# Patient Record
Sex: Male | Born: 1970 | Race: White | Hispanic: No | Marital: Married | Smoking: Former smoker
Health system: Southern US, Community
[De-identification: ages and names within clinical notes are randomized; demographics above are authoritative.]

## PROBLEM LIST (undated history)

## (undated) DIAGNOSIS — Z973 Presence of spectacles and contact lenses: Secondary | ICD-10-CM

## (undated) DIAGNOSIS — Z9289 Personal history of other medical treatment: Secondary | ICD-10-CM

## (undated) DIAGNOSIS — G4730 Sleep apnea, unspecified: Secondary | ICD-10-CM

## (undated) DIAGNOSIS — E119 Type 2 diabetes mellitus without complications: Secondary | ICD-10-CM

## (undated) DIAGNOSIS — C801 Malignant (primary) neoplasm, unspecified: Secondary | ICD-10-CM

## (undated) DIAGNOSIS — E785 Hyperlipidemia, unspecified: Secondary | ICD-10-CM

## (undated) DIAGNOSIS — M1990 Unspecified osteoarthritis, unspecified site: Secondary | ICD-10-CM

## (undated) DIAGNOSIS — C189 Malignant neoplasm of colon, unspecified: Secondary | ICD-10-CM

## (undated) DIAGNOSIS — D649 Anemia, unspecified: Secondary | ICD-10-CM

## (undated) HISTORY — PX: COLON SURGERY: SHX602

## (undated) HISTORY — PX: WISDOM TOOTH EXTRACTION: SHX21

## (undated) HISTORY — PX: TONSILLECTOMY: SUR1361

## (undated) HISTORY — DX: Hyperlipidemia, unspecified: E78.5

## (undated) HISTORY — PX: TESTICLE SURGERY: SHX794

## (undated) HISTORY — DX: Type 2 diabetes mellitus without complications: E11.9

---

## 2013-11-02 ENCOUNTER — Encounter: Payer: 59 | Attending: Family Medicine

## 2013-11-02 VITALS — Ht 74.0 in | Wt 259.0 lb

## 2013-11-02 DIAGNOSIS — E119 Type 2 diabetes mellitus without complications: Secondary | ICD-10-CM

## 2013-11-02 DIAGNOSIS — Z713 Dietary counseling and surveillance: Secondary | ICD-10-CM | POA: Insufficient documentation

## 2013-11-03 NOTE — Progress Notes (Signed)
Patient was seen on 11/02/13 for the first of a series of three diabetes self-management courses at the [facility].  Current HbA1c: 6.8%  The following learning objectives were met by the patient during this class:  Describe diabetes  State some common risk factors for diabetes  Defines the role of glucose and insulin  Identifies type of diabetes and pathophysiology  Describe the relationship between diabetes and cardiovascular risk  State the members of the Healthcare Team  States the rationale for glucose monitoring  State when to test glucose  State their individual Target Range  State the importance of logging glucose readings  Describe how to interpret glucose readings  Identifies A1C target  Explain the correlation between A1c and eAG values  State symptoms and treatment of high blood glucose  State symptoms and treatment of low blood glucose  Explain proper technique for glucose testing  Identifies proper sharps disposal  Handouts given during class include:  Living Well with Diabetes book  Carb Counting and Meal Planning book  Meal Plan Card  Carbohydrate guide  Meal planning worksheet  Low Sodium Flavoring Tips  The diabetes portion plate  O2P to eAG Conversion Chart  Diabetes Medications  Diabetes Recommended Care Schedule  Support Group  Diabetes Success Plan  Core Class Satisfaction Survey  Follow-Up Plan:  Attend core 2

## 2013-11-09 DIAGNOSIS — E119 Type 2 diabetes mellitus without complications: Secondary | ICD-10-CM

## 2013-11-09 NOTE — Progress Notes (Signed)
Patient was seen on 11/09/13 for the second of a series of three diabetes self-management courses at the [facility]. The following learning objectives were met by the patient during this class:   Describe the role of different macronutrients on glucose  Explain how carbohydrates affect blood glucose  State what foods contain the most carbohydrates  Demonstrate carbohydrate counting  Demonstrate how to read Nutrition Facts food label  Describe effects of various fats on heart health  Describe the importance of good nutrition for health and healthy eating strategies  Describe techniques for managing your shopping, cooking and meal planning  List strategies to follow meal plan when dining out  Describe the effects of alcohol on glucose and how to use it safely  Goals:  Follow Diabetes Meal Plan as instructed  Eat 3 meals and 2 snacks, every 3-5 hrs  Aim for carbohydrate intake to 60 grams carbohydrate/meal Aim for carbohydrate intake to 30 grams carbohydrate/snack Add lean protein foods to meals/snacks  Monitor glucose levels as instructed by your doctor   Follow-Up Plan:  Attend Core 3  Work towards following your personal food plan.  

## 2013-11-16 ENCOUNTER — Encounter: Payer: 59 | Attending: Family Medicine

## 2013-11-16 DIAGNOSIS — E119 Type 2 diabetes mellitus without complications: Secondary | ICD-10-CM | POA: Insufficient documentation

## 2013-11-16 DIAGNOSIS — Z713 Dietary counseling and surveillance: Secondary | ICD-10-CM | POA: Insufficient documentation

## 2013-11-16 NOTE — Progress Notes (Signed)
Patient was seen on 11/16/13 for the third of a series of three diabetes self-management courses at the [facility]. The following learning objectives were met by the patient during this class:    State the amount of activity recommended for healthy living   Describe activities suitable for individual needs   Identify ways to regularly incorporate activity into daily life   Identify barriers to activity and ways to over come these barriers  Identify diabetes medications being personally used and their primary action for lowering glucose and possible side effects   Describe role of stress on blood glucose and develop strategies to address psychosocial issues   Identify diabetes complications and ways to prevent them  Explain how to manage diabetes during illness   Evaluate success in meeting personal goal   Establish 2-3 goals that they will plan to diligently work on until they return for the  64-monthfollow-up visit  Goals:  Follow Diabetes Meal Plan as instructed  Aim for 15-30 mins of physical activity daily as tolerated  Bring food record and glucose log to your follow up visit  Your patient has established the following 4 month goals in their individualized success plan: I will increase my activity level by 30 minutes at least 5 days a week I will test my glucose at least 3 times a day, 7 days a week  Your patient has identified these potential barriers to change:  Money for test strips  Your patient has identified their diabetes self-care support plan as  [facility]  Wife  Plan:  Attend Core 4 in 4 months

## 2014-02-19 ENCOUNTER — Ambulatory Visit: Payer: 59

## 2014-12-13 ENCOUNTER — Other Ambulatory Visit: Payer: Self-pay | Admitting: Orthopaedic Surgery

## 2014-12-17 ENCOUNTER — Encounter (HOSPITAL_BASED_OUTPATIENT_CLINIC_OR_DEPARTMENT_OTHER): Payer: Self-pay | Admitting: *Deleted

## 2014-12-17 NOTE — Progress Notes (Signed)
No labs needed-will bring cpap-will use post op

## 2014-12-19 NOTE — H&P (Signed)
Scott Gallagher is an 44 y.o. male.   Chief Complaint:Right shoulder pain HPI: Scott Gallagher is a patient well known to our practice complaining of multiple months of right shoulder pain with activity.  He does get worse over the last couple of weeks.  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  He is now having trouble sleeping and pain with activities of daily living.  We have discussed proceeding with a shoulder arthroscopy to take care of this problem for him.  MRI scan was positive for a calcific deposit in the subscapularis  Past Medical History  Diagnosis Date  . Hyperlipidemia   . Sleep apnea     cpap  . Diabetes mellitus without complication     diet controlled  . Wears glasses     Past Surgical History  Procedure Laterality Date  . Tonsillectomy    . Testicle surgery      as teen  . Wisdom tooth extraction      Family History  Problem Relation Age of Onset  . Cancer Other   . Diabetes Other   . Hyperlipidemia Other   . Hypertension Other    Social History:  reports that he quit smoking about 2 years ago. He does not have any smokeless tobacco history on file. He reports that he drinks alcohol. He reports that he does not use illicit drugs.  Allergies:  Allergies  Allergen Reactions  . Penicillins     No prescriptions prior to admission    No results found for this or any previous visit (from the past 48 hour(s)). No results found.  Review of Systems  Musculoskeletal:       Right shoulder pain with motion  All other systems reviewed and are negative.   Height 6\' 2"  (1.88 m), weight 113.399 kg (250 lb). Physical Exam  Constitutional: He is oriented to person, place, and time. He appears well-nourished.  HENT:  Head: Atraumatic.  Eyes: EOM are normal.  Neck: Neck supple.  Cardiovascular: Normal rate.   Respiratory: Effort normal.  GI: Soft.  Musculoskeletal:  Right shoulder motion  remains full but he has severe pain with secondary impingement.  Good rotator cuff strength pain with resisted external rotation.  Sensory motor function normal DTRs 2+  Neurological: He is oriented to person, place, and time.  Skin: Skin is warm.  Psychiatric: He has a normal mood and affect.     Assessment/Plan Assessment right shoulder impingement and subscapularis calcific tendinitis injected in the past Plan: Because of his severe pain with use of right shoulder we have discussed proceeding with a arthroscopy/acromioplasty of his shoulder to reduce his pain.  Discussed the risk of anesthesia, infection with shoulder arthroscopy.  Also discussed is the need for possible postoperative physical therapy.  Nur Rabold R 12/19/2014, 12:22 PM

## 2014-12-20 ENCOUNTER — Ambulatory Visit (HOSPITAL_BASED_OUTPATIENT_CLINIC_OR_DEPARTMENT_OTHER): Payer: 59 | Admitting: Certified Registered"

## 2014-12-20 ENCOUNTER — Encounter (HOSPITAL_BASED_OUTPATIENT_CLINIC_OR_DEPARTMENT_OTHER)
Admission: RE | Disposition: A | Discharge status: Home / Self Care | Payer: Self-pay | Source: Ambulatory Visit | Attending: Orthopaedic Surgery

## 2014-12-20 ENCOUNTER — Encounter (HOSPITAL_BASED_OUTPATIENT_CLINIC_OR_DEPARTMENT_OTHER): Payer: Self-pay | Admitting: Anesthesiology

## 2014-12-20 ENCOUNTER — Ambulatory Visit (HOSPITAL_BASED_OUTPATIENT_CLINIC_OR_DEPARTMENT_OTHER)
Admission: RE | Admit: 2014-12-20 | Discharge: 2014-12-20 | Disposition: A | Discharge status: Home / Self Care | Payer: 59 | Source: Ambulatory Visit | Attending: Orthopaedic Surgery | Admitting: Orthopaedic Surgery

## 2014-12-20 DIAGNOSIS — M7531 Calcific tendinitis of right shoulder: Secondary | ICD-10-CM | POA: Diagnosis present

## 2014-12-20 DIAGNOSIS — E119 Type 2 diabetes mellitus without complications: Secondary | ICD-10-CM | POA: Diagnosis not present

## 2014-12-20 DIAGNOSIS — Z880 Allergy status to penicillin: Secondary | ICD-10-CM | POA: Diagnosis not present

## 2014-12-20 DIAGNOSIS — Z6832 Body mass index (BMI) 32.0-32.9, adult: Secondary | ICD-10-CM | POA: Insufficient documentation

## 2014-12-20 DIAGNOSIS — Z87891 Personal history of nicotine dependence: Secondary | ICD-10-CM | POA: Diagnosis not present

## 2014-12-20 DIAGNOSIS — E785 Hyperlipidemia, unspecified: Secondary | ICD-10-CM | POA: Diagnosis not present

## 2014-12-20 DIAGNOSIS — G4730 Sleep apnea, unspecified: Secondary | ICD-10-CM | POA: Diagnosis not present

## 2014-12-20 DIAGNOSIS — M7541 Impingement syndrome of right shoulder: Secondary | ICD-10-CM | POA: Diagnosis not present

## 2014-12-20 HISTORY — DX: Sleep apnea, unspecified: G47.30

## 2014-12-20 HISTORY — PX: SHOULDER ARTHROSCOPY: SHX128

## 2014-12-20 HISTORY — PX: SHOULDER ACROMIOPLASTY: SHX6093

## 2014-12-20 HISTORY — DX: Presence of spectacles and contact lenses: Z97.3

## 2014-12-20 LAB — POCT HEMOGLOBIN-HEMACUE: Hemoglobin: 14.3 g/dL (ref 13.0–17.0)

## 2014-12-20 SURGERY — ARTHROSCOPY, SHOULDER
Anesthesia: Regional | Site: Shoulder | Laterality: Right

## 2014-12-20 MED ORDER — FENTANYL CITRATE (PF) 100 MCG/2ML IJ SOLN: 25.0000 ug | INTRAMUSCULAR | Status: DC | PRN

## 2014-12-20 MED ORDER — ONDANSETRON HCL 4 MG/2ML IJ SOLN
INTRAMUSCULAR | Status: DC | PRN
  Administered 2014-12-20: 4 mg via INTRAVENOUS

## 2014-12-20 MED ORDER — MIDAZOLAM HCL 2 MG/2ML IJ SOLN
INTRAMUSCULAR | Status: AC
  Filled 2014-12-20: qty 2

## 2014-12-20 MED ORDER — LACTATED RINGERS IV SOLN
INTRAVENOUS | Status: DC
  Administered 2014-12-20: 13:00:00 via INTRAVENOUS

## 2014-12-20 MED ORDER — ACETAMINOPHEN 10 MG/ML IV SOLN
1000.0000 mg | Freq: Once | INTRAVENOUS | Status: AC
  Administered 2014-12-20: 1000 mg via INTRAVENOUS

## 2014-12-20 MED ORDER — ROPIVACAINE HCL 5 MG/ML IJ SOLN
INTRAMUSCULAR | Status: DC | PRN
  Administered 2014-12-20: 20 mL via PERINEURAL

## 2014-12-20 MED ORDER — ACETAMINOPHEN 160 MG/5ML PO SOLN: 325.0000 mg | ORAL | Status: DC | PRN

## 2014-12-20 MED ORDER — FENTANYL CITRATE (PF) 100 MCG/2ML IJ SOLN
50.0000 ug | INTRAMUSCULAR | Status: DC | PRN
  Administered 2014-12-20: 100 ug via INTRAVENOUS

## 2014-12-20 MED ORDER — MIDAZOLAM HCL 2 MG/2ML IJ SOLN
1.0000 mg | INTRAMUSCULAR | Status: DC | PRN
  Administered 2014-12-20: 2 mg via INTRAVENOUS

## 2014-12-20 MED ORDER — HYDROCODONE-ACETAMINOPHEN 5-325 MG PO TABS: 1.0000 | ORAL_TABLET | Freq: Four times a day (QID) | ORAL | Status: DC | PRN

## 2014-12-20 MED ORDER — SUCCINYLCHOLINE CHLORIDE 20 MG/ML IJ SOLN
INTRAMUSCULAR | Status: DC | PRN
  Administered 2014-12-20: 50 mg via INTRAVENOUS

## 2014-12-20 MED ORDER — LACTATED RINGERS IV SOLN
INTRAVENOUS | Status: DC
  Administered 2014-12-20: 12:00:00 via INTRAVENOUS

## 2014-12-20 MED ORDER — FENTANYL CITRATE (PF) 100 MCG/2ML IJ SOLN
INTRAMUSCULAR | Status: AC
  Filled 2014-12-20: qty 2

## 2014-12-20 MED ORDER — ACETAMINOPHEN 10 MG/ML IV SOLN
INTRAVENOUS | Status: AC
  Filled 2014-12-20: qty 100

## 2014-12-20 MED ORDER — METHYLPREDNISOLONE ACETATE 80 MG/ML IJ SUSP
INTRAMUSCULAR | Status: AC
  Filled 2014-12-20: qty 1

## 2014-12-20 MED ORDER — VANCOMYCIN HCL IN DEXTROSE 1-5 GM/200ML-% IV SOLN
INTRAVENOUS | Status: AC
  Filled 2014-12-20: qty 200

## 2014-12-20 MED ORDER — ACETAMINOPHEN 325 MG PO TABS: 325.0000 mg | ORAL_TABLET | ORAL | Status: DC | PRN

## 2014-12-20 MED ORDER — VANCOMYCIN HCL 10 G IV SOLR
1500.0000 mg | INTRAVENOUS | Status: AC
  Administered 2014-12-20: 1500 mg via INTRAVENOUS

## 2014-12-20 MED ORDER — DEXAMETHASONE SODIUM PHOSPHATE 4 MG/ML IJ SOLN
INTRAMUSCULAR | Status: DC | PRN
  Administered 2014-12-20: 10 mg via INTRAVENOUS
  Administered 2014-12-20: 4 mg via PERINEURAL

## 2014-12-20 MED ORDER — VANCOMYCIN HCL IN DEXTROSE 500-5 MG/100ML-% IV SOLN
INTRAVENOUS | Status: AC
  Filled 2014-12-20: qty 100

## 2014-12-20 MED ORDER — CHLORHEXIDINE GLUCONATE 4 % EX LIQD: 60.0000 mL | Freq: Once | CUTANEOUS | Status: DC

## 2014-12-20 MED ORDER — PROPOFOL 10 MG/ML IV BOLUS
INTRAVENOUS | Status: DC | PRN
  Administered 2014-12-20: 200 mg via INTRAVENOUS

## 2014-12-20 MED ORDER — BUPIVACAINE-EPINEPHRINE (PF) 0.5% -1:200000 IJ SOLN
INTRAMUSCULAR | Status: AC
  Filled 2014-12-20: qty 30

## 2014-12-20 MED ORDER — LIDOCAINE HCL (CARDIAC) 20 MG/ML IV SOLN
INTRAVENOUS | Status: DC | PRN
  Administered 2014-12-20: 60 mg via INTRAVENOUS

## 2014-12-20 MED ORDER — SODIUM CHLORIDE 0.9 % IR SOLN
Status: DC | PRN
  Administered 2014-12-20: 6000 mL

## 2014-12-20 SURGICAL SUPPLY — 63 items
BENZOIN TINCTURE PRP APPL 2/3 (GAUZE/BANDAGES/DRESSINGS) IMPLANT
BLADE CUDA 5.5 (BLADE) IMPLANT
BLADE GREAT WHITE 4.2 (BLADE) ×2 IMPLANT
BLADE SURG 15 STRL LF DISP TIS (BLADE) IMPLANT
BLADE SURG 15 STRL SS (BLADE)
BUR VERTEX HOODED 4.5 (BURR) ×2 IMPLANT
CANNULA SHOULDER 7CM (CANNULA) ×2 IMPLANT
CANNULA TWIST IN 8.25X7CM (CANNULA) IMPLANT
DECANTER SPIKE VIAL GLASS SM (MISCELLANEOUS) IMPLANT
DRAPE STERI 35X30 U-POUCH (DRAPES) ×2 IMPLANT
DRAPE U-SHAPE 47X51 STRL (DRAPES) ×2 IMPLANT
DRAPE U-SHAPE 76X120 STRL (DRAPES) ×4 IMPLANT
DRSG EMULSION OIL 3X3 NADH (GAUZE/BANDAGES/DRESSINGS) ×2 IMPLANT
DRSG PAD ABDOMINAL 8X10 ST (GAUZE/BANDAGES/DRESSINGS) ×2 IMPLANT
DURAPREP 26ML APPLICATOR (WOUND CARE) ×2 IMPLANT
ELECT MENISCUS 165MM 90D (ELECTRODE) IMPLANT
ELECT REM PT RETURN 9FT ADLT (ELECTROSURGICAL) ×2
ELECTRODE REM PT RTRN 9FT ADLT (ELECTROSURGICAL) ×1 IMPLANT
GAUZE SPONGE 4X4 12PLY STRL (GAUZE/BANDAGES/DRESSINGS) ×2 IMPLANT
GLOVE BIO SURGEON STRL SZ8 (GLOVE) ×4 IMPLANT
GLOVE BIOGEL M STRL SZ7.5 (GLOVE) ×2 IMPLANT
GLOVE BIOGEL PI IND STRL 8 (GLOVE) ×4 IMPLANT
GLOVE BIOGEL PI INDICATOR 8 (GLOVE) ×4
GOWN STRL REUS W/ TWL LRG LVL3 (GOWN DISPOSABLE) IMPLANT
GOWN STRL REUS W/ TWL XL LVL3 (GOWN DISPOSABLE) ×3 IMPLANT
GOWN STRL REUS W/TWL LRG LVL3 (GOWN DISPOSABLE)
GOWN STRL REUS W/TWL XL LVL3 (GOWN DISPOSABLE) ×3
LIQUID BAND (GAUZE/BANDAGES/DRESSINGS) IMPLANT
MANIFOLD NEPTUNE II (INSTRUMENTS) ×2 IMPLANT
NDL SUT 6 .5 CRC .975X.05 MAYO (NEEDLE) IMPLANT
NEEDLE MAYO TAPER (NEEDLE)
NEEDLE SCORPION MULTI FIRE (NEEDLE) IMPLANT
NS IRRIG 1000ML POUR BTL (IV SOLUTION) IMPLANT
PACK ARTHROSCOPY DSU (CUSTOM PROCEDURE TRAY) ×2 IMPLANT
PACK BASIN DAY SURGERY FS (CUSTOM PROCEDURE TRAY) ×2 IMPLANT
PASSER SUT SWANSON 36MM LOOP (INSTRUMENTS) IMPLANT
PENCIL BUTTON HOLSTER BLD 10FT (ELECTRODE) IMPLANT
SET ARTHROSCOPY TUBING (MISCELLANEOUS) ×1
SET ARTHROSCOPY TUBING LN (MISCELLANEOUS) ×1 IMPLANT
SHEET MEDIUM DRAPE 40X70 STRL (DRAPES) ×2 IMPLANT
SLEEVE SCD COMPRESS KNEE MED (MISCELLANEOUS) ×2 IMPLANT
SLING ARM LRG ADULT FOAM STRAP (SOFTGOODS) ×2 IMPLANT
SLING ARM MED ADULT FOAM STRAP (SOFTGOODS) IMPLANT
SLING ARM SM FOAM STRAP (SOFTGOODS) IMPLANT
SLING ARM XL FOAM STRAP (SOFTGOODS) IMPLANT
SPONGE LAP 4X18 X RAY DECT (DISPOSABLE) IMPLANT
STRIP CLOSURE SKIN 1/2X4 (GAUZE/BANDAGES/DRESSINGS) IMPLANT
SUCTION FRAZIER TIP 10 FR DISP (SUCTIONS) IMPLANT
SUT ETHIBOND 2 OS 4 DA (SUTURE) IMPLANT
SUT ETHILON 3 0 PS 1 (SUTURE) ×2 IMPLANT
SUT FIBERWIRE #2 38 T-5 BLUE (SUTURE)
SUT PDS AB 2-0 CT2 27 (SUTURE) IMPLANT
SUT VIC AB 0 SH 27 (SUTURE) IMPLANT
SUT VIC AB 2-0 SH 27 (SUTURE)
SUT VIC AB 2-0 SH 27XBRD (SUTURE) IMPLANT
SUT VICRYL 4-0 PS2 18IN ABS (SUTURE) IMPLANT
SUTURE FIBERWR #2 38 T-5 BLUE (SUTURE) IMPLANT
SYR BULB 3OZ (MISCELLANEOUS) IMPLANT
TOWEL OR 17X24 6PK STRL BLUE (TOWEL DISPOSABLE) ×2 IMPLANT
TOWEL OR NON WOVEN STRL DISP B (DISPOSABLE) ×2 IMPLANT
WAND STAR VAC 90 (SURGICAL WAND) ×2 IMPLANT
WATER STERILE IRR 1000ML POUR (IV SOLUTION) ×2 IMPLANT
YANKAUER SUCT BULB TIP NO VENT (SUCTIONS) IMPLANT

## 2014-12-20 NOTE — Anesthesia Postprocedure Evaluation (Signed)
  Anesthesia Post-op Note  Patient: Scott Gallagher  Procedure(s) Performed: Procedure(s): RIGHT ARTHROSCOPY SHOULDER WITH DEBRIDEMENT (Right) SHOULDER ACROMIOPLASTY (Right)  Patient Location: PACU  Anesthesia Type:General and Regional  Level of Consciousness: awake  Airway and Oxygen Therapy: Patient Spontanous Breathing  Post-op Pain: none  Post-op Assessment: Post-op Vital signs reviewed, Patient's Cardiovascular Status Stable, Respiratory Function Stable, Patent Airway, No signs of Nausea or vomiting and Pain level controlled  Post-op Vital Signs: Reviewed and stable  Last Vitals:  Filed Vitals:   12/20/14 1522  BP: 122/77  Pulse: 81  Temp:   Resp: 17    Complications: No apparent anesthesia complications

## 2014-12-20 NOTE — Transfer of Care (Signed)
Immediate Anesthesia Transfer of Care Note  Patient: Scott Gallagher  Procedure(s) Performed: Procedure(s): RIGHT ARTHROSCOPY SHOULDER WITH DEBRIDEMENT (Right) SHOULDER ACROMIOPLASTY (Right)  Patient Location: PACU  Anesthesia Type:GA combined with regional for post-op pain  Level of Consciousness: sedated and patient cooperative  Airway & Oxygen Therapy: Patient Spontanous Breathing and Patient connected to face mask oxygen  Post-op Assessment: Report given to RN and Post -op Vital signs reviewed and stable  Post vital signs: Reviewed and stable  Last Vitals:  Filed Vitals:   12/20/14 1323  BP:   Pulse: 85  Temp:   Resp: 19    Complications: No apparent anesthesia complications

## 2014-12-20 NOTE — Discharge Instructions (Signed)
°  Post Anesthesia Home Care Instructions  Activity: Get plenty of rest for the remainder of the day. A responsible adult should stay with you for 24 hours following the procedure.  For the next 24 hours, DO NOT: -Drive a car -Paediatric nurse -Drink alcoholic beverages -Take any medication unless instructed by your physician -Make any legal decisions or sign important papers.  Meals: Start with liquid foods such as gelatin or soup. Progress to regular foods as tolerated. Avoid greasy, spicy, heavy foods. If nausea and/or vomiting occur, drink only clear liquids until the nausea and/or vomiting subsides. Call your physician if vomiting continues.  Special Instructions/Symptoms: Your throat may feel dry or sore from the anesthesia or the breathing tube placed in your throat during surgery. If this causes discomfort, gargle with warm salt water. The discomfort should disappear within 24 hours.  If you had a scopolamine patch placed behind your ear for the management of post- operative nausea and/or vomiting:  1. The medication in the patch is effective for 72 hours, after which it should be removed.  Wrap patch in a tissue and discard in the trash. Wash hands thoroughly with soap and water. 2. You may remove the patch earlier than 72 hours if you experience unpleasant side effects which may include dry mouth, dizziness or visual disturbances. 3. Avoid touching the patch. Wash your hands with soap and water after contact with the patch.   Discharge Instructions After Orthopedic Procedures:  *You may feel tired and weak following your procedure. It is recommended that you limit physical activity for the next 24 hours and rest at home for the remainder of today and tomorrow. *No strenuous activity should be started without your doctor's permission.  Elevate the extremity that you had surgery on to a level above your heart. This should continue for 48 hours or as instructed by your  doctor.  If you had hand, arm or shoulder surgery you should move your fingers frequently unless otherwise instructed by your doctor.  If you had foot, knee or leg surgery you should wiggle your toes frequently unless otherwise instructed by your doctor.  Follow your doctor's exact instructions for activity at home. Use your home equipment as instructed. (Crutches, hard shoes, slings etc.)  Limit your activity as instructed by your doctor.  Report to your doctor should any of the following occur: 1. Extreme swelling of your fingers or toes. 2. Inability to wiggle your fingers or toes. 3. Coldness, pale or bluish color in your fingers or toes. 4. Loss of sensation, numbness or tingling of your fingers or toes. 5. Unusual smell or odor from under your dressing or cast. 6. Excessive bleeding or drainage from the surgical site. 7. Pain not relieved by medication your doctor has prescribed for you. 8. Cast or dressing too tight (do not get your dressing or cast wet or put anything under          your dressing or cast.)  *Do not change your dressing unless instructed by your doctor or discharge nurse. Then follow exact instructions.  *Follow labeled instructions for any medications that your doctor may have prescribed for you. *Should any questions or complications develop following your procedure, PLEASE CONTACT YOUR DOCTOR.

## 2014-12-20 NOTE — Progress Notes (Signed)
AssistedDr. Moser with right, ultrasound guided, interscalene  block. Side rails up, monitors on throughout procedure. See vital signs in flow sheet. Tolerated Procedure well.  

## 2014-12-20 NOTE — Interval H&P Note (Signed)
OK for surgery PD 

## 2014-12-20 NOTE — Op Note (Signed)
#  [id number] 

## 2014-12-20 NOTE — Anesthesia Preprocedure Evaluation (Signed)
Anesthesia Evaluation  Patient identified by MRN, date of birth, ID band Patient awake    Reviewed: Allergy & Precautions, NPO status , Patient's Chart, lab work & pertinent test results  History of Anesthesia Complications Negative for: history of anesthetic complications  Airway Mallampati: III  TM Distance: >3 FB Neck ROM: Full    Dental  (+) Teeth Intact   Pulmonary sleep apnea and Continuous Positive Airway Pressure Ventilation , former smoker,  breath sounds clear to auscultation        Cardiovascular negative cardio ROS  Rhythm:Regular     Neuro/Psych negative neurological ROS  negative psych ROS   GI/Hepatic negative GI ROS, Neg liver ROS,   Endo/Other  diabetes, Type 2Morbid obesity  Renal/GU negative Renal ROS     Musculoskeletal   Abdominal   Peds  Hematology negative hematology ROS (+)   Anesthesia Other Findings   Reproductive/Obstetrics                             Anesthesia Physical Anesthesia Plan  ASA: II  Anesthesia Plan: General and Regional   Post-op Pain Management:    Induction: Intravenous  Airway Management Planned: Oral ETT  Additional Equipment: None  Intra-op Plan:   Post-operative Plan: Extubation in OR  Informed Consent: I have reviewed the patients History and Physical, chart, labs and discussed the procedure including the risks, benefits and alternatives for the proposed anesthesia with the patient or authorized representative who has indicated his/her understanding and acceptance.   Dental advisory given  Plan Discussed with: CRNA and Surgeon  Anesthesia Plan Comments:         Anesthesia Quick Evaluation

## 2014-12-20 NOTE — Anesthesia Procedure Notes (Addendum)
Anesthesia Regional Block:  Interscalene brachial plexus block  Pre-Anesthetic Checklist: ,, timeout performed, Correct Patient, Correct Site, Correct Laterality, Correct Procedure, Correct Position, site marked, Risks and benefits discussed,  Surgical consent,  Pre-op evaluation,  At surgeon's request and post-op pain management  Laterality: Upper and Right  Prep: chloraprep       Needles:  Injection technique: Single-shot  Needle Type: Echogenic Stimulator Needle          Additional Needles:  Procedures: ultrasound guided (picture in chart) and nerve stimulator Interscalene brachial plexus block  Nerve Stimulator or Paresthesia:  Response: deltoid, 0.5 mA,   Additional Responses:   Narrative:  Injection made incrementally with aspirations every 5 mL.  Performed by: Personally  Anesthesiologist: Pavlos Yon, CHRIS  Additional Notes: H+P and labs reviewed, risks and benefits discussed with patient, procedure tolerated well without complications   Procedure Name: Intubation Performed by: Tawni Millers Pre-anesthesia Checklist: Patient identified, Emergency Drugs available, Suction available and Patient being monitored Patient Re-evaluated:Patient Re-evaluated prior to inductionOxygen Delivery Method: Circle System Utilized Preoxygenation: Pre-oxygenation with 100% oxygen Intubation Type: IV induction Ventilation: Mask ventilation without difficulty Laryngoscope Size: Mac and 3 Tube type: Oral Tube size: 8.0 mm Number of attempts: 1 Airway Equipment and Method: Stylet and Oral airway Placement Confirmation: ETT inserted through vocal cords under direct vision,  positive ETCO2 and breath sounds checked- equal and bilateral Tube secured with: Tape Dental Injury: Teeth and Oropharynx as per pre-operative assessment

## 2014-12-21 ENCOUNTER — Encounter (HOSPITAL_BASED_OUTPATIENT_CLINIC_OR_DEPARTMENT_OTHER): Payer: Self-pay | Admitting: Orthopaedic Surgery

## 2014-12-21 NOTE — Op Note (Signed)
[name], Scott Gallagher               ACCOUNT NO.:  [id number]  MEDICAL RECORD NO.:  [id number]  LOCATION:                                FACILITY:  [facility]  PHYSICIAN:  Monico Blitz. Elin Seats, M.D.DATE OF BIRTH:  [birth year]  DATE OF PROCEDURE:  12/20/2014 DATE OF DISCHARGE:  12/20/2014                              OPERATIVE REPORT   PREOPERATIVE DIAGNOSES: 1. Right shoulder calcific tendonitis. 2. Right shoulder impingement.  POSTOPERATIVE DIAGNOSES: 1. Right shoulder calcific tendonitis. 2. Right shoulder impingement.  PROCEDURE: 1. Right shoulder arthroscopic debridement. 2. Right shoulder arthroscopic acromioplasty.  ANESTHESIA:  General and block.  ATTENDING SURGEON:  Monico Blitz. Rhona Raider, M.D.  ASSISTANT:  Loni Dolly, PA-C.  INDICATION FOR PROCEDURE:  The patient is a 44 year old man we have followed for a long time  with some tear of right shoulder pain.  He initially responded to an injection, but with a recent recurrence things have not gotten better.  We know that he has a calcific deposit in his subscapularis.  He has pain which limits his ability to rest and use his arm and at this point, he is offered an arthroscopy.  Informed operative consent was obtained after discussion of possible complications including reaction to anesthesia and infection.  SUMMARY OF FINDINGS AND PROCEDURE:  Under general anesthesia and a block, a right shoulder arthroscopy was performed.  Glenohumeral joint showed no degenerative changes in the biceps tendon and rotator cuff looked benign.  In the subacromial space, he had a moderately prominent subacromial morphology addressed with an acromioplasty.  We then probed the subscapularis and found a calcific deposit which we did our best to drain through a small.  He was closed primarily and discharged home.  DESCRIPTION OF PROCEDURE:  The patient was taken to the operating suite where general anesthetic was applied without difficulty.  He  was positioned in a beach-chair position and prepped and draped in normal sterile fashion.  After the administration of preop IV Kefzol and an appropriate time-out, an arthroscopy of the right shoulder was performed through a total of 3 portals.  Findings were as noted above and procedure consisted initially of the acromioplasty.  This was done with a bur in the lateral position followed by transfer of the bur to the posterior position.  We then probed to the subscapularis and found a calcium deposit.  I made a split with a spinal needle along the same direction as the tendon fibers.  We then expressed some thick chalky substance consistent with a calcific deposit.  We milked him as much of this out as we could.  The shoulder was thoroughly irrigated followed by reapproximation of portals loosely with nylon.  Adaptic was applied followed by dry gauze and tape.  Estimated blood loss and intraoperative fluids can be obtained from anesthesia records.  DISPOSITION:  The patient was extubated in the operating room and taken to recovery room in stable condition.  Plans were for him to go home same-day and follow up in the office closely.  I will contact her by phone tonight.     Monico Blitz Rhona Raider, M.D.     PGD/MEDQ  D:  12/20/2014  T:  12/21/2014  Job:  [id number]

## 2016-05-21 ENCOUNTER — Observation Stay (HOSPITAL_COMMUNITY)
Admission: EM | Admit: 2016-05-21 | Discharge: 2016-05-24 | Disposition: A | Discharge status: Home / Self Care | Payer: 59 | Attending: Family Medicine | Admitting: Family Medicine

## 2016-05-21 ENCOUNTER — Encounter (HOSPITAL_COMMUNITY): Payer: Self-pay | Admitting: Emergency Medicine

## 2016-05-21 DIAGNOSIS — Z87891 Personal history of nicotine dependence: Secondary | ICD-10-CM | POA: Diagnosis not present

## 2016-05-21 DIAGNOSIS — E785 Hyperlipidemia, unspecified: Secondary | ICD-10-CM | POA: Diagnosis present

## 2016-05-21 DIAGNOSIS — Z79899 Other long term (current) drug therapy: Secondary | ICD-10-CM | POA: Diagnosis not present

## 2016-05-21 DIAGNOSIS — D500 Iron deficiency anemia secondary to blood loss (chronic): Secondary | ICD-10-CM | POA: Diagnosis present

## 2016-05-21 DIAGNOSIS — E119 Type 2 diabetes mellitus without complications: Secondary | ICD-10-CM

## 2016-05-21 DIAGNOSIS — D649 Anemia, unspecified: Principal | ICD-10-CM | POA: Insufficient documentation

## 2016-05-21 DIAGNOSIS — D61810 Antineoplastic chemotherapy induced pancytopenia: Secondary | ICD-10-CM | POA: Diagnosis present

## 2016-05-21 DIAGNOSIS — G4733 Obstructive sleep apnea (adult) (pediatric): Secondary | ICD-10-CM | POA: Diagnosis not present

## 2016-05-21 DIAGNOSIS — D509 Iron deficiency anemia, unspecified: Secondary | ICD-10-CM

## 2016-05-21 DIAGNOSIS — R42 Dizziness and giddiness: Secondary | ICD-10-CM | POA: Diagnosis present

## 2016-05-21 LAB — CBC
HCT: 22.2 % — ABNORMAL LOW (ref 39.0–52.0)
HEMOGLOBIN: 5.6 g/dL — AB (ref 13.0–17.0)
MCH: 16.3 pg — ABNORMAL LOW (ref 26.0–34.0)
MCHC: 25.2 g/dL — ABNORMAL LOW (ref 30.0–36.0)
MCV: 64.7 fL — AB (ref 78.0–100.0)
PLATELETS: 478 10*3/uL — AB (ref 150–400)
RBC: 3.43 MIL/uL — ABNORMAL LOW (ref 4.22–5.81)
RDW: 18.9 % — ABNORMAL HIGH (ref 11.5–15.5)
WBC: 7.4 10*3/uL (ref 4.0–10.5)

## 2016-05-21 LAB — COMPREHENSIVE METABOLIC PANEL
ALT: 17 U/L (ref 17–63)
AST: 20 U/L (ref 15–41)
Albumin: 4.7 g/dL (ref 3.5–5.0)
Alkaline Phosphatase: 60 U/L (ref 38–126)
Anion gap: 6 (ref 5–15)
BUN: 18 mg/dL (ref 6–20)
CO2: 25 mmol/L (ref 22–32)
CREATININE: 1 mg/dL (ref 0.61–1.24)
Calcium: 9.3 mg/dL (ref 8.9–10.3)
Chloride: 110 mmol/L (ref 101–111)
GFR calc non Af Amer: >60 mL/min (ref >60)
Glucose, Bld: 88 mg/dL (ref 65–99)
POTASSIUM: 3.9 mmol/L (ref 3.5–5.1)
SODIUM: 141 mmol/L (ref 135–145)
Total Bilirubin: 0.7 mg/dL (ref 0.3–1.2)
Total Protein: 7.5 g/dL (ref 6.5–8.1)

## 2016-05-21 LAB — POC OCCULT BLOOD, ED: Fecal Occult Bld: NEGATIVE

## 2016-05-21 LAB — ABO/RH: ABO/RH(D): B POS

## 2016-05-21 LAB — PREPARE RBC (CROSSMATCH)

## 2016-05-21 MED ORDER — ONDANSETRON HCL 4 MG/2ML IJ SOLN
4.0000 mg | Freq: Four times a day (QID) | INTRAMUSCULAR | Status: DC | PRN
Start: 2016-05-21 — End: 2016-05-24

## 2016-05-21 MED ORDER — ACETAMINOPHEN 325 MG PO TABS: 650.0000 mg | ORAL_TABLET | Freq: Four times a day (QID) | ORAL | Status: DC | PRN

## 2016-05-21 MED ORDER — SODIUM CHLORIDE 0.9 % IV SOLN: 10.0000 mL/h | Freq: Once | INTRAVENOUS | Status: DC

## 2016-05-21 MED ORDER — ONDANSETRON HCL 4 MG PO TABS: 4.0000 mg | ORAL_TABLET | Freq: Four times a day (QID) | ORAL | Status: DC | PRN

## 2016-05-21 MED ORDER — ADULT MULTIVITAMIN W/MINERALS CH
1.0000 | ORAL_TABLET | Freq: Every day | ORAL | Status: DC
  Administered 2016-05-22 – 2016-05-24 (×3): 1 via ORAL
  Filled 2016-05-21 (×3): qty 1

## 2016-05-21 MED ORDER — ACETAMINOPHEN 650 MG RE SUPP: 650.0000 mg | Freq: Four times a day (QID) | RECTAL | Status: DC | PRN

## 2016-05-21 MED ORDER — ATORVASTATIN CALCIUM 40 MG PO TABS
40.0000 mg | ORAL_TABLET | Freq: Every day | ORAL | Status: DC
  Administered 2016-05-22 – 2016-05-23 (×2): 40 mg via ORAL
  Filled 2016-05-21 (×2): qty 1

## 2016-05-21 NOTE — ED Triage Notes (Addendum)
Pt sent over by PCP for low Hgb. Pt was at office for routine physical. Pt denies SOB, lightheadedness or weakness. Does not appear to be abnormal. No dark tarry stools.

## 2016-05-21 NOTE — H&P (Signed)
History and Physical    Benoit Pociask [id number] DOB: Dec 30, 1970 DOA: 05/21/2016  PCP: Gara Kroner, MD Consultants:  None Patient coming from: home - lives with wife and son  Chief Complaint: sent by PCP  HPI: Scott Gallagher is a 45 y.o. male with medical history significant of DM, OSA, HLD presenting after he was sent by his PCP for anemia.  He reports that he went to see Dr. Moreen Fowler about a month ago because he was getting light-headed and could feel his heart in his head and in his neck at just the slightest thing - bending over, going up stairs.  Symptoms came on suddenly.  Dr. Moreen Fowler thought it was probably an ear infection, gave Flonase, no further evaluation.  Symptoms have continued but haven't really worsened.  They aren't bad - bad enough to go see someone but never felt like he would fall over.  Routine physical which was scheduled 6 months ago was today.  Doctor called today at 3 and told him to come to the ER.  No blood in stool.  No episodes of bleeding.  Maybe harder than usual to walk for 30 minutes at a good pace - which he attributed to not walking as often as he should.  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  Started on Celebrex 2 weeks ago - but denies abdominal pain or change in symptoms since starting it.   ED Course:  Per Dr. Laverta Baltimore: Patient resents to the emergency department for evaluation of anemia of unknown origin. He has had mild symptoms over the last month that is not acutely worsen. Repeat hemoglobin here in the emergency department is 5.6. Brown stool on exam. Sending Hemoccult now. Consented patient for blood transfusion.   Discussed case with hospitalist. PRBC ordered along with anemia labs. See critical care billing note above with patient requiring blood transfusion in the ED with symptomatic anemia. Reviewed available labs.   Review of Systems: As per HPI; otherwise 10 point review of systems reviewed and negative.   Ambulatory Status:  Ambulates  without difficulty  Past Medical History:  Diagnosis Date  . Diabetes mellitus without complication (HCC)    diet controlled  . Hyperlipidemia   . Sleep apnea    cpap  . Wears glasses     Past Surgical History:  Procedure Laterality Date  . SHOULDER ACROMIOPLASTY Right 12/20/2014   Procedure: SHOULDER ACROMIOPLASTY;  Surgeon: Melrose Nakayama, MD;  Location: [place];  Service: Orthopedics;  Laterality: Right;  . SHOULDER ARTHROSCOPY Right 12/20/2014   Procedure: RIGHT ARTHROSCOPY SHOULDER WITH DEBRIDEMENT;  Surgeon: Melrose Nakayama, MD;  Location: [place];  Service: Orthopedics;  Laterality: Right;  . TESTICLE SURGERY     as teen  . TONSILLECTOMY    . WISDOM TOOTH EXTRACTION      Social History   Social History  . Marital status: Married    Spouse name: N/A  . Number of children: N/A  . Years of education: N/A   Occupational History  . cemetery maintenance    Social History Main Topics  . Smoking status: Former Smoker    Quit date: 12/16/2012  . Smokeless tobacco: Never Used  . Alcohol use 1.8 oz/week    3 Shots of liquor per week     Comment: most days  . Drug use: No  . Sexual activity: Not on file   Other Topics Concern  . Not on file   Social History Narrative  . No  narrative on file    Allergies  Allergen Reactions  . Penicillins     Family History  Problem Relation Age of Onset  . Cancer Other   . Diabetes Other   . Hyperlipidemia Other   . Hypertension Other     Prior to Admission medications   Medication Sig Start Date End Date Taking? Authorizing Provider  aspirin EC 81 MG tablet Take 81 mg by mouth daily.   Yes Historical Provider, MD  atorvastatin (LIPITOR) 40 MG tablet Take 40 mg by mouth daily.   Yes Historical Provider, MD  celecoxib (CELEBREX) 200 MG capsule Take 200 mg by mouth daily.  04/27/16  Yes Historical Provider, MD  Multiple Vitamins-Minerals (MULTIVITAMIN ADULTS) TABS Take 1 tablet by mouth daily.    Yes Historical Provider, MD  HYDROcodone-acetaminophen (NORCO) 5-325 MG per tablet Take 1-2 tablets by mouth every 6 (six) hours as needed for moderate pain. Patient not taking: Reported on 05/21/2016 12/20/14   Loni Dolly, PA-C    Physical Exam: Vitals:   05/21/16 2035 05/21/16 2059 05/21/16 2145 05/22/16 0032  BP: 125/78 132/73 (!) 153/74 126/72  Pulse: 84 76 81 81  Resp: 17 17 16 16   Temp: 98.6 F (37 C) 98.5 F (36.9 C) 98.5 F (36.9 C) 98.4 F (36.9 C)  TempSrc: Oral Oral Oral Oral  SpO2: 100% 100% 100% 100%  Weight:   111.4 kg (245 lb 9.5 oz)   Height:   6\' 2"  (1.88 m)      General: Appears calm and comfortable and is NAD Eyes: PERRL, EOMI, normal lids, iris; +conjunctival pallor ENT:  grossly normal hearing, lips & tongue, mmm Neck:  no LAD, masses or thyromegaly Cardiovascular:  RRR, no m/r/g. No LE edema.  Respiratory:  CTA bilaterally, no w/r/r. Normal respiratory effort. Abdomen:  soft, ntnd, NABS Skin:  no rash or induration seen on limited exam Musculoskeletal:  grossly normal tone BUE/BLE, good ROM, no bony abnormality Psychiatric:  grossly normal mood and affect, speech fluent and appropriate, AOx3 Neurologic:  CN 2-12 grossly intact, moves all extremities in coordinated fashion, sensation intact  Labs on Admission: I have personally reviewed following labs and imaging studies  CBC:  Recent Labs Lab 05/21/16 1637  WBC 7.4  HGB 5.6*  HCT 22.2*  MCV 64.7*  PLT 123456*   Basic Metabolic Panel:  Recent Labs Lab 05/21/16 1637  NA 141  K 3.9  CL 110  CO2 25  GLUCOSE 88  BUN 18  CREATININE 1.00  CALCIUM 9.3   GFR: Estimated Creatinine Clearance: 122.6 mL/min (by C-G formula based on SCr of 1 mg/dL). Liver Function Tests:  Recent Labs Lab 05/21/16 1637  AST 20  ALT 17  ALKPHOS 60  BILITOT 0.7  PROT 7.5  ALBUMIN 4.7   No results for input(s): LIPASE, AMYLASE in the last 168 hours. No results for input(s): AMMONIA in the last 168  hours. Coagulation Profile: No results for input(s): INR, PROTIME in the last 168 hours. Cardiac Enzymes: No results for input(s): CKTOTAL, CKMB, CKMBINDEX, TROPONINI in the last 168 hours. BNP (last 3 results) No results for input(s): PROBNP in the last 8760 hours. HbA1C: No results for input(s): HGBA1C in the last 72 hours. CBG: No results for input(s): GLUCAP in the last 168 hours. Lipid Profile: No results for input(s): CHOL, HDL, LDLCALC, TRIG, CHOLHDL, LDLDIRECT in the last 72 hours. Thyroid Function Tests: No results for input(s): TSH, T4TOTAL, FREET4, T3FREE, THYROIDAB in the last 72 hours. Anemia Panel:  No results for input(s): VITAMINB12, FOLATE, FERRITIN, TIBC, IRON, RETICCTPCT in the last 72 hours. Urine analysis: No results found for: COLORURINE, APPEARANCEUR, LABSPEC, PHURINE, GLUCOSEU, HGBUR, BILIRUBINUR, KETONESUR, PROTEINUR, UROBILINOGEN, NITRITE, LEUKOCYTESUR  Creatinine Clearance: Estimated Creatinine Clearance: 122.6 mL/min (by C-G formula based on SCr of 1 mg/dL).  Sepsis Labs: @LABRCNTIP (procalcitonin:4,lacticidven:4) )No results found for this or any previous visit (from the past 240 hour(s)).   Radiological Exams on Admission: No results found.  EKG: Not done  Assessment/Plan Principal Problem:   Anemia Active Problems:   Diabetes mellitus type 2, diet-controlled (HCC)   OSA (obstructive sleep apnea)   Hyperlipidemia   Anemia -MCV is <80 and so this is microcytic anemia -MCV is < 70 indicating probable iron deficiency -With an MCV <70, elevated RDW, and reason for iron deficiency - iron deficiency can be presumed - but in this case, there really is not a good reason for iron deficiency -With ongoing question, will check ferritin; if <15, diagnostic of iron deficiency; if >100, very unlikely iron deficiency -If ferritin is non-diagnostic, will check transferrin saturation - the lower it is, the more likely this is iron deficiency anemia -Guaiac  negative -Transfusion initiated in ER for symptomatic anemia, 2 units PRBC -Ferritin pending, as is hemoglobin electrophoresis -Would also recommend hematology evaluation in the AM for further discussion about possible causes of his iron deficiency anemia -Hold Celebrex - although unlikely related since symptoms started before initiation of the medication and patient has no GI symptoms  DM -Diet controlled -No hyperglycemia on presentation -Will monitor without intervention for now  OSA -Continue CPAP (autoPAP)  HLD -Continue Lipitor    DVT prophylaxis: Early ambulation Code Status:  Full - confirmed with patient/family Family Communication: Wife present throughout evaluation  Disposition Plan:  Home once clinically improved Consults called: Suggest hematology in AM  Admission status: It is my clinical opinion that referral for OBSERVATION is reasonable and necessary in this patient based on the above information provided. The aforementioned taken together are felt to place the patient at high risk for further clinical deterioration. However it is anticipated that the patient may be medically stable for discharge from the hospital within 24 to 48 hours.     Karmen Bongo MD [facility]  If 7PM-7AM, please contact night-coverage [email or web address] Password [email or web address]  05/22/2016, 1:53 AM

## 2016-05-21 NOTE — ED Notes (Signed)
Called to give report x2 to 3W, awaiting callback.

## 2016-05-21 NOTE — ED Triage Notes (Signed)
PCP office called and said his Hgb was 5.4

## 2016-05-21 NOTE — ED Provider Notes (Signed)
Emergency Department Provider Note   I have reviewed the triage vital signs and the nursing notes.   HISTORY  Chief Complaint Abnormal Lab   HPI Scott Gallagher is a 45 y.o. male with PMH of DM and HLD presents to the emergency department for evaluation of abnormal lab. The patient went to his primary care physician today for routine physical exam and was found to have a very low hemoglobin. Patient states he has had some lightheadedness when bending over and shortness of breath when climbing steps which seems new over the last month. No suddenly worsening symptoms. No black or tarry stools. No gross blood in his stool or urine. A recent traumas or surgeries.   Past Medical History:  Diagnosis Date  . Diabetes mellitus without complication (HCC)    diet controlled  . Hyperlipidemia   . Sleep apnea    cpap  . Wears glasses     Patient Active Problem List   Diagnosis Date Noted  . Anemia 05/21/2016    Past Surgical History:  Procedure Laterality Date  . SHOULDER ACROMIOPLASTY Right 12/20/2014   Procedure: SHOULDER ACROMIOPLASTY;  Surgeon: Melrose Nakayama, MD;  Location: [place];  Service: Orthopedics;  Laterality: Right;  . SHOULDER ARTHROSCOPY Right 12/20/2014   Procedure: RIGHT ARTHROSCOPY SHOULDER WITH DEBRIDEMENT;  Surgeon: Melrose Nakayama, MD;  Location: [facility];  Service: Orthopedics;  Laterality: Right;  . TESTICLE SURGERY     as teen  . TONSILLECTOMY    . WISDOM TOOTH EXTRACTION        Allergies Penicillins  Family History  Problem Relation Age of Onset  . Cancer Other   . Diabetes Other   . Hyperlipidemia Other   . Hypertension Other     Social History Social History  Substance Use Topics  . Smoking status: Former Smoker    Quit date: 12/16/2012  . Smokeless tobacco: Never Used  . Alcohol use 1.8 oz/week    3 Shots of liquor per week     Comment: most days    Review of Systems  Constitutional: No fever/chills.  Positive lightheadedness on bending over.  Eyes: No visual changes. ENT: No sore throat. Cardiovascular: Denies chest pain. Respiratory: Denies shortness of breath. Gastrointestinal: No abdominal pain.  No nausea, no vomiting.  No diarrhea.  No constipation. Genitourinary: Negative for dysuria. Musculoskeletal: Negative for back pain. Skin: Negative for rash. Neurological: Negative for headaches, focal weakness or numbness.  10-point ROS otherwise negative.  ____________________________________________   PHYSICAL EXAM:  VITAL SIGNS: ED Triage Vitals [05/21/16 1610]  Enc Vitals Group     BP 136/66     Pulse Rate 103     Resp 18     Temp 98.6 F (37 C)     Temp Source Oral     SpO2 97 %   Constitutional: Alert and oriented. Well appearing and in no acute distress. Eyes: Conjunctivae are normal. PERRL. EOMI. Head: Atraumatic. Nose: No congestion/rhinnorhea. Mouth/Throat: Mucous membranes are moist.  Oropharynx non-erythematous. Neck: No stridor.   Cardiovascular: Normal rate, regular rhythm. Good peripheral circulation. Grossly normal heart sounds.   Respiratory: Normal respiratory effort.  No retractions. Lungs CTAB. Gastrointestinal: Soft and nontender. No distention. Brown stool. No gross blood or melena.  Musculoskeletal: No lower extremity tenderness nor edema. No gross deformities of extremities. Neurologic:  Normal speech and language. No gross focal neurologic deficits are appreciated.  Skin:  Skin is warm, dry and intact. No rash noted. Psychiatric: Mood and  affect are normal. Speech and behavior are normal.  ____________________________________________   LABS (all labs ordered are listed, but only abnormal results are displayed)  Labs Reviewed  CBC - Abnormal; Notable for the following:       Result Value   RBC 3.43 (*)    Hemoglobin 5.6 (*)    HCT 22.2 (*)    MCV 64.7 (*)    MCH 16.3 (*)    MCHC 25.2 (*)    RDW 18.9 (*)    Platelets 478 (*)    All  other components within normal limits  COMPREHENSIVE METABOLIC PANEL  FERRITIN  HEMOGLOBINOPATHY EVALUATION  BASIC METABOLIC PANEL  CBC  POC OCCULT BLOOD, ED  TYPE AND SCREEN  ABO/RH  PREPARE RBC (CROSSMATCH)   ____________________________________________   PROCEDURES  Procedure(s) performed:   Procedures  CRITICAL CARE Performed by: Margette Fast Total critical care time: 35 minutes Critical care time was exclusive of separately billable procedures and treating other patients. Critical care was necessary to treat or prevent imminent or life-threatening deterioration. Critical care was time spent personally by me on the following activities: development of treatment plan with patient and/or surrogate as well as nursing, discussions with consultants, evaluation of patient's response to treatment, examination of patient, obtaining history from patient or surrogate, ordering and performing treatments and interventions, ordering and review of laboratory studies, ordering and review of radiographic studies, pulse oximetry and re-evaluation of patient's condition.  Nanda Quinton, MD Emergency Medicine  ____________________________________________   INITIAL IMPRESSION / ASSESSMENT AND PLAN / ED COURSE  Pertinent labs & imaging results that were available during my care of the patient were reviewed by me and considered in my medical decision making (see chart for details).  Patient resents to the emergency department for evaluation of anemia of unknown origin. He has had mild symptoms over the last month that is not acutely worsen. Repeat hemoglobin here in the emergency department is 5.6. Brown stool on exam. Sending Hemoccult now. Consented patient for blood transfusion.   Discussed case with hospitalist. PRBC ordered along with anemia labs. See critical care billing note above with patient requiring blood transfusion in the ED with symptomatic anemia. Reviewed available labs.    ____________________________________________  FINAL CLINICAL IMPRESSION(S) / ED DIAGNOSES  Final diagnoses:  Anemia, unspecified type     MEDICATIONS GIVEN DURING THIS VISIT:  Medications  0.9 %  sodium chloride infusion (not administered)  multivitamin with minerals tablet 1 tablet (not administered)  atorvastatin (LIPITOR) tablet 40 mg (not administered)  acetaminophen (TYLENOL) tablet 650 mg (not administered)    Or  acetaminophen (TYLENOL) suppository 650 mg (not administered)  ondansetron (ZOFRAN) tablet 4 mg (not administered)    Or  ondansetron (ZOFRAN) injection 4 mg (not administered)     Note:  This document was prepared using Dragon voice recognition software and may include unintentional dictation errors.  Nanda Quinton, MD Emergency Medicine  Margette Fast, MD 05/21/16 [phone]

## 2016-05-21 NOTE — ED Notes (Signed)
Nurse will draw labs. 

## 2016-05-22 DIAGNOSIS — D509 Iron deficiency anemia, unspecified: Secondary | ICD-10-CM | POA: Diagnosis not present

## 2016-05-22 DIAGNOSIS — G4730 Sleep apnea, unspecified: Secondary | ICD-10-CM

## 2016-05-22 DIAGNOSIS — G4733 Obstructive sleep apnea (adult) (pediatric): Secondary | ICD-10-CM | POA: Diagnosis not present

## 2016-05-22 DIAGNOSIS — E119 Type 2 diabetes mellitus without complications: Secondary | ICD-10-CM | POA: Diagnosis not present

## 2016-05-22 DIAGNOSIS — E785 Hyperlipidemia, unspecified: Secondary | ICD-10-CM | POA: Diagnosis present

## 2016-05-22 DIAGNOSIS — I10 Essential (primary) hypertension: Secondary | ICD-10-CM

## 2016-05-22 DIAGNOSIS — D649 Anemia, unspecified: Secondary | ICD-10-CM

## 2016-05-22 DIAGNOSIS — D500 Iron deficiency anemia secondary to blood loss (chronic): Secondary | ICD-10-CM | POA: Diagnosis present

## 2016-05-22 LAB — CBC
HCT: 25.4 % — ABNORMAL LOW (ref 39.0–52.0)
HCT: 27.2 % — ABNORMAL LOW (ref 39.0–52.0)
Hemoglobin: 6.9 g/dL — CL (ref 13.0–17.0)
Hemoglobin: 7.5 g/dL — ABNORMAL LOW (ref 13.0–17.0)
MCH: 18.4 pg — ABNORMAL LOW (ref 26.0–34.0)
MCH: 18.6 pg — AB (ref 26.0–34.0)
MCHC: 27.2 g/dL — ABNORMAL LOW (ref 30.0–36.0)
MCHC: 27.6 g/dL — AB (ref 30.0–36.0)
MCV: 67.6 fL — AB (ref 78.0–100.0)
MCV: 67.5 fL — AB (ref 78.0–100.0)
PLATELETS: 408 10*3/uL — AB (ref 150–400)
PLATELETS: 427 10*3/uL — AB (ref 150–400)
RBC: 3.76 MIL/uL — ABNORMAL LOW (ref 4.22–5.81)
RBC: 4.03 MIL/uL — ABNORMAL LOW (ref 4.22–5.81)
RDW: 21.4 % — AB (ref 11.5–15.5)
RDW: 21.3 % — AB (ref 11.5–15.5)
WBC: 6.4 10*3/uL (ref 4.0–10.5)
WBC: 6.2 10*3/uL (ref 4.0–10.5)

## 2016-05-22 LAB — BASIC METABOLIC PANEL
Anion gap: 7 (ref 5–15)
BUN: 16 mg/dL (ref 6–20)
CALCIUM: 8.7 mg/dL — AB (ref 8.9–10.3)
CHLORIDE: 110 mmol/L (ref 101–111)
CO2: 24 mmol/L (ref 22–32)
CREATININE: 0.89 mg/dL (ref 0.61–1.24)
GFR calc non Af Amer: >60 mL/min (ref >60)
Glucose, Bld: 92 mg/dL (ref 65–99)
Potassium: 4 mmol/L (ref 3.5–5.1)
SODIUM: 141 mmol/L (ref 135–145)

## 2016-05-22 LAB — RETICULOCYTES
RBC.: 3.75 MIL/uL — AB (ref 4.22–5.81)
RETIC CT PCT: 1 % (ref 0.4–3.1)
Retic Count, Absolute: 37.5 10*3/uL (ref 19.0–186.0)

## 2016-05-22 LAB — IRON AND TIBC
IRON: 14 ug/dL — AB (ref 45–182)
Saturation Ratios: 2 % — ABNORMAL LOW (ref 17.9–39.5)
TIBC: 563 ug/dL — ABNORMAL HIGH (ref 250–450)
UIBC: 549 ug/dL

## 2016-05-22 LAB — FERRITIN
Ferritin: 2 ng/mL — ABNORMAL LOW (ref 24–336)
Ferritin: 3 ng/mL — ABNORMAL LOW (ref 24–336)

## 2016-05-22 LAB — SAVE SMEAR

## 2016-05-22 MED ORDER — SODIUM CHLORIDE 0.9 % IV SOLN
40.0000 mg | Freq: Once | INTRAVENOUS | Status: AC
  Administered 2016-05-23: 40 mg via INTRAVENOUS
  Filled 2016-05-22: qty 4

## 2016-05-22 MED ORDER — SODIUM CHLORIDE 0.9 % IV SOLN
1000.0000 mg | Freq: Once | INTRAVENOUS | Status: AC
  Administered 2016-05-23: 1000 mg via INTRAVENOUS
  Filled 2016-05-22 (×2): qty 20

## 2016-05-22 MED ORDER — SODIUM CHLORIDE 0.9 % IV SOLN
25.0000 mg | Freq: Once | INTRAVENOUS | Status: AC
  Administered 2016-05-23: 25 mg via INTRAVENOUS
  Filled 2016-05-22: qty 0.5

## 2016-05-22 MED ORDER — METHYLPREDNISOLONE SODIUM SUCC 125 MG IJ SOLR
125.0000 mg | Freq: Once | INTRAMUSCULAR | Status: AC
  Administered 2016-05-23: 125 mg via INTRAVENOUS
  Filled 2016-05-22: qty 2

## 2016-05-22 MED ORDER — SODIUM CHLORIDE 0.9 % IV SOLN: 1000.0000 mg | Freq: Once | INTRAVENOUS | Status: DC

## 2016-05-22 NOTE — Progress Notes (Signed)
Spoke with patient about using nocturnal CPAP. He declines for now. However, he states he would like for his wife to deliver his home machine in the morning for use during his hospital stay. RT will continue to follow.

## 2016-05-22 NOTE — Progress Notes (Signed)
RT placed patient on CPAP . Patient home setting is 4-15 cmH2O. Sterile water added to water chamber for humidification. Patient is tolerating well. RT will continue to monitor.

## 2016-05-22 NOTE — Consult Note (Signed)
Referral MD  Reason for Referral: Profound iron deficiency anemia   Chief Complaint  Patient presents with  . Abnormal Lab  : I got very weak.  HPI: Mr. Trabue is a very nice 45 year old white male. He has diet-controlled diabetes. He has sleep apnea. He used to smoke.  He has gotten weaker over the past month or so. He has not noted any weight loss or weight gain. He's had no obvious change in bowel or bladder habits. He's had no rashes. He's had no leg swelling.  He finally got to the point where he was almost passing out. He went to see his family doctor. Was found to be markedly anemic. He was told to go to the emergency room. He came to the emergency room. He got to the emergency room on October 5. His hemoglobin was 5.5 with a hematocrit of 22.2. His white cell count was 7.4. Platelet count was 478,000. His MCV was 65.  He has gotten 2 units of blood.  He had profound iron deficiency. His initial ferritin was 2. His corrected reticulocyte count was less than 0.5.  He has had 1 stool card that was checked for blood. It was negative.  There is history of cancer on his mother's side of the family.  He is not a vegetarian. He really does not drink. He has no occupational exposures. He used to be in [employer]. He grew up in [place]. His wife and son are also in the [employer]. I thanked him for his service to our country.  He has been taking Celebrex. He has been on a B aspirin.  We are asked to see him to try to help out with management of the anemia.    Past Medical History:  Diagnosis Date  . Diabetes mellitus without complication (HCC)    diet controlled  . Hyperlipidemia   . Sleep apnea    cpap  . Wears glasses   :  Past Surgical History:  Procedure Laterality Date  . SHOULDER ACROMIOPLASTY Right 12/20/2014   Procedure: SHOULDER ACROMIOPLASTY;  Surgeon: Melrose Nakayama, MD;  Location: [place];  Service: Orthopedics;  Laterality: Right;  . SHOULDER  ARTHROSCOPY Right 12/20/2014   Procedure: RIGHT ARTHROSCOPY SHOULDER WITH DEBRIDEMENT;  Surgeon: Melrose Nakayama, MD;  Location: [facility];  Service: Orthopedics;  Laterality: Right;  . TESTICLE SURGERY     as teen  . TONSILLECTOMY    . WISDOM TOOTH EXTRACTION    :   Current Facility-Administered Medications:  .  0.9 %  sodium chloride infusion, 10 mL/hr, Intravenous, Once, Margette Fast, MD .  acetaminophen (TYLENOL) tablet 650 mg, 650 mg, Oral, Q6H PRN **OR** acetaminophen (TYLENOL) suppository 650 mg, 650 mg, Rectal, Q6H PRN, Karmen Bongo, MD .  atorvastatin (LIPITOR) tablet 40 mg, 40 mg, Oral, Daily, Karmen Bongo, MD .  Derrill Memo ON 05/23/2016] famotidine (PEPCID) 40 mg in sodium chloride 0.9 % 50 mL IVPB, 40 mg, Intravenous, Once, Volanda Napoleon, MD .  Derrill Memo ON 05/23/2016] iron dextran complex (INFED) 1,000 mg in sodium chloride 0.9 % 500 mL IVPB, 1,000 mg, Intravenous, Once, Volanda Napoleon, MD .  Derrill Memo ON 05/23/2016] methylPREDNISolone sodium succinate (SOLU-MEDROL) 125 mg/2 mL injection 125 mg, 125 mg, Intravenous, Once, Volanda Napoleon, MD .  multivitamin with minerals tablet 1 tablet, 1 tablet, Oral, Daily, Karmen Bongo, MD, 1 tablet at 05/22/16 1010 .  ondansetron (ZOFRAN) tablet 4 mg, 4 mg, Oral, Q6H PRN **OR** ondansetron (  ZOFRAN) injection 4 mg, 4 mg, Intravenous, Q6H PRN, Karmen Bongo, MD:  . sodium chloride  10 mL/hr Intravenous Once  . atorvastatin  40 mg Oral Daily  . [START ON 05/23/2016] famotidine (PEPCID) IV  40 mg Intravenous Once  . [START ON 05/23/2016] iron dextran (INFED/DEXFERRUM) infusion  1,000 mg Intravenous Once  . [START ON 05/23/2016] methylPREDNISolone (SOLU-MEDROL) injection  125 mg Intravenous Once  . multivitamin with minerals  1 tablet Oral Daily  :  Allergies  Allergen Reactions  . Penicillins   :  Family History  Problem Relation Age of Onset  . Cancer Other   . Diabetes Other   . Hyperlipidemia Other   . Hypertension Other    :  Social History   Social History  . Marital status: Married    Spouse name: N/A  . Number of children: N/A  . Years of education: N/A   Occupational History  . cemetery maintenance    Social History Main Topics  . Smoking status: Former Smoker    Quit date: 12/16/2012  . Smokeless tobacco: Never Used  . Alcohol use 1.8 oz/week    3 Shots of liquor per week     Comment: most days  . Drug use: No  . Sexual activity: Not on file   Other Topics Concern  . Not on file   Social History Narrative  . No narrative on file  :  Pertinent items are noted in HPI.  Exam: Patient Vitals for the past 24 hrs:  BP Temp Temp src Pulse Resp SpO2 Height Weight  05/22/16 1448 117/73 98.2 F (36.8 C) Oral 74 16 97 % - -  05/22/16 0610 120/79 98.3 F (36.8 C) Oral 73 14 94 % - -  05/22/16 0244 126/72 98.7 F (37.1 C) Oral 75 14 95 % - -  05/22/16 0047 126/74 98.4 F (36.9 C) Oral 80 16 100 % - -  05/22/16 0032 126/72 98.4 F (36.9 C) Oral 81 16 100 % - -  05/21/16 2145 (!) 153/74 98.5 F (36.9 C) Oral 81 16 100 % 6\' 2"  (1.88 m) 245 lb 9.5 oz (111.4 kg)  05/21/16 2059 132/73 98.5 F (36.9 C) Oral 76 17 100 % - -  05/21/16 2035 125/78 98.6 F (37 C) Oral 84 17 100 % - -  05/21/16 2034 125/78 98.6 F (37 C) Oral 84 17 100 % - -  05/21/16 1854 142/68 98.2 F (36.8 C) Oral 102 18 98 % - -    Well-developed and well-nourished white male in no obvious distress. Head and neck exam shows no ocular or oral lesions. There are no palpable cervical or supraclavicular lymph nodes. His conjunctiva are pale. Thyroid is nonpalpable. Lungs are clear bilaterally. Cardiac exam regular rate and rhythm with no murmurs, rubs or bruits. Abdomen is soft. He has good bowel sounds. There is no fluid wave. There is no guarding or rebound tenderness. His liver edge might be palpable at the right costal margin. Spleen tip is not palpable. Extremities shows some trace edema in his legs. Skin exam shows no rashes,  ecchymoses or petechia. Neurological exam shows no focal neurological deficits.    Recent Labs  05/21/16 1637 05/22/16 0427  WBC 7.4 6.4  HGB 5.6* 6.9*  HCT 22.2* 25.4*  PLT 478* 408*    Recent Labs  05/21/16 1637 05/22/16 0427  NA 141 141  K 3.9 4.0  CL 110 110  CO2 25 24  GLUCOSE 88 92  BUN 18 16  CREATININE 1.00 0.89  CALCIUM 9.3 8.7*    Blood smear review:  Microcytic and hypochromic red blood cells. He has no nucleated red blood cells. There are no schistocytes or spherocytes. There may be couple target cells. He has no rouleau formation. White cells appear normal in morphology maturation. Has an immature myeloid or lymphoid forms. There are no hypersegmented polys. Platelets are slightly increased in number. Platelets are well granulated.  Pathology: None     Assessment and Plan:  Mr. Trierweiler is a 45 year old white male. He has profound iron deficiency anemia.  Despite the fact that the one stool was heme negative, I really have to believe that he has bleeding somewhere. Given that he is not a male, I have to worry that there is bleeding from the GI tract. Of note, I forgot to mention earlier that he has been taking Celebrex. He is on baby aspirin.  I believe that he clearly needs an upper and lower endoscopy.  I also will get a CT scan. I want to make sure that there is nothing that we are overlooking that could be a malignancy.  He definitely needs IV iron. I will give him iron and dextran as I can give more iron with the iron dextran. I talked to him about this. He understands this. He agrees to take this. I will make sure he is premedicated with Solu-Medrol and Pepcid.  Again, I had to make sure that we rule out an underlying malignancy. He can easily have a right-sided colon cancer that could cause this amount of anemia with few symptoms.  He does not need a bone marrow test from my point of view. I do not see anything on the blood smear that looks suspicious  for a bone marrow disorder.  He is very nice. I answered all the questions from he and his family.  We will follow along.  Lattie Haw, MD  Jeneen Rinks 1:5-7

## 2016-05-22 NOTE — Progress Notes (Signed)
PROGRESS NOTE  Scott Gallagher  [id number] DOB: 1970-12-01 DOA: 05/21/2016 PCP: Gara Kroner, MD  Outpatient Specialists: None  Brief Narrative: Scott Gallagher is a 45 y.o. male with medical history significant of DM, OSA, and HLD presenting after he was sent by his PCP for anemia. He reports about 1 month of light-headedness, palpitations, and decreased exercise capacity. He presented to his PCP for 6 month follow up on the day of admission, found to be severely anemic and was called and told to go to the ED. He denies any bleeding or history of anemia. Hemoglobin on arrival was 5.6 with microcytic indices, FOBT negative. Limited anemia labs were drawn and 2u PRBCs were administered overnight. Hematology has been consulted.   Assessment & Plan: Principal Problem:   Anemia Active Problems:   Diabetes mellitus type 2, diet-controlled (HCC)   OSA (obstructive sleep apnea)   Hyperlipidemia  Microcytic, hypochromic anemia: Unknown duration, symptomatic for ~ 1 month. No history of colonoscopy. +FH of anemia in mother "like sickle cell anemia" unable to further elaborate.  - Work up pending, including: ferritin, Fe, TIBC, reticulocytes, Hb electrophoresis. - Appreciate heme recommendations - Post 2u PRBCs, hgb 6.9. Recheck in the PM and transfuse if < 7.   T2DM: Diet-controlled. No HbA1c in system. Euglycemic.  - Will monitor with blood draws without intervention for now  OSA: Chronic, stable - Continue CPAP qHS  HLD: Chronic, stable. - Continue Lipitor  DVT prophylaxis: Early ambulation, no anticoagulation with anemia. Code Status: Full Family Communication: Spoke with wife at bedside. Disposition Plan: Continue observation services for work up and monitoring of anemia of unknown etiology on medical floor  Consultants:   Hematology, Dr. Marin Olp  Procedures:   2u PRBCs transfused 10/5  Antimicrobials:  None   Subjective: Pt has no complaints. Endorses same history as  provided at admission. He's a gravedigger/landscaper, former smoker, daily drinker ~ 2/day, and has no h/o bleeding or anemia. Reports no significant change since   Objective: Vitals:   05/22/16 0032 05/22/16 0047 05/22/16 0244 05/22/16 0610  BP: 126/72 126/74 126/72 120/79  Pulse: 81 80 75 73  Resp: 16 16 14 14   Temp: 98.4 F (36.9 C) 98.4 F (36.9 C) 98.7 F (37.1 C) 98.3 F (36.8 C)  TempSrc: Oral Oral Oral Oral  SpO2: 100% 100% 95% 94%  Weight:      Height:        Intake/Output Summary (Last 24 hours) at 05/22/16 0955 Last data filed at 05/22/16 [id number]  Gross per 24 hour  Intake             1337 ml  Output              800 ml  Net              537 ml   Filed Weights   05/21/16 2145  Weight: 111.4 kg (245 lb 9.5 oz)    Examination: General exam: 45 y.o. male in no distress HEENT: +conjunctival pallor, anicteric sclerae Respiratory system: Non-labored breathing. Clear to auscultation bilaterally.  Cardiovascular system: Regular rate and rhythm. No murmur, rub, or gallop. No JVD, and no pedal edema. Gastrointestinal system: Abdomen soft, non-tender, non-distended, with normoactive bowel sounds. No hepatomegaly or splenomegaly. Central nervous system: Alert and oriented. No focal neurological deficits. Extremities: Warm, no deformities Skin: Pallorous. No rashes, lesions or ulcers Psychiatry: Judgement and insight appear normal. Mood & affect appropriate.   Data Reviewed: I have personally reviewed following labs and imaging  studies  CBC:  Recent Labs Lab 05/21/16 1637 05/22/16 0427  WBC 7.4 6.4  HGB 5.6* 6.9*  HCT 22.2* 25.4*  MCV 64.7* 67.6*  PLT 478* [id number]*   Basic Metabolic Panel:  Recent Labs Lab 05/21/16 1637 05/22/16 0427  NA 141 141  K 3.9 4.0  CL 110 110  CO2 25 24  GLUCOSE 88 92  BUN 18 16  CREATININE 1.00 0.89  CALCIUM 9.3 8.7*   GFR: Estimated Creatinine Clearance: 137.7 mL/min (by C-G formula based on SCr of 0.89 mg/dL). Liver Function  Tests:  Recent Labs Lab 05/21/16 1637  AST 20  ALT 17  ALKPHOS 60  BILITOT 0.7  PROT 7.5  ALBUMIN 4.7   No results for input(s): LIPASE, AMYLASE in the last 168 hours. No results for input(s): AMMONIA in the last 168 hours. Coagulation Profile: No results for input(s): INR, PROTIME in the last 168 hours. Cardiac Enzymes: No results for input(s): CKTOTAL, CKMB, CKMBINDEX, TROPONINI in the last 168 hours. BNP (last 3 results) No results for input(s): PROBNP in the last 8760 hours. HbA1C: No results for input(s): HGBA1C in the last 72 hours. CBG: No results for input(s): GLUCAP in the last 168 hours. Lipid Profile: No results for input(s): CHOL, HDL, LDLCALC, TRIG, CHOLHDL, LDLDIRECT in the last 72 hours. Thyroid Function Tests: No results for input(s): TSH, T4TOTAL, FREET4, T3FREE, THYROIDAB in the last 72 hours. Anemia Panel:  Recent Labs  05/22/16 0427  RETICCTPCT 1.0   Urine analysis: No results found for: COLORURINE, APPEARANCEUR, LABSPEC, PHURINE, GLUCOSEU, HGBUR, BILIRUBINUR, KETONESUR, PROTEINUR, UROBILINOGEN, NITRITE, LEUKOCYTESUR Sepsis Labs: @LABRCNTIP (procalcitonin:4,lacticidven:4)  )No results found for this or any previous visit (from the past 240 hour(s)).   Radiology Studies: No results found.  Scheduled Meds: . sodium chloride  10 mL/hr Intravenous Once  . atorvastatin  40 mg Oral Daily  . multivitamin with minerals  1 tablet Oral Daily   Continuous Infusions:    LOS: 0 days   Time spent: 25 minutes.  Vance Gather, MD [facility] Pager [phone]  If 7PM-7AM, please contact night-coverage [email or web address] Password [email or web address] 05/22/2016, 9:55 AM

## 2016-05-23 ENCOUNTER — Encounter (HOSPITAL_COMMUNITY): Payer: Self-pay | Admitting: Radiology

## 2016-05-23 ENCOUNTER — Observation Stay (HOSPITAL_COMMUNITY): Payer: 59

## 2016-05-23 DIAGNOSIS — D500 Iron deficiency anemia secondary to blood loss (chronic): Secondary | ICD-10-CM | POA: Diagnosis not present

## 2016-05-23 DIAGNOSIS — G4733 Obstructive sleep apnea (adult) (pediatric): Secondary | ICD-10-CM | POA: Diagnosis not present

## 2016-05-23 DIAGNOSIS — E785 Hyperlipidemia, unspecified: Secondary | ICD-10-CM | POA: Diagnosis not present

## 2016-05-23 DIAGNOSIS — E119 Type 2 diabetes mellitus without complications: Secondary | ICD-10-CM | POA: Diagnosis not present

## 2016-05-23 LAB — BASIC METABOLIC PANEL
ANION GAP: 5 (ref 5–15)
BUN: 16 mg/dL (ref 6–20)
CALCIUM: 8.8 mg/dL — AB (ref 8.9–10.3)
CHLORIDE: 110 mmol/L (ref 101–111)
CO2: 25 mmol/L (ref 22–32)
CREATININE: 0.93 mg/dL (ref 0.61–1.24)
GFR calc Af Amer: >60 mL/min (ref >60)
GFR calc non Af Amer: >60 mL/min (ref >60)
GLUCOSE: 103 mg/dL — AB (ref 65–99)
Potassium: 4.1 mmol/L (ref 3.5–5.1)
Sodium: 140 mmol/L (ref 135–145)

## 2016-05-23 LAB — CBC
HEMATOCRIT: 27.4 % — AB (ref 39.0–52.0)
Hemoglobin: 7.5 g/dL — ABNORMAL LOW (ref 13.0–17.0)
MCH: 18.6 pg — AB (ref 26.0–34.0)
MCHC: 27.4 g/dL — ABNORMAL LOW (ref 30.0–36.0)
MCV: 68 fL — AB (ref 78.0–100.0)
Platelets: 443 10*3/uL — ABNORMAL HIGH (ref 150–400)
RBC: 4.03 MIL/uL — AB (ref 4.22–5.81)
RDW: 21.6 % — ABNORMAL HIGH (ref 11.5–15.5)
WBC: 7.1 10*3/uL (ref 4.0–10.5)

## 2016-05-23 LAB — ERYTHROPOIETIN: Erythropoietin: 422.2 m[IU]/mL — ABNORMAL HIGH (ref 2.6–18.5)

## 2016-05-23 MED ORDER — IOPAMIDOL (ISOVUE-300) INJECTION 61%
100.0000 mL | Freq: Once | INTRAVENOUS | Status: AC | PRN
  Administered 2016-05-23: 100 mL via INTRAVENOUS

## 2016-05-23 MED ORDER — IOPAMIDOL (ISOVUE-300) INJECTION 61%
30.0000 mL | Freq: Once | INTRAVENOUS | Status: AC | PRN
  Administered 2016-05-23: 30 mL via ORAL

## 2016-05-23 NOTE — Progress Notes (Signed)
Pt had no adverse reactions to first iron infusion.

## 2016-05-23 NOTE — Progress Notes (Signed)
PROGRESS NOTE  Scott Gallagher  [id number] DOB: [birth year] DOA: 05/21/2016 PCP: Gara Kroner, MD  Outpatient Specialists: None  Brief Narrative: Scott Gallagher is a 45 y.o. male with medical history significant of DM, OSA, and HLD presenting after he was sent by his PCP for anemia. He reports about 1 month of light-headedness, palpitations, and decreased exercise capacity. He presented to his PCP for 6 month follow up on the day of admission, found to be severely anemic and was called and told to go to the ED. He denies any bleeding or history of anemia. Hemoglobin on arrival was 5.6 with microcytic indices, FOBT negative. Limited anemia labs were drawn and 2u PRBCs and iron dextran was administered. Hematology has been consulted, recommends CT looking for occult malignancy.   Assessment & Plan: Principal Problem:   Anemia Active Problems:   Diabetes mellitus type 2, diet-controlled (HCC)   OSA (obstructive sleep apnea)   Hyperlipidemia   Anemia due to blood loss, chronic  Profound iron-deficiency anemia: Unknown duration, symptomatic for ~ 1 month. No history of colonoscopy. +FH of anemia in mother "like sickle cell anemia" unable to further elaborate.  - Hgb stable at 7.5. Iron dextran given this AM - Hematology following - CT chest/abd/pelvis pending. Will contact GI once this results. Pt will need endoscopy.   T2DM: Diet-controlled. No HbA1c in system. Euglycemic.  - Will monitor with blood draws without intervention for now  OSA: Chronic, stable - Continue CPAP qHS  HLD: Chronic, stable. - Continue Lipitor  DVT prophylaxis: Early ambulation, no anticoagulation with anemia. Code Status: Full Family Communication: Spoke with wife at bedside. Disposition Plan: Pending CT results and stability of anemia, may D/C in 24 hours.   Consultants:   Hematology, Dr. Marin Olp  Procedures:   2u PRBCs transfused 10/5  Antimicrobials:  None   Subjective: Pt has no  complaints. Still no bleeding, dyspnea, palpitations, light-headedness. Again denies recent weight loss, fevers, chills, night sweats.   Objective: Vitals:   05/22/16 1448 05/22/16 2100 05/23/16 0610 05/23/16 1340  BP: 117/73 124/71 118/71 130/76  Pulse: 74 79 88 80  Resp: 16 16 16 15   Temp: 98.2 F (36.8 C) 98.1 F (36.7 C) 98 F (36.7 C) 97.8 F (36.6 C)  TempSrc: Oral Oral Oral Oral  SpO2: 97% 100% 96% 94%  Weight:      Height:        Intake/Output Summary (Last 24 hours) at 05/23/16 1400 Last data filed at 05/23/16 1341  Gross per 24 hour  Intake             1440 ml  Output             3075 ml  Net            -1635 ml   Filed Weights   05/21/16 2145  Weight: 111.4 kg (245 lb 9.5 oz)    Examination: General exam: 45 y.o. male in no distress HEENT: +conjunctival pallor, anicteric sclerae Respiratory system: Non-labored breathing. Clear to auscultation bilaterally.  Cardiovascular system: Regular rate and rhythm. No murmur, rub, or gallop. No JVD, and no pedal edema. Gastrointestinal system: Abdomen soft, non-tender, non-distended, with normoactive bowel sounds. No hepatomegaly or splenomegaly. Central nervous system: Alert and oriented. No focal neurological deficits. Extremities: Warm, no deformities Skin: Overall more hue to his skin today. No rashes, lesions or ulcers Psychiatry: Judgement and insight appear normal. Mood & affect appropriate.   Data Reviewed: I have personally reviewed following labs and imaging  studies  CBC:  Recent Labs Lab 05/21/16 1637 05/22/16 0427 05/22/16 1839 05/23/16 0420  WBC 7.4 6.4 6.2 7.1  HGB 5.6* 6.9* 7.5* 7.5*  HCT 22.2* 25.4* 27.2* 27.4*  MCV 64.7* 67.6* 67.5* 68.0*  PLT 478* 408* 427* [id number]*   Basic Metabolic Panel:  Recent Labs Lab 05/21/16 1637 05/22/16 0427 05/23/16 0420  NA 141 141 140  K 3.9 4.0 4.1  CL 110 110 110  CO2 25 24 25   GLUCOSE 88 92 103*  BUN 18 16 16   CREATININE 1.00 0.89 0.93  CALCIUM 9.3  8.7* 8.8*   GFR: Estimated Creatinine Clearance: 131.8 mL/min (by C-G formula based on SCr of 0.93 mg/dL). Liver Function Tests:  Recent Labs Lab 05/21/16 1637  AST 20  ALT 17  ALKPHOS 60  BILITOT 0.7  PROT 7.5  ALBUMIN 4.7   No results for input(s): LIPASE, AMYLASE in the last 168 hours. No results for input(s): AMMONIA in the last 168 hours. Coagulation Profile: No results for input(s): INR, PROTIME in the last 168 hours. Cardiac Enzymes: No results for input(s): CKTOTAL, CKMB, CKMBINDEX, TROPONINI in the last 168 hours. BNP (last 3 results) No results for input(s): PROBNP in the last 8760 hours. HbA1C: No results for input(s): HGBA1C in the last 72 hours. CBG: No results for input(s): GLUCAP in the last 168 hours. Lipid Profile: No results for input(s): CHOL, HDL, LDLCALC, TRIG, CHOLHDL, LDLDIRECT in the last 72 hours. Thyroid Function Tests: No results for input(s): TSH, T4TOTAL, FREET4, T3FREE, THYROIDAB in the last 72 hours. Anemia Panel:  Recent Labs  05/22/16 0427 05/22/16 1005  FERRITIN 2* 3*  TIBC  --  563*  IRON  --  14*  RETICCTPCT 1.0  --    Urine analysis: No results found for: COLORURINE, APPEARANCEUR, LABSPEC, PHURINE, GLUCOSEU, HGBUR, BILIRUBINUR, KETONESUR, PROTEINUR, UROBILINOGEN, NITRITE, LEUKOCYTESUR Sepsis Labs: @LABRCNTIP (procalcitonin:4,lacticidven:4)  )No results found for this or any previous visit (from the past 240 hour(s)).   Radiology Studies: No results found.  Scheduled Meds: . sodium chloride  10 mL/hr Intravenous Once  . atorvastatin  40 mg Oral Daily  . multivitamin with minerals  1 tablet Oral Daily   Continuous Infusions:    LOS: 0 days   Time spent: 25 minutes.  Vance Gather, MD [facility] Pager [phone]  If 7PM-7AM, please contact night-coverage [email or web address] Password [email or web address] 05/23/2016, 2:00 PM

## 2016-05-23 NOTE — Progress Notes (Signed)
Pt stated that he will self administer CPAP when ready for bed.  Pt to notify RT if any assistance is required throughout the night. RT to monitor and assess as needed.  

## 2016-05-24 DIAGNOSIS — D500 Iron deficiency anemia secondary to blood loss (chronic): Secondary | ICD-10-CM | POA: Diagnosis not present

## 2016-05-24 LAB — HEMOGLOBIN AND HEMATOCRIT, BLOOD
HCT: 26.6 % — ABNORMAL LOW (ref 39.0–52.0)
Hemoglobin: 7.3 g/dL — ABNORMAL LOW (ref 13.0–17.0)

## 2016-05-24 NOTE — Discharge Summary (Signed)
Physician Discharge Summary  Azzie Claybaugh [id number] DOB: April 09, 1971 DOA: 05/21/2016  PCP: Gara Kroner, MD  Admit date: 05/21/2016 Discharge date: 05/24/2016  Admitted From: Home Disposition: Home   Recommendations for Outpatient Follow-up:  1. Follow up with Heme/Onc Dr. Marin Olp in 1-2 weeks 2. Monitor CBC.  3. Told to hold aspirin until follow up recommendations. 4. Will require GI referral for diagnostic endoscopy for presumed GI bleeding source.  Home Health: None Equipment/Devices: None Discharge Condition: Stable CODE STATUS: Full Diet recommendation: Carbohydrate-limited  Brief/Interim Summary: Alcides Malloryis a 45 y.o.malewith medical history significant of DM, OSA, and HLD presenting after he was sent by his PCP for anemia. He reports about 1 month of light-headedness, palpitations, and decreased exercise capacity. He presented to his PCP for 6 month follow up on the day of admission, found to be severely anemic and was called and told to go to the ED. He denies any bleeding or history of anemia. Hemoglobin on arrival was 5.6 with microcytic indices, FOBT negative. Anemia labs showed profound iron deficiency. 2u PRBCs and iron dextran was administered. He continued to exhibit no signs of bleeding, hemoglobin has remained stable at 7.5 > 7.5 > 7.3 following transfusion. GI was not consulted due to absence of bleeding. Hematology, Dr. Marin Olp was consulted and recommended CT chest/abdomen/pelvis which showed no evidence of malignancy. He was notified of negative results and plan to discharge. The patient will call his office for follow up in the next 1-2 weeks.   Discharge Diagnoses:  Principal Problem:   Anemia due to blood loss, chronic Active Problems:   Anemia   Diabetes mellitus type 2, diet-controlled (HCC)   OSA (obstructive sleep apnea)   Hyperlipidemia  Discharge Instructions Discharge Instructions    Call MD for:  difficulty breathing, headache or visual  disturbances    Complete by:  As directed    Call MD for:  extreme fatigue    Complete by:  As directed    Call MD for:  persistant dizziness or light-headedness    Complete by:  As directed    Discharge instructions    Complete by:  As directed    You were found to be severely anemic, the cause of which remains unknown. This was treated with a transfusion of blood, after which your hemoglobin has remained stable. You are being discharged with the following directions:  - Call Dr. Antonieta Pert office in the morning to schedule a follow up appointment - STOP taking aspirin and celebrex and all over the counter pain medications except tylenol - Report any signs of bleeding to your doctor or return for care urgently       Medication List    STOP taking these medications   aspirin EC 81 MG tablet   celecoxib 200 MG capsule Commonly known as:  CELEBREX     TAKE these medications   atorvastatin 40 MG tablet Commonly known as:  LIPITOR Take 40 mg by mouth daily.   MULTIVITAMIN ADULTS Tabs Take 1 tablet by mouth daily.      Follow-up Information    Volanda Napoleon, MD. Schedule an appointment as soon as possible for a visit in 1 week(s).   Specialty:  Oncology Contact information: [place] [address] [place], [place] .   Specialty:  Family Medicine Contact information: [address] [place] [address] [phone]          Allergies  Allergen Reactions  . Penicillins     Consultations:  Heme/onc Dr Marin Olp  Procedures/Studies: Ct Chest W Contrast  Result Date: 05/23/2016 CLINICAL DATA:  Anemia from chronic blood loss. Patient has become weaker over the last month. EXAM: CT CHEST, ABDOMEN, AND PELVIS WITH CONTRAST TECHNIQUE: Multidetector CT imaging of the chest, abdomen and pelvis was performed following the standard protocol during bolus administration of intravenous contrast. CONTRAST:   11mL ISOVUE-300 IOPAMIDOL (ISOVUE-300) INJECTION 61% COMPARISON:  None. FINDINGS: CT CHEST FINDINGS Cardiovascular: Heart is normal in size and configuration. No coronary artery calcifications. Great vessels normal in caliber. No aortic atherosclerosis. Mediastinum/Nodes: No enlarged mediastinal, hilar, or axillary lymph nodes. Thyroid gland, trachea, and esophagus demonstrate no significant findings. Lungs/Pleura: Minor subsegmental atelectasis in the right lower lobe. Lungs otherwise clear. No pleural effusion or pneumothorax. Musculoskeletal: No chest wall abnormality. No acute or significant osseous findings. CT ABDOMEN PELVIS FINDINGS Hepatobiliary: 12 mm low-density lesion in the anterior segment of the right lobe. Liver otherwise unremarkable. Normal gallbladder. No bile duct dilation. Pancreas: Unremarkable. No pancreatic ductal dilatation or surrounding inflammatory changes. Spleen: Normal in size without focal abnormality. Adrenals/Urinary Tract: No adrenal masses. Single low-density masses in each kidney measuring 13 mm, in the mid to lower pole on each side, consistent with cysts. No other renal masses, no stones and no hydronephrosis. Normal ureters. Normal bladder. Stomach/Bowel: Stomach is within normal limits. Appendix appears normal. No evidence of bowel wall thickening, distention, or inflammatory changes. Vascular/Lymphatic: Minor inferior aorta atherosclerotic calcifications. No aneurysm. No adenopathy. Reproductive: Prostate is unremarkable. Other: No abdominal wall hernia or abnormality. No abdominopelvic ascites. Musculoskeletal: Unremarkable. IMPRESSION: 1. No acute findings within the chest, abdomen or pelvis. 2. No findings to account for chronic blood loss. 3. 12 mm low-density liver lesion. This is not as low in attenuation as would be expected for a cyst. The lesion is nonspecific. This could be further assessed with liver MRI with and without contrast. 4. Single renal cyst bilaterally.  5. Minor aortic atherosclerosis. Electronically Signed   By: Lajean Manes M.D.   On: 05/23/2016 14:05   Ct Abdomen Pelvis W Contrast  Result Date: 05/23/2016 CLINICAL DATA:  Anemia from chronic blood loss. Patient has become weaker over the last month. EXAM: CT CHEST, ABDOMEN, AND PELVIS WITH CONTRAST TECHNIQUE: Multidetector CT imaging of the chest, abdomen and pelvis was performed following the standard protocol during bolus administration of intravenous contrast. CONTRAST:  169mL ISOVUE-300 IOPAMIDOL (ISOVUE-300) INJECTION 61% COMPARISON:  None. FINDINGS: CT CHEST FINDINGS Cardiovascular: Heart is normal in size and configuration. No coronary artery calcifications. Great vessels normal in caliber. No aortic atherosclerosis. Mediastinum/Nodes: No enlarged mediastinal, hilar, or axillary lymph nodes. Thyroid gland, trachea, and esophagus demonstrate no significant findings. Lungs/Pleura: Minor subsegmental atelectasis in the right lower lobe. Lungs otherwise clear. No pleural effusion or pneumothorax. Musculoskeletal: No chest wall abnormality. No acute or significant osseous findings. CT ABDOMEN PELVIS FINDINGS Hepatobiliary: 12 mm low-density lesion in the anterior segment of the right lobe. Liver otherwise unremarkable. Normal gallbladder. No bile duct dilation. Pancreas: Unremarkable. No pancreatic ductal dilatation or surrounding inflammatory changes. Spleen: Normal in size without focal abnormality. Adrenals/Urinary Tract: No adrenal masses. Single low-density masses in each kidney measuring 13 mm, in the mid to lower pole on each side, consistent with cysts. No other renal masses, no stones and no hydronephrosis. Normal ureters. Normal bladder. Stomach/Bowel: Stomach is within normal limits. Appendix appears normal. No evidence of bowel wall thickening, distention, or inflammatory changes. Vascular/Lymphatic: Minor  inferior aorta atherosclerotic calcifications. No aneurysm. No adenopathy. Reproductive:  Prostate is unremarkable. Other: No abdominal wall hernia or abnormality. No abdominopelvic ascites. Musculoskeletal: Unremarkable. IMPRESSION: 1. No acute findings within the chest, abdomen or pelvis. 2. No findings to account for chronic blood loss. 3. 12 mm low-density liver lesion. This is not as low in attenuation as would be expected for a cyst. The lesion is nonspecific. This could be further assessed with liver MRI with and without contrast. 4. Single renal cyst bilaterally. 5. Minor aortic atherosclerosis. Electronically Signed   By: Lajean Manes M.D.   On: 05/23/2016 14:05     Subjective: Pt feels well. No bleeding.   Discharge Exam: Vitals:   05/23/16 2312 05/24/16 0445  BP: 126/73 134/76  Pulse: 73 74  Resp: 20 20  Temp: 98.3 F (36.8 C) 98.1 F (36.7 C)   Vitals:   05/23/16 1340 05/23/16 2107 05/23/16 2312 05/24/16 0445  BP: 130/76  126/73 134/76  Pulse: 80  73 74  Resp: 15 18 20 20   Temp: 97.8 F (36.6 C)  98.3 F (36.8 C) 98.1 F (36.7 C)  TempSrc: Oral  Oral Oral  SpO2: 94%  95% 99%  Weight:      Height:       General: Pt is alert, awake, not in acute distress Cardiovascular: RRR, S1/S2 +, no rubs, no gallops Respiratory: CTA bilaterally, no wheezing, no rhonchi Abdominal: Soft, NT, ND, bowel sounds + Extremities: no edema, no cyanosis  The results of significant diagnostics from this hospitalization (including imaging, microbiology, ancillary and laboratory) are listed below for reference.    Microbiology: None  Labs: Basic Metabolic Panel:  Recent Labs Lab 05/21/16 1637 05/22/16 0427 05/23/16 0420  NA 141 141 140  K 3.9 4.0 4.1  CL 110 110 110  CO2 25 24 25   GLUCOSE 88 92 103*  BUN 18 16 16   CREATININE 1.00 0.89 0.93  CALCIUM 9.3 8.7* 8.8*   Liver Function Tests:  Recent Labs Lab 05/21/16 1637  AST 20  ALT 17  ALKPHOS 60  BILITOT 0.7  PROT 7.5  ALBUMIN 4.7   CBC:  Recent Labs Lab 05/21/16 1637 05/22/16 0427 05/22/16 1839  05/23/16 0420 05/24/16 0423  WBC 7.4 6.4 6.2 7.1  --   HGB 5.6* 6.9* 7.5* 7.5* 7.3*  HCT 22.2* 25.4* 27.2* 27.4* 26.6*  MCV 64.7* 67.6* 67.5* 68.0*  --   PLT 478* 408* 427* 443*  --    Anemia work up  Recent Labs  05/22/16 0427 05/22/16 1005  FERRITIN 2* 3*  TIBC  --  563*  IRON  --  14*  RETICCTPCT 1.0  --    Time coordinating discharge: Over 30 minutes  Vance Gather, MD  [facility] 05/24/2016, 1:12 PM Pager [phone]  If 7PM-7AM, please contact night-coverage [email or web address] Password [email or web address]

## 2016-05-24 NOTE — Progress Notes (Signed)
Patient discharged to home, all discharge medications and instructions reviewed and questions answered.   

## 2016-05-25 LAB — TYPE AND SCREEN
ABO/RH(D): B POS
Antibody Screen: NEGATIVE
UNIT DIVISION: 0
Unit division: 0

## 2016-05-25 LAB — HEMOGLOBINOPATHY EVALUATION
Hgb A2 Quant: 1.5 % (ref 0.7–3.1)
Hgb A: 98.5 % — ABNORMAL HIGH (ref 94.0–98.0)
Hgb C: 0 %
Hgb F Quant: 0 % (ref 0.0–2.0)
Hgb S Quant: 0 %

## 2016-05-28 ENCOUNTER — Other Ambulatory Visit: Payer: Self-pay | Admitting: *Deleted

## 2016-05-28 DIAGNOSIS — D500 Iron deficiency anemia secondary to blood loss (chronic): Secondary | ICD-10-CM

## 2016-05-29 ENCOUNTER — Other Ambulatory Visit (HOSPITAL_BASED_OUTPATIENT_CLINIC_OR_DEPARTMENT_OTHER): Payer: 59

## 2016-05-29 ENCOUNTER — Ambulatory Visit (HOSPITAL_BASED_OUTPATIENT_CLINIC_OR_DEPARTMENT_OTHER): Payer: 59 | Admitting: Hematology & Oncology

## 2016-05-29 ENCOUNTER — Encounter: Payer: Self-pay | Admitting: Hematology & Oncology

## 2016-05-29 VITALS — BP 113/69 | HR 65 | Temp 97.9°F | Resp 18 | Ht 74.0 in | Wt 246.0 lb

## 2016-05-29 DIAGNOSIS — D500 Iron deficiency anemia secondary to blood loss (chronic): Secondary | ICD-10-CM

## 2016-05-29 DIAGNOSIS — K922 Gastrointestinal hemorrhage, unspecified: Secondary | ICD-10-CM | POA: Diagnosis not present

## 2016-05-29 DIAGNOSIS — D649 Anemia, unspecified: Secondary | ICD-10-CM | POA: Diagnosis not present

## 2016-05-29 LAB — CBC WITH DIFFERENTIAL (CANCER CENTER ONLY)
BASO#: 0.1 10*3/uL (ref 0.0–0.2)
BASO%: 1.4 % (ref 0.0–2.0)
EOS%: 3.6 % (ref 0.0–7.0)
Eosinophils Absolute: 0.2 10*3/uL (ref 0.0–0.5)
HEMATOCRIT: 32 % — AB (ref 38.7–49.9)
HEMOGLOBIN: 9.1 g/dL — AB (ref 13.0–17.1)
LYMPH#: 1.7 10*3/uL (ref 0.9–3.3)
LYMPH%: 26.6 % (ref 14.0–48.0)
MCH: 21.4 pg — ABNORMAL LOW (ref 28.0–33.4)
MCHC: 28.4 g/dL — AB (ref 32.0–35.9)
MCV: 75 fL — ABNORMAL LOW (ref 82–98)
MONO#: 0.7 10*3/uL (ref 0.1–0.9)
MONO%: 11.1 % (ref 0.0–13.0)
NEUT%: 57.3 % (ref 40.0–80.0)
NEUTROS ABS: 3.7 10*3/uL (ref 1.5–6.5)
Platelets: 325 10*3/uL (ref 145–400)
RBC: 4.26 10*6/uL (ref 4.20–5.70)
RDW: 31.4 % — ABNORMAL HIGH (ref 11.1–15.7)
WBC: 6.4 10*3/uL (ref 4.0–10.0)

## 2016-05-29 LAB — COMPREHENSIVE METABOLIC PANEL
ALBUMIN: 4.1 g/dL (ref 3.5–5.0)
ALK PHOS: 72 U/L (ref 40–150)
ALT: 23 U/L (ref 0–55)
AST: 22 U/L (ref 5–34)
Anion Gap: 7 mEq/L (ref 3–11)
BILIRUBIN TOTAL: 0.47 mg/dL (ref 0.20–1.20)
BUN: 15.4 mg/dL (ref 7.0–26.0)
CALCIUM: 9.2 mg/dL (ref 8.4–10.4)
CO2: 25 mEq/L (ref 22–29)
CREATININE: 0.9 mg/dL (ref 0.7–1.3)
Chloride: 108 mEq/L (ref 98–109)
EGFR: >90 mL/min/{1.73_m2} (ref >90)
GLUCOSE: 93 mg/dL (ref 70–140)
POTASSIUM: 4.6 meq/L (ref 3.5–5.1)
Sodium: 140 mEq/L (ref 136–145)
TOTAL PROTEIN: 7 g/dL (ref 6.4–8.3)

## 2016-05-29 LAB — IRON AND TIBC
%SAT: 31 % (ref 20–55)
Iron: 131 ug/dL (ref 42–163)
TIBC: 423 ug/dL — ABNORMAL HIGH (ref 202–409)
UIBC: 292 ug/dL (ref 117–376)

## 2016-05-29 LAB — TECHNOLOGIST REVIEW CHCC SATELLITE

## 2016-05-29 LAB — FERRITIN: Ferritin: 214 ng/ml (ref 22–316)

## 2016-05-29 LAB — CHCC SATELLITE - SMEAR

## 2016-05-29 NOTE — Progress Notes (Signed)
Hematology and Oncology Follow Up Visit  Scott Gallagher [id number] 1971-07-02 45 y.o. 05/29/2016   Principle Diagnosis:   Iron deficiency anemia secondary to GI blood loss  Current Therapy:    IV iron-dose given on 05/23/2016     Interim History:  Scott Gallagher is back for his first office visit. I saw him in consultation at [facility] a week ago. He presented with profound anemia. His hemoglobin was 5.5. His MCV was 65.  He has not noted any melena or bright red blood per rectum. He had been on low-dose aspirin and Celebrex.  His iron studies showed a ferritin of 2. His iron saturation was 2%. His total iron was 14.  We did do a CT of his chest/abdomen/pelvis. No obvious abnormalities were found. There is no colonic wall thickening. No adenopathy. There are no liver lesions. He did have a 12 mm low-density liver lesion felt to be a cyst.  He received 2 units of blood. He got a dose of iron. We gave him iron and dextran. He got a dose of 1000 mg.  He currently is on oral iron.  He's not noted any melena. He does have some occasional abdominal pain. He's had no nausea or vomiting.  There's been no cough. He's had no rashes. He's had no picca.  Overall, his performance status is ECOG 0.  Medications:  Current Outpatient Prescriptions:  .  atorvastatin (LIPITOR) 40 MG tablet, Take 40 mg by mouth daily., Disp: , Rfl:  .  ferrous sulfate 325 (65 FE) MG tablet, Take 325 mg by mouth daily with breakfast., Disp: , Rfl:  .  Multiple Vitamins-Minerals (MULTIVITAMIN ADULTS) TABS, Take 1 tablet by mouth daily., Disp: , Rfl:  .  ONE TOUCH ULTRA TEST test strip, , Disp: , Rfl:   Allergies:  Allergies  Allergen Reactions  . Penicillins     Past Medical History, Surgical history, Social history, and Family History were reviewed and updated.  Review of Systems:  As above  Physical Exam:  height is 6\' 2"  (1.88 m) and weight is 246 lb (111.6 kg). His temperature is 97.9 F (36.6  C). His blood pressure is 113/69 and his pulse is 65. His respiration is 18.   Wt Readings from Last 3 Encounters:  05/29/16 246 lb (111.6 kg)  05/21/16 245 lb 9.5 oz (111.4 kg)  12/20/14 252 lb (114.3 kg)      Well-developed and well-nourished white male in no obvious distress. Head and neck exam shows no ocular or oral lesions. There are no palpable cervical or supraclavicular lymph nodes. Lungs are clear. Cardiac exam regular rate and rhythm with no murmurs, rubs or bruits. Abdomen is soft. Has good bowel sounds. There is no fluid wave. There is no palpable liver or spleen tip. Rectal exam shows brown stool that is heme positive. His prostate might be slightly enlarged but smooth. No hemorrhoids are noted. Extremities shows no clubbing, cyanosis or edema. Skin exam shows no rashes, ecchymoses or petechia.  Lab Results  Component Value Date   WBC 6.4 05/29/2016   HGB 9.1 (L) 05/29/2016   HCT 32.0 (L) 05/29/2016   MCV 75 (L) 05/29/2016   PLT 325 05/29/2016     Chemistry      Component Value Date/Time   NA 140 05/23/2016 0420   K 4.1 05/23/2016 0420   CL 110 05/23/2016 0420   CO2 25 05/23/2016 0420   BUN 16 05/23/2016 0420   CREATININE 0.93 05/23/2016 0420  Component Value Date/Time   CALCIUM 8.8 (L) 05/23/2016 0420   ALKPHOS 60 05/21/2016 1637   AST 20 05/21/2016 1637   ALT 17 05/21/2016 1637   BILITOT 0.7 05/21/2016 1637         Impression and Plan: Scott Gallagher is a 45 year old white male. He had profound iron deficiency anemia. I know that when he was in the hospital, his stool was heme negative. However, he is clearly heme positive. I realize that he is taking oral iron but this should not affect are guaiac test.  I truly believe that he needs an upper and lower endoscopy. He might have had gastritis. However, I want to make sure that there is no underlying malignancy that could be causing this.   I talked to him at length about this. I told him why I thought it  would be incredibly helpful to have the upper and lower endoscopy. I just want to make sure we are not overlooking any obvious malignancy. Even though the CAT scans were negative, these are not all that sensitive for intraluminal lesions.  We will plan to get him back in 5 weeks. I want him back before the holidays so we can make sure that his blood is moving in the right direction and that he will feel well for all the holidays.  He and his wife both served our country in the [employer]. I thanked him for this.  Volanda Napoleon, MD 10/13/201710:20 AM

## 2016-05-30 LAB — RETICULOCYTES: RETICULOCYTE COUNT: 2.6 % (ref 0.6–2.6)

## 2016-06-01 ENCOUNTER — Other Ambulatory Visit: Payer: Self-pay | Admitting: Gastroenterology

## 2016-06-01 DIAGNOSIS — R1013 Epigastric pain: Secondary | ICD-10-CM

## 2016-06-03 ENCOUNTER — Ambulatory Visit
Admission: RE | Admit: 2016-06-03 | Discharge: 2016-06-03 | Disposition: A | Discharge status: Home / Self Care | Payer: 59 | Source: Ambulatory Visit | Attending: Gastroenterology | Admitting: Gastroenterology

## 2016-06-03 DIAGNOSIS — R1013 Epigastric pain: Secondary | ICD-10-CM

## 2016-06-12 ENCOUNTER — Ambulatory Visit: Payer: Self-pay | Admitting: Surgery

## 2016-06-12 NOTE — H&P (Signed)
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 Lutricia Gallagher and then Dr. Oletta Gallagher for anemia.  He was found to have a near obstructing mass of his ascending colon that on biopsy shows at least high-grade adenomatous dysplasia although it looks like it's very suspicious both grossly and microscopically for adenocarcinoma.  He was seen in the office today, October 27 and laparoscopically or robotically assisted right hemicolectomy was discussed with him.  Because he is having pain on the right side of the need to go ahead and move this up as soon as possible.  Told him to stay on a full liquid type diet.  We'll schedule this at [place] long.  Past Medical History:  Diagnosis Date  . Diabetes mellitus without complication (HCC)    diet controlled  . Hyperlipidemia   . Sleep apnea    cpap  . Wears glasses     Past Surgical History:  Procedure Laterality Date  . SHOULDER ACROMIOPLASTY Right 12/20/2014   Procedure: SHOULDER ACROMIOPLASTY;  Surgeon: Scott Nakayama, MD;  Location: [place];  Service: Orthopedics;  Laterality: Right;  . SHOULDER ARTHROSCOPY Right 12/20/2014   Procedure: RIGHT ARTHROSCOPY SHOULDER WITH DEBRIDEMENT;  Surgeon: Scott Nakayama, MD;  Location: [place];  Service: Orthopedics;  Laterality: Right;  . TESTICLE SURGERY     as teen  . TONSILLECTOMY    . WISDOM TOOTH EXTRACTION      Current Outpatient Prescriptions  Medication Sig Dispense Refill  . atorvastatin (LIPITOR) 40 MG tablet Take 40 mg by mouth daily.    . ferrous sulfate 325 (65 FE) MG tablet Take 325 mg by mouth daily with breakfast.    . Multiple Vitamins-Minerals (MULTIVITAMIN ADULTS) TABS Take 1 tablet by mouth daily.    . ONE TOUCH ULTRA TEST test strip      No current facility-administered medications for this visit.    Penicillins Family History  Problem  Relation Age of Onset  . Cancer Other   . Diabetes Other   . Hyperlipidemia Other   . Hypertension Other    Social History:   reports that he quit smoking about 3 years ago. He has never used smokeless tobacco. He reports that he drinks about 1.8 oz of alcohol per week . He reports that he does not use drugs.   REVIEW OF SYSTEMS : Negative except for C problem list  Physical Exam:   There were no vitals taken for this visit. There is no height or weight on file to calculate BMI.  Gen:  WDWN white male NAD  Neurological: Alert and oriented to person, place, and time. Motor and sensory function is grossly intact  Head: Normocephalic and atraumatic.  Eyes: Conjunctivae are normal. Pupils are equal, round, and reactive to light. No scleral icterus.  Neck: Normal range of motion. Neck supple. No tracheal deviation or thyromegaly present.  Cardiovascular:  SR without murmurs or gallops.  No carotid bruits Breast:  Not examined Respiratory: Effort normal.  No respiratory distress. No chest wall tenderness. Breath sounds normal.  No wheezes, rales or rhonchi.  Abdomen:  Mildly obese.  No overt PALPABLE masses noted. GU:  unremarkable Musculoskeletal: Normal range of motion. Extremities are nontender. No cyanosis, edema or clubbing noted Lymphadenopathy: No cervical, preauricular, postauricular or axillary adenopathy is present Skin: Skin is warm and dry. No rash noted. No diaphoresis.  No erythema. No pallor. Pscyh: Normal mood and affect. Behavior is normal. Judgment and thought content normal.   LABORATORY RESULTS: No results found for this or any previous visit (from the past 48 hour(s)).   RADIOLOGY RESULTS: No results found.  Problem List: Patient Active Problem List   Diagnosis Date Noted  . Chronic GI bleeding 05/29/2016  . Diabetes mellitus type 2, diet-controlled ([place]) 05/22/2016  . OSA (obstructive sleep apnea) 05/22/2016  . Hyperlipidemia 05/22/2016  . Anemia due to  blood loss, chronic   . 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  Plan laparoscopically or robotically assisted partial colectomy.  I've explained the procedure to him and his wife in some detail.  I would like to get this Gallagher as soon as possible.  He is having some pain on the right side.    Scott B. Hassell Done, MD, [facility] [phone] beeper [phone]  06/12/2016 2:29 PM

## 2016-06-15 NOTE — Patient Instructions (Addendum)
Scott Gallagher  06/15/2016   Your procedure is scheduled on: Wednesday 06/17/2016  Report to [facility] Main  Entrance take [place]  elevators to 3rd floor to  [place] at  [place] AM.  Call this number if you have problems the morning of surgery [phone]   Remember: ONLY 1 PERSON MAY GO WITH YOU TO SHORT STAY TO GET  READY MORNING OF [place].               FOLLOW BOWEL PREP INSTRUCTIONS FROM DR. MARTIN AND  CLEAR LIQUID DIET ALL DAY TODAY Tuesday 06/16/2016 TIL MIDNIGHT!              SEE CLEAR LIQUID LIST BELOW!    Do not eat food or drink liquids :After Midnight.      Take these medicines the morning of surgery with A SIP OF WATER: NONE                PLEASE BRING YOUR CPAP MASK AND TUBING WITH YOU TO THE HOSPITAL MORNING OF SURGERY!                                 You may not have any metal on your body including hair pins and              piercings  Do not wear jewelry, make-up, lotions, powders or perfumes, deodorant             Do not wear nail polish.  Do not shave  48 hours prior to surgery.              Men may shave face and neck.   Do not bring valuables to the hospital. [place].  Contacts, dentures or bridgework may not be worn into surgery.  Leave suitcase in the car. After surgery it may be brought to your room.                  Please read over the following fact sheets you were given: _____________________________________________________________________             [facility] - Preparing for Surgery Before surgery, you can play an important role.  Because skin is not sterile, your skin needs to be as free of germs as possible.  You can reduce the number of germs on your skin by washing with CHG (chlorahexidine gluconate) soap before surgery.  CHG is an antiseptic cleaner which kills germs and bonds with the skin to continue killing germs even after washing. Please DO  NOT use if you have an allergy to CHG or antibacterial soaps.  If your skin becomes reddened/irritated stop using the CHG and inform your nurse when you arrive at Short Stay. Do not shave (including legs and underarms) for at least 48 hours prior to the first CHG shower.  You may shave your face/neck. Please follow these instructions carefully:  1.  Shower with CHG Soap the night before surgery and the  morning of Surgery.  2.  If you choose to wash your hair, wash your hair first as usual with your  normal  shampoo.  3.  After you shampoo, rinse your hair and body thoroughly to remove the  shampoo.                           4.  Use CHG as you would any other liquid soap.  You can apply chg directly  to the skin and wash                       Gently with a scrungie or clean washcloth.  5.  Apply the CHG Soap to your body ONLY FROM THE NECK DOWN.   Do not use on face/ open                           Wound or open sores. Avoid contact with eyes, ears mouth and genitals (private parts).                       Wash face,  Genitals (private parts) with your normal soap.             6.  Wash thoroughly, paying special attention to the area where your surgery  will be performed.  7.  Thoroughly rinse your body with warm water from the neck down.  8.  DO NOT shower/wash with your normal soap after using and rinsing off  the CHG Soap.                9.  Pat yourself dry with a clean towel.            10.  Wear clean pajamas.            11.  Place clean sheets on your bed the night of your first shower and do not  sleep with pets. Day of Surgery : Do not apply any lotions/deodorants the morning of surgery.  Please wear clean clothes to the hospital/surgery center.  FAILURE TO FOLLOW THESE INSTRUCTIONS MAY RESULT IN THE CANCELLATION OF YOUR SURGERY PATIENT SIGNATURE_________________________________  NURSE  SIGNATURE__________________________________  ________________________________________________________________________    CLEAR LIQUID DIET   --------may have all day til midnight!   Foods Allowed                                                                     Foods Excluded  Coffee and tea, regular and decaf  ( NO creamer )                         liquids that you cannot  Plain Jell-O in any flavor                                             see through such as: Fruit ices (not with fruit pulp)                                     milk, soups, orange juice  Iced Popsicles  All solid food Carbonated beverages, regular and diet                                    Cranberry, grape and apple juices Sports drinks like Gatorade Lightly seasoned clear broth or consume(fat free) Sugar, honey syrup  Sample Menu Breakfast                                Lunch                                     Supper Cranberry juice                    Beef broth                            Chicken broth Jell-O                                     Grape juice                           Apple juice Coffee or tea                        Jell-O                                      Popsicle                                                Coffee or tea                        Coffee or tea  _____________________________________________________________________

## 2016-06-16 ENCOUNTER — Encounter: Payer: Self-pay | Admitting: Hematology & Oncology

## 2016-06-16 ENCOUNTER — Encounter (HOSPITAL_COMMUNITY): Payer: Self-pay

## 2016-06-16 ENCOUNTER — Encounter (HOSPITAL_COMMUNITY)
Admission: RE | Admit: 2016-06-16 | Discharge: 2016-06-16 | Disposition: A | Discharge status: Home / Self Care | Payer: 59 | Source: Ambulatory Visit | Attending: Surgery | Admitting: Surgery

## 2016-06-16 DIAGNOSIS — Z01818 Encounter for other preprocedural examination: Secondary | ICD-10-CM | POA: Insufficient documentation

## 2016-06-16 DIAGNOSIS — C787 Secondary malignant neoplasm of liver and intrahepatic bile duct: Secondary | ICD-10-CM | POA: Insufficient documentation

## 2016-06-16 DIAGNOSIS — C182 Malignant neoplasm of ascending colon: Secondary | ICD-10-CM

## 2016-06-16 LAB — CBC
HEMATOCRIT: 36.4 % — AB (ref 39.0–52.0)
HEMOGLOBIN: 10.6 g/dL — AB (ref 13.0–17.0)
MCH: 22.8 pg — AB (ref 26.0–34.0)
MCHC: 29.1 g/dL — AB (ref 30.0–36.0)
MCV: 78.4 fL (ref 78.0–100.0)
Platelets: 449 10*3/uL — ABNORMAL HIGH (ref 150–400)
RBC: 4.64 MIL/uL (ref 4.22–5.81)
WBC: 6.6 10*3/uL (ref 4.0–10.5)

## 2016-06-16 LAB — GLUCOSE, CAPILLARY: Glucose-Capillary: 99 mg/dL (ref 65–99)

## 2016-06-16 NOTE — Progress Notes (Signed)
06/08/2016- noted in EPIC- CT chest w/contrast. 05/29/2016- noted in EPIC-CBC w/diff., CMP,Iron and TIBC, Ferritin.

## 2016-06-16 NOTE — Anesthesia Preprocedure Evaluation (Addendum)
Anesthesia Evaluation  Patient identified by MRN, date of birth, ID band Patient awake    Reviewed: Allergy & Precautions, NPO status , Patient's Chart, lab work & pertinent test results  History of Anesthesia Complications Negative for: history of anesthetic complications  Airway Mallampati: III  TM Distance: >3 FB Neck ROM: Full    Dental no notable dental hx. (+) Teeth Intact, Dental Advisory Given   Pulmonary sleep apnea and Continuous Positive Airway Pressure Ventilation , former smoker,    Pulmonary exam normal        Cardiovascular negative cardio ROS Normal cardiovascular exam     Neuro/Psych negative neurological ROS  negative psych ROS   GI/Hepatic negative GI ROS, Neg liver ROS,   Endo/Other  diabetes, Type 2Morbid obesity  Renal/GU negative Renal ROS     Musculoskeletal   Abdominal   Peds  Hematology negative hematology ROS (+)   Anesthesia Other Findings   Reproductive/Obstetrics                            Anesthesia Physical  Anesthesia Plan  ASA: II  Anesthesia Plan: General   Post-op Pain Management:    Induction: Intravenous  Airway Management Planned: Oral ETT  Additional Equipment: None  Intra-op Plan:   Post-operative Plan: Extubation in OR  Informed Consent: I have reviewed the patients History and Physical, chart, labs and discussed the procedure including the risks, benefits and alternatives for the proposed anesthesia with the patient or authorized representative who has indicated his/her understanding and acceptance.   Dental advisory given  Plan Discussed with: CRNA, Anesthesiologist and Surgeon  Anesthesia Plan Comments: (ERAS protocol)      Anesthesia Quick Evaluation

## 2016-06-17 ENCOUNTER — Inpatient Hospital Stay (HOSPITAL_COMMUNITY): Payer: 59 | Admitting: Anesthesiology

## 2016-06-17 ENCOUNTER — Inpatient Hospital Stay (HOSPITAL_COMMUNITY)
Admission: RE | Admit: 2016-06-17 | Discharge: 2016-06-22 | DRG: 330 | Disposition: A | Discharge status: Home / Self Care | Payer: 59 | Source: Ambulatory Visit | Attending: Surgery | Admitting: Surgery

## 2016-06-17 ENCOUNTER — Encounter (HOSPITAL_COMMUNITY): Payer: Self-pay | Admitting: Certified Registered"

## 2016-06-17 ENCOUNTER — Encounter (HOSPITAL_COMMUNITY)
Admission: RE | Disposition: A | Discharge status: Home / Self Care | Payer: Self-pay | Source: Ambulatory Visit | Attending: Surgery

## 2016-06-17 DIAGNOSIS — G4730 Sleep apnea, unspecified: Secondary | ICD-10-CM | POA: Diagnosis present

## 2016-06-17 DIAGNOSIS — E669 Obesity, unspecified: Secondary | ICD-10-CM | POA: Diagnosis present

## 2016-06-17 DIAGNOSIS — C787 Secondary malignant neoplasm of liver and intrahepatic bile duct: Secondary | ICD-10-CM | POA: Diagnosis present

## 2016-06-17 DIAGNOSIS — C182 Malignant neoplasm of ascending colon: Secondary | ICD-10-CM | POA: Diagnosis present

## 2016-06-17 DIAGNOSIS — Z8249 Family history of ischemic heart disease and other diseases of the circulatory system: Secondary | ICD-10-CM | POA: Diagnosis not present

## 2016-06-17 DIAGNOSIS — Z6831 Body mass index (BMI) 31.0-31.9, adult: Secondary | ICD-10-CM

## 2016-06-17 DIAGNOSIS — E785 Hyperlipidemia, unspecified: Secondary | ICD-10-CM | POA: Diagnosis present

## 2016-06-17 DIAGNOSIS — Z833 Family history of diabetes mellitus: Secondary | ICD-10-CM | POA: Diagnosis not present

## 2016-06-17 DIAGNOSIS — C189 Malignant neoplasm of colon, unspecified: Secondary | ICD-10-CM | POA: Diagnosis present

## 2016-06-17 DIAGNOSIS — E119 Type 2 diabetes mellitus without complications: Secondary | ICD-10-CM | POA: Diagnosis present

## 2016-06-17 DIAGNOSIS — Z87891 Personal history of nicotine dependence: Secondary | ICD-10-CM

## 2016-06-17 HISTORY — PX: LAPAROSCOPIC RIGHT HEMI COLECTOMY: SHX5926

## 2016-06-17 LAB — CBC
HEMATOCRIT: 36.4 % — AB (ref 39.0–52.0)
HEMOGLOBIN: 10.9 g/dL — AB (ref 13.0–17.0)
MCH: 23.3 pg — ABNORMAL LOW (ref 26.0–34.0)
MCHC: 29.9 g/dL — AB (ref 30.0–36.0)
MCV: 77.9 fL — ABNORMAL LOW (ref 78.0–100.0)
Platelets: 446 10*3/uL — ABNORMAL HIGH (ref 150–400)
RBC: 4.67 MIL/uL (ref 4.22–5.81)
WBC: 16.7 10*3/uL — AB (ref 4.0–10.5)

## 2016-06-17 LAB — TYPE AND SCREEN
ABO/RH(D): B POS
Antibody Screen: NEGATIVE

## 2016-06-17 LAB — CREATININE, SERUM: Creatinine, Ser: 1.29 mg/dL — ABNORMAL HIGH (ref 0.61–1.24)

## 2016-06-17 LAB — HEMOGLOBIN A1C
Hgb A1c MFr Bld: 4.5 % — ABNORMAL LOW (ref 4.8–5.6)
Mean Plasma Glucose: 82 mg/dL

## 2016-06-17 LAB — GLUCOSE, CAPILLARY
GLUCOSE-CAPILLARY: 98 mg/dL (ref 65–99)
Glucose-Capillary: 119 mg/dL — ABNORMAL HIGH (ref 65–99)

## 2016-06-17 SURGERY — LAPAROSCOPIC RIGHT HEMI COLECTOMY
Anesthesia: General | Site: Abdomen | Laterality: Right

## 2016-06-17 MED ORDER — ONDANSETRON HCL 4 MG/2ML IJ SOLN
INTRAMUSCULAR | Status: DC | PRN
  Administered 2016-06-17: 4 mg via INTRAVENOUS

## 2016-06-17 MED ORDER — ACETAMINOPHEN 500 MG PO TABS
1000.0000 mg | ORAL_TABLET | ORAL | Status: AC
  Administered 2016-06-17: 1000 mg via ORAL
  Filled 2016-06-17: qty 2

## 2016-06-17 MED ORDER — ALVIMOPAN 12 MG PO CAPS
12.0000 mg | ORAL_CAPSULE | Freq: Once | ORAL | Status: AC
  Administered 2016-06-17: 12 mg via ORAL
  Filled 2016-06-17: qty 1

## 2016-06-17 MED ORDER — ONDANSETRON HCL 4 MG/2ML IJ SOLN: 4.0000 mg | Freq: Four times a day (QID) | INTRAMUSCULAR | Status: DC | PRN

## 2016-06-17 MED ORDER — DEXTROSE 5 % IV SOLN
2.0000 g | INTRAVENOUS | Status: AC
  Administered 2016-06-17: 2 g via INTRAVENOUS
  Filled 2016-06-17: qty 2

## 2016-06-17 MED ORDER — LACTATED RINGERS IV SOLN
INTRAVENOUS | Status: DC | PRN
  Administered 2016-06-17: 08:00:00 via INTRAVENOUS

## 2016-06-17 MED ORDER — LIDOCAINE 2% (20 MG/ML) 5 ML SYRINGE
INTRAMUSCULAR | Status: AC
  Filled 2016-06-17: qty 5

## 2016-06-17 MED ORDER — DEXAMETHASONE SODIUM PHOSPHATE 10 MG/ML IJ SOLN
INTRAMUSCULAR | Status: DC | PRN
  Administered 2016-06-17: 8 mg via INTRAVENOUS

## 2016-06-17 MED ORDER — CEFOTETAN DISODIUM-DEXTROSE 2-2.08 GM-% IV SOLR
INTRAVENOUS | Status: AC
  Filled 2016-06-17: qty 50

## 2016-06-17 MED ORDER — LIDOCAINE 2% (20 MG/ML) 5 ML SYRINGE
INTRAMUSCULAR | Status: AC
Start: 2016-06-17 — End: 2016-06-17
  Filled 2016-06-17: qty 5

## 2016-06-17 MED ORDER — FENTANYL CITRATE (PF) 100 MCG/2ML IJ SOLN
INTRAMUSCULAR | Status: DC | PRN
  Administered 2016-06-17: 100 ug via INTRAVENOUS
  Administered 2016-06-17 (×3): 50 ug via INTRAVENOUS

## 2016-06-17 MED ORDER — SUCCINYLCHOLINE CHLORIDE 20 MG/ML IJ SOLN
INTRAMUSCULAR | Status: AC
Start: 2016-06-17 — End: 2016-06-17
  Filled 2016-06-17: qty 1

## 2016-06-17 MED ORDER — ALBUMIN HUMAN 5 % IV SOLN
INTRAVENOUS | Status: DC | PRN
  Administered 2016-06-17: 10:00:00 via INTRAVENOUS

## 2016-06-17 MED ORDER — LACTATED RINGERS IR SOLN
Status: DC | PRN
  Administered 2016-06-17: 1000 mL

## 2016-06-17 MED ORDER — GABAPENTIN 300 MG PO CAPS
300.0000 mg | ORAL_CAPSULE | ORAL | Status: AC
  Administered 2016-06-17: 300 mg via ORAL
  Filled 2016-06-17: qty 1

## 2016-06-17 MED ORDER — SCOPOLAMINE 1 MG/3DAYS TD PT72
MEDICATED_PATCH | TRANSDERMAL | Status: DC | PRN
  Administered 2016-06-17: 1 via TRANSDERMAL

## 2016-06-17 MED ORDER — DIPHENHYDRAMINE HCL 50 MG/ML IJ SOLN
INTRAMUSCULAR | Status: AC
  Filled 2016-06-17: qty 1

## 2016-06-17 MED ORDER — KCL IN DEXTROSE-NACL 20-5-0.45 MEQ/L-%-% IV SOLN
INTRAVENOUS | Status: DC
  Administered 2016-06-17 – 2016-06-22 (×9): via INTRAVENOUS
  Filled 2016-06-17 (×11): qty 1000

## 2016-06-17 MED ORDER — HYDROMORPHONE HCL 2 MG/ML IJ SOLN
INTRAMUSCULAR | Status: AC
  Filled 2016-06-17: qty 1

## 2016-06-17 MED ORDER — FENTANYL CITRATE (PF) 250 MCG/5ML IJ SOLN
INTRAMUSCULAR | Status: AC
  Filled 2016-06-17: qty 5

## 2016-06-17 MED ORDER — SCOPOLAMINE 1 MG/3DAYS TD PT72
MEDICATED_PATCH | TRANSDERMAL | Status: AC
  Filled 2016-06-17: qty 1

## 2016-06-17 MED ORDER — ORAL CARE MOUTH RINSE
15.0000 mL | Freq: Two times a day (BID) | OROMUCOSAL | Status: DC
  Administered 2016-06-17 – 2016-06-20 (×5): 15 mL via OROMUCOSAL

## 2016-06-17 MED ORDER — DIPHENHYDRAMINE HCL 50 MG/ML IJ SOLN
INTRAMUSCULAR | Status: DC | PRN
  Administered 2016-06-17: 25 mg via INTRAVENOUS

## 2016-06-17 MED ORDER — LACTATED RINGERS IV SOLN: INTRAVENOUS | Status: DC

## 2016-06-17 MED ORDER — HYDROMORPHONE HCL 1 MG/ML IJ SOLN: 0.2500 mg | INTRAMUSCULAR | Status: DC | PRN

## 2016-06-17 MED ORDER — SUGAMMADEX SODIUM 200 MG/2ML IV SOLN
INTRAVENOUS | Status: AC
  Filled 2016-06-17: qty 2

## 2016-06-17 MED ORDER — ROCURONIUM BROMIDE 50 MG/5ML IV SOSY
PREFILLED_SYRINGE | INTRAVENOUS | Status: AC
  Filled 2016-06-17: qty 5

## 2016-06-17 MED ORDER — SODIUM CHLORIDE 0.9 % IJ SOLN
INTRAMUSCULAR | Status: AC
  Filled 2016-06-17: qty 50

## 2016-06-17 MED ORDER — ONDANSETRON HCL 4 MG/2ML IJ SOLN
INTRAMUSCULAR | Status: AC
  Filled 2016-06-17: qty 2

## 2016-06-17 MED ORDER — SODIUM CHLORIDE 0.9 % IJ SOLN
INTRAMUSCULAR | Status: DC | PRN
  Administered 2016-06-17: 20 mL

## 2016-06-17 MED ORDER — BUPIVACAINE LIPOSOME 1.3 % IJ SUSP
20.0000 mL | Freq: Once | INTRAMUSCULAR | Status: AC
  Administered 2016-06-17: 20 mL
  Filled 2016-06-17: qty 20

## 2016-06-17 MED ORDER — HEPARIN SODIUM (PORCINE) 5000 UNIT/ML IJ SOLN
5000.0000 [IU] | Freq: Three times a day (TID) | INTRAMUSCULAR | Status: DC
  Administered 2016-06-17 – 2016-06-22 (×14): 5000 [IU] via SUBCUTANEOUS
  Filled 2016-06-17 (×12): qty 1

## 2016-06-17 MED ORDER — PROPOFOL 10 MG/ML IV BOLUS
INTRAVENOUS | Status: DC | PRN
  Administered 2016-06-17: 200 mg via INTRAVENOUS

## 2016-06-17 MED ORDER — KETAMINE HCL 10 MG/ML IJ SOLN
INTRAMUSCULAR | Status: AC
  Filled 2016-06-17: qty 1

## 2016-06-17 MED ORDER — KETAMINE HCL 10 MG/ML IJ SOLN
INTRAMUSCULAR | Status: DC | PRN
  Administered 2016-06-17 (×4): 10 mg via INTRAVENOUS

## 2016-06-17 MED ORDER — CELECOXIB 200 MG PO CAPS
400.0000 mg | ORAL_CAPSULE | ORAL | Status: AC
  Administered 2016-06-17: 400 mg via ORAL
  Filled 2016-06-17: qty 2

## 2016-06-17 MED ORDER — OXYCODONE-ACETAMINOPHEN 5-325 MG PO TABS: 1.0000 | ORAL_TABLET | ORAL | Status: DC | PRN

## 2016-06-17 MED ORDER — LIDOCAINE 2% (20 MG/ML) 5 ML SYRINGE
INTRAMUSCULAR | Status: DC | PRN
  Administered 2016-06-17: 1.5 mg/kg/h via INTRAVENOUS

## 2016-06-17 MED ORDER — PROMETHAZINE HCL 25 MG/ML IJ SOLN: 6.2500 mg | INTRAMUSCULAR | Status: DC | PRN

## 2016-06-17 MED ORDER — MORPHINE SULFATE (PF) 2 MG/ML IV SOLN
1.0000 mg | INTRAVENOUS | Status: DC | PRN
  Administered 2016-06-17 – 2016-06-20 (×10): 1 mg via INTRAVENOUS
  Filled 2016-06-17 (×10): qty 1

## 2016-06-17 MED ORDER — MIDAZOLAM HCL 5 MG/5ML IJ SOLN
INTRAMUSCULAR | Status: DC | PRN
  Administered 2016-06-17: 2 mg via INTRAVENOUS

## 2016-06-17 MED ORDER — SUCCINYLCHOLINE CHLORIDE 200 MG/10ML IV SOSY
PREFILLED_SYRINGE | INTRAVENOUS | Status: DC | PRN
  Administered 2016-06-17: 120 mg via INTRAVENOUS

## 2016-06-17 MED ORDER — PROPOFOL 10 MG/ML IV BOLUS
INTRAVENOUS | Status: AC
  Filled 2016-06-17: qty 20

## 2016-06-17 MED ORDER — SACCHAROMYCES BOULARDII 250 MG PO CAPS
250.0000 mg | ORAL_CAPSULE | Freq: Two times a day (BID) | ORAL | Status: DC
  Administered 2016-06-17 – 2016-06-21 (×10): 250 mg via ORAL
  Filled 2016-06-17 (×10): qty 1

## 2016-06-17 MED ORDER — ONDANSETRON HCL 4 MG PO TABS: 4.0000 mg | ORAL_TABLET | Freq: Four times a day (QID) | ORAL | Status: DC | PRN

## 2016-06-17 MED ORDER — ALBUMIN HUMAN 5 % IV SOLN
INTRAVENOUS | Status: AC
  Filled 2016-06-17: qty 250

## 2016-06-17 MED ORDER — SUGAMMADEX SODIUM 200 MG/2ML IV SOLN
INTRAVENOUS | Status: DC | PRN
  Administered 2016-06-17: 200 mg via INTRAVENOUS

## 2016-06-17 MED ORDER — ALUM & MAG HYDROXIDE-SIMETH 200-200-20 MG/5ML PO SUSP: 30.0000 mL | Freq: Four times a day (QID) | ORAL | Status: DC | PRN

## 2016-06-17 MED ORDER — DEXAMETHASONE SODIUM PHOSPHATE 10 MG/ML IJ SOLN
INTRAMUSCULAR | Status: AC
  Filled 2016-06-17: qty 1

## 2016-06-17 MED ORDER — LIDOCAINE 2% (20 MG/ML) 5 ML SYRINGE: INTRAMUSCULAR | Status: DC | PRN

## 2016-06-17 MED ORDER — MIDAZOLAM HCL 2 MG/2ML IJ SOLN
INTRAMUSCULAR | Status: AC
  Filled 2016-06-17: qty 2

## 2016-06-17 MED ORDER — KCL IN DEXTROSE-NACL 20-5-0.45 MEQ/L-%-% IV SOLN
INTRAVENOUS | Status: AC
  Filled 2016-06-17: qty 1000

## 2016-06-17 MED ORDER — ROCURONIUM BROMIDE 10 MG/ML (PF) SYRINGE
PREFILLED_SYRINGE | INTRAVENOUS | Status: DC | PRN
  Administered 2016-06-17: 10 mg via INTRAVENOUS
  Administered 2016-06-17: 20 mg via INTRAVENOUS
  Administered 2016-06-17: 10 mg via INTRAVENOUS
  Administered 2016-06-17: 5 mg via INTRAVENOUS
  Administered 2016-06-17: 10 mg via INTRAVENOUS
  Administered 2016-06-17: 45 mg via INTRAVENOUS

## 2016-06-17 MED ORDER — LIDOCAINE 2% (20 MG/ML) 5 ML SYRINGE
INTRAMUSCULAR | Status: DC | PRN
  Administered 2016-06-17: 20 mg via INTRAVENOUS

## 2016-06-17 MED ORDER — DEXTROSE 5 % IV SOLN
2.0000 g | Freq: Two times a day (BID) | INTRAVENOUS | Status: AC
  Administered 2016-06-17: 2 g via INTRAVENOUS
  Filled 2016-06-17: qty 2

## 2016-06-17 MED ORDER — 0.9 % SODIUM CHLORIDE (POUR BTL) OPTIME
TOPICAL | Status: DC | PRN
  Administered 2016-06-17: 2000 mL

## 2016-06-17 SURGICAL SUPPLY — 60 items
APPLIER CLIP 5 13 M/L LIGAMAX5 (MISCELLANEOUS)
APPLIER CLIP ROT 10 11.4 M/L (STAPLE)
BLADE EXTENDED COATED 6.5IN (ELECTRODE) IMPLANT
CABLE HIGH FREQUENCY MONO STRZ (ELECTRODE) ×2 IMPLANT
CELLS DAT CNTRL 66122 CELL SVR (MISCELLANEOUS) ×1 IMPLANT
CHLORAPREP W/TINT 26ML (MISCELLANEOUS) IMPLANT
CLIP APPLIE 5 13 M/L LIGAMAX5 (MISCELLANEOUS) IMPLANT
CLIP APPLIE ROT 10 11.4 M/L (STAPLE) IMPLANT
COUNTER NEEDLE 20 DBL MAG RED (NEEDLE) ×2 IMPLANT
COVER MAYO STAND STRL (DRAPES) ×6 IMPLANT
COVER SURGICAL LIGHT HANDLE (MISCELLANEOUS) ×4 IMPLANT
DECANTER SPIKE VIAL GLASS SM (MISCELLANEOUS) IMPLANT
DRAPE LAPAROSCOPIC ABDOMINAL (DRAPES) ×2 IMPLANT
DRSG OPSITE POSTOP 4X10 (GAUZE/BANDAGES/DRESSINGS) IMPLANT
DRSG OPSITE POSTOP 4X6 (GAUZE/BANDAGES/DRESSINGS) ×2 IMPLANT
DRSG OPSITE POSTOP 4X8 (GAUZE/BANDAGES/DRESSINGS) IMPLANT
ELECT REM PT RETURN 15FT ADLT (MISCELLANEOUS) ×2 IMPLANT
GAUZE SPONGE 2X2 8PLY STRL LF (GAUZE/BANDAGES/DRESSINGS) ×1 IMPLANT
GAUZE SPONGE 4X4 12PLY STRL (GAUZE/BANDAGES/DRESSINGS) IMPLANT
GLOVE BIOGEL M 8.0 STRL (GLOVE) ×4 IMPLANT
GOWN STRL REUS W/TWL XL LVL3 (GOWN DISPOSABLE) ×12 IMPLANT
HANDLE STAPLE EGIA 4 XL (STAPLE) ×2 IMPLANT
IRRIG SUCT STRYKERFLOW 2 WTIP (MISCELLANEOUS) ×2
IRRIGATION SUCT STRKRFLW 2 WTP (MISCELLANEOUS) ×1 IMPLANT
LEGGING LITHOTOMY PAIR STRL (DRAPES) ×2 IMPLANT
LIGASURE IMPACT 36 18CM CVD LR (INSTRUMENTS) ×2 IMPLANT
PACK COLON (CUSTOM PROCEDURE TRAY) ×2 IMPLANT
PAD POSITIONING PINK XL (MISCELLANEOUS) ×2 IMPLANT
PORT LAP GEL ALEXIS MED 5-9CM (MISCELLANEOUS) IMPLANT
POSITIONER SURGICAL ARM (MISCELLANEOUS) ×4 IMPLANT
RELOAD EGIA 60 MED/THCK PURPLE (STAPLE) ×6 IMPLANT
RELOAD EGIA 60 TAN VASC (STAPLE) ×2 IMPLANT
RTRCTR WOUND ALEXIS 18CM MED (MISCELLANEOUS) ×2
SCISSORS LAP 5X45 EPIX DISP (ENDOMECHANICALS) ×2 IMPLANT
SEALER TISSUE X1 CVD JAW (INSTRUMENTS) IMPLANT
SHEARS CURVED HARMONIC AC 45CM (MISCELLANEOUS) ×2 IMPLANT
SLEEVE XCEL OPT CAN 5 100 (ENDOMECHANICALS) ×6 IMPLANT
SPONGE GAUZE 2X2 STER 10/PKG (GAUZE/BANDAGES/DRESSINGS) ×1
STAPLER VISISTAT 35W (STAPLE) ×2 IMPLANT
SUT CHROMIC 3 0 SH 27 (SUTURE) IMPLANT
SUT PDS AB 1 CTX 36 (SUTURE) IMPLANT
SUT PDS AB 1 TP1 96 (SUTURE) IMPLANT
SUT PDS AB 4-0 SH 27 (SUTURE) ×4 IMPLANT
SUT PROLENE 2 0 KS (SUTURE) IMPLANT
SUT SILK 2 0 (SUTURE) ×1
SUT SILK 2 0 SH CR/8 (SUTURE) ×2 IMPLANT
SUT SILK 2-0 18XBRD TIE 12 (SUTURE) ×1 IMPLANT
SUT SILK 3 0 (SUTURE) ×1
SUT SILK 3 0 SH CR/8 (SUTURE) ×2 IMPLANT
SUT SILK 3-0 18XBRD TIE 12 (SUTURE) ×1 IMPLANT
SUT VIC AB 4-0 SH 18 (SUTURE) ×2 IMPLANT
SYS LAPSCP GELPORT 120MM (MISCELLANEOUS)
SYSTEM LAPSCP GELPORT 120MM (MISCELLANEOUS) IMPLANT
TAPE CLOTH SURG 4X10 WHT LF (GAUZE/BANDAGES/DRESSINGS) ×2 IMPLANT
TOWEL OR NON WOVEN STRL DISP B (DISPOSABLE) ×2 IMPLANT
TRAY FOLEY W/METER SILVER 16FR (SET/KITS/TRAYS/PACK) ×2 IMPLANT
TROCAR BLADELESS OPT 5 100 (ENDOMECHANICALS) ×2 IMPLANT
TROCAR XCEL NON-BLD 11X100MML (ENDOMECHANICALS) IMPLANT
TUBING CONNECTING 10 (TUBING) IMPLANT
TUBING INSUF HEATED (TUBING) ×2 IMPLANT

## 2016-06-17 NOTE — Brief Op Note (Signed)
06/17/2016  11:43 AM  PATIENT:  Scott Gallagher  45 y.o. male  PRE-OPERATIVE DIAGNOSIS:  near obstructing mass of right colon  POST-OPERATIVE DIAGNOSIS:  near obstructing mass of right colon  PROCEDURE:  Procedure(s): LAPAROSCOPIC ASSISTED  RIGHT HEMI COLECTOMY (Right)  SURGEON:  Surgeon(s) and Role:    * [name], MD - Primary  PHYSICIAN ASSISTANT:   ASSISTANTS: none   ANESTHESIA:   general  EBL:  Total I/O In: 250 [IV Piggyback:250] Out: 200 [Urine:150; Blood:50]  BLOOD ADMINISTERED:none  DRAINS: none   LOCAL MEDICATIONS USED:  BUPIVICAINE   SPECIMEN:  Source of Specimen:  right colon and biopsy from right hepatic lobe  DISPOSITION OF SPECIMEN:  PATHOLOGY  COUNTS:  YES  TOURNIQUET:  * No tourniquets in log *  DICTATION: .Other Dictation: Dictation Number [id number]  PLAN OF CARE: Admit to inpatient   PATIENT DISPOSITION:  PACU - hemodynamically stable.   Delay start of Pharmacological VTE agent (>24hrs) due to surgical blood loss or risk of bleeding: no

## 2016-06-17 NOTE — Transfer of Care (Signed)
Immediate Anesthesia Transfer of Care Note  Patient: Scott Gallagher  Procedure(s) Performed: Procedure(s): LAPAROSCOPIC ASSISTED  RIGHT HEMI COLECTOMY (Right)  Patient Location: PACU  Anesthesia Type:General  Level of Consciousness:  sedated, patient cooperative and responds to stimulation  Airway & Oxygen Therapy:Patient Spontanous Breathing and Patient connected to face mask oxgen  Post-op Assessment:  Report given to PACU RN and Post -op Vital signs reviewed and stable  Post vital signs:  Reviewed and stable  Last Vitals:  Vitals:   06/17/16 0649  BP: 131/79  Pulse: 79  Resp: 18  Temp: 123XX123 C    Complications: No apparent anesthesia complications

## 2016-06-17 NOTE — H&P (View-Only) (Signed)
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 Lutricia Feil and then Dr. Oletta Lamas for anemia.  He was found to have a near obstructing mass of his ascending colon that on biopsy shows at least high-grade adenomatous dysplasia although it looks like it's very suspicious both grossly and microscopically for adenocarcinoma.  He was seen in the office today, October 27 and laparoscopically or robotically assisted right hemicolectomy was discussed with him.  Because he is having pain on the right side of the need to go ahead and move this up as soon as possible.  Told him to stay on a full liquid type diet.  We'll schedule this at [place] long.  Past Medical History:  Diagnosis Date  . Diabetes mellitus without complication (HCC)    diet controlled  . Hyperlipidemia   . Sleep apnea    cpap  . Wears glasses     Past Surgical History:  Procedure Laterality Date  . SHOULDER ACROMIOPLASTY Right 12/20/2014   Procedure: SHOULDER ACROMIOPLASTY;  Surgeon: Melrose Nakayama, MD;  Location: [place];  Service: Orthopedics;  Laterality: Right;  . SHOULDER ARTHROSCOPY Right 12/20/2014   Procedure: RIGHT ARTHROSCOPY SHOULDER WITH DEBRIDEMENT;  Surgeon: Melrose Nakayama, MD;  Location: [place];  Service: Orthopedics;  Laterality: Right;  . TESTICLE SURGERY     as teen  . TONSILLECTOMY    . WISDOM TOOTH EXTRACTION      Current Outpatient Prescriptions  Medication Sig Dispense Refill  . atorvastatin (LIPITOR) 40 MG tablet Take 40 mg by mouth daily.    . ferrous sulfate 325 (65 FE) MG tablet Take 325 mg by mouth daily with breakfast.    . Multiple Vitamins-Minerals (MULTIVITAMIN ADULTS) TABS Take 1 tablet by mouth daily.    . ONE TOUCH ULTRA TEST test strip      No current facility-administered medications for this visit.    Penicillins Family History  Problem  Relation Age of Onset  . Cancer Other   . Diabetes Other   . Hyperlipidemia Other   . Hypertension Other    Social History:   reports that he quit smoking about 3 years ago. He has never used smokeless tobacco. He reports that he drinks about 1.8 oz of alcohol per week . He reports that he does not use drugs.   REVIEW OF SYSTEMS : Negative except for C problem list  Physical Exam:   There were no vitals taken for this visit. There is no height or weight on file to calculate BMI.  Gen:  WDWN white male NAD  Neurological: Alert and oriented to person, place, and time. Motor and sensory function is grossly intact  Head: Normocephalic and atraumatic.  Eyes: Conjunctivae are normal. Pupils are equal, round, and reactive to light. No scleral icterus.  Neck: Normal range of motion. Neck supple. No tracheal deviation or thyromegaly present.  Cardiovascular:  SR without murmurs or gallops.  No carotid bruits Breast:  Not examined Respiratory: Effort normal.  No respiratory distress. No chest wall tenderness. Breath sounds normal.  No wheezes, rales or rhonchi.  Abdomen:  Mildly obese.  No overt PALPABLE masses noted. GU:  unremarkable Musculoskeletal: Normal range of motion. Extremities are nontender. No cyanosis, edema or clubbing noted Lymphadenopathy: No cervical, preauricular, postauricular or axillary adenopathy is present Skin: Skin is warm and dry. No rash noted. No diaphoresis.  No erythema. No pallor. Pscyh: Normal mood and affect. Behavior is normal. Judgment and thought content normal.   LABORATORY RESULTS: No results found for this or any previous visit (from the past 48 hour(s)).   RADIOLOGY RESULTS: No results found.  Problem List: Patient Active Problem List   Diagnosis Date Noted  . Chronic GI bleeding 05/29/2016  . Diabetes mellitus type 2, diet-controlled ([place]) 05/22/2016  . OSA (obstructive sleep apnea) 05/22/2016  . Hyperlipidemia 05/22/2016  . Anemia due to  blood loss, chronic   . 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  Plan laparoscopically or robotically assisted partial colectomy.  I've explained the procedure to him and his wife in some detail.  I would like to get this done as soon as possible.  He is having some pain on the right side.    Matt B. Hassell Done, MD, [facility] [phone] beeper [phone]  06/12/2016 2:29 PM

## 2016-06-17 NOTE — Anesthesia Procedure Notes (Signed)
Procedure Name: Intubation Date/Time: 06/17/2016 8:48 AM Performed by: Lajuana Carry E Pre-anesthesia Checklist: Patient identified, Emergency Drugs available, Suction available and Patient being monitored Patient Re-evaluated:Patient Re-evaluated prior to inductionOxygen Delivery Method: Circle system utilized Preoxygenation: Pre-oxygenation with 100% oxygen Intubation Type: IV induction Ventilation: Mask ventilation without difficulty Laryngoscope Size: Miller and 3 Grade View: Grade I Tube type: Oral Tube size: 7.5 mm Number of attempts: 1 Airway Equipment and Method: Stylet and Oral airway Placement Confirmation: ETT inserted through vocal cords under direct vision,  positive ETCO2 and breath sounds checked- equal and bilateral Secured at: 21 cm Tube secured with: Tape Dental Injury: Teeth and Oropharynx as per pre-operative assessment

## 2016-06-17 NOTE — Anesthesia Postprocedure Evaluation (Signed)
Anesthesia Post Note  Patient: Scott Gallagher  Procedure(s) Performed: Procedure(s) (LRB): LAPAROSCOPIC ASSISTED  RIGHT HEMI COLECTOMY (Right)  Patient location during evaluation: PACU Anesthesia Type: General Level of consciousness: sedated Pain management: pain level controlled Vital Signs Assessment: post-procedure vital signs reviewed and stable Respiratory status: spontaneous breathing and respiratory function stable Cardiovascular status: stable Anesthetic complications: no    Last Vitals:  Vitals:   06/17/16 1245 06/17/16 1304  BP: (!) 149/86 (!) 153/80  Pulse: 73 79  Resp: 12 12  Temp: 36.4 C 36.7 C    Last Pain:  Vitals:   06/17/16 1245  TempSrc:   PainSc: [place]

## 2016-06-17 NOTE — Interval H&P Note (Signed)
History and Physical Interval Note:  06/17/2016 8:17 AM  Scott Gallagher  has presented today for surgery, with the diagnosis of near obstructing mass of right colon  The various methods of treatment have been discussed with the patient and family. After consideration of risks, benefits and other options for treatment, the patient has consented to  Procedure(s): LAPAROSCOPIC ASSISTED  RIGHT HEMI COLECTOMY (Right) as a surgical intervention .  The patient's history has been reviewed, patient examined, no change in status, stable for surgery.  I have reviewed the patient's chart and labs.  Questions were answered to the patient's satisfaction.     [name] B

## 2016-06-18 LAB — CBC
HCT: 33.2 % — ABNORMAL LOW (ref 39.0–52.0)
HEMOGLOBIN: 10 g/dL — AB (ref 13.0–17.0)
MCH: 23.3 pg — AB (ref 26.0–34.0)
MCHC: 30.1 g/dL (ref 30.0–36.0)
MCV: 77.4 fL — ABNORMAL LOW (ref 78.0–100.0)
Platelets: 444 10*3/uL — ABNORMAL HIGH (ref 150–400)
RBC: 4.29 MIL/uL (ref 4.22–5.81)
WBC: 13.3 10*3/uL — ABNORMAL HIGH (ref 4.0–10.5)

## 2016-06-18 LAB — BASIC METABOLIC PANEL
ANION GAP: 8 (ref 5–15)
BUN: 13 mg/dL (ref 6–20)
CALCIUM: 9 mg/dL (ref 8.9–10.3)
CO2: 25 mmol/L (ref 22–32)
Chloride: 103 mmol/L (ref 101–111)
Creatinine, Ser: 0.97 mg/dL (ref 0.61–1.24)
GFR calc Af Amer: >60 mL/min (ref >60)
GFR calc non Af Amer: >60 mL/min (ref >60)
GLUCOSE: 120 mg/dL — AB (ref 65–99)
Potassium: 4.2 mmol/L (ref 3.5–5.1)
Sodium: 136 mmol/L (ref 135–145)

## 2016-06-18 MED ORDER — ALVIMOPAN 12 MG PO CAPS
12.0000 mg | ORAL_CAPSULE | Freq: Two times a day (BID) | ORAL | Status: DC
  Administered 2016-06-18 – 2016-06-19 (×3): 12 mg via ORAL
  Filled 2016-06-18 (×5): qty 1

## 2016-06-18 NOTE — Progress Notes (Signed)
Patient ID: Scott Gallagher, male   DOB: Aug 09, 1971, 45 y.o.   MRN: [id number] [facility] Surgery Progress Note:   1 Day Post-Op  Subjective: Mental status is alert.  I discussed the findings including the + liver biopsy Objective: Vital signs in last 24 hours: Temp:  [97.4 F (36.3 C)-98.7 F (37.1 C)] 97.7 F (36.5 C) (11/02 0519) Pulse Rate:  [73-97] 79 (11/02 0519) Resp:  [10-18] 16 (11/02 0519) BP: (108-158)/(56-90) 134/56 (11/02 0519) SpO2:  [95 %-100 %] 95 % (11/02 0519)  Intake/Output from previous day: 11/01 0701 - 11/02 0700 In: 2531.7 [I.V.:2231.7; IV Piggyback:300] Out: 1560 [Urine:1510; Blood:50] Intake/Output this shift: Total I/O In: -  Out: 800 [Urine:800]  Physical Exam: Work of breathing is normal.  Maybe a little flatus.    Lab Results:  Results for orders placed or performed during the hospital encounter of 06/17/16 (from the past 48 hour(s))  Glucose, capillary     Status: None   Collection Time: 06/17/16  6:18 AM  Result Value Ref Range   Glucose-Capillary 98 65 - 99 mg/dL   Comment 1 Notify RN   Glucose, capillary     Status: Abnormal   Collection Time: 06/17/16 12:07 PM  Result Value Ref Range   Glucose-Capillary 119 (H) 65 - 99 mg/dL  CBC     Status: Abnormal   Collection Time: 06/17/16  3:39 PM  Result Value Ref Range   WBC 16.7 (H) 4.0 - 10.5 K/uL   RBC 4.67 4.22 - 5.81 MIL/uL   Hemoglobin 10.9 (L) 13.0 - 17.0 g/dL   HCT 36.4 (L) 39.0 - 52.0 %   MCV 77.9 (L) 78.0 - 100.0 fL   MCH 23.3 (L) 26.0 - 34.0 pg   MCHC 29.9 (L) 30.0 - 36.0 g/dL   RDW NOT CALCULATED 11.5 - 15.5 %   Platelets 446 (H) 150 - 400 K/uL  Creatinine, serum     Status: Abnormal   Collection Time: 06/17/16  3:39 PM  Result Value Ref Range   Creatinine, Ser 1.29 (H) 0.61 - 1.24 mg/dL   GFR calc non Af Amer >60 >60 mL/min   GFR calc Af Amer >60 >60 mL/min    Comment: (NOTE) The eGFR has been calculated using the CKD EPI equation. This calculation has not been validated  in all clinical situations. eGFR's persistently <60 mL/min signify possible Chronic Kidney Disease.   Basic metabolic panel     Status: Abnormal   Collection Time: 06/18/16  4:20 AM  Result Value Ref Range   Sodium 136 135 - 145 mmol/L   Potassium 4.2 3.5 - 5.1 mmol/L   Chloride 103 101 - 111 mmol/L   CO2 25 22 - 32 mmol/L   Glucose, Bld 120 (H) 65 - 99 mg/dL   BUN 13 6 - 20 mg/dL   Creatinine, Ser 0.97 0.61 - 1.24 mg/dL   Calcium 9.0 8.9 - 10.3 mg/dL   GFR calc non Af Amer >60 >60 mL/min   GFR calc Af Amer >60 >60 mL/min    Comment: (NOTE) The eGFR has been calculated using the CKD EPI equation. This calculation has not been validated in all clinical situations. eGFR's persistently <60 mL/min signify possible Chronic Kidney Disease.    Anion gap 8 5 - 15  CBC     Status: Abnormal   Collection Time: 06/18/16  4:20 AM  Result Value Ref Range   WBC 13.3 (H) 4.0 - 10.5 K/uL   RBC 4.29 4.22 - 5.81  MIL/uL   Hemoglobin 10.0 (L) 13.0 - 17.0 g/dL   HCT 33.2 (L) 39.0 - 52.0 %   MCV 77.4 (L) 78.0 - 100.0 fL   MCH 23.3 (L) 26.0 - 34.0 pg   MCHC 30.1 30.0 - 36.0 g/dL   RDW NOT CALCULATED 11.5 - 15.5 %   Platelets 444 (H) 150 - 400 K/uL    Radiology/Results: No results found.  Anti-infectives: Anti-infectives    Start     Dose/Rate Route Frequency Ordered Stop   06/17/16 2100  cefoTEtan (CEFOTAN) 2 g in dextrose 5 % 50 mL IVPB     2 g 100 mL/hr over 30 Minutes Intravenous Every 12 hours 06/17/16 1338 06/17/16 2156   06/17/16 0620  cefoTEtan (CEFOTAN) 2 g in dextrose 5 % 50 mL IVPB     2 g 100 mL/hr over 30 Minutes Intravenous On call to O.R. 06/17/16 0620 06/17/16 0915      Assessment/Plan: Problem List: Patient Active Problem List   Diagnosis Date Noted  . 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 Chronic GI bleeding 05/29/2016  . Diabetes mellitus type 2, diet-controlled ([place]) 05/22/2016  . OSA (obstructive sleep apnea) 05/22/2016  . Hyperlipidemia  05/22/2016  . Anemia due to blood loss, chronic   . Anemia 05/21/2016    Will hold off on clears until more convincing flatus present.   1 Day Post-Op    LOS: 1 day   Matt B. Hassell Done, MD, [facility] [phone] beeper [phone]  06/18/2016 8:14 AM

## 2016-06-18 NOTE — Op Note (Signed)
NAMEKANO, LANCOUR               ACCOUNT NO.:  [id number]  MEDICAL RECORD NO.:  [id number]  LOCATION:  [facility]                         FACILITY:  [facility]  PHYSICIAN:  Isabel Caprice. Hassell Done, MD  DATE OF BIRTH:  05-19-1971  DATE OF PROCEDURE: DATE OF DISCHARGE:  06/22/2016                              OPERATIVE REPORT   PREOPERATIVE DIAGNOSIS:  Ascending colon cancer.  POSTOPERATIVE DIAGNOSIS:  Stage IV right colon cancer, status post laparoscopically assisted right hemicolectomy with right lobe of liver biopsy proving metastatic adenocarcinoma.  SURGEON:  Isabel Caprice. Hassell Done, MD.  ASSISTANT:  None.  ANESTHESIA:  General endotracheal.  DESCRIPTION OF PROCEDURE:  The patient is a 45 year old man, who taken to room 4 at [facility] on Wednesday, June 17, 2016 and given general anesthesia.  After he received 2 g of cefotetan and a bowel prep and after being placed asleep and after a time out, an access to the abdomen was achieved to the left upper quadrant with a 5 mm Optiview.  I placed a total of 4, 5-mm trocars, 1 in the midline, which were subsequently became my extraction incision and 2 others in the right upper quadrant.  Through those, I was able to mobilize the right colon using the Harmonic Scalpel and controlling bleeding.  However in the course of doing that, surveyed his liver and there was a nodule located, there was fairly prominent on the right lobe and it was sort of umbilicated.  I took a pinch biopsy of that and sent it for frozen section and this revealed adenocarcinoma.  In meantime, there was an area tattooed in the ascending colon as well as a mass effect.  I mobilized this and made a midline incision above the umbilicus.  Through this incision with a wound protector in place, I extracted the right colon.  I divided the terminal ileum and with an Endocutter GIA brown load.  Likewise, I had divided the transverse colon with a purple load. I divided the mesentery  using the LigaSure.  There were some palpable nodes deep in the root of the mesentery, some of which were obtained and others maybe remain behind, but this was more of a palliative resection at this point since he was near obstructing.  Attempts were made to get all the palpable nodes up into the specimen as I transected the mesentery with the LigaSure.  The specimen was removed.  The small bowel was then positioned up beside the transverse colon.  I put a tacking suture and it came down away from those and making ostomies on both colon and small bowel and then inserted the Covidien Endo-GIA 6 cm firing creating a common channel.  The common defect after inspecting the wound was closed from either end 1st with anchoring sutures of 3-0 silk and then with a running 4-0 PDS closure with a 2nd closure layer of 3-0 silk.  There was a broad mesenteric defect that was not addressed with attempts to close.  I irrigated and then we changed all the materials in the OR and we changed our gown and gloves.  I went back and closed the wound with a running double-stranded #1 PDS.  I  infiltrated Exparel into the incisions and I then reinflated the abdomen and surveyed.  I evacuated the irrigant.  There were everything appeared to be in order.  The fascial closure looked good from the inside.  We deflated the abdomen and I closed the wound with staples.  The patient seemed to tolerate the procedure well, was taken to recovery room in satisfactory condition.     Isabel Caprice Hassell Done, MD     MBM/MEDQ  D:  06/17/2016  T:  06/18/2016  Job:  [id number]  cc:   Jeneen Rinks L. Rolla Flatten., M.D. Fax: [phone]

## 2016-06-18 NOTE — Op Note (Deleted)
  The note originally documented on this encounter has been moved the the encounter in which it belongs.  

## 2016-06-19 DIAGNOSIS — C787 Secondary malignant neoplasm of liver and intrahepatic bile duct: Secondary | ICD-10-CM

## 2016-06-19 DIAGNOSIS — C189 Malignant neoplasm of colon, unspecified: Secondary | ICD-10-CM

## 2016-06-19 LAB — CBC
HEMATOCRIT: 32.9 % — AB (ref 39.0–52.0)
HEMOGLOBIN: 9.8 g/dL — AB (ref 13.0–17.0)
MCH: 23.7 pg — ABNORMAL LOW (ref 26.0–34.0)
MCHC: 29.8 g/dL — AB (ref 30.0–36.0)
MCV: 79.5 fL (ref 78.0–100.0)
Platelets: 402 10*3/uL — ABNORMAL HIGH (ref 150–400)
RBC: 4.14 MIL/uL — ABNORMAL LOW (ref 4.22–5.81)
WBC: 9.7 10*3/uL (ref 4.0–10.5)

## 2016-06-19 NOTE — Progress Notes (Addendum)
Onocology consult was received. Awaiting final pathology results. I plan to see patient today. Thanks  Sullivan Lone MD MS

## 2016-06-19 NOTE — Consult Note (Addendum)
[name]    HEMATOLOGY/ONCOLOGY CONSULTATION NOTE  Date of Service: 06/19/2016  Patient Care Team: [name], MD as PCP - General (Family Medicine)  CHIEF COMPLAINTS/PURPOSE OF CONSULTATION:  Newly diagnosed Colon Cancer  HISTORY OF PRESENTING ILLNESS:   Scott Gallagher is a wonderful 45 y.o. male who has been referred to [place] by Dr .[name], MD for evaluation and management of newly diagnosed colon cancer likely with metastases to the liver.  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 He noted some fatigue but no other acute symptoms or overt GI bleeding.  Patient required transfusion of 2 units of PRBCs and IV iron. He subsequently had a colonoscopy which showed a near obstructing mass of his ascending colon close to the cecum which appeared concerning for colon cancer.Biopsy showed atleast high grade adenomatous dysplasia. CT chest /abd/pelvis was done on 06/08/2016 and showed 3.7 cm in length area of narrowing of the ascending colon, which lies 2 cm above the ileocecal junction, initially felt to likely reflect an area of spasm, but consistent with a constricting lesion. There are 2 adjacent sub cm shotty mesenteric lymph nodes, the largest measuring 9 mm in short axis.12 mm low-density lesion in the anterior segment of the right lobe. Liver otherwise unremarkable.  Patient underwent laparoscopic assisted rt hemicolectomy on 06/17/2016 , liver was surveyed and an umbilicated nodule was noted on the rt lobe of liver and a biopsy of this was done that revealed adenocarcinoma on frozen section.  Patient final pathology results from his surgery is currently pending. He is recovering well from his surgery and has been flatus today and was started on clear  liquids. He notes that he ambulating a lot. Was encourage to continue using his incentive spirometry.  He reports no significant weight loss. No evidence of active overt GI bleeding. Wife Anderson Malta present during the interview.  We discussed the prelim pathology, likely diagnosis and staging based on frozen section findings of liver metastases.   MEDICAL HISTORY:  Past Medical History:  Diagnosis Date  . Diabetes mellitus without complication (HCC)    diet controlled  . Hyperlipidemia   . Sleep apnea    cpap  . Wears glasses     SURGICAL HISTORY: Past Surgical History:  Procedure Laterality Date  . LAPAROSCOPIC RIGHT HEMI COLECTOMY Right 06/17/2016   Procedure: LAPAROSCOPIC ASSISTED  RIGHT HEMI COLECTOMY;  Surgeon: [name], MD;  Location: [facility];  Service: General;  Laterality: Right;  . SHOULDER ACROMIOPLASTY Right 12/20/2014   Procedure: SHOULDER ACROMIOPLASTY;  Surgeon: Melrose Nakayama, MD;  Location: [facility];  Service: Orthopedics;  Laterality: Right;  . SHOULDER ARTHROSCOPY Right 12/20/2014   Procedure: RIGHT ARTHROSCOPY SHOULDER WITH DEBRIDEMENT;  Surgeon: Melrose Nakayama, MD;  Location: [facility];  Service: Orthopedics;  Laterality: Right;  . TESTICLE SURGERY     as teen  . TONSILLECTOMY    . WISDOM TOOTH EXTRACTION      SOCIAL HISTORY: Social History   Social History  . Marital status: Married    Spouse name: N/A  . Number of children: N/A  . Years of education: N/A   Occupational History  . cemetery maintenance    Social History Main Topics  . Smoking status: Former  Smoker    Quit date: 12/16/2012  . Smokeless tobacco: Never Used  . Alcohol use 1.8 oz/week    3 Shots of liquor per week     Comment: most days  . Drug use: No  . Sexual activity: Not on file   Other Topics Concern  . Not on file   Social History Narrative  . No narrative on file    FAMILY HISTORY: Family History  Problem Relation Age of Onset  .  Cancer Other   . Diabetes Other   . Hyperlipidemia Other   . Hypertension Other     ALLERGIES:  is allergic to penicillins.  MEDICATIONS:  Current Facility-Administered Medications  Medication Dose Route Frequency Provider Last Rate Last Dose  . alum & mag hydroxide-simeth (MAALOX/MYLANTA) 200-200-20 MG/5ML suspension 30 mL  30 mL Oral Q6H PRN [name], MD      . alvimopan (ENTEREG) capsule 12 mg  12 mg Oral BID [name], MD   12 mg at 06/18/16 2212  . dextrose 5 % and 0.45 % NaCl with KCl 20 mEq/L infusion   Intravenous Continuous [name], MD 100 mL/hr at 06/19/16 0600    . heparin injection 5,000 Units  5,000 Units Subcutaneous Q8H [name], MD   5,000 Units at 06/19/16 0537  . MEDLINE mouth rinse  15 mL Mouth Rinse BID [name], MD   15 mL at 06/18/16 2213  . morphine 2 MG/ML injection 1 mg  1 mg Intravenous Q1H PRN [name], MD   1 mg at 06/19/16 0357  . ondansetron (ZOFRAN) tablet 4 mg  4 mg Oral Q6H PRN [name], MD       Or  . ondansetron [facility]) injection 4 mg  4 mg Intravenous Q6H PRN [name], MD      . oxyCODONE-acetaminophen (PERCOCET/ROXICET) 5-325 MG per tablet 1-2 tablet  1-2 tablet Oral Q4H PRN [name], MD      . saccharomyces boulardii [facility]) capsule 250 mg  250 mg Oral BID [name], MD   250 mg at 06/18/16 2212    REVIEW OF SYSTEMS:    10 Point review of Systems was done is negative except as noted above.  PHYSICAL EXAMINATION: ECOG PERFORMANCE STATUS: 1 - Symptomatic but completely ambulatory  . Vitals:   06/18/16 2200 06/19/16 0540  BP:  129/66  Pulse:  74  Resp: 18 18  Temp:  98.1 F (36.7 C)   Filed Weights   06/17/16 0640  Weight: 244 lb (110.7 kg)   .Body mass index is 31.33 kg/m.  GENERAL:alert, in no acute distress and comfortable SKIN: skin color, texture, turgor are normal, no rashes or significant lesions EYES: normal, conjunctiva are pink and non-injected, sclera  clear OROPHARYNX:no exudate, no erythema and lips, buccal mucosa, and tongue normal  NECK: supple, no JVD, thyroid normal size, non-tender, without nodularity LYMPH:  no palpable lymphadenopathy in the cervical, axillary or inguinal LUNGS: clear to auscultation with normal respiratory effort HEART: regular rate & rhythm,  no murmurs and no lower extremity edema ABDOMEN: abdomen soft, mild tenderness over surgical incisions, bowel sounds normoactive. Musculoskeletal: no cyanosis of digits and no clubbing  PSYCH: alert & oriented x 3 with fluent speech NEURO: no focal motor/sensory deficits  LABORATORY DATA:  I have reviewed the data as listed  . CBC Latest Ref Rng & Units 06/19/2016 06/18/2016 06/17/2016  WBC 4.0 - 10.5 K/uL 9.7 13.3(H) 16.7(H)  Hemoglobin 13.0 - 17.0 g/dL 9.8(L) 10.0(L) 10.9(L)  Hematocrit 39.0 - 52.0 % 32.9(L) 33.2(L) 36.4(L)  Platelets 150 - 400 K/uL 402(H) 444(H) 446(H)   . CBC    Component Value Date/Time   WBC 9.7 06/19/2016 0427   RBC 4.14 (L) 06/19/2016 0427   HGB 9.8 (L) 06/19/2016 0427   HGB 9.1 (L) 05/29/2016 0853   HCT 32.9 (L) 06/19/2016 0427   HCT 32.0 (L) 05/29/2016 0853   PLT 402 (H) 06/19/2016 0427   PLT 325 05/29/2016 0853   MCV 79.5 06/19/2016 0427   MCV 75 (L) 05/29/2016 0853   MCH 23.7 (L) 06/19/2016 0427   MCHC 29.8 (L) 06/19/2016 0427   RDW NOT CALCULATED 06/19/2016 0427   RDW 31.4 (H) 05/29/2016 0853   LYMPHSABS 1.7 05/29/2016 0853   EOSABS 0.2 05/29/2016 0853   BASOSABS 0.1 05/29/2016 0853    . CMP Latest Ref Rng & Units 06/18/2016 06/17/2016 05/29/2016  Glucose 65 - 99 mg/dL 120(H) - 93  BUN 6 - 20 mg/dL 13 - 15.4  Creatinine 0.61 - 1.24 mg/dL 0.97 1.29(H) 0.9  Sodium 135 - 145 mmol/L 136 - 140  Potassium 3.5 - 5.1 mmol/L 4.2 - 4.6  Chloride 101 - 111 mmol/L 103 - -  CO2 22 - 32 mmol/L 25 - 25  Calcium 8.9 - 10.3 mg/dL 9.0 - 9.2  Total Protein 6.4 - 8.3 g/dL - - 7.0  Total Bilirubin 0.20 - 1.20 mg/dL - - 0.47  Alkaline Phos  40 - 150 U/L - - 72  AST 5 - 34 U/L - - 22  ALT 0 - 55 U/L - - 23     RADIOGRAPHIC STUDIES: I have personally reviewed the radiological images as listed and agreed with the findings in the report. Ct Chest W Contrast  Addendum Date: 06/08/2016   ADDENDUM REPORT: 06/08/2016 09:52 ADDENDUM: The patient underwent a colonoscopy to evaluate his severe anemia. A constricting lesion was noted in the right colon. Given this information, there is a 3.7 cm in length area of narrowing of the ascending colon, which lies 2 cm above the ileocecal junction, initially felt to likely reflect an area of spasm, but consistent with a constricting lesion. There are 2 adjacent sub cm shotty mesenteric lymph nodes, the largest measuring 9 mm in short axis. Electronically Signed   By: Lajean Manes M.D.   On: 06/08/2016 09:52   Result Date: 06/08/2016 CLINICAL DATA:  Anemia from chronic blood loss. Patient has become weaker over the last month. EXAM: CT CHEST, ABDOMEN, AND PELVIS WITH CONTRAST TECHNIQUE: Multidetector CT imaging of the chest, abdomen and pelvis was performed following the standard protocol during bolus administration of intravenous contrast. CONTRAST:  150m ISOVUE-300 IOPAMIDOL (ISOVUE-300) INJECTION 61% COMPARISON:  None. FINDINGS: CT CHEST FINDINGS Cardiovascular: Heart is normal in size and configuration. No coronary artery calcifications. Great vessels normal in caliber. No aortic atherosclerosis. Mediastinum/Nodes: No enlarged mediastinal, hilar, or axillary lymph nodes. Thyroid gland, trachea, and esophagus demonstrate no significant findings. Lungs/Pleura: Minor subsegmental atelectasis in the right lower lobe. Lungs otherwise clear. No pleural effusion or pneumothorax. Musculoskeletal: No chest wall abnormality. No acute or significant osseous findings. CT ABDOMEN PELVIS FINDINGS Hepatobiliary: 12 mm low-density lesion in the anterior segment of the right lobe. Liver otherwise unremarkable. Normal  gallbladder. No bile duct dilation. Pancreas: Unremarkable. No pancreatic ductal dilatation or surrounding inflammatory changes. Spleen: Normal in size without focal abnormality. Adrenals/Urinary Tract: No adrenal masses. Single low-density masses in each kidney measuring 13 mm, in the mid to lower pole on  each side, consistent with cysts. No other renal masses, no stones and no hydronephrosis. Normal ureters. Normal bladder. Stomach/Bowel: Stomach is within normal limits. Appendix appears normal. No evidence of bowel wall thickening, distention, or inflammatory changes. Vascular/Lymphatic: Minor inferior aorta atherosclerotic calcifications. No aneurysm. No adenopathy. Reproductive: Prostate is unremarkable. Other: No abdominal wall hernia or abnormality. No abdominopelvic ascites. Musculoskeletal: Unremarkable. IMPRESSION: 1. No acute findings within the chest, abdomen or pelvis. 2. No findings to account for chronic blood loss. 3. 12 mm low-density liver lesion. This is not as low in attenuation as would be expected for a cyst. The lesion is nonspecific. This could be further assessed with liver MRI with and without contrast. 4. Single renal cyst bilaterally. 5. Minor aortic atherosclerosis. Electronically Signed: By: Lajean Manes M.D. On: 05/23/2016 14:05   US Abdomen Complete  Result Date: 06/03/2016 CLINICAL DATA:  Epigastric pain EXAM: ABDOMEN ULTRASOUND COMPLETE COMPARISON:  CT abdomen pelvis 05/23/2016 FINDINGS: Gallbladder: No gallstones or wall thickening visualized. No sonographic Murphy sign noted by sonographer. Common bile duct: Diameter: 2.8 mm Liver: No focal lesion identified. Within normal limits in parenchymal echogenicity. IVC: No abnormality visualized. Pancreas: Visualized portion unremarkable. Spleen: Size and appearance within normal limits. Right Kidney: Length: 11.7 cm. 12 mm midpole cyst. Echogenicity within normal limits. No mass or hydronephrosis visualized. Left Kidney: Length:  12.9 cm. 17 mm midpole cyst. Echogenicity within normal limits. No mass or hydronephrosis visualized. Abdominal aorta: No aneurysm visualized. Other findings: None. IMPRESSION: Negative abdominal ultrasound. Electronically Signed   By: Franchot Gallo M.D.   On: 06/03/2016 11:34   Ct Abdomen Pelvis W Contrast  Addendum Date: 06/08/2016   ADDENDUM REPORT: 06/08/2016 09:52 ADDENDUM: The patient underwent a colonoscopy to evaluate his severe anemia. A constricting lesion was noted in the right colon. Given this information, there is a 3.7 cm in length area of narrowing of the ascending colon, which lies 2 cm above the ileocecal junction, initially felt to likely reflect an area of spasm, but consistent with a constricting lesion. There are 2 adjacent sub cm shotty mesenteric lymph nodes, the largest measuring 9 mm in short axis. Electronically Signed   By: Lajean Manes M.D.   On: 06/08/2016 09:52   Result Date: 06/08/2016 CLINICAL DATA:  Anemia from chronic blood loss. Patient has become weaker over the last month. EXAM: CT CHEST, ABDOMEN, AND PELVIS WITH CONTRAST TECHNIQUE: Multidetector CT imaging of the chest, abdomen and pelvis was performed following the standard protocol during bolus administration of intravenous contrast. CONTRAST:  184m ISOVUE-300 IOPAMIDOL (ISOVUE-300) INJECTION 61% COMPARISON:  None. FINDINGS: CT CHEST FINDINGS Cardiovascular: Heart is normal in size and configuration. No coronary artery calcifications. Great vessels normal in caliber. No aortic atherosclerosis. Mediastinum/Nodes: No enlarged mediastinal, hilar, or axillary lymph nodes. Thyroid gland, trachea, and esophagus demonstrate no significant findings. Lungs/Pleura: Minor subsegmental atelectasis in the right lower lobe. Lungs otherwise clear. No pleural effusion or pneumothorax. Musculoskeletal: No chest wall abnormality. No acute or significant osseous findings. CT ABDOMEN PELVIS FINDINGS Hepatobiliary: 12 mm low-density  lesion in the anterior segment of the right lobe. Liver otherwise unremarkable. Normal gallbladder. No bile duct dilation. Pancreas: Unremarkable. No pancreatic ductal dilatation or surrounding inflammatory changes. Spleen: Normal in size without focal abnormality. Adrenals/Urinary Tract: No adrenal masses. Single low-density masses in each kidney measuring 13 mm, in the mid to lower pole on each side, consistent with cysts. No other renal masses, no stones and no hydronephrosis. Normal ureters. Normal bladder. Stomach/Bowel: Stomach is within  normal limits. Appendix appears normal. No evidence of bowel wall thickening, distention, or inflammatory changes. Vascular/Lymphatic: Minor inferior aorta atherosclerotic calcifications. No aneurysm. No adenopathy. Reproductive: Prostate is unremarkable. Other: No abdominal wall hernia or abnormality. No abdominopelvic ascites. Musculoskeletal: Unremarkable. IMPRESSION: 1. No acute findings within the chest, abdomen or pelvis. 2. No findings to account for chronic blood loss. 3. 12 mm low-density liver lesion. This is not as low in attenuation as would be expected for a cyst. The lesion is nonspecific. This could be further assessed with liver MRI with and without contrast. 4. Single renal cyst bilaterally. 5. Minor aortic atherosclerosis. Electronically Signed: By: Lajean Manes M.D. 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 Single rt liver lobe 64m lesion biopsied with frozen section consistent with adenocarcinoma. 2) Iron deficiency anemia with previous PRBC transfusion and IV iron Plan -will get CEA tomorrow AM to have baseline post-operative CEA level. -awaiting final pathology results from his lap assisted rt hemicolectomy done on 06/17/2016. -he is passing flatus and has been started on clear liquids. Diet advancement as per  surgery. -will check ferritin and Iron profile tomorrow AM to determine need for additional IV iron given post-op anemia and microcytic indices. -I discussed the likely diagnosis, staging, prognosis, treatment strategy. -we will need to get PET/CT in 3-4 weeks after surgery to complete staging. -if only oligometastatic liver met (as appears to be the case) would treatment with FOLFOX for 3 months and then consider possible liver metastatectomy if residual lesion and no other metastatic progression followed by completion of additional 3 months of FOLFOX. -will need port (will request Dr MHassell Doneto help with this if possible, if not could have IR place this) -patient was recommended to continue followup with Dr EJonette Evawho he had seen for anemia previously but [facility] and notes that this would be much easer for him.  I gave him the option to f/u with me in clinic in about 3 weeks for additional treatment planning as he heals from his surgery. I shall help setup his medical oncology followup -if patient is not discharged over the weekend I shall followup with him on Monday when we get final pathology results from surgery.  #2 DM2 - diet controlled.  #3 Sleep apnea - currently uses CPAP during night time.  I appreciate the opportunity to take care of this wonderful individual.  All of the patients questions were answered with apparent satisfaction. The patient knows to call the clinic with any problems, questions or concerns.  I spent 70 minutes counseling the patient face to face. The total time spent in the appointment was 80 minutes and more than 50% was on counseling and direct patient cares.    GSullivan LoneMD MBaileys HarborAAHIVMS SPresence Chicago Hospit[facility] D[facility] Presence Saint Elizabeth HospitalCGlendale Adventist Medical Center - Wilson TerraceHematology/Oncology Physician CVirginia Mason [facility] (Office):       [phone](Work cell):  [phone](Fax):           [phone] 06/19/2016 9:06 AM

## 2016-06-20 LAB — IRON AND TIBC
IRON: 30 ug/dL — AB (ref 45–182)
SATURATION RATIOS: 8 % — AB (ref 17.9–39.5)
TIBC: 385 ug/dL (ref 250–450)
UIBC: 355 ug/dL

## 2016-06-20 LAB — GLUCOSE, CAPILLARY
Glucose-Capillary: 113 mg/dL — ABNORMAL HIGH (ref 65–99)
Glucose-Capillary: 89 mg/dL (ref 65–99)
Glucose-Capillary: 92 mg/dL (ref 65–99)
Glucose-Capillary: 98 mg/dL (ref 65–99)

## 2016-06-20 LAB — FERRITIN: Ferritin: 30 ng/mL (ref 24–336)

## 2016-06-20 MED ORDER — INSULIN ASPART 100 UNIT/ML ~~LOC~~ SOLN: 0.0000 [IU] | SUBCUTANEOUS | Status: DC

## 2016-06-20 NOTE — Progress Notes (Signed)
Patient ID: Scott Gallagher, male   DOB: 1970/09/15, 45 y.o.   MRN: [id number] [facility] Surgery Progress Note:   3 Days Post-Op  Subjective: Mental status is clear.  Having BMs Objective: Vital signs in last 24 hours: Temp:  [97.9 F (36.6 C)-98.3 F (36.8 C)] 98.2 F (36.8 C) (11/04 0516) Pulse Rate:  [72-81] 72 (11/04 0516) Resp:  [18] 18 (11/04 0516) BP: (119-126)/(58-74) 126/74 (11/04 0516) SpO2:  [94 %-98 %] 98 % (11/04 0516)  Intake/Output from previous day: 11/03 0701 - 11/04 0700 In: 3230 [P.O.:840; I.V.:2390] Out: 4900 [Urine:4900] Intake/Output this shift: Total I/O In: 410 [I.V.:410] Out: 1000 [Urine:1000]  Physical Exam: Work of breathing is normal.  Incisions oK  Lab Results:  Results for orders placed or performed during the hospital encounter of 06/17/16 (from the past 48 hour(s))  CBC     Status: Abnormal   Collection Time: 06/19/16  4:27 AM  Result Value Ref Range   WBC 9.7 4.0 - 10.5 K/uL   RBC 4.14 (L) 4.22 - 5.81 MIL/uL   Hemoglobin 9.8 (L) 13.0 - 17.0 g/dL   HCT 32.9 (L) 39.0 - 52.0 %   MCV 79.5 78.0 - 100.0 fL   MCH 23.7 (L) 26.0 - 34.0 pg   MCHC 29.8 (L) 30.0 - 36.0 g/dL   RDW NOT CALCULATED 11.5 - 15.5 %   Platelets 402 (H) 150 - 400 K/uL    Radiology/Results: No results found.  Anti-infectives: Anti-infectives    Start     Dose/Rate Route Frequency Ordered Stop   06/17/16 2100  cefoTEtan (CEFOTAN) 2 g in dextrose 5 % 50 mL IVPB     2 g 100 mL/hr over 30 Minutes Intravenous Every 12 hours 06/17/16 1338 06/17/16 2156   06/17/16 0620  cefoTEtan (CEFOTAN) 2 g in dextrose 5 % 50 mL IVPB     2 g 100 mL/hr over 30 Minutes Intravenous On call to O.R. 06/17/16 0620 06/17/16 0915      Assessment/Plan: Problem List: Patient Active Problem List   Diagnosis Date Noted  . Right Colon cancer metastasized to liver ([place]) 06/17/2016  . Chronic GI bleeding 05/29/2016  . Diabetes mellitus type 2, diet-controlled ([place]) 05/22/2016  . OSA  (obstructive sleep apnea) 05/22/2016  . Hyperlipidemia 05/22/2016  . Anemia due to blood loss, chronic   . Anemia 05/21/2016    Doing well postop.  Advance diet to full 3 Days Post-Op    LOS: 3 days   Matt B. Hassell Done, MD, [facility] [phone] beeper [phone]  06/20/2016 11:10 AM

## 2016-06-20 NOTE — Progress Notes (Signed)
Pharmacy Brief Note - Alvimopan (Entereg)  The standing order set for alvimopan (Entereg) now includes an automatic order to discontinue the drug after the patient has had a bowel movement. The change was approved by the [place] and the Medical Executive Committee.   This patient has had bowel movements documented by nursing. Therefore, alvimopan has been discontinued. If there are questions, please contact the pharmacy at [phone].   Thank you- Dolly Rias RPh 06/20/2016, 1:13 PM Pager [phone]

## 2016-06-21 LAB — GLUCOSE, CAPILLARY
GLUCOSE-CAPILLARY: 103 mg/dL — AB (ref 65–99)
GLUCOSE-CAPILLARY: 114 mg/dL — AB (ref 65–99)
GLUCOSE-CAPILLARY: 126 mg/dL — AB (ref 65–99)
Glucose-Capillary: 103 mg/dL — ABNORMAL HIGH (ref 65–99)
Glucose-Capillary: 73 mg/dL (ref 65–99)

## 2016-06-21 LAB — CEA: CEA: 6.1 ng/mL — AB (ref 0.0–4.7)

## 2016-06-21 MED ORDER — MORPHINE SULFATE (PF) 4 MG/ML IV SOLN: 1.0000 mg | INTRAVENOUS | Status: DC | PRN

## 2016-06-21 NOTE — Progress Notes (Signed)
Patient ID: Scott Gallagher, male   DOB: 11/28/1970, 45 y.o.   MRN: [id number] [facility] Surgery Progress Note:   4 Days Post-Op  Subjective: Mental status is clear.  Doing well Objective: Vital signs in last 24 hours: Temp:  [97.9 F (36.6 C)-98.3 F (36.8 C)] 97.9 F (36.6 C) (11/05 0505) Pulse Rate:  [71-88] 71 (11/05 0505) Resp:  [16-18] 17 (11/05 0505) BP: (128-136)/(61-80) 130/61 (11/05 0505) SpO2:  [99 %-100 %] 99 % (11/05 0505)  Intake/Output from previous day: 11/04 0701 - 11/05 0700 In: 2250 [P.O.:1440; I.V.:810] Out: 3550 [Urine:3550] Intake/Output this shift: Total I/O In: -  Out: 600 [Urine:600]  Physical Exam: Work of breathing is normal.  Incisions OK  Lab Results:  Results for orders placed or performed during the hospital encounter of 06/17/16 (from the past 48 hour(s))  Ferritin     Status: None   Collection Time: 06/20/16  5:17 AM  Result Value Ref Range   Ferritin 30 24 - 336 ng/mL    Comment: Performed at [facility]  Iron and TIBC     Status: Abnormal   Collection Time: 06/20/16  5:17 AM  Result Value Ref Range   Iron 30 (L) 45 - 182 ug/dL   TIBC 385 250 - 450 ug/dL   Saturation Ratios 8 (L) 17.9 - 39.5 %   UIBC 355 ug/dL    Comment: Performed at [facility]  Glucose, capillary     Status: None   Collection Time: 06/20/16 12:03 PM  Result Value Ref Range   Glucose-Capillary 89 65 - 99 mg/dL  Glucose, capillary     Status: Abnormal   Collection Time: 06/20/16  4:58 PM  Result Value Ref Range   Glucose-Capillary 113 (H) 65 - 99 mg/dL  Glucose, capillary     Status: None   Collection Time: 06/20/16  8:19 PM  Result Value Ref Range   Glucose-Capillary 92 65 - 99 mg/dL  Glucose, capillary     Status: None   Collection Time: 06/20/16 11:42 PM  Result Value Ref Range   Glucose-Capillary 98 65 - 99 mg/dL  Glucose, capillary     Status: Abnormal   Collection Time: 06/21/16  5:11 AM  Result Value Ref Range   Glucose-Capillary  103 (H) 65 - 99 mg/dL  Glucose, capillary     Status: Abnormal   Collection Time: 06/21/16  7:37 AM  Result Value Ref Range   Glucose-Capillary 103 (H) 65 - 99 mg/dL    Radiology/Results: No results found.  Anti-infectives: Anti-infectives    Start     Dose/Rate Route Frequency Ordered Stop   06/17/16 2100  cefoTEtan (CEFOTAN) 2 g in dextrose 5 % 50 mL IVPB     2 g 100 mL/hr over 30 Minutes Intravenous Every 12 hours 06/17/16 1338 06/17/16 2156   06/17/16 0620  cefoTEtan (CEFOTAN) 2 g in dextrose 5 % 50 mL IVPB     2 g 100 mL/hr over 30 Minutes Intravenous On call to O.R. 06/17/16 0620 06/17/16 0915      Assessment/Plan: Problem List: Patient Active Problem List   Diagnosis Date Noted  . Right Colon cancer metastasized to liver ([place]) 06/17/2016  . Chronic GI bleeding 05/29/2016  . Diabetes mellitus type 2, diet-controlled ([place]) 05/22/2016  . OSA (obstructive sleep apnea) 05/22/2016  . Hyperlipidemia 05/22/2016  . Anemia due to blood loss, chronic   . Anemia 05/21/2016    Will advance to regular diet.   4 Days Post-Op  LOS: 4 days   Matt B. Hassell Done, MD, [facility] [phone] beeper [phone]  06/21/2016 9:30 AM

## 2016-06-22 ENCOUNTER — Other Ambulatory Visit: Payer: Self-pay | Admitting: *Deleted

## 2016-06-22 DIAGNOSIS — C189 Malignant neoplasm of colon, unspecified: Secondary | ICD-10-CM

## 2016-06-22 DIAGNOSIS — C787 Secondary malignant neoplasm of liver and intrahepatic bile duct: Principal | ICD-10-CM

## 2016-06-22 LAB — GLUCOSE, CAPILLARY
GLUCOSE-CAPILLARY: 126 mg/dL — AB (ref 65–99)
Glucose-Capillary: 102 mg/dL — ABNORMAL HIGH (ref 65–99)

## 2016-06-22 MED ORDER — OXYCODONE-ACETAMINOPHEN 5-325 MG PO TABS: 1.0000 | ORAL_TABLET | ORAL | 0 refills | Status: DC | PRN

## 2016-06-22 NOTE — Progress Notes (Signed)
Staples removed and steri strips applied as ordered.  Discharge instructions discussed with patient and wife, verbalized understanding and agreement, prescriptions given to patient

## 2016-06-22 NOTE — Discharge Summary (Signed)
Physician Discharge Summary  Patient ID: Scott Gallagher MRN: [id number] DOB/AGE: 04-17-1971 45 y.o.  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  Began bowel function.  Diet advanced.  Incisions OK Ready for discharge  Consults: med oncology  Significant Diagnostic Studies: path pending    Discharge Exam: Blood pressure 123/83, pulse 100, temperature 97.7 F (36.5 C), temperature source Oral, resp. rate 15, height 6\' 2"  (1.88 m), weight 110.7 kg (244 lb), SpO2 98 %. Incisions OK.  Will remove staples before discharge  Disposition: 01-Home or Self Care  Discharge Instructions    Call MD for:  redness, tenderness, or signs of infection (pain, swelling, redness, odor or green/yellow discharge around incision site)    Complete by:  As directed    Diet - low sodium heart healthy    Complete by:  As directed    Discharge instructions    Complete by:  As directed    May shower tomorrow   Increase activity slowly    Complete by:  As directed        Medication List    TAKE these medications   atorvastatin 40 MG tablet Commonly known as:  LIPITOR Take 40 mg by mouth daily.   oxyCODONE-acetaminophen 5-325 MG tablet Commonly known as:  PERCOCET/ROXICET Take 1-2 tablets by mouth every 4 (four) hours as needed for moderate pain.      Follow-up Information    Pedro Earls, MD Follow up.   Specialty:  General Surgery Contact information: [address] [place] [address] [phone]           Signed: Pedro Earls 06/22/2016, 7:57 AM

## 2016-06-25 ENCOUNTER — Telehealth: Payer: Self-pay | Admitting: Hematology

## 2016-06-25 ENCOUNTER — Telehealth: Payer: Self-pay | Admitting: *Deleted

## 2016-06-25 NOTE — Telephone Encounter (Signed)
Referring office called saying the pt needs to be seen sooner than 12/5. Appt rescheduled w/referring office for 11/13 at 9am w/[place].

## 2016-06-25 NOTE — Telephone Encounter (Signed)
Called pt to follow up on his decision to resume care with Dr. Marin Olp or Dr. Irene Limbo.  Pt stated he would prefer to see Dr. Irene Limbo due to location.  Scheduling message sent to schedule pt at 1415 on 11/13 per Dr. Irene Limbo.  Pt aware of date/time.

## 2016-06-29 ENCOUNTER — Encounter: Payer: Self-pay | Admitting: Hematology

## 2016-06-29 ENCOUNTER — Ambulatory Visit (HOSPITAL_BASED_OUTPATIENT_CLINIC_OR_DEPARTMENT_OTHER): Payer: 59 | Admitting: Hematology

## 2016-06-29 ENCOUNTER — Telehealth: Payer: Self-pay | Admitting: Hematology

## 2016-06-29 VITALS — BP 109/66 | HR 77 | Temp 98.1°F | Resp 18 | Ht 74.0 in | Wt 244.4 lb

## 2016-06-29 DIAGNOSIS — C182 Malignant neoplasm of ascending colon: Secondary | ICD-10-CM

## 2016-06-29 DIAGNOSIS — D500 Iron deficiency anemia secondary to blood loss (chronic): Secondary | ICD-10-CM

## 2016-06-29 DIAGNOSIS — C787 Secondary malignant neoplasm of liver and intrahepatic bile duct: Secondary | ICD-10-CM

## 2016-06-29 DIAGNOSIS — C772 Secondary and unspecified malignant neoplasm of intra-abdominal lymph nodes: Secondary | ICD-10-CM

## 2016-06-29 DIAGNOSIS — D649 Anemia, unspecified: Secondary | ICD-10-CM

## 2016-06-29 DIAGNOSIS — E119 Type 2 diabetes mellitus without complications: Secondary | ICD-10-CM

## 2016-06-29 DIAGNOSIS — C189 Malignant neoplasm of colon, unspecified: Secondary | ICD-10-CM

## 2016-06-29 DIAGNOSIS — C779 Secondary and unspecified malignant neoplasm of lymph node, unspecified: Secondary | ICD-10-CM | POA: Diagnosis not present

## 2016-06-29 NOTE — Telephone Encounter (Signed)
Message sent to chemo scheduler to be added to schedule starting 07/15/16, per 06/29/16 los. Chemo education scheduled as well as labs. Appointments scheduled per 06/29/16 los. Copy of AVS report and appointment schedule was given to patient, per 06/29/16 los.

## 2016-06-29 NOTE — Progress Notes (Signed)
[name]    HEMATOLOGY/ONCOLOGY CLINIC NOTE  Date of Service: 06/29/2016  Patient Care Team: [name], MD as PCP - General (Family Medicine)  CHIEF COMPLAINTS/PURPOSE OF CONSULTATION:  Post hospitalization followup for colon cancer  HISTORY OF PRESENTING ILLNESS:   Scott Gallagher is a wonderful 45 y.o. male who has been referred to [place] by Dr .Scott Hausen, MD for evaluation and management of newly diagnosed colon cancer likely with metastases to the liver.  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 He noted some fatigue but no other acute symptoms or overt GI bleeding.  Patient required transfusion of 2 units of PRBCs and IV iron. He subsequently had a colonoscopy which showed a near obstructing mass of his ascending colon close to the cecum which appeared concerning for colon cancer.Biopsy showed atleast high grade adenomatous dysplasia. CT chest /abd/pelvis was done on 06/08/2016 and showed 3.7 cm in length area of narrowing of the ascending colon, which lies 2 cm above the ileocecal junction, initially felt to likely reflect an area of spasm, but consistent with a constricting lesion. There are 2 adjacent sub cm shotty mesenteric lymph nodes, the largest measuring 9 mm in short axis.12 mm low-density lesion in the anterior segment of the right lobe. Liver otherwise unremarkable.  Patient underwent laparoscopic assisted rt hemicolectomy on 06/17/2016 , liver was surveyed and an umbilicated nodule was noted on the rt lobe of liver and a biopsy of this was done that revealed adenocarcinoma on frozen section.  Was seen in the hospital and was referred to continue follow-up with Dr. Marin Olp but chooses to continue follow-up here since it is close to where he  lives and that it would be more convenient to him.   We discussed his pathology results and details. We discussed the need for a PET/CT scan towards the end of the month to have an accurate baseline staging and more specifically to evaluate residual disease in his liver and other sites since this will determine whether he has isolated oligo metastatic disease in the liver or if it is more widespread.  We discussed the rationale, attention adverse effects, advantages, alternatives to FOLFOX chemotherapy. We discussed the role of port placement. We discussed the specifics of chemotherapy and all his questions were answered in great details.   He is currently healing very well after surgery with incisions having close up nicely. Mild postoperative discomfort but good bowel movements and no other acute issues. No evidence of rectal bleeding.   MEDICAL HISTORY:  Past Medical History:  Diagnosis Date  . Diabetes mellitus without complication (HCC)    diet controlled  . Hyperlipidemia   . Sleep apnea    cpap  . Wears glasses     SURGICAL HISTORY: Past Surgical History:  Procedure Laterality Date  . LAPAROSCOPIC RIGHT HEMI COLECTOMY Right 06/17/2016   Procedure: LAPAROSCOPIC ASSISTED  RIGHT HEMI COLECTOMY;  Surgeon: Scott Hausen, MD;  Location: [facility];  Service: General;  Laterality: Right;  . SHOULDER ACROMIOPLASTY Right 12/20/2014   Procedure: SHOULDER ACROMIOPLASTY;  Surgeon: Melrose Nakayama, MD;  Location: [place];  Service: Orthopedics;  Laterality: Right;  . SHOULDER ARTHROSCOPY Right 12/20/2014   Procedure: RIGHT ARTHROSCOPY SHOULDER WITH DEBRIDEMENT;  Surgeon: Melrose Nakayama, MD;  Location: [place];  Service: Orthopedics;  Laterality: Right;  . TESTICLE SURGERY     as teen  . TONSILLECTOMY    . WISDOM TOOTH EXTRACTION      SOCIAL HISTORY: Social History   Social History  . Marital status: Married    Spouse name: N/A  . Number of children: N/A    . Years of education: N/A   Occupational History  . cemetery maintenance    Social History Main Topics  . Smoking status: Former Smoker    Quit date: 12/16/2012  . Smokeless tobacco: Never Used  . Alcohol use 1.8 oz/week    3 Shots of liquor per week     Comment: most days  . Drug use: No  . Sexual activity: Not on file   Other Topics Concern  . Not on file   Social History Narrative  . No narrative on file    FAMILY HISTORY: Family History  Problem Relation Age of Onset  . Cancer Other   . Diabetes Other   . Hyperlipidemia Other   . Hypertension Other     ALLERGIES:  is allergic to penicillins.  MEDICATIONS:  Current Outpatient Prescriptions  Medication Sig Dispense Refill  . atorvastatin (LIPITOR) 40 MG tablet Take 40 mg by mouth daily.    [name] oxyCODONE-acetaminophen (PERCOCET/ROXICET) 5-325 MG tablet Take 1-2 tablets by mouth every 4 (four) hours as needed for moderate pain. 30 tablet 0   No current facility-administered medications for this visit.     REVIEW OF SYSTEMS:    10 Point review of Systems was done is negative except as noted above.  PHYSICAL EXAMINATION: ECOG PERFORMANCE STATUS: 1 - Symptomatic but completely ambulatory  . Vitals:   06/29/16 1427  BP: 109/66  Pulse: 77  Resp: 18  Temp: 98.1 F (36.7 C)   Filed Weights   06/29/16 1427  Weight: 244 lb 6.4 oz (110.9 kg)   .Body mass index is 31.38 kg/m. GENERAL:alert, in no acute distress and comfortable SKIN: skin color, texture, turgor are normal, no rashes or significant lesions EYES: normal, conjunctiva are pink and non-injected, sclera clear OROPHARYNX:no exudate, no erythema and lips, buccal mucosa, and tongue normal  NECK: supple, no JVD, thyroid normal size, non-tender, without nodularity LYMPH:  no palpable lymphadenopathy in the cervical, axillary or inguinal LUNGS: clear to auscultation with normal respiratory effort HEART: regular rate & rhythm,  no murmurs and no lower  extremity edema ABDOMEN: abdomen soft, bowel sounds normoactive, healing laparoscopic surgical incisions with no discharge look clean. Musculoskeletal: no cyanosis of digits and no clubbing  PSYCH: alert & oriented x 3 with fluent speech NEURO: no focal motor/sensory deficits   LABORATORY DATA:  I have reviewed the data as listed  . CBC Latest Ref Rng & Units 06/19/2016 06/18/2016 06/17/2016  WBC 4.0 - 10.5 K/uL 9.7 13.3(H) 16.7(H)  Hemoglobin 13.0 - 17.0 g/dL 9.8(L) 10.0(L) 10.9(L)  Hematocrit 39.0 - 52.0 % 32.9(L) 33.2(L) 36.4(L)  Platelets 150 - 400 K/uL 402(H) 444(H) 446(H)    . CMP Latest Ref Rng & Units 06/18/2016 06/17/2016 05/29/2016  Glucose 65 - 99 mg/dL 120(H) - 93  BUN 6 - 20 mg/dL 13 - 15.4  Creatinine 0.61 - 1.24 mg/dL 0.97 1.29(H) 0.9  Sodium 135 - 145 mmol/L 136 - 140  Potassium 3.5 - 5.1 mmol/L 4.2 - 4.6  Chloride 101 - 111 mmol/L 103 - -  CO2 22 - 32 mmol/L 25 - 25  Calcium 8.9 - 10.3  mg/dL 9.0 - 9.2  Total Protein 6.4 - 8.3 g/dL - - 7.0  Total Bilirubin 0.20 - 1.20 mg/dL - - 0.47  Alkaline Phos 40 - 150 U/L - - 72  AST 5 - 34 U/L - - 22  ALT 0 - 55 U/L - - 23   . Lab Results  Component Value Date   IRON 30 (L) 06/20/2016   TIBC 385 06/20/2016   IRONPCTSAT 8 (L) 06/20/2016   (Iron and TIBC)  Lab Results  Component Value Date   FERRITIN 30 06/20/2016     Microscopic Comment 2. COLON AND RECTUM (INCLUDING TRANS-ANAL RESECTION): Specimen: Terminal ileum, right colon and appendix Procedure: Segmental resection Tumor site: Proximal ascending colon Specimen integrity: Intact Macroscopic intactness of mesorectum: Not applicable: x Complete: NA Near complete: NA Incomplete: NA Cannot be determined (specify): NA Macroscopic tumor perforation: The mass invades through muscularis propria into pericolonic soft tissue Invasive tumor: Maximum size: 5.5 cm Histologic type(s): Adenocarcinoma Histologic grade and differentiation: G3 G1: well  differentiated/low grade 1 of 4 Supplemental copy SUPPLEMENTAL for Lawler, Brogen ([id number]) Microscopic Comment(continued) G2: moderately differentiated/low grade G3: poorly differentiated/high grade G4: undifferentiated/high grade Type of polyp in which invasive carcinoma arose: Tubular adenoma Microscopic extension of invasive tumor: The mass invade through the muscularis propria into pericolonic soft tissue Lymph-Vascular invasion: Identified Peri-neural invasion: Identified Tumor deposit(s) (discontinuous extramural extension): Present Resection margins: Proximal margin: Negative Distal margin: Negative Circumferential (radial) (posterior ascending, posterior descending; lateral and posterior mid-rectum; and entire lower 1/3 rectum):Negative Mesenteric margin (sigmoid and transverse): NA Distance closest margin (if all above margins negative): 3.7 cm from the non peritonealized pericolic soft tissue margin Trans-anal resection margins only: Deep margin: NA Mucosal Margin: NA Distance closest mucosal margin (if negative): NA Treatment effect (neo-adjuvant therapy): NA Additional polyp(s): Negative Non-neoplastic findings: Unremarkable Lymph nodes: number examined 18; number positive: 5 Pathologic Staging: pT3, N2a, M1a Ancillary studies: MSI ordered   RADIOGRAPHIC STUDIES: I have personally reviewed the radiological images as listed and agreed with the findings in the report. US Abdomen Complete  Result Date: 06/03/2016 CLINICAL DATA:  Epigastric pain EXAM: ABDOMEN ULTRASOUND COMPLETE COMPARISON:  CT abdomen pelvis 05/23/2016 FINDINGS: Gallbladder: No gallstones or wall thickening visualized. No sonographic Murphy sign noted by sonographer. Common bile duct: Diameter: 2.8 mm Liver: No focal lesion identified. Within normal limits in parenchymal echogenicity. IVC: No abnormality visualized. Pancreas: Visualized portion unremarkable. Spleen: Size and appearance within normal  limits. Right Kidney: Length: 11.7 cm. 12 mm midpole cyst. Echogenicity within normal limits. No mass or hydronephrosis visualized. Left Kidney: Length: 12.9 cm. 17 mm midpole cyst. Echogenicity within normal limits. No mass or hydronephrosis visualized. Abdominal aorta: No aneurysm visualized. Other findings: None. IMPRESSION: Negative abdominal ultrasound. Electronically Signed   By: Franchot Gallo M.D.   On: 06/03/2016 11:34   CT CHEST, ABDOMEN, AND PELVIS WITH CONTRAST 05/23/2016  TECHNIQUE: Multidetector CT imaging of the chest, abdomen and pelvis was performed following the standard protocol during bolus administration of intravenous contrast.  CONTRAST:  140m ISOVUE-300 IOPAMIDOL (ISOVUE-300) INJECTION 61%  COMPARISON:  None.  FINDINGS: CT CHEST FINDINGS  Cardiovascular: Heart is normal in size and configuration. No coronary artery calcifications. Great vessels normal in caliber. No aortic atherosclerosis.  Mediastinum/Nodes: No enlarged mediastinal, hilar, or axillary lymph nodes. Thyroid gland, trachea, and esophagus demonstrate no significant findings.  Lungs/Pleura: Minor subsegmental atelectasis in the right lower lobe. Lungs otherwise clear. No pleural effusion or pneumothorax.  Musculoskeletal: No chest wall abnormality. No acute  or significant osseous findings.  CT ABDOMEN PELVIS FINDINGS  Hepatobiliary: 12 mm low-density lesion in the anterior segment of the right lobe. Liver otherwise unremarkable. Normal gallbladder. No bile duct dilation.  Pancreas: Unremarkable. No pancreatic ductal dilatation or surrounding inflammatory changes.  Spleen: Normal in size without focal abnormality.  Adrenals/Urinary Tract: No adrenal masses. Single low-density masses in each kidney measuring 13 mm, in the mid to lower pole on each side, consistent with cysts. No other renal masses, no stones and no hydronephrosis. Normal ureters. Normal  bladder.  Stomach/Bowel: Stomach is within normal limits. Appendix appears normal. No evidence of bowel wall thickening, distention, or inflammatory changes.  Vascular/Lymphatic: Minor inferior aorta atherosclerotic calcifications. No aneurysm. No adenopathy.  Reproductive: Prostate is unremarkable.  Other: No abdominal wall hernia or abnormality. No abdominopelvic ascites.  Musculoskeletal: Unremarkable.  IMPRESSION: 1. No acute findings within the chest, abdomen or pelvis. 2. No findings to account for chronic blood loss. 3. 12 mm low-density liver lesion. This is not as low in attenuation as would be expected for a cyst. The lesion is nonspecific. This could be further assessed with liver MRI with and without contrast. 4. Single renal cyst bilaterally. 5. Minor aortic atherosclerosis.  Electronically Signed: By: Lajean Manes M.D. 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 PLAN -Pathology findings were discussed in details with the patient and his wife. -They were both understandably upset and emotional support was provided. -We went over the NCCN treatment guideline for Oligometastatic colon cancer and goal to try to get to NED status with metastatectomy if possible and if limited liver and or lung metastases only. -Patient is healing well from his surgery (06/17/2016) and has having good bowel movements. -We shall recheck Dr. Hassell Done to help with Port-A-Cath placement since the patient will need FOLFOX chemotherapy - we shall get PET/CT scan about 4 weeks out from surgery for accurate pre-treatment staging to determine extent of the disease since that will have a bearing on treatment strategy and  determine if this is only oligo-metastatic disease where we might intend to pursue resection of limited liver/lung mets. -We shall plan to treat him with 2-3 months of FOLFOX chemotherapy . If PET scan shows oligo metastatic disease and the patient has good response to treatment with no additional disease after 2-3 months of FOLFOX chemotherapy would need to consider resection of any limited metastases to try to achieve NED status which might provide improved PFS and possibly OS - we would anticipate that after surgery he would need to complete additional FOLFOX chemotherapy (completed a total of 6 months of treatment atleast) -We'll need to get Foundation 1 testing on his tissue as well and MSI testing to understand other treatment options as necessary. -We'll get baseline CEA level  #2. 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 Lab Results  Component Value Date   IRON 30 (L) 06/20/2016   TIBC 385 06/20/2016   IRONPCTSAT 8 (L) 06/20/2016   (Iron and TIBC)  Lab Results  Component Value Date   FERRITIN 30 06/20/2016   Plan -Will monitor hemoglobin and consider additional IV iron as needed.   #3  Patient Active Problem List  Diagnosis Date Noted  . Metastatic colon cancer to liver ([place]) 07/01/2016  . Right Colon cancer metastasized to liver ([place]) 06/17/2016  . Chronic GI bleeding 05/29/2016  . Diabetes mellitus type 2, diet-controlled ([place]) 05/22/2016  . OSA (obstructive sleep apnea) 05/22/2016  . Hyperlipidemia 05/22/2016  . Anemia due to blood loss, chronic   . Anemia 05/21/2016   -Continue follow-up with primary care physician .Gara Kroner, MD for optimization of diabetes management . Might need basal insulin if his blood sugars are elevated with very chemotherapy steroids . -Continue use of CPAP for sleep apnea . -Consider discontinuation of statins while on chemotherapy .  Labs and  PET/CT on 07/13/2016 Chemotherapy counseling for FOLFOX chemotherapy Would plan to start FOLFOX on 07/15/2016 with clinic visit and labs   All of the patients questions were answered with apparent satisfaction. The patient knows to call the clinic with any problems, questions or concerns.  I spent 60 minutes counseling the patient face to face. The total time spent in the appointment was 60 minutes and more than 50% was on counseling and direct patient cares.    Sullivan Lone MD [place] AAHIVMS [facility] Hematology/Oncology Physician [facility]  (Office):       [phone] (Work cell):  [phone] (Fax):           [phone]  06/29/2016 2:35 PM

## 2016-06-30 ENCOUNTER — Telehealth: Payer: Self-pay | Admitting: *Deleted

## 2016-06-30 ENCOUNTER — Other Ambulatory Visit (HOSPITAL_COMMUNITY)
Admission: RE | Admit: 2016-06-30 | Discharge: 2016-06-30 | Disposition: A | Discharge status: Home / Self Care | Payer: 59 | Source: Ambulatory Visit | Attending: Hematology | Admitting: Hematology

## 2016-06-30 DIAGNOSIS — C189 Malignant neoplasm of colon, unspecified: Secondary | ICD-10-CM | POA: Diagnosis not present

## 2016-06-30 NOTE — Telephone Encounter (Signed)
Per LOS I have scheduled appt and notified the scheduler 

## 2016-07-01 DIAGNOSIS — C787 Secondary malignant neoplasm of liver and intrahepatic bile duct: Principal | ICD-10-CM | POA: Insufficient documentation

## 2016-07-01 DIAGNOSIS — C189 Malignant neoplasm of colon, unspecified: Secondary | ICD-10-CM | POA: Insufficient documentation

## 2016-07-01 MED ORDER — ONDANSETRON HCL 8 MG PO TABS: 8.0000 mg | ORAL_TABLET | Freq: Two times a day (BID) | ORAL | 1 refills | Status: DC | PRN

## 2016-07-01 MED ORDER — LIDOCAINE-PRILOCAINE 2.5-2.5 % EX CREA: TOPICAL_CREAM | CUTANEOUS | 3 refills | Status: DC

## 2016-07-01 MED ORDER — PROCHLORPERAZINE MALEATE 10 MG PO TABS: 10.0000 mg | ORAL_TABLET | Freq: Four times a day (QID) | ORAL | 1 refills | Status: DC | PRN

## 2016-07-01 MED ORDER — DEXAMETHASONE 4 MG PO TABS: 8.0000 mg | ORAL_TABLET | Freq: Every day | ORAL | 1 refills | Status: DC

## 2016-07-01 NOTE — Progress Notes (Signed)
Patient on plan of care prior to pathways. 

## 2016-07-01 NOTE — Progress Notes (Signed)
START ON PATHWAY REGIMEN - Colorectal  [id number]: Neoadjuvant [id number] (Number of Cycles to be Determined by Surgeon)   A cycle is every 14 days:     Oxaliplatin (Eloxatin(R)) 85 mg/m2 in 250 mL D5W IV over 2 hours day 1, q14 days Dose Mod: None     Leucovorin 400 mg/m2 in 250 mL D5W IV over 2 hours day 1, q14 days, followed immediately by Dose Mod: None     5-Fluorouracil 400 mg/m2 IV bolus over 2-4 minutes day 1, q14 days Dose Mod: None     5-Fluorouracil 2,400 mg/m2 in _____mL NS CIV as a 46 hour infusion starting on day 1, q14 days Dose Mod: None Additional Orders: **Note: order sheet contains two q14 day cycles**  **Always confirm dose/schedule in your pharmacy ordering system**    Patient Characteristics: Metastatic Colorectal, First Line, Resectable, KRAS Mutation Positive/Unknown, BRAF Wild-Type/Unknown AJCC T Stage: X AJCC N Stage: X AJCC Stage Grouping: IVA AJCC M Stage: X Current evidence of distant metastases? Yes BRAF Mutation Status: Awaiting Test Results KRAS/NRAS Mutation Status: Awaiting Test Results Line of therapy: First Line Would you be surprised if this patient died  in the next year? I would be surprised if this patient died in the next year  Intent of Therapy: Curative Intent, Discussed with Patient

## 2016-07-02 ENCOUNTER — Encounter: Payer: Self-pay | Admitting: *Deleted

## 2016-07-02 ENCOUNTER — Other Ambulatory Visit: Payer: 59

## 2016-07-03 ENCOUNTER — Ambulatory Visit: Payer: 59 | Admitting: Hematology & Oncology

## 2016-07-03 ENCOUNTER — Other Ambulatory Visit: Payer: 59

## 2016-07-07 ENCOUNTER — Encounter (HOSPITAL_COMMUNITY): Payer: Self-pay | Admitting: *Deleted

## 2016-07-07 ENCOUNTER — Ambulatory Visit: Payer: Self-pay | Admitting: Surgery

## 2016-07-07 NOTE — Progress Notes (Signed)
hgba1c 10/17 epic Chest ct 10/17 epic

## 2016-07-12 ENCOUNTER — Other Ambulatory Visit: Payer: Self-pay | Admitting: Hematology

## 2016-07-13 ENCOUNTER — Ambulatory Visit (HOSPITAL_COMMUNITY): Payer: 59

## 2016-07-13 ENCOUNTER — Encounter (HOSPITAL_COMMUNITY): Payer: Self-pay | Admitting: *Deleted

## 2016-07-13 ENCOUNTER — Ambulatory Visit (HOSPITAL_COMMUNITY)
Admission: RE | Admit: 2016-07-13 | Discharge: 2016-07-13 | Disposition: A | Discharge status: Home / Self Care | Payer: 59 | Source: Ambulatory Visit | Attending: Surgery | Admitting: Surgery

## 2016-07-13 ENCOUNTER — Ambulatory Visit (HOSPITAL_COMMUNITY): Payer: 59 | Admitting: Anesthesiology

## 2016-07-13 ENCOUNTER — Encounter (HOSPITAL_COMMUNITY)
Admission: RE | Disposition: A | Discharge status: Home / Self Care | Payer: Self-pay | Source: Ambulatory Visit | Attending: Surgery

## 2016-07-13 DIAGNOSIS — C787 Secondary malignant neoplasm of liver and intrahepatic bile duct: Secondary | ICD-10-CM | POA: Insufficient documentation

## 2016-07-13 DIAGNOSIS — Z79899 Other long term (current) drug therapy: Secondary | ICD-10-CM | POA: Insufficient documentation

## 2016-07-13 DIAGNOSIS — Z6831 Body mass index (BMI) 31.0-31.9, adult: Secondary | ICD-10-CM | POA: Insufficient documentation

## 2016-07-13 DIAGNOSIS — E785 Hyperlipidemia, unspecified: Secondary | ICD-10-CM | POA: Insufficient documentation

## 2016-07-13 DIAGNOSIS — Z95828 Presence of other vascular implants and grafts: Secondary | ICD-10-CM

## 2016-07-13 DIAGNOSIS — C182 Malignant neoplasm of ascending colon: Secondary | ICD-10-CM | POA: Diagnosis not present

## 2016-07-13 DIAGNOSIS — G4733 Obstructive sleep apnea (adult) (pediatric): Secondary | ICD-10-CM | POA: Insufficient documentation

## 2016-07-13 DIAGNOSIS — E119 Type 2 diabetes mellitus without complications: Secondary | ICD-10-CM | POA: Insufficient documentation

## 2016-07-13 HISTORY — DX: Unspecified osteoarthritis, unspecified site: M19.90

## 2016-07-13 HISTORY — DX: Malignant (primary) neoplasm, unspecified: C80.1

## 2016-07-13 HISTORY — DX: Personal history of other medical treatment: Z92.89

## 2016-07-13 HISTORY — DX: Anemia, unspecified: D64.9

## 2016-07-13 HISTORY — PX: PORTACATH PLACEMENT: SHX2246

## 2016-07-13 LAB — BASIC METABOLIC PANEL
Anion gap: 9 (ref 5–15)
BUN: 19 mg/dL (ref 6–20)
CALCIUM: 9.1 mg/dL (ref 8.9–10.3)
CHLORIDE: 108 mmol/L (ref 101–111)
CO2: 23 mmol/L (ref 22–32)
CREATININE: 0.88 mg/dL (ref 0.61–1.24)
GFR calc Af Amer: >60 mL/min (ref >60)
GFR calc non Af Amer: >60 mL/min (ref >60)
Glucose, Bld: 102 mg/dL — ABNORMAL HIGH (ref 65–99)
POTASSIUM: 4.2 mmol/L (ref 3.5–5.1)
Sodium: 140 mmol/L (ref 135–145)

## 2016-07-13 LAB — CBC
HCT: 37.5 % — ABNORMAL LOW (ref 39.0–52.0)
HEMOGLOBIN: 11.7 g/dL — AB (ref 13.0–17.0)
MCH: 24.5 pg — AB (ref 26.0–34.0)
MCHC: 31.2 g/dL (ref 30.0–36.0)
MCV: 78.5 fL (ref 78.0–100.0)
PLATELETS: 315 10*3/uL (ref 150–400)
RBC: 4.78 MIL/uL (ref 4.22–5.81)
RDW: 24.8 % — ABNORMAL HIGH (ref 11.5–15.5)
WBC: 6.2 10*3/uL (ref 4.0–10.5)

## 2016-07-13 LAB — GLUCOSE, CAPILLARY: Glucose-Capillary: 97 mg/dL (ref 65–99)

## 2016-07-13 SURGERY — INSERTION, TUNNELED CENTRAL VENOUS DEVICE, WITH PORT
Anesthesia: General | Site: Chest | Laterality: Left

## 2016-07-13 MED ORDER — PROPOFOL 10 MG/ML IV BOLUS
INTRAVENOUS | Status: DC | PRN
  Administered 2016-07-13: 200 mg via INTRAVENOUS

## 2016-07-13 MED ORDER — BUPIVACAINE HCL (PF) 0.5 % IJ SOLN
INTRAMUSCULAR | Status: AC
  Filled 2016-07-13: qty 30

## 2016-07-13 MED ORDER — LIDOCAINE HCL (CARDIAC) 20 MG/ML IV SOLN: INTRAVENOUS | Status: DC | PRN

## 2016-07-13 MED ORDER — PROMETHAZINE HCL 25 MG/ML IJ SOLN: 6.2500 mg | INTRAMUSCULAR | Status: DC | PRN

## 2016-07-13 MED ORDER — SODIUM CHLORIDE 0.9% FLUSH: 3.0000 mL | Freq: Two times a day (BID) | INTRAVENOUS | Status: DC

## 2016-07-13 MED ORDER — ACETAMINOPHEN 325 MG PO TABS: 650.0000 mg | ORAL_TABLET | ORAL | Status: DC | PRN

## 2016-07-13 MED ORDER — FENTANYL CITRATE (PF) 250 MCG/5ML IJ SOLN
INTRAMUSCULAR | Status: AC
  Filled 2016-07-13: qty 5

## 2016-07-13 MED ORDER — HYDROMORPHONE HCL 1 MG/ML IJ SOLN: 0.2500 mg | INTRAMUSCULAR | Status: DC | PRN

## 2016-07-13 MED ORDER — CHLORHEXIDINE GLUCONATE CLOTH 2 % EX PADS: 6.0000 | MEDICATED_PAD | Freq: Once | CUTANEOUS | Status: DC

## 2016-07-13 MED ORDER — ONDANSETRON HCL 4 MG/2ML IJ SOLN
INTRAMUSCULAR | Status: DC | PRN
  Administered 2016-07-13: 4 mg via INTRAVENOUS

## 2016-07-13 MED ORDER — 0.9 % SODIUM CHLORIDE (POUR BTL) OPTIME
TOPICAL | Status: DC | PRN
  Administered 2016-07-13: 1000 mL

## 2016-07-13 MED ORDER — MIDAZOLAM HCL 5 MG/5ML IJ SOLN
INTRAMUSCULAR | Status: DC | PRN
  Administered 2016-07-13: 2 mg via INTRAVENOUS

## 2016-07-13 MED ORDER — LIDOCAINE 2% (20 MG/ML) 5 ML SYRINGE
INTRAMUSCULAR | Status: AC
  Filled 2016-07-13: qty 5

## 2016-07-13 MED ORDER — LACTATED RINGERS IV SOLN
INTRAVENOUS | Status: DC
  Administered 2016-07-13: 13:00:00 via INTRAVENOUS

## 2016-07-13 MED ORDER — FENTANYL CITRATE (PF) 250 MCG/5ML IJ SOLN
INTRAMUSCULAR | Status: DC | PRN
  Administered 2016-07-13: 50 ug via INTRAVENOUS
  Administered 2016-07-13 (×2): 100 ug via INTRAVENOUS

## 2016-07-13 MED ORDER — PROPOFOL 10 MG/ML IV BOLUS
INTRAVENOUS | Status: AC
  Filled 2016-07-13: qty 20

## 2016-07-13 MED ORDER — ACETAMINOPHEN 500 MG PO TABS
1000.0000 mg | ORAL_TABLET | ORAL | Status: AC
  Administered 2016-07-13: 1000 mg via ORAL
  Filled 2016-07-13: qty 2

## 2016-07-13 MED ORDER — SODIUM CHLORIDE 0.9 % IV SOLN: 250.0000 mL | INTRAVENOUS | Status: DC | PRN

## 2016-07-13 MED ORDER — CEFAZOLIN SODIUM-DEXTROSE 2-4 GM/100ML-% IV SOLN
INTRAVENOUS | Status: AC
  Filled 2016-07-13: qty 100

## 2016-07-13 MED ORDER — OXYCODONE HCL 5 MG PO TABS: 5.0000 mg | ORAL_TABLET | ORAL | Status: DC | PRN

## 2016-07-13 MED ORDER — ACETAMINOPHEN 650 MG RE SUPP
650.0000 mg | RECTAL | Status: DC | PRN
Start: 2016-07-13 — End: 2016-07-13

## 2016-07-13 MED ORDER — SODIUM CHLORIDE 0.9 % IV SOLN
Freq: Once | INTRAVENOUS | Status: AC
  Administered 2016-07-13: 14:00:00
  Filled 2016-07-13: qty 1.2

## 2016-07-13 MED ORDER — HEPARIN SODIUM (PORCINE) 5000 UNIT/ML IJ SOLN
5000.0000 [IU] | Freq: Once | INTRAMUSCULAR | Status: AC
  Administered 2016-07-13: 5000 [IU] via SUBCUTANEOUS
  Filled 2016-07-13: qty 1

## 2016-07-13 MED ORDER — CEFAZOLIN SODIUM-DEXTROSE 2-4 GM/100ML-% IV SOLN
2.0000 g | Freq: Once | INTRAVENOUS | Status: AC
  Administered 2016-07-13: 2 g via INTRAVENOUS

## 2016-07-13 MED ORDER — MIDAZOLAM HCL 2 MG/2ML IJ SOLN
INTRAMUSCULAR | Status: AC
  Filled 2016-07-13: qty 2

## 2016-07-13 MED ORDER — ONDANSETRON HCL 4 MG/2ML IJ SOLN
INTRAMUSCULAR | Status: AC
  Filled 2016-07-13: qty 2

## 2016-07-13 MED ORDER — LIDOCAINE HCL (CARDIAC) 20 MG/ML IV SOLN
INTRAVENOUS | Status: DC | PRN
  Administered 2016-07-13: 50 mg via INTRAVENOUS

## 2016-07-13 MED ORDER — SODIUM CHLORIDE 0.9% FLUSH: 3.0000 mL | INTRAVENOUS | Status: DC | PRN

## 2016-07-13 MED ORDER — HEPARIN SOD (PORK) LOCK FLUSH 100 UNIT/ML IV SOLN
INTRAVENOUS | Status: AC
  Filled 2016-07-13: qty 5

## 2016-07-13 MED ORDER — METRONIDAZOLE 500 MG PO TABS
1000.0000 mg | ORAL_TABLET | ORAL | Status: DC
  Filled 2016-07-13: qty 2

## 2016-07-13 MED ORDER — NEOMYCIN SULFATE 500 MG PO TABS
1000.0000 mg | ORAL_TABLET | ORAL | Status: DC
  Filled 2016-07-13: qty 2

## 2016-07-13 SURGICAL SUPPLY — 38 items
BAG DECANTER FOR FLEXI CONT (MISCELLANEOUS) ×2 IMPLANT
BENZOIN TINCTURE PRP APPL 2/3 (GAUZE/BANDAGES/DRESSINGS) IMPLANT
BLADE HEX COATED 2.75 (ELECTRODE) ×2 IMPLANT
BLADE SURG 15 STRL LF DISP TIS (BLADE) ×1 IMPLANT
BLADE SURG 15 STRL SS (BLADE) ×1
COVER SURGICAL LIGHT HANDLE (MISCELLANEOUS) ×2 IMPLANT
DECANTER SPIKE VIAL GLASS SM (MISCELLANEOUS) ×2 IMPLANT
DERMABOND ADVANCED (GAUZE/BANDAGES/DRESSINGS) ×1
DERMABOND ADVANCED .7 DNX12 (GAUZE/BANDAGES/DRESSINGS) ×1 IMPLANT
DRAPE C-ARM 42X120 X-RAY (DRAPES) ×2 IMPLANT
DRAPE LAPAROTOMY T 98X78 PEDS (DRAPES) ×2 IMPLANT
ELECT PENCIL ROCKER SW 15FT (MISCELLANEOUS) ×2 IMPLANT
ELECT REM PT RETURN 9FT ADLT (ELECTROSURGICAL) ×2
ELECTRODE REM PT RTRN 9FT ADLT (ELECTROSURGICAL) ×1 IMPLANT
GAUZE SPONGE 2X2 8PLY STRL LF (GAUZE/BANDAGES/DRESSINGS) IMPLANT
GAUZE SPONGE 4X4 12PLY STRL (GAUZE/BANDAGES/DRESSINGS) ×2 IMPLANT
GAUZE SPONGE 4X4 16PLY XRAY LF (GAUZE/BANDAGES/DRESSINGS) ×2 IMPLANT
GLOVE BIOGEL M 8.0 STRL (GLOVE) ×2 IMPLANT
GLOVE BIOGEL PI IND STRL 7.0 (GLOVE) ×1 IMPLANT
GLOVE BIOGEL PI INDICATOR 7.0 (GLOVE) ×1
GOWN SPEC L4 XLG W/TWL (GOWN DISPOSABLE) ×2 IMPLANT
GOWN STRL REUS W/TWL XL LVL3 (GOWN DISPOSABLE) ×6 IMPLANT
KIT BASIN OR (CUSTOM PROCEDURE TRAY) ×2 IMPLANT
KIT PORT POWER 8FR ISP CVUE (Catheter) ×2 IMPLANT
NEEDLE HYPO 22GX1.5 SAFETY (NEEDLE) ×2 IMPLANT
NS IRRIG 1000ML POUR BTL (IV SOLUTION) ×2 IMPLANT
PACK BASIC VI WITH GOWN DISP (CUSTOM PROCEDURE TRAY) ×2 IMPLANT
SPONGE GAUZE 2X2 STER 10/PKG (GAUZE/BANDAGES/DRESSINGS)
STRIP CLOSURE SKIN 1/2X4 (GAUZE/BANDAGES/DRESSINGS) IMPLANT
SUT PROLENE 2 0 CT2 30 (SUTURE) IMPLANT
SUT PROLENE 2 0 SH DA (SUTURE) IMPLANT
SUT VIC AB 4-0 SH 18 (SUTURE) ×4 IMPLANT
SYR 10ML LL (SYRINGE) ×2 IMPLANT
SYR BULB IRRIGATION 50ML (SYRINGE) IMPLANT
SYR CONTROL 10ML LL (SYRINGE) ×2 IMPLANT
TAPE CLOTH SURG 4X10 WHT LF (GAUZE/BANDAGES/DRESSINGS) ×2 IMPLANT
TOWEL OR 17X26 10 PK STRL BLUE (TOWEL DISPOSABLE) ×2 IMPLANT
TOWEL OR NON WOVEN STRL DISP B (DISPOSABLE) ×2 IMPLANT

## 2016-07-13 NOTE — Discharge Instructions (Signed)
Implanted Port Insertion, Care After Refer to this sheet in the next few weeks. These instructions provide you with information on caring for yourself after your procedure. Your health care provider may also give you more specific instructions. Your treatment has been planned according to current medical practices, but problems sometimes occur. Call your health care provider if you have any problems or questions after your procedure. WHAT TO EXPECT AFTER THE PROCEDURE After your procedure, it is typical to have the following:   Discomfort at the port insertion site. Ice packs to the area will help.  Bruising on the skin over the port. This will subside in 3-4 days. HOME CARE INSTRUCTIONS  After your port is placed, you will get a manufacturer's information card. The card has information about your port. Keep this card with you at all times.   Know what kind of port you have. There are many types of ports available.   Wear a medical alert bracelet in case of an emergency. This can help alert health care workers that you have a port.   The port can stay in for as long as your health care provider believes it is necessary.   A home health care nurse may give medicines and take care of the port.   You or a family member can get special training and directions for giving medicine and taking care of the port at home.  SEEK MEDICAL CARE IF:   Your port does not flush or you are unable to get a blood return.   You have a fever or chills. SEEK IMMEDIATE MEDICAL CARE IF:  You have new fluid or pus coming from your incision.   You notice a bad smell coming from your incision site.   You have swelling, pain, or more redness at the incision or port site.   You have chest pain or shortness of breath. This information is not intended to replace advice given to you by your health care provider. Make sure you discuss any questions you have with your health care provider. Document  Released: 05/24/2013 Document Revised: 08/08/2013 Document Reviewed: 05/24/2013 Elsevier Interactive Patient Education  2017 [place] An implanted port is a type of central line that is placed under the skin. Central lines are used to provide IV access when treatment or nutrition needs to be given through a person's veins. Implanted ports are used for long-term IV access. An implanted port may be placed because:   You need IV medicine that would be irritating to the small veins in your hands or arms.   You need long-term IV medicines, such as antibiotics.   You need IV nutrition for a long period.   You need frequent blood draws for lab tests.   You need dialysis.  Implanted ports are usually placed in the chest area, but they can also be placed in the upper arm, the abdomen, or the leg. An implanted port has two main parts:   Reservoir. The reservoir is round and will appear as a small, raised area under your skin. The reservoir is the part where a needle is inserted to give medicines or draw blood.   Catheter. The catheter is a thin, flexible tube that extends from the reservoir. The catheter is placed into a large vein. Medicine that is inserted into the reservoir goes into the catheter and then into the vein.  HOW WILL I CARE FOR MY INCISION SITE? Do not get the  incision site wet. Bathe or shower as directed by your health care provider.  HOW IS MY PORT ACCESSED? Special steps must be taken to access the port:   Before the port is accessed, a numbing cream can be placed on the skin. This helps numb the skin over the port site.   Your health care provider uses a sterile technique to access the port.  Your health care provider must put on a mask and sterile gloves.  The skin over your port is cleaned carefully with an antiseptic and allowed to dry.  The port is gently pinched between sterile gloves, and a needle is inserted into the  port.  Only "non-coring" port needles should be used to access the port. Once the port is accessed, a blood return should be checked. This helps ensure that the port is in the vein and is not clogged.   If your port needs to remain accessed for a constant infusion, a clear (transparent) bandage will be placed over the needle site. The bandage and needle will need to be changed every week, or as directed by your health care provider.   Keep the bandage covering the needle clean and dry. Do not get it wet. Follow your health care provider's instructions on how to take a shower or bath while the port is accessed.   If your port does not need to stay accessed, no bandage is needed over the port.  WHAT IS FLUSHING? Flushing helps keep the port from getting clogged. Follow your health care provider's instructions on how and when to flush the port. Ports are usually flushed with saline solution or a medicine called heparin. The need for flushing will depend on how the port is used.   If the port is used for intermittent medicines or blood draws, the port will need to be flushed:   After medicines have been given.   After blood has been drawn.   As part of routine maintenance.   If a constant infusion is running, the port may not need to be flushed.  HOW LONG WILL MY PORT STAY IMPLANTED? The port can stay in for as long as your health care provider thinks it is needed. When it is time for the port to come out, surgery will be done to remove it. The procedure is similar to the one performed when the port was put in.  WHEN SHOULD I SEEK IMMEDIATE MEDICAL CARE? When you have an implanted port, you should seek immediate medical care if:   You notice a bad smell coming from the incision site.   You have swelling, redness, or drainage at the incision site.   You have more swelling or pain at the port site or the surrounding area.   You have a fever that is not controlled with  medicine. This information is not intended to replace advice given to you by your health care provider. Make sure you discuss any questions you have with your health care provider. Document Released: 08/03/2005 Document Revised: 05/24/2013 Document Reviewed: 04/10/2013 Elsevier Interactive Patient Education  2017 [place] Anesthesia, Adult, Care After These instructions provide you with information about caring for yourself after your procedure. Your health care provider may also give you more specific instructions. Your treatment has been planned according to current medical practices, but problems sometimes occur. Call your health care provider if you have any problems or questions after your procedure. What can I expect after the procedure? After the procedure,  it is common to have:  Vomiting.  A sore throat.  Mental slowness. It is common to feel:  Nauseous.  Cold or shivery.  Sleepy.  Tired.  Sore or achy, even in parts of your body where you did not have surgery. Follow these instructions at home: For at least 24 hours after the procedure:  Do not:  Participate in activities where you could fall or become injured.  Drive.  Use heavy machinery.  Drink alcohol.  Take sleeping pills or medicines that cause drowsiness.  Make important decisions or sign legal documents.  Take care of children on your own.  Rest. Eating and drinking  If you vomit, drink water, juice, or soup when you can drink without vomiting.  Drink enough fluid to keep your urine clear or pale yellow.  Make sure you have little or no nausea before eating solid foods.  Follow the diet recommended by your health care provider. General instructions  Have a responsible adult stay with you until you are awake and alert.  Return to your normal activities as told by your health care provider. Ask your health care provider what activities are safe for you.  Take  over-the-counter and prescription medicines only as told by your health care provider.  If you smoke, do not smoke without supervision.  Keep all follow-up visits as told by your health care provider. This is important. Contact a health care provider if:  You continue to have nausea or vomiting at home, and medicines are not helpful.  You cannot drink fluids or start eating again.  You cannot urinate after 8-12 hours.  You develop a skin rash.  You have fever.  You have increasing redness at the site of your procedure. Get help right away if:  You have difficulty breathing.  You have chest pain.  You have unexpected bleeding.  You feel that you are having a life-threatening or urgent problem. This information is not intended to replace advice given to you by your health care provider. Make sure you discuss any questions you have with your health care provider. Document Released: 11/09/2000 Document Revised: 01/06/2016 Document Reviewed: 07/18/2015 Elsevier Interactive Patient Education  2017 [employer].

## 2016-07-13 NOTE — Anesthesia Preprocedure Evaluation (Signed)
Anesthesia Evaluation  Patient identified by MRN, date of birth, ID band Patient awake    Reviewed: Allergy & Precautions, NPO status , Patient's Chart, lab work & pertinent test results  History of Anesthesia Complications Negative for: history of anesthetic complications  Airway Mallampati: III  TM Distance: >3 FB Neck ROM: Full    Dental no notable dental hx. (+) Teeth Intact, Dental Advisory Given   Pulmonary sleep apnea and Continuous Positive Airway Pressure Ventilation , former smoker,    Pulmonary exam normal        Cardiovascular negative cardio ROS Normal cardiovascular exam     Neuro/Psych negative neurological ROS  negative psych ROS   GI/Hepatic negative GI ROS, Neg liver ROS,   Endo/Other  diabetes, Well Controlled, Type 2Morbid obesity  Renal/GU negative Renal ROS     Musculoskeletal   Abdominal   Peds  Hematology negative hematology ROS (+)   Anesthesia Other Findings   Reproductive/Obstetrics                             Anesthesia Physical  Anesthesia Plan  ASA: II  Anesthesia Plan: General   Post-op Pain Management:    Induction: Intravenous  Airway Management Planned: LMA  Additional Equipment: None  Intra-op Plan:   Post-operative Plan: Extubation in OR  Informed Consent: I have reviewed the patients History and Physical, chart, labs and discussed the procedure including the risks, benefits and alternatives for the proposed anesthesia with the patient or authorized representative who has indicated his/her understanding and acceptance.   Dental advisory given  Plan Discussed with: CRNA, Anesthesiologist and Surgeon  Anesthesia Plan Comments:         Anesthesia Quick Evaluation

## 2016-07-13 NOTE — Anesthesia Procedure Notes (Signed)
Procedure Name: LMA Insertion Date/Time: 07/13/2016 1:14 PM Performed by: Chandra Batch A Pre-anesthesia Checklist: Patient identified, Timeout performed, Emergency Drugs available, Suction available and Patient being monitored Patient Re-evaluated:Patient Re-evaluated prior to inductionOxygen Delivery Method: Circle system utilized Preoxygenation: Pre-oxygenation with 100% oxygen Intubation Type: IV induction Ventilation: Mask ventilation without difficulty LMA: LMA with gastric port inserted LMA Size: 5.0 Number of attempts: 1 Placement Confirmation: positive ETCO2 and CO2 detector Dental Injury: Teeth and Oropharynx as per pre-operative assessment

## 2016-07-13 NOTE — Interval H&P Note (Signed)
History and Physical Interval Note:  07/13/2016 12:17 PM  Scott Gallagher  has presented today for surgery, with the diagnosis of COLON CANCER  The various methods of treatment have been discussed with the patient and family. After consideration of risks, benefits and other options for treatment, the patient has consented to  Procedure(s): INSERTION PORT-A-CATH (N/A) as a surgical intervention .  The patient's history has been reviewed, patient examined, no change in status, stable for surgery.  I have reviewed the patient's chart and labs.  Questions were answered to the patient's satisfaction.     [name]

## 2016-07-13 NOTE — Transfer of Care (Signed)
Immediate Anesthesia Transfer of Care Note  Patient: Scott Gallagher  Procedure(s) Performed: Procedure(s): INSERTION PORT-A-CATH left subclavian (Left)  Patient Location: PACU  Anesthesia Type:General  Level of Consciousness: awake, alert  and oriented  Airway & Oxygen Therapy: Patient Spontanous Breathing and Patient connected to face mask oxygen  Post-op Assessment: Report given to RN and Post -op Vital signs reviewed and stable  Post vital signs: Reviewed and stable  Last Vitals:  Vitals:   07/13/16 1030 07/13/16 1415  BP: 123/70 (!) 133/91  Pulse: 78 92  Resp: 18 13  Temp: 36.8 C 36.9 C    Last Pain:  Vitals:   07/13/16 1030  TempSrc: Oral         Complications: No apparent anesthesia complications

## 2016-07-13 NOTE — H&P (View-Only) (Signed)
Patient ID: Scott Gallagher, male   DOB: 1971/03/16, 45 y.o.   MRN: [id number] [facility] Surgery Progress Note:   4 Days Post-Op  Subjective: Mental status is clear.  Doing well Objective: Vital signs in last 24 hours: Temp:  [97.9 F (36.6 C)-98.3 F (36.8 C)] 97.9 F (36.6 C) (11/05 0505) Pulse Rate:  [71-88] 71 (11/05 0505) Resp:  [16-18] 17 (11/05 0505) BP: (128-136)/(61-80) 130/61 (11/05 0505) SpO2:  [99 %-100 %] 99 % (11/05 0505)  Intake/Output from previous day: 11/04 0701 - 11/05 0700 In: 2250 [P.O.:1440; I.V.:810] Out: 3550 [Urine:3550] Intake/Output this shift: Total I/O In: -  Out: 600 [Urine:600]  Physical Exam: Work of breathing is normal.  Incisions OK  Lab Results:  Results for orders placed or performed during the hospital encounter of 06/17/16 (from the past 48 hour(s))  Ferritin     Status: None   Collection Time: 06/20/16  5:17 AM  Result Value Ref Range   Ferritin 30 24 - 336 ng/mL    Comment: Performed at [facility]  Iron and TIBC     Status: Abnormal   Collection Time: 06/20/16  5:17 AM  Result Value Ref Range   Iron 30 (L) 45 - 182 ug/dL   TIBC 385 250 - 450 ug/dL   Saturation Ratios 8 (L) 17.9 - 39.5 %   UIBC 355 ug/dL    Comment: Performed at [facility]  Glucose, capillary     Status: None   Collection Time: 06/20/16 12:03 PM  Result Value Ref Range   Glucose-Capillary 89 65 - 99 mg/dL  Glucose, capillary     Status: Abnormal   Collection Time: 06/20/16  4:58 PM  Result Value Ref Range   Glucose-Capillary 113 (H) 65 - 99 mg/dL  Glucose, capillary     Status: None   Collection Time: 06/20/16  8:19 PM  Result Value Ref Range   Glucose-Capillary 92 65 - 99 mg/dL  Glucose, capillary     Status: None   Collection Time: 06/20/16 11:42 PM  Result Value Ref Range   Glucose-Capillary 98 65 - 99 mg/dL  Glucose, capillary     Status: Abnormal   Collection Time: 06/21/16  5:11 AM  Result Value Ref Range   Glucose-Capillary  103 (H) 65 - 99 mg/dL  Glucose, capillary     Status: Abnormal   Collection Time: 06/21/16  7:37 AM  Result Value Ref Range   Glucose-Capillary 103 (H) 65 - 99 mg/dL    Radiology/Results: No results found.  Anti-infectives: Anti-infectives    Start     Dose/Rate Route Frequency Ordered Stop   06/17/16 2100  cefoTEtan (CEFOTAN) 2 g in dextrose 5 % 50 mL IVPB     2 g 100 mL/hr over 30 Minutes Intravenous Every 12 hours 06/17/16 1338 06/17/16 2156   06/17/16 0620  cefoTEtan (CEFOTAN) 2 g in dextrose 5 % 50 mL IVPB     2 g 100 mL/hr over 30 Minutes Intravenous On call to O.R. 06/17/16 0620 06/17/16 0915      Assessment/Plan: Problem List: Patient Active Problem List   Diagnosis Date Noted  . 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 Chronic GI bleeding 05/29/2016  . Diabetes mellitus type 2, diet-controlled ([place]) 05/22/2016  . OSA (obstructive sleep apnea) 05/22/2016  . Hyperlipidemia 05/22/2016  . Anemia due to blood loss, chronic   . Anemia 05/21/2016    Will advance to regular diet.   4 Days Post-Op  LOS: 4 days   Matt B. Hassell Done, MD, [facility] [phone] beeper [phone]  06/21/2016 9:30 AM

## 2016-07-13 NOTE — Op Note (Signed)
Surgeon: Kaylyn Lim, MD, FACS  Asst:  none  Anes:  General by LMA  Procedure: Insertion of left subclavian portacath  Diagnosis: Stage IV cancer of the colon  Complications: None noted  EBL:   10 cc  Drains: none  Description of Procedure:  The patient was taken to OR 1 at [facility].  After anesthesia was administered and the patient was prepped a timeout was performed.  The patient was placed in [place].  The left subclavian vein was cannulated on the first pass and the wire inserted without resistance.  Position of the wire in the SVC was confirmed with the C arm.  A pocket for the portacath was created and the tubing was tunneled to the exit point of the wire.  Under fluoro the obturator and peel away were inserted on the wire and were withdrawn.  The tubing was inserted under fluoro to about 20 cm and had good nonpulsatile flow.  It was cut and secured to the port and the port was sewn in to the pocket with 2 sutures of 2-0 prolene.  The wounds were closed with 4-0 vicryl and Dermabond and the angled Charisse March was inserted and good return was present and the port was flushed with concentrated aqueous heparin.    The patient tolerated the procedure well and was taken to the PACU in stable condition.  CXR was ordered.     Matt B. Hassell Done, [place], [facility], [place]

## 2016-07-13 NOTE — Anesthesia Postprocedure Evaluation (Signed)
Anesthesia Post Note  Patient: Scott Gallagher  Procedure(s) Performed: Procedure(s) (LRB): INSERTION PORT-A-CATH left subclavian (Left)  Patient location during evaluation: PACU Anesthesia Type: General Level of consciousness: sedated Pain management: pain level controlled Vital Signs Assessment: post-procedure vital signs reviewed and stable Respiratory status: spontaneous breathing and respiratory function stable Cardiovascular status: stable Anesthetic complications: no    Last Vitals:  Vitals:   07/13/16 1445 07/13/16 1505  BP: 121/88 126/77  Pulse: 77 78  Resp: 15 18  Temp:  36.8 C    Last Pain:  Vitals:   07/13/16 1445  TempSrc:   PainSc: 4                  Christol Thetford DANIEL

## 2016-07-14 ENCOUNTER — Telehealth: Payer: Self-pay | Admitting: Hematology

## 2016-07-14 ENCOUNTER — Encounter: Payer: Self-pay | Admitting: Pharmacist

## 2016-07-14 ENCOUNTER — Encounter (HOSPITAL_COMMUNITY): Payer: Self-pay | Admitting: Surgery

## 2016-07-14 NOTE — Telephone Encounter (Signed)
Added lab/fu for 11/29 at 1:15 pm and 1:30 pm - tx remains 2:15 pm. Spoke with patient he is aware.   Desk nurse/GK aware of 12 pm pet scan on 11/29. Patient will come directly to [facility] after pet scan.  Per patient/wife on 11/27 patient has already had ched.

## 2016-07-15 ENCOUNTER — Ambulatory Visit (HOSPITAL_COMMUNITY)
Admission: RE | Admit: 2016-07-15 | Discharge: 2016-07-15 | Disposition: A | Discharge status: Home / Self Care | Payer: 59 | Source: Ambulatory Visit | Attending: Hematology | Admitting: Hematology

## 2016-07-15 ENCOUNTER — Ambulatory Visit (HOSPITAL_BASED_OUTPATIENT_CLINIC_OR_DEPARTMENT_OTHER): Payer: 59 | Admitting: Hematology

## 2016-07-15 ENCOUNTER — Encounter: Payer: Self-pay | Admitting: Hematology

## 2016-07-15 ENCOUNTER — Encounter: Payer: Self-pay | Admitting: *Deleted

## 2016-07-15 ENCOUNTER — Other Ambulatory Visit (HOSPITAL_BASED_OUTPATIENT_CLINIC_OR_DEPARTMENT_OTHER): Payer: 59

## 2016-07-15 ENCOUNTER — Ambulatory Visit (HOSPITAL_BASED_OUTPATIENT_CLINIC_OR_DEPARTMENT_OTHER): Payer: 59

## 2016-07-15 VITALS — BP 130/71 | HR 89 | Temp 98.4°F | Resp 18 | Wt 246.4 lb

## 2016-07-15 DIAGNOSIS — C772 Secondary and unspecified malignant neoplasm of intra-abdominal lymph nodes: Secondary | ICD-10-CM

## 2016-07-15 DIAGNOSIS — C189 Malignant neoplasm of colon, unspecified: Secondary | ICD-10-CM

## 2016-07-15 DIAGNOSIS — C787 Secondary malignant neoplasm of liver and intrahepatic bile duct: Secondary | ICD-10-CM

## 2016-07-15 DIAGNOSIS — C182 Malignant neoplasm of ascending colon: Secondary | ICD-10-CM

## 2016-07-15 DIAGNOSIS — D500 Iron deficiency anemia secondary to blood loss (chronic): Secondary | ICD-10-CM | POA: Diagnosis not present

## 2016-07-15 DIAGNOSIS — Z5111 Encounter for antineoplastic chemotherapy: Secondary | ICD-10-CM | POA: Diagnosis not present

## 2016-07-15 DIAGNOSIS — K922 Gastrointestinal hemorrhage, unspecified: Secondary | ICD-10-CM | POA: Diagnosis not present

## 2016-07-15 DIAGNOSIS — D649 Anemia, unspecified: Secondary | ICD-10-CM

## 2016-07-15 DIAGNOSIS — R935 Abnormal findings on diagnostic imaging of other abdominal regions, including retroperitoneum: Secondary | ICD-10-CM | POA: Diagnosis not present

## 2016-07-15 LAB — CBC & DIFF AND RETIC
BASO%: 1 % (ref 0.0–2.0)
BASOS ABS: 0.1 10*3/uL (ref 0.0–0.1)
EOS ABS: 0.4 10*3/uL (ref 0.0–0.5)
EOS%: 5.8 % (ref 0.0–7.0)
HEMATOCRIT: 38.8 % (ref 38.4–49.9)
HEMOGLOBIN: 12.1 g/dL — AB (ref 13.0–17.1)
IMMATURE RETIC FRACT: 10.5 % (ref 3.00–10.60)
LYMPH#: 1.7 10*3/uL (ref 0.9–3.3)
LYMPH%: 23.7 % (ref 14.0–49.0)
MCH: 24.3 pg — ABNORMAL LOW (ref 27.2–33.4)
MCHC: 31.2 g/dL — AB (ref 32.0–36.0)
MCV: 78.1 fL — ABNORMAL LOW (ref 79.3–98.0)
MONO#: 0.8 10*3/uL (ref 0.1–0.9)
MONO%: 11.4 % (ref 0.0–14.0)
NEUT#: 4.1 10*3/uL (ref 1.5–6.5)
NEUT%: 58.1 % (ref 39.0–75.0)
Platelets: 281 10*3/uL (ref 140–400)
RBC: 4.97 10*6/uL (ref 4.20–5.82)
RDW: 28.2 % — AB (ref 11.0–14.6)
RETIC %: 1.11 % (ref 0.80–1.80)
RETIC CT ABS: 55.17 10*3/uL (ref 34.80–93.90)
WBC: 7.1 10*3/uL (ref 4.0–10.3)

## 2016-07-15 LAB — GLUCOSE, CAPILLARY: Glucose-Capillary: 86 mg/dL (ref 65–99)

## 2016-07-15 LAB — COMPREHENSIVE METABOLIC PANEL
ALT: 42 U/L (ref 0–55)
AST: 24 U/L (ref 5–34)
Albumin: 3.9 g/dL (ref 3.5–5.0)
Alkaline Phosphatase: 98 U/L (ref 40–150)
Anion Gap: 8 mEq/L (ref 3–11)
BUN: 13.5 mg/dL (ref 7.0–26.0)
CALCIUM: 9.4 mg/dL (ref 8.4–10.4)
CHLORIDE: 107 meq/L (ref 98–109)
CO2: 27 meq/L (ref 22–29)
CREATININE: 0.9 mg/dL (ref 0.7–1.3)
EGFR: >90 mL/min/{1.73_m2} (ref >90)
Glucose: 86 mg/dl (ref 70–140)
Potassium: 4.4 mEq/L (ref 3.5–5.1)
Sodium: 142 mEq/L (ref 136–145)
TOTAL PROTEIN: 7.3 g/dL (ref 6.4–8.3)
Total Bilirubin: 0.4 mg/dL (ref 0.20–1.20)

## 2016-07-15 LAB — IRON AND TIBC
%SAT: 7 % — AB (ref 20–55)
Iron: 30 ug/dL — ABNORMAL LOW (ref 42–163)
TIBC: 415 ug/dL — ABNORMAL HIGH (ref 202–409)
UIBC: 385 ug/dL — ABNORMAL HIGH (ref 117–376)

## 2016-07-15 LAB — CEA (IN HOUSE-CHCC): CEA (CHCC-IN HOUSE): 6.67 ng/mL — AB (ref 0.00–5.00)

## 2016-07-15 LAB — FERRITIN: FERRITIN: 13 ng/mL — AB (ref 22–316)

## 2016-07-15 MED ORDER — DEXAMETHASONE SODIUM PHOSPHATE 10 MG/ML IJ SOLN
INTRAMUSCULAR | Status: AC
  Filled 2016-07-15: qty 1

## 2016-07-15 MED ORDER — PALONOSETRON HCL INJECTION 0.25 MG/5ML
0.2500 mg | Freq: Once | INTRAVENOUS | Status: AC
  Administered 2016-07-15: 0.25 mg via INTRAVENOUS

## 2016-07-15 MED ORDER — PALONOSETRON HCL INJECTION 0.25 MG/5ML
INTRAVENOUS | Status: AC
Start: 2016-07-15 — End: 2016-07-15
  Filled 2016-07-15: qty 5

## 2016-07-15 MED ORDER — FLUDEOXYGLUCOSE F - 18 (FDG) INJECTION
12.1400 | Freq: Once | INTRAVENOUS | Status: AC | PRN
  Administered 2016-07-15: 12.14 via INTRAVENOUS

## 2016-07-15 MED ORDER — DEXAMETHASONE SODIUM PHOSPHATE 10 MG/ML IJ SOLN
10.0000 mg | Freq: Once | INTRAMUSCULAR | Status: AC
  Administered 2016-07-15: 10 mg via INTRAVENOUS

## 2016-07-15 MED ORDER — OXALIPLATIN CHEMO INJECTION 100 MG/20ML
84.0000 mg/m2 | Freq: Once | INTRAVENOUS | Status: AC
  Administered 2016-07-15: 200 mg via INTRAVENOUS
  Filled 2016-07-15: qty 40

## 2016-07-15 MED ORDER — LEUCOVORIN CALCIUM INJECTION 350 MG
400.0000 mg/m2 | Freq: Once | INTRAVENOUS | Status: AC
  Administered 2016-07-15: 964 mg via INTRAVENOUS
  Filled 2016-07-15: qty 48.2

## 2016-07-15 MED ORDER — SODIUM CHLORIDE 0.9 % IV SOLN
2400.0000 mg/m2 | INTRAVENOUS | Status: DC
  Administered 2016-07-15: 5800 mg via INTRAVENOUS
  Filled 2016-07-15: qty 116

## 2016-07-15 MED ORDER — DEXTROSE 5 % IV SOLN
Freq: Once | INTRAVENOUS | Status: AC
  Administered 2016-07-15: 15:00:00 via INTRAVENOUS

## 2016-07-15 MED ORDER — FLUOROURACIL CHEMO INJECTION 2.5 GM/50ML
400.0000 mg/m2 | Freq: Once | INTRAVENOUS | Status: AC
  Administered 2016-07-15: 950 mg via INTRAVENOUS
  Filled 2016-07-15: qty 19

## 2016-07-15 NOTE — Patient Instructions (Signed)
[facility] Discharge Instructions for Patients Receiving Chemotherapy  Today you received the following chemotherapy agents: Oxaliplatin, Leucovorin, Fluorouracil   To help prevent nausea and vomiting after your treatment, we encourage you to take your nausea medication as prescribed.    If you develop nausea and vomiting that is not controlled by your nausea medication, call the clinic.   BELOW ARE SYMPTOMS THAT SHOULD BE REPORTED IMMEDIATELY:  *FEVER GREATER THAN 100.5 F  *CHILLS WITH OR WITHOUT FEVER  NAUSEA AND VOMITING THAT IS NOT CONTROLLED WITH YOUR NAUSEA MEDICATION  *UNUSUAL SHORTNESS OF BREATH  *UNUSUAL BRUISING OR BLEEDING  TENDERNESS IN MOUTH AND THROAT WITH OR WITHOUT PRESENCE OF ULCERS  *URINARY PROBLEMS  *BOWEL PROBLEMS  UNUSUAL RASH Items with * indicate a potential emergency and should be followed up as soon as possible.  Feel free to call the clinic you have any questions or concerns. The clinic phone number is [phone].  Please show the CHEMO ALERT CARD at check-in to the Emergency Department and triage nurse.  Oxaliplatin Injection What is this medicine? OXALIPLATIN (ox AL i PLA tin) is a chemotherapy drug. It targets fast dividing cells, like cancer cells, and causes these cells to die. This medicine is used to treat cancers of the colon and rectum, and many other cancers. This medicine may be used for other purposes; ask your health care provider or pharmacist if you have questions. COMMON BRAND NAME(S): Eloxatin What should I tell my health care provider before I take this medicine? They need to know if you have any of these conditions: -kidney disease -an unusual or allergic reaction to oxaliplatin, other chemotherapy, other medicines, foods, dyes, or preservatives -pregnant or trying to get pregnant -breast-feeding How should I use this medicine? This drug is given as an infusion into a vein. It is administered in a hospital  or clinic by a specially trained health care professional. Talk to your pediatrician regarding the use of this medicine in children. Special care may be needed. Overdosage: If you think you have taken too much of this medicine contact a poison control center or emergency room at once. NOTE: This medicine is only for you. Do not share this medicine with others. What if I miss a dose? It is important not to miss a dose. Call your doctor or health care professional if you are unable to keep an appointment. What may interact with this medicine? -medicines to increase blood counts like filgrastim, pegfilgrastim, sargramostim -probenecid -some antibiotics like amikacin, gentamicin, neomycin, polymyxin B, streptomycin, tobramycin -zalcitabine Talk to your doctor or health care professional before taking any of these medicines: -acetaminophen -aspirin -ibuprofen -ketoprofen -naproxen This list may not describe all possible interactions. Give your health care provider a list of all the medicines, herbs, non-prescription drugs, or dietary supplements you use. Also tell them if you smoke, drink alcohol, or use illegal drugs. Some items may interact with your medicine. What should I watch for while using this medicine? Your condition will be monitored carefully while you are receiving this medicine. You will need important blood work done while you are taking this medicine. This medicine can make you more sensitive to cold. Do not drink cold drinks or use ice. Cover exposed skin before coming in contact with cold temperatures or cold objects. When out in cold weather wear warm clothing and cover your mouth and nose to warm the air that goes into your lungs. Tell your doctor if you get sensitive to the cold.   This drug may make you feel generally unwell. This is not uncommon, as chemotherapy can affect healthy cells as well as cancer cells. Report any side effects. Continue your course of treatment even  though you feel ill unless your doctor tells you to stop. In some cases, you may be given additional medicines to help with side effects. Follow all directions for their use. Call your doctor or health care professional for advice if you get a fever, chills or sore throat, or other symptoms of a cold or flu. Do not treat yourself. This drug decreases your body's ability to fight infections. Try to avoid being around people who are sick. This medicine may increase your risk to bruise or bleed. Call your doctor or health care professional if you notice any unusual bleeding. Be careful brushing and flossing your teeth or using a toothpick because you may get an infection or bleed more easily. If you have any dental work done, tell your dentist you are receiving this medicine. Avoid taking products that contain aspirin, acetaminophen, ibuprofen, naproxen, or ketoprofen unless instructed by your doctor. These medicines may hide a fever. Do not become pregnant while taking this medicine. Women should inform their doctor if they wish to become pregnant or think they might be pregnant. There is a potential for serious side effects to an unborn child. Talk to your health care professional or pharmacist for more information. Do not breast-feed an infant while taking this medicine. Call your doctor or health care professional if you get diarrhea. Do not treat yourself. What side effects may I notice from receiving this medicine? Side effects that you should report to your doctor or health care professional as soon as possible: -allergic reactions like skin rash, itching or hives, swelling of the face, lips, or tongue -low blood counts - This drug may decrease the number of white blood cells, red blood cells and platelets. You may be at increased risk for infections and bleeding. -signs of infection - fever or chills, cough, sore throat, pain or difficulty passing urine -signs of decreased platelets or bleeding -  bruising, pinpoint red spots on the skin, black, tarry stools, nosebleeds -signs of decreased red blood cells - unusually weak or tired, fainting spells, lightheadedness -breathing problems -chest pain, pressure -cough -diarrhea -jaw tightness -mouth sores -nausea and vomiting -pain, swelling, redness or irritation at the injection site -pain, tingling, numbness in the hands or feet -problems with balance, talking, walking -redness, blistering, peeling or loosening of the skin, including inside the mouth -trouble passing urine or change in the amount of urine Side effects that usually do not require medical attention (report to your doctor or health care professional if they continue or are bothersome): -changes in vision -constipation -hair loss -loss of appetite -metallic taste in the mouth or changes in taste -stomach pain This list may not describe all possible side effects. Call your doctor for medical advice about side effects. You may report side effects to FDA at 1-800-FDA-1088. Where should I keep my medicine? This drug is given in a hospital or clinic and will not be stored at home. NOTE: This sheet is a summary. It may not cover all possible information. If you have questions about this medicine, talk to your doctor, pharmacist, or health care provider.  2017 Elsevier/Gold Standard (2008-02-28 17:22:47) Leucovorin injection What is this medicine? LEUCOVORIN (loo koe VOR in) is used to prevent or treat the harmful effects of some medicines. This medicine is used to treat anemia   caused by a low amount of folic acid in the body. It is also used with 5-fluorouracil (5-FU) to treat colon cancer. This medicine may be used for other purposes; ask your health care provider or pharmacist if you have questions. What should I tell my health care provider before I take this medicine? They need to know if you have any of these conditions: -anemia from low levels of vitamin B-12 in the  blood -an unusual or allergic reaction to leucovorin, folic acid, other medicines, foods, dyes, or preservatives -pregnant or trying to get pregnant -breast-feeding How should I use this medicine? This medicine is for injection into a muscle or into a vein. It is given by a health care professional in a hospital or clinic setting. Talk to your pediatrician regarding the use of this medicine in children. Special care may be needed. Overdosage: If you think you have taken too much of this medicine contact a poison control center or emergency room at once. NOTE: This medicine is only for you. Do not share this medicine with others. What if I miss a dose? This does not apply. What may interact with this medicine? -capecitabine -fluorouracil -phenobarbital -phenytoin -primidone -trimethoprim-sulfamethoxazole This list may not describe all possible interactions. Give your health care provider a list of all the medicines, herbs, non-prescription drugs, or dietary supplements you use. Also tell them if you smoke, drink alcohol, or use illegal drugs. Some items may interact with your medicine. What should I watch for while using this medicine? Your condition will be monitored carefully while you are receiving this medicine. This medicine may increase the side effects of 5-fluorouracil, 5-FU. Tell your doctor or health care professional if you have diarrhea or mouth sores that do not get better or that get worse. What side effects may I notice from receiving this medicine? Side effects that you should report to your doctor or health care professional as soon as possible: -allergic reactions like skin rash, itching or hives, swelling of the face, lips, or tongue -breathing problems -fever, infection -mouth sores -unusual bleeding or bruising -unusually weak or tired Side effects that usually do not require medical attention (report to your doctor or health care professional if they continue or are  bothersome): -constipation or diarrhea -loss of appetite -nausea, vomiting This list may not describe all possible side effects. Call your doctor for medical advice about side effects. You may report side effects to FDA at 1-800-FDA-1088. Where should I keep my medicine? This drug is given in a hospital or clinic and will not be stored at home. NOTE: This sheet is a summary. It may not cover all possible information. If you have questions about this medicine, talk to your doctor, pharmacist, or health care provider.  2017 Elsevier/Gold Standard (2008-02-07 16:50:29) Fluorouracil, 5-FU injection What is this medicine? FLUOROURACIL, 5-FU (flure oh YOOR a sil) is a chemotherapy drug. It slows the growth of cancer cells. This medicine is used to treat many types of cancer like breast cancer, colon or rectal cancer, pancreatic cancer, and stomach cancer. This medicine may be used for other purposes; ask your health care provider or pharmacist if you have questions. COMMON BRAND NAME(S): Adrucil What should I tell my health care provider before I take this medicine? They need to know if you have any of these conditions: -blood disorders -dihydropyrimidine dehydrogenase (DPD) deficiency -infection (especially a virus infection such as chickenpox, cold sores, or herpes) -kidney disease -liver disease -malnourished, poor nutrition -recent or ongoing radiation   ongoing radiation therapy -an unusual or allergic reaction to fluorouracil, other chemotherapy, other medicines, foods, dyes, or preservatives -pregnant or trying to get pregnant -breast-feeding How should I use this medicine? This drug is given as an infusion or injection into a vein. It is administered in a hospital or clinic by a specially trained health care professional. Talk to your pediatrician regarding the use of this medicine in children. Special care may be needed. Overdosage: If you think you have taken too much of this medicine contact a  poison control center or emergency room at once. NOTE: This medicine is only for you. Do not share this medicine with others. What if I miss a dose? It is important not to miss your dose. Call your doctor or health care professional if you are unable to keep an appointment. What may interact with this medicine? -allopurinol -cimetidine -dapsone -digoxin -hydroxyurea -leucovorin -levamisole -medicines for seizures like ethotoin, fosphenytoin, phenytoin -medicines to increase blood counts like filgrastim, pegfilgrastim, sargramostim -medicines that treat or prevent blood clots like warfarin, enoxaparin, and dalteparin -methotrexate -metronidazole -pyrimethamine -some other chemotherapy drugs like busulfan, cisplatin, estramustine, vinblastine -trimethoprim -trimetrexate -vaccines Talk to your doctor or health care professional before taking any of these medicines: -acetaminophen -aspirin -ibuprofen -ketoprofen -naproxen This list may not describe all possible interactions. Give your health care provider a list of all the medicines, herbs, non-prescription drugs, or dietary supplements you use. Also tell them if you smoke, drink alcohol, or use illegal drugs. Some items may interact with your medicine. What should I watch for while using this medicine? Visit your doctor for checks on your progress. This drug may make you feel generally unwell. This is not uncommon, as chemotherapy can affect healthy cells as well as cancer cells. Report any side effects. Continue your course of treatment even though you feel ill unless your doctor tells you to stop. In some cases, you may be given additional medicines to help with side effects. Follow all directions for their use. Call your doctor or health care professional for advice if you get a fever, chills or sore throat, or other symptoms of a cold or flu. Do not treat yourself. This drug decreases your body's ability to fight infections. Try to  avoid being around people who are sick. This medicine may increase your risk to bruise or bleed. Call your doctor or health care professional if you notice any unusual bleeding. Be careful brushing and flossing your teeth or using a toothpick because you may get an infection or bleed more easily. If you have any dental work done, tell your dentist you are receiving this medicine. Avoid taking products that contain aspirin, acetaminophen, ibuprofen, naproxen, or ketoprofen unless instructed by your doctor. These medicines may hide a fever. Do not become pregnant while taking this medicine. Women should inform their doctor if they wish to become pregnant or think they might be pregnant. There is a potential for serious side effects to an unborn child. Talk to your health care professional or pharmacist for more information. Do not breast-feed an infant while taking this medicine. Men should inform their doctor if they wish to father a child. This medicine may lower sperm counts. Do not treat diarrhea with over the counter products. Contact your doctor if you have diarrhea that lasts more than 2 days or if it is severe and watery. This medicine can make you more sensitive to the sun. Keep out of the sun. If you cannot avoid being in the sun,   clothing and use sunscreen. Do not use sun lamps or tanning beds/booths. What side effects may I notice from receiving this medicine? Side effects that you should report to your doctor or health care professional as soon as possible: -allergic reactions like skin rash, itching or hives, swelling of the face, lips, or tongue -low blood counts - this medicine may decrease the number of white blood cells, red blood cells and platelets. You may be at increased risk for infections and bleeding. -signs of infection - fever or chills, cough, sore throat, pain or difficulty passing urine -signs of decreased platelets or bleeding - bruising, pinpoint red spots on the  skin, black, tarry stools, blood in the urine -signs of decreased red blood cells - unusually weak or tired, fainting spells, lightheadedness -breathing problems -changes in vision -chest pain -mouth sores -nausea and vomiting -pain, swelling, redness at site where injected -pain, tingling, numbness in the hands or feet -redness, swelling, or sores on hands or feet -stomach pain -unusual bleeding Side effects that usually do not require medical attention (report to your doctor or health care professional if they continue or are bothersome): -changes in finger or toe nails -diarrhea -dry or itchy skin -hair loss -headache -loss of appetite -sensitivity of eyes to the light -stomach upset -unusually teary eyes This list may not describe all possible side effects. Call your doctor for medical advice about side effects. You may report side effects to FDA at 1-800-FDA-1088. Where should I keep my medicine? This drug is given in a hospital or clinic and will not be stored at home. NOTE: This sheet is a summary. It may not cover all possible information. If you have questions about this medicine, talk to your doctor, pharmacist, or health care provider.  2017 Elsevier/Gold Standard (2007-12-07 13:53:16)  

## 2016-07-16 ENCOUNTER — Telehealth: Payer: Self-pay | Admitting: *Deleted

## 2016-07-16 NOTE — Telephone Encounter (Signed)
Oncology Nurse Navigator Documentation  Oncology Nurse Navigator Flowsheets 07/16/2016  Navigator Location [facility]  Navigator Encounter Type Telephone  Telephone Outgoing Call;Appt Confirmation/Clarification  Barriers/Navigation Needs Coordination of Care--MRI 1-2 weeks  Interventions Coordination of Care--scheduled  Coordination of Care Radiology--MRI scheduled for 12/11 at 0845/0900 with NPO 4 hours prior. Instructed him to enter at [facility] entrance. Note to managed care to precert   Acuity Level 2  Time Spent with Patient 15

## 2016-07-16 NOTE — Progress Notes (Signed)
Oncology Nurse Navigator Documentation  Oncology Nurse Navigator Flowsheets 07/15/2016  Navigator Location [facility]  Navigator Encounter Type Initial MedOnc;Treatment  Telephone -  Treatment Initiated Date 07/16/2016  Patient Visit Type MedOnc  Treatment Phase First Chemo Tx--FILFOX  Barriers/Navigation Needs Education;Coordination of Care--will need MRI in next 1-2 weeks  Education  Symptom Management--nausea, cold sensitivity reviewed  Interventions Coordination of Care--navigator will schedule MRI tomorrow;Education  Coordination of Care -  Education Method Teach-back  Support Groups/Services GI Support Group  Acuity Level 2  Time Spent with Patient 15  Met with patient and his wife during initial chemotherapy treatment to provide support and assess for needs to promote continuity of care.   , RN, BSN GI Oncology Navigator [facility] 

## 2016-07-17 ENCOUNTER — Ambulatory Visit (HOSPITAL_BASED_OUTPATIENT_CLINIC_OR_DEPARTMENT_OTHER): Payer: 59

## 2016-07-17 ENCOUNTER — Telehealth: Payer: Self-pay | Admitting: *Deleted

## 2016-07-17 ENCOUNTER — Other Ambulatory Visit: Payer: Self-pay | Admitting: Hematology

## 2016-07-17 VITALS — BP 126/71 | HR 89 | Temp 97.7°F | Resp 18

## 2016-07-17 DIAGNOSIS — C787 Secondary malignant neoplasm of liver and intrahepatic bile duct: Secondary | ICD-10-CM

## 2016-07-17 DIAGNOSIS — C189 Malignant neoplasm of colon, unspecified: Secondary | ICD-10-CM

## 2016-07-17 MED ORDER — HEPARIN SOD (PORK) LOCK FLUSH 100 UNIT/ML IV SOLN
500.0000 [IU] | Freq: Once | INTRAVENOUS | Status: AC | PRN
  Administered 2016-07-17: 500 [IU]
  Filled 2016-07-17: qty 5

## 2016-07-17 MED ORDER — SODIUM CHLORIDE 0.9% FLUSH
10.0000 mL | INTRAVENOUS | Status: DC | PRN
  Administered 2016-07-17: 10 mL
  Filled 2016-07-17: qty 10

## 2016-07-17 NOTE — Telephone Encounter (Signed)
-----   Message from Brunetta Genera, MD sent at 07/17/2016 11:11 AM EST ----- Regarding: RE: Prior Authorization Note is in. Darden Dates --Could you let the patient know that we would like to hold his Lipitor given his liver metastases. -Also recommend he follow up with his primary care physician Dr. Christoper Fabian for close diabetes management since the steroids with chemotherapy will likely lead to him needing some basal insulin or sliding scale. -Also we would need to refer him to genetic counseling in the next month or so. -Please follow-up with pathology regarding his [facility] one results.  Thanks, [place]  ----- Message ----- From: Tania Ade, RN Sent: 07/16/2016   9:00 AM To: Gaspar Bidding, Brunetta Genera, MD Subject: Prior Authorization                            Patient needs MRI in next 1-2 weeks.  Darlena, Let me know when PA is done. Dr. Irene Limbo, Hillard Danker will need your office note to do this.  Thanks, Manuela Schwartz

## 2016-07-17 NOTE — Telephone Encounter (Signed)
Called patient, no answer. RN left voicemail for call back.

## 2016-07-17 NOTE — Telephone Encounter (Signed)
Informed patient of information below. Patient verbalized understanding.

## 2016-07-17 NOTE — Progress Notes (Signed)
[name]    HEMATOLOGY/ONCOLOGY CLINIC NOTE  Date of Service: .07/15/2016  Patient Care Team: [name], MD as PCP - General (Family Medicine) Scott Gallagher M.D. (general surgery)  CHIEF COMPLAINTS/PURPOSE OF CONSULTATION:  Post hospitalization followup for colon cancer  HISTORY OF PRESENTING ILLNESS:   Scott Gallagher is a wonderful 45 y.o. male who has been referred to [place] by Dr .Scott Hausen, MD for evaluation and management of newly diagnosed colon cancer likely with metastases to the liver.  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 He noted some fatigue but no other acute symptoms or overt GI bleeding.  Patient required transfusion of 2 units of PRBCs and IV iron. He subsequently had a colonoscopy which showed a near obstructing mass of his ascending colon close to the cecum which appeared concerning for colon cancer.Biopsy showed atleast high grade adenomatous dysplasia. CT chest /abd/pelvis was done on 06/08/2016 and showed 3.7 cm in length area of narrowing of the ascending colon, which lies 2 cm above the ileocecal junction, initially felt to likely reflect an area of spasm, but consistent with a constricting lesion. There are 2 adjacent sub cm shotty mesenteric lymph nodes, the largest measuring 9 mm in short axis.12 mm low-density lesion in the anterior segment of the right lobe. Liver otherwise unremarkable.  Patient underwent laparoscopic assisted rt hemicolectomy on 06/17/2016 , liver was surveyed and an umbilicated nodule was noted on the rt lobe of liver and a biopsy of this was done that revealed adenocarcinoma on frozen section.  Was seen in the hospital and was referred to continue follow-up with Dr. Marin Olp but chooses to continue  follow-up here since it is close to where he lives and that it would be more convenient to him.   We discussed his pathology results and details. We discussed the need for a PET/CT scan towards the end of the month to have an accurate baseline staging and more specifically to evaluate residual disease in his liver and other sites since this will determine whether he has isolated oligo metastatic disease in the liver or if it is more widespread.  We discussed the rationale, attention adverse effects, advantages, alternatives to FOLFOX chemotherapy. We discussed the role of port placement. We discussed the specifics of chemotherapy and all his questions were answered in great details.   He is currently healing very well after surgery with incisions having close up nicely. Mild postoperative discomfort but good bowel movements and no other acute issues. No evidence of rectal bleeding.  INTERVAL HISTORY  Patient is here for follow-up prior to starting his first cycle of FOLFOX. He had a Port-A-Cath placement by Dr. Hassell Done on Monday. He has healed very well after surgery and notes normal bowel movements. His hemoglobin has improved to near normal and he is eating well so after discussion with him we discontinued his IV Feraheme scheduled for today. He has completed his chemotherapy counseling. All his and his wife's questions of been answered in details. He feels ready to proceed with FOLFOX chemotherapy.   MEDICAL HISTORY:  Past Medical History:  Diagnosis Date  . Anemia   . Arthritis   . Cancer ([place])   . Diabetes mellitus without complication (HCC)    diet controlled  .  History of blood transfusion   . Hyperlipidemia   . Sleep apnea    cpap  . Wears glasses     SURGICAL HISTORY: Past Surgical History:  Procedure Laterality Date  . COLON SURGERY    . LAPAROSCOPIC RIGHT HEMI COLECTOMY Right 06/17/2016   Procedure: LAPAROSCOPIC ASSISTED  RIGHT HEMI COLECTOMY;  Surgeon: Scott Hausen,  MD;  Location: [facility];  Service: General;  Laterality: Right;  . PORTACATH PLACEMENT Left 07/13/2016   Procedure: INSERTION PORT-A-CATH left subclavian;  Surgeon: Scott Hausen, MD;  Location: [facility];  Service: General;  Laterality: Left;  . SHOULDER ACROMIOPLASTY Right 12/20/2014   Procedure: SHOULDER ACROMIOPLASTY;  Surgeon: Melrose Nakayama, MD;  Location: [facility];  Service: Orthopedics;  Laterality: Right;  . SHOULDER ARTHROSCOPY Right 12/20/2014   Procedure: RIGHT ARTHROSCOPY SHOULDER WITH DEBRIDEMENT;  Surgeon: Melrose Nakayama, MD;  Location: [facility];  Service: Orthopedics;  Laterality: Right;  . TESTICLE SURGERY     as teen  . TONSILLECTOMY    . WISDOM TOOTH EXTRACTION      SOCIAL HISTORY: Social History   Social History  . Marital status: Married    Spouse name: N/A  . Number of children: N/A  . Years of education: N/A   Occupational History  . cemetery maintenance    Social History Main Topics  . Smoking status: Former Smoker    Quit date: 12/16/2012  . Smokeless tobacco: Never Used  . Alcohol use 1.8 oz/week    3 Shots of liquor per week     Comment:  some days  . Drug use: No  . Sexual activity: Not on file   Other Topics Concern  . Not on file   Social History Narrative  . No narrative on file    FAMILY HISTORY: Family History  Problem Relation Age of Onset  . Cancer Other   . Diabetes Other   . Hyperlipidemia Other   . Hypertension Other     ALLERGIES:  is allergic to penicillins.  MEDICATIONS:  Current Outpatient Prescriptions  Medication Sig Dispense Refill  . atorvastatin (LIPITOR) 40 MG tablet Take 40 mg by mouth daily.    [name] dexamethasone (DECADRON) 4 MG tablet Take 2 tablets (8 mg total) by mouth daily. Start the day after chemotherapy for 2 days. Take with food. 30 tablet 1  . lidocaine-prilocaine (EMLA) cream Apply to affected area once 30 g 3  . ondansetron (ZOFRAN) 8 MG tablet Take 1 tablet (8 mg total) by  mouth 2 (two) times daily as needed for refractory nausea / vomiting. Start on day 3 after chemotherapy. 30 tablet 1  . oxyCODONE-acetaminophen (PERCOCET/ROXICET) 5-325 MG tablet Take 1-2 tablets by mouth every 4 (four) hours as needed for moderate pain. 30 tablet 0  . Probiotic Product (PROBIOTIC PO) Take 1 tablet by mouth daily.    . prochlorperazine (COMPAZINE) 10 MG tablet Take 1 tablet (10 mg total) by mouth every 6 (six) hours as needed (Nausea or vomiting). 30 tablet 1   No current facility-administered medications for this visit.     REVIEW OF SYSTEMS:    10 Point review of Systems was done is negative except as noted above.  PHYSICAL EXAMINATION: ECOG PERFORMANCE STATUS: 1 - Symptomatic but completely ambulatory  . Vitals:   07/15/16 1421  BP: 130/71  Pulse: 89  Resp: 18  Temp: 98.4 F (36.9 C)   Filed Weights   07/15/16 1421  Weight: 246 lb 7 oz (111.8 kg)   .  Body mass index is 31.64 kg/m. GENERAL:alert, in no acute distress and comfortable SKIN: skin color, texture, turgor are normal, no rashes or significant lesions EYES: normal, conjunctiva are pink and non-injected, sclera clear OROPHARYNX:no exudate, no erythema and lips, buccal mucosa, and tongue normal  NECK: supple, no JVD, thyroid normal size, non-tender, without nodularity LYMPH:  no palpable lymphadenopathy in the cervical, axillary or inguinal LUNGS: clear to auscultation with normal respiratory effort HEART: regular rate & rhythm,  no murmurs and no lower extremity edema ABDOMEN: abdomen soft, bowel sounds normoactive, healing laparoscopic surgical incisions with no discharge look clean. Musculoskeletal: no cyanosis of digits and no clubbing  PSYCH: alert & oriented x 3 with fluent speech NEURO: no focal motor/sensory deficits   LABORATORY DATA:  I have reviewed the data as listed  . CBC Latest Ref Rng & Units 07/15/2016 07/13/2016 06/19/2016  WBC 4.0 - 10.3 10e3/uL 7.1 6.2 9.7  Hemoglobin 13.0  - 17.1 g/dL 12.1(L) 11.7(L) 9.8(L)  Hematocrit 38.4 - 49.9 % 38.8 37.5(L) 32.9(L)  Platelets 140 - 400 10e3/uL 281 315 402(H)    . CMP Latest Ref Rng & Units 07/15/2016 07/13/2016 06/18/2016  Glucose 70 - 140 mg/dl 86 102(H) 120(H)  BUN 7.0 - 26.0 mg/dL 13.5 19 13   Creatinine 0.7 - 1.3 mg/dL 0.9 0.88 0.97  Sodium 136 - 145 mEq/L 142 140 136  Potassium 3.5 - 5.1 mEq/L 4.4 4.2 4.2  Chloride 101 - 111 mmol/L - 108 103  CO2 22 - 29 mEq/L 27 23 25   Calcium 8.4 - 10.4 mg/dL 9.4 9.1 9.0  Total Protein 6.4 - 8.3 g/dL 7.3 - -  Total Bilirubin 0.20 - 1.20 mg/dL 0.40 - -  Alkaline Phos 40 - 150 U/L 98 - -  AST 5 - 34 U/L 24 - -  ALT 0 - 55 U/L 42 - -   . Lab Results  Component Value Date   IRON 30 (L) 07/15/2016   TIBC 415 (H) 07/15/2016   IRONPCTSAT 7 (L) 07/15/2016   (Iron and TIBC)  Lab Results  Component Value Date   FERRITIN 13 (L) 07/15/2016     Microscopic Comment 2. COLON AND RECTUM (INCLUDING TRANS-ANAL RESECTION): Specimen: Terminal ileum, right colon and appendix Procedure: Segmental resection Tumor site: Proximal ascending colon Specimen integrity: Intact Macroscopic intactness of mesorectum: Not applicable: x Complete: NA Near complete: NA Incomplete: NA Cannot be determined (specify): NA Macroscopic tumor perforation: The mass invades through muscularis propria into pericolonic soft tissue Invasive tumor: Maximum size: 5.5 cm Histologic type(s): Adenocarcinoma Histologic grade and differentiation: G3 G1: well differentiated/low grade 1 of 4 Supplemental copy SUPPLEMENTAL for Stucki, Suyash ([id number]) Microscopic Comment(continued) G2: moderately differentiated/low grade G3: poorly differentiated/high grade G4: undifferentiated/high grade Type of polyp in which invasive carcinoma arose: Tubular adenoma Microscopic extension of invasive tumor: The mass invade through the muscularis propria into pericolonic soft tissue Lymph-Vascular invasion:  Identified Peri-neural invasion: Identified Tumor deposit(s) (discontinuous extramural extension): Present Resection margins: Proximal margin: Negative Distal margin: Negative Circumferential (radial) (posterior ascending, posterior descending; lateral and posterior mid-rectum; and entire lower 1/3 rectum):Negative Mesenteric margin (sigmoid and transverse): NA Distance closest margin (if all above margins negative): 3.7 cm from the non peritonealized pericolic soft tissue margin Trans-anal resection margins only: Deep margin: NA Mucosal Margin: NA Distance closest mucosal margin (if negative): NA Treatment effect (neo-adjuvant therapy): NA Additional polyp(s): Negative Non-neoplastic findings: Unremarkable Lymph nodes: number examined 18; number positive: 5 Pathologic Staging: pT3, N2a, M1a Ancillary studies: MSI ordered  RADIOGRAPHIC STUDIES: I have personally reviewed the radiological images as listed and agreed with the findings in the report. Nm Pet Image Initial (pi) Skull Base To Thigh  Result Date: 07/15/2016 CLINICAL DATA:  Subsequent treatment strategy for colorectal carcinoma with liver metastasis. EXAM: NUCLEAR MEDICINE PET SKULL BASE TO THIGH TECHNIQUE: 12.1 mCi F-18 FDG was injected intravenously. Full-ring PET imaging was performed from the skull base to thigh after the radiotracer. CT data was obtained and used for attenuation correction and anatomic localization. FASTING BLOOD GLUCOSE:  Value: 86 mg/dl COMPARISON:  CT abdomen 05/24/1999 17 FINDINGS: NECK No hypermetabolic lymph nodes in the neck. CHEST No hypermetabolic mediastinal or hilar nodes. No suspicious pulmonary nodules on the CT scan. ABDOMEN/PELVIS Interval RIGHT hemicolectomy. No hypermetabolic activity at the anastomosis. There several hypermetabolic foci within liver consistent metastasis. Largest lesion in the RIGHT hepatic lobe corresponds low-density lesions seen on comparison CT and has intense  metabolic activity SUV max equal 30. There approximately 4 small lesion identified including small lesion in the caudate lobe. Hypermetabolic nodule along the LEFT pericolic gutter measuring 9 mm (image 134, series 4). This activity is intense for size with SUV max 14. This nodule is increased slightly in the interval from 7 mm on comparison CT. Second very subtle hypermetabolic peritoneal nodule adjacent to small bowel of the RIGHT lower quadrant and just ventral to the posterior peritoneal reflection. Nodule is very small measuring 5 mm on image 152, series 4; however has intense metabolic activity for size with SUV max equal 15.3. SKELETON No focal hypermetabolic activity to suggest skeletal metastasis. IMPRESSION: 1. Multi foci hypermetabolic HEPATIC METASTASIS. 2. Two hypermetabolic peritoneal nodules along the dorsal peritoneal surface are concerning for peritoneal metastasis. One along the LEFT pericolic gutter and one anterior to the RIGHT psoas muscle. 3. Interval RIGHT hemicolectomy. Electronically Signed   By: Suzy Bouchard M.D.   On: 07/15/2016 17:08   Dg Chest Port 1 View  Result Date: 07/13/2016 CLINICAL DATA:  Port-A-Cath insertion EXAM: PORTABLE CHEST 1 VIEW COMPARISON:  CT from 05/23/2016 FINDINGS: There is a new left-sided port catheter with tip in the distal SVC. No pneumothorax is identified. Chronic eventration of the right hemidiaphragm with adjacent right basilar atelectasis. No pulmonary edema or significant effusion. No suspicious osseous abnormality. IMPRESSION: Left-sided port catheter with tip in the distal SVC. No pneumothorax. Right basilar atelectasis. Electronically Signed   By: Ashley Royalty M.D.   On: 07/13/2016 14:36   Dg C-arm 1-60 Min-no Report  Result Date: 07/16/2016 Fluoroscopy was utilized by the requesting physician.  No radiographic interpretation.   CT CHEST, ABDOMEN, AND PELVIS WITH CONTRAST 05/23/2016  TECHNIQUE: Multidetector CT imaging of the chest,  abdomen and pelvis was performed following the standard protocol during bolus administration of intravenous contrast.  CONTRAST:  154m ISOVUE-300 IOPAMIDOL (ISOVUE-300) INJECTION 61%  COMPARISON:  None.  FINDINGS: CT CHEST FINDINGS  Cardiovascular: Heart is normal in size and configuration. No coronary artery calcifications. Great vessels normal in caliber. No aortic atherosclerosis.  Mediastinum/Nodes: No enlarged mediastinal, hilar, or axillary lymph nodes. Thyroid gland, trachea, and esophagus demonstrate no significant findings.  Lungs/Pleura: Minor subsegmental atelectasis in the right lower lobe. Lungs otherwise clear. No pleural effusion or pneumothorax.  Musculoskeletal: No chest wall abnormality. No acute or significant osseous findings.  CT ABDOMEN PELVIS FINDINGS  Hepatobiliary: 12 mm low-density lesion in the anterior segment of the right lobe. Liver otherwise unremarkable. Normal gallbladder. No bile duct dilation.  Pancreas: Unremarkable. No pancreatic ductal dilatation or  surrounding inflammatory changes.  Spleen: Normal in size without focal abnormality.  Adrenals/Urinary Tract: No adrenal masses. Single low-density masses in each kidney measuring 13 mm, in the mid to lower pole on each side, consistent with cysts. No other renal masses, no stones and no hydronephrosis. Normal ureters. Normal bladder.  Stomach/Bowel: Stomach is within normal limits. Appendix appears normal. No evidence of bowel wall thickening, distention, or inflammatory changes.  Vascular/Lymphatic: Minor inferior aorta atherosclerotic calcifications. No aneurysm. No adenopathy.  Reproductive: Prostate is unremarkable.  Other: No abdominal wall hernia or abnormality. No abdominopelvic ascites.  Musculoskeletal: Unremarkable.  IMPRESSION: 1. No acute findings within the chest, abdomen or pelvis. 2. No findings to account for chronic blood loss. 3. 12 mm  low-density liver lesion. This is not as low in attenuation as would be expected for a cyst. The lesion is nonspecific. This could be further assessed with liver MRI with and without contrast. 4. Single renal cyst bilaterally. 5. Minor aortic atherosclerosis.  Electronically Signed: By: Lajean Manes M.D. 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  PET/CT scan done on 06/25/2016 Show multiple foci of hypermetabolic hepatic metastases (atleast 4-5) and 2 hypermetabolic better toenail nodules along the dorsal peritoneal surface concerning for peritoneal metastases. He will need an MRI of the liver and abdomen to evaluate his liver metastases in more details to help the surgeons make a decision of whether he might be a surgical candidate for hepatic metastatectomy.  Baseline CEA . Lab Results  Component Value Date   CEA 6.1 (H) 06/20/2016    PLAN -Labs are stable and improved and the patient is appropriate to proceed with FOLFOX chemotherapy today. Port-A-Cath in place by Dr. Hassell Done - MRI of the liver and abdomen with and without contrast to have more definitively understanding of his liver involvement and abdominal peritoneal involvement to determine if he might be a candidate for surgical metastectomy after initial chemotherapy . -We shall recheck him to Dr. Hassell Done to get his input regarding this after his MRI. -If he is a surgical candidate for hepatic and peritoneal metastatectomy would consider more aggressive treatment with possibly FOLFOXIRI vs FOLFOX + (Avastin or EGFR directed therapy - based on KRAS testing results). -pending Foundation 1 testing on his tissue as well and MSI testing to understand other treatment options as  necessary.   #2. 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 Lab Results  Component Value Date   IRON 30 (L) 07/15/2016   TIBC 415 (H) 07/15/2016   IRONPCTSAT 7 (L) 07/15/2016   (Iron and TIBC)  Lab Results  Component Value Date   FERRITIN 13 (L) 07/15/2016   Plan -Patient's hemoglobin has nearly normalized . -I discussed with him and he would like to hold off on IV Feraheme at this time . -He will take oral ferrous sulfate 1 tablet by mouth daily with a multivitamin . - we'll monitor this to determine need for IV iron .   #3  Patient Active Problem List   Diagnosis Date Noted  . Metastatic colon cancer to liver ([place]) 07/01/2016  . Right Colon cancer metastasized to liver ([place]) 06/17/2016  . Chronic GI bleeding 05/29/2016  . Diabetes mellitus  type 2, diet-controlled ([place]) 05/22/2016  . OSA (obstructive sleep apnea) 05/22/2016  . Hyperlipidemia 05/22/2016  . Anemia due to blood loss, chronic   . Anemia 05/21/2016   -Continue follow-up with primary care physician .Gara Kroner, MD for optimization of diabetes management . Might need basal insulin if his blood sugars are elevated with very chemotherapy steroids . -Continue use of CPAP for sleep apnea . -Consider discontinuation of statins while on chemotherapy .  Return to clinic with Dr. Irene Limbo in 1 week with repeat labs for toxicity check MRI abdomen with and without contrast in 1-2 weeks.  All of the patients questions were answered with apparent satisfaction. The patient knows to call the clinic with any problems, questions or concerns.  I spent 25 minutes counseling the patient face to face. The total time spent in the appointment was 30 minutes and more than 50% was on counseling and direct patient cares.    Sullivan Lone MD [place] AAHIVMS [facility] [address] Hematology/Oncology Physician [facility]  (Office):        [phone] (Work cell):  [phone] (Fax):           [phone]

## 2016-07-20 ENCOUNTER — Encounter (HOSPITAL_COMMUNITY): Payer: Self-pay

## 2016-07-21 ENCOUNTER — Encounter (HOSPITAL_COMMUNITY): Payer: Self-pay

## 2016-07-22 ENCOUNTER — Ambulatory Visit (HOSPITAL_BASED_OUTPATIENT_CLINIC_OR_DEPARTMENT_OTHER): Payer: 59 | Admitting: Hematology

## 2016-07-22 ENCOUNTER — Other Ambulatory Visit (HOSPITAL_BASED_OUTPATIENT_CLINIC_OR_DEPARTMENT_OTHER): Payer: 59

## 2016-07-22 ENCOUNTER — Telehealth: Payer: Self-pay | Admitting: Genetic Counselor

## 2016-07-22 ENCOUNTER — Encounter: Payer: Self-pay | Admitting: Hematology

## 2016-07-22 VITALS — BP 114/75 | HR 83 | Temp 98.3°F | Resp 18 | Ht 74.0 in | Wt 246.1 lb

## 2016-07-22 DIAGNOSIS — C182 Malignant neoplasm of ascending colon: Secondary | ICD-10-CM

## 2016-07-22 DIAGNOSIS — C189 Malignant neoplasm of colon, unspecified: Secondary | ICD-10-CM

## 2016-07-22 DIAGNOSIS — C787 Secondary malignant neoplasm of liver and intrahepatic bile duct: Principal | ICD-10-CM

## 2016-07-22 DIAGNOSIS — K922 Gastrointestinal hemorrhage, unspecified: Secondary | ICD-10-CM

## 2016-07-22 DIAGNOSIS — E119 Type 2 diabetes mellitus without complications: Secondary | ICD-10-CM

## 2016-07-22 DIAGNOSIS — D500 Iron deficiency anemia secondary to blood loss (chronic): Secondary | ICD-10-CM | POA: Diagnosis not present

## 2016-07-22 LAB — COMPREHENSIVE METABOLIC PANEL
ALBUMIN: 3.6 g/dL (ref 3.5–5.0)
ALK PHOS: 84 U/L (ref 40–150)
ALT: 30 U/L (ref 0–55)
AST: 15 U/L (ref 5–34)
Anion Gap: 10 mEq/L (ref 3–11)
BILIRUBIN TOTAL: 0.26 mg/dL (ref 0.20–1.20)
BUN: 17.3 mg/dL (ref 7.0–26.0)
CO2: 26 mEq/L (ref 22–29)
Calcium: 9 mg/dL (ref 8.4–10.4)
Chloride: 104 mEq/L (ref 98–109)
Creatinine: 0.9 mg/dL (ref 0.7–1.3)
EGFR: >90 mL/min/{1.73_m2} (ref >90)
GLUCOSE: 110 mg/dL (ref 70–140)
Potassium: 4.3 mEq/L (ref 3.5–5.1)
SODIUM: 140 meq/L (ref 136–145)
TOTAL PROTEIN: 6.8 g/dL (ref 6.4–8.3)

## 2016-07-22 LAB — CBC & DIFF AND RETIC
BASO%: 0.3 % (ref 0.0–2.0)
Basophils Absolute: 0 10*3/uL (ref 0.0–0.1)
EOS ABS: 0.7 10*3/uL — AB (ref 0.0–0.5)
EOS%: 11.8 % — ABNORMAL HIGH (ref 0.0–7.0)
HCT: 36.6 % — ABNORMAL LOW (ref 38.4–49.9)
HEMOGLOBIN: 11.5 g/dL — AB (ref 13.0–17.1)
IMMATURE RETIC FRACT: 8.6 % (ref 3.00–10.60)
LYMPH%: 30.4 % (ref 14.0–49.0)
MCH: 24.8 pg — ABNORMAL LOW (ref 27.2–33.4)
MCHC: 31.4 g/dL — ABNORMAL LOW (ref 32.0–36.0)
MCV: 78.9 fL — ABNORMAL LOW (ref 79.3–98.0)
MONO#: 0.4 10*3/uL (ref 0.1–0.9)
MONO%: 7.6 % (ref 0.0–14.0)
NEUT%: 49.9 % (ref 39.0–75.0)
NEUTROS ABS: 2.9 10*3/uL (ref 1.5–6.5)
Platelets: 250 10*3/uL (ref 140–400)
RBC: 4.64 10*6/uL (ref 4.20–5.82)
RDW: 23.3 % — AB (ref 11.0–14.6)
RETIC %: 0.41 % — AB (ref 0.80–1.80)
Retic Ct Abs: 19.02 10*3/uL — ABNORMAL LOW (ref 34.80–93.90)
WBC: 5.8 10*3/uL (ref 4.0–10.3)
lymph#: 1.8 10*3/uL (ref 0.9–3.3)

## 2016-07-22 NOTE — Telephone Encounter (Signed)
Pt was in office and was able to schedule and confirm appt. For genetics

## 2016-07-23 ENCOUNTER — Ambulatory Visit (HOSPITAL_BASED_OUTPATIENT_CLINIC_OR_DEPARTMENT_OTHER): Payer: 59 | Admitting: Genetic Counselor

## 2016-07-23 ENCOUNTER — Other Ambulatory Visit: Payer: 59

## 2016-07-23 DIAGNOSIS — Z800 Family history of malignant neoplasm of digestive organs: Secondary | ICD-10-CM | POA: Diagnosis not present

## 2016-07-23 DIAGNOSIS — C189 Malignant neoplasm of colon, unspecified: Secondary | ICD-10-CM

## 2016-07-23 DIAGNOSIS — Z803 Family history of malignant neoplasm of breast: Secondary | ICD-10-CM

## 2016-07-23 DIAGNOSIS — Z801 Family history of malignant neoplasm of trachea, bronchus and lung: Secondary | ICD-10-CM

## 2016-07-23 DIAGNOSIS — Z8042 Family history of malignant neoplasm of prostate: Secondary | ICD-10-CM

## 2016-07-23 DIAGNOSIS — C787 Secondary malignant neoplasm of liver and intrahepatic bile duct: Secondary | ICD-10-CM

## 2016-07-23 DIAGNOSIS — Z1379 Encounter for other screening for genetic and chromosomal anomalies: Secondary | ICD-10-CM | POA: Diagnosis not present

## 2016-07-23 DIAGNOSIS — Z8049 Family history of malignant neoplasm of other genital organs: Secondary | ICD-10-CM

## 2016-07-24 ENCOUNTER — Encounter: Payer: Self-pay | Admitting: Genetic Counselor

## 2016-07-24 DIAGNOSIS — Z8042 Family history of malignant neoplasm of prostate: Secondary | ICD-10-CM | POA: Insufficient documentation

## 2016-07-24 DIAGNOSIS — Z800 Family history of malignant neoplasm of digestive organs: Secondary | ICD-10-CM | POA: Insufficient documentation

## 2016-07-24 NOTE — Progress Notes (Signed)
REFERRING PROVIDER: Brunetta Genera, MD  PRIMARY PROVIDER:  Gara Kroner, MD  PRIMARY REASON FOR VISIT:  1. Metastatic colon cancer to liver ([place])   2. Family history of colon cancer   3. Family history of prostate cancer   4. Family history of breast cancer in male   43. Family history of lung cancer   6. Family history of cervical cancer      HISTORY OF PRESENT ILLNESS:   Scott Gallagher, a 45 y.o. male, was seen for a [place] cancer genetics consultation at the request of Dr. Irene Limbo due to a personal history of olon cancer at 52 and family history of colon, prostate, breast, and other cancers.  Scott Gallagher presents to clinic today to discuss the possibility of a hereditary predisposition to cancer, genetic testing, and to further clarify his future cancer risks, as well as potential cancer risks for family members.   In November 2017, at the age of 25, Scott Gallagher was diagnosed with adenocarcinoma of the proximal ascending colon.  Tumor testing demonstrated low microsatellite instability and normal MMR immunohistochemistry.  However, [facility] tumor testing demonstrated microsatellite stability. This was treated with surgical resection, and he is currently undergoing chemotherapy.  Scott Gallagher reports no additional personal history of cancer.   CANCER HISTORY:    Right Colon cancer metastasized to liver [facility])   06/17/2016 Initial Diagnosis    Right Colon cancer metastasized to liver [facility])     07/17/2016 Miscellaneous    [facility] Results received         RISK FACTORS:  Colonoscopy: this was his first colonoscopy; he is unsure if additional polyps were found. Up to date with prostate exams:  No. 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 Anemia   . Arthritis   . Cancer ([place])   . Diabetes mellitus without complication (HCC)    diet controlled  . History of blood transfusion   . Hyperlipidemia   . Sleep apnea    cpap  . Wears glasses     Past Surgical History:  Procedure Laterality Date  . COLON SURGERY    . LAPAROSCOPIC RIGHT HEMI COLECTOMY Right 06/17/2016   Procedure: LAPAROSCOPIC ASSISTED  RIGHT HEMI COLECTOMY;  Surgeon: [name], MD;  Location: [facility];  Service: General;  Laterality: Right;  . PORTACATH PLACEMENT Left 07/13/2016   Procedure: INSERTION PORT-A-CATH left subclavian;  Surgeon: [name], MD;  Location: [facility];  Service: General;  Laterality: Left;  . SHOULDER ACROMIOPLASTY Right 12/20/2014   Procedure: SHOULDER ACROMIOPLASTY;  Surgeon: Melrose Nakayama, MD;  Location: [place];  Service: Orthopedics;  Laterality: Right;  . SHOULDER ARTHROSCOPY Right 12/20/2014   Procedure: RIGHT ARTHROSCOPY SHOULDER WITH DEBRIDEMENT;  Surgeon: Melrose Nakayama, MD;  Location: [place];  Service: Orthopedics;  Laterality: Right;  . TESTICLE SURGERY     as teen  . TONSILLECTOMY    . WISDOM TOOTH EXTRACTION      Social History   Social History  . Marital status: Married    Spouse name: N/A  . Number of children: N/A  . Years of education: N/A   Occupational History  . cemetery maintenance    Social History Main Topics  . Smoking status: Former Smoker  Packs/day: 1.50    Years: 29.00    Types: Cigarettes    Quit date: 12/16/2012  . Smokeless tobacco: Never Used     Comment: 1-2 ppd from age [age]y to 2014  . Alcohol use 1.8 oz/week    3 Shots of liquor per week     Comment:  some days  . Drug use: No  . Sexual activity: Not Asked   Other Topics Concern  . None   Social History Narrative  . None     FAMILY HISTORY:  We obtained a detailed, 4-generation family history.  Significant diagnoses are listed below: Family History  Problem  Relation Age of Onset  . Diabetes Other   . Hyperlipidemia Other   . Hypertension Other   . Breast cancer Maternal Aunt 70  . Prostate cancer Maternal Uncle 75  . Lung cancer Paternal Uncle 35  . Stroke Maternal Grandfather 72  . Cancer Paternal Grandmother 21    dx cancer of pancreas and colon, unknown if separate primaries  . Heart Problems Paternal Grandfather     d. 24  . Prostate cancer Maternal Uncle 51  . Breast cancer Maternal Aunt 75  . Breast cancer Maternal Aunt 68  . Cervical cancer Maternal Aunt 68  . Pancreatic cancer Maternal Aunt 54    d. 58y; heavy smoker  . Colon cancer Paternal Uncle 64    s/p partial colectomy; dx. second colon cancer at age 72-64    [name] has on son and one daughter, ages 36 and 92.  He has one full sister who is currently 13 and who has never had cancer.  She has one son and three daughters of her own.  [name] parents are both currently 61 and have not had cancer.  [name] mother has five full brothers and eight full sisters.  Two brothers have passed away--at age 35 and 63.  Both of these brothers were diagnosed with prostate cancers in their mid-50s to [place].  Two sisters have passed away.  One sister was diagnosed with breast cancer at 19 and passed away at 36.  Another sister was a heavy smoker and was diagnosed with pancreatic cancer at 31 and passed away at 42.  Six sisters and three brothers are still living, all in their late 45s-early 80s.  Two other sisters have also had breast cancer, diagnosed at age 18 and at age 65--this latter sister was also diagnosed with cervical cancer at 45.  Three of [name]'s maternal aunts are paternal half-sisters to his father, but he is not sure which ones are half and which are full sisters.  He is unaware of any cancer history for his maternal first cousins, but he has limited information for many of these relatives.  His maternal grandmother is currently 21 and has never had cancer.   His maternal grandfather died from a stroke at age 87.  He has no information for any maternal great aunts/uncles and great grandparents.  [name] father has nine full brothers and three full sisters.  One brother died of lung cancer at 74.  One brother passed away at an older age due to complications from an accident he had when he was younger.  The other brothers and sisters are still living.  One brother was diagnosed with colon cancer at 64 and with a second colon cancer between the age of 31-64.  Mr. Busic is not aware of a cancer history for his other paternal aunts and  uncles.  He has limited information for some of these relatives and their children, however.  His paternal grandmother was diagnosed with a colon cancer and/or pancreatic cancer at age 46 and she passed away at 47.  His grandfather passed away from heart-related causes at 68.  Mr. Fiumara has no information for his paternal great aunts/uncles and great grandparents.  Mr. Mcinerny is unaware of any family history of genetic testing for hereditary cancer risks.  Patient's maternal ancestors are of Native [place] descent, and paternal ancestors are of English descent. There is no reported Ashkenazi Jewish ancestry. There is no known consanguinity.  GENETIC COUNSELING ASSESSMENT: Johndavid Geralds is a 45 y.o. male with a personal and family history of cancer which is somewhat suggestive of a hereditary cancer syndrome and predisposition to cancer. We, therefore, discussed and recommended the following at today's visit.   DISCUSSION: We reviewed the characteristics, features and inheritance patterns of hereditary cancer syndromes, particularly those caused by mutations within the Lynch syndrome, APC, and BRCA1/2 (due to his additional maternal family history of prostate and breast cancers). We also discussed genetic testing, including the appropriate family members to test, the process of testing, insurance coverage and  turn-around-time for results. We discussed the implications of a negative, positive and/or variant of uncertain significant result. We recommended Mr. Zerbe pursue genetic testing for the 34-gene CancerNext Panel through [employer].  The 34-gene CancerNext Panel offered by [employer] [facility] [place]) includes sequencing and/or deletion/duplication analysis of the following genes: APC, ATM, BARD1, BRCA1, BRCA2, BRIP1, BMPR1A, CDH1, CDK4, CDKN2A, CHEK2, DICER1, EPCAM, GREM1, HOXB13, MLH1, MRE11A, MSH2, MSH6, MUTYH, NBN, NF1, PALB2, PMS2, POLD1, POLE, PTEN, RAD50, RAD51C, RAD51D, SMAD4, SMARCA4, STK11, and TP53.  We also discussed adding TumorNext-Lynch testing through [facility] for no additional cost (beyond whatever he would be responsible for for germline testing) to Mr. Wolak.  While Mr. Rayburn has already had some tumor testing following his colon cancer surgery, this tumor testing is more comprehensive and, because it is paired with germline/hereditary cancer testing, can sometimes provide [place] with additional insight pertaining to chances for Lynch syndrome status, if germline testing finds no MMR gene mutations.  This additional testing may or may not be an option for [place], as testing depends on amount of tumor still available and Dr. Grier Mitts timeline for getting these results back for him (adding TumorNext-Lynch testing means that results will come back in 6 weeks as opposed to 3-4 weeks.)  Mr. Bunte agrees with this plan.  Based on Mr. Kinner personal and family history of cancer, he meets medical criteria for genetic testing. Despite that he meets criteria, he may still have an out of pocket cost. We discussed that if his out of pocket cost for testing is over $100, the laboratory will call and confirm whether he wants to proceed with testing.  If the out of pocket cost of testing is less than $100 he will be billed by the genetic testing laboratory.   PLAN:  After considering the risks, benefits, and limitations, Mr. Sheerin  provided informed consent to pursue genetic testing and the blood sample was sent to [facility] for analysis of the 34-gene CancerNext Panel.  Additionally, the colon tumor sample will be sent from our surgical lab to [facility] for TumorNext-Lynch testing, if enough sample is present for testing and Dr. Irene Limbo agrees with this plan.  Results should be available within approximately 6 weeks' time, if tumor testing is included (  or 3-4 weeks if we are only ordering blood testing), at which point they will be disclosed by telephone to Mr. Sharman, as will any additional recommendations warranted by these results. Mr. Azer will receive a summary of his genetic counseling visit and a copy of his results once available. This information will also be available in Epic. We encouraged Mr. Cowden to remain in contact with cancer genetics annually so that we can continuously update the family history and inform him of any changes in cancer genetics and testing that may be of benefit for his family. Mr. Penson questions were answered to his satisfaction today. Our contact information was provided should additional questions or concerns arise.  Thank you for the referral and allowing [place] to share in the care of your patient.   Jeanine Luz, MS, [facility] Certified Genetic Counselor [email or web address] Phone: [phone]  The patient was seen for a total of 60 minutes in face-to-face genetic counseling.  This patient was discussed with Drs. Magrinat, Lindi Adie and/or Burr Medico who agrees with the above.    _______________________________________________________________________ For Office Staff:  Number of people involved in session: 1 Was an Intern/ student involved with case: no

## 2016-07-27 ENCOUNTER — Telehealth: Payer: Self-pay | Admitting: General Practice

## 2016-07-27 ENCOUNTER — Ambulatory Visit (HOSPITAL_COMMUNITY)
Admission: RE | Admit: 2016-07-27 | Discharge: 2016-07-27 | Disposition: A | Discharge status: Home / Self Care | Payer: 59 | Source: Ambulatory Visit | Attending: Hematology | Admitting: Hematology

## 2016-07-27 DIAGNOSIS — C189 Malignant neoplasm of colon, unspecified: Secondary | ICD-10-CM | POA: Diagnosis not present

## 2016-07-27 DIAGNOSIS — C787 Secondary malignant neoplasm of liver and intrahepatic bile duct: Secondary | ICD-10-CM | POA: Insufficient documentation

## 2016-07-27 MED ORDER — GADOBENATE DIMEGLUMINE 529 MG/ML IV SOLN
20.0000 mL | Freq: Once | INTRAVENOUS | Status: AC | PRN
  Administered 2016-07-27: 20 mL via INTRAVENOUS

## 2016-07-27 NOTE — Progress Notes (Signed)
[name]    HEMATOLOGY/ONCOLOGY CLINIC NOTE  Date of Service: .07/22/2016  Patient Care Team: [name], MD as PCP - General (Family Medicine) [name] M.D. (general surgery)  CHIEF COMPLAINTS/PURPOSE OF CONSULTATION:  Post hospitalization followup for colon cancer  HISTORY OF PRESENTING ILLNESS:   Scott Gallagher is a wonderful 45 y.o. male who has been referred to [place] by Dr .[name], MD for evaluation and management of newly diagnosed colon cancer likely with metastases to the liver.  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 He noted some fatigue but no other acute symptoms or overt GI bleeding.  Patient required transfusion of 2 units of PRBCs and IV iron. He subsequently had a colonoscopy which showed a near obstructing mass of his ascending colon close to the cecum which appeared concerning for colon cancer.Biopsy showed atleast high grade adenomatous dysplasia. CT chest /abd/pelvis was done on 06/08/2016 and showed 3.7 cm in length area of narrowing of the ascending colon, which lies 2 cm above the ileocecal junction, initially felt to likely reflect an area of spasm, but consistent with a constricting lesion. There are 2 adjacent sub cm shotty mesenteric lymph nodes, the largest measuring 9 mm in short axis.12 mm low-density lesion in the anterior segment of the right lobe. Liver otherwise unremarkable.  Patient underwent laparoscopic assisted rt hemicolectomy on 06/17/2016 , liver was surveyed and an umbilicated nodule was noted on the rt lobe of liver and a biopsy of this was done that revealed adenocarcinoma on frozen section.  Was seen in the hospital and was referred to continue follow-up with Dr. Marin Olp but chooses to continue  follow-up here since it is close to where he lives and that it would be more convenient to him.   We discussed his pathology results and details. We discussed the need for a PET/CT scan towards the end of the month to have an accurate baseline staging and more specifically to evaluate residual disease in his liver and other sites since this will determine whether he has isolated oligo metastatic disease in the liver or if it is more widespread.  We discussed the rationale, attention adverse effects, advantages, alternatives to FOLFOX chemotherapy. We discussed the role of port placement. We discussed the specifics of chemotherapy and all his questions were answered in great details.   He is currently healing very well after surgery with incisions having close up nicely. Mild postoperative discomfort but good bowel movements and no other acute issues. No evidence of rectal bleeding.  INTERVAL HISTORY  Patient is here for follow-up after his first cycle of FOLFOX for toxicity check. Patient notes no acute issues with the 1st cycle of FOLFOX chemotherapy. He is awaiting his MRI of the abdomen. We discussed referral to genetic counseling and he would like to proceed with it. Labs are stable. No other acute new concerns. He and his wife are still coming to grip with his diagnosis and prognosis. Foundation One results discussed in details.   MEDICAL HISTORY:  Past Medical History:  Diagnosis Date  . Anemia   . Arthritis   . Cancer ([place])   . Diabetes mellitus without complication (HCC)    diet controlled  . History of blood transfusion   . Hyperlipidemia   .  Sleep apnea    cpap  . Wears glasses     SURGICAL HISTORY: Past Surgical History:  Procedure Laterality Date  . COLON SURGERY    . LAPAROSCOPIC RIGHT HEMI COLECTOMY Right 06/17/2016   Procedure: LAPAROSCOPIC ASSISTED  RIGHT HEMI COLECTOMY;  Surgeon: [name], MD;  Location: [facility];  Service: General;  Laterality: Right;  .  PORTACATH PLACEMENT Left 07/13/2016   Procedure: INSERTION PORT-A-CATH left subclavian;  Surgeon: [name], MD;  Location: [facility];  Service: General;  Laterality: Left;  . SHOULDER ACROMIOPLASTY Right 12/20/2014   Procedure: SHOULDER ACROMIOPLASTY;  Surgeon: Melrose Nakayama, MD;  Location: [facility];  Service: Orthopedics;  Laterality: Right;  . SHOULDER ARTHROSCOPY Right 12/20/2014   Procedure: RIGHT ARTHROSCOPY SHOULDER WITH DEBRIDEMENT;  Surgeon: Melrose Nakayama, MD;  Location: [place];  Service: Orthopedics;  Laterality: Right;  . TESTICLE SURGERY     as teen  . TONSILLECTOMY    . WISDOM TOOTH EXTRACTION      SOCIAL HISTORY: Social History   Social History  . Marital status: Married    Spouse name: N/A  . Number of children: N/A  . Years of education: N/A   Occupational History  . cemetery maintenance    Social History Main Topics  . Smoking status: Former Smoker    Packs/day: 1.50    Years: 29.00    Types: Cigarettes    Quit date: 12/16/2012  . Smokeless tobacco: Never Used     Comment: 1-2 ppd from age [age]y to 2014  . Alcohol use 1.8 oz/week    3 Shots of liquor per week     Comment:  some days  . Drug use: No  . Sexual activity: Not on file   Other Topics Concern  . Not on file   Social History Narrative  . No narrative on file    FAMILY HISTORY: Family History  Problem Relation Age of Onset  . Diabetes Other   . Hyperlipidemia Other   . Hypertension Other   . Breast cancer Maternal Aunt 70  . Prostate cancer Maternal Uncle 75  . Lung cancer Paternal Uncle 13  . Stroke Maternal Grandfather 72  . Cancer Paternal Grandmother 70    dx cancer of pancreas and colon, unknown if separate primaries  . Heart Problems Paternal Grandfather     d. 36  . Prostate cancer Maternal Uncle 31  . Breast cancer Maternal Aunt 75  . Breast cancer Maternal Aunt 68  . Cervical cancer Maternal Aunt 68  . Pancreatic cancer Maternal Aunt 54     d. 58y; heavy smoker  . Colon cancer Paternal Uncle 16    s/p partial colectomy; dx. second colon cancer at age 20-64    ALLERGIES:  is allergic to penicillins.  MEDICATIONS:  Current Outpatient Prescriptions  Medication Sig Dispense Refill  . Multiple Vitamin (MULTIVITAMIN) tablet Take 1 tablet by mouth daily.    [name] dexamethasone (DECADRON) 4 MG tablet Take 2 tablets (8 mg total) by mouth daily. Start the day after chemotherapy for 2 days. Take with food. 30 tablet 1  . lidocaine-prilocaine (EMLA) cream Apply to affected area once 30 g 3  . ondansetron (ZOFRAN) 8 MG tablet Take 1 tablet (8 mg total) by mouth 2 (two) times daily as needed for refractory nausea / vomiting. Start on day 3 after chemotherapy. 30 tablet 1  . oxyCODONE-acetaminophen (PERCOCET/ROXICET) 5-325 MG tablet Take 1-2 tablets by mouth every 4 (four) hours as needed  for moderate pain. 30 tablet 0  . Probiotic Product (PROBIOTIC PO) Take 1 tablet by mouth daily.    . prochlorperazine (COMPAZINE) 10 MG tablet Take 1 tablet (10 mg total) by mouth every 6 (six) hours as needed (Nausea or vomiting). 30 tablet 1   No current facility-administered medications for this visit.     REVIEW OF SYSTEMS:    10 Point review of Systems was done is negative except as noted above.  PHYSICAL EXAMINATION: ECOG PERFORMANCE STATUS: 1 - Symptomatic but completely ambulatory  . Vitals:   07/22/16 1213  BP: 114/75  Pulse: 83  Resp: 18  Temp: 98.3 F (36.8 C)   Filed Weights   07/22/16 1213  Weight: 246 lb 1.6 oz (111.6 kg)   .Body mass index is 31.6 kg/m. GENERAL:alert, in no acute distress and comfortable SKIN: skin color, texture, turgor are normal, no rashes or significant lesions EYES: normal, conjunctiva are pink and non-injected, sclera clear OROPHARYNX:no exudate, no erythema and lips, buccal mucosa, and tongue normal  NECK: supple, no JVD, thyroid normal size, non-tender, without nodularity LYMPH:  no palpable  lymphadenopathy in the cervical, axillary or inguinal LUNGS: clear to auscultation with normal respiratory effort HEART: regular rate & rhythm,  no murmurs and no lower extremity edema ABDOMEN: abdomen soft, bowel sounds normoactive, healing laparoscopic surgical incisions with no discharge look clean. Musculoskeletal: no cyanosis of digits and no clubbing  PSYCH: alert & oriented x 3 with fluent speech NEURO: no focal motor/sensory deficits   LABORATORY DATA:  I have reviewed the data as listed  . CBC Latest Ref Rng & Units 07/22/2016 07/15/2016 07/13/2016  WBC 4.0 - 10.3 10e3/uL 5.8 7.1 6.2  Hemoglobin 13.0 - 17.1 g/dL 11.5(L) 12.1(L) 11.7(L)  Hematocrit 38.4 - 49.9 % 36.6(L) 38.8 37.5(L)  Platelets 140 - 400 10e3/uL 250 281 315    . CMP Latest Ref Rng & Units 07/22/2016 07/15/2016 07/13/2016  Glucose 70 - 140 mg/dl 110 86 102(H)  BUN 7.0 - 26.0 mg/dL 17.3 13.5 19  Creatinine 0.7 - 1.3 mg/dL 0.9 0.9 0.88  Sodium 136 - 145 mEq/L 140 142 140  Potassium 3.5 - 5.1 mEq/L 4.3 4.4 4.2  Chloride 101 - 111 mmol/L - - 108  CO2 22 - 29 mEq/L '26 27 23  '$ Calcium 8.4 - 10.4 mg/dL 9.0 9.4 9.1  Total Protein 6.4 - 8.3 g/dL 6.8 7.3 -  Total Bilirubin 0.20 - 1.20 mg/dL 0.26 0.40 -  Alkaline Phos 40 - 150 U/L 84 98 -  AST 5 - 34 U/L 15 24 -  ALT 0 - 55 U/L 30 42 -      Microscopic Comment 2. COLON AND RECTUM (INCLUDING TRANS-ANAL RESECTION): Specimen: Terminal ileum, right colon and appendix Procedure: Segmental resection Tumor site: Proximal ascending colon Specimen integrity: Intact Macroscopic intactness of mesorectum: Not applicable: x Complete: NA Near complete: NA Incomplete: NA Cannot be determined (specify): NA Macroscopic tumor perforation: The mass invades through muscularis propria into pericolonic soft tissue Invasive tumor: Maximum size: 5.5 cm Histologic type(s): Adenocarcinoma Histologic grade and differentiation: G3 G1: well differentiated/low grade 1 of  4 Supplemental copy SUPPLEMENTAL for Maione, Osha ([id number]) Microscopic Comment(continued) G2: moderately differentiated/low grade G3: poorly differentiated/high grade G4: undifferentiated/high grade Type of polyp in which invasive carcinoma arose: Tubular adenoma Microscopic extension of invasive tumor: The mass invade through the muscularis propria into pericolonic soft tissue Lymph-Vascular invasion: Identified Peri-neural invasion: Identified Tumor deposit(s) (discontinuous extramural extension): Present Resection margins: Proximal margin: Negative Distal  margin: Negative Circumferential (radial) (posterior ascending, posterior descending; lateral and posterior mid-rectum; and entire lower 1/3 rectum):Negative Mesenteric margin (sigmoid and transverse): NA Distance closest margin (if all above margins negative): 3.7 cm from the non peritonealized pericolic soft tissue margin Trans-anal resection margins only: Deep margin: NA Mucosal Margin: NA Distance closest mucosal margin (if negative): NA Treatment effect (neo-adjuvant therapy): NA Additional polyp(s): Negative Non-neoplastic findings: Unremarkable Lymph nodes: number examined 18; number positive: 5 Pathologic Staging: pT3, N2a, M1a Ancillary studies: MSI ordered   RADIOGRAPHIC STUDIES: I have personally reviewed the radiological images as listed and agreed with the findings in the report. Mr Abdomen W Wo Contrast  Result Date: 07/27/2016 CLINICAL DATA:  Colon carcinoma metastatic to liver. EXAM: MRI ABDOMEN WITHOUT AND WITH CONTRAST TECHNIQUE: Multiplanar multisequence MR imaging of the abdomen was performed both before and after the administration of intravenous contrast. CONTRAST:  109m MULTIHANCE GADOBENATE DIMEGLUMINE 529 MG/ML IV SOLN COMPARISON:  PET-CT on 07/15/2016 FINDINGS: Lower chest: No acute findings. Hepatobiliary: 2.1 cm peripherally enhancing mass is seen at the junction of the right and left hepatic  lobes, segments 8 and 4A on image 27/3 and 51/902. A 1.5 lesion is also seen in segment 8 on image 31/3 31/3 and 57/903. Both of these lesion showed hypermetabolic activity on recent PET are consistent with liver metastases. There also multiple other tiny sub-cm lesions throughout the right and left hepatic lobes which are best seen on T2 and DWI sequences, but are not well visualized on dynamic imaging. Multiple of these sub-cm lesions also showed hypermetabolic activity on recent PET-CT, and are consistent with liver metastases. Pancreas:  No mass or inflammatory changes. Spleen:  Within normal limits in size and appearance. Adrenals/Urinary Tract: No masses identified. Small bilateral renal cysts incidentally noted. No evidence of hydronephrosis. Stomach/Bowel: Visualized portions within the abdomen are unremarkable. Vascular/Lymphatic: No pathologically enlarged lymph nodes identified. No abdominal aortic aneurysm demonstrated. Other: 1.2 cm enhancing peritoneal nodule seen in the left paracolic gutter on image 810/258 This was also hypermetabolic on recent PET, and is suspicious for peritoneal metastasis. Musculoskeletal:  No suspicious bone lesions identified. IMPRESSION: Multiple small liver metastases throughout the right and left hepatic lobes, largest measuring 2.1 cm. 1.2 cm enhancing peritoneal nodule in left paracolic gutter, suspicious for peritoneal metastasis. Other small peritoneal nodules better visualized on recent PET-CT which showed hypermetabolic activity, also suspicious for peritoneal metastases. See prior PET-CT report. Electronically Signed   By: JEarle GellM.D.   On: 07/27/2016 10:25   Nm Pet Image Initial (pi) Skull Base To Thigh  Result Date: 07/15/2016 CLINICAL DATA:  Subsequent treatment strategy for colorectal carcinoma with liver metastasis. EXAM: NUCLEAR MEDICINE PET SKULL BASE TO THIGH TECHNIQUE: 12.1 mCi F-18 FDG was injected intravenously. Full-ring PET imaging was  performed from the skull base to thigh after the radiotracer. CT data was obtained and used for attenuation correction and anatomic localization. FASTING BLOOD GLUCOSE:  Value: 86 mg/dl COMPARISON:  CT abdomen 05/24/1999 17 FINDINGS: NECK No hypermetabolic lymph nodes in the neck. CHEST No hypermetabolic mediastinal or hilar nodes. No suspicious pulmonary nodules on the CT scan. ABDOMEN/PELVIS Interval RIGHT hemicolectomy. No hypermetabolic activity at the anastomosis. There several hypermetabolic foci within liver consistent metastasis. Largest lesion in the RIGHT hepatic lobe corresponds low-density lesions seen on comparison CT and has intense metabolic activity SUV max equal 30. There approximately 4 small lesion identified including small lesion in the caudate lobe. Hypermetabolic nodule along the LEFT pericolic gutter measuring 9 mm (  image 134, series 4). This activity is intense for size with SUV max 14. This nodule is increased slightly in the interval from 7 mm on comparison CT. Second very subtle hypermetabolic peritoneal nodule adjacent to small bowel of the RIGHT lower quadrant and just ventral to the posterior peritoneal reflection. Nodule is very small measuring 5 mm on image 152, series 4; however has intense metabolic activity for size with SUV max equal 15.3. SKELETON No focal hypermetabolic activity to suggest skeletal metastasis. IMPRESSION: 1. Multi foci hypermetabolic HEPATIC METASTASIS. 2. Two hypermetabolic peritoneal nodules along the dorsal peritoneal surface are concerning for peritoneal metastasis. One along the LEFT pericolic gutter and one anterior to the RIGHT psoas muscle. 3. Interval RIGHT hemicolectomy. Electronically Signed   By: Suzy Bouchard M.D.   On: 07/15/2016 17:08   Dg Chest Port 1 View  Result Date: 07/13/2016 CLINICAL DATA:  Port-A-Cath insertion EXAM: PORTABLE CHEST 1 VIEW COMPARISON:  CT from 05/23/2016 FINDINGS: There is a new left-sided port catheter with tip in  the distal SVC. No pneumothorax is identified. Chronic eventration of the right hemidiaphragm with adjacent right basilar atelectasis. No pulmonary edema or significant effusion. No suspicious osseous abnormality. IMPRESSION: Left-sided port catheter with tip in the distal SVC. No pneumothorax. Right basilar atelectasis. Electronically Signed   By: Ashley Royalty M.D.   On: 07/13/2016 14:36   Dg C-arm 1-60 Min-no Report  Result Date: 07/16/2016 Fluoroscopy was utilized by the requesting physician.  No radiographic interpretation.   CT CHEST, ABDOMEN, AND PELVIS WITH CONTRAST 05/23/2016  TECHNIQUE: Multidetector CT imaging of the chest, abdomen and pelvis was performed following the standard protocol during bolus administration of intravenous contrast.  CONTRAST:  169m ISOVUE-300 IOPAMIDOL (ISOVUE-300) INJECTION 61%  COMPARISON:  None.  FINDINGS: CT CHEST FINDINGS  Cardiovascular: Heart is normal in size and configuration. No coronary artery calcifications. Great vessels normal in caliber. No aortic atherosclerosis.  Mediastinum/Nodes: No enlarged mediastinal, hilar, or axillary lymph nodes. Thyroid gland, trachea, and esophagus demonstrate no significant findings.  Lungs/Pleura: Minor subsegmental atelectasis in the right lower lobe. Lungs otherwise clear. No pleural effusion or pneumothorax.  Musculoskeletal: No chest wall abnormality. No acute or significant osseous findings.  CT ABDOMEN PELVIS FINDINGS  Hepatobiliary: 12 mm low-density lesion in the anterior segment of the right lobe. Liver otherwise unremarkable. Normal gallbladder. No bile duct dilation.  Pancreas: Unremarkable. No pancreatic ductal dilatation or surrounding inflammatory changes.  Spleen: Normal in size without focal abnormality.  Adrenals/Urinary Tract: No adrenal masses. Single low-density masses in each kidney measuring 13 mm, in the mid to lower pole on each side, consistent with  cysts. No other renal masses, no stones and no hydronephrosis. Normal ureters. Normal bladder.  Stomach/Bowel: Stomach is within normal limits. Appendix appears normal. No evidence of bowel wall thickening, distention, or inflammatory changes.  Vascular/Lymphatic: Minor inferior aorta atherosclerotic calcifications. No aneurysm. No adenopathy.  Reproductive: Prostate is unremarkable.  Other: No abdominal wall hernia or abnormality. No abdominopelvic ascites.  Musculoskeletal: Unremarkable.  IMPRESSION: 1. No acute findings within the chest, abdomen or pelvis. 2. No findings to account for chronic blood loss. 3. 12 mm low-density liver lesion. This is not as low in attenuation as would be expected for a cyst. The lesion is nonspecific. This could be further assessed with liver MRI with and without contrast. 4. Single renal cyst bilaterally. 5. Minor aortic atherosclerosis.  Electronically Signed: By: DLajean ManesM.D. 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 KRAS mutated BRAF, NRAS mutation neg MSI Stable.  PET/CT scan done on 06/25/2016 Show multiple foci of hypermetabolic hepatic metastases (atleast 4-5) and 2 hypermetabolic peritoneal nodules along the dorsal peritoneal surface concerning for peritoneal metastases.  MRI Liver 07/27/2016 - Multiple small liver metastases throughout the right and left hepatic lobes, largest measuring 2.1 cm. 1.2 cm enhancing peritoneal nodule in left paracolic gutter, suspicious for peritoneal metastasis. Other small peritoneal nodules better visualized on recent PET-CT which showed hypermetabolic activity, also suspicious for peritoneal metastases.   Baseline CEA . Lab Results  Component Value  Date   CEA 6.1 (H) 06/20/2016    PLAN -Patient tolerated the first cycle of FOLFOX without any prohibitive toxicities. -Foundation one results reviewed in details with patient and Wife -subsequent to the clinic visit the patient had an MRI of the liver that shows multiple small hepatic metastases throughout the right and left hepatic lobes as well as peritoneal nodules previously noted on PET/CT scan. -To me his hepatic metastatic disease appears to be rather extensive and might not be surgically resectable . We will have Dr. Hassell Done weigh in on this to determine if liver and peritoneal disease is potentially resectable . -If his liver metastases and peripheral metastases are not resectable would add Avastin to his FOLFOX to try to achieve good long-term palliative disease controlled response . -Given his K-ras mutated status he would not be a candidate for cetuximab or panitumumab treatment. -Given Garment/textile technologist referral  #2. 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 Lab Results  Component Value Date   IRON 30 (L) 07/15/2016   TIBC 415 (H) 07/15/2016   IRONPCTSAT 7 (L) 07/15/2016   (Iron and TIBC)  Lab Results  Component Value Date   FERRITIN 13 (L) 07/15/2016   Plan -Patient's hemoglobin has nearly normalized . --continue oral ferrous sulfate 1 tablet by mouth daily with a multivitamin . - we'll monitor this to determine need for IV iron .   #3  Patient Active Problem List   Diagnosis Date Noted  . Family history of colon cancer 07/24/2016  . Family history of prostate cancer 07/24/2016  . Metastatic colon cancer to liver ([place]) 07/01/2016  . Right Colon cancer metastasized to liver ([place]) 06/17/2016  . Chronic GI bleeding 05/29/2016  . Diabetes mellitus type 2, diet-controlled ([place]) 05/22/2016  . OSA (obstructive sleep apnea) 05/22/2016  . Hyperlipidemia 05/22/2016  . Anemia due to  blood loss, chronic   . Anemia 05/21/2016   -Continue follow-up with primary care physician .Gara Kroner, MD for optimization of diabetes management . Might need basal insulin if his blood sugars are elevated with very chemotherapy steroids . -Continue use of CPAP for sleep apnea . -off statins  -Continue treatment as per plan with FOLFOX q2weeks with labs Return to clinic with Dr. Irene Limbo in 3 week with repeat labs before FOLFOX C3D1. Will wait to hear from Dr Hassell Done and then decide on adding Avastin.   All of the patients questions were answered with apparent satisfaction. The patient knows to call the clinic with any problems, questions or concerns.  I spent 20 minutes counseling the patient face to face. The total time spent in the appointment was 25 minutes and  more than 50% was on counseling and direct patient cares.    Sullivan Lone MD [place] AAHIVMS [facility] Hematology/Oncology Physician [facility]  (Office):       [phone] (Work cell):  [phone] (Fax):           [phone]

## 2016-07-27 NOTE — Telephone Encounter (Signed)
Spoke w/pt confirmed December 2017 appts.

## 2016-07-29 ENCOUNTER — Other Ambulatory Visit (HOSPITAL_BASED_OUTPATIENT_CLINIC_OR_DEPARTMENT_OTHER): Payer: 59

## 2016-07-29 ENCOUNTER — Ambulatory Visit (HOSPITAL_BASED_OUTPATIENT_CLINIC_OR_DEPARTMENT_OTHER): Payer: 59

## 2016-07-29 VITALS — BP 118/72 | HR 87 | Temp 99.0°F | Resp 18

## 2016-07-29 DIAGNOSIS — C787 Secondary malignant neoplasm of liver and intrahepatic bile duct: Secondary | ICD-10-CM | POA: Diagnosis not present

## 2016-07-29 DIAGNOSIS — C182 Malignant neoplasm of ascending colon: Secondary | ICD-10-CM

## 2016-07-29 DIAGNOSIS — C189 Malignant neoplasm of colon, unspecified: Secondary | ICD-10-CM

## 2016-07-29 DIAGNOSIS — Z5111 Encounter for antineoplastic chemotherapy: Secondary | ICD-10-CM | POA: Diagnosis not present

## 2016-07-29 LAB — CBC WITH DIFFERENTIAL/PLATELET
BASO%: 0.5 % (ref 0.0–2.0)
BASOS ABS: 0 10*3/uL (ref 0.0–0.1)
EOS%: 4.6 % (ref 0.0–7.0)
Eosinophils Absolute: 0.3 10*3/uL (ref 0.0–0.5)
HCT: 39.4 % (ref 38.4–49.9)
HEMOGLOBIN: 12.4 g/dL — AB (ref 13.0–17.1)
LYMPH%: 24.8 % (ref 14.0–49.0)
MCH: 25.3 pg — AB (ref 27.2–33.4)
MCHC: 31.5 g/dL — AB (ref 32.0–36.0)
MCV: 80.2 fL (ref 79.3–98.0)
MONO#: 0.6 10*3/uL (ref 0.1–0.9)
MONO%: 10.3 % (ref 0.0–14.0)
NEUT#: 3.6 10*3/uL (ref 1.5–6.5)
NEUT%: 59.8 % (ref 39.0–75.0)
Platelets: 224 10*3/uL (ref 140–400)
RBC: 4.91 10*6/uL (ref 4.20–5.82)
RDW: 22.4 % — AB (ref 11.0–14.6)
WBC: 6.1 10*3/uL (ref 4.0–10.3)
lymph#: 1.5 10*3/uL (ref 0.9–3.3)

## 2016-07-29 LAB — COMPREHENSIVE METABOLIC PANEL
ALT: 36 U/L (ref 0–55)
AST: 21 U/L (ref 5–34)
Albumin: 3.7 g/dL (ref 3.5–5.0)
Alkaline Phosphatase: 101 U/L (ref 40–150)
Anion Gap: 10 mEq/L (ref 3–11)
BUN: 16.6 mg/dL (ref 7.0–26.0)
CHLORIDE: 108 meq/L (ref 98–109)
CO2: 24 meq/L (ref 22–29)
Calcium: 9.2 mg/dL (ref 8.4–10.4)
Creatinine: 1 mg/dL (ref 0.7–1.3)
EGFR: >90 mL/min/{1.73_m2} (ref >90)
GLUCOSE: 109 mg/dL (ref 70–140)
POTASSIUM: 4.4 meq/L (ref 3.5–5.1)
SODIUM: 142 meq/L (ref 136–145)
Total Bilirubin: 0.35 mg/dL (ref 0.20–1.20)
Total Protein: 7.2 g/dL (ref 6.4–8.3)

## 2016-07-29 MED ORDER — OXALIPLATIN CHEMO INJECTION 100 MG/20ML
84.0000 mg/m2 | Freq: Once | INTRAVENOUS | Status: AC
  Administered 2016-07-29: 200 mg via INTRAVENOUS
  Filled 2016-07-29: qty 40

## 2016-07-29 MED ORDER — SODIUM CHLORIDE 0.9% FLUSH
10.0000 mL | INTRAVENOUS | Status: DC | PRN
  Filled 2016-07-29: qty 10

## 2016-07-29 MED ORDER — DEXAMETHASONE SODIUM PHOSPHATE 10 MG/ML IJ SOLN
10.0000 mg | Freq: Once | INTRAMUSCULAR | Status: AC
  Administered 2016-07-29: 10 mg via INTRAVENOUS

## 2016-07-29 MED ORDER — FLUOROURACIL CHEMO INJECTION 2.5 GM/50ML
400.0000 mg/m2 | Freq: Once | INTRAVENOUS | Status: AC
  Administered 2016-07-29: 950 mg via INTRAVENOUS
  Filled 2016-07-29: qty 19

## 2016-07-29 MED ORDER — LEUCOVORIN CALCIUM INJECTION 350 MG
400.0000 mg/m2 | Freq: Once | INTRAVENOUS | Status: AC
  Administered 2016-07-29: 964 mg via INTRAVENOUS
  Filled 2016-07-29: qty 48.2

## 2016-07-29 MED ORDER — DEXTROSE 5 % IV SOLN
Freq: Once | INTRAVENOUS | Status: AC
  Administered 2016-07-29: 11:00:00 via INTRAVENOUS

## 2016-07-29 MED ORDER — DEXAMETHASONE SODIUM PHOSPHATE 10 MG/ML IJ SOLN
INTRAMUSCULAR | Status: AC
  Filled 2016-07-29: qty 1

## 2016-07-29 MED ORDER — PALONOSETRON HCL INJECTION 0.25 MG/5ML
INTRAVENOUS | Status: AC
  Filled 2016-07-29: qty 5

## 2016-07-29 MED ORDER — SODIUM CHLORIDE 0.9 % IV SOLN
2400.0000 mg/m2 | INTRAVENOUS | Status: DC
  Administered 2016-07-29: 5800 mg via INTRAVENOUS
  Filled 2016-07-29: qty 116

## 2016-07-29 MED ORDER — PALONOSETRON HCL INJECTION 0.25 MG/5ML
0.2500 mg | Freq: Once | INTRAVENOUS | Status: AC
  Administered 2016-07-29: 0.25 mg via INTRAVENOUS

## 2016-07-29 NOTE — Patient Instructions (Signed)
[facility] Discharge Instructions for Patients Receiving Chemotherapy  Today you received the following chemotherapy agents: Oxaliplatin, Leucovorin, Fluorouracil   To help prevent nausea and vomiting after your treatment, we encourage you to take your nausea medication as prescribed.    If you develop nausea and vomiting that is not controlled by your nausea medication, call the clinic.   BELOW ARE SYMPTOMS THAT SHOULD BE REPORTED IMMEDIATELY:  *FEVER GREATER THAN 100.5 F  *CHILLS WITH OR WITHOUT FEVER  NAUSEA AND VOMITING THAT IS NOT CONTROLLED WITH YOUR NAUSEA MEDICATION  *UNUSUAL SHORTNESS OF BREATH  *UNUSUAL BRUISING OR BLEEDING  TENDERNESS IN MOUTH AND THROAT WITH OR WITHOUT PRESENCE OF ULCERS  *URINARY PROBLEMS  *BOWEL PROBLEMS  UNUSUAL RASH Items with * indicate a potential emergency and should be followed up as soon as possible.  Feel free to call the clinic you have any questions or concerns. The clinic phone number is [phone].  Please show the CHEMO ALERT CARD at check-in to the Emergency Department and triage nurse.  Oxaliplatin Injection What is this medicine? OXALIPLATIN (ox AL i PLA tin) is a chemotherapy drug. It targets fast dividing cells, like cancer cells, and causes these cells to die. This medicine is used to treat cancers of the colon and rectum, and many other cancers. This medicine may be used for other purposes; ask your health care provider or pharmacist if you have questions. COMMON BRAND NAME(S): Eloxatin What should I tell my health care provider before I take this medicine? They need to know if you have any of these conditions: -kidney disease -an unusual or allergic reaction to oxaliplatin, other chemotherapy, other medicines, foods, dyes, or preservatives -pregnant or trying to get pregnant -breast-feeding How should I use this medicine? This drug is given as an infusion into a vein. It is administered in a hospital  or clinic by a specially trained health care professional. Talk to your pediatrician regarding the use of this medicine in children. Special care may be needed. Overdosage: If you think you have taken too much of this medicine contact a poison control center or emergency room at once. NOTE: This medicine is only for you. Do not share this medicine with others. What if I miss a dose? It is important not to miss a dose. Call your doctor or health care professional if you are unable to keep an appointment. What may interact with this medicine? -medicines to increase blood counts like filgrastim, pegfilgrastim, sargramostim -probenecid -some antibiotics like amikacin, gentamicin, neomycin, polymyxin B, streptomycin, tobramycin -zalcitabine Talk to your doctor or health care professional before taking any of these medicines: -acetaminophen -aspirin -ibuprofen -ketoprofen -naproxen This list may not describe all possible interactions. Give your health care provider a list of all the medicines, herbs, non-prescription drugs, or dietary supplements you use. Also tell them if you smoke, drink alcohol, or use illegal drugs. Some items may interact with your medicine. What should I watch for while using this medicine? Your condition will be monitored carefully while you are receiving this medicine. You will need important blood work done while you are taking this medicine. This medicine can make you more sensitive to cold. Do not drink cold drinks or use ice. Cover exposed skin before coming in contact with cold temperatures or cold objects. When out in cold weather wear warm clothing and cover your mouth and nose to warm the air that goes into your lungs. Tell your doctor if you get sensitive to the cold.   This drug may make you feel generally unwell. This is not uncommon, as chemotherapy can affect healthy cells as well as cancer cells. Report any side effects. Continue your course of treatment even  though you feel ill unless your doctor tells you to stop. In some cases, you may be given additional medicines to help with side effects. Follow all directions for their use. Call your doctor or health care professional for advice if you get a fever, chills or sore throat, or other symptoms of a cold or flu. Do not treat yourself. This drug decreases your body's ability to fight infections. Try to avoid being around people who are sick. This medicine may increase your risk to bruise or bleed. Call your doctor or health care professional if you notice any unusual bleeding. Be careful brushing and flossing your teeth or using a toothpick because you may get an infection or bleed more easily. If you have any dental work done, tell your dentist you are receiving this medicine. Avoid taking products that contain aspirin, acetaminophen, ibuprofen, naproxen, or ketoprofen unless instructed by your doctor. These medicines may hide a fever. Do not become pregnant while taking this medicine. Women should inform their doctor if they wish to become pregnant or think they might be pregnant. There is a potential for serious side effects to an unborn child. Talk to your health care professional or pharmacist for more information. Do not breast-feed an infant while taking this medicine. Call your doctor or health care professional if you get diarrhea. Do not treat yourself. What side effects may I notice from receiving this medicine? Side effects that you should report to your doctor or health care professional as soon as possible: -allergic reactions like skin rash, itching or hives, swelling of the face, lips, or tongue -low blood counts - This drug may decrease the number of white blood cells, red blood cells and platelets. You may be at increased risk for infections and bleeding. -signs of infection - fever or chills, cough, sore throat, pain or difficulty passing urine -signs of decreased platelets or bleeding -  bruising, pinpoint red spots on the skin, black, tarry stools, nosebleeds -signs of decreased red blood cells - unusually weak or tired, fainting spells, lightheadedness -breathing problems -chest pain, pressure -cough -diarrhea -jaw tightness -mouth sores -nausea and vomiting -pain, swelling, redness or irritation at the injection site -pain, tingling, numbness in the hands or feet -problems with balance, talking, walking -redness, blistering, peeling or loosening of the skin, including inside the mouth -trouble passing urine or change in the amount of urine Side effects that usually do not require medical attention (report to your doctor or health care professional if they continue or are bothersome): -changes in vision -constipation -hair loss -loss of appetite -metallic taste in the mouth or changes in taste -stomach pain This list may not describe all possible side effects. Call your doctor for medical advice about side effects. You may report side effects to FDA at 1-800-FDA-1088. Where should I keep my medicine? This drug is given in a hospital or clinic and will not be stored at home. NOTE: This sheet is a summary. It may not cover all possible information. If you have questions about this medicine, talk to your doctor, pharmacist, or health care provider.  2017 [employer] Standard (2008-02-28 17:22:47) Leucovorin injection What is this medicine? LEUCOVORIN (loo koe VOR in) is used to prevent or treat the harmful effects of some medicines. This medicine is used to treat anemia   caused by a low amount of folic acid in the body. It is also used with 5-fluorouracil (5-FU) to treat colon cancer. This medicine may be used for other purposes; ask your health care provider or pharmacist if you have questions. What should I tell my health care provider before I take this medicine? They need to know if you have any of these conditions: -anemia from low levels of vitamin B-12 in the  blood -an unusual or allergic reaction to leucovorin, folic acid, other medicines, foods, dyes, or preservatives -pregnant or trying to get pregnant -breast-feeding How should I use this medicine? This medicine is for injection into a muscle or into a vein. It is given by a health care professional in a hospital or clinic setting. Talk to your pediatrician regarding the use of this medicine in children. Special care may be needed. Overdosage: If you think you have taken too much of this medicine contact a poison control center or emergency room at once. NOTE: This medicine is only for you. Do not share this medicine with others. What if I miss a dose? This does not apply. What may interact with this medicine? -capecitabine -fluorouracil -phenobarbital -phenytoin -primidone -trimethoprim-sulfamethoxazole This list may not describe all possible interactions. Give your health care provider a list of all the medicines, herbs, non-prescription drugs, or dietary supplements you use. Also tell them if you smoke, drink alcohol, or use illegal drugs. Some items may interact with your medicine. What should I watch for while using this medicine? Your condition will be monitored carefully while you are receiving this medicine. This medicine may increase the side effects of 5-fluorouracil, 5-FU. Tell your doctor or health care professional if you have diarrhea or mouth sores that do not get better or that get worse. What side effects may I notice from receiving this medicine? Side effects that you should report to your doctor or health care professional as soon as possible: -allergic reactions like skin rash, itching or hives, swelling of the face, lips, or tongue -breathing problems -fever, infection -mouth sores -unusual bleeding or bruising -unusually weak or tired Side effects that usually do not require medical attention (report to your doctor or health care professional if they continue or are  bothersome): -constipation or diarrhea -loss of appetite -nausea, vomiting This list may not describe all possible side effects. Call your doctor for medical advice about side effects. You may report side effects to FDA at 1-800-FDA-1088. Where should I keep my medicine? This drug is given in a hospital or clinic and will not be stored at home. NOTE: This sheet is a summary. It may not cover all possible information. If you have questions about this medicine, talk to your doctor, pharmacist, or health care provider.  2017 [employer] Standard (2008-02-07 16:50:29) Fluorouracil, 5-FU injection What is this medicine? FLUOROURACIL, 5-FU (flure oh YOOR a sil) is a chemotherapy drug. It slows the growth of cancer cells. This medicine is used to treat many types of cancer like breast cancer, colon or rectal cancer, pancreatic cancer, and stomach cancer. This medicine may be used for other purposes; ask your health care provider or pharmacist if you have questions. COMMON BRAND NAME(S): Adrucil What should I tell my health care provider before I take this medicine? They need to know if you have any of these conditions: -blood disorders -dihydropyrimidine dehydrogenase (DPD) deficiency -infection (especially a virus infection such as chickenpox, cold sores, or herpes) -kidney disease -liver disease -malnourished, poor nutrition -recent or ongoing radiation   ongoing radiation therapy -an unusual or allergic reaction to fluorouracil, other chemotherapy, other medicines, foods, dyes, or preservatives -pregnant or trying to get pregnant -breast-feeding How should I use this medicine? This drug is given as an infusion or injection into a vein. It is administered in a hospital or clinic by a specially trained health care professional. Talk to your pediatrician regarding the use of this medicine in children. Special care may be needed. Overdosage: If you think you have taken too much of this medicine contact a  poison control center or emergency room at once. NOTE: This medicine is only for you. Do not share this medicine with others. What if I miss a dose? It is important not to miss your dose. Call your doctor or health care professional if you are unable to keep an appointment. What may interact with this medicine? -allopurinol -cimetidine -dapsone -digoxin -hydroxyurea -leucovorin -levamisole -medicines for seizures like ethotoin, fosphenytoin, phenytoin -medicines to increase blood counts like filgrastim, pegfilgrastim, sargramostim -medicines that treat or prevent blood clots like warfarin, enoxaparin, and dalteparin -methotrexate -metronidazole -pyrimethamine -some other chemotherapy drugs like busulfan, cisplatin, estramustine, vinblastine -trimethoprim -trimetrexate -vaccines Talk to your doctor or health care professional before taking any of these medicines: -acetaminophen -aspirin -ibuprofen -ketoprofen -naproxen This list may not describe all possible interactions. Give your health care provider a list of all the medicines, herbs, non-prescription drugs, or dietary supplements you use. Also tell them if you smoke, drink alcohol, or use illegal drugs. Some items may interact with your medicine. What should I watch for while using this medicine? Visit your doctor for checks on your progress. This drug may make you feel generally unwell. This is not uncommon, as chemotherapy can affect healthy cells as well as cancer cells. Report any side effects. Continue your course of treatment even though you feel ill unless your doctor tells you to stop. In some cases, you may be given additional medicines to help with side effects. Follow all directions for their use. Call your doctor or health care professional for advice if you get a fever, chills or sore throat, or other symptoms of a cold or flu. Do not treat yourself. This drug decreases your body's ability to fight infections. Try to  avoid being around people who are sick. This medicine may increase your risk to bruise or bleed. Call your doctor or health care professional if you notice any unusual bleeding. Be careful brushing and flossing your teeth or using a toothpick because you may get an infection or bleed more easily. If you have any dental work done, tell your dentist you are receiving this medicine. Avoid taking products that contain aspirin, acetaminophen, ibuprofen, naproxen, or ketoprofen unless instructed by your doctor. These medicines may hide a fever. Do not become pregnant while taking this medicine. Women should inform their doctor if they wish to become pregnant or think they might be pregnant. There is a potential for serious side effects to an unborn child. Talk to your health care professional or pharmacist for more information. Do not breast-feed an infant while taking this medicine. Men should inform their doctor if they wish to father a child. This medicine may lower sperm counts. Do not treat diarrhea with over the counter products. Contact your doctor if you have diarrhea that lasts more than 2 days or if it is severe and watery. This medicine can make you more sensitive to the sun. Keep out of the sun. If you cannot avoid being in the sun,   clothing and use sunscreen. Do not use sun lamps or tanning beds/booths. What side effects may I notice from receiving this medicine? Side effects that you should report to your doctor or health care professional as soon as possible: -allergic reactions like skin rash, itching or hives, swelling of the face, lips, or tongue -low blood counts - this medicine may decrease the number of white blood cells, red blood cells and platelets. You may be at increased risk for infections and bleeding. -signs of infection - fever or chills, cough, sore throat, pain or difficulty passing urine -signs of decreased platelets or bleeding - bruising, pinpoint red spots on the  skin, black, tarry stools, blood in the urine -signs of decreased red blood cells - unusually weak or tired, fainting spells, lightheadedness -breathing problems -changes in vision -chest pain -mouth sores -nausea and vomiting -pain, swelling, redness at site where injected -pain, tingling, numbness in the hands or feet -redness, swelling, or sores on hands or feet -stomach pain -unusual bleeding Side effects that usually do not require medical attention (report to your doctor or health care professional if they continue or are bothersome): -changes in finger or toe nails -diarrhea -dry or itchy skin -hair loss -headache -loss of appetite -sensitivity of eyes to the light -stomach upset -unusually teary eyes This list may not describe all possible side effects. Call your doctor for medical advice about side effects. You may report side effects to FDA at 1-800-FDA-1088. Where should I keep my medicine? This drug is given in a hospital or clinic and will not be stored at home. NOTE: This sheet is a summary. It may not cover all possible information. If you have questions about this medicine, talk to your doctor, pharmacist, or health care provider.  2017 [employer] Standard (2007-12-07 13:53:16)  

## 2016-07-31 ENCOUNTER — Ambulatory Visit (HOSPITAL_BASED_OUTPATIENT_CLINIC_OR_DEPARTMENT_OTHER): Payer: 59

## 2016-07-31 VITALS — BP 125/71 | HR 76 | Temp 97.9°F | Resp 18

## 2016-07-31 DIAGNOSIS — C182 Malignant neoplasm of ascending colon: Secondary | ICD-10-CM | POA: Diagnosis not present

## 2016-07-31 DIAGNOSIS — C189 Malignant neoplasm of colon, unspecified: Secondary | ICD-10-CM

## 2016-07-31 DIAGNOSIS — Z452 Encounter for adjustment and management of vascular access device: Secondary | ICD-10-CM | POA: Diagnosis not present

## 2016-07-31 DIAGNOSIS — C787 Secondary malignant neoplasm of liver and intrahepatic bile duct: Principal | ICD-10-CM

## 2016-07-31 MED ORDER — HEPARIN SOD (PORK) LOCK FLUSH 100 UNIT/ML IV SOLN
500.0000 [IU] | Freq: Once | INTRAVENOUS | Status: AC | PRN
  Administered 2016-07-31: 500 [IU]
  Filled 2016-07-31: qty 5

## 2016-07-31 MED ORDER — SODIUM CHLORIDE 0.9% FLUSH
10.0000 mL | INTRAVENOUS | Status: DC | PRN
  Administered 2016-07-31: 10 mL
  Filled 2016-07-31: qty 10

## 2016-07-31 NOTE — Patient Instructions (Signed)
Ventricular Assist Device, Home Care It is important to follow home care instructions to care for your ventricular assist device (VAD). Going home (destination therapy) with a VAD is becoming more common for people who:  Are not candidates for heart transplantation and need a VAD.  Have heart failure and need time to let the heart rest.  Are awaiting a heart transplant. PARTS OF A VAD A VAD consists of the following parts:  Inflow tube. This tube carries oxygenated blood from the left ventricle to the VAD pump.  Outflow tube. This tube carries blood from the VAD pump to the aorta. The aorta is a large artery that carries oxygenated blood from your heart to the rest of your body.  Pump. The VAD pump is implanted inside the body.  Drive line (percutaneous lead). This tube connects the pump to the system controller.  System controller. The controller consists of batteries and electronics which control the VAD. The VAD is powered by either batteries or an electrical wall outlet.  INSPECTION OF THE VAD  Inspect the VAD tubing exit site once a day or as told by your caregiver. If there is redness, swelling, drainage, warmth, or a foul odor at the VAD tubing exit site, call your caregiver or VAD coordinator.  Record the following on your VAD flow sheet daily.  Your temperature.  Your blood pressure.  Your weight.  The VAD speed (rpm).  The VAD pulsatility index (PI).  The VAD power.  The VAD flow (L/min). VAD BANDAGE CHANGES Change your VAD bandage (dressing) as directed by your caregiver. You will be taught how to change the dressing at the hospital before you go home. A VAD dressing change requires the removal of the current dressing, the replacement of a dressing, and the securement of the VAD tubing.  Remove the current dressing. Use an unsterile (clean) technique to remove the current dressing. Follow the steps below to remove the current VAD dressing: 1. Wear a  mask. 2. Wash your hands. 3. Put on unsterile gloves. 4. Remove the current dressing. 5. Remove your gloves. Wash your hands again. 6. Do not touch the tubing at the VAD tubing exit site after the dressing has been taken off.  Replace the VAD dressing. Use a germ-free (sterile) technique. Follow the steps below to replace the VAD dressing. 1. Continue wearing the mask. 2. Set out the supplies using sterile technique. 3. Put on sterile gloves. 4. Clean the VAD exit site using the product your caregiver has instructed you to use. 5. Gently dry the exit site with dry sterile gauze. 6. Apply a slit gauze dressing (drain sponge) to the VAD tubing exit site. Cover with a 4-inch square (10 cm square) gauze pad. 7. Secure the dressing with tape or specified product as directed by your caregiver.  Secure the VAD tubing as directed by your caregiver. The VAD tubing should be secured to prevent:  Damage to the VAD tubing.  Irritation at the VAD tubing exit site.  Infection. LIVING WITH A VAD  Become familiar with your VAD. Be sure you:  Know all parts and functions of the VAD.  Understand the VAD alarm functions.  Are comfortable doing sterile dressing changes.  Know what to do in case of an emergency.  When leaving home, make sure you take:  A spare system controller.  Extra VAD batteries.  Do not smoke. Avoid places where you will be exposed to secondhand smoke. Smoking and secondhand smoke cause your arteries to  tighten. This decreases blood flow and will make your pump work harder.  Eat healthy foods. Eat small, frequent meals.  Ask your caregiver about the use of vitamins and mineral supplements.  Talk to your caregiver about drinking alcohol.  Avoid being active in very hot or cold weather.  Balance activity with periods of rest.  Resume sexual activity as advised by your caregiver.  Avoid touching televisions or computer screens. This is a safety precaution to avoid  static electrical shocks. Use an antistatic spray as needed.  Do not vacuum.  Schedule and attend all follow-up appointments as directed by your caregiver. It is important to keep all your appointments.  Schedule and attend all follow-up laboratory appointments as directed by your caregiver. While taking anticoagulants, you will need to have frequent blood samples to measure the clotting time of your blood.  Participate in cardiac rehabilitation as directed by your caregiver.  Obtain ongoing support.  If you require ambulance or emergency services, a trained caregiver should stay with you at all times.  Chest compressions should not be performed when you have a VAD. RESTRICTIONS WITH A VAD It is important to follow your caregiver's instructions on what you can and cannot do regarding activities. Guidelines for restricted activities include:  Do not expose the VAD to water. This can cause VAD pump failure.  Do not take baths or go swimming.  You may take showers as directed by your caregiver. You must use a shower bag to cover the VAD equipment. Your caregiver will show you how to cover the VAD parts. Do not get the connection between the percutaneous lead and the system controller wet.  Do not play contact or physically challenging sports.  Do not jump up and down excessively.  Do not lift more than 10 lb (4.5 kg) for the first 6 weeks or as directed by your caregiver.  Do not sleep on your stomach.  Do not drive or operate heavy machinery. TRAVELING WITH A VAD  Notify your VAD coordinator before you travel and let him or her know:  Your travel dates.  Your length of travel.  Identify the closest VAD centers to your travel destinations.  Notify those centers of the dates you will be in the area.  Carry names and telephone numbers of emergency contacts.  Get permission from your caregiver before you travel by air or train. You will need to notify airline or train  security that you have a VAD. To pass through security:  You will need a letter from your caregiver explaining your medical condition and VAD equipment.  Do not go through the X-ray equipment. Security may need to inspect the external VAD parts. CALL YOUR HEART FAILURE SPECIALIST OR VAD COORDINATOR IF:  The VAD flow rate, PI, and power are not at rates as instructed by your caregiver.  You have swelling in your ankles or feet. This may be a sign of water retention.  You gain 3 pounds (1.36 kg) or more in 1 day. Or, follow your caregiver's advice regarding weight gain.  You have any type of chest pain.  You have redness, pain, warmth, foul odor, or drainage at the VAD tube exit site.  You notice changes in how the VAD feels or works.  You have any VAD concerns.  You have persistent bleeding.  You have an increase in bruising.  You have abdominal pain.  You have nausea or vomiting.  You have a fever. CALL YOUR LOCAL EMERGENCY SERVICE AND VAD  COORDINATOR Call your local emergency service (911 in [place] and VAD coordinator immediately if the pump is not functioning properly. Pump malfunctioning includes:  Loss of power to the pump.  Any unexplained or unusual alarms.  Broken wires.  Damage to the pump motor or system controller.  Red heart alarm. This is an alarm that signals VAD pump failure. SEEK IMMEDIATE MEDICAL CARE IF:  You have shortness of breath or difficulty breathing.  You have chest pain, or pain that spreads to your arm, neck, jaw, back, or abdomen.  You have blood in your sputum, vomit, or stool. This information is not intended to replace advice given to you by your health care provider. Make sure you discuss any questions you have with your health care provider. Document Released: 10/28/2009 Document Revised: 08/24/2014 Document Reviewed: 06/17/2015 Elsevier Interactive Patient Education  2017 [employer].

## 2016-08-05 ENCOUNTER — Encounter: Payer: Self-pay | Admitting: Hematology

## 2016-08-05 ENCOUNTER — Other Ambulatory Visit (HOSPITAL_BASED_OUTPATIENT_CLINIC_OR_DEPARTMENT_OTHER): Payer: 59

## 2016-08-05 ENCOUNTER — Ambulatory Visit (HOSPITAL_BASED_OUTPATIENT_CLINIC_OR_DEPARTMENT_OTHER): Payer: 59 | Admitting: Hematology

## 2016-08-05 ENCOUNTER — Other Ambulatory Visit: Payer: Self-pay | Admitting: *Deleted

## 2016-08-05 VITALS — BP 114/67 | HR 82 | Temp 98.8°F | Resp 18 | Wt 246.1 lb

## 2016-08-05 DIAGNOSIS — C787 Secondary malignant neoplasm of liver and intrahepatic bile duct: Secondary | ICD-10-CM

## 2016-08-05 DIAGNOSIS — C189 Malignant neoplasm of colon, unspecified: Secondary | ICD-10-CM

## 2016-08-05 DIAGNOSIS — C182 Malignant neoplasm of ascending colon: Secondary | ICD-10-CM

## 2016-08-05 DIAGNOSIS — F5101 Primary insomnia: Secondary | ICD-10-CM | POA: Diagnosis not present

## 2016-08-05 DIAGNOSIS — D500 Iron deficiency anemia secondary to blood loss (chronic): Secondary | ICD-10-CM | POA: Diagnosis not present

## 2016-08-05 DIAGNOSIS — K922 Gastrointestinal hemorrhage, unspecified: Secondary | ICD-10-CM

## 2016-08-05 LAB — CBC & DIFF AND RETIC
BASO%: 0.6 % (ref 0.0–2.0)
Basophils Absolute: 0 10*3/uL (ref 0.0–0.1)
EOS ABS: 0.8 10*3/uL — AB (ref 0.0–0.5)
EOS%: 14.9 % — AB (ref 0.0–7.0)
HCT: 37.7 % — ABNORMAL LOW (ref 38.4–49.9)
HGB: 12.4 g/dL — ABNORMAL LOW (ref 13.0–17.1)
Immature Retic Fract: 2.5 % — ABNORMAL LOW (ref 3.00–10.60)
LYMPH#: 2 10*3/uL (ref 0.9–3.3)
LYMPH%: 39.2 % (ref 14.0–49.0)
MCH: 25.7 pg — AB (ref 27.2–33.4)
MCHC: 32.9 g/dL (ref 32.0–36.0)
MCV: 78.2 fL — ABNORMAL LOW (ref 79.3–98.0)
MONO#: 0.5 10*3/uL (ref 0.1–0.9)
MONO%: 9.4 % (ref 0.0–14.0)
NEUT%: 35.9 % — ABNORMAL LOW (ref 39.0–75.0)
NEUTROS ABS: 1.8 10*3/uL (ref 1.5–6.5)
Platelets: 272 10*3/uL (ref 140–400)
RBC: 4.82 10*6/uL (ref 4.20–5.82)
RDW: 21 % — AB (ref 11.0–14.6)
RETIC %: 0.39 % — AB (ref 0.80–1.80)
Retic Ct Abs: 18.8 10*3/uL — ABNORMAL LOW (ref 34.80–93.90)
WBC: 5.1 10*3/uL (ref 4.0–10.3)

## 2016-08-05 LAB — COMPREHENSIVE METABOLIC PANEL
ALT: 21 U/L (ref 0–55)
AST: 15 U/L (ref 5–34)
Albumin: 3.9 g/dL (ref 3.5–5.0)
Alkaline Phosphatase: 86 U/L (ref 40–150)
Anion Gap: 10 mEq/L (ref 3–11)
BUN: 15.5 mg/dL (ref 7.0–26.0)
CHLORIDE: 106 meq/L (ref 98–109)
CO2: 22 meq/L (ref 22–29)
Calcium: 8.9 mg/dL (ref 8.4–10.4)
Creatinine: 0.9 mg/dL (ref 0.7–1.3)
EGFR: >90 mL/min/{1.73_m2} (ref >90)
GLUCOSE: 108 mg/dL (ref 70–140)
POTASSIUM: 3.8 meq/L (ref 3.5–5.1)
SODIUM: 138 meq/L (ref 136–145)
Total Bilirubin: 0.33 mg/dL (ref 0.20–1.20)
Total Protein: 6.9 g/dL (ref 6.4–8.3)

## 2016-08-05 MED ORDER — LORAZEPAM 0.5 MG PO TABS: 0.5000 mg | ORAL_TABLET | Freq: Three times a day (TID) | ORAL | 0 refills | Status: DC

## 2016-08-12 ENCOUNTER — Telehealth: Payer: Self-pay | Admitting: Hematology

## 2016-08-12 ENCOUNTER — Ambulatory Visit (HOSPITAL_BASED_OUTPATIENT_CLINIC_OR_DEPARTMENT_OTHER): Payer: 59

## 2016-08-12 ENCOUNTER — Telehealth: Payer: Self-pay | Admitting: *Deleted

## 2016-08-12 ENCOUNTER — Other Ambulatory Visit (HOSPITAL_BASED_OUTPATIENT_CLINIC_OR_DEPARTMENT_OTHER): Payer: 59

## 2016-08-12 VITALS — BP 122/82 | HR 79 | Temp 97.6°F | Resp 18

## 2016-08-12 DIAGNOSIS — C182 Malignant neoplasm of ascending colon: Secondary | ICD-10-CM | POA: Diagnosis not present

## 2016-08-12 DIAGNOSIS — Z5111 Encounter for antineoplastic chemotherapy: Secondary | ICD-10-CM | POA: Diagnosis not present

## 2016-08-12 DIAGNOSIS — D500 Iron deficiency anemia secondary to blood loss (chronic): Secondary | ICD-10-CM | POA: Diagnosis not present

## 2016-08-12 DIAGNOSIS — C189 Malignant neoplasm of colon, unspecified: Secondary | ICD-10-CM

## 2016-08-12 DIAGNOSIS — C787 Secondary malignant neoplasm of liver and intrahepatic bile duct: Secondary | ICD-10-CM | POA: Diagnosis not present

## 2016-08-12 LAB — IRON AND TIBC
%SAT: 59 % — ABNORMAL HIGH (ref 20–55)
Iron: 269 ug/dL — ABNORMAL HIGH (ref 42–163)
TIBC: 454 ug/dL — ABNORMAL HIGH (ref 202–409)
UIBC: 185 ug/dL (ref 117–376)

## 2016-08-12 LAB — CBC & DIFF AND RETIC
BASO%: 0.5 % (ref 0.0–2.0)
BASOS ABS: 0 10*3/uL (ref 0.0–0.1)
EOS ABS: 0.3 10*3/uL (ref 0.0–0.5)
EOS%: 4.2 % (ref 0.0–7.0)
HEMATOCRIT: 39.4 % (ref 38.4–49.9)
HEMOGLOBIN: 12.7 g/dL — AB (ref 13.0–17.1)
Immature Retic Fract: 4.6 % (ref 3.00–10.60)
LYMPH#: 1.7 10*3/uL (ref 0.9–3.3)
LYMPH%: 28.7 % (ref 14.0–49.0)
MCH: 26 pg — ABNORMAL LOW (ref 27.2–33.4)
MCHC: 32.2 g/dL (ref 32.0–36.0)
MCV: 80.6 fL (ref 79.3–98.0)
MONO#: 1 10*3/uL — ABNORMAL HIGH (ref 0.1–0.9)
MONO%: 16.7 % — ABNORMAL HIGH (ref 0.0–14.0)
NEUT%: 49.9 % (ref 39.0–75.0)
NEUTROS ABS: 3 10*3/uL (ref 1.5–6.5)
Platelets: 161 10*3/uL (ref 140–400)
RBC: 4.89 10*6/uL (ref 4.20–5.82)
RDW: 20.9 % — AB (ref 11.0–14.6)
RETIC %: 1.66 % (ref 0.80–1.80)
RETIC CT ABS: 81.17 10*3/uL (ref 34.80–93.90)
WBC: 5.9 10*3/uL (ref 4.0–10.3)

## 2016-08-12 LAB — COMPREHENSIVE METABOLIC PANEL
ALT: 56 U/L — AB (ref 0–55)
AST: 38 U/L — AB (ref 5–34)
Albumin: 3.7 g/dL (ref 3.5–5.0)
Alkaline Phosphatase: 91 U/L (ref 40–150)
Anion Gap: 9 mEq/L (ref 3–11)
BUN: 14.3 mg/dL (ref 7.0–26.0)
CALCIUM: 9.3 mg/dL (ref 8.4–10.4)
CHLORIDE: 105 meq/L (ref 98–109)
CO2: 25 mEq/L (ref 22–29)
CREATININE: 1 mg/dL (ref 0.7–1.3)
EGFR: >90 mL/min/{1.73_m2} (ref >90)
GLUCOSE: 107 mg/dL (ref 70–140)
Potassium: 4.4 mEq/L (ref 3.5–5.1)
SODIUM: 139 meq/L (ref 136–145)
Total Bilirubin: 0.32 mg/dL (ref 0.20–1.20)
Total Protein: 7 g/dL (ref 6.4–8.3)

## 2016-08-12 LAB — FERRITIN: Ferritin: 44 ng/mL (ref 22–316)

## 2016-08-12 LAB — CHCC SMEAR

## 2016-08-12 MED ORDER — PALONOSETRON HCL INJECTION 0.25 MG/5ML
0.2500 mg | Freq: Once | INTRAVENOUS | Status: AC
  Administered 2016-08-12: 0.25 mg via INTRAVENOUS

## 2016-08-12 MED ORDER — FLUOROURACIL CHEMO INJECTION 2.5 GM/50ML
400.0000 mg/m2 | Freq: Once | INTRAVENOUS | Status: AC
  Administered 2016-08-12: 950 mg via INTRAVENOUS
  Filled 2016-08-12: qty 19

## 2016-08-12 MED ORDER — DEXTROSE 5 % IV SOLN
400.0000 mg/m2 | Freq: Once | INTRAVENOUS | Status: AC
  Administered 2016-08-12: 964 mg via INTRAVENOUS
  Filled 2016-08-12: qty 48.2

## 2016-08-12 MED ORDER — DEXTROSE 5 % IV SOLN
84.0000 mg/m2 | Freq: Once | INTRAVENOUS | Status: AC
  Administered 2016-08-12: 200 mg via INTRAVENOUS
  Filled 2016-08-12: qty 40

## 2016-08-12 MED ORDER — FLUOROURACIL CHEMO INJECTION 5 GM/100ML
2400.0000 mg/m2 | INTRAVENOUS | Status: DC
  Administered 2016-08-12: 5800 mg via INTRAVENOUS
  Filled 2016-08-12: qty 116

## 2016-08-12 MED ORDER — DEXAMETHASONE SODIUM PHOSPHATE 10 MG/ML IJ SOLN
10.0000 mg | Freq: Once | INTRAMUSCULAR | Status: AC
  Administered 2016-08-12: 10 mg via INTRAVENOUS

## 2016-08-12 MED ORDER — PALONOSETRON HCL INJECTION 0.25 MG/5ML
INTRAVENOUS | Status: AC
  Filled 2016-08-12: qty 5

## 2016-08-12 MED ORDER — DEXAMETHASONE SODIUM PHOSPHATE 10 MG/ML IJ SOLN
INTRAMUSCULAR | Status: AC
  Filled 2016-08-12: qty 1

## 2016-08-12 MED ORDER — DEXTROSE 5 % IV SOLN
Freq: Once | INTRAVENOUS | Status: AC
  Administered 2016-08-12: 11:00:00 via INTRAVENOUS

## 2016-08-12 NOTE — Telephone Encounter (Signed)
Appointments scheduled per 12/20 LOS. Patient given AVS report and calendars with future scheduled appointments. On 12/20 patient did not stop by scheduling to schedule appointments, came by today.

## 2016-08-12 NOTE — Patient Instructions (Signed)
[facility] Discharge Instructions for Patients Receiving Chemotherapy  Today you received the following chemotherapy agents:  Oxaliplatin, Leucovorin, Fluorouracil  To help prevent nausea and vomiting after your treatment, we encourage you to take your nausea medication as prescribed.   If you develop nausea and vomiting that is not controlled by your nausea medication, call the clinic.   BELOW ARE SYMPTOMS THAT SHOULD BE REPORTED IMMEDIATELY:  *FEVER GREATER THAN 100.5 F  *CHILLS WITH OR WITHOUT FEVER  NAUSEA AND VOMITING THAT IS NOT CONTROLLED WITH YOUR NAUSEA MEDICATION  *UNUSUAL SHORTNESS OF BREATH  *UNUSUAL BRUISING OR BLEEDING  TENDERNESS IN MOUTH AND THROAT WITH OR WITHOUT PRESENCE OF ULCERS  *URINARY PROBLEMS  *BOWEL PROBLEMS  UNUSUAL RASH Items with * indicate a potential emergency and should be followed up as soon as possible.  Feel free to call the clinic you have any questions or concerns. The clinic phone number is [phone].  Please show the CHEMO ALERT CARD at check-in to the Emergency Department and triage nurse.   

## 2016-08-12 NOTE — Telephone Encounter (Signed)
per LOS I have scheduled appt and gave calendar

## 2016-08-13 NOTE — Progress Notes (Signed)
[name]    HEMATOLOGY/ONCOLOGY CLINIC NOTE  Date of Service: .08/05/2016  Patient Care Team: [name], MD as PCP - General (Family Medicine) [name] M.D. (general surgery)  CHIEF COMPLAINTS/PURPOSE OF CONSULTATION:  Post hospitalization followup for colon cancer  HISTORY OF PRESENTING ILLNESS:   Scott Gallagher is a wonderful 45 y.o. male who has been referred to [place] by Dr .[name], MD for evaluation and management of newly diagnosed colon cancer likely with metastases to the liver.  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 He noted some fatigue but no other acute symptoms or overt GI bleeding.  Patient required transfusion of 2 units of PRBCs and IV iron. He subsequently had a colonoscopy which showed a near obstructing mass of his ascending colon close to the cecum which appeared concerning for colon cancer.Biopsy showed atleast high grade adenomatous dysplasia. CT chest /abd/pelvis was done on 06/08/2016 and showed 3.7 cm in length area of narrowing of the ascending colon, which lies 2 cm above the ileocecal junction, initially felt to likely reflect an area of spasm, but consistent with a constricting lesion. There are 2 adjacent sub cm shotty mesenteric lymph nodes, the largest measuring 9 mm in short axis.12 mm low-density lesion in the anterior segment of the right lobe. Liver otherwise unremarkable.  Patient underwent laparoscopic assisted rt hemicolectomy on 06/17/2016 , liver was surveyed and an umbilicated nodule was noted on the rt lobe of liver and a biopsy of this was done that revealed adenocarcinoma on frozen section.  Was seen in the hospital and was referred to continue follow-up with Dr. Marin Olp but chooses to continue  follow-up here since it is close to where he lives and that it would be more convenient to him.   We discussed his pathology results and details. We discussed the need for a PET/CT scan towards the end of the month to have an accurate baseline staging and more specifically to evaluate residual disease in his liver and other sites since this will determine whether he has isolated oligo metastatic disease in the liver or if it is more widespread.  We discussed the rationale, attention adverse effects, advantages, alternatives to FOLFOX chemotherapy. We discussed the role of port placement. We discussed the specifics of chemotherapy and all his questions were answered in great details.   He is currently healing very well after surgery with incisions having close up nicely. Mild postoperative discomfort but good bowel movements and no other acute issues. No evidence of rectal bleeding.  INTERVAL HISTORY  Patient is here for follow-up prior to his 3rd cycle of FOLFOX . Patient notes minimal grade 1 fatigue and grade 1 diarrhea. No other acute new symptoms. We discussed the MRI abd results showing multiple liver mets that I discussed in addition to the peritoneal deposits might be unresectable but that we will await input from his surgeon as well. He has f/u with Dr Hassell Done on 08/21/2015.  No evidence of GI bleeding. MIldly loose stool 2-3 times daily - soft not watery. No mucous or blood in the stools.  MEDICAL HISTORY:  Past Medical History:  Diagnosis Date  . Anemia   . Arthritis   . Cancer ([facility])   . Diabetes mellitus without complication ([place])  diet controlled  . History of blood transfusion   . Hyperlipidemia   . Sleep apnea    cpap  . Wears glasses     SURGICAL HISTORY: Past Surgical History:  Procedure Laterality Date  . COLON SURGERY    . LAPAROSCOPIC RIGHT HEMI COLECTOMY Right 06/17/2016   Procedure: LAPAROSCOPIC ASSISTED  RIGHT HEMI COLECTOMY;  Surgeon: [name], MD;   Location: [facility];  Service: General;  Laterality: Right;  . PORTACATH PLACEMENT Left 07/13/2016   Procedure: INSERTION PORT-A-CATH left subclavian;  Surgeon: [name], MD;  Location: [facility];  Service: General;  Laterality: Left;  . SHOULDER ACROMIOPLASTY Right 12/20/2014   Procedure: SHOULDER ACROMIOPLASTY;  Surgeon: Melrose Nakayama, MD;  Location: [place];  Service: Orthopedics;  Laterality: Right;  . SHOULDER ARTHROSCOPY Right 12/20/2014   Procedure: RIGHT ARTHROSCOPY SHOULDER WITH DEBRIDEMENT;  Surgeon: Melrose Nakayama, MD;  Location: [place];  Service: Orthopedics;  Laterality: Right;  . TESTICLE SURGERY     as teen  . TONSILLECTOMY    . WISDOM TOOTH EXTRACTION      SOCIAL HISTORY: Social History   Social History  . Marital status: Married    Spouse name: N/A  . Number of children: N/A  . Years of education: N/A   Occupational History  . cemetery maintenance    Social History Main Topics  . Smoking status: Former Smoker    Packs/day: 1.50    Years: 29.00    Types: Cigarettes    Quit date: 12/16/2012  . Smokeless tobacco: Never Used     Comment: 1-2 ppd from age [age]y to 2014  . Alcohol use 1.8 oz/week    3 Shots of liquor per week     Comment:  some days  . Drug use: No  . Sexual activity: Not on file   Other Topics Concern  . Not on file   Social History Narrative  . No narrative on file    FAMILY HISTORY: Family History  Problem Relation Age of Onset  . Diabetes Other   . Hyperlipidemia Other   . Hypertension Other   . Breast cancer Maternal Aunt 70  . Prostate cancer Maternal Uncle 75  . Lung cancer Paternal Uncle 11  . Stroke Maternal Grandfather 72  . Cancer Paternal Grandmother 97    dx cancer of pancreas and colon, unknown if separate primaries  . Heart Problems Paternal Grandfather     d. 38  . Prostate cancer Maternal Uncle 36  . Breast cancer Maternal Aunt 75  . Breast cancer Maternal Aunt 68  . Cervical  cancer Maternal Aunt 68  . Pancreatic cancer Maternal Aunt 54    d. 58y; heavy smoker  . Colon cancer Paternal Uncle 80    s/p partial colectomy; dx. second colon cancer at age 63-64    ALLERGIES:  is allergic to penicillins.  MEDICATIONS:  Current Outpatient Prescriptions  Medication Sig Dispense Refill  . dexamethasone (DECADRON) 4 MG tablet Take 2 tablets (8 mg total) by mouth daily. Start the day after chemotherapy for 2 days. Take with food. 30 tablet 1  . lidocaine-prilocaine (EMLA) cream Apply to affected area once 30 g 3  . LORazepam (ATIVAN) 0.5 MG tablet Take 1 tablet (0.5 mg total) by mouth every 8 (eight) hours. For anxiety or sleep 30 tablet 0  . Multiple Vitamin (MULTIVITAMIN) tablet Take 1 tablet by mouth daily.    . ondansetron (ZOFRAN) 8 MG tablet Take 1 tablet (8 mg  total) by mouth 2 (two) times daily as needed for refractory nausea / vomiting. Start on day 3 after chemotherapy. 30 tablet 1  . oxyCODONE-acetaminophen (PERCOCET/ROXICET) 5-325 MG tablet Take 1-2 tablets by mouth every 4 (four) hours as needed for moderate pain. 30 tablet 0  . Probiotic Product (PROBIOTIC PO) Take 1 tablet by mouth daily.    . prochlorperazine (COMPAZINE) 10 MG tablet Take 1 tablet (10 mg total) by mouth every 6 (six) hours as needed (Nausea or vomiting). 30 tablet 1   No current facility-administered medications for this visit.     REVIEW OF SYSTEMS:    10 Point review of Systems was done is negative except as noted above.  PHYSICAL EXAMINATION: ECOG PERFORMANCE STATUS: 1 - Symptomatic but completely ambulatory  . Vitals:   08/05/16 1235  BP: 114/67  Pulse: 82  Resp: 18  Temp: 98.8 F (37.1 C)   Filed Weights   08/05/16 1235  Weight: 246 lb 1.6 oz (111.6 kg)   .Body mass index is 31.6 kg/m. GENERAL:alert, in no acute distress and comfortable SKIN: skin color, texture, turgor are normal, no rashes or significant lesions EYES: normal, conjunctiva are pink and  non-injected, sclera clear OROPHARYNX:no exudate, no erythema and lips, buccal mucosa, and tongue normal  NECK: supple, no JVD, thyroid normal size, non-tender, without nodularity LYMPH:  no palpable lymphadenopathy in the cervical, axillary or inguinal LUNGS: clear to auscultation with normal respiratory effort HEART: regular rate & rhythm,  no murmurs and no lower extremity edema ABDOMEN: abdomen soft, bowel sounds normoactive, healing laparoscopic surgical incisions with no discharge look clean. Musculoskeletal: no cyanosis of digits and no clubbing  PSYCH: alert & oriented x 3 with fluent speech NEURO: no focal motor/sensory deficits   LABORATORY DATA:  I have reviewed the data as listed  . CBC Latest Ref Rng & Units 08/05/2016 07/29/2016  WBC 4.0 - 10.3 10e3/uL 5.1 6.1  Hemoglobin 13.0 - 17.1 g/dL 12.4(L) 12.4(L)  Hematocrit 38.4 - 49.9 % 37.7(L) 39.4  Platelets 140 - 400 10e3/uL 272 224    . CMP Latest Ref Rng & Units 08/05/2016 07/29/2016  Glucose 70 - 140 mg/dl 108 109  BUN 7.0 - 26.0 mg/dL 15.5 16.6  Creatinine 0.7 - 1.3 mg/dL 0.9 1.0  Sodium 136 - 145 mEq/L 138 142  Potassium 3.5 - 5.1 mEq/L 3.8 4.4  Chloride 101 - 111 mmol/L - -  CO2 22 - 29 mEq/L 22 24  Calcium 8.4 - 10.4 mg/dL 8.9 9.2  Total Protein 6.4 - 8.3 g/dL 6.9 7.2  Total Bilirubin 0.20 - 1.20 mg/dL 0.33 0.35  Alkaline Phos 40 - 150 U/L 86 101  AST 5 - 34 U/L 15 21  ALT 0 - 55 U/L 21 36      Microscopic Comment 2. COLON AND RECTUM (INCLUDING TRANS-ANAL RESECTION): Specimen: Terminal ileum, right colon and appendix Procedure: Segmental resection Tumor site: Proximal ascending colon Specimen integrity: Intact Macroscopic intactness of mesorectum: Not applicable: x Complete: NA Near complete: NA Incomplete: NA Cannot be determined (specify): NA Macroscopic tumor perforation: The mass invades through muscularis propria into pericolonic soft tissue Invasive tumor: Maximum size: 5.5  cm Histologic type(s): Adenocarcinoma Histologic grade and differentiation: G3 G1: well differentiated/low grade 1 of 4 Supplemental copy SUPPLEMENTAL for Mccree, Sedrick ([id number]) Microscopic Comment(continued) G2: moderately differentiated/low grade G3: poorly differentiated/high grade G4: undifferentiated/high grade Type of polyp in which invasive carcinoma arose: Tubular adenoma Microscopic extension of invasive tumor: The mass invade through the muscularis propria  into pericolonic soft tissue Lymph-Vascular invasion: Identified Peri-neural invasion: Identified Tumor deposit(s) (discontinuous extramural extension): Present Resection margins: Proximal margin: Negative Distal margin: Negative Circumferential (radial) (posterior ascending, posterior descending; lateral and posterior mid-rectum; and entire lower 1/3 rectum):Negative Mesenteric margin (sigmoid and transverse): NA Distance closest margin (if all above margins negative): 3.7 cm from the non peritonealized pericolic soft tissue margin Trans-anal resection margins only: Deep margin: NA Mucosal Margin: NA Distance closest mucosal margin (if negative): NA Treatment effect (neo-adjuvant therapy): NA Additional polyp(s): Negative Non-neoplastic findings: Unremarkable Lymph nodes: number examined 18; number positive: 5 Pathologic Staging: pT3, N2a, M1a Ancillary studies: MSI ordered   RADIOGRAPHIC STUDIES: I have personally reviewed the radiological images as listed and agreed with the findings in the report. Mr Abdomen W Wo Contrast  Result Date: 07/27/2016 CLINICAL DATA:  Colon carcinoma metastatic to liver. EXAM: MRI ABDOMEN WITHOUT AND WITH CONTRAST TECHNIQUE: Multiplanar multisequence MR imaging of the abdomen was performed both before and after the administration of intravenous contrast. CONTRAST:  71m MULTIHANCE GADOBENATE DIMEGLUMINE 529 MG/ML IV SOLN COMPARISON:  PET-CT on 07/15/2016 FINDINGS: Lower chest:  No acute findings. Hepatobiliary: 2.1 cm peripherally enhancing mass is seen at the junction of the right and left hepatic lobes, segments 8 and 4A on image 27/3 and 51/902. A 1.5 lesion is also seen in segment 8 on image 31/3 31/3 and 57/903. Both of these lesion showed hypermetabolic activity on recent PET are consistent with liver metastases. There also multiple other tiny sub-cm lesions throughout the right and left hepatic lobes which are best seen on T2 and DWI sequences, but are not well visualized on dynamic imaging. Multiple of these sub-cm lesions also showed hypermetabolic activity on recent PET-CT, and are consistent with liver metastases. Pancreas:  No mass or inflammatory changes. Spleen:  Within normal limits in size and appearance. Adrenals/Urinary Tract: No masses identified. Small bilateral renal cysts incidentally noted. No evidence of hydronephrosis. Stomach/Bowel: Visualized portions within the abdomen are unremarkable. Vascular/Lymphatic: No pathologically enlarged lymph nodes identified. No abdominal aortic aneurysm demonstrated. Other: 1.2 cm enhancing peritoneal nodule seen in the left paracolic gutter on image 823/536 This was also hypermetabolic on recent PET, and is suspicious for peritoneal metastasis. Musculoskeletal:  No suspicious bone lesions identified. IMPRESSION: Multiple small liver metastases throughout the right and left hepatic lobes, largest measuring 2.1 cm. 1.2 cm enhancing peritoneal nodule in left paracolic gutter, suspicious for peritoneal metastasis. Other small peritoneal nodules better visualized on recent PET-CT which showed hypermetabolic activity, also suspicious for peritoneal metastases. See prior PET-CT report. Electronically Signed   By: JEarle GellM.D.   On: 07/27/2016 10:25   Nm Pet Image Initial (pi) Skull Base To Thigh  Result Date: 07/15/2016 CLINICAL DATA:  Subsequent treatment strategy for colorectal carcinoma with liver metastasis. EXAM: NUCLEAR  MEDICINE PET SKULL BASE TO THIGH TECHNIQUE: 12.1 mCi F-18 FDG was injected intravenously. Full-ring PET imaging was performed from the skull base to thigh after the radiotracer. CT data was obtained and used for attenuation correction and anatomic localization. FASTING BLOOD GLUCOSE:  Value: 86 mg/dl COMPARISON:  CT abdomen 05/24/1999 17 FINDINGS: NECK No hypermetabolic lymph nodes in the neck. CHEST No hypermetabolic mediastinal or hilar nodes. No suspicious pulmonary nodules on the CT scan. ABDOMEN/PELVIS Interval RIGHT hemicolectomy. No hypermetabolic activity at the anastomosis. There several hypermetabolic foci within liver consistent metastasis. Largest lesion in the RIGHT hepatic lobe corresponds low-density lesions seen on comparison CT and has intense metabolic activity SUV max equal 30. There  approximately 4 small lesion identified including small lesion in the caudate lobe. Hypermetabolic nodule along the LEFT pericolic gutter measuring 9 mm (image 134, series 4). This activity is intense for size with SUV max 14. This nodule is increased slightly in the interval from 7 mm on comparison CT. Second very subtle hypermetabolic peritoneal nodule adjacent to small bowel of the RIGHT lower quadrant and just ventral to the posterior peritoneal reflection. Nodule is very small measuring 5 mm on image 152, series 4; however has intense metabolic activity for size with SUV max equal 15.3. SKELETON No focal hypermetabolic activity to suggest skeletal metastasis. IMPRESSION: 1. Multi foci hypermetabolic HEPATIC METASTASIS. 2. Two hypermetabolic peritoneal nodules along the dorsal peritoneal surface are concerning for peritoneal metastasis. One along the LEFT pericolic gutter and one anterior to the RIGHT psoas muscle. 3. Interval RIGHT hemicolectomy. Electronically Signed   By: Suzy Bouchard M.D.   On: 07/15/2016 17:08   CT CHEST, ABDOMEN, AND PELVIS WITH CONTRAST 05/23/2016  TECHNIQUE: Multidetector CT  imaging of the chest, abdomen and pelvis was performed following the standard protocol during bolus administration of intravenous contrast.  CONTRAST:  159m ISOVUE-300 IOPAMIDOL (ISOVUE-300) INJECTION 61%  COMPARISON:  None.  FINDINGS: CT CHEST FINDINGS  Cardiovascular: Heart is normal in size and configuration. No coronary artery calcifications. Great vessels normal in caliber. No aortic atherosclerosis.  Mediastinum/Nodes: No enlarged mediastinal, hilar, or axillary lymph nodes. Thyroid gland, trachea, and esophagus demonstrate no significant findings.  Lungs/Pleura: Minor subsegmental atelectasis in the right lower lobe. Lungs otherwise clear. No pleural effusion or pneumothorax.  Musculoskeletal: No chest wall abnormality. No acute or significant osseous findings.  CT ABDOMEN PELVIS FINDINGS  Hepatobiliary: 12 mm low-density lesion in the anterior segment of the right lobe. Liver otherwise unremarkable. Normal gallbladder. No bile duct dilation.  Pancreas: Unremarkable. No pancreatic ductal dilatation or surrounding inflammatory changes.  Spleen: Normal in size without focal abnormality.  Adrenals/Urinary Tract: No adrenal masses. Single low-density masses in each kidney measuring 13 mm, in the mid to lower pole on each side, consistent with cysts. No other renal masses, no stones and no hydronephrosis. Normal ureters. Normal bladder.  Stomach/Bowel: Stomach is within normal limits. Appendix appears normal. No evidence of bowel wall thickening, distention, or inflammatory changes.  Vascular/Lymphatic: Minor inferior aorta atherosclerotic calcifications. No aneurysm. No adenopathy.  Reproductive: Prostate is unremarkable.  Other: No abdominal wall hernia or abnormality. No abdominopelvic ascites.  Musculoskeletal: Unremarkable.  IMPRESSION: 1. No acute findings within the chest, abdomen or pelvis. 2. No findings to account for chronic  blood loss. 3. 12 mm low-density liver lesion. This is not as low in attenuation as would be expected for a cyst. The lesion is nonspecific. This could be further assessed with liver MRI with and without contrast. 4. Single renal cyst bilaterally. 5. Minor aortic atherosclerosis.  Electronically Signed: By: DLajean ManesM.D. 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 KRAS mutated BRAF, NRAS mutation neg MSI Stable.  PET/CT scan done on 06/25/2016 Show multiple foci of hypermetabolic hepatic metastases (atleast 4-5) and 2 hypermetabolic peritoneal nodules along the dorsal peritoneal surface concerning for peritoneal metastases.  MRI Liver 07/27/2016 - Multiple small liver metastases throughout the right and left hepatic lobes, largest measuring 2.1 cm. 1.2 cm enhancing peritoneal nodule in  left paracolic gutter, suspicious for peritoneal metastasis. Other small peritoneal nodules better visualized on recent PET-CT which showed hypermetabolic activity, also suspicious for peritoneal metastases.   Baseline CEA . Lab Results  Component Value Date   CEA 6.1 (H) 06/20/2016    PLAN -Patient tolerated the first 2 cycles of FOLFOX without any prohibitive toxicities. -MRI abd results reviewed. -based on my understand liver lesions might be too numerous/scattered to resect though we would await input from a surgical standpoint from Dr Hassell Done -patient has followup with Dr Hassell Done on 08/20/2016 -If his liver metastases and peripheral metastases are not resectable would add Avastin to his FOLFOX to try to achieve good long-term palliative disease controlled response . -Given his K-ras mutated status he would not be a candidate for cetuximab or panitumumab  treatment. -had genetic counsellor evaluation - testing results pending.  #2. 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 Lab Results  Component Value Date   IRON 269 (H) 08/12/2016   TIBC 454 (H) 08/12/2016   IRONPCTSAT 59 (H) 08/12/2016   (Iron and TIBC)  Lab Results  Component Value Date   FERRITIN 44 08/12/2016   Plan -Patient's hemoglobin has nearly normalized . --continue oral ferrous sulfate 1 tablet by mouth daily with a multivitamin . - we'll monitor this to determine need for IV iron .   #3  Patient Active Problem List   Diagnosis Date Noted  . Family history of colon cancer 07/24/2016  . Family history of prostate cancer 07/24/2016  . Metastatic colon cancer to liver ([place]) 07/01/2016  . Right Colon cancer metastasized to liver ([place]) 06/17/2016  . Chronic GI bleeding 05/29/2016  . Diabetes mellitus type 2, diet-controlled ([place]) 05/22/2016  . OSA (obstructive sleep apnea) 05/22/2016  . Hyperlipidemia 05/22/2016  . Anemia due to blood loss, chronic   . Anemia 05/21/2016   -Continue follow-up with primary care physician .Gara Kroner, MD for optimization of diabetes management . Might need basal insulin if his blood sugars are elevated with very chemotherapy steroids . -Continue use of CPAP for sleep apnea . -off statins  -Continue treatment as per plan with FOLFOX q2weeks with labs RTC with Dr Irene Limbo with C4D1 with labs  All of the patients questions were answered with apparent satisfaction. The patient knows to call the clinic with any problems, questions or concerns.  I spent 20 minutes counseling the patient face to face. The total time spent in the appointment was 25 minutes and more than 50% was on counseling and direct patient cares.    Sullivan Lone MD [place] AAHIVMS [facility] Hematology/Oncology Physician [facility]  (Office):       [phone] (Work cell):   [phone] (Fax):           [phone]

## 2016-08-14 ENCOUNTER — Ambulatory Visit (HOSPITAL_BASED_OUTPATIENT_CLINIC_OR_DEPARTMENT_OTHER): Payer: 59

## 2016-08-14 VITALS — BP 136/77 | HR 78 | Temp 97.8°F | Resp 17

## 2016-08-14 DIAGNOSIS — C787 Secondary malignant neoplasm of liver and intrahepatic bile duct: Secondary | ICD-10-CM | POA: Diagnosis not present

## 2016-08-14 DIAGNOSIS — C189 Malignant neoplasm of colon, unspecified: Secondary | ICD-10-CM | POA: Diagnosis not present

## 2016-08-14 MED ORDER — HEPARIN SOD (PORK) LOCK FLUSH 100 UNIT/ML IV SOLN
500.0000 [IU] | Freq: Once | INTRAVENOUS | Status: AC | PRN
  Administered 2016-08-14: 500 [IU]
  Filled 2016-08-14: qty 5

## 2016-08-14 MED ORDER — SODIUM CHLORIDE 0.9% FLUSH
10.0000 mL | INTRAVENOUS | Status: DC | PRN
  Administered 2016-08-14: 10 mL
  Filled 2016-08-14: qty 10

## 2016-08-14 NOTE — Patient Instructions (Signed)

## 2016-08-26 ENCOUNTER — Ambulatory Visit (HOSPITAL_COMMUNITY)
Admission: RE | Admit: 2016-08-26 | Discharge: 2016-08-26 | Disposition: A | Discharge status: Home / Self Care | Payer: 59 | Source: Ambulatory Visit | Attending: Hematology | Admitting: Hematology

## 2016-08-26 ENCOUNTER — Ambulatory Visit (HOSPITAL_BASED_OUTPATIENT_CLINIC_OR_DEPARTMENT_OTHER): Payer: 59

## 2016-08-26 ENCOUNTER — Other Ambulatory Visit: Payer: Self-pay | Admitting: *Deleted

## 2016-08-26 ENCOUNTER — Telehealth: Payer: Self-pay | Admitting: Hematology

## 2016-08-26 ENCOUNTER — Ambulatory Visit (HOSPITAL_BASED_OUTPATIENT_CLINIC_OR_DEPARTMENT_OTHER): Payer: 59 | Admitting: Hematology

## 2016-08-26 ENCOUNTER — Other Ambulatory Visit (HOSPITAL_BASED_OUTPATIENT_CLINIC_OR_DEPARTMENT_OTHER): Payer: 59

## 2016-08-26 ENCOUNTER — Encounter: Payer: Self-pay | Admitting: *Deleted

## 2016-08-26 ENCOUNTER — Encounter: Payer: Self-pay | Admitting: Hematology

## 2016-08-26 VITALS — BP 122/79 | HR 77 | Temp 98.2°F | Resp 20 | Ht 74.0 in | Wt 254.1 lb

## 2016-08-26 DIAGNOSIS — K0889 Other specified disorders of teeth and supporting structures: Secondary | ICD-10-CM | POA: Diagnosis not present

## 2016-08-26 DIAGNOSIS — C787 Secondary malignant neoplasm of liver and intrahepatic bile duct: Secondary | ICD-10-CM

## 2016-08-26 DIAGNOSIS — C189 Malignant neoplasm of colon, unspecified: Secondary | ICD-10-CM

## 2016-08-26 DIAGNOSIS — C182 Malignant neoplasm of ascending colon: Secondary | ICD-10-CM | POA: Diagnosis not present

## 2016-08-26 DIAGNOSIS — K922 Gastrointestinal hemorrhage, unspecified: Secondary | ICD-10-CM

## 2016-08-26 DIAGNOSIS — Z5111 Encounter for antineoplastic chemotherapy: Secondary | ICD-10-CM

## 2016-08-26 DIAGNOSIS — D500 Iron deficiency anemia secondary to blood loss (chronic): Secondary | ICD-10-CM | POA: Diagnosis not present

## 2016-08-26 LAB — CBC & DIFF AND RETIC
BASO%: 0.7 % (ref 0.0–2.0)
BASOS ABS: 0 10*3/uL (ref 0.0–0.1)
EOS ABS: 0.2 10*3/uL (ref 0.0–0.5)
EOS%: 3.9 % (ref 0.0–7.0)
HCT: 41.3 % (ref 38.4–49.9)
HEMOGLOBIN: 13.5 g/dL (ref 13.0–17.1)
IMMATURE RETIC FRACT: 6.4 % (ref 3.00–10.60)
LYMPH#: 1.3 10*3/uL (ref 0.9–3.3)
LYMPH%: 29 % (ref 14.0–49.0)
MCH: 26.8 pg — ABNORMAL LOW (ref 27.2–33.4)
MCHC: 32.7 g/dL (ref 32.0–36.0)
MCV: 82.1 fL (ref 79.3–98.0)
MONO#: 0.9 10*3/uL (ref 0.1–0.9)
MONO%: 20.3 % — ABNORMAL HIGH (ref 0.0–14.0)
NEUT%: 46.1 % (ref 39.0–75.0)
NEUTROS ABS: 2 10*3/uL (ref 1.5–6.5)
NRBC: 0 % (ref 0–0)
Platelets: 124 10*3/uL — ABNORMAL LOW (ref 140–400)
RBC: 5.03 10*6/uL (ref 4.20–5.82)
RDW: 20.5 % — AB (ref 11.0–14.6)
RETIC %: 1.6 % (ref 0.80–1.80)
RETIC CT ABS: 80.48 10*3/uL (ref 34.80–93.90)
WBC: 4.4 10*3/uL (ref 4.0–10.3)

## 2016-08-26 LAB — COMPREHENSIVE METABOLIC PANEL
ALT: 59 U/L — AB (ref 0–55)
AST: 42 U/L — AB (ref 5–34)
Albumin: 3.8 g/dL (ref 3.5–5.0)
Alkaline Phosphatase: 93 U/L (ref 40–150)
Anion Gap: 10 mEq/L (ref 3–11)
BILIRUBIN TOTAL: 0.4 mg/dL (ref 0.20–1.20)
BUN: 10.1 mg/dL (ref 7.0–26.0)
CO2: 25 meq/L (ref 22–29)
Calcium: 9.7 mg/dL (ref 8.4–10.4)
Chloride: 108 mEq/L (ref 98–109)
Creatinine: 1.1 mg/dL (ref 0.7–1.3)
EGFR: 84 mL/min/{1.73_m2} — AB (ref >90)
GLUCOSE: 99 mg/dL (ref 70–140)
Potassium: 4.7 mEq/L (ref 3.5–5.1)
SODIUM: 142 meq/L (ref 136–145)
TOTAL PROTEIN: 7.2 g/dL (ref 6.4–8.3)

## 2016-08-26 LAB — IRON AND TIBC
%SAT: 75 % — ABNORMAL HIGH (ref 20–55)
Iron: 358 ug/dL — ABNORMAL HIGH (ref 42–163)
TIBC: 480 ug/dL — AB (ref 202–409)
UIBC: 122 ug/dL (ref 117–376)

## 2016-08-26 LAB — CEA (IN HOUSE-CHCC): CEA (CHCC-IN HOUSE): 5.69 ng/mL — AB (ref 0.00–5.00)

## 2016-08-26 LAB — FERRITIN: Ferritin: 80 ng/ml (ref 22–316)

## 2016-08-26 MED ORDER — DEXTROSE 5 % IV SOLN
Freq: Once | INTRAVENOUS | Status: AC
  Administered 2016-08-26: 12:00:00 via INTRAVENOUS

## 2016-08-26 MED ORDER — DEXAMETHASONE SODIUM PHOSPHATE 10 MG/ML IJ SOLN
INTRAMUSCULAR | Status: AC
  Filled 2016-08-26: qty 1

## 2016-08-26 MED ORDER — CLINDAMYCIN HCL 300 MG PO CAPS: 300.0000 mg | ORAL_CAPSULE | Freq: Four times a day (QID) | ORAL | 0 refills | Status: AC

## 2016-08-26 MED ORDER — SODIUM CHLORIDE 0.9 % IV SOLN
2400.0000 mg/m2 | INTRAVENOUS | Status: DC
  Administered 2016-08-26: 5800 mg via INTRAVENOUS
  Filled 2016-08-26: qty 116

## 2016-08-26 MED ORDER — OXALIPLATIN CHEMO INJECTION 100 MG/20ML
84.0000 mg/m2 | Freq: Once | INTRAVENOUS | Status: AC
  Administered 2016-08-26: 200 mg via INTRAVENOUS
  Filled 2016-08-26: qty 40

## 2016-08-26 MED ORDER — FLUOROURACIL CHEMO INJECTION 2.5 GM/50ML
400.0000 mg/m2 | Freq: Once | INTRAVENOUS | Status: AC
  Administered 2016-08-26: 950 mg via INTRAVENOUS
  Filled 2016-08-26: qty 19

## 2016-08-26 MED ORDER — PALONOSETRON HCL INJECTION 0.25 MG/5ML
INTRAVENOUS | Status: AC
  Filled 2016-08-26: qty 5

## 2016-08-26 MED ORDER — LEUCOVORIN CALCIUM INJECTION 350 MG
400.0000 mg/m2 | Freq: Once | INTRAVENOUS | Status: AC
  Administered 2016-08-26: 964 mg via INTRAVENOUS
  Filled 2016-08-26: qty 48.2

## 2016-08-26 MED ORDER — DEXAMETHASONE SODIUM PHOSPHATE 10 MG/ML IJ SOLN
10.0000 mg | Freq: Once | INTRAMUSCULAR | Status: AC
  Administered 2016-08-26: 10 mg via INTRAVENOUS

## 2016-08-26 MED ORDER — CHLORHEXIDINE GLUCONATE 0.12 % MT SOLN: 10.0000 mL | Freq: Three times a day (TID) | OROMUCOSAL | 0 refills | Status: AC

## 2016-08-26 MED ORDER — PALONOSETRON HCL INJECTION 0.25 MG/5ML
0.2500 mg | Freq: Once | INTRAVENOUS | Status: AC
  Administered 2016-08-26: 0.25 mg via INTRAVENOUS

## 2016-08-26 NOTE — Progress Notes (Signed)
Oncology Nurse Navigator Documentation  Oncology Nurse Navigator Flowsheets 08/26/2016  Navigator Location [facility]  Navigator Encounter Type Treatment  Telephone -  Treatment Initiated Date -  Patient Visit Type MedOnc  Treatment Phase Active Tx--FOLFOX #4  Barriers/Navigation Needs Coordination of Care  Education -  Interventions Coordination of Care  Coordination of Care Appts--needed lab added to 09/24/15 treatment appointment.  Education Method -  Support Groups/Services Other--called Paediatric nurse at [facility] and requested he be put on mailing list for monthly calendar of events/groups per his request.  Acuity Level 1  Time Spent with Patient 15  Explained that MD usually sees him every other treatment, but needs to see him at next treatment to determine if he needs to remove the Avastin from his chemo regimen, based on if Dr. Barry Dienes will be doing surgery or not.

## 2016-08-26 NOTE — Progress Notes (Signed)
[name]    HEMATOLOGY/ONCOLOGY CLINIC NOTE  Date of Service: .08/26/2016  Patient Care Team: [name], MD as PCP - General (Family Medicine) [name] M.D. (general surgery)  CHIEF COMPLAINTS/PURPOSE OF CONSULTATION:  Post hospitalization followup for colon cancer  HISTORY OF PRESENTING ILLNESS:   Scott Gallagher is a wonderful 46 y.o. male who has been referred to [place] by Dr .[name], MD for evaluation and management of newly diagnosed colon cancer likely with metastases to the liver.  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 He noted some fatigue but no other acute symptoms or overt GI bleeding.  Patient required transfusion of 2 units of PRBCs and IV iron. He subsequently had a colonoscopy which showed a near obstructing mass of his ascending colon close to the cecum which appeared concerning for colon cancer.Biopsy showed atleast high grade adenomatous dysplasia. CT chest /abd/pelvis was done on 06/08/2016 and showed 3.7 cm in length area of narrowing of the ascending colon, which lies 2 cm above the ileocecal junction, initially felt to likely reflect an area of spasm, but consistent with a constricting lesion. There are 2 adjacent sub cm shotty mesenteric lymph nodes, the largest measuring 9 mm in short axis.12 mm low-density lesion in the anterior segment of the right lobe. Liver otherwise unremarkable.  Patient underwent laparoscopic assisted rt hemicolectomy on 06/17/2016 , liver was surveyed and an umbilicated nodule was noted on the rt lobe of liver and a biopsy of this was done that revealed adenocarcinoma on frozen section.  Was seen in the hospital and was referred to continue follow-up with Dr. Marin Olp but chooses to continue  follow-up here since it is close to where he lives and that it would be more convenient to him.   We discussed his pathology results and details. We discussed the need for a PET/CT scan towards the end of the month to have an accurate baseline staging and more specifically to evaluate residual disease in his liver and other sites since this will determine whether he has isolated oligo metastatic disease in the liver or if it is more widespread.  We discussed the rationale, attention adverse effects, advantages, alternatives to FOLFOX chemotherapy. We discussed the role of port placement. We discussed the specifics of chemotherapy and all his questions were answered in great details.   He is currently healing very well after surgery with incisions having close up nicely. Mild postoperative discomfort but good bowel movements and no other acute issues. No evidence of rectal bleeding.  INTERVAL HISTORY  Patient is here for follow-up prior to his 4th cycle of FOLFOX . He was seen by Dr. Hassell Done for follow-up on 08/20/2016 and has been referred to Dr. Barry Dienes for evaluation of hepatic metastatectomy feasibility. Has an appointment with Dr. Barry Dienes on 09/01/2016. Notes some pain and discomfort over his right upper jaw in one of his molars which has a crown. Notes some issues with food getting stuck under the crown and dental discomfort with some mild serosanguineous discharge no pus. No overt tenderness to palpation though there were some sensitive areas at the base of the crown.  He was given Peridex mouthwash 3-4 times a day. He was given a prescription for clindamycin (PCN allergic) to consider starting  it if his dental pain got worse or he develop new swelling. Due for dental x-ray today. He shall be following up with his dentist. No other acute toxicities from his last cycle of chemotherapy. Diarrhea has been manageable. Tolerating oral iron. No tingling or numbness in his hands or feet. No evidence of GI  bleeding.   MEDICAL HISTORY:  Past Medical History:  Diagnosis Date  . Anemia   . Arthritis   . Cancer ([place])   . Diabetes mellitus without complication (HCC)    diet controlled  . History of blood transfusion   . Hyperlipidemia   . Sleep apnea    cpap  . Wears glasses     SURGICAL HISTORY: Past Surgical History:  Procedure Laterality Date  . COLON SURGERY    . LAPAROSCOPIC RIGHT HEMI COLECTOMY Right 06/17/2016   Procedure: LAPAROSCOPIC ASSISTED  RIGHT HEMI COLECTOMY;  Surgeon: [name], MD;  Location: [facility];  Service: General;  Laterality: Right;  . PORTACATH PLACEMENT Left 07/13/2016   Procedure: INSERTION PORT-A-CATH left subclavian;  Surgeon: [name], MD;  Location: [facility];  Service: General;  Laterality: Left;  . SHOULDER ACROMIOPLASTY Right 12/20/2014   Procedure: SHOULDER ACROMIOPLASTY;  Surgeon: Melrose Nakayama, MD;  Location: [place];  Service: Orthopedics;  Laterality: Right;  . SHOULDER ARTHROSCOPY Right 12/20/2014   Procedure: RIGHT ARTHROSCOPY SHOULDER WITH DEBRIDEMENT;  Surgeon: Melrose Nakayama, MD;  Location: [place];  Service: Orthopedics;  Laterality: Right;  . TESTICLE SURGERY     as teen  . TONSILLECTOMY    . WISDOM TOOTH EXTRACTION      SOCIAL HISTORY: Social History   Social History  . Marital status: Married    Spouse name: N/A  . Number of children: N/A  . Years of education: N/A   Occupational History  . cemetery maintenance    Social History Main Topics  . Smoking status: Former Smoker    Packs/day: 1.50    Years: 29.00    Types: Cigarettes    Quit date: 12/16/2012  . Smokeless tobacco: Never Used     Comment: 1-2 ppd from age [age]y to 2014  . Alcohol use 1.8 oz/week    3 Shots of liquor per week     Comment:  some days  . Drug use: No  . Sexual activity: Not on file   Other Topics Concern  . Not on file   Social History Narrative  . No narrative on file    FAMILY HISTORY: Family  History  Problem Relation Age of Onset  . Diabetes Other   . Hyperlipidemia Other   . Hypertension Other   . Breast cancer Maternal Aunt 70  . Prostate cancer Maternal Uncle 75  . Lung cancer Paternal Uncle 58  . Stroke Maternal Grandfather 72  . Cancer Paternal Grandmother 35    dx cancer of pancreas and colon, unknown if separate primaries  . Heart Problems Paternal Grandfather     d. 62  . Prostate cancer Maternal Uncle 49  . Breast cancer Maternal Aunt 75  . Breast cancer Maternal Aunt 68  . Cervical cancer Maternal Aunt 68  . Pancreatic cancer Maternal Aunt 54    d. 58y; heavy smoker  . Colon cancer Paternal Uncle 72    s/p partial colectomy; dx. second colon cancer at age 75-64    ALLERGIES:  is allergic to penicillins.  MEDICATIONS:  Current Outpatient Prescriptions  Medication Sig Dispense Refill  . chlorhexidine (PERIDEX) 0.12 %  solution Use as directed 10 mLs in the mouth or throat 3 (three) times daily. 420 mL 0  . clindamycin (CLEOCIN) 300 MG capsule Take 1 capsule (300 mg total) by mouth 4 (four) times daily. Start taking if increasing dental pain and swelling occurs 28 capsule 0  . dexamethasone (DECADRON) 4 MG tablet Take 2 tablets (8 mg total) by mouth daily. Start the day after chemotherapy for 2 days. Take with food. 30 tablet 1  . lidocaine-prilocaine (EMLA) cream Apply to affected area once 30 g 3  . LORazepam (ATIVAN) 0.5 MG tablet Take 1 tablet (0.5 mg total) by mouth every 8 (eight) hours. For anxiety or sleep 30 tablet 0  . Multiple Vitamin (MULTIVITAMIN) tablet Take 1 tablet by mouth daily.    . ondansetron (ZOFRAN) 8 MG tablet Take 1 tablet (8 mg total) by mouth 2 (two) times daily as needed for refractory nausea / vomiting. Start on day 3 after chemotherapy. 30 tablet 1  . oxyCODONE-acetaminophen (PERCOCET/ROXICET) 5-325 MG tablet Take 1-2 tablets by mouth every 4 (four) hours as needed for moderate pain. 30 tablet 0  . Probiotic Product (PROBIOTIC PO)  Take 1 tablet by mouth daily.    . prochlorperazine (COMPAZINE) 10 MG tablet Take 1 tablet (10 mg total) by mouth every 6 (six) hours as needed (Nausea or vomiting). 30 tablet 1   No current facility-administered medications for this visit.     REVIEW OF SYSTEMS:    10 Point review of Systems was done is negative except as noted above.  PHYSICAL EXAMINATION: ECOG PERFORMANCE STATUS: 1 - Symptomatic but completely ambulatory  . Vitals:   08/26/16 1022  BP: 122/79  Pulse: 77  Resp: 20  Temp: 98.2 F (36.8 C)   Filed Weights   08/26/16 1022  Weight: 254 lb 1.6 oz (115.3 kg)   .Body mass index is 32.62 kg/m. GENERAL:alert, in no acute distress and comfortable SKIN: skin color, texture, turgor are normal, no rashes or significant lesions EYES: normal, conjunctiva are pink and non-injected, sclera clear OROPHARYNX:no exudate, no erythema and lips, buccal mucosa, and tongue normal  NECK: supple, no JVD, thyroid normal size, non-tender, without nodularity LYMPH:  no palpable lymphadenopathy in the cervical, axillary or inguinal LUNGS: clear to auscultation with normal respiratory effort HEART: regular rate & rhythm,  no murmurs and no lower extremity edema ABDOMEN: abdomen soft, bowel sounds normoactive, healing laparoscopic surgical incisions with no discharge look clean. Musculoskeletal: no cyanosis of digits and no clubbing  PSYCH: alert & oriented x 3 with fluent speech NEURO: no focal motor/sensory deficits   LABORATORY DATA:  I have reviewed the data as listed  . CBC Latest Ref Rng & Units 08/12/2016 08/05/2016 07/29/2016  WBC 4.0 - 10.3 10e3/uL 5.9 5.1 6.1  Hemoglobin 13.0 - 17.1 g/dL 12.7(L) 12.4(L) 12.4(L)  Hematocrit 38.4 - 49.9 % 39.4 37.7(L) 39.4  Platelets 140 - 400 10e3/uL 161 272 224    . CMP Latest Ref Rng & Units 08/26/2016 08/12/2016 08/05/2016  Glucose 70 - 140 mg/dl 99 107 108  BUN 7.0 - 26.0 mg/dL 10.1 14.3 15.5  Creatinine 0.7 - 1.3 mg/dL 1.1 1.0  0.9  Sodium 136 - 145 mEq/L 142 139 138  Potassium 3.5 - 5.1 mEq/L 4.7 4.4 3.8  Chloride 101 - 111 mmol/L - - -  CO2 22 - 29 mEq/L 25 25 22   Calcium 8.4 - 10.4 mg/dL 9.7 9.3 8.9  Total Protein 6.4 - 8.3 g/dL 7.2 7.0 6.9  Total  Bilirubin 0.20 - 1.20 mg/dL 0.40 0.32 0.33  Alkaline Phos 40 - 150 U/L 93 91 86  AST 5 - 34 U/L 42(H) 38(H) 15  ALT 0 - 55 U/L 59(H) 56(H) 21       Microscopic Comment 2. COLON AND RECTUM (INCLUDING TRANS-ANAL RESECTION): Specimen: Terminal ileum, right colon and appendix Procedure: Segmental resection Tumor site: Proximal ascending colon Specimen integrity: Intact Macroscopic intactness of mesorectum: Not applicable: x Complete: NA Near complete: NA Incomplete: NA Cannot be determined (specify): NA Macroscopic tumor perforation: The mass invades through muscularis propria into pericolonic soft tissue Invasive tumor: Maximum size: 5.5 cm Histologic type(s): Adenocarcinoma Histologic grade and differentiation: G3 G1: well differentiated/low grade 1 of 4 Supplemental copy SUPPLEMENTAL for [place], Caine ([id number]) Microscopic Comment(continued) G2: moderately differentiated/low grade G3: poorly differentiated/high grade G4: undifferentiated/high grade Type of polyp in which invasive carcinoma arose: Tubular adenoma Microscopic extension of invasive tumor: The mass invade through the muscularis propria into pericolonic soft tissue Lymph-Vascular invasion: Identified Peri-neural invasion: Identified Tumor deposit(s) (discontinuous extramural extension): Present Resection margins: Proximal margin: Negative Distal margin: Negative Circumferential (radial) (posterior ascending, posterior descending; lateral and posterior mid-rectum; and entire lower 1/3 rectum):Negative Mesenteric margin (sigmoid and transverse): NA Distance closest margin (if all above margins negative): 3.7 cm from the non peritonealized pericolic soft tissue margin Trans-anal  resection margins only: Deep margin: NA Mucosal Margin: NA Distance closest mucosal margin (if negative): NA Treatment effect (neo-adjuvant therapy): NA Additional polyp(s): Negative Non-neoplastic findings: Unremarkable Lymph nodes: number examined 18; number positive: 5 Pathologic Staging: pT3, N2a, M1a Ancillary studies: MSI ordered   RADIOGRAPHIC STUDIES: I have personally reviewed the radiological images as listed and agreed with the findings in the report. No results found. CT CHEST, ABDOMEN, AND PELVIS WITH CONTRAST 05/23/2016  TECHNIQUE: Multidetector CT imaging of the chest, abdomen and pelvis was performed following the standard protocol during bolus administration of intravenous contrast.  CONTRAST:  163m ISOVUE-300 IOPAMIDOL (ISOVUE-300) INJECTION 61%  COMPARISON:  None.  FINDINGS: CT CHEST FINDINGS  Cardiovascular: Heart is normal in size and configuration. No coronary artery calcifications. Great vessels normal in caliber. No aortic atherosclerosis.  Mediastinum/Nodes: No enlarged mediastinal, hilar, or axillary lymph nodes. Thyroid gland, trachea, and esophagus demonstrate no significant findings.  Lungs/Pleura: Minor subsegmental atelectasis in the right lower lobe. Lungs otherwise clear. No pleural effusion or pneumothorax.  Musculoskeletal: No chest wall abnormality. No acute or significant osseous findings.  CT ABDOMEN PELVIS FINDINGS  Hepatobiliary: 12 mm low-density lesion in the anterior segment of the right lobe. Liver otherwise unremarkable. Normal gallbladder. No bile duct dilation.  Pancreas: Unremarkable. No pancreatic ductal dilatation or surrounding inflammatory changes.  Spleen: Normal in size without focal abnormality.  Adrenals/Urinary Tract: No adrenal masses. Single low-density masses in each kidney measuring 13 mm, in the mid to lower pole on each side, consistent with cysts. No other renal masses, no stones and  no hydronephrosis. Normal ureters. Normal bladder.  Stomach/Bowel: Stomach is within normal limits. Appendix appears normal. No evidence of bowel wall thickening, distention, or inflammatory changes.  Vascular/Lymphatic: Minor inferior aorta atherosclerotic calcifications. No aneurysm. No adenopathy.  Reproductive: Prostate is unremarkable.  Other: No abdominal wall hernia or abnormality. No abdominopelvic ascites.  Musculoskeletal: Unremarkable.  IMPRESSION: 1. No acute findings within the chest, abdomen or pelvis. 2. No findings to account for chronic blood loss. 3. 12 mm low-density liver lesion. This is not as low in attenuation as would be expected for a cyst. The lesion is nonspecific. This  could be further assessed with liver MRI with and without contrast. 4. Single renal cyst bilaterally. 5. Minor aortic atherosclerosis.  Electronically Signed: By: Lajean Manes M.D. 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 KRAS mutated BRAF, NRAS mutation neg MSI Stable.  PET/CT scan done on 06/25/2016 Show multiple foci of hypermetabolic hepatic metastases (atleast 4-5) and 2 hypermetabolic peritoneal nodules along the dorsal peritoneal surface concerning for peritoneal metastases.  MRI Liver 07/27/2016 - Multiple small liver metastases throughout the right and left hepatic lobes, largest measuring 2.1 cm. 1.2 cm enhancing peritoneal nodule in left paracolic gutter, suspicious for peritoneal metastasis. Other small peritoneal nodules better visualized on recent PET-CT which showed hypermetabolic activity, also suspicious for peritoneal metastases.   Baseline CEA . Lab Results  Component Value Date   CEA 6.1 (H) 06/20/2016     PLAN -Patient tolerated the first 3 cycles of FOLFOX without any prohibitive toxicities. -Patient is seeing Dr. Barry Dienes for evaluation to determine resectability of his liver +/-peritoneal lesions --has appointment on 09/01/2016.  -we will add Avastin from next cycle depending on surgical plan. -If his liver metastases and peripheral metastases are not resectable would add Avastin to his FOLFOX to try to achieve good long-term palliative disease controlled response . -Given his K-ras mutated status he would not be a candidate for cetuximab or panitumumab treatment. -had genetic counsellor evaluation - testing results pending. -recommended to extend dexamethasone for additonal 2 days D4 and D5 if significant nausea noted.  #2 Rt upper jaw dental pain --periodonitis r/o dental abscess Plan -peridex mouthwash QID -Panorex X-ray today -Clindamycin if X ray evidence of peridontal abscess -f/u with his dentist ASAP  #2. 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 Lab Results  Component Value Date   IRON 358 (H) 08/26/2016   TIBC 480 (H) 08/26/2016   IRONPCTSAT 75 (H) 08/26/2016   (Iron and TIBC)  Lab Results  Component Value Date   FERRITIN 80 08/26/2016   Plan -Patient's iron levels continue to improve and have nearly normalized . --continue oral ferrous sulfate 1 tablet by mouth daily with a multivitamin .    #3  Patient Active Problem List   Diagnosis Date Noted  . Family history of colon cancer 07/24/2016  . Family history of prostate cancer 07/24/2016  . Metastatic colon cancer to liver ([place]) 07/01/2016  . Right Colon cancer metastasized to liver ([place]) 06/17/2016  . Chronic GI bleeding 05/29/2016  . Diabetes mellitus type 2, diet-controlled ([place]) 05/22/2016  . OSA (obstructive sleep apnea) 05/22/2016  . Hyperlipidemia 05/22/2016  . Anemia due to blood loss, chronic   . Anemia  05/21/2016   -Continue follow-up with primary care physician .Gara Kroner, MD for optimization of diabetes management . Might need basal insulin if his blood sugars are elevated with very chemotherapy steroids . -Continue use of CPAP for sleep apnea . -off statins  -continue FOLFOX per schedule. Will be adding Avastin from next cycle depending on input from surgery. -RTC with Dr Irene Limbo in 2 weeks with labs C5D1 -dental X ray today  All of the patients questions were answered with apparent satisfaction. The patient knows to call the clinic with any problems, questions or  concerns.  I spent 20 minutes counseling the patient face to face. The total time spent in the appointment was 25 minutes and more than 50% was on counseling and direct patient cares.    Sullivan Lone MD [place] AAHIVMS [facility] Hematology/Oncology Physician [facility]  (Office):       [phone] (Work cell):  [phone] (Fax):           [phone]

## 2016-08-26 NOTE — Telephone Encounter (Signed)
GAVE PATIENT WIFE AVS REPORT AND APPOINTMENTS FOR January AND February. WIFE AWARE XRAY CAN BE DONE AT [facility] AFTER TX.

## 2016-08-26 NOTE — Patient Instructions (Signed)
[facility] Discharge Instructions for Patients Receiving Chemotherapy  Today you received the following chemotherapy agents:  Oxaliplatin, Leucovorin, Fluorouracil  To help prevent nausea and vomiting after your treatment, we encourage you to take your nausea medication as prescribed.   If you develop nausea and vomiting that is not controlled by your nausea medication, call the clinic.   BELOW ARE SYMPTOMS THAT SHOULD BE REPORTED IMMEDIATELY:  *FEVER GREATER THAN 100.5 F  *CHILLS WITH OR WITHOUT FEVER  NAUSEA AND VOMITING THAT IS NOT CONTROLLED WITH YOUR NAUSEA MEDICATION  *UNUSUAL SHORTNESS OF BREATH  *UNUSUAL BRUISING OR BLEEDING  TENDERNESS IN MOUTH AND THROAT WITH OR WITHOUT PRESENCE OF ULCERS  *URINARY PROBLEMS  *BOWEL PROBLEMS  UNUSUAL RASH Items with * indicate a potential emergency and should be followed up as soon as possible.  Feel free to call the clinic you have any questions or concerns. The clinic phone number is [phone].  Please show the CHEMO ALERT CARD at check-in to the Emergency Department and triage nurse.   

## 2016-08-28 ENCOUNTER — Ambulatory Visit (HOSPITAL_BASED_OUTPATIENT_CLINIC_OR_DEPARTMENT_OTHER): Payer: 59

## 2016-08-28 VITALS — BP 128/76 | HR 86 | Temp 98.4°F

## 2016-08-28 DIAGNOSIS — Z452 Encounter for adjustment and management of vascular access device: Secondary | ICD-10-CM | POA: Diagnosis not present

## 2016-08-28 DIAGNOSIS — C182 Malignant neoplasm of ascending colon: Secondary | ICD-10-CM

## 2016-08-28 DIAGNOSIS — C787 Secondary malignant neoplasm of liver and intrahepatic bile duct: Principal | ICD-10-CM

## 2016-08-28 DIAGNOSIS — C189 Malignant neoplasm of colon, unspecified: Secondary | ICD-10-CM

## 2016-08-28 MED ORDER — SODIUM CHLORIDE 0.9% FLUSH
10.0000 mL | INTRAVENOUS | Status: DC | PRN
  Administered 2016-08-28: 10 mL
  Filled 2016-08-28: qty 10

## 2016-08-28 MED ORDER — HEPARIN SOD (PORK) LOCK FLUSH 100 UNIT/ML IV SOLN
500.0000 [IU] | Freq: Once | INTRAVENOUS | Status: AC | PRN
  Administered 2016-08-28: 500 [IU]
  Filled 2016-08-28: qty 5

## 2016-08-28 NOTE — Patient Instructions (Signed)

## 2016-09-01 DIAGNOSIS — C189 Malignant neoplasm of colon, unspecified: Secondary | ICD-10-CM | POA: Diagnosis not present

## 2016-09-08 ENCOUNTER — Other Ambulatory Visit: Payer: Self-pay | Admitting: *Deleted

## 2016-09-08 DIAGNOSIS — C189 Malignant neoplasm of colon, unspecified: Secondary | ICD-10-CM

## 2016-09-08 DIAGNOSIS — C787 Secondary malignant neoplasm of liver and intrahepatic bile duct: Principal | ICD-10-CM

## 2016-09-09 ENCOUNTER — Telehealth: Payer: Self-pay | Admitting: Hematology

## 2016-09-09 ENCOUNTER — Telehealth: Payer: Self-pay | Admitting: *Deleted

## 2016-09-09 ENCOUNTER — Ambulatory Visit (HOSPITAL_BASED_OUTPATIENT_CLINIC_OR_DEPARTMENT_OTHER): Payer: 59

## 2016-09-09 ENCOUNTER — Encounter: Payer: Self-pay | Admitting: Hematology

## 2016-09-09 ENCOUNTER — Other Ambulatory Visit (HOSPITAL_BASED_OUTPATIENT_CLINIC_OR_DEPARTMENT_OTHER): Payer: 59

## 2016-09-09 ENCOUNTER — Ambulatory Visit (HOSPITAL_BASED_OUTPATIENT_CLINIC_OR_DEPARTMENT_OTHER): Payer: 59 | Admitting: Hematology

## 2016-09-09 VITALS — BP 118/74 | HR 76 | Temp 98.4°F | Resp 17 | Ht 74.0 in | Wt 256.3 lb

## 2016-09-09 VITALS — BP 133/78

## 2016-09-09 DIAGNOSIS — C189 Malignant neoplasm of colon, unspecified: Secondary | ICD-10-CM

## 2016-09-09 DIAGNOSIS — C182 Malignant neoplasm of ascending colon: Secondary | ICD-10-CM

## 2016-09-09 DIAGNOSIS — C787 Secondary malignant neoplasm of liver and intrahepatic bile duct: Secondary | ICD-10-CM

## 2016-09-09 DIAGNOSIS — Z5111 Encounter for antineoplastic chemotherapy: Secondary | ICD-10-CM

## 2016-09-09 DIAGNOSIS — C786 Secondary malignant neoplasm of retroperitoneum and peritoneum: Secondary | ICD-10-CM | POA: Diagnosis not present

## 2016-09-09 DIAGNOSIS — D500 Iron deficiency anemia secondary to blood loss (chronic): Secondary | ICD-10-CM

## 2016-09-09 DIAGNOSIS — Z5112 Encounter for antineoplastic immunotherapy: Secondary | ICD-10-CM

## 2016-09-09 LAB — CBC & DIFF AND RETIC
BASO%: 0.7 % (ref 0.0–2.0)
BASOS ABS: 0 10*3/uL (ref 0.0–0.1)
EOS%: 3.6 % (ref 0.0–7.0)
Eosinophils Absolute: 0.2 10*3/uL (ref 0.0–0.5)
HEMATOCRIT: 41.9 % (ref 38.4–49.9)
HGB: 13.4 g/dL (ref 13.0–17.1)
Immature Retic Fract: 8.4 % (ref 3.00–10.60)
LYMPH#: 1.4 10*3/uL (ref 0.9–3.3)
LYMPH%: 33.7 % (ref 14.0–49.0)
MCH: 27.1 pg — ABNORMAL LOW (ref 27.2–33.4)
MCHC: 32 g/dL (ref 32.0–36.0)
MCV: 84.8 fL (ref 79.3–98.0)
MONO#: 0.6 10*3/uL (ref 0.1–0.9)
MONO%: 13.3 % (ref 0.0–14.0)
NEUT%: 48.7 % (ref 39.0–75.0)
NEUTROS ABS: 2 10*3/uL (ref 1.5–6.5)
Platelets: 108 10*3/uL — ABNORMAL LOW (ref 140–400)
RBC: 4.94 10*6/uL (ref 4.20–5.82)
RDW: 20 % — AB (ref 11.0–14.6)
RETIC %: 2.32 % — AB (ref 0.80–1.80)
RETIC CT ABS: 114.61 10*3/uL — AB (ref 34.80–93.90)
WBC: 4.1 10*3/uL (ref 4.0–10.3)

## 2016-09-09 LAB — COMPREHENSIVE METABOLIC PANEL
ALT: 42 U/L (ref 0–55)
ANION GAP: 10 meq/L (ref 3–11)
AST: 34 U/L (ref 5–34)
Albumin: 3.7 g/dL (ref 3.5–5.0)
Alkaline Phosphatase: 90 U/L (ref 40–150)
BUN: 9.1 mg/dL (ref 7.0–26.0)
CALCIUM: 9.8 mg/dL (ref 8.4–10.4)
CHLORIDE: 106 meq/L (ref 98–109)
CO2: 25 meq/L (ref 22–29)
Creatinine: 1 mg/dL (ref 0.7–1.3)
EGFR: 86 mL/min/{1.73_m2} — ABNORMAL LOW (ref >90)
Glucose: 126 mg/dl (ref 70–140)
POTASSIUM: 4.6 meq/L (ref 3.5–5.1)
Sodium: 141 mEq/L (ref 136–145)
Total Bilirubin: 0.42 mg/dL (ref 0.20–1.20)
Total Protein: 7 g/dL (ref 6.4–8.3)

## 2016-09-09 LAB — UA PROTEIN, DIPSTICK - CHCC: PROTEIN: NEGATIVE mg/dL

## 2016-09-09 MED ORDER — PALONOSETRON HCL INJECTION 0.25 MG/5ML
INTRAVENOUS | Status: AC
  Filled 2016-09-09: qty 5

## 2016-09-09 MED ORDER — OXALIPLATIN CHEMO INJECTION 100 MG/20ML
84.0000 mg/m2 | Freq: Once | INTRAVENOUS | Status: AC
  Administered 2016-09-09: 200 mg via INTRAVENOUS
  Filled 2016-09-09: qty 40

## 2016-09-09 MED ORDER — DEXAMETHASONE SODIUM PHOSPHATE 10 MG/ML IJ SOLN
10.0000 mg | Freq: Once | INTRAMUSCULAR | Status: AC
  Administered 2016-09-09: 10 mg via INTRAVENOUS

## 2016-09-09 MED ORDER — DEXTROSE 5 % IV SOLN
400.0000 mg/m2 | Freq: Once | INTRAVENOUS | Status: AC
  Administered 2016-09-09: 964 mg via INTRAVENOUS
  Filled 2016-09-09: qty 48.2

## 2016-09-09 MED ORDER — SODIUM CHLORIDE 0.9% FLUSH
10.0000 mL | INTRAVENOUS | Status: DC | PRN
  Filled 2016-09-09: qty 10

## 2016-09-09 MED ORDER — DEXTROSE 5 % IV SOLN
Freq: Once | INTRAVENOUS | Status: AC
  Administered 2016-09-09: 11:00:00 via INTRAVENOUS

## 2016-09-09 MED ORDER — FLUOROURACIL CHEMO INJECTION 2.5 GM/50ML
400.0000 mg/m2 | Freq: Once | INTRAVENOUS | Status: AC
  Administered 2016-09-09: 950 mg via INTRAVENOUS
  Filled 2016-09-09: qty 19

## 2016-09-09 MED ORDER — SODIUM CHLORIDE 0.9 % IV SOLN
Freq: Once | INTRAVENOUS | Status: AC
  Administered 2016-09-09: 10:00:00 via INTRAVENOUS

## 2016-09-09 MED ORDER — SODIUM CHLORIDE 0.9 % IV SOLN
2400.0000 mg/m2 | INTRAVENOUS | Status: DC
  Administered 2016-09-09: 5800 mg via INTRAVENOUS
  Filled 2016-09-09: qty 116

## 2016-09-09 MED ORDER — SODIUM CHLORIDE 0.9 % IV SOLN
5.3000 mg/kg | Freq: Once | INTRAVENOUS | Status: AC
  Administered 2016-09-09: 600 mg via INTRAVENOUS
  Filled 2016-09-09: qty 16

## 2016-09-09 MED ORDER — HEPARIN SOD (PORK) LOCK FLUSH 100 UNIT/ML IV SOLN
500.0000 [IU] | Freq: Once | INTRAVENOUS | Status: DC | PRN
  Filled 2016-09-09: qty 5

## 2016-09-09 MED ORDER — DEXAMETHASONE SODIUM PHOSPHATE 10 MG/ML IJ SOLN
INTRAMUSCULAR | Status: AC
  Filled 2016-09-09: qty 1

## 2016-09-09 MED ORDER — PALONOSETRON HCL INJECTION 0.25 MG/5ML
0.2500 mg | Freq: Once | INTRAVENOUS | Status: AC
  Administered 2016-09-09: 0.25 mg via INTRAVENOUS

## 2016-09-09 NOTE — Progress Notes (Signed)
[name]    HEMATOLOGY/ONCOLOGY CLINIC NOTE  Date of Service: .09/09/2016  Patient Care Team: [name], MD as PCP - General (Family Medicine) [name] M.D. (general surgery)  CHIEF COMPLAINTS/PURPOSE OF CONSULTATION:  Post hospitalization followup for colon cancer  HISTORY OF PRESENTING ILLNESS:   Scott Gallagher is a wonderful 46 y.o. male who has been referred to [place] by Dr .[name], MD for evaluation and management of newly diagnosed colon cancer likely with metastases to the liver.  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 He noted some fatigue but no other acute symptoms or overt GI bleeding.  Patient required transfusion of 2 units of PRBCs and IV iron. He subsequently had a colonoscopy which showed a near obstructing mass of his ascending colon close to the cecum which appeared concerning for colon cancer.Biopsy showed atleast high grade adenomatous dysplasia. CT chest /abd/pelvis was done on 06/08/2016 and showed 3.7 cm in length area of narrowing of the ascending colon, which lies 2 cm above the ileocecal junction, initially felt to likely reflect an area of spasm, but consistent with a constricting lesion. There are 2 adjacent sub cm shotty mesenteric lymph nodes, the largest measuring 9 mm in short axis.12 mm low-density lesion in the anterior segment of the right lobe. Liver otherwise unremarkable.  Patient underwent laparoscopic assisted rt hemicolectomy on 06/17/2016 , liver was surveyed and an umbilicated nodule was noted on the rt lobe of liver and a biopsy of this was done that revealed adenocarcinoma on frozen section.  Was seen in the hospital and was referred to continue follow-up with Dr. Marin Olp but chooses to continue  follow-up here since it is close to where he lives and that it would be more convenient to him.   We discussed his pathology results and details. We discussed the need for a PET/CT scan towards the end of the month to have an accurate baseline staging and more specifically to evaluate residual disease in his liver and other sites since this will determine whether he has isolated oligo metastatic disease in the liver or if it is more widespread.  We discussed the rationale, attention adverse effects, advantages, alternatives to FOLFOX chemotherapy. We discussed the role of port placement. We discussed the specifics of chemotherapy and all his questions were answered in great details.   He is currently healing very well after surgery with incisions having close up nicely. Mild postoperative discomfort but good bowel movements and no other acute issues. No evidence of rectal bleeding.  INTERVAL HISTORY  Patient is here for follow-up prior to his 5th cycle of FOLFOX . He was seen by Dr. Barry Dienes and has been referred to Dr. Clovis Riley at [facility] for consideration of liver metastatectomy/ablation and if needed hipec - he has an appointment on 09/22/2016. Patient has been doing well his anemia has resolved with iron replacement. We discussed and informed consent was obtained for Avastin starting today in addition to FOLFOX. No other acute toxicities from his last cycle of chemotherapy. Intermittent mild diarrhea. Tolerating oral iron. No tingling or numbness in his hands or feet. No evidence of GI bleeding.   MEDICAL HISTORY:  Past Medical History:  Diagnosis Date  . Anemia   . Arthritis   .  Cancer ([place])   . Diabetes mellitus without complication (HCC)    diet controlled  . History of blood transfusion   . Hyperlipidemia   . Sleep apnea    cpap  . Wears glasses     SURGICAL HISTORY: Past Surgical History:  Procedure Laterality Date  . COLON SURGERY    . LAPAROSCOPIC RIGHT HEMI  COLECTOMY Right 06/17/2016   Procedure: LAPAROSCOPIC ASSISTED  RIGHT HEMI COLECTOMY;  Surgeon: [name], MD;  Location: [facility];  Service: General;  Laterality: Right;  . PORTACATH PLACEMENT Left 07/13/2016   Procedure: INSERTION PORT-A-CATH left subclavian;  Surgeon: [name], MD;  Location: [facility];  Service: General;  Laterality: Left;  . SHOULDER ACROMIOPLASTY Right 12/20/2014   Procedure: SHOULDER ACROMIOPLASTY;  Surgeon: Melrose Nakayama, MD;  Location: [place];  Service: Orthopedics;  Laterality: Right;  . SHOULDER ARTHROSCOPY Right 12/20/2014   Procedure: RIGHT ARTHROSCOPY SHOULDER WITH DEBRIDEMENT;  Surgeon: Melrose Nakayama, MD;  Location: [place];  Service: Orthopedics;  Laterality: Right;  . TESTICLE SURGERY     as teen  . TONSILLECTOMY    . WISDOM TOOTH EXTRACTION      SOCIAL HISTORY: Social History   Social History  . Marital status: Married    Spouse name: N/A  . Number of children: N/A  . Years of education: N/A   Occupational History  . cemetery maintenance    Social History Main Topics  . Smoking status: Former Smoker    Packs/day: 1.50    Years: 29.00    Types: Cigarettes    Quit date: 12/16/2012  . Smokeless tobacco: Never Used     Comment: 1-2 ppd from age [age]y to 2014  . Alcohol use 1.8 oz/week    3 Shots of liquor per week     Comment:  some days  . Drug use: No  . Sexual activity: Not on file   Other Topics Concern  . Not on file   Social History Narrative  . No narrative on file    FAMILY HISTORY: Family History  Problem Relation Age of Onset  . Diabetes Other   . Hyperlipidemia Other   . Hypertension Other   . Breast cancer Maternal Aunt 70  . Prostate cancer Maternal Uncle 75  . Lung cancer Paternal Uncle 44  . Stroke Maternal Grandfather 72  . Cancer Paternal Grandmother 23    dx cancer of pancreas and colon, unknown if separate primaries  . Heart Problems Paternal Grandfather     d. 60  .  Prostate cancer Maternal Uncle 43  . Breast cancer Maternal Aunt 75  . Breast cancer Maternal Aunt 68  . Cervical cancer Maternal Aunt 68  . Pancreatic cancer Maternal Aunt 54    d. 58y; heavy smoker  . Colon cancer Paternal Uncle 24    s/p partial colectomy; dx. second colon cancer at age 53-64    ALLERGIES:  is allergic to penicillins.  MEDICATIONS:  Current Outpatient Prescriptions  Medication Sig Dispense Refill  . chlorhexidine (PERIDEX) 0.12 % solution Use as directed 10 mLs in the mouth or throat 3 (three) times daily. 420 mL 0  . dexamethasone (DECADRON) 4 MG tablet Take 2 tablets (8 mg total) by mouth daily. Start the day after chemotherapy for 2 days. Take with food. 30 tablet 1  . lidocaine-prilocaine (EMLA) cream Apply to affected area once 30 g 3  . LORazepam (ATIVAN) 0.5 MG tablet Take 1 tablet (0.5 mg total) by mouth  every 8 (eight) hours. For anxiety or sleep 30 tablet 0  . Multiple Vitamin (MULTIVITAMIN) tablet Take 1 tablet by mouth daily.    . ondansetron (ZOFRAN) 8 MG tablet Take 1 tablet (8 mg total) by mouth 2 (two) times daily as needed for refractory nausea / vomiting. Start on day 3 after chemotherapy. 30 tablet 1  . oxyCODONE-acetaminophen (PERCOCET/ROXICET) 5-325 MG tablet Take 1-2 tablets by mouth every 4 (four) hours as needed for moderate pain. 30 tablet 0  . Probiotic Product (PROBIOTIC PO) Take 1 tablet by mouth daily.    . prochlorperazine (COMPAZINE) 10 MG tablet Take 1 tablet (10 mg total) by mouth every 6 (six) hours as needed (Nausea or vomiting). 30 tablet 1   No current facility-administered medications for this visit.    Facility-Administered Medications Ordered in Other Visits  Medication Dose Route Frequency Provider Last Rate Last Dose  . 0.9 %  sodium chloride infusion   Intravenous Once Brunetta Genera, MD      . bevacizumab (AVASTIN) 600 mg in sodium chloride 0.9 % 100 mL chemo infusion  5.3 mg/kg (Treatment Plan Recorded) Intravenous  Once Brunetta Genera, MD      . dexamethasone (DECADRON) injection 10 mg  10 mg Intravenous Once Brunetta Genera, MD   10 mg at 09/09/16 1044  . dextrose 5 % solution   Intravenous Once Brunetta Genera, MD      . fluorouracil (ADRUCIL) 5,800 mg in sodium chloride 0.9 % 134 mL chemo infusion  2,400 mg/m2 (Treatment Plan Recorded) Intravenous 1 day or 1 dose Brunetta Genera, MD      . fluorouracil (ADRUCIL) chemo injection 950 mg  400 mg/m2 (Treatment Plan Recorded) Intravenous Once Brunetta Genera, MD      . heparin lock flush 100 unit/mL  500 Units Intracatheter Once PRN Brunetta Genera, MD      . leucovorin 964 mg in dextrose 5 % 250 mL infusion  400 mg/m2 (Treatment Plan Recorded) Intravenous Once Brunetta Genera, MD      . oxaliplatin (ELOXATIN) 200 mg in dextrose 5 % 500 mL chemo infusion  84 mg/m2 (Treatment Plan Recorded) Intravenous Once Brunetta Genera, MD      . sodium chloride flush (NS) 0.9 % injection 10 mL  10 mL Intracatheter PRN [place], MD        REVIEW OF SYSTEMS:    10 Point review of Systems was done is negative except as noted above.  PHYSICAL EXAMINATION: ECOG PERFORMANCE STATUS: 1 - Symptomatic but completely ambulatory  . Vitals:   09/09/16 0844  BP: 118/74  Pulse: 76  Resp: 17  Temp: 98.4 F (36.9 C)   Filed Weights   09/09/16 0844  Weight: 256 lb 4.8 oz (116.3 kg)   .Body mass index is 32.91 kg/m. GENERAL:alert, in no acute distress and comfortable SKIN: skin color, texture, turgor are normal, no rashes or significant lesions EYES: normal, conjunctiva are pink and non-injected, sclera clear OROPHARYNX:no exudate, no erythema and lips, buccal mucosa, and tongue normal  NECK: supple, no JVD, thyroid normal size, non-tender, without nodularity LYMPH:  no palpable lymphadenopathy in the cervical, axillary or inguinal LUNGS: clear to auscultation with normal respiratory effort HEART: regular rate & rhythm,  no  murmurs and no lower extremity edema ABDOMEN: abdomen soft, bowel sounds normoactive, healing laparoscopic surgical incisions with no discharge look clean. Musculoskeletal: no cyanosis of digits and no clubbing  PSYCH: alert & oriented x  3 with fluent speech NEURO: no focal motor/sensory deficits   LABORATORY DATA:  I have reviewed the data as listed  . CBC Latest Ref Rng & Units 09/09/2016 08/26/2016 08/12/2016  WBC 4.0 - 10.3 10e3/uL 4.1 4.4 5.9  Hemoglobin 13.0 - 17.1 g/dL 13.4 13.5 12.7(L)  Hematocrit 38.4 - 49.9 % 41.9 41.3 39.4  Platelets 140 - 400 10e3/uL 108(L) 124(L) 161    . CMP Latest Ref Rng & Units 09/09/2016 08/26/2016 08/12/2016  Glucose 70 - 140 mg/dl 126 99 107  BUN 7.0 - 26.0 mg/dL 9.1 10.1 14.3  Creatinine 0.7 - 1.3 mg/dL 1.0 1.1 1.0  Sodium 136 - 145 mEq/L 141 142 139  Potassium 3.5 - 5.1 mEq/L 4.6 4.7 4.4  Chloride 101 - 111 mmol/L - - -  CO2 22 - 29 mEq/L 25 25 25   Calcium 8.4 - 10.4 mg/dL 9.8 9.7 9.3  Total Protein 6.4 - 8.3 g/dL 7.0 7.2 7.0  Total Bilirubin 0.20 - 1.20 mg/dL 0.42 0.40 0.32  Alkaline Phos 40 - 150 U/L 90 93 91  AST 5 - 34 U/L 34 42(H) 38(H)  ALT 0 - 55 U/L 42 59(H) 56(H)       Microscopic Comment 2. COLON AND RECTUM (INCLUDING TRANS-ANAL RESECTION): Specimen: Terminal ileum, right colon and appendix Procedure: Segmental resection Tumor site: Proximal ascending colon Specimen integrity: Intact Macroscopic intactness of mesorectum: Not applicable: x Complete: NA Near complete: NA Incomplete: NA Cannot be determined (specify): NA Macroscopic tumor perforation: The mass invades through muscularis propria into pericolonic soft tissue Invasive tumor: Maximum size: 5.5 cm Histologic type(s): Adenocarcinoma Histologic grade and differentiation: G3 G1: well differentiated/low grade 1 of 4 Supplemental copy SUPPLEMENTAL for Eichelberger, [place] ([id number]) Microscopic Comment(continued) G2: moderately differentiated/low grade G3:  poorly differentiated/high grade G4: undifferentiated/high grade Type of polyp in which invasive carcinoma arose: Tubular adenoma Microscopic extension of invasive tumor: The mass invade through the muscularis propria into pericolonic soft tissue Lymph-Vascular invasion: Identified Peri-neural invasion: Identified Tumor deposit(s) (discontinuous extramural extension): Present Resection margins: Proximal margin: Negative Distal margin: Negative Circumferential (radial) (posterior ascending, posterior descending; lateral and posterior mid-rectum; and entire lower 1/3 rectum):Negative Mesenteric margin (sigmoid and transverse): NA Distance closest margin (if all above margins negative): 3.7 cm from the non peritonealized pericolic soft tissue margin Trans-anal resection margins only: Deep margin: NA Mucosal Margin: NA Distance closest mucosal margin (if negative): NA Treatment effect (neo-adjuvant therapy): NA Additional polyp(s): Negative Non-neoplastic findings: Unremarkable Lymph nodes: number examined 18; number positive: 5 Pathologic Staging: pT3, N2a, M1a Ancillary studies: MSI ordered   RADIOGRAPHIC STUDIES: I have personally reviewed the radiological images as listed and agreed with the findings in the report. Dg Orthopantogram  Result Date: 08/26/2016 CLINICAL DATA:  RIGHT upper molar pain for 2-3 days, question jaw abscess, history of prior root canal on a RIGHT upper molar, metastatic colon cancer to liver EXAM: ORTHOPANTOGRAM/PANORAMIC COMPARISON:  None FINDINGS: No definite periodontal lucency seen to suggest abscess, though the bases of the RIGHT maxillary molars/premolar are suboptimally visualized. Scattered dental fillings and evidence of prior RIGHT maxillary molar root canal. No mandibular fracture or bone destruction. IMPRESSION: No definite periodontal lucencies to suggest abscess. If this remains a persistent clinical concern, recommend CT. Electronically Signed    By: Lavonia Dana M.D.   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 KRAS mutated BRAF, NRAS mutation neg MSI Stable.  PET/CT scan done on 06/25/2016 Show multiple foci of hypermetabolic hepatic metastases (atleast 4-5) and 2 hypermetabolic peritoneal nodules along the dorsal peritoneal surface concerning for peritoneal metastases.  MRI Liver 07/27/2016 - Multiple small liver metastases throughout the right and left hepatic lobes, largest measuring 2.1 cm. 1.2 cm enhancing peritoneal nodule in left paracolic gutter, suspicious for peritoneal metastasis. Other small peritoneal nodules better visualized on recent PET-CT which showed hypermetabolic activity, also suspicious for peritoneal metastases.    PLAN -Patient tolerated the first 4 cycles of FOLFOX without any prohibitive toxicities. -Patient has seeing Dr. Barry Dienes for evaluation to determine resectability of his liver +/-peritoneal lesions and has been referred to Dr. Clovis Riley at [facility] for input regarding management of his peritoneal disease and liver metastases and has appointment on 09/22/2016 -we will add Avastin from this cycle in addition to FOLFOX -genetic counsellor evaluation done --pending result of 34 gene panel  #2 Rt upper jaw dental pain --periodonitis r/o dental abscess - resolved, orthopantogram with no abscess. Plan -peridex mouthwash QID   #3. 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 Lab Results  Component Value Date   IRON 358 (H) 08/26/2016   TIBC 480 (H) 08/26/2016   IRONPCTSAT 75 (H) 08/26/2016   (Iron and TIBC)  Lab Results  Component Value Date   FERRITIN 80 08/26/2016   Plan -Patient's  iron levels continue to improve and have nearly normalized . --continue oral ferrous sulfate 1 tablet by mouth daily with a multivitamin - will discontinune once ferritin close to 100   #4  Patient Active Problem List   Diagnosis Date Noted  . Family history of colon cancer 07/24/2016  . Family history of prostate cancer 07/24/2016  . Metastatic colon cancer to liver ([place]) 07/01/2016  . Right Colon cancer metastasized to liver ([place]) 06/17/2016  . Chronic GI bleeding 05/29/2016  . Diabetes mellitus type 2, diet-controlled ([place]) 05/22/2016  . OSA (obstructive sleep apnea) 05/22/2016  . Hyperlipidemia 05/22/2016  . Anemia due to blood loss, chronic   . Anemia 05/21/2016   -Continue follow-up with primary care physician .Gara Kroner, MD for optimization of diabetes management . Might need basal insulin if his blood sugars are elevated with chemotherapy/ steroids . -Continue use of CPAP for sleep apnea . -off statins  -continue FOLFOX + Avastin  -RTC with Dr Irene Limbo in 2 weeks with labs C6D1 to determine any treatment changes based on recommendation from evaluation at [facility]   All of the patients questions were answered with apparent satisfaction. The patient knows to call the clinic with any problems, questions or concerns.  I spent 20 minutes counseling the patient face to face. The total time spent in the appointment was 25 minutes and more than 50% was on counseling and direct patient cares.    Sullivan Lone MD [place] AAHIVMS [facility] Hematology/Oncology Physician [facility]  (Office):       [phone] (Work cell):  [phone] (Fax):           [phone]

## 2016-09-09 NOTE — Patient Instructions (Signed)
[facility] Discharge Instructions for Patients Receiving Chemotherapy  Today you received the following chemotherapy agents: Oxaliplatin, Leucovorin, Fluorouracil, Avastin   To help prevent nausea and vomiting after your treatment, we encourage you to take your nausea medication as prescribed.    If you develop nausea and vomiting that is not controlled by your nausea medication, call the clinic.   BELOW ARE SYMPTOMS THAT SHOULD BE REPORTED IMMEDIATELY:  *FEVER GREATER THAN 100.5 F  *CHILLS WITH OR WITHOUT FEVER  NAUSEA AND VOMITING THAT IS NOT CONTROLLED WITH YOUR NAUSEA MEDICATION  *UNUSUAL SHORTNESS OF BREATH  *UNUSUAL BRUISING OR BLEEDING  TENDERNESS IN MOUTH AND THROAT WITH OR WITHOUT PRESENCE OF ULCERS  *URINARY PROBLEMS  *BOWEL PROBLEMS  UNUSUAL RASH Items with * indicate a potential emergency and should be followed up as soon as possible.  Feel free to call the clinic you have any questions or concerns. The clinic phone number is [phone].  Please show the [place] at check-in to the Emergency Department and triage nurse.'  Bevacizumab injection What is this medicine? BEVACIZUMAB (be va SIZ yoo mab) is a monoclonal antibody. It is used to treat cervical cancer, colorectal cancer, glioblastoma multiforme, non-small cell lung cancer (NSCLC), ovarian cancer, and renal cell cancer. This medicine may be used for other purposes; ask your health care provider or pharmacist if you have questions. COMMON BRAND NAME(S): Avastin What should I tell my health care provider before I take this medicine? They need to know if you have any of these conditions: -blood clots -heart disease, including heart failure, heart attack, or chest pain (angina) -high blood pressure -infection (especially a virus infection such as chickenpox, cold sores, or herpes) -kidney disease -lung disease -prior chemotherapy with doxorubicin, daunorubicin, epirubicin, or  other anthracycline type chemotherapy agents -recent or ongoing radiation therapy -recent surgery -stroke -an unusual or allergic reaction to bevacizumab, hamster proteins, mouse proteins, other medicines, foods, dyes, or preservatives -pregnant or trying to get pregnant -breast-feeding How should I use this medicine? This medicine is for infusion into a vein. It is given by a health care professional in a hospital or clinic setting. Talk to your pediatrician regarding the use of this medicine in children. Special care may be needed. Overdosage: If you think you have taken too much of this medicine contact a poison control center or emergency room at once. NOTE: This medicine is only for you. Do not share this medicine with others. What if I miss a dose? It is important not to miss your dose. Call your doctor or health care professional if you are unable to keep an appointment. What may interact with this medicine? Interactions are not expected. This list may not describe all possible interactions. Give your health care provider a list of all the medicines, herbs, non-prescription drugs, or dietary supplements you use. Also tell them if you smoke, drink alcohol, or use illegal drugs. Some items may interact with your medicine. What should I watch for while using this medicine? Your condition will be monitored carefully while you are receiving this medicine. You will need important blood work and urine testing done while you are taking this medicine. During your treatment, let your health care professional know if you have any unusual symptoms, such as difficulty breathing. This medicine may rarely cause 'gastrointestinal perforation' (holes in the stomach, intestines or colon), a serious side effect requiring surgery to repair. This medicine should be started at least 28 days following major surgery and  the site of the surgery should be totally healed. Check with your doctor before scheduling  dental work or surgery while you are receiving this treatment. Talk to your doctor if you have recently had surgery or if you have a wound that has not healed. Do not become pregnant while taking this medicine or for 6 months after stopping it. Women should inform their doctor if they wish to become pregnant or think they might be pregnant. There is a potential for serious side effects to an unborn child. Talk to your health care professional or pharmacist for more information. Do not breast-feed an infant while taking this medicine. This medicine has caused ovarian failure in some women. This medicine may interfere with the ability to have a child. You should talk to your doctor or health care professional if you are concerned about your fertility. What side effects may I notice from receiving this medicine? Side effects that you should report to your doctor or health care professional as soon as possible: -allergic reactions like skin rash, itching or hives, swelling of the face, lips, or tongue -breathing problems -changes in vision -chest pain -confusion -jaw pain, especially after dental work -mouth sores -seizures -severe abdominal pain -severe headache -signs of decreased platelets or bleeding - bruising, pinpoint red spots on the skin, black, tarry stools, nosebleeds, blood in the urine -signs of infection - fever or chills, cough, sore throat, pain or trouble passing urine -sudden numbness or weakness of the face, arm or leg -swelling of legs or ankles -symptoms of a stroke: change in mental awareness, inability to talk or move one side of the body (especially in patients with lung cancer) -trouble passing urine or change in the amount of urine -trouble speaking or understanding -trouble walking, dizziness, loss of balance or coordination Side effects that usually do not require medical attention (report to your doctor or health care professional if they continue or are  bothersome): -constipation -diarrhea -dry skin -headache -loss of appetite -nausea, vomiting This list may not describe all possible side effects. Call your doctor for medical advice about side effects. You may report side effects to FDA at 1-800-FDA-1088. Where should I keep my medicine? This drug is given in a hospital or clinic and will not be stored at home. NOTE: This sheet is a summary. It may not cover all possible information. If you have questions about this medicine, talk to your doctor, pharmacist, or health care provider.  2017 Elsevier/Gold Standard (2015-07-26 15:28:53)

## 2016-09-09 NOTE — Telephone Encounter (Signed)
Per 1/24 LOS and staff message I have scheduled appts and gave calendar

## 2016-09-09 NOTE — Telephone Encounter (Signed)
Message sent to Chemo scheduler to be added, per 09/09/16 los.  Follow up appointment with Dr Irene Limbo, added for 09/23/16, per 09/09/16 los. Additional appointments scheduled per 09/09/16 los. Patient was given a copy of the AVS report and appointment schedule, per 09/09/16 los.

## 2016-09-11 ENCOUNTER — Ambulatory Visit (HOSPITAL_BASED_OUTPATIENT_CLINIC_OR_DEPARTMENT_OTHER): Payer: 59

## 2016-09-11 VITALS — BP 134/84 | HR 73 | Resp 18

## 2016-09-11 DIAGNOSIS — Z452 Encounter for adjustment and management of vascular access device: Secondary | ICD-10-CM | POA: Diagnosis not present

## 2016-09-11 DIAGNOSIS — C182 Malignant neoplasm of ascending colon: Secondary | ICD-10-CM

## 2016-09-11 DIAGNOSIS — C787 Secondary malignant neoplasm of liver and intrahepatic bile duct: Principal | ICD-10-CM

## 2016-09-11 DIAGNOSIS — C189 Malignant neoplasm of colon, unspecified: Secondary | ICD-10-CM

## 2016-09-11 MED ORDER — HEPARIN SOD (PORK) LOCK FLUSH 100 UNIT/ML IV SOLN
500.0000 [IU] | Freq: Once | INTRAVENOUS | Status: AC | PRN
  Administered 2016-09-11: 500 [IU]
  Filled 2016-09-11: qty 5

## 2016-09-11 MED ORDER — SODIUM CHLORIDE 0.9% FLUSH
10.0000 mL | INTRAVENOUS | Status: DC | PRN
  Administered 2016-09-11: 10 mL
  Filled 2016-09-11: qty 10

## 2016-09-11 NOTE — Patient Instructions (Signed)

## 2016-09-14 DIAGNOSIS — C189 Malignant neoplasm of colon, unspecified: Secondary | ICD-10-CM | POA: Diagnosis not present

## 2016-09-17 DIAGNOSIS — C775 Secondary and unspecified malignant neoplasm of intrapelvic lymph nodes: Secondary | ICD-10-CM | POA: Diagnosis not present

## 2016-09-17 DIAGNOSIS — C182 Malignant neoplasm of ascending colon: Secondary | ICD-10-CM | POA: Diagnosis not present

## 2016-09-17 DIAGNOSIS — G4733 Obstructive sleep apnea (adult) (pediatric): Secondary | ICD-10-CM | POA: Diagnosis not present

## 2016-09-17 DIAGNOSIS — C787 Secondary malignant neoplasm of liver and intrahepatic bile duct: Secondary | ICD-10-CM | POA: Diagnosis not present

## 2016-09-21 DIAGNOSIS — C189 Malignant neoplasm of colon, unspecified: Secondary | ICD-10-CM | POA: Diagnosis not present

## 2016-09-21 DIAGNOSIS — C787 Secondary malignant neoplasm of liver and intrahepatic bile duct: Secondary | ICD-10-CM | POA: Diagnosis not present

## 2016-09-21 DIAGNOSIS — C786 Secondary malignant neoplasm of retroperitoneum and peritoneum: Secondary | ICD-10-CM | POA: Diagnosis not present

## 2016-09-23 ENCOUNTER — Telehealth: Payer: Self-pay | Admitting: Hematology

## 2016-09-23 ENCOUNTER — Other Ambulatory Visit: Payer: Self-pay | Admitting: *Deleted

## 2016-09-23 ENCOUNTER — Ambulatory Visit (HOSPITAL_COMMUNITY): Payer: 59

## 2016-09-23 ENCOUNTER — Other Ambulatory Visit: Payer: 59

## 2016-09-23 ENCOUNTER — Telehealth: Payer: Self-pay | Admitting: *Deleted

## 2016-09-23 ENCOUNTER — Ambulatory Visit (HOSPITAL_BASED_OUTPATIENT_CLINIC_OR_DEPARTMENT_OTHER): Payer: 59 | Admitting: Hematology

## 2016-09-23 ENCOUNTER — Other Ambulatory Visit (HOSPITAL_BASED_OUTPATIENT_CLINIC_OR_DEPARTMENT_OTHER): Payer: 59

## 2016-09-23 ENCOUNTER — Ambulatory Visit: Payer: 59

## 2016-09-23 ENCOUNTER — Encounter: Payer: Self-pay | Admitting: Hematology

## 2016-09-23 VITALS — BP 114/82 | HR 88 | Temp 98.1°F | Resp 18 | Ht 74.0 in | Wt 256.6 lb

## 2016-09-23 DIAGNOSIS — D500 Iron deficiency anemia secondary to blood loss (chronic): Secondary | ICD-10-CM

## 2016-09-23 DIAGNOSIS — L0293 Carbuncle, unspecified: Secondary | ICD-10-CM | POA: Diagnosis not present

## 2016-09-23 DIAGNOSIS — C182 Malignant neoplasm of ascending colon: Secondary | ICD-10-CM

## 2016-09-23 DIAGNOSIS — C189 Malignant neoplasm of colon, unspecified: Secondary | ICD-10-CM | POA: Diagnosis not present

## 2016-09-23 DIAGNOSIS — C786 Secondary malignant neoplasm of retroperitoneum and peritoneum: Secondary | ICD-10-CM

## 2016-09-23 DIAGNOSIS — C787 Secondary malignant neoplasm of liver and intrahepatic bile duct: Secondary | ICD-10-CM

## 2016-09-23 LAB — CBC & DIFF AND RETIC
BASO%: 0.6 % (ref 0.0–2.0)
Basophils Absolute: 0 10*3/uL (ref 0.0–0.1)
EOS ABS: 0.2 10*3/uL (ref 0.0–0.5)
EOS%: 3.1 % (ref 0.0–7.0)
HCT: 43.4 % (ref 38.4–49.9)
HEMOGLOBIN: 13.8 g/dL (ref 13.0–17.1)
IMMATURE RETIC FRACT: 3.9 % (ref 3.00–10.60)
LYMPH%: 28.9 % (ref 14.0–49.0)
MCH: 28 pg (ref 27.2–33.4)
MCHC: 31.8 g/dL — ABNORMAL LOW (ref 32.0–36.0)
MCV: 88.2 fL (ref 79.3–98.0)
MONO#: 1 10*3/uL — ABNORMAL HIGH (ref 0.1–0.9)
MONO%: 20.5 % — ABNORMAL HIGH (ref 0.0–14.0)
NEUT%: 46.9 % (ref 39.0–75.0)
NEUTROS ABS: 2.3 10*3/uL (ref 1.5–6.5)
Platelets: 134 10*3/uL — ABNORMAL LOW (ref 140–400)
RBC: 4.92 10*6/uL (ref 4.20–5.82)
RDW: 20.4 % — ABNORMAL HIGH (ref 11.0–14.6)
RETIC %: 1.68 % (ref 0.80–1.80)
Retic Ct Abs: 82.66 10*3/uL (ref 34.80–93.90)
WBC: 4.9 10*3/uL (ref 4.0–10.3)
lymph#: 1.4 10*3/uL (ref 0.9–3.3)

## 2016-09-23 LAB — COMPREHENSIVE METABOLIC PANEL
ALT: 46 U/L (ref 0–55)
AST: 37 U/L — AB (ref 5–34)
Albumin: 3.7 g/dL (ref 3.5–5.0)
Alkaline Phosphatase: 90 U/L (ref 40–150)
Anion Gap: 10 mEq/L (ref 3–11)
BUN: 11.3 mg/dL (ref 7.0–26.0)
CALCIUM: 9.7 mg/dL (ref 8.4–10.4)
CHLORIDE: 107 meq/L (ref 98–109)
CO2: 24 mEq/L (ref 22–29)
Creatinine: 1 mg/dL (ref 0.7–1.3)
EGFR: >90 mL/min/{1.73_m2} (ref >90)
GLUCOSE: 109 mg/dL (ref 70–140)
POTASSIUM: 4.5 meq/L (ref 3.5–5.1)
SODIUM: 140 meq/L (ref 136–145)
Total Bilirubin: 0.6 mg/dL (ref 0.20–1.20)
Total Protein: 7 g/dL (ref 6.4–8.3)

## 2016-09-23 LAB — FERRITIN: Ferritin: 121 ng/ml (ref 22–316)

## 2016-09-23 LAB — IRON AND TIBC
%SAT: 63 % — AB (ref 20–55)
Iron: 302 ug/dL — ABNORMAL HIGH (ref 42–163)
TIBC: 481 ug/dL — ABNORMAL HIGH (ref 202–409)
UIBC: 179 ug/dL (ref 117–376)

## 2016-09-23 LAB — CHCC SATELLITE - SMEAR

## 2016-09-23 MED ORDER — TRAMADOL HCL 50 MG PO TABS: 50.0000 mg | ORAL_TABLET | Freq: Four times a day (QID) | ORAL | 0 refills | Status: DC | PRN

## 2016-09-23 MED ORDER — CLINDAMYCIN PHOSPHATE 1 % EX GEL: Freq: Two times a day (BID) | CUTANEOUS | 0 refills | Status: DC

## 2016-09-23 NOTE — Progress Notes (Signed)
Pt reports slight pressure/pinch when saline is pushed into port.  Blood aspirated without difficulty.  Dr Irene Limbo notified of findings.

## 2016-09-23 NOTE — Telephone Encounter (Signed)
Message sent to chemo scheduler to be added. Appointments scheduled per 09/23/16 los. Patient was given a copy of the AVS report and appointment schedule per 09/23/16 los.

## 2016-09-23 NOTE — Progress Notes (Signed)
[name]    HEMATOLOGY/ONCOLOGY CLINIC NOTE  Date of Service: .09/23/2016  Patient Care Team: [name], MD as PCP - General (Family Medicine) [name] M.D. 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 KRAS mutated BRAF, NRAS mutation neg MSI Stable.  PET/CT scan done on 06/25/2016 Show multiple foci of hypermetabolic hepatic metastases (atleast 4-5) and 2 hypermetabolic peritoneal nodules along the dorsal peritoneal surface concerning for peritoneal metastases.  MRI Liver 07/27/2016 - Multiple small liver metastases throughout the right and left hepatic lobes, largest measuring 2.1 cm. 1.2 cm enhancing peritoneal nodule in left paracolic gutter, suspicious for peritoneal metastasis. Other small peritoneal nodules better visualized on recent PET-CT which showed hypermetabolic activity, also suspicious for peritoneal metastases.    TREATMENT  FOLFOX x 5 cycles. Avastin added from Cycle 5  INTERVAL HISTORY  Patient is here for follow-up prior to his 6th cycle of FOLFOX . He was seen by Dr. Jyl Heinz at [facility] for consideration of liver metastatectomy/ablation and management of his peritoneal metastases . I discussed the case with Dr. Crisoforo Oxford - he would like to look at the response after this sixth cycle of chemotherapy with a repeat MRI of the liver and CT scan of the abdomen to determine the best approach to metastatectomy and management of the peritoneal disease. We discussed if HAI treatment should be considered and he notes that [facility] is working on trying to get a program here this summer.   [place] notes no acute toxicities from his last  cycle of FOLFOX plus Avastin. Blood pressure is stable. Diarrhea is stable. Cold sensitivity to the fingers but no other persistent neuropathic symptoms. No evidence of GI bleeding.  Notes a small cell uncle/carbuncle on his back about half a centimeter in size without overt purulence. No more dental pain. No issues with mucositis.   MEDICAL HISTORY:  Past Medical History:  Diagnosis Date  . Anemia   . Arthritis   . Cancer ([place])   . Diabetes mellitus without complication (HCC)    diet controlled  . History of blood transfusion   . Hyperlipidemia   . Sleep apnea    cpap  . Wears glasses     SURGICAL HISTORY: Past Surgical History:  Procedure Laterality Date  . COLON SURGERY    . LAPAROSCOPIC RIGHT HEMI COLECTOMY Right 06/17/2016   Procedure: LAPAROSCOPIC ASSISTED  RIGHT HEMI COLECTOMY;  Surgeon: [name], MD;  Location: [facility];  Service: General;  Laterality: Right;  . PORTACATH PLACEMENT Left 07/13/2016   Procedure: INSERTION PORT-A-CATH left subclavian;  Surgeon: [name], MD;  Location: [facility];  Service: General;  Laterality: Left;  . SHOULDER ACROMIOPLASTY Right 12/20/2014   Procedure: SHOULDER ACROMIOPLASTY;  Surgeon: Melrose Nakayama, MD;  Location: [place];  Service: Orthopedics;  Laterality: Right;  . SHOULDER ARTHROSCOPY Right 12/20/2014   Procedure: RIGHT ARTHROSCOPY SHOULDER WITH DEBRIDEMENT;  Surgeon: Melrose Nakayama, MD;  Location: [place];  Service: Orthopedics;  Laterality: Right;  . TESTICLE SURGERY     as teen  . TONSILLECTOMY    . WISDOM TOOTH EXTRACTION      SOCIAL HISTORY: Social History   Social History  . Marital status: Married  Spouse name: N/A  . Number of children: N/A  . Years of education: N/A   Occupational History  . cemetery maintenance    Social History Main Topics  . Smoking status: Former Smoker    Packs/day: 1.50    Years: 29.00    Types: Cigarettes    Quit date: 12/16/2012  .  Smokeless tobacco: Never Used     Comment: 1-2 ppd from age [age]y to 2014  . Alcohol use 1.8 oz/week    3 Shots of liquor per week     Comment:  some days  . Drug use: No  . Sexual activity: Not on file   Other Topics Concern  . Not on file   Social History Narrative  . No narrative on file    FAMILY HISTORY: Family History  Problem Relation Age of Onset  . Diabetes Other   . Hyperlipidemia Other   . Hypertension Other   . Breast cancer Maternal Aunt 70  . Prostate cancer Maternal Uncle 75  . Lung cancer Paternal Uncle 27  . Stroke Maternal Grandfather 72  . Cancer Paternal Grandmother 38    dx cancer of pancreas and colon, unknown if separate primaries  . Heart Problems Paternal Grandfather     d. 27  . Prostate cancer Maternal Uncle 60  . Breast cancer Maternal Aunt 75  . Breast cancer Maternal Aunt 68  . Cervical cancer Maternal Aunt 68  . Pancreatic cancer Maternal Aunt 54    d. 58y; heavy smoker  . Colon cancer Paternal Uncle 6    s/p partial colectomy; dx. second colon cancer at age 56-64    ALLERGIES:  is allergic to penicillins.  MEDICATIONS:  Current Outpatient Prescriptions  Medication Sig Dispense Refill  . clindamycin (CLINDAGEL) 1 % gel Apply topically 2 (two) times daily. 30 g 0  . dexamethasone (DECADRON) 4 MG tablet Take 2 tablets (8 mg total) by mouth daily. Start the day after chemotherapy for 2 days. Take with food. 30 tablet 1  . ferrous sulfate (SM IRON) 325 (65 FE) MG tablet Take 325 mg by mouth.    . lidocaine-prilocaine (EMLA) cream Apply to affected area once 30 g 3  . LORazepam (ATIVAN) 0.5 MG tablet Take 1 tablet (0.5 mg total) by mouth every 8 (eight) hours. For anxiety or sleep 30 tablet 0  . Multiple Vitamin (MULTIVITAMIN) tablet Take 1 tablet by mouth daily.    . ondansetron (ZOFRAN) 8 MG tablet Take 1 tablet (8 mg total) by mouth 2 (two) times daily as needed for refractory nausea / vomiting. Start on day 3 after chemotherapy. 30  tablet 1  . Probiotic Product (PROBIOTIC PO) Take 1 tablet by mouth daily.    . prochlorperazine (COMPAZINE) 10 MG tablet Take 1 tablet (10 mg total) by mouth every 6 (six) hours as needed (Nausea or vomiting). 30 tablet 1  . traMADol (ULTRAM) 50 MG tablet Take 1 tablet (50 mg total) by mouth every 6 (six) hours as needed for moderate pain or severe pain. 30 tablet 0   No current facility-administered medications for this visit.     REVIEW OF SYSTEMS:    10 Point review of Systems was done is negative except as noted above.  PHYSICAL EXAMINATION: ECOG PERFORMANCE STATUS: 1 - Symptomatic but completely ambulatory  . Vitals:   09/23/16 0935  BP: 114/82  Pulse: 88  Resp: 18  Temp: 98.1 F (36.7 C)   Filed Weights   09/23/16 0935  Weight:  256 lb 9.6 oz (116.4 kg)   .Body mass index is 32.95 kg/m. GENERAL:alert, in no acute distress and comfortable SKIN: skin color, texture, turgor are normal, no rashes or significant lesions EYES: normal, conjunctiva are pink and non-injected, sclera clear OROPHARYNX:no exudate, no erythema and lips, buccal mucosa, and tongue normal  NECK: supple, no JVD, thyroid normal size, non-tender, without nodularity LYMPH:  no palpable lymphadenopathy in the cervical, axillary or inguinal LUNGS: clear to auscultation with normal respiratory effort HEART: regular rate & rhythm,  no murmurs and no lower extremity edema ABDOMEN: abdomen soft, bowel sounds normoactive, healing laparoscopic surgical incisions with no discharge look clean. Musculoskeletal: no cyanosis of digits and no clubbing  PSYCH: alert & oriented x 3 with fluent speech NEURO: no focal motor/sensory deficits   LABORATORY DATA:  I have reviewed the data as listed  . CBC Latest Ref Rng & Units 09/23/2016 09/09/2016 08/26/2016  WBC 4.0 - 10.3 10e3/uL 4.9 4.1 4.4  Hemoglobin 13.0 - 17.1 g/dL 13.8 13.4 13.5  Hematocrit 38.4 - 49.9 % 43.4 41.9 41.3  Platelets 140 - 400 10e3/uL 134(L)  108(L) 124(L)    . CMP Latest Ref Rng & Units 09/23/2016 09/09/2016 08/26/2016  Glucose 70 - 140 mg/dl 109 126 99  BUN 7.0 - 26.0 mg/dL 11.3 9.1 10.1  Creatinine 0.7 - 1.3 mg/dL 1.0 1.0 1.1  Sodium 136 - 145 mEq/L 140 141 142  Potassium 3.5 - 5.1 mEq/L 4.5 4.6 4.7  Chloride 101 - 111 mmol/L - - -  CO2 22 - 29 mEq/L _0 Calcium 8.4 - 10.4 mg/dL 9.7 9.8 9.7  Total Protein 6.4 - 8.3 g/dL 7.0 7.0 7.2  Total Bilirubin 0.20 - 1.20 mg/dL 0.60 0.42 0.40  Alkaline Phos 40 - 150 U/L 90 90 93  AST 5 - 34 U/L 37(H) 34 42(H)  ALT 0 - 55 U/L 46 42 59(H)       Microscopic Comment 2. COLON AND RECTUM (INCLUDING TRANS-ANAL RESECTION): Specimen: Terminal ileum, right colon and appendix Procedure: Segmental resection Tumor site: Proximal ascending colon Specimen integrity: Intact Macroscopic intactness of mesorectum: Not applicable: x Complete: NA Near complete: NA Incomplete: NA Cannot be determined (specify): NA Macroscopic tumor perforation: The mass invades through muscularis propria into pericolonic soft tissue Invasive tumor: Maximum size: 5.5 cm Histologic type(s): Adenocarcinoma Histologic grade and differentiation: G3 G1: well differentiated/low grade 1 of 4 Supplemental copy SUPPLEMENTAL for Slawson, K[place] ([id number]) Microscopic Comment(continued) G2: moderately differentiated/low grade G3: poorly differentiated/high grade G4: undifferentiated/high grade Type of polyp in which invasive carcinoma arose: Tubular adenoma Microscopic extension of invasive tumor: The mass invade through the muscularis propria into pericolonic soft tissue Lymph-Vascular invasion: Identified Peri-neural invasion: Identified Tumor deposit(s) (discontinuous extramural extension): Present Resection margins: Proximal margin: Negative Distal margin: Negative Circumferential (radial) (posterior ascending, posterior descending; lateral and posterior mid-rectum; and entire lower 1/3  rectum):Negative Mesenteric margin (sigmoid and transverse): NA Distance closest margin (if all above margins negative): 3.7 cm from the non peritonealized pericolic soft tissue margin Trans-anal resection margins only: Deep margin: NA Mucosal Margin: NA Distance closest mucosal margin (if negative): NA Treatment effect (neo-adjuvant therapy): NA Additional polyp(s): Negative Non-neoplastic findings: Unremarkable Lymph nodes: number examined 18; number positive: 5 Pathologic Staging: pT3, N2a, M1a Ancillary studies: MSI ordered   RADIOGRAPHIC STUDIES: I have personally reviewed the radiological images as listed and agreed with the findings in the report. Dg Orthopantogram  Result Date: 08/26/2016 CLINICAL DATA:  RIGHT upper molar pain for 2-3  days, question jaw abscess, history of prior root canal on a RIGHT upper molar, metastatic colon cancer to liver EXAM: ORTHOPANTOGRAM/PANORAMIC COMPARISON:  None FINDINGS: No definite periodontal lucency seen to suggest abscess, though the bases of the RIGHT maxillary molars/premolar are suboptimally visualized. Scattered dental fillings and evidence of prior RIGHT maxillary molar root canal. No mandibular fracture or bone destruction. IMPRESSION: No definite periodontal lucencies to suggest abscess. If this remains a persistent clinical concern, recommend CT. Electronically Signed   By: Lavonia Dana M.D.   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 KRAS mutated BRAF, NRAS mutation neg MSI Stable.  PET/CT scan done on 06/25/2016 Show multiple foci of hypermetabolic hepatic metastases (atleast 4-5) and 2 hypermetabolic peritoneal nodules along the dorsal peritoneal surface concerning for peritoneal metastases.  MRI Liver 07/27/2016  - Multiple small liver metastases throughout the right and left hepatic lobes, largest measuring 2.1 cm. 1.2 cm enhancing peritoneal nodule in left paracolic gutter, suspicious for peritoneal metastasis. Other small peritoneal nodules better visualized on recent PET-CT which showed hypermetabolic activity, also suspicious for peritoneal metastases.    PLAN -Patient tolerated the first 5 cycles of FOLFOX along with Avastin (from Cycle 5) without any prohibitive toxicities. -Patient has seen Dr Crisoforo Oxford at [facility] to determine appropriate surgical management of  his liver +/-peritoneal lesions  -He has been set up for MRI of the liver and CT of the abdomen and pelvis in March 2018 with Dr Crisoforo Oxford at [facility] are stable and we will continue FOLFOX plus Avastin at this time. -Patient recommended to follow up with genetic counsellor.  #2 Rt upper jaw dental pain --periodonitis resolved, orthopantogram with no abscess. -peridex mouthwash QID   #3. 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 Lab Results  Component Value Date   IRON 302 (H) 09/23/2016   TIBC 481 (H) 09/23/2016   IRONPCTSAT 63 (H) 09/23/2016   (Iron and TIBC)  Lab Results  Component Value Date   FERRITIN 121 09/23/2016   Plan -We will discontinue his oral iron replacement at this time.  #4 small furuncle/carbuncle on his back Plan -Recommended warm compresses and gently washing with soap and water. -Topical clindamycin 2-3 times daily - counseled him to call us for oral antibiotics if his furuncle/carbuncle get bigger or there is signs of increasing redness or pain around the area.  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 Family history of colon cancer 07/24/2016  . Family history of prostate cancer 07/24/2016  . Metastatic colon cancer to liver ([place]) 07/01/2016  . Right Colon  cancer metastasized to liver ([place]) 06/17/2016  . Chronic GI bleeding 05/29/2016  . Diabetes mellitus type 2, diet-controlled ([place]) 05/22/2016  . OSA (obstructive sleep apnea) 05/22/2016  . Hyperlipidemia 05/22/2016  . Anemia due to blood loss, chronic   . Anemia 05/21/2016   -Continue follow-up with primary care physician .Gara Kroner, MD for optimization of diabetes management . Might need basal insulin if his blood sugars are elevated with chemotherapy/ steroids . -Continue use of CPAP for sleep apnea . -off statins  -continue FOLFOX + Avastin  As per plan -  f/u for imaging at [facility] as per schedule -RTC with Dr Irene Limbo in 4 weeks with C8D1 Continue labs q2weeks prior to chemotherapy.   All of the patients questions were answered with apparent satisfaction. The patient knows to call the clinic with any problems, questions or concerns.  I spent 20 minutes counseling the patient face to face. The total time spent in the appointment was 25 minutes and more than 50% was on counseling and direct patient cares.    Sullivan Lone MD [place] AAHIVMS [facility] Hematology/Oncology Physician [facility]  (Office):       [phone] (Work cell):  [phone] (Fax):           [phone]

## 2016-09-23 NOTE — Telephone Encounter (Signed)
Per 2/7 LOS and staff message I have scheduled appt and notified the scheduler

## 2016-09-23 NOTE — Progress Notes (Signed)
Patient scheduled for dye study [facility]) today @ 1530. Will need to return tomorrow for chemo and pump d/c on Saturday. Patient notified of plan of care. Verbalizes understanding and agrees to plan. Will call patient when new tx appt has been scheduled.

## 2016-09-24 ENCOUNTER — Telehealth: Payer: Self-pay | Admitting: Genetic Counselor

## 2016-09-24 ENCOUNTER — Ambulatory Visit (HOSPITAL_COMMUNITY)
Admission: RE | Admit: 2016-09-24 | Discharge: 2016-09-24 | Disposition: A | Discharge status: Home / Self Care | Payer: 59 | Source: Ambulatory Visit | Attending: Hematology | Admitting: Hematology

## 2016-09-24 ENCOUNTER — Other Ambulatory Visit: Payer: Self-pay | Admitting: *Deleted

## 2016-09-24 ENCOUNTER — Ambulatory Visit (HOSPITAL_BASED_OUTPATIENT_CLINIC_OR_DEPARTMENT_OTHER): Payer: 59

## 2016-09-24 ENCOUNTER — Encounter (HOSPITAL_COMMUNITY): Payer: Self-pay | Admitting: Interventional Radiology

## 2016-09-24 ENCOUNTER — Other Ambulatory Visit: Payer: Self-pay | Admitting: Hematology

## 2016-09-24 VITALS — BP 108/81 | HR 82 | Temp 98.2°F | Resp 20

## 2016-09-24 DIAGNOSIS — C189 Malignant neoplasm of colon, unspecified: Secondary | ICD-10-CM

## 2016-09-24 DIAGNOSIS — Z452 Encounter for adjustment and management of vascular access device: Secondary | ICD-10-CM | POA: Diagnosis not present

## 2016-09-24 DIAGNOSIS — C182 Malignant neoplasm of ascending colon: Secondary | ICD-10-CM | POA: Diagnosis not present

## 2016-09-24 DIAGNOSIS — Y831 Surgical operation with implant of artificial internal device as the cause of abnormal reaction of the patient, or of later complication, without mention of misadventure at the time of the procedure: Secondary | ICD-10-CM | POA: Diagnosis not present

## 2016-09-24 DIAGNOSIS — Z5111 Encounter for antineoplastic chemotherapy: Secondary | ICD-10-CM

## 2016-09-24 DIAGNOSIS — Z5112 Encounter for antineoplastic immunotherapy: Secondary | ICD-10-CM | POA: Diagnosis not present

## 2016-09-24 DIAGNOSIS — C787 Secondary malignant neoplasm of liver and intrahepatic bile duct: Principal | ICD-10-CM

## 2016-09-24 DIAGNOSIS — T82898A Other specified complication of vascular prosthetic devices, implants and grafts, initial encounter: Secondary | ICD-10-CM | POA: Insufficient documentation

## 2016-09-24 HISTORY — PX: IR GENERIC HISTORICAL: IMG1180011

## 2016-09-24 MED ORDER — IOPAMIDOL (ISOVUE-300) INJECTION 61%
INTRAVENOUS | Status: AC
  Filled 2016-09-24: qty 50

## 2016-09-24 MED ORDER — FLUOROURACIL CHEMO INJECTION 2.5 GM/50ML
400.0000 mg/m2 | Freq: Once | INTRAVENOUS | Status: AC
  Administered 2016-09-24: 950 mg via INTRAVENOUS
  Filled 2016-09-24: qty 19

## 2016-09-24 MED ORDER — DEXTROSE 5 % IV SOLN
Freq: Once | INTRAVENOUS | Status: AC
Start: 2016-09-24 — End: 2016-09-24
  Administered 2016-09-24: 15:00:00 via INTRAVENOUS

## 2016-09-24 MED ORDER — SODIUM CHLORIDE 0.9 % IV SOLN
5.2000 mg/kg | Freq: Once | INTRAVENOUS | Status: AC
  Administered 2016-09-24: 600 mg via INTRAVENOUS
  Filled 2016-09-24: qty 8

## 2016-09-24 MED ORDER — PALONOSETRON HCL INJECTION 0.25 MG/5ML
0.2500 mg | Freq: Once | INTRAVENOUS | Status: AC
  Administered 2016-09-24: 0.25 mg via INTRAVENOUS

## 2016-09-24 MED ORDER — PALONOSETRON HCL INJECTION 0.25 MG/5ML
INTRAVENOUS | Status: AC
  Filled 2016-09-24: qty 5

## 2016-09-24 MED ORDER — DEXTROSE 5 % IV SOLN
83.0000 mg/m2 | Freq: Once | INTRAVENOUS | Status: AC
  Administered 2016-09-24: 200 mg via INTRAVENOUS
  Filled 2016-09-24: qty 40

## 2016-09-24 MED ORDER — DEXAMETHASONE SODIUM PHOSPHATE 10 MG/ML IJ SOLN
10.0000 mg | Freq: Once | INTRAMUSCULAR | Status: AC
  Administered 2016-09-24: 10 mg via INTRAVENOUS

## 2016-09-24 MED ORDER — DEXAMETHASONE SODIUM PHOSPHATE 10 MG/ML IJ SOLN
INTRAMUSCULAR | Status: AC
  Filled 2016-09-24: qty 1

## 2016-09-24 MED ORDER — LEUCOVORIN CALCIUM INJECTION 350 MG
400.0000 mg/m2 | Freq: Once | INTRAVENOUS | Status: AC
  Administered 2016-09-24: 964 mg via INTRAVENOUS
  Filled 2016-09-24: qty 48.2

## 2016-09-24 MED ORDER — SODIUM CHLORIDE 0.9 % IV SOLN: Freq: Once | INTRAVENOUS | Status: DC

## 2016-09-24 MED ORDER — FLUOROURACIL CHEMO INJECTION 5 GM/100ML
2400.0000 mg/m2 | INTRAVENOUS | Status: DC
  Filled 2016-09-24: qty 116

## 2016-09-24 MED ORDER — SODIUM CHLORIDE 0.9 % IV SOLN
2400.0000 mg/m2 | INTRAVENOUS | Status: DC
  Administered 2016-09-24: 5800 mg via INTRAVENOUS
  Filled 2016-09-24: qty 116

## 2016-09-24 NOTE — Telephone Encounter (Signed)
Explained to patient that the sample had been placed on an accidental hold and was not started.  This was just realized and is going to be prioritized through the lab.  The patient voiced his understanding that testing will take an additional 3-4 weeks.

## 2016-09-24 NOTE — Patient Instructions (Signed)
[facility] Discharge Instructions for Patients Receiving Chemotherapy  Today you received the following chemotherapy agents: Oxaliplatin, Leucovorin, Fluorouracil and Avastin  To help prevent nausea and vomiting after your treatment, we encourage you to take your nausea medication as prescribed.    If you develop nausea and vomiting that is not controlled by your nausea medication, call the clinic.   BELOW ARE SYMPTOMS THAT SHOULD BE REPORTED IMMEDIATELY:  *FEVER GREATER THAN 100.5 F  *CHILLS WITH OR WITHOUT FEVER  NAUSEA AND VOMITING THAT IS NOT CONTROLLED WITH YOUR NAUSEA MEDICATION  *UNUSUAL SHORTNESS OF BREATH  *UNUSUAL BRUISING OR BLEEDING  TENDERNESS IN MOUTH AND THROAT WITH OR WITHOUT PRESENCE OF ULCERS  *URINARY PROBLEMS  *BOWEL PROBLEMS  UNUSUAL RASH Items with * indicate a potential emergency and should be followed up as soon as possible.  Feel free to call the clinic you have any questions or concerns. The clinic phone number is [phone].  Please show the [facility] at check-in to the Emergency Department and triage nurse.

## 2016-09-24 NOTE — Procedures (Signed)
1.  Port injection: fracture at costoclavicular ligament. 2.  R arm PowerPICC placed under [place] and fluoroscopy No ptx on spot chest radiograph. No complication No blood loss. See complete dictation in [facility].

## 2016-09-24 NOTE — Progress Notes (Signed)
Patient to receive avastin today despite PICC line placement today and PAC placement this coming Wednesday. VO/ Dr. Marjory Sneddon RN. VO to increase rate of 12fu pump to end earlier on Saturday @ 1pm  /Dr. Marjory Sneddon RN

## 2016-09-25 ENCOUNTER — Ambulatory Visit (HOSPITAL_BASED_OUTPATIENT_CLINIC_OR_DEPARTMENT_OTHER): Payer: 59

## 2016-09-25 DIAGNOSIS — Z452 Encounter for adjustment and management of vascular access device: Secondary | ICD-10-CM

## 2016-09-25 DIAGNOSIS — Z95828 Presence of other vascular implants and grafts: Secondary | ICD-10-CM

## 2016-09-25 DIAGNOSIS — C189 Malignant neoplasm of colon, unspecified: Secondary | ICD-10-CM | POA: Diagnosis not present

## 2016-09-25 DIAGNOSIS — D500 Iron deficiency anemia secondary to blood loss (chronic): Secondary | ICD-10-CM

## 2016-09-25 DIAGNOSIS — C787 Secondary malignant neoplasm of liver and intrahepatic bile duct: Secondary | ICD-10-CM

## 2016-09-25 MED ORDER — SODIUM CHLORIDE 0.9% FLUSH
10.0000 mL | INTRAVENOUS | Status: DC | PRN
  Filled 2016-09-25: qty 10

## 2016-09-25 MED ORDER — HEPARIN SOD (PORK) LOCK FLUSH 100 UNIT/ML IV SOLN
500.0000 [IU] | Freq: Once | INTRAVENOUS | Status: DC | PRN
  Filled 2016-09-25: qty 5

## 2016-09-26 ENCOUNTER — Ambulatory Visit (HOSPITAL_BASED_OUTPATIENT_CLINIC_OR_DEPARTMENT_OTHER): Payer: 59

## 2016-09-26 VITALS — BP 130/82 | HR 82 | Temp 98.1°F | Resp 20

## 2016-09-26 DIAGNOSIS — Z5189 Encounter for other specified aftercare: Secondary | ICD-10-CM

## 2016-09-26 DIAGNOSIS — C182 Malignant neoplasm of ascending colon: Secondary | ICD-10-CM | POA: Diagnosis not present

## 2016-09-26 DIAGNOSIS — C189 Malignant neoplasm of colon, unspecified: Secondary | ICD-10-CM

## 2016-09-26 DIAGNOSIS — C787 Secondary malignant neoplasm of liver and intrahepatic bile duct: Principal | ICD-10-CM

## 2016-09-26 MED ORDER — SODIUM CHLORIDE 0.9% FLUSH
10.0000 mL | INTRAVENOUS | Status: DC | PRN
  Administered 2016-09-26: 10 mL
  Filled 2016-09-26: qty 10

## 2016-09-26 MED ORDER — HEPARIN SOD (PORK) LOCK FLUSH 100 UNIT/ML IV SOLN
250.0000 [IU] | Freq: Once | INTRAVENOUS | Status: AC | PRN
Start: 2016-09-26 — End: 2016-09-26
  Administered 2016-09-26: 250 [IU]
  Filled 2016-09-26: qty 5

## 2016-09-26 NOTE — Patient Instructions (Signed)

## 2016-09-28 ENCOUNTER — Other Ambulatory Visit: Payer: Self-pay | Admitting: *Deleted

## 2016-09-28 ENCOUNTER — Ambulatory Visit (HOSPITAL_BASED_OUTPATIENT_CLINIC_OR_DEPARTMENT_OTHER): Payer: 59 | Admitting: Nurse Practitioner

## 2016-09-28 ENCOUNTER — Ambulatory Visit: Payer: 59

## 2016-09-28 ENCOUNTER — Other Ambulatory Visit: Payer: Self-pay | Admitting: Radiology

## 2016-09-28 ENCOUNTER — Encounter: Payer: Self-pay | Admitting: Nurse Practitioner

## 2016-09-28 ENCOUNTER — Telehealth: Payer: Self-pay

## 2016-09-28 VITALS — BP 124/76 | HR 82 | Temp 98.7°F | Resp 18 | Ht 74.0 in | Wt 253.7 lb

## 2016-09-28 DIAGNOSIS — C182 Malignant neoplasm of ascending colon: Secondary | ICD-10-CM | POA: Diagnosis not present

## 2016-09-28 DIAGNOSIS — E119 Type 2 diabetes mellitus without complications: Secondary | ICD-10-CM

## 2016-09-28 DIAGNOSIS — C787 Secondary malignant neoplasm of liver and intrahepatic bile duct: Secondary | ICD-10-CM

## 2016-09-28 DIAGNOSIS — Z452 Encounter for adjustment and management of vascular access device: Secondary | ICD-10-CM

## 2016-09-28 DIAGNOSIS — C189 Malignant neoplasm of colon, unspecified: Secondary | ICD-10-CM

## 2016-09-28 LAB — CBC WITH DIFFERENTIAL/PLATELET
BASO%: 1.2 % (ref 0.0–2.0)
BASOS ABS: 0.1 10*3/uL (ref 0.0–0.1)
EOS ABS: 0.1 10*3/uL (ref 0.0–0.5)
EOS%: 2.5 % (ref 0.0–7.0)
HEMATOCRIT: 43.4 % (ref 38.4–49.9)
HGB: 14.3 g/dL (ref 13.0–17.1)
LYMPH#: 1.7 10*3/uL (ref 0.9–3.3)
LYMPH%: 38.1 % (ref 14.0–49.0)
MCH: 28.6 pg (ref 27.2–33.4)
MCHC: 33 g/dL (ref 32.0–36.0)
MCV: 86.5 fL (ref 79.3–98.0)
MONO#: 0.4 10*3/uL (ref 0.1–0.9)
MONO%: 9.8 % (ref 0.0–14.0)
NEUT#: 2.1 10*3/uL (ref 1.5–6.5)
NEUT%: 48.4 % (ref 39.0–75.0)
PLATELETS: 163 10*3/uL (ref 140–400)
RBC: 5.02 10*6/uL (ref 4.20–5.82)
RDW: 22.6 % — ABNORMAL HIGH (ref 11.0–14.6)
WBC: 4.4 10*3/uL (ref 4.0–10.3)

## 2016-09-28 MED ORDER — PROCHLORPERAZINE MALEATE 10 MG PO TABS: 10.0000 mg | ORAL_TABLET | Freq: Four times a day (QID) | ORAL | 1 refills | Status: DC | PRN

## 2016-09-28 NOTE — Progress Notes (Signed)
SYMPTOM MANAGEMENT CLINIC    Chief Complaint: Scott Gallagher presents today complaining of bleeding at PICC insertion site. PICC was placed last Thursday (09/24/2016) and will be removed on Wednesday   HPI:  Scott Gallagher 46 y.o. male diagnosed with metastatic colon cancer, being treated with FOLFOX/ Bevacizumab returns today for concern over slight bleeding and mild pain at insertion site of PICC.  He will have port placed on Wednesday and PICC removed at that time. PICC site inspected, dressing changed, and PICC was flushed. No oozing at insertion site noted. No swelling or warmth in either upper arm or fore arm. Excellent blood return and flushes easily.   Last chemo received 09/24/2016; next chemo planned for 10/08/2016   Right Colon cancer metastasized to liver [facility])   06/17/2016 Initial Diagnosis    Right Colon cancer metastasized to liver ([place])     07/17/2016 Miscellaneous    [facility] Results received        ROS - negative for nausea, vomiting, constipation, diarrhea, uncontrolled pain or difficulty sleeping.   Past Medical History:  Diagnosis Date  . Anemia   . Arthritis   . Cancer ([place])   . Diabetes mellitus without complication (HCC)    diet controlled  . History of blood transfusion   . Hyperlipidemia   . Sleep apnea    cpap  . Wears glasses     Past Surgical History:  Procedure Laterality Date  . COLON SURGERY    . IR GENERIC HISTORICAL  09/24/2016   IR CV LINE INJECTION 09/24/2016 [facility]  . IR GENERIC HISTORICAL  09/24/2016   IR US GUIDE VASC ACCESS RIGHT 09/24/2016 [facility]  . IR GENERIC HISTORICAL  09/24/2016   IR FLUORO GUIDE CV LINE RIGHT 09/24/2016 [facility]  . LAPAROSCOPIC RIGHT HEMI COLECTOMY Right 06/17/2016   Procedure: LAPAROSCOPIC ASSISTED  RIGHT HEMI COLECTOMY;  Surgeon: [name], MD;  Location: [facility];  Service: General;  Laterality: Right;  . PORTACATH PLACEMENT Left 07/13/2016   Procedure: INSERTION PORT-A-CATH left  subclavian;  Surgeon: [name], MD;  Location: [facility];  Service: General;  Laterality: Left;  . SHOULDER ACROMIOPLASTY Right 12/20/2014   Procedure: SHOULDER ACROMIOPLASTY;  Surgeon: Melrose Nakayama, MD;  Location: [facility] [facility];  Service: Orthopedics;  Laterality: Right;  . SHOULDER ARTHROSCOPY Right 12/20/2014   Procedure: RIGHT ARTHROSCOPY SHOULDER WITH DEBRIDEMENT;  Surgeon: Melrose Nakayama, MD;  Location: [place];  Service: Orthopedics;  Laterality: Right;  . TESTICLE SURGERY     as teen  . TONSILLECTOMY    . WISDOM TOOTH EXTRACTION      has Anemia; Diabetes mellitus type 2, diet-controlled ([place]); OSA (obstructive sleep apnea); Hyperlipidemia; Anemia due to blood loss, chronic; Chronic GI bleeding; Right Colon cancer metastasized to liver [facility]); Metastatic colon cancer to liver [facility]); Family history of colon cancer; Family history of prostate cancer; and Port catheter in place on his problem list.    is allergic to penicillins.  Allergies as of 09/28/2016      Reactions   Penicillins    Was told as a child he was allergic does not know of type of reaction.       Medication List       Accurate as of 09/28/16 10:58 AM. Always use your most recent med list.          clindamycin 1 % gel Commonly known as:  CLINDAGEL Apply topically 2 (two) times daily.   dexamethasone 4  MG tablet Commonly known as:  DECADRON Take 2 tablets (8 mg total) by mouth daily. Start the day after chemotherapy for 2 days. Take with food.   lidocaine-prilocaine cream Commonly known as:  EMLA Apply to affected area once   LORazepam 0.5 MG tablet Commonly known as:  ATIVAN Take 1 tablet (0.5 mg total) by mouth every 8 (eight) hours. For anxiety or sleep   multivitamin tablet Take 1 tablet by mouth daily.   ondansetron 8 MG tablet Commonly known as:  ZOFRAN Take 1 tablet (8 mg total) by mouth 2 (two) times daily as needed for refractory nausea / vomiting. Start on day  3 after chemotherapy.   PROBIOTIC PO Take 1 tablet by mouth daily.   prochlorperazine 10 MG tablet Commonly known as:  COMPAZINE Take 1 tablet (10 mg total) by mouth every 6 (six) hours as needed (Nausea or vomiting).   traMADol 50 MG tablet Commonly known as:  ULTRAM Take 1 tablet (50 mg total) by mouth every 6 (six) hours as needed for moderate pain or severe pain.        PHYSICAL EXAMINATION  Oncology Vitals 09/28/2016 09/26/2016  Height 188 cm -  Weight 115.078 kg -  Weight (lbs) 253 lbs 11 oz -  BMI (kg/m2) 32.57 kg/m2 -  Temp 98.7 98.1  Pulse 82 82  Resp 18 20  SpO2 99 97  BSA (m2) 2.45 m2 -   BP Readings from Last 2 Encounters:  09/28/16 124/76  09/26/16 130/82    Physical Exam  -  Right arm examined; no swelling noted, no warmth or heat, no bleeding at PICC insertion site. Patient is a well nourished, well developed white male in no acute distress.    LABORATORY DATA:. Office Visit on 09/28/2016  Component Date Value Ref Range Status  . WBC 09/28/2016 4.4  4.0 - 10.3 10e3/uL Final  . NEUT# 09/28/2016 2.1  1.5 - 6.5 10e3/uL Final  . HGB 09/28/2016 14.3  13.0 - 17.1 g/dL Final  . HCT 09/28/2016 43.4  38.4 - 49.9 % Final  . Platelets 09/28/2016 163  140 - 400 10e3/uL Final  . MCV 09/28/2016 86.5  79.3 - 98.0 fL Final  . MCH 09/28/2016 28.6  27.2 - 33.4 pg Final  . MCHC 09/28/2016 33.0  32.0 - 36.0 g/dL Final  . RBC 09/28/2016 5.02  4.20 - 5.82 10e6/uL Final  . RDW 09/28/2016 22.6* 11.0 - 14.6 % Final  . lymph# 09/28/2016 1.7  0.9 - 3.3 10e3/uL Final  . MONO# 09/28/2016 0.4  0.1 - 0.9 10e3/uL Final  . Eosinophils Absolute 09/28/2016 0.1  0.0 - 0.5 10e3/uL Final  . Basophils Absolute 09/28/2016 0.1  0.0 - 0.1 10e3/uL Final  . NEUT% 09/28/2016 48.4  39.0 - 75.0 % Final  . LYMPH% 09/28/2016 38.1  14.0 - 49.0 % Final  . MONO% 09/28/2016 9.8  0.0 - 14.0 % Final  . EOS% 09/28/2016 2.5  0.0 - 7.0 % Final  . BASO% 09/28/2016 1.2  0.0 - 2.0 % Final     RADIOGRAPHIC STUDIES: Ir Fluoro Guide Cv Line Right  Result Date: 09/24/2016 CLINICAL DATA:  Leak from left subclavian port catheter, with scheduled revision. Central venous access needed for chemotherapy regimen. EXAM: PICC PLACEMENT WITH ULTRASOUND AND FLUOROSCOPY FLUOROSCOPY TIME:  0.3 minute (86 uGym2 DAP) TECHNIQUE: After written informed consent was obtained, patient was placed in the supine position on angiographic table. Patency of the right basilic vein was confirmed with ultrasound with image  documentation. An appropriate skin site was determined. Skin site was marked. Region was prepped using maximum barrier technique including cap and mask, sterile gown, sterile gloves, large sterile sheet, and Chlorhexidine as cutaneous antisepsis. The region was infiltrated locally with 1% lidocaine. Under real-time ultrasound guidance, the right basilic vein was accessed with a 21 gauge micropuncture needle; the needle tip within the vein was confirmed with ultrasound image documentation. Needle exchanged over a 018 guidewire for a peel-away sheath, through which a 5-French single-lumen power injectable PICC trimmed to 46cm was advanced, positioned with its tip near the cavoatrial junction. Spot chest radiograph confirms appropriate catheter position. Catheter was flushed per protocol and secured externally. The patient tolerated procedure well. COMPLICATIONS: COMPLICATIONS none IMPRESSION: 1. Technically successful five [place] single lumen power injectable PICC placement Electronically Signed   By: Lucrezia Europe M.D.   On: 09/24/2016 14:04   Ir Cv Line Injection  Result Date: 09/24/2016 CLINICAL DATA:  Metastatic colon carcinoma to liver. Port catheter placed surgically 07/13/2016. Patient describes resistance/pain with flush) EXAM: PORT  CATHETER INJECTION UNDER FLUOROSCOPY TECHNIQUE: The procedure, risks (including but not limited to bleeding, infection, organ damage ), benefits, and alternatives were  explained to the patient. Questions regarding the procedure were encouraged and answered. The patient understands and consents to the procedure. Survey fluoroscopic inspection reveals stable position of the left subclavian port catheter. Injection demonstrates partial extravasation at the level of the costoclavicular ligament. There is a partial fibrin sheath around the catheter. Catheter tip remains patent in the distal SVC. IMPRESSION: 1. Fracture of the port catheter at the level of the costoclavicular ligament, with extravasation. The findings were telephoned to the cancer center. PICC line will be placed, and patient scheduled for port revision. Electronically Signed   By: Lucrezia Europe M.D.   On: 09/24/2016 14:03   Ir US Guide Vasc Access Right  Result Date: 09/24/2016 CLINICAL DATA:  Leak from left subclavian port catheter, with scheduled revision. Central venous access needed for chemotherapy regimen. EXAM: PICC PLACEMENT WITH ULTRASOUND AND FLUOROSCOPY FLUOROSCOPY TIME:  0.3 minute (86 uGym2 DAP) TECHNIQUE: After written informed consent was obtained, patient was placed in the supine position on angiographic table. Patency of the right basilic vein was confirmed with ultrasound with image documentation. An appropriate skin site was determined. Skin site was marked. Region was prepped using maximum barrier technique including cap and mask, sterile gown, sterile gloves, large sterile sheet, and Chlorhexidine as cutaneous antisepsis. The region was infiltrated locally with 1% lidocaine. Under real-time ultrasound guidance, the right basilic vein was accessed with a 21 gauge micropuncture needle; the needle tip within the vein was confirmed with ultrasound image documentation. Needle exchanged over a 018 guidewire for a peel-away sheath, through which a 5-French single-lumen power injectable PICC trimmed to 46cm was advanced, positioned with its tip near the cavoatrial junction. Spot chest radiograph confirms  appropriate catheter position. Catheter was flushed per protocol and secured externally. The patient tolerated procedure well. COMPLICATIONS: COMPLICATIONS none IMPRESSION: 1. Technically successful five [place] single lumen power injectable PICC placement Electronically Signed   By: Lucrezia Europe M.D.   On: 09/24/2016 14:04    ASSESSMENT/PLAN:    Bleeding/ mild pain from PICC insertion site.  Platelet count is normal at 163, 000. No oozing or bleeding at site observed today with dressing change. He ranks pain as a 3 or 4 on scale of 1 to 10. Advised to take Tylenol as needed for pain.   Last chemo received 09/24/2016;  next chemo planned for 10/08/2016.   Patient stated understanding of all instructions; and was in agreement with this plan of care. The patient knows to call the clinic with any problems, questions or concerns.   Total time spent with patient was 15 minutes;  with greater than 90 percent of that time spent in face to face counseling regarding patient's symptoms,  and coordination of care and follow up.  Disclaimer:This dictation was prepared with Dragon/digital dictation along with [employer]. Any transcriptional errors that result from this process are unintentional.  Bill Salinas, NP 09/28/2016

## 2016-09-28 NOTE — Telephone Encounter (Signed)
PICC placed Thursday. Port to be replaced on Wednesday. PICC is sore and bleeding a little bit. The biopatch is getting soaked and a little beyond. No redness or swelling that he can see. Soreness started yesterday and increasing today. The soreness is up his arm into his armpit. He will be here about 0930 for [facility]. Inbasket sent.

## 2016-09-29 ENCOUNTER — Other Ambulatory Visit: Payer: Self-pay | Admitting: Student

## 2016-09-30 ENCOUNTER — Ambulatory Visit (HOSPITAL_COMMUNITY)
Admission: RE | Admit: 2016-09-30 | Discharge: 2016-09-30 | Disposition: A | Discharge status: Home / Self Care | Payer: 59 | Source: Ambulatory Visit | Attending: Hematology | Admitting: Hematology

## 2016-09-30 ENCOUNTER — Ambulatory Visit (HOSPITAL_COMMUNITY): Admission: RE | Admit: 2016-09-30 | Payer: 59 | Source: Ambulatory Visit

## 2016-09-30 ENCOUNTER — Encounter (HOSPITAL_COMMUNITY): Payer: Self-pay

## 2016-09-30 ENCOUNTER — Other Ambulatory Visit: Payer: Self-pay | Admitting: Hematology

## 2016-09-30 DIAGNOSIS — E119 Type 2 diabetes mellitus without complications: Secondary | ICD-10-CM | POA: Insufficient documentation

## 2016-09-30 DIAGNOSIS — Y712 Prosthetic and other implants, materials and accessory cardiovascular devices associated with adverse incidents: Secondary | ICD-10-CM | POA: Insufficient documentation

## 2016-09-30 DIAGNOSIS — T82598A Other mechanical complication of other cardiac and vascular devices and implants, initial encounter: Secondary | ICD-10-CM | POA: Insufficient documentation

## 2016-09-30 DIAGNOSIS — Z9221 Personal history of antineoplastic chemotherapy: Secondary | ICD-10-CM | POA: Insufficient documentation

## 2016-09-30 DIAGNOSIS — Z452 Encounter for adjustment and management of vascular access device: Secondary | ICD-10-CM | POA: Diagnosis not present

## 2016-09-30 DIAGNOSIS — G4730 Sleep apnea, unspecified: Secondary | ICD-10-CM | POA: Diagnosis not present

## 2016-09-30 DIAGNOSIS — E785 Hyperlipidemia, unspecified: Secondary | ICD-10-CM | POA: Insufficient documentation

## 2016-09-30 DIAGNOSIS — C787 Secondary malignant neoplasm of liver and intrahepatic bile duct: Principal | ICD-10-CM

## 2016-09-30 DIAGNOSIS — C189 Malignant neoplasm of colon, unspecified: Secondary | ICD-10-CM

## 2016-09-30 DIAGNOSIS — Z880 Allergy status to penicillin: Secondary | ICD-10-CM | POA: Diagnosis not present

## 2016-09-30 DIAGNOSIS — D630 Anemia in neoplastic disease: Secondary | ICD-10-CM | POA: Insufficient documentation

## 2016-09-30 HISTORY — PX: IR GENERIC HISTORICAL: IMG1180011

## 2016-09-30 LAB — CBC WITH DIFFERENTIAL/PLATELET
BASOS PCT: 0 %
Basophils Absolute: 0 10*3/uL (ref 0.0–0.1)
EOS ABS: 0.2 10*3/uL (ref 0.0–0.7)
EOS PCT: 5 %
HCT: 41.3 % (ref 39.0–52.0)
HEMOGLOBIN: 13.7 g/dL (ref 13.0–17.0)
LYMPHS ABS: 1.3 10*3/uL (ref 0.7–4.0)
Lymphocytes Relative: 44 %
MCH: 28.2 pg (ref 26.0–34.0)
MCHC: 33.2 g/dL (ref 30.0–36.0)
MCV: 85 fL (ref 78.0–100.0)
MONO ABS: 0.3 10*3/uL (ref 0.1–1.0)
MONOS PCT: 10 %
NEUTROS PCT: 41 %
Neutro Abs: 1.3 10*3/uL — ABNORMAL LOW (ref 1.7–7.7)
Platelets: 128 10*3/uL — ABNORMAL LOW (ref 150–400)
RBC: 4.86 MIL/uL (ref 4.22–5.81)
RDW: 19.2 % — AB (ref 11.5–15.5)
WBC: 3.1 10*3/uL — ABNORMAL LOW (ref 4.0–10.5)

## 2016-09-30 LAB — PROTIME-INR
INR: 0.95
PROTHROMBIN TIME: 12.7 s (ref 11.4–15.2)

## 2016-09-30 LAB — GLUCOSE, CAPILLARY: Glucose-Capillary: 102 mg/dL — ABNORMAL HIGH (ref 65–99)

## 2016-09-30 MED ORDER — HEPARIN SOD (PORK) LOCK FLUSH 100 UNIT/ML IV SOLN
INTRAVENOUS | Status: AC
Start: 2016-09-30 — End: 2016-09-30
  Filled 2016-09-30: qty 5

## 2016-09-30 MED ORDER — MIDAZOLAM HCL 2 MG/2ML IJ SOLN
INTRAMUSCULAR | Status: AC | PRN
  Administered 2016-09-30: 2 mg via INTRAVENOUS
  Administered 2016-09-30 (×4): 1 mg via INTRAVENOUS

## 2016-09-30 MED ORDER — MIDAZOLAM HCL 2 MG/2ML IJ SOLN
INTRAMUSCULAR | Status: AC
  Filled 2016-09-30: qty 4

## 2016-09-30 MED ORDER — LIDOCAINE-EPINEPHRINE (PF) 2 %-1:200000 IJ SOLN
INTRAMUSCULAR | Status: AC
  Filled 2016-09-30: qty 20

## 2016-09-30 MED ORDER — FENTANYL CITRATE (PF) 100 MCG/2ML IJ SOLN
INTRAMUSCULAR | Status: AC | PRN
  Administered 2016-09-30: 50 ug via INTRAVENOUS
  Administered 2016-09-30 (×2): 25 ug via INTRAVENOUS

## 2016-09-30 MED ORDER — CLINDAMYCIN PHOSPHATE 600 MG/50ML IV SOLN
600.0000 mg | Freq: Once | INTRAVENOUS | Status: AC
  Administered 2016-09-30: 600 mg via INTRAVENOUS
  Filled 2016-09-30: qty 50

## 2016-09-30 MED ORDER — HEPARIN SOD (PORK) LOCK FLUSH 100 UNIT/ML IV SOLN
INTRAVENOUS | Status: AC | PRN
  Administered 2016-09-30: 500 [IU] via INTRAVENOUS

## 2016-09-30 MED ORDER — LIDOCAINE HCL 1 % IJ SOLN
INTRAMUSCULAR | Status: AC | PRN
  Administered 2016-09-30: 10 mL

## 2016-09-30 MED ORDER — LIDOCAINE-EPINEPHRINE (PF) 2 %-1:200000 IJ SOLN
INTRAMUSCULAR | Status: DC | PRN
  Administered 2016-09-30: 10 mL

## 2016-09-30 MED ORDER — LIDOCAINE-EPINEPHRINE (PF) 2 %-1:200000 IJ SOLN
INTRAMUSCULAR | Status: AC | PRN
  Administered 2016-09-30: 10 mL

## 2016-09-30 MED ORDER — FENTANYL CITRATE (PF) 100 MCG/2ML IJ SOLN
INTRAMUSCULAR | Status: AC
  Filled 2016-09-30: qty 4

## 2016-09-30 MED ORDER — SODIUM CHLORIDE 0.9 % IV SOLN
INTRAVENOUS | Status: DC
  Administered 2016-09-30: 08:00:00 via INTRAVENOUS

## 2016-09-30 MED ORDER — LIDOCAINE HCL 1 % IJ SOLN
INTRAMUSCULAR | Status: DC | PRN
  Administered 2016-09-30: 10 mL

## 2016-09-30 MED ORDER — MIDAZOLAM HCL 2 MG/2ML IJ SOLN
INTRAMUSCULAR | Status: AC
  Filled 2016-09-30: qty 6

## 2016-09-30 MED ORDER — LIDOCAINE HCL 1 % IJ SOLN
INTRAMUSCULAR | Status: AC
  Filled 2016-09-30: qty 20

## 2016-09-30 NOTE — Discharge Instructions (Signed)
Incision Care, Adult An incision is a surgical cut that is made through your skin. Most incisions are closed after surgery. Your incision may be closed with stitches (sutures), staples, skin glue, or adhesive strips. You may need to return to your health care provider to have sutures or staples removed. This may occur several days to several weeks after your surgery. The incision needs to be cared for properly to prevent infection. How to care for your incision Incision care   Follow instructions from your health care provider about how to take care of your incision. Make sure you:  Wash your hands with soap and water before you change the bandage (dressing). If soap and water are not available, use hand sanitizer.  Change your dressing as told by your health care provider.  Leave sutures, skin glue, or adhesive strips in place. These skin closures may need to stay in place for 2 weeks or longer. If adhesive strip edges start to loosen and curl up, you may trim the loose edges. Do not remove adhesive strips completely unless your health care provider tells you to do that.  Check your incision area every day for signs of infection. Check for:  More redness, swelling, or pain.  More fluid or blood.  Warmth.  Pus or a bad smell.  Ask your health care provider how to clean the incision. This may include:  Using mild soap and water.  Using a clean towel to pat the incision dry after cleaning it.  Applying a cream or ointment. Do this only as told by your health care provider.  Covering the incision with a clean dressing.  Ask your health care provider when you can leave the incision uncovered.  Do not take baths, swim, or use a hot tub until your health care provider approves. Ask your health care provider if you can take showers. You may only be allowed to take sponge baths for bathing. Medicines  If you were prescribed an antibiotic medicine, cream, or ointment, take or apply the  antibiotic as told by your health care provider. Do not stop taking or applying the antibiotic even if your condition improves.  Take over-the-counter and prescription medicines only as told by your health care provider. General instructions  Limit movement around your incision to improve healing.  Avoid straining, lifting, or exercise for the first month, or for as long as told by your health care provider.  Follow instructions from your health care provider about returning to your normal activities.  Ask your health care provider what activities are safe.  Protect your incision from the sun when you are outside for the first 6 months, or for as long as told by your health care provider. Apply sunscreen around the scar or cover it up.  Keep all follow-up visits as told by your health care provider. This is important. Contact a health care provider if:  Your have more redness, swelling, or pain around the incision.  You have more fluid or blood coming from the incision.  Your incision feels warm to the touch.  You have pus or a bad smell coming from the incision.  You have a fever or shaking chills.  You are nauseous or you vomit.  You are dizzy.  Your sutures or staples come undone. Get help right away if:  You have a red streak coming from your incision.  Your incision bleeds through the dressing and the bleeding does not stop with gentle pressure.  The edges of  your incision open up and separate.  You have severe pain.  You have a rash.  You are confused.  You faint.  You have trouble breathing and a fast heartbeat. This information is not intended to replace advice given to you by your health care provider. Make sure you discuss any questions you have with your health care provider. Document Released: 02/20/2005 Document Revised: 04/10/2016 Document Reviewed: 02/19/2016 Elsevier Interactive Patient Education  2017 [place] An  implanted port is a type of central line that is placed under the skin. Central lines are used to provide IV access when treatment or nutrition needs to be given through a person's veins. Implanted ports are used for long-term IV access. An implanted port may be placed because:   You need IV medicine that would be irritating to the small veins in your hands or arms.   You need long-term IV medicines, such as antibiotics.   You need IV nutrition for a long period.   You need frequent blood draws for lab tests.   You need dialysis.  Implanted ports are usually placed in the chest area, but they can also be placed in the upper arm, the abdomen, or the leg. An implanted port has two main parts:   Reservoir. The reservoir is round and will appear as a small, raised area under your skin. The reservoir is the part where a needle is inserted to give medicines or draw blood.   Catheter. The catheter is a thin, flexible tube that extends from the reservoir. The catheter is placed into a large vein. Medicine that is inserted into the reservoir goes into the catheter and then into the vein.  HOW WILL I CARE FOR MY INCISION SITE? Do not get the incision site wet. Bathe or shower as directed by your health care provider.  HOW IS MY PORT ACCESSED? Special steps must be taken to access the port:   Before the port is accessed, a numbing cream can be placed on the skin. This helps numb the skin over the port site.   Your health care provider uses a sterile technique to access the port.  Your health care provider must put on a mask and sterile gloves.  The skin over your port is cleaned carefully with an antiseptic and allowed to dry.  The port is gently pinched between sterile gloves, and a needle is inserted into the port.  Only "non-coring" port needles should be used to access the port. Once the port is accessed, a blood return should be checked. This helps ensure that the port is in the vein  and is not clogged.   If your port needs to remain accessed for a constant infusion, a clear (transparent) bandage will be placed over the needle site. The bandage and needle will need to be changed every week, or as directed by your health care provider.   Keep the bandage covering the needle clean and dry. Do not get it wet. Follow your health care provider's instructions on how to take a shower or bath while the port is accessed.   If your port does not need to stay accessed, no bandage is needed over the port.  WHAT IS FLUSHING? Flushing helps keep the port from getting clogged. Follow your health care provider's instructions on how and when to flush the port. Ports are usually flushed with saline solution or a medicine called heparin. The need for flushing will depend on how the port  is used.   If the port is used for intermittent medicines or blood draws, the port will need to be flushed:   After medicines have been given.   After blood has been drawn.   As part of routine maintenance.   If a constant infusion is running, the port may not need to be flushed.  HOW LONG WILL MY PORT STAY IMPLANTED? The port can stay in for as long as your health care provider thinks it is needed. When it is time for the port to come out, surgery will be done to remove it. The procedure is similar to the one performed when the port was put in.  WHEN SHOULD I SEEK IMMEDIATE MEDICAL CARE? When you have an implanted port, you should seek immediate medical care if:   You notice a bad smell coming from the incision site.   You have swelling, redness, or drainage at the incision site.   You have more swelling or pain at the port site or the surrounding area.   You have a fever that is not controlled with medicine. This information is not intended to replace advice given to you by your health care provider. Make sure you discuss any questions you have with your health care provider. Document  Released: 08/03/2005 Document Revised: 05/24/2013 Document Reviewed: 04/10/2013 Elsevier Interactive Patient Education  2017 [place] Insertion, Care After Refer to this sheet in the next few weeks. These instructions provide you with information on caring for yourself after your procedure. Your health care provider may also give you more specific instructions. Your treatment has been planned according to current medical practices, but problems sometimes occur. Call your health care provider if you have any problems or questions after your procedure. WHAT TO EXPECT AFTER THE PROCEDURE After your procedure, it is typical to have the following:   Discomfort at the port insertion site. Ice packs to the area will help.  Bruising on the skin over the port. This will subside in 3-4 days. HOME CARE INSTRUCTIONS  After your port is placed, you will get a manufacturer's information card. The card has information about your port. Keep this card with you at all times.   Know what kind of port you have. There are many types of ports available.   Wear a medical alert bracelet in case of an emergency. This can help alert health care workers that you have a port.   The port can stay in for as long as your health care provider believes it is necessary.   A home health care nurse may give medicines and take care of the port.   You or a family member can get special training and directions for giving medicine and taking care of the port at home.  SEEK MEDICAL CARE IF:   Your port does not flush or you are unable to get a blood return.   You have a fever or chills. SEEK IMMEDIATE MEDICAL CARE IF:  You have new fluid or pus coming from your incision.   You notice a bad smell coming from your incision site.   You have swelling, pain, or more redness at the incision or port site.   You have chest pain or shortness of breath. This information is not intended to replace  advice given to you by your health care provider. Make sure you discuss any questions you have with your health care provider. Document Released: 05/24/2013 Document Revised: 08/08/2013 Document Reviewed: 05/24/2013 Elsevier Interactive  Patient Education  2017 [place]. Moderate Conscious Sedation, Adult, Care After These instructions provide you with information about caring for yourself after your procedure. Your health care provider may also give you more specific instructions. Your treatment has been planned according to current medical practices, but problems sometimes occur. Call your health care provider if you have any problems or questions after your procedure. What can I expect after the procedure? After your procedure, it is common:  To feel sleepy for several hours.  To feel clumsy and have poor balance for several hours.  To have poor judgment for several hours.  To vomit if you eat too soon. Follow these instructions at home: For at least 24 hours after the procedure:   Do not:  Participate in activities where you could fall or become injured.  Drive.  Use heavy machinery.  Drink alcohol.  Take sleeping pills or medicines that cause drowsiness.  Make important decisions or sign legal documents.  Take care of children on your own.  Rest. Eating and drinking  Follow the diet recommended by your health care provider.  If you vomit:  Drink water, juice, or soup when you can drink without vomiting.  Make sure you have little or no nausea before eating solid foods. General instructions  Have a responsible adult stay with you until you are awake and alert.  Take over-the-counter and prescription medicines only as told by your health care provider.  If you smoke, do not smoke without supervision.  Keep all follow-up visits as told by your health care provider. This is important. Contact a health care provider if:  You keep feeling nauseous or you keep  vomiting.  You feel light-headed.  You develop a rash.  You have a fever. Get help right away if:  You have trouble breathing. This information is not intended to replace advice given to you by your health care provider. Make sure you discuss any questions you have with your health care provider. Document Released: 05/24/2013 Document Revised: 01/06/2016 Document Reviewed: 11/23/2015 Elsevier Interactive Patient Education  2017 [employer].

## 2016-09-30 NOTE — Procedures (Signed)
Successful removal of left chest portacath.  Left portacath was removed due to dysfunction. Successful placement of right IJ portacath.  Tip at SVC/RA junction.   Removal of right arm PICC. Minimal blood loss and no immediate complication.

## 2016-09-30 NOTE — H&P (Signed)
Referring Physician(s): Brunetta Genera  Supervising Physician: Markus Daft  Patient Status:  [facility]  Chief Complaint:  "I'm getting my port fixed"  Subjective: Pt familiar to IR service from  right upper extremity PICC placement on 09/24/16. He has a history of metastatic colon cancer to the liver, status post surgery and currently receiving chemotherapy. He had a left chest wall Port-A-Cath placed by [facility] in November 2017. Due to recent pain with flushing of the Port-A-Cath he underwent Port-A-Cath injection on 09/24/16 which revealed fracture of the port at the costoclavicular ligament with extravasation. He presents today for Port-A-Cath revision. He currently denies fever, headache, chest pain, dyspnea, cough, abdominal/back pain, vomiting or abnormal bleeding. He does have occasional nausea. Past Medical History:  Diagnosis Date  . Anemia   . Arthritis   . Cancer ([place])   . Diabetes mellitus without complication (HCC)    diet controlled  . History of blood transfusion   . Hyperlipidemia   . Sleep apnea    cpap  . Wears glasses    Past Surgical History:  Procedure Laterality Date  . COLON SURGERY    . IR GENERIC HISTORICAL  09/24/2016   IR CV LINE INJECTION 09/24/2016 [facility]  . IR GENERIC HISTORICAL  09/24/2016   IR US GUIDE VASC ACCESS RIGHT 09/24/2016 [facility]  . IR GENERIC HISTORICAL  09/24/2016   IR FLUORO GUIDE CV LINE RIGHT 09/24/2016 [facility]  . LAPAROSCOPIC RIGHT HEMI COLECTOMY Right 06/17/2016   Procedure: LAPAROSCOPIC ASSISTED  RIGHT HEMI COLECTOMY;  Surgeon: [name], MD;  Location: [facility];  Service: General;  Laterality: Right;  . PORTACATH PLACEMENT Left 07/13/2016   Procedure: INSERTION PORT-A-CATH left subclavian;  Surgeon: [name], MD;  Location: [facility];  Service: General;  Laterality: Left;  . SHOULDER ACROMIOPLASTY Right 12/20/2014   Procedure: SHOULDER ACROMIOPLASTY;  Surgeon: Melrose Nakayama, MD;  Location: [facility];   Service: Orthopedics;  Laterality: Right;  . SHOULDER ARTHROSCOPY Right 12/20/2014   Procedure: RIGHT ARTHROSCOPY SHOULDER WITH DEBRIDEMENT;  Surgeon: Melrose Nakayama, MD;  Location: [facility];  Service: Orthopedics;  Laterality: Right;  . TESTICLE SURGERY     as teen  . TONSILLECTOMY    . WISDOM TOOTH EXTRACTION        Allergies: Penicillins  Medications: Prior to Admission medications   Medication Sig Start Date End Date Taking? Authorizing Provider  LORazepam (ATIVAN) 0.5 MG tablet Take 1 tablet (0.5 mg total) by mouth every 8 (eight) hours. For anxiety or sleep 08/05/16  Yes Brunetta Genera, MD  Multiple Vitamin (MULTIVITAMIN) tablet Take 1 tablet by mouth daily.   Yes Historical Provider, MD  ondansetron (ZOFRAN) 8 MG tablet Take 1 tablet (8 mg total) by mouth 2 (two) times daily as needed for refractory nausea / vomiting. Start on day 3 after chemotherapy. 07/01/16  Yes Brunetta Genera, MD  prochlorperazine (COMPAZINE) 10 MG tablet Take 1 tablet (10 mg total) by mouth every 6 (six) hours as needed (Nausea or vomiting). 09/28/16  Yes Brunetta Genera, MD  clindamycin (CLINDAGEL) 1 % gel Apply topically 2 (two) times daily. 09/23/16   Brunetta Genera, MD  dexamethasone (DECADRON) 4 MG tablet Take 2 tablets (8 mg total) by mouth daily. Start the day after chemotherapy for 2 days. Take with food. 07/01/16   Brunetta Genera, MD  lidocaine-prilocaine (EMLA) cream Apply to affected area once 07/01/16   Brunetta Genera, MD  Probiotic Product (PROBIOTIC  PO) Take 1 tablet by mouth daily.    Historical Provider, MD  traMADol (ULTRAM) 50 MG tablet Take 1 tablet (50 mg total) by mouth every 6 (six) hours as needed for moderate pain or severe pain. 09/23/16   Brunetta Genera, MD     Vital Signs: BP 133/83 (BP Location: Right Arm)   Pulse 83   Temp 98.2 F (36.8 C) (Oral)   Resp 18   SpO2 97%   Physical Exam Awake, alert. Chest clear to auscultation  bilaterally. Clean, intact left chest wall Port-A-Cath. Heart with regular rate and rhythm. Abdomen soft, positive bowel sounds, nontender. Lower extremities with no edema.  Imaging: No results found.  Labs:  CBC:  Recent Labs  09/09/16 0827 09/23/16 0938 09/28/16 1022 09/30/16 0740  WBC 4.1 4.9 4.4 3.1*  HGB 13.4 13.8 14.3 13.7  HCT 41.9 43.4 43.4 41.3  PLT 108* 134* 163 128*    COAGS: No results for input(s): INR, APTT in the last 8760 hours.  BMP:  Recent Labs  05/22/16 0427 05/23/16 0420  06/17/16 1539 06/18/16 0420 07/13/16 1101  08/12/16 0950 08/26/16 1005 09/09/16 0827 09/23/16 0923  NA 141 140  < >  --  136 140  < > 139 142 141 140  K 4.0 4.1  < >  --  4.2 4.2  < > 4.4 4.7 4.6 4.5  CL 110 110  --   --  103 108  --   --   --   --   --   CO2 24 25  < >  --  25 23  < > 25 25 25 24   GLUCOSE 92 103*  < >  --  120* 102*  < > 107 99 126 109  BUN 16 16  < >  --  13 19  < > 14.3 10.1 9.1 11.3  CALCIUM 8.7* 8.8*  < >  --  9.0 9.1  < > 9.3 9.7 9.8 9.7  CREATININE 0.89 0.93  < > 1.29* 0.97 0.88  < > 1.0 1.1 1.0 1.0  GFRNONAA >60 >60  --  >60 >60 >60  --   --   --   --   --   GFRAA >60 >60  --  >60 >60 >60  --   --   --   --   --   < > = values in this interval not displayed.  LIVER FUNCTION TESTS:  Recent Labs  08/12/16 0950 08/26/16 1005 09/09/16 0827 09/23/16 0923  BILITOT 0.32 0.40 0.42 0.60  AST 38* 42* 34 37*  ALT 56* 59* 42 46  ALKPHOS 91 93 90 90  PROT 7.0 7.2 7.0 7.0  ALBUMIN 3.7 3.8 3.7 3.7    Assessment and Plan:  Pt with history of metastatic colon cancer to the liver, status post surgery and currently receiving chemotherapy. He had a left chest wall Port-A-Cath placed by [facility] in November 2017. Due to recent pain with flushing of the Port-A-Cath he underwent Port-A-Cath injection on 09/24/16 which revealed fracture of the port at the costoclavicular ligament with extravasation. He presents today for Port-A-Cath revision.Currently has a right upper  extremity PICC in place.Risks and benefits discussed with the patient/wife including, but not limited to bleeding, infection, pneumothorax, or fibrin sheath development and need for additional procedures. All of the patient's questions were answered, patient is agreeable to proceed.Consent signed and in chart.     Electronically Signed: D. Lennette Bihari Allred 09/30/2016, 8:14 AM  I spent a total of 20 minutes at the the patient's bedside AND on the patient's hospital floor or unit, greater than 50% of which was counseling/coordinating care for Port-A-Cath revision

## 2016-10-06 ENCOUNTER — Other Ambulatory Visit: Payer: Self-pay | Admitting: *Deleted

## 2016-10-06 DIAGNOSIS — C189 Malignant neoplasm of colon, unspecified: Secondary | ICD-10-CM

## 2016-10-06 DIAGNOSIS — C787 Secondary malignant neoplasm of liver and intrahepatic bile duct: Principal | ICD-10-CM

## 2016-10-07 ENCOUNTER — Ambulatory Visit (HOSPITAL_BASED_OUTPATIENT_CLINIC_OR_DEPARTMENT_OTHER): Payer: 59

## 2016-10-07 ENCOUNTER — Other Ambulatory Visit (HOSPITAL_BASED_OUTPATIENT_CLINIC_OR_DEPARTMENT_OTHER): Payer: 59

## 2016-10-07 VITALS — BP 111/83 | HR 78 | Temp 98.5°F | Resp 19

## 2016-10-07 DIAGNOSIS — C787 Secondary malignant neoplasm of liver and intrahepatic bile duct: Principal | ICD-10-CM

## 2016-10-07 DIAGNOSIS — Z5112 Encounter for antineoplastic immunotherapy: Secondary | ICD-10-CM | POA: Diagnosis not present

## 2016-10-07 DIAGNOSIS — Z5111 Encounter for antineoplastic chemotherapy: Secondary | ICD-10-CM | POA: Diagnosis not present

## 2016-10-07 DIAGNOSIS — C182 Malignant neoplasm of ascending colon: Secondary | ICD-10-CM

## 2016-10-07 DIAGNOSIS — C189 Malignant neoplasm of colon, unspecified: Secondary | ICD-10-CM

## 2016-10-07 LAB — COMPREHENSIVE METABOLIC PANEL
ALT: 64 U/L — ABNORMAL HIGH (ref 0–55)
ANION GAP: 10 meq/L (ref 3–11)
AST: 45 U/L — ABNORMAL HIGH (ref 5–34)
Albumin: 3.7 g/dL (ref 3.5–5.0)
Alkaline Phosphatase: 93 U/L (ref 40–150)
BILIRUBIN TOTAL: 0.67 mg/dL (ref 0.20–1.20)
BUN: 8.5 mg/dL (ref 7.0–26.0)
CHLORIDE: 107 meq/L (ref 98–109)
CO2: 22 meq/L (ref 22–29)
Calcium: 9.5 mg/dL (ref 8.4–10.4)
Creatinine: 0.9 mg/dL (ref 0.7–1.3)
Glucose: 151 mg/dl — ABNORMAL HIGH (ref 70–140)
POTASSIUM: 4.2 meq/L (ref 3.5–5.1)
Sodium: 139 mEq/L (ref 136–145)
TOTAL PROTEIN: 6.9 g/dL (ref 6.4–8.3)

## 2016-10-07 LAB — CBC WITH DIFFERENTIAL/PLATELET
BASO%: 0.5 % (ref 0.0–2.0)
BASOS ABS: 0 10*3/uL (ref 0.0–0.1)
EOS ABS: 0.1 10*3/uL (ref 0.0–0.5)
EOS%: 3.1 % (ref 0.0–7.0)
HCT: 40.8 % (ref 38.4–49.9)
HGB: 13.6 g/dL (ref 13.0–17.1)
LYMPH%: 31.9 % (ref 14.0–49.0)
MCH: 28.9 pg (ref 27.2–33.4)
MCHC: 33.3 g/dL (ref 32.0–36.0)
MCV: 86.8 fL (ref 79.3–98.0)
MONO#: 0.6 10*3/uL (ref 0.1–0.9)
MONO%: 15.7 % — ABNORMAL HIGH (ref 0.0–14.0)
NEUT#: 1.9 10*3/uL (ref 1.5–6.5)
NEUT%: 48.8 % (ref 39.0–75.0)
PLATELETS: 114 10*3/uL — AB (ref 140–400)
RBC: 4.7 10*6/uL (ref 4.20–5.82)
RDW: 20.2 % — ABNORMAL HIGH (ref 11.0–14.6)
WBC: 3.8 10*3/uL — ABNORMAL LOW (ref 4.0–10.3)
lymph#: 1.2 10*3/uL (ref 0.9–3.3)

## 2016-10-07 MED ORDER — FLUOROURACIL CHEMO INJECTION 2.5 GM/50ML
400.0000 mg/m2 | Freq: Once | INTRAVENOUS | Status: AC
  Administered 2016-10-07: 950 mg via INTRAVENOUS
  Filled 2016-10-07: qty 19

## 2016-10-07 MED ORDER — SODIUM CHLORIDE 0.9 % IV SOLN
2400.0000 mg/m2 | INTRAVENOUS | Status: DC
  Administered 2016-10-07: 5800 mg via INTRAVENOUS
  Filled 2016-10-07: qty 116

## 2016-10-07 MED ORDER — DEXAMETHASONE SODIUM PHOSPHATE 10 MG/ML IJ SOLN
INTRAMUSCULAR | Status: AC
  Filled 2016-10-07: qty 1

## 2016-10-07 MED ORDER — DEXAMETHASONE SODIUM PHOSPHATE 10 MG/ML IJ SOLN
10.0000 mg | Freq: Once | INTRAMUSCULAR | Status: AC
  Administered 2016-10-07: 10 mg via INTRAVENOUS

## 2016-10-07 MED ORDER — PALONOSETRON HCL INJECTION 0.25 MG/5ML
INTRAVENOUS | Status: AC
  Filled 2016-10-07: qty 5

## 2016-10-07 MED ORDER — LEUCOVORIN CALCIUM INJECTION 350 MG
400.0000 mg/m2 | Freq: Once | INTRAMUSCULAR | Status: AC
  Administered 2016-10-07: 964 mg via INTRAVENOUS
  Filled 2016-10-07: qty 48.2

## 2016-10-07 MED ORDER — SODIUM CHLORIDE 0.9 % IV SOLN
Freq: Once | INTRAVENOUS | Status: AC
  Administered 2016-10-07: 11:00:00 via INTRAVENOUS

## 2016-10-07 MED ORDER — DEXTROSE 5 % IV SOLN
Freq: Once | INTRAVENOUS | Status: AC
  Administered 2016-10-07: 12:00:00 via INTRAVENOUS

## 2016-10-07 MED ORDER — SODIUM CHLORIDE 0.9 % IV SOLN
5.3000 mg/kg | Freq: Once | INTRAVENOUS | Status: AC
  Administered 2016-10-07: 600 mg via INTRAVENOUS
  Filled 2016-10-07: qty 8

## 2016-10-07 MED ORDER — PALONOSETRON HCL INJECTION 0.25 MG/5ML
0.2500 mg | Freq: Once | INTRAVENOUS | Status: AC
  Administered 2016-10-07: 0.25 mg via INTRAVENOUS

## 2016-10-07 MED ORDER — OXALIPLATIN CHEMO INJECTION 100 MG/20ML
84.0000 mg/m2 | Freq: Once | INTRAVENOUS | Status: AC
  Administered 2016-10-07: 200 mg via INTRAVENOUS
  Filled 2016-10-07: qty 40

## 2016-10-07 NOTE — Progress Notes (Signed)
Blood return noted before and after Adrucil push.  

## 2016-10-07 NOTE — Progress Notes (Signed)
Okay to tx with avastin despite port placement on 09/30/16 per Dr. Irene Limbo. Additionally, pt denies further bleeding from PICC removal site.

## 2016-10-07 NOTE — Patient Instructions (Signed)
[facility] Discharge Instructions for Patients Receiving Chemotherapy  Today you received the following chemotherapy agents: Oxaliplatin, Leucovorin, Adrucil and Avastin   To help prevent nausea and vomiting after your treatment, we encourage you to take your nausea medication as directed.    If you develop nausea and vomiting that is not controlled by your nausea medication, call the clinic.   BELOW ARE SYMPTOMS THAT SHOULD BE REPORTED IMMEDIATELY:  *FEVER GREATER THAN 100.5 F  *CHILLS WITH OR WITHOUT FEVER  NAUSEA AND VOMITING THAT IS NOT CONTROLLED WITH YOUR NAUSEA MEDICATION  *UNUSUAL SHORTNESS OF BREATH  *UNUSUAL BRUISING OR BLEEDING  TENDERNESS IN MOUTH AND THROAT WITH OR WITHOUT PRESENCE OF ULCERS  *URINARY PROBLEMS  *BOWEL PROBLEMS  UNUSUAL RASH Items with * indicate a potential emergency and should be followed up as soon as possible.  Feel free to call the clinic you have any questions or concerns. The clinic phone number is [phone].  Please show the CHEMO ALERT CARD at check-in to the Emergency Department and triage nurse.   

## 2016-10-09 ENCOUNTER — Ambulatory Visit (HOSPITAL_BASED_OUTPATIENT_CLINIC_OR_DEPARTMENT_OTHER): Payer: 59

## 2016-10-09 VITALS — BP 134/77 | HR 93 | Resp 20

## 2016-10-09 DIAGNOSIS — Z452 Encounter for adjustment and management of vascular access device: Secondary | ICD-10-CM | POA: Diagnosis not present

## 2016-10-09 DIAGNOSIS — C182 Malignant neoplasm of ascending colon: Secondary | ICD-10-CM

## 2016-10-09 DIAGNOSIS — C189 Malignant neoplasm of colon, unspecified: Secondary | ICD-10-CM

## 2016-10-09 DIAGNOSIS — C787 Secondary malignant neoplasm of liver and intrahepatic bile duct: Principal | ICD-10-CM

## 2016-10-09 MED ORDER — HEPARIN SOD (PORK) LOCK FLUSH 100 UNIT/ML IV SOLN
500.0000 [IU] | Freq: Once | INTRAVENOUS | Status: AC | PRN
  Administered 2016-10-09: 500 [IU]
  Filled 2016-10-09: qty 5

## 2016-10-09 MED ORDER — SODIUM CHLORIDE 0.9% FLUSH
10.0000 mL | INTRAVENOUS | Status: DC | PRN
  Administered 2016-10-09: 10 mL
  Filled 2016-10-09: qty 10

## 2016-10-12 DIAGNOSIS — C787 Secondary malignant neoplasm of liver and intrahepatic bile duct: Secondary | ICD-10-CM | POA: Diagnosis not present

## 2016-10-12 DIAGNOSIS — C786 Secondary malignant neoplasm of retroperitoneum and peritoneum: Secondary | ICD-10-CM | POA: Diagnosis not present

## 2016-10-12 DIAGNOSIS — C189 Malignant neoplasm of colon, unspecified: Secondary | ICD-10-CM | POA: Diagnosis not present

## 2016-10-13 ENCOUNTER — Telehealth: Payer: Self-pay | Admitting: Hematology

## 2016-10-13 NOTE — Telephone Encounter (Signed)
Faxed records to [employer] [phone] °

## 2016-10-14 DIAGNOSIS — C189 Malignant neoplasm of colon, unspecified: Secondary | ICD-10-CM | POA: Diagnosis not present

## 2016-10-20 ENCOUNTER — Other Ambulatory Visit: Payer: Self-pay | Admitting: *Deleted

## 2016-10-20 DIAGNOSIS — C189 Malignant neoplasm of colon, unspecified: Secondary | ICD-10-CM

## 2016-10-20 DIAGNOSIS — C787 Secondary malignant neoplasm of liver and intrahepatic bile duct: Principal | ICD-10-CM

## 2016-10-20 DIAGNOSIS — G4733 Obstructive sleep apnea (adult) (pediatric): Secondary | ICD-10-CM | POA: Diagnosis not present

## 2016-10-21 ENCOUNTER — Telehealth: Payer: Self-pay | Admitting: Hematology

## 2016-10-21 ENCOUNTER — Other Ambulatory Visit (HOSPITAL_BASED_OUTPATIENT_CLINIC_OR_DEPARTMENT_OTHER): Payer: 59

## 2016-10-21 ENCOUNTER — Encounter: Payer: Self-pay | Admitting: Hematology

## 2016-10-21 ENCOUNTER — Other Ambulatory Visit: Payer: Self-pay

## 2016-10-21 ENCOUNTER — Ambulatory Visit (HOSPITAL_BASED_OUTPATIENT_CLINIC_OR_DEPARTMENT_OTHER): Payer: 59

## 2016-10-21 ENCOUNTER — Ambulatory Visit (HOSPITAL_BASED_OUTPATIENT_CLINIC_OR_DEPARTMENT_OTHER): Payer: 59 | Admitting: Hematology

## 2016-10-21 VITALS — BP 123/84 | HR 84 | Temp 98.5°F | Resp 18 | Ht 74.0 in | Wt 250.2 lb

## 2016-10-21 VITALS — BP 122/66 | HR 84 | Resp 84

## 2016-10-21 DIAGNOSIS — C189 Malignant neoplasm of colon, unspecified: Secondary | ICD-10-CM

## 2016-10-21 DIAGNOSIS — Z5111 Encounter for antineoplastic chemotherapy: Secondary | ICD-10-CM

## 2016-10-21 DIAGNOSIS — Z5112 Encounter for antineoplastic immunotherapy: Secondary | ICD-10-CM | POA: Diagnosis not present

## 2016-10-21 DIAGNOSIS — C182 Malignant neoplasm of ascending colon: Secondary | ICD-10-CM | POA: Diagnosis not present

## 2016-10-21 DIAGNOSIS — C787 Secondary malignant neoplasm of liver and intrahepatic bile duct: Secondary | ICD-10-CM

## 2016-10-21 DIAGNOSIS — Z79899 Other long term (current) drug therapy: Secondary | ICD-10-CM

## 2016-10-21 DIAGNOSIS — Z95828 Presence of other vascular implants and grafts: Secondary | ICD-10-CM

## 2016-10-21 DIAGNOSIS — D509 Iron deficiency anemia, unspecified: Secondary | ICD-10-CM | POA: Diagnosis not present

## 2016-10-21 LAB — COMPREHENSIVE METABOLIC PANEL
ALT: 65 U/L — ABNORMAL HIGH (ref 0–55)
ANION GAP: 10 meq/L (ref 3–11)
AST: 53 U/L — AB (ref 5–34)
Albumin: 3.7 g/dL (ref 3.5–5.0)
Alkaline Phosphatase: 109 U/L (ref 40–150)
BUN: 7.5 mg/dL (ref 7.0–26.0)
CALCIUM: 9.5 mg/dL (ref 8.4–10.4)
CHLORIDE: 107 meq/L (ref 98–109)
CO2: 23 mEq/L (ref 22–29)
Creatinine: 0.9 mg/dL (ref 0.7–1.3)
Glucose: 155 mg/dl — ABNORMAL HIGH (ref 70–140)
POTASSIUM: 4.4 meq/L (ref 3.5–5.1)
Sodium: 140 mEq/L (ref 136–145)
Total Bilirubin: 0.76 mg/dL (ref 0.20–1.20)
Total Protein: 7.1 g/dL (ref 6.4–8.3)

## 2016-10-21 LAB — CBC & DIFF AND RETIC
BASO%: 1 % (ref 0.0–2.0)
BASOS ABS: 0 10*3/uL (ref 0.0–0.1)
EOS%: 2.7 % (ref 0.0–7.0)
Eosinophils Absolute: 0.1 10*3/uL (ref 0.0–0.5)
HEMATOCRIT: 41.9 % (ref 38.4–49.9)
HGB: 13.8 g/dL (ref 13.0–17.1)
Immature Retic Fract: 3.4 % (ref 3.00–10.60)
LYMPH%: 30.1 % (ref 14.0–49.0)
MCH: 29.2 pg (ref 27.2–33.4)
MCHC: 32.9 g/dL (ref 32.0–36.0)
MCV: 88.7 fL (ref 79.3–98.0)
MONO#: 0.9 10*3/uL (ref 0.1–0.9)
MONO%: 23 % — AB (ref 0.0–14.0)
NEUT#: 1.8 10*3/uL (ref 1.5–6.5)
NEUT%: 43.2 % (ref 39.0–75.0)
PLATELETS: 107 10*3/uL — AB (ref 140–400)
RBC: 4.72 10*6/uL (ref 4.20–5.82)
RDW: 22.4 % — AB (ref 11.0–14.6)
RETIC %: 1.47 % (ref 0.80–1.80)
Retic Ct Abs: 69.38 10*3/uL (ref 34.80–93.90)
WBC: 4.1 10*3/uL (ref 4.0–10.3)
lymph#: 1.2 10*3/uL (ref 0.9–3.3)

## 2016-10-21 LAB — UA PROTEIN, DIPSTICK - CHCC: Protein, ur: NEGATIVE mg/dL

## 2016-10-21 MED ORDER — DEXAMETHASONE SODIUM PHOSPHATE 10 MG/ML IJ SOLN
INTRAMUSCULAR | Status: AC
  Filled 2016-10-21: qty 1

## 2016-10-21 MED ORDER — DEXTROSE 5 % IV SOLN
Freq: Once | INTRAVENOUS | Status: AC
  Administered 2016-10-21: 13:00:00 via INTRAVENOUS

## 2016-10-21 MED ORDER — SODIUM CHLORIDE 0.9 % IV SOLN
2400.0000 mg/m2 | INTRAVENOUS | Status: DC
  Administered 2016-10-21: 5800 mg via INTRAVENOUS
  Filled 2016-10-21: qty 116

## 2016-10-21 MED ORDER — FLUOROURACIL CHEMO INJECTION 2.5 GM/50ML
400.0000 mg/m2 | Freq: Once | INTRAVENOUS | Status: AC
  Administered 2016-10-21: 950 mg via INTRAVENOUS
  Filled 2016-10-21: qty 19

## 2016-10-21 MED ORDER — PALONOSETRON HCL INJECTION 0.25 MG/5ML
0.2500 mg | Freq: Once | INTRAVENOUS | Status: AC
  Administered 2016-10-21: 0.25 mg via INTRAVENOUS

## 2016-10-21 MED ORDER — SODIUM CHLORIDE 0.9 % IV SOLN
Freq: Once | INTRAVENOUS | Status: AC
  Administered 2016-10-21: 12:00:00 via INTRAVENOUS

## 2016-10-21 MED ORDER — LEUCOVORIN CALCIUM INJECTION 350 MG
400.0000 mg/m2 | Freq: Once | INTRAMUSCULAR | Status: AC
  Administered 2016-10-21: 964 mg via INTRAVENOUS
  Filled 2016-10-21: qty 48.2

## 2016-10-21 MED ORDER — OXALIPLATIN CHEMO INJECTION 100 MG/20ML
84.0000 mg/m2 | Freq: Once | INTRAVENOUS | Status: AC
  Administered 2016-10-21: 200 mg via INTRAVENOUS
  Filled 2016-10-21: qty 40

## 2016-10-21 MED ORDER — DEXAMETHASONE SODIUM PHOSPHATE 10 MG/ML IJ SOLN
10.0000 mg | Freq: Once | INTRAMUSCULAR | Status: AC
  Administered 2016-10-21: 10 mg via INTRAVENOUS

## 2016-10-21 MED ORDER — PALONOSETRON HCL INJECTION 0.25 MG/5ML
INTRAVENOUS | Status: AC
  Filled 2016-10-21: qty 5

## 2016-10-21 MED ORDER — BEVACIZUMAB CHEMO INJECTION 400 MG/16ML
5.1000 mg/kg | Freq: Once | INTRAVENOUS | Status: AC
  Administered 2016-10-21: 600 mg via INTRAVENOUS
  Filled 2016-10-21: qty 16

## 2016-10-21 NOTE — Patient Instructions (Signed)
[facility] Discharge Instructions for Patients Receiving Chemotherapy  Today you received the following chemotherapy agents: Avastin, Oxaliplatin, Leucovorin, Adrucil  To help prevent nausea and vomiting after your treatment, we encourage you to take your nausea medication as directed.    If you develop nausea and vomiting that is not controlled by your nausea medication, call the clinic.   BELOW ARE SYMPTOMS THAT SHOULD BE REPORTED IMMEDIATELY:  *FEVER GREATER THAN 100.5 F  *CHILLS WITH OR WITHOUT FEVER  NAUSEA AND VOMITING THAT IS NOT CONTROLLED WITH YOUR NAUSEA MEDICATION  *UNUSUAL SHORTNESS OF BREATH  *UNUSUAL BRUISING OR BLEEDING  TENDERNESS IN MOUTH AND THROAT WITH OR WITHOUT PRESENCE OF ULCERS  *URINARY PROBLEMS  *BOWEL PROBLEMS  UNUSUAL RASH Items with * indicate a potential emergency and should be followed up as soon as possible.  Feel free to call the clinic you have any questions or concerns. The clinic phone number is [phone].  Please show the CHEMO ALERT CARD at check-in to the Emergency Department and triage nurse.   

## 2016-10-21 NOTE — Progress Notes (Signed)
Blood return noted before and after Adrucil push.  Pt stable at discharge.

## 2016-10-21 NOTE — Telephone Encounter (Signed)
Next treatment scheduled already. To wait for Dr. Irene Limbo to schedule RTC  per 10/21/2016 los - on [place] on RTC date. Will contact Patient when schedule is made - pt is aware.

## 2016-10-21 NOTE — Progress Notes (Signed)
Scott Gallagher    HEMATOLOGY/ONCOLOGY CLINIC NOTE  Date of Service: .10/21/2016  Patient Care Team: Scott Contras, MD as PCP - General (Family Medicine) Scott Gallagher M.D. 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 KRAS mutated BRAF, NRAS mutation neg MSI Stable.  PET/CT scan done on 06/25/2016 Show multiple foci of hypermetabolic hepatic metastases (atleast 4-5) and 2 hypermetabolic peritoneal nodules along the dorsal peritoneal surface concerning for peritoneal metastases.  MRI Liver 07/27/2016 - Multiple small liver metastases throughout the right and left hepatic lobes, largest measuring 2.1 cm. 1.2 cm enhancing peritoneal nodule in left paracolic gutter, suspicious for peritoneal metastasis. Other small peritoneal nodules better visualized on recent PET-CT which showed hypermetabolic activity, also suspicious for peritoneal metastases.    TREATMENT  FOLFOX x 7 cycles. Avastin added from Cycle 5  INTERVAL HISTORY  Patient is here for follow-up prior to his 8th cycle of FOLFOX . He has had MRI abd and CT chest at [facility] end of Feb 2018 with shows no metastatic disease in the chest and improvement in 2 peritoneal lesions and multiple liver lesions. I communicated with Dr Crisoforo Oxford and he notes that the imaging will be reviewed in their tumor board on 10/27/2016 for further recommendations from their standpoint. Patient notes no acute new concerns. We discussed the imaging findings. We will proceed with his C8 of FOLFOX + Avastin. No significant neuropathic symptoms.   MEDICAL HISTORY:  Past Medical History:  Diagnosis Date  . Anemia   . Arthritis   .  Cancer ([place])   . Diabetes mellitus without complication (HCC)    diet controlled  . History of blood transfusion   . Hyperlipidemia   . Sleep apnea    cpap  . Wears glasses     SURGICAL HISTORY: Past Surgical History:  Procedure Laterality Date  . COLON SURGERY    . IR GENERIC HISTORICAL  09/24/2016   IR CV LINE INJECTION 09/24/2016 [facility]  . IR GENERIC HISTORICAL  09/24/2016   IR US GUIDE VASC ACCESS RIGHT 09/24/2016 [facility]  . IR GENERIC HISTORICAL  09/24/2016   IR FLUORO GUIDE CV LINE RIGHT 09/24/2016 [facility]  . IR GENERIC HISTORICAL  09/30/2016   IR FLUORO GUIDE PORT INSERTION RIGHT 09/30/2016 Scott Daft, MD [facility]  . IR GENERIC HISTORICAL  09/30/2016   IR US GUIDE VASC ACCESS RIGHT 09/30/2016 Scott Daft, MD [facility]  . IR GENERIC HISTORICAL  09/30/2016   IR REMOVAL TUN ACCESS W/ PORT W/O FL MOD SED 09/30/2016 Scott Daft, MD [facility]  . LAPAROSCOPIC RIGHT HEMI COLECTOMY Right 06/17/2016   Procedure: LAPAROSCOPIC ASSISTED  RIGHT HEMI COLECTOMY;  Surgeon: Scott Hausen, MD;  Location: [facility];  Service: General;  Laterality: Right;  . PORTACATH PLACEMENT Left 07/13/2016   Procedure: INSERTION PORT-A-CATH left subclavian;  Surgeon: Scott Hausen, MD;  Location: [facility];  Service: General;  Laterality: Left;  . SHOULDER ACROMIOPLASTY Right 12/20/2014   Procedure: SHOULDER ACROMIOPLASTY;  Surgeon: Scott Nakayama, MD;  Location: [place];  Service: Orthopedics;  Laterality: Right;  . SHOULDER ARTHROSCOPY Right 12/20/2014   Procedure: RIGHT ARTHROSCOPY SHOULDER WITH DEBRIDEMENT;  Surgeon: Scott Nakayama, MD;  Location: [place];  Service: Orthopedics;  Laterality: Right;  . TESTICLE SURGERY     as teen  . TONSILLECTOMY    . WISDOM TOOTH EXTRACTION      SOCIAL HISTORY: Social History   Social History  . Marital status: Married    Spouse name: N/A  . Number of children: N/A  . Years of education: N/A   Occupational History  .  cemetery maintenance    Social History Main Topics  . Smoking status: Former Smoker    Packs/day: 1.50    Years: 29.00    Types: Cigarettes    Quit date: 12/16/2012  . Smokeless tobacco: Never Used     Comment: 1-2 ppd from age [age]y to 2014  . Alcohol use 1.8 oz/week    3 Shots of liquor per week     Comment:  some days  . Drug use: No  . Sexual activity: Not on file   Other Topics Concern  . Not on file   Social History Narrative  . No narrative on file    FAMILY HISTORY: Family History  Problem Relation Age of Onset  . Diabetes Other   . Hyperlipidemia Other   . Hypertension Other   . Breast cancer Maternal Aunt 70  . Prostate cancer Maternal Uncle 75  . Lung cancer Paternal Uncle 48  . Stroke Maternal Grandfather 72  . Cancer Paternal Grandmother 41    dx cancer of pancreas and colon, unknown if separate primaries  . Heart Problems Paternal Grandfather     d. 63  . Prostate cancer Maternal Uncle 21  . Breast cancer Maternal Aunt 75  . Breast cancer Maternal Aunt 68  . Cervical cancer Maternal Aunt 68  . Pancreatic cancer Maternal Aunt 54    d. 58y; heavy smoker  . Colon cancer Paternal Uncle 69    s/p partial colectomy; dx. second colon cancer at age 72-64    ALLERGIES:  is allergic to penicillins.  MEDICATIONS:  Current Outpatient Prescriptions  Medication Sig Dispense Refill  . dexamethasone (DECADRON) 4 MG tablet Take 2 tablets (8 mg total) by mouth daily. Start the day after chemotherapy for 2 days. Take with food. 30 tablet 1  . lidocaine-prilocaine (EMLA) cream Apply to affected area once 30 g 3  . LORazepam (ATIVAN) 0.5 MG tablet Take 1 tablet (0.5 mg total) by mouth every 8 (eight) hours. For anxiety or sleep 30 tablet 0  . Multiple Vitamin (MULTIVITAMIN) tablet Take 1 tablet by mouth daily.    . ondansetron (ZOFRAN) 8 MG tablet Take 1 tablet (8 mg total) by mouth 2 (two) times daily as needed for refractory nausea / vomiting. Start on day 3 after  chemotherapy. 30 tablet 1  . Probiotic Product (PROBIOTIC PO) Take 1 tablet by mouth daily.    . prochlorperazine (COMPAZINE) 10 MG tablet Take 1 tablet (10 mg total) by mouth every 6 (six) hours as needed (Nausea or vomiting). 30 tablet 1  . traMADol (ULTRAM) 50 MG tablet Take 1 tablet (50 mg total) by mouth every 6 (six) hours as needed for moderate pain or severe pain. 30 tablet 0   No current facility-administered medications for this visit.     REVIEW OF SYSTEMS:    10 Point review of Systems was done is negative except as noted above.  PHYSICAL EXAMINATION: ECOG PERFORMANCE STATUS: 1 - Symptomatic but completely ambulatory  . Vitals:   10/21/16 1025  BP: 123/84  Pulse: 84  Resp: 18  Temp: 98.5 F (  36.9 C)   Filed Weights   10/21/16 1025  Weight: 250 lb 3.2 oz (113.5 kg)   .Body mass index is 32.12 kg/m. GENERAL:alert, in no acute distress and comfortable SKIN: skin color, texture, turgor are normal, no rashes or significant lesions EYES: normal, conjunctiva are pink and non-injected, sclera clear OROPHARYNX:no exudate, no erythema and lips, buccal mucosa, and tongue normal  NECK: supple, no JVD, thyroid normal size, non-tender, without nodularity LYMPH:  no palpable lymphadenopathy in the cervical, axillary or inguinal LUNGS: clear to auscultation with normal respiratory effort HEART: regular rate & rhythm,  no murmurs and no lower extremity edema ABDOMEN: abdomen soft, bowel sounds normoactive, healing laparoscopic surgical incisions with no discharge look clean. Musculoskeletal: no cyanosis of digits and no clubbing  PSYCH: alert & oriented x 3 with fluent speech NEURO: no focal motor/sensory deficits   LABORATORY DATA:  I have reviewed the data as listed  . CBC Latest Ref Rng & Units 10/21/2016 10/07/2016 09/30/2016  WBC 4.0 - 10.3 10e3/uL 4.1 3.8(L) 3.1(L)  Hemoglobin 13.0 - 17.1 g/dL 13.8 13.6 13.7  Hematocrit 38.4 - 49.9 % 41.9 40.8 41.3  Platelets 140 -  400 10e3/uL 107(L) 114(L) 128(L)    . CMP Latest Ref Rng & Units 10/21/2016 10/07/2016 09/23/2016  Glucose 70 - 140 mg/dl 155(H) 151(H) 109  BUN 7.0 - 26.0 mg/dL 7.5 8.5 11.3  Creatinine 0.7 - 1.3 mg/dL 0.9 0.9 1.0  Sodium 136 - 145 mEq/L 140 139 140  Potassium 3.5 - 5.1 mEq/L 4.4 4.2 4.5  Chloride 101 - 111 mmol/L - - -  CO2 22 - 29 mEq/L 23 22 24   Calcium 8.4 - 10.4 mg/dL 9.5 9.5 9.7  Total Protein 6.4 - 8.3 g/dL 7.1 6.9 7.0  Total Bilirubin 0.20 - 1.20 mg/dL 0.76 0.67 0.60  Alkaline Phos 40 - 150 U/L 109 93 90  AST 5 - 34 U/L 53(H) 45(H) 37(H)  ALT 0 - 55 U/L 65(H) 64(H) 46       Microscopic Comment 2. COLON AND RECTUM (INCLUDING TRANS-ANAL RESECTION): Specimen: Terminal ileum, right colon and appendix Procedure: Segmental resection Tumor site: Proximal ascending colon Specimen integrity: Intact Macroscopic intactness of mesorectum: Not applicable: x Complete: NA Near complete: NA Incomplete: NA Cannot be determined (specify): NA Macroscopic tumor perforation: The mass invades through muscularis propria into pericolonic soft tissue Invasive tumor: Maximum size: 5.5 cm Histologic type(s): Adenocarcinoma Histologic grade and differentiation: G3 G1: well differentiated/low grade 1 of 4 Supplemental copy SUPPLEMENTAL for Marandola, Wagner ([id number]) Microscopic Comment(continued) G2: moderately differentiated/low grade G3: poorly differentiated/high grade G4: undifferentiated/high grade Type of polyp in which invasive carcinoma arose: Tubular adenoma Microscopic extension of invasive tumor: The mass invade through the muscularis propria into pericolonic soft tissue Lymph-Vascular invasion: Identified Peri-neural invasion: Identified Tumor deposit(s) (discontinuous extramural extension): Present Resection margins: Proximal margin: Negative Distal margin: Negative Circumferential (radial) (posterior ascending, posterior descending; lateral and posterior mid-rectum; and  entire lower 1/3 rectum):Negative Mesenteric margin (sigmoid and transverse): NA Distance closest margin (if all above margins negative): 3.7 cm from the non peritonealized pericolic soft tissue margin Trans-anal resection margins only: Deep margin: NA Mucosal Margin: NA Distance closest mucosal margin (if negative): NA Treatment effect (neo-adjuvant therapy): NA Additional polyp(s): Negative Non-neoplastic findings: Unremarkable Lymph nodes: number examined 18; number positive: 5 Pathologic Staging: pT3, N2a, M1a Ancillary studies: MSI ordered      RADIOGRAPHIC STUDIES: I have personally reviewed the radiological images as listed and agreed with the findings in the report.  Ir Fluoro Guide Cv Line Right  Result Date: 09/24/2016 CLINICAL DATA:  Leak from left subclavian port catheter, with scheduled revision. Central venous access needed for chemotherapy regimen. EXAM: PICC PLACEMENT WITH ULTRASOUND AND FLUOROSCOPY FLUOROSCOPY TIME:  0.3 minute (86 uGym2 DAP) TECHNIQUE: After written informed consent was obtained, patient was placed in the supine position on angiographic table. Patency of the right basilic vein was confirmed with ultrasound with image documentation. An appropriate skin site was determined. Skin site was marked. Region was prepped using maximum barrier technique including cap and mask, sterile gown, sterile gloves, large sterile sheet, and Chlorhexidine as cutaneous antisepsis. The region was infiltrated locally with 1% lidocaine. Under real-time ultrasound guidance, the right basilic vein was accessed with a 21 gauge micropuncture needle; the needle tip within the vein was confirmed with ultrasound image documentation. Needle exchanged over a 018 guidewire for a peel-away sheath, through which a 5-French single-lumen power injectable PICC trimmed to 46cm was advanced, positioned with its tip near the cavoatrial junction. Spot chest radiograph confirms appropriate catheter  position. Catheter was flushed per protocol and secured externally. The patient tolerated procedure well. COMPLICATIONS: COMPLICATIONS none IMPRESSION: 1. Technically successful five [place] single lumen power injectable PICC placement Electronically Signed   By: Lucrezia Europe M.D.   On: 09/24/2016 14:04   Ir Removal [employer] W/ Heath W/o Fl Mod Sed  Result Date: 09/30/2016 INDICATION: Metastatic colon cancer. Fractured left subclavian Port-A-Cath. Patient scheduled for port removal and new port placement. EXAM: TUNNELED PORT CATHETER REMOVAL MEDICATIONS: Clindamycin 600 mg IV was given prior to the port removal and poor placement ANESTHESIA/SEDATION: See sedation medications and time on the port placement report. FLUOROSCOPY TIME:  None COMPLICATIONS: None immediate. PROCEDURE: The procedure was explained to the patient. The risks and benefits of the procedure were discussed and the patient's questions were addressed. Informed consent was obtained from the patient. Left side of the chest was prepped and draped in sterile fashion. Maximal barrier sterile technique was utilized including caps, mask, sterile gowns, sterile gloves, sterile drape, hand hygiene and skin antiseptic. Skin was anesthetized over the old port. An incision was made over the old incision. The port was easily exposed. The port catheter was removed without complication. The port retention sutures were removed and the remainder of the port was removed. Pocket was irrigated with sterile saline. The pocket was closed using 2 layers of absorbable suture and Dermabond. Dressing was placed over the port. IMPRESSION: Successful removal the left chest Port-A-Cath. Electronically Signed   By: Scott Gallagher M.D.   On: 09/30/2016 14:38   Ir Cv Line Injection  Result Date: 09/24/2016 CLINICAL DATA:  Metastatic colon carcinoma to liver. Port catheter placed surgically 07/13/2016. Patient describes resistance/pain with flush) EXAM: PORT  CATHETER INJECTION  UNDER FLUOROSCOPY TECHNIQUE: The procedure, risks (including but not limited to bleeding, infection, organ damage ), benefits, and alternatives were explained to the patient. Questions regarding the procedure were encouraged and answered. The patient understands and consents to the procedure. Survey fluoroscopic inspection reveals stable position of the left subclavian port catheter. Injection demonstrates partial extravasation at the level of the costoclavicular ligament. There is a partial fibrin sheath around the catheter. Catheter tip remains patent in the distal SVC. IMPRESSION: 1. Fracture of the port catheter at the level of the costoclavicular ligament, with extravasation. The findings were telephoned to the cancer center. PICC line will be placed, and patient scheduled for port revision. Electronically Signed   By: Lucrezia Europe  M.D.   On: 09/24/2016 14:03   Ir US Guide Vasc Access Right  Result Date: 09/30/2016 INDICATION: Metastatic colon cancer. Patient had a fractured left subclavian Port-A-Cath which was removed immediately prior to this procedure. EXAM: FLUOROSCOPIC AND ULTRASOUND GUIDED PLACEMENT OF A SUBCUTANEOUS PORT COMPARISON:  None. MEDICATIONS: Clindamycin 600 mg IV. The antibiotic was administered within an appropriate time interval prior to skin puncture. ANESTHESIA/SEDATION: Versed 6.0 mg IV; Fentanyl 100 mcg IV; Moderate Sedation Time:  46 minutes The patient was continuously monitored during the procedure by the interventional radiology nurse under my direct supervision. The sedation time and sedation medications include both the port removal and port placement. FLUOROSCOPY TIME:  18 seconds, 7 mGy COMPLICATIONS: None immediate. PROCEDURE: The procedure, risks, benefits, and alternatives were explained to the patient. Questions regarding the procedure were encouraged and answered. The patient understands and consents to the procedure. Patient was placed supine on the interventional table.  Ultrasound confirmed a patent right internal jugular vein. The right chest and neck were cleaned with a skin antiseptic and a sterile drape was placed. Maximal barrier sterile technique was utilized including caps, mask, sterile gowns, sterile gloves, sterile drape, hand hygiene and skin antiseptic. The right neck was anesthetized with 1% lidocaine. Small incision was made in the right neck with a blade. Micropuncture set was placed in the right internal jugular vein with ultrasound guidance. The micropuncture wire was used for measurement purposes. The right chest was anesthetized with 1% lidocaine with epinephrine. #15 blade was used to make an incision and a subcutaneous port pocket was formed. [place] was assembled. Subcutaneous tunnel was formed with a stiff tunneling device. The port catheter was brought through the subcutaneous tunnel. The port was placed in the subcutaneous pocket. The micropuncture set was exchanged for a peel-away sheath. The catheter was placed through the peel-away sheath and the tip was positioned at the superior cavoatrial junction. Catheter placement was confirmed with fluoroscopy. The port was accessed and flushed with heparinized saline. The port pocket was closed using two layers of absorbable sutures and Dermabond. The vein skin site was closed using a single layer of absorbable suture and Dermabond. Sterile dressings were applied. Patient tolerated the procedure well without an immediate complication. Ultrasound and fluoroscopic images were taken and saved for this procedure. IMPRESSION: Placement of a subcutaneous port device. Electronically Signed   By: Scott Gallagher M.D.   On: 09/30/2016 14:32   Ir US Guide Vasc Access Right  Result Date: 09/24/2016 CLINICAL DATA:  Leak from left subclavian port catheter, with scheduled revision. Central venous access needed for chemotherapy regimen. EXAM: PICC PLACEMENT WITH ULTRASOUND AND FLUOROSCOPY FLUOROSCOPY TIME:  0.3 minute  (86 uGym2 DAP) TECHNIQUE: After written informed consent was obtained, patient was placed in the supine position on angiographic table. Patency of the right basilic vein was confirmed with ultrasound with image documentation. An appropriate skin site was determined. Skin site was marked. Region was prepped using maximum barrier technique including cap and mask, sterile gown, sterile gloves, large sterile sheet, and Chlorhexidine as cutaneous antisepsis. The region was infiltrated locally with 1% lidocaine. Under real-time ultrasound guidance, the right basilic vein was accessed with a 21 gauge micropuncture needle; the needle tip within the vein was confirmed with ultrasound image documentation. Needle exchanged over a 018 guidewire for a peel-away sheath, through which a 5-French single-lumen power injectable PICC trimmed to 46cm was advanced, positioned with its tip near the cavoatrial junction. Spot chest radiograph confirms appropriate  catheter position. Catheter was flushed per protocol and secured externally. The patient tolerated procedure well. COMPLICATIONS: COMPLICATIONS none IMPRESSION: 1. Technically successful five [place] single lumen power injectable PICC placement Electronically Signed   By: Lucrezia Europe M.D.   On: 09/24/2016 14:04   Ir Fluoro Guide Port Insertion Right  Result Date: 09/30/2016 INDICATION: Metastatic colon cancer. Patient had a fractured left subclavian Port-A-Cath which was removed immediately prior to this procedure. EXAM: FLUOROSCOPIC AND ULTRASOUND GUIDED PLACEMENT OF A SUBCUTANEOUS PORT COMPARISON:  None. MEDICATIONS: Clindamycin 600 mg IV. The antibiotic was administered within an appropriate time interval prior to skin puncture. ANESTHESIA/SEDATION: Versed 6.0 mg IV; Fentanyl 100 mcg IV; Moderate Sedation Time:  46 minutes The patient was continuously monitored during the procedure by the interventional radiology nurse under my direct supervision. The sedation time and  sedation medications include both the port removal and port placement. FLUOROSCOPY TIME:  18 seconds, 7 mGy COMPLICATIONS: None immediate. PROCEDURE: The procedure, risks, benefits, and alternatives were explained to the patient. Questions regarding the procedure were encouraged and answered. The patient understands and consents to the procedure. Patient was placed supine on the interventional table. Ultrasound confirmed a patent right internal jugular vein. The right chest and neck were cleaned with a skin antiseptic and a sterile drape was placed. Maximal barrier sterile technique was utilized including caps, mask, sterile gowns, sterile gloves, sterile drape, hand hygiene and skin antiseptic. The right neck was anesthetized with 1% lidocaine. Small incision was made in the right neck with a blade. Micropuncture set was placed in the right internal jugular vein with ultrasound guidance. The micropuncture wire was used for measurement purposes. The right chest was anesthetized with 1% lidocaine with epinephrine. #15 blade was used to make an incision and a subcutaneous port pocket was formed. [place] was assembled. Subcutaneous tunnel was formed with a stiff tunneling device. The port catheter was brought through the subcutaneous tunnel. The port was placed in the subcutaneous pocket. The micropuncture set was exchanged for a peel-away sheath. The catheter was placed through the peel-away sheath and the tip was positioned at the superior cavoatrial junction. Catheter placement was confirmed with fluoroscopy. The port was accessed and flushed with heparinized saline. The port pocket was closed using two layers of absorbable sutures and Dermabond. The vein skin site was closed using a single layer of absorbable suture and Dermabond. Sterile dressings were applied. Patient tolerated the procedure well without an immediate complication. Ultrasound and fluoroscopic images were taken and saved for this  procedure. IMPRESSION: Placement of a subcutaneous port device. Electronically Signed   By: Scott Gallagher M.D.   On: 09/30/2016 14:32   2018 February MR ABDOMEN AND PELVIS WWOTUMOR FOLLOW UP2/26/2018 [facility] Result Impression    Decreased size of multiple hepatic lesions and two peritoneal nodules compared to outside MRI 07/27/2016. No new lesions identified in the abdomen or pelvis.  Result Narrative  MR ABDOMEN AND PELVIS WITH AND WITHOUT CONTRAST (TUMOR FOLLOW-UP PROTOCOL), 10/12/2016 5:59 PM   INDICATION: liver and peritoneal colon cancer metastases \ C18.9 Metastatic colon cancer to liver (HCC) \ C78.7 Metastatic colon cancer to liver ([place]) \ C78.6 Peritoneal carcinomatosis ([place]) \ C80.1 Peritoneal carcinomatosis ([place])   ADDITIONAL HISTORY: History of stage IV colorectal cancer status post laparoscopic right hemicolectomy on 06/17/2016. At time of surgery, tumor nodule in the right lobe of the liver was positive on biopsy for adenocarcinoma. Patient currently undergoing chemotherapy.  COMPARISON: Outside studies including  CT chest abdomen pelvis 05/23/2016, PET CT 07/15/2016, and MR abdomen 07/27/2016   TECHNIQUE: Multiplanar, multisequence MR images of the abdomen and pelvis were obtained before and after intravenous administration of gadolinium-based contrast.   FINDINGS:  LOWER CHEST Heart: Normal size. No pericardial effusion. Lungs/pleura: No masses, consolidations, or effusions.  ABDOMEN Liver: Liver is enlarged, measuring 20 cm in greatest craniocaudal dimension. Hepatic steatosis. Multiple T2 hyperintense and T1 iso to hypointense lesions throughout the liver as marked in series 16, decreased in size compared to outside MR 07/27/2016. Largest lesion is in segment 8 and measures 1.4 cm and demonstrates peripheral enhancement. No new lesion. Gallbladder: Normal. No stones or inflammatory changes. Biliary: No obstruction. Spleen: Spleen is mildly  enlarged, measuring 13.8 cm in greatest length. Pancreas: Normal. Adrenals: Normal. Kidneys: Multiple T2 hyperintense nonenhancing lesions in the lower pole of the kidneys bilaterally, consistent with cysts. No hydronephrosis. Stomach/bowel: Right hemicolectomy. Peritoneum: Decreased size of peritoneal nodule in the left paracolic gutter measuring 6 mm (series 10, image 45). Decreased size of peritoneal nodule in the left lower quadrant along the ventral abdominal wall measuring 8 mm (series 23, image 13). No apparent new measurable lesions. Extraperitoneum: Within normal limits. Vascular: Within normal limits.  PELVIS Ureters: Within normal limits. Bladder: Within normal limits. Reproductive system: Within normal limits.  MSK: Lower midline ventral abdominal wall scar.   CT CHEST WO CONTRAST2/26/2018 [facility] Result Impression   1. Congenital variant of bilateral middle lobes. 2. Groundglass opacification with regions of bronchiectasis in the right lower lobe adjacent to the fissure, consistent with inflammatory/infectious changes. 3. No definitive evidence of metastatic disease to the chest. 4. Probable sebaceous cyst in the subcutaneous tissues beneath the left anterior chest.  Result Narrative  CT CHEST WO CONTRAST, 10/12/2016 3:24 PM  INDICATION:stage IV colon cancer \ C18.9 Metastatic colon cancer to liver ([place]) \ C78.7 Metastatic colon cancer to liver ([employer]) \ C78.6 Peritoneal carcinomatosis ([place]) \ C80.1 Peritoneal carcinomatosis ([place])  COMPARISON: None  TECHNIQUE: Multislice axial images were obtained through the chest without administration of iodinated intravenous contrast material. Multi-planar reformatted images were generated for additional analysis. Nongated technique limits cardiac detail.  All CT scans at [facility] and [place] are performed using dose optimization techniques as appropriate to a  performed exam, including but not limited to one or more of the following: automated exposure control, adjustment of the mA and/or kV according to patient size, use of iterative reconstruction technique. In addition, [facility] is participating in the [employer] program which will further assist [place] in optimizing patient radiation exposure.   FINDINGS:   Thoracic inlet/central airways: Parenchyma is within normal limits for age. The trachea is patent. Mediastinum/hila/axilla: Scattered, nonpathologically enlarged nodes. Heart/vessels: Normal heart size. Trace pericardial effusion. Coronary artery calcifications.. The tip of the central line is in the distal SVC. Lungs/pleura: There are bilateral middle lobes. Subtle groundglass opacification surrounding the posterior aspect of the right major fissure. This is seen in association with some bronchiectasis. Upper abdomen: See concurrent MRI study. Chest wall/MSK: Multi-segmented sternum. 1.5 cm low-density soft tissue nodule in the subcutaneous tissues beneath the left anterior chest.      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 KRAS mutated BRAF, NRAS mutation neg MSI Stable.  PET/CT scan  done on 06/25/2016 Show multiple foci of hypermetabolic hepatic metastases (atleast 4-5) and 2 hypermetabolic peritoneal nodules along the dorsal peritoneal surface concerning for peritoneal metastases.  MRI Liver 07/27/2016 - Multiple small liver metastases throughout the right and left hepatic lobes, largest measuring 2.1 cm. 1.2 cm enhancing peritoneal nodule in left paracolic gutter, suspicious for peritoneal metastasis. Other small peritoneal nodules better visualized on recent PET-CT which showed hypermetabolic activity, also suspicious for  peritoneal metastases.   MRI Abd 10/12/2016 at [facility] --shows improvement in all the liver and the 2 peritoneal lesions and no new lesions PLAN -Patient tolerated the first 7 cycles of FOLFOX along with Avastin (from Cycle 5) without any prohibitive toxicities. -Labs are stable and we will continue FOLFOX plus Avastin at this time next cycle today. -communicated with Dr Crisoforo Oxford -- further recommendation pending at this time - tumor board apparently later this month. -will get images from [facility] to upload into our system for further future imaging comparison  #2 Rt upper jaw dental pain --periodonitis resolved, orthopantogram with no abscess. -peridex mouthwash QID   #3. 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 Lab Results  Component Value Date   IRON 302 (H) 09/23/2016   TIBC 481 (H) 09/23/2016   IRONPCTSAT 63 (H) 09/23/2016   (Iron and TIBC)  Lab Results  Component Value Date   FERRITIN 121 09/23/2016   Plan -now off PO iron  #4 small furuncle/carbuncle on his back -resolving Plan -Recommended warm compresses and gently washing with soap and water. -Topical clindamycin 2-3 times daily recommended- did not get it due to cost concerns.  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 Port catheter in place 09/25/2016  . Family history of colon cancer 07/24/2016  . Family history of prostate cancer 07/24/2016  . Metastatic colon cancer to liver ([place]) 07/01/2016  . Right Colon cancer metastasized to liver ([place]) 06/17/2016  . Chronic GI bleeding 05/29/2016  . Diabetes mellitus type 2, diet-controlled ([place]) 05/22/2016  . OSA (obstructive sleep apnea) 05/22/2016  . Hyperlipidemia 05/22/2016  . Anemia due to blood loss, chronic   . Anemia 05/21/2016   -Continue follow-up with primary care physician .Gara Kroner, MD for optimization of  diabetes management . Might need basal insulin if his blood sugars are elevated with chemotherapy/ steroids . -Continue use of CPAP for sleep apnea . -off statins  -continue FOLFOX + Avastin  As per plan -RTC with Dr Irene Limbo in 2 weeks with C9D1 tx to adjust further plan if neccesary based on Dr Lyda Jester input.   All of the patients questions were answered with apparent satisfaction. The patient knows to call the clinic with any problems, questions or concerns.  I spent 20 minutes counseling the patient face to face. The total time spent in the appointment was 25 minutes and more than 50% was on counseling and direct patient cares.    Sullivan Lone MD [place] AAHIVMS [facility] Hematology/Oncology Physician [facility]  (Office):       [phone] (Work cell):  [phone] (Fax):           [phone]

## 2016-10-22 ENCOUNTER — Telehealth: Payer: Self-pay | Admitting: Hematology

## 2016-10-22 ENCOUNTER — Telehealth: Payer: Self-pay | Admitting: *Deleted

## 2016-10-22 NOTE — Telephone Encounter (Signed)
-----   Message from Brunetta Genera, MD sent at 10/21/2016 10:42 PM EST ----- Regarding: Outside images [facility], We would need to request Raekwon's recent MRI abd and CT chest result images on CD (done 10/12/16) from [facility] and have them loaded into our PACS system for further comparative imaging reviews . Thanks, GK

## 2016-10-22 NOTE — Telephone Encounter (Signed)
Called patient to schedule r/s  RTC appt with Dr Irene Limbo per 10/21/2016 los. Patient is aware of new date and time

## 2016-10-22 NOTE — Telephone Encounter (Signed)
Per staff message, sent request to [phone]. Phone #: [phone].

## 2016-10-23 ENCOUNTER — Ambulatory Visit (HOSPITAL_BASED_OUTPATIENT_CLINIC_OR_DEPARTMENT_OTHER): Payer: 59

## 2016-10-23 VITALS — BP 121/75 | HR 86 | Temp 98.2°F | Resp 16

## 2016-10-23 DIAGNOSIS — C787 Secondary malignant neoplasm of liver and intrahepatic bile duct: Principal | ICD-10-CM

## 2016-10-23 DIAGNOSIS — C182 Malignant neoplasm of ascending colon: Secondary | ICD-10-CM | POA: Diagnosis not present

## 2016-10-23 DIAGNOSIS — C189 Malignant neoplasm of colon, unspecified: Secondary | ICD-10-CM

## 2016-10-23 MED ORDER — SODIUM CHLORIDE 0.9% FLUSH
10.0000 mL | INTRAVENOUS | Status: DC | PRN
  Administered 2016-10-23: 10 mL
  Filled 2016-10-23: qty 10

## 2016-10-23 MED ORDER — HEPARIN SOD (PORK) LOCK FLUSH 100 UNIT/ML IV SOLN
500.0000 [IU] | Freq: Once | INTRAVENOUS | Status: AC | PRN
  Administered 2016-10-23: 500 [IU]
  Filled 2016-10-23: qty 5

## 2016-10-27 ENCOUNTER — Telehealth: Payer: Self-pay | Admitting: *Deleted

## 2016-10-27 NOTE — Telephone Encounter (Signed)
Pt wife called stating " We have not heard about scheduling an appointment with Dr. Lyda Jester office at [facility]" "We have chemo scheduled 3/21, but no follow up with Dr. Irene Limbo, and no chemo scheduled the first week of April"   This RN reviewed information with Dr. Irene Limbo.  Per MD, he has contacted Dr. Crisoforo Oxford who stated tumor board will be held today to discuss pt's case.  Dr. Lyda Jester office should be contacting the patient and/or Dr. Irene Limbo to review recommendations.  Dr. Grier Mitts plan will be based off of recommendations of surgeon.  We will keep scheduled chemo for 3/21 unless suggested otherwise by Dr. Crisoforo Oxford.   Instructed pt's wife to contact our office should they hear from Dr. Crisoforo Oxford.  We will contact them if we hear from Dr. Crisoforo Oxford as well.  Pt's wife verbalized understanding.

## 2016-10-28 ENCOUNTER — Telehealth: Payer: Self-pay | Admitting: Hematology

## 2016-10-28 NOTE — Telephone Encounter (Signed)
Scheduled appts per sch msg. Per Myrtie Neither no need to contact patient.

## 2016-10-30 ENCOUNTER — Telehealth: Payer: Self-pay | Admitting: Genetic Counselor

## 2016-10-30 ENCOUNTER — Encounter: Payer: Self-pay | Admitting: Genetic Counselor

## 2016-10-30 DIAGNOSIS — Z1379 Encounter for other screening for genetic and chromosomal anomalies: Secondary | ICD-10-CM | POA: Insufficient documentation

## 2016-10-30 NOTE — Telephone Encounter (Signed)
Revealed that germline genetic testing did not identify a hereditary mutation that would explain his diagnosis of colon cancer or the cancer in the family.  The paired tumor testing that was performed indicated that there were not any Lynch syndrome mutations, but it did find suggestion of chromosome instability that, per [facility] genetics, is the most common cause of sporadic colon cancer.  Therefore, genetic testing was negative.

## 2016-11-03 ENCOUNTER — Other Ambulatory Visit: Payer: Self-pay | Admitting: *Deleted

## 2016-11-03 DIAGNOSIS — C189 Malignant neoplasm of colon, unspecified: Secondary | ICD-10-CM

## 2016-11-03 DIAGNOSIS — C787 Secondary malignant neoplasm of liver and intrahepatic bile duct: Principal | ICD-10-CM

## 2016-11-04 ENCOUNTER — Ambulatory Visit: Payer: Self-pay | Admitting: Genetic Counselor

## 2016-11-04 ENCOUNTER — Ambulatory Visit (HOSPITAL_BASED_OUTPATIENT_CLINIC_OR_DEPARTMENT_OTHER): Payer: 59

## 2016-11-04 ENCOUNTER — Other Ambulatory Visit (HOSPITAL_BASED_OUTPATIENT_CLINIC_OR_DEPARTMENT_OTHER): Payer: 59

## 2016-11-04 VITALS — BP 118/71 | HR 91 | Temp 97.3°F | Resp 16

## 2016-11-04 DIAGNOSIS — Z1379 Encounter for other screening for genetic and chromosomal anomalies: Secondary | ICD-10-CM

## 2016-11-04 DIAGNOSIS — Z5111 Encounter for antineoplastic chemotherapy: Secondary | ICD-10-CM | POA: Diagnosis not present

## 2016-11-04 DIAGNOSIS — C189 Malignant neoplasm of colon, unspecified: Secondary | ICD-10-CM

## 2016-11-04 DIAGNOSIS — C182 Malignant neoplasm of ascending colon: Secondary | ICD-10-CM

## 2016-11-04 DIAGNOSIS — Z79899 Other long term (current) drug therapy: Secondary | ICD-10-CM | POA: Diagnosis not present

## 2016-11-04 DIAGNOSIS — C787 Secondary malignant neoplasm of liver and intrahepatic bile duct: Principal | ICD-10-CM

## 2016-11-04 DIAGNOSIS — Z800 Family history of malignant neoplasm of digestive organs: Secondary | ICD-10-CM

## 2016-11-04 DIAGNOSIS — Z5112 Encounter for antineoplastic immunotherapy: Secondary | ICD-10-CM

## 2016-11-04 DIAGNOSIS — Z8042 Family history of malignant neoplasm of prostate: Secondary | ICD-10-CM

## 2016-11-04 LAB — COMPREHENSIVE METABOLIC PANEL
ALT: 73 U/L — AB (ref 0–55)
AST: 56 U/L — ABNORMAL HIGH (ref 5–34)
Albumin: 3.6 g/dL (ref 3.5–5.0)
Alkaline Phosphatase: 105 U/L (ref 40–150)
Anion Gap: 12 mEq/L — ABNORMAL HIGH (ref 3–11)
BUN: 4.8 mg/dL — ABNORMAL LOW (ref 7.0–26.0)
CHLORIDE: 106 meq/L (ref 98–109)
CO2: 21 meq/L — AB (ref 22–29)
Calcium: 9.3 mg/dL (ref 8.4–10.4)
Creatinine: 0.9 mg/dL (ref 0.7–1.3)
EGFR: >90 mL/min/{1.73_m2} (ref >90)
Glucose: 193 mg/dl — ABNORMAL HIGH (ref 70–140)
Potassium: 4.4 mEq/L (ref 3.5–5.1)
SODIUM: 139 meq/L (ref 136–145)
Total Bilirubin: 0.88 mg/dL (ref 0.20–1.20)
Total Protein: 6.9 g/dL (ref 6.4–8.3)

## 2016-11-04 LAB — CBC & DIFF AND RETIC
BASO%: 0.9 % (ref 0.0–2.0)
Basophils Absolute: 0 10*3/uL (ref 0.0–0.1)
EOS%: 3.1 % (ref 0.0–7.0)
Eosinophils Absolute: 0.1 10*3/uL (ref 0.0–0.5)
HCT: 41.9 % (ref 38.4–49.9)
HGB: 13.9 g/dL (ref 13.0–17.1)
IMMATURE RETIC FRACT: 3.4 % (ref 3.00–10.60)
LYMPH#: 1.1 10*3/uL (ref 0.9–3.3)
LYMPH%: 32 % (ref 14.0–49.0)
MCH: 30.4 pg (ref 27.2–33.4)
MCHC: 33.3 g/dL (ref 32.0–36.0)
MCV: 91.3 fL (ref 79.3–98.0)
MONO#: 0.6 10*3/uL (ref 0.1–0.9)
MONO%: 17.7 % — ABNORMAL HIGH (ref 0.0–14.0)
NEUT#: 1.6 10*3/uL (ref 1.5–6.5)
NEUT%: 46.3 % (ref 39.0–75.0)
Platelets: 86 10*3/uL — ABNORMAL LOW (ref 140–400)
RBC: 4.59 10*6/uL (ref 4.20–5.82)
RDW: 21 % — AB (ref 11.0–14.6)
RETIC %: 1.94 % — AB (ref 0.80–1.80)
RETIC CT ABS: 89.05 10*3/uL (ref 34.80–93.90)
WBC: 3.5 10*3/uL — ABNORMAL LOW (ref 4.0–10.3)

## 2016-11-04 LAB — CEA (IN HOUSE-CHCC): CEA (CHCC-In House): 4.17 ng/mL (ref 0.00–5.00)

## 2016-11-04 LAB — UA PROTEIN, DIPSTICK - CHCC: Protein, ur: NEGATIVE mg/dL

## 2016-11-04 MED ORDER — PALONOSETRON HCL INJECTION 0.25 MG/5ML
INTRAVENOUS | Status: AC
  Filled 2016-11-04: qty 5

## 2016-11-04 MED ORDER — SODIUM CHLORIDE 0.9 % IV SOLN
2400.0000 mg/m2 | INTRAVENOUS | Status: DC
  Administered 2016-11-04: 5800 mg via INTRAVENOUS
  Filled 2016-11-04: qty 116

## 2016-11-04 MED ORDER — SODIUM CHLORIDE 0.9 % IV SOLN
Freq: Once | INTRAVENOUS | Status: AC
  Administered 2016-11-04: 11:00:00 via INTRAVENOUS

## 2016-11-04 MED ORDER — DEXTROSE 5 % IV SOLN
Freq: Once | INTRAVENOUS | Status: AC
  Administered 2016-11-04: 12:00:00 via INTRAVENOUS

## 2016-11-04 MED ORDER — FLUOROURACIL CHEMO INJECTION 2.5 GM/50ML
400.0000 mg/m2 | Freq: Once | INTRAVENOUS | Status: AC
  Administered 2016-11-04: 950 mg via INTRAVENOUS
  Filled 2016-11-04: qty 19

## 2016-11-04 MED ORDER — SODIUM CHLORIDE 0.9 % IV SOLN
5.3000 mg/kg | Freq: Once | INTRAVENOUS | Status: AC
  Administered 2016-11-04: 600 mg via INTRAVENOUS
  Filled 2016-11-04: qty 16

## 2016-11-04 MED ORDER — DEXAMETHASONE SODIUM PHOSPHATE 10 MG/ML IJ SOLN
INTRAMUSCULAR | Status: AC
  Filled 2016-11-04: qty 1

## 2016-11-04 MED ORDER — LEUCOVORIN CALCIUM INJECTION 350 MG
400.0000 mg/m2 | Freq: Once | INTRAVENOUS | Status: AC
  Administered 2016-11-04: 964 mg via INTRAVENOUS
  Filled 2016-11-04: qty 48.2

## 2016-11-04 MED ORDER — PALONOSETRON HCL INJECTION 0.25 MG/5ML
0.2500 mg | Freq: Once | INTRAVENOUS | Status: AC
  Administered 2016-11-04: 0.25 mg via INTRAVENOUS

## 2016-11-04 MED ORDER — OXALIPLATIN CHEMO INJECTION 100 MG/20ML
84.0000 mg/m2 | Freq: Once | INTRAVENOUS | Status: AC
  Administered 2016-11-04: 200 mg via INTRAVENOUS
  Filled 2016-11-04: qty 40

## 2016-11-04 MED ORDER — DEXAMETHASONE SODIUM PHOSPHATE 10 MG/ML IJ SOLN
10.0000 mg | Freq: Once | INTRAMUSCULAR | Status: AC
  Administered 2016-11-04: 10 mg via INTRAVENOUS

## 2016-11-04 NOTE — Patient Instructions (Signed)
[facility] Discharge Instructions for Patients Receiving Chemotherapy  Today you received the following chemotherapy agents avastin/leucovorin/oxaliplatin/florouracil.    To help prevent nausea and vomiting after your treatment, we encourage you to take your nausea medication as directed.  If you develop nausea and vomiting that is not controlled by your nausea medication, call the clinic.   BELOW ARE SYMPTOMS THAT SHOULD BE REPORTED IMMEDIATELY:  *FEVER GREATER THAN 100.5 F  *CHILLS WITH OR WITHOUT FEVER  NAUSEA AND VOMITING THAT IS NOT CONTROLLED WITH YOUR NAUSEA MEDICATION  *UNUSUAL SHORTNESS OF BREATH  *UNUSUAL BRUISING OR BLEEDING  TENDERNESS IN MOUTH AND THROAT WITH OR WITHOUT PRESENCE OF ULCERS  *URINARY PROBLEMS  *BOWEL PROBLEMS  UNUSUAL RASH Items with * indicate a potential emergency and should be followed up as soon as possible.  Feel free to call the clinic you have any questions or concerns. The clinic phone number is [phone].

## 2016-11-04 NOTE — Progress Notes (Signed)
Ok to treat with plt count of 86 per Dr. Irene Limbo.

## 2016-11-04 NOTE — Progress Notes (Signed)
HPI: Mr. Giroux was previously seen in the [facility] clinic due to a personal and family history of cancer and concerns regarding a hereditary predisposition to cancer. Please refer to our prior cancer genetics clinic note for more information regarding Mr. [facility] medical, social and family histories, and our assessment and recommendations, at the time. Mr. Selbe recent genetic test results were disclosed to him, as were recommendations warranted by these results. These results and recommendations are discussed in more detail below.  CANCER HISTORY:    Right Colon cancer metastasized to liver ([place])   06/17/2016 Initial Diagnosis    Right Colon cancer metastasized to liver [facility])     07/17/2016 Miscellaneous    [facility] Results received       10/26/2016 Genetic Testing    Negative genetic testing on the CancerNext panel.  The CancerNext gene panel offered by [employer] includes sequencing and rearrangement analysis for the following 32 genes:   APC, ATM, BARD1, BMPR1A, BRCA1, BRCA2, BRIP1, CDH1, CDK4, CDKN2A, CHEK2, EPCAM, GREM1, MLH1, MRE11A, MSH2, MSH6, MUTYH, NBN, NF1, PALB2, PMS2, POLD1, POLE, PTEN, RAD50, RAD51D, SMAD4, SMARCA4, STK11, and TP53.  The report date is October 26, 2016.  TumorNext testing found copy number gains and whole gene deletions of somatic origin.  Specficially whole gene deletions of EPCAM, MSH2 and MSH6, and a PMS2 gain. It also found a KRAS p.G13R mutation.  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       FAMILY HISTORY:  We obtained a detailed, 4-generation family history.  Significant diagnoses are listed below: Family History  Problem Relation Age of Onset  . Diabetes Other   . Hyperlipidemia Other   . Hypertension Other   . Breast cancer Maternal Aunt 70  . Prostate cancer Maternal Uncle 75   . Lung cancer Paternal Uncle 98  . Stroke Maternal Grandfather 72  . Cancer Paternal Grandmother 30    dx cancer of pancreas and colon, unknown if separate primaries  . Heart Problems Paternal Grandfather     d. 77  . Prostate cancer Maternal Uncle 43  . Breast cancer Maternal Aunt 75  . Breast cancer Maternal Aunt 68  . Cervical cancer Maternal Aunt 68  . Pancreatic cancer Maternal Aunt 54    d. 58y; heavy smoker  . Colon cancer Paternal Uncle 63    s/p partial colectomy; dx. second colon cancer at age 29-64    Mr. Bousquet has on son and one daughter, ages 86 and 86.  He has one full sister who is currently 77 and who has never had cancer.  She has one son and three daughters of her own.  Mr. Garber parents are both currently 41 and have not had cancer.  Mr. Brogden mother has five full brothers and eight full sisters.  Two brothers have passed away--at age 75 and 17.  Both of these brothers were diagnosed with prostate cancers in their mid-50's to mid-70's.  Two sisters have passed away.  One sister was diagnosed with breast cancer at 22 and passed away at 81.  Another sister was a heavy smoker and was diagnosed with pancreatic cancer at 24 and passed away at 11.  Six sisters and three brothers are still living, all in their late 70's-early 80's.  Two other sisters have also had breast cancer, diagnosed at age 25 and at age 79--this latter  sister was also diagnosed with cervical cancer at 77.  Three of Mr. Ess's maternal aunts are paternal half-sisters to his father, but he is not sure which ones are half and which are full sisters.  He is unaware of any cancer history for his maternal first cousins, but he has limited information for many of these relatives.  His maternal grandmother is currently 57 and has never had cancer.  His maternal grandfather died from a stroke at age 17.  He has no information for any maternal great aunts/uncles and great grandparents.  Mr. Bangs  father has nine full brothers and three full sisters.  One brother died of lung cancer at 48.  One brother passed away at an older age due to complications from an accident he had when he was younger.  The other brothers and sisters are still living.  One brother was diagnosed with colon cancer at 35 and with a second colon cancer between the age of 30-64.  Mr. Grajales is not aware of a cancer history for his other paternal aunts and uncles.  He has limited information for some of these relatives and their children, however.  His paternal grandmother was diagnosed with a colon cancer and/or pancreatic cancer at age 9 and she passed away at 24.  His grandfather passed away from heart-related causes at 44.  Mr. Kolbeck has no information for his paternal great aunts/uncles and great grandparents.  Mr. Deiss is unaware of any family history of genetic testing for hereditary cancer risks.  Patient's maternal ancestors are of Native [place] descent, and paternal ancestors are of English descent. There is no reported Ashkenazi Jewish ancestry. There is no known consanguinity.  GENETIC TEST RESULTS: Genetic testing reported out on October 26, 2016 through the [place] with CancerNext cancer panel found no deleterious mutations.  The CancerNext gene panel offered by [employer] includes sequencing and rearrangement analysis for the following 32 genes:   APC, ATM, BARD1, BMPR1A, BRCA1, BRCA2, BRIP1, CDH1, CDK4, CDKN2A, CHEK2, EPCAM, GREM1, MLH1, MRE11A, MSH2, MSH6, MUTYH, NBN, NF1, PALB2, PMS2, POLD1, POLE, PTEN, RAD50, RAD51D, SMAD4, SMARCA4, STK11, and TP53.  The test report has been scanned into EPIC and is located under the Molecular Pathology section of the Results Review tab.   TumorNext testing found copy number gains and whole gene deletions of somatic origin.  Specifically whole gene deletions of EPCAM, MSH2 and MSH6, and a PMS2 gain. It also found a KRAS p.G13R mutation.  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  We discussed with Mr. Serio that since the current genetic testing is not perfect, it is possible there may be a gene mutation in one of these genes that current testing cannot detect, but that chance is small. We also discussed, that it is possible that another gene that has not yet been discovered, or that we have not yet tested, is responsible for the cancer diagnoses in the family, and it is, therefore, important to remain in touch with cancer genetics in the future so that we can continue to offer Mr. Zelman the most up to date genetic testing.   CANCER SCREENING RECOMMENDATIONS:  This result is reassuring and indicates that Mr. Maute likely does not have an increased risk for a future cancer due to a mutation in one of these genes. This normal test also suggests that Mr. Tennison's cancer  was most likely not due to an inherited predisposition associated with one of these genes.  Most cancers happen by chance and this negative test suggests that his cancer falls into this category.  We, therefore, recommended he continue to follow the cancer management and screening guidelines provided by his oncology and primary healthcare provider.   RECOMMENDATIONS FOR FAMILY MEMBERS: Women in this family might be at some increased risk of developing cancer, over the general population risk, simply due to the family history of cancer. We recommended women in this family have a yearly mammogram beginning at age 24, or 15 years younger than the earliest onset of cancer, an annual clinical breast exam, and perform monthly breast self-exams. Women in this family should also have a gynecological exam as recommended by their primary provider. All family members should have a colonoscopy by age 76.  FOLLOW-UP: Lastly, we discussed with Mr. Rabine  that cancer genetics is a rapidly advancing field and it is possible that new genetic tests will be appropriate for him and/or his family members in the future. We encouraged him to remain in contact with cancer genetics on an annual basis so we can update his personal and family histories and let him know of advances in cancer genetics that may benefit this family.   Our contact number was provided. Mr. Peifer questions were answered to his satisfaction, and he knows he is welcome to call us at anytime with additional questions or concerns.   Roma Kayser, MS, [facility] Certified Genetic Counselor [email or web address]

## 2016-11-05 ENCOUNTER — Telehealth: Payer: Self-pay | Admitting: *Deleted

## 2016-11-05 NOTE — Telephone Encounter (Signed)
-----   Message from Brunetta Genera, MD sent at 11/05/2016  2:07 AM EDT ----- Duwayne Heck, Could you plz let Shaheem know that his genetic testing results was negative for the tested mutation. No findings of a hereditary colon cancer syndrome noted.  Thanks, [place]  ----- Message ----- From: Clarene Essex, Counselor Sent: 11/04/2016   7:29 AM To: Brunetta Genera, MD  Negative genetic testing

## 2016-11-05 NOTE — Telephone Encounter (Signed)
Per staff message, called patient and left voicemail to give a call back concerning results.

## 2016-11-05 NOTE — Telephone Encounter (Signed)
Patient called back and was informed of results.  

## 2016-11-06 ENCOUNTER — Ambulatory Visit (HOSPITAL_BASED_OUTPATIENT_CLINIC_OR_DEPARTMENT_OTHER): Payer: 59

## 2016-11-06 VITALS — BP 132/75 | HR 77 | Temp 97.7°F | Resp 16

## 2016-11-06 DIAGNOSIS — C189 Malignant neoplasm of colon, unspecified: Secondary | ICD-10-CM

## 2016-11-06 DIAGNOSIS — C182 Malignant neoplasm of ascending colon: Secondary | ICD-10-CM

## 2016-11-06 DIAGNOSIS — C787 Secondary malignant neoplasm of liver and intrahepatic bile duct: Principal | ICD-10-CM

## 2016-11-06 MED ORDER — HEPARIN SOD (PORK) LOCK FLUSH 100 UNIT/ML IV SOLN
500.0000 [IU] | Freq: Once | INTRAVENOUS | Status: AC | PRN
  Administered 2016-11-06: 500 [IU]
  Filled 2016-11-06: qty 5

## 2016-11-06 MED ORDER — SODIUM CHLORIDE 0.9% FLUSH
10.0000 mL | INTRAVENOUS | Status: DC | PRN
  Administered 2016-11-06: 10 mL
  Filled 2016-11-06: qty 10

## 2016-11-06 NOTE — Patient Instructions (Signed)

## 2016-11-12 DIAGNOSIS — C189 Malignant neoplasm of colon, unspecified: Secondary | ICD-10-CM | POA: Diagnosis not present

## 2016-11-13 ENCOUNTER — Other Ambulatory Visit: Payer: Self-pay | Admitting: Hematology

## 2016-11-18 ENCOUNTER — Other Ambulatory Visit (HOSPITAL_BASED_OUTPATIENT_CLINIC_OR_DEPARTMENT_OTHER): Payer: 59

## 2016-11-18 ENCOUNTER — Ambulatory Visit (HOSPITAL_BASED_OUTPATIENT_CLINIC_OR_DEPARTMENT_OTHER): Payer: 59

## 2016-11-18 ENCOUNTER — Ambulatory Visit (HOSPITAL_BASED_OUTPATIENT_CLINIC_OR_DEPARTMENT_OTHER): Payer: 59 | Admitting: Nurse Practitioner

## 2016-11-18 ENCOUNTER — Encounter: Payer: Self-pay | Admitting: Nurse Practitioner

## 2016-11-18 ENCOUNTER — Encounter (HOSPITAL_COMMUNITY): Payer: Self-pay

## 2016-11-18 ENCOUNTER — Ambulatory Visit: Payer: 59

## 2016-11-18 VITALS — BP 117/71 | HR 74 | Temp 98.4°F | Resp 18 | Ht 74.0 in | Wt 250.2 lb

## 2016-11-18 DIAGNOSIS — D61810 Antineoplastic chemotherapy induced pancytopenia: Secondary | ICD-10-CM | POA: Diagnosis not present

## 2016-11-18 DIAGNOSIS — G620 Drug-induced polyneuropathy: Secondary | ICD-10-CM | POA: Diagnosis not present

## 2016-11-18 DIAGNOSIS — T451X5A Adverse effect of antineoplastic and immunosuppressive drugs, initial encounter: Secondary | ICD-10-CM

## 2016-11-18 DIAGNOSIS — C787 Secondary malignant neoplasm of liver and intrahepatic bile duct: Principal | ICD-10-CM

## 2016-11-18 DIAGNOSIS — C189 Malignant neoplasm of colon, unspecified: Secondary | ICD-10-CM

## 2016-11-18 DIAGNOSIS — Z5111 Encounter for antineoplastic chemotherapy: Secondary | ICD-10-CM

## 2016-11-18 DIAGNOSIS — C182 Malignant neoplasm of ascending colon: Secondary | ICD-10-CM

## 2016-11-18 DIAGNOSIS — Z5112 Encounter for antineoplastic immunotherapy: Secondary | ICD-10-CM | POA: Diagnosis not present

## 2016-11-18 DIAGNOSIS — Z95828 Presence of other vascular implants and grafts: Secondary | ICD-10-CM

## 2016-11-18 DIAGNOSIS — D500 Iron deficiency anemia secondary to blood loss (chronic): Secondary | ICD-10-CM

## 2016-11-18 LAB — CBC WITH DIFFERENTIAL/PLATELET
BASO%: 0.9 % (ref 0.0–2.0)
Basophils Absolute: 0 10*3/uL (ref 0.0–0.1)
EOS%: 2.1 % (ref 0.0–7.0)
Eosinophils Absolute: 0.1 10*3/uL (ref 0.0–0.5)
HCT: 41.1 % (ref 38.4–49.9)
HGB: 13.8 g/dL (ref 13.0–17.1)
LYMPH%: 35.9 % (ref 14.0–49.0)
MCH: 31.2 pg (ref 27.2–33.4)
MCHC: 33.5 g/dL (ref 32.0–36.0)
MCV: 93.4 fL (ref 79.3–98.0)
MONO#: 0.5 10*3/uL (ref 0.1–0.9)
MONO%: 18.8 % — ABNORMAL HIGH (ref 0.0–14.0)
NEUT#: 1.1 10*3/uL — ABNORMAL LOW (ref 1.5–6.5)
NEUT%: 42.3 % (ref 39.0–75.0)
PLATELETS: 81 10*3/uL — AB (ref 140–400)
RBC: 4.4 10*6/uL (ref 4.20–5.82)
RDW: 18.6 % — ABNORMAL HIGH (ref 11.0–14.6)
WBC: 2.7 10*3/uL — ABNORMAL LOW (ref 4.0–10.3)
lymph#: 1 10*3/uL (ref 0.9–3.3)

## 2016-11-18 LAB — COMPREHENSIVE METABOLIC PANEL
ALT: 58 U/L — AB (ref 0–55)
AST: 54 U/L — ABNORMAL HIGH (ref 5–34)
Albumin: 3.6 g/dL (ref 3.5–5.0)
Alkaline Phosphatase: 94 U/L (ref 40–150)
Anion Gap: 10 mEq/L (ref 3–11)
BUN: 10 mg/dL (ref 7.0–26.0)
CHLORIDE: 108 meq/L (ref 98–109)
CO2: 22 meq/L (ref 22–29)
Calcium: 9.3 mg/dL (ref 8.4–10.4)
Creatinine: 0.9 mg/dL (ref 0.7–1.3)
Glucose: 231 mg/dl — ABNORMAL HIGH (ref 70–140)
POTASSIUM: 4.3 meq/L (ref 3.5–5.1)
Sodium: 139 mEq/L (ref 136–145)
Total Bilirubin: 0.93 mg/dL (ref 0.20–1.20)
Total Protein: 6.8 g/dL (ref 6.4–8.3)

## 2016-11-18 MED ORDER — SODIUM CHLORIDE 0.9 % IV SOLN
Freq: Once | INTRAVENOUS | Status: AC
  Administered 2016-11-18: 10:00:00 via INTRAVENOUS

## 2016-11-18 MED ORDER — FLUOROURACIL CHEMO INJECTION 2.5 GM/50ML
400.0000 mg/m2 | Freq: Once | INTRAVENOUS | Status: AC
  Administered 2016-11-18: 950 mg via INTRAVENOUS
  Filled 2016-11-18: qty 19

## 2016-11-18 MED ORDER — DEXAMETHASONE SODIUM PHOSPHATE 10 MG/ML IJ SOLN
10.0000 mg | Freq: Once | INTRAMUSCULAR | Status: AC
Start: 2016-11-18 — End: 2016-11-18
  Administered 2016-11-18: 10 mg via INTRAVENOUS

## 2016-11-18 MED ORDER — SODIUM CHLORIDE 0.9 % IV SOLN
2400.0000 mg/m2 | INTRAVENOUS | Status: DC
  Administered 2016-11-18: 5800 mg via INTRAVENOUS
  Filled 2016-11-18: qty 116

## 2016-11-18 MED ORDER — DEXAMETHASONE SODIUM PHOSPHATE 10 MG/ML IJ SOLN
INTRAMUSCULAR | Status: AC
  Filled 2016-11-18: qty 1

## 2016-11-18 MED ORDER — SODIUM CHLORIDE 0.9% FLUSH
10.0000 mL | INTRAVENOUS | Status: DC | PRN
  Administered 2016-11-18: 10 mL via INTRAVENOUS
  Filled 2016-11-18: qty 10

## 2016-11-18 MED ORDER — SODIUM CHLORIDE 0.9 % IV SOLN
5.3000 mg/kg | Freq: Once | INTRAVENOUS | Status: AC
  Administered 2016-11-18: 600 mg via INTRAVENOUS
  Filled 2016-11-18: qty 16

## 2016-11-18 MED ORDER — LEUCOVORIN CALCIUM INJECTION 350 MG
400.0000 mg/m2 | Freq: Once | INTRAVENOUS | Status: AC
  Administered 2016-11-18: 964 mg via INTRAVENOUS
  Filled 2016-11-18: qty 48.2

## 2016-11-18 MED ORDER — PALONOSETRON HCL INJECTION 0.25 MG/5ML
INTRAVENOUS | Status: AC
  Filled 2016-11-18: qty 5

## 2016-11-18 MED ORDER — PALONOSETRON HCL INJECTION 0.25 MG/5ML
0.2500 mg | Freq: Once | INTRAVENOUS | Status: AC
  Administered 2016-11-18: 0.25 mg via INTRAVENOUS

## 2016-11-18 MED ORDER — DEXTROSE 5 % IV SOLN
84.0000 mg/m2 | Freq: Once | INTRAVENOUS | Status: AC
  Administered 2016-11-18: 200 mg via INTRAVENOUS
  Filled 2016-11-18: qty 40

## 2016-11-18 MED ORDER — DEXTROSE 5 % IV SOLN
Freq: Once | INTRAVENOUS | Status: AC
  Administered 2016-11-18: 12:00:00 via INTRAVENOUS

## 2016-11-18 NOTE — Assessment & Plan Note (Signed)
Patient presented to the [facility] today to receive cycle 10 of his FOLFOX/Avastin chemotherapy regimen.  See further notes for details of today's visit.  Patient will proceed with his chemotherapy today as planned.  He is scheduled to return on 12/02/2016 for labs and a follow-up visit.  However, there is no chemotherapy scheduled for that same day.  Advised both patient and his wife that I would review all with Dr. Irene Limbo; and then scheduling will confirm if there is chemotherapy plan for that day or not.

## 2016-11-18 NOTE — Patient Instructions (Signed)
Implanted Port Home Guide An implanted port is a type of central line that is placed under the skin. Central lines are used to provide IV access when treatment or nutrition needs to be given through a person's veins. Implanted ports are used for long-term IV access. An implanted port may be placed because:  You need IV medicine that would be irritating to the small veins in your hands or arms.  You need long-term IV medicines, such as antibiotics.  You need IV nutrition for a long period.  You need frequent blood draws for lab tests.  You need dialysis.  Implanted ports are usually placed in the chest area, but they can also be placed in the upper arm, the abdomen, or the leg. An implanted port has two main parts:  Reservoir. The reservoir is round and will appear as a small, raised area under your skin. The reservoir is the part where a needle is inserted to give medicines or draw blood.  Catheter. The catheter is a thin, flexible tube that extends from the reservoir. The catheter is placed into a large vein. Medicine that is inserted into the reservoir goes into the catheter and then into the vein.  How will I care for my incision site? Do not get the incision site wet. Bathe or shower as directed by your health care provider. How is my port accessed? Special steps must be taken to access the port:  Before the port is accessed, a numbing cream can be placed on the skin. This helps numb the skin over the port site.  Your health care provider uses a sterile technique to access the port. ? Your health care provider must put on a mask and sterile gloves. ? The skin over your port is cleaned carefully with an antiseptic and allowed to dry. ? The port is gently pinched between sterile gloves, and a needle is inserted into the port.  Only "non-coring" port needles should be used to access the port. Once the port is accessed, a blood return should be checked. This helps ensure that the port  is in the vein and is not clogged.  If your port needs to remain accessed for a constant infusion, a clear (transparent) bandage will be placed over the needle site. The bandage and needle will need to be changed every week, or as directed by your health care provider.  Keep the bandage covering the needle clean and dry. Do not get it wet. Follow your health care provider's instructions on how to take a shower or bath while the port is accessed.  If your port does not need to stay accessed, no bandage is needed over the port.  What is flushing? Flushing helps keep the port from getting clogged. Follow your health care provider's instructions on how and when to flush the port. Ports are usually flushed with saline solution or a medicine called heparin. The need for flushing will depend on how the port is used.  If the port is used for intermittent medicines or blood draws, the port will need to be flushed: ? After medicines have been given. ? After blood has been drawn. ? As part of routine maintenance.  If a constant infusion is running, the port may not need to be flushed.  How long will my port stay implanted? The port can stay in for as long as your health care provider thinks it is needed. When it is time for the port to come out, surgery will be   done to remove it. The procedure is similar to the one performed when the port was put in. When should I seek immediate medical care? When you have an implanted port, you should seek immediate medical care if:  You notice a bad smell coming from the incision site.  You have swelling, redness, or drainage at the incision site.  You have more swelling or pain at the port site or the surrounding area.  You have a fever that is not controlled with medicine.  This information is not intended to replace advice given to you by your health care provider. Make sure you discuss any questions you have with your health care provider. Document  Released: 08/03/2005 Document Revised: 01/09/2016 Document Reviewed: 04/10/2013 Elsevier Interactive Patient Education  2017 [employer].  

## 2016-11-18 NOTE — Assessment & Plan Note (Signed)
Patient presented to the [facility] today to receive cycle 10 of his FOLFOX/Avastin chemotherapy regimen.  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  He is blood counts today revealed WBC 2.7, ANC 1.1, hemoglobin 13.8, and platelet count is 81.  Patient denies any recent fevers or chills.  He also denies any issues with bleeding or bruising.  At this point.  Reviewed all findings and lab results with Dr. Julien Nordmann today; and he advised the patient could proceed today with his chemotherapy as planned.  Briefly reviewed all neutropenia guidelines with the patient and his wife again today.  Also, patient states that he plans to follow-up with his primary care provider this week in regards to further evaluation of his elevated blood sugars.  His blood sugar today was 231.  Patient was advised to call/return of electrical to the emergency department for any worsening symptoms whatsoever.

## 2016-11-18 NOTE — Progress Notes (Signed)
CBC and CMET reviewed by Selena Lesser, NP ok to treat

## 2016-11-18 NOTE — Patient Instructions (Signed)
[facility] Discharge Instructions for Patients Receiving Chemotherapy  Today you received the following chemotherapy agents avastin/leucovorin/oxaliplatin/florouracil.    To help prevent nausea and vomiting after your treatment, we encourage you to take your nausea medication as directed.  If you develop nausea and vomiting that is not controlled by your nausea medication, call the clinic.   BELOW ARE SYMPTOMS THAT SHOULD BE REPORTED IMMEDIATELY:  *FEVER GREATER THAN 100.5 F  *CHILLS WITH OR WITHOUT FEVER  NAUSEA AND VOMITING THAT IS NOT CONTROLLED WITH YOUR NAUSEA MEDICATION  *UNUSUAL SHORTNESS OF BREATH  *UNUSUAL BRUISING OR BLEEDING  TENDERNESS IN MOUTH AND THROAT WITH OR WITHOUT PRESENCE OF ULCERS  *URINARY PROBLEMS  *BOWEL PROBLEMS  UNUSUAL RASH Items with * indicate a potential emergency and should be followed up as soon as possible.  Feel free to call the clinic you have any questions or concerns. The clinic phone number is [phone].

## 2016-11-18 NOTE — Assessment & Plan Note (Signed)
Patient states that he continues with some mild neuropathies in the form of numbness and tingling to his fingertips only.  He states it is fairly stable at this time.  We'll continue to monitor closely.

## 2016-11-18 NOTE — Progress Notes (Signed)
Chief Complaint: Established patient visit  HPI:  Scott Gallagher 46 y.o. male diagnosed with colon cancer with liver metastasis.  Currently undergoing FOLFOX/Avastin chemotherapy regimen.     Right Colon cancer metastasized to liver [facility])   06/17/2016 Initial Diagnosis    Right Colon cancer metastasized to liver [facility])     07/17/2016 Miscellaneous    [facility] Results received       10/26/2016 Genetic Testing    Negative genetic testing on the CancerNext panel.  The CancerNext gene panel offered by [employer] includes sequencing and rearrangement analysis for the following 32 genes:   APC, ATM, BARD1, BMPR1A, BRCA1, BRCA2, BRIP1, CDH1, CDK4, CDKN2A, CHEK2, EPCAM, GREM1, MLH1, MRE11A, MSH2, MSH6, MUTYH, NBN, NF1, PALB2, PMS2, POLD1, POLE, PTEN, RAD50, RAD51D, SMAD4, SMARCA4, STK11, and TP53.  The report date is October 26, 2016.  TumorNext testing found copy number gains and whole gene deletions of somatic origin.  Specficially whole gene deletions of EPCAM, MSH2 and MSH6, and a PMS2 gain. It also found a KRAS p.G13R mutation.  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       Review of Systems  Constitutional: Positive for malaise/fatigue.  Neurological:       Neuropathy to fingertips  All other systems reviewed and are negative.   Past Medical History:  Diagnosis Date  . Anemia   . Arthritis   . Cancer ([place])   . Diabetes mellitus without complication (HCC)    diet controlled  . History of blood transfusion   . Hyperlipidemia   . Sleep apnea    cpap  . Wears glasses     Past Surgical History:  Procedure Laterality Date  . COLON SURGERY    . IR GENERIC HISTORICAL  09/24/2016   IR CV LINE INJECTION 09/24/2016 [facility]  . IR GENERIC HISTORICAL  09/24/2016   IR US GUIDE VASC ACCESS RIGHT 09/24/2016 [facility]  . IR GENERIC  HISTORICAL  09/24/2016   IR FLUORO GUIDE CV LINE RIGHT 09/24/2016 [facility]  . IR GENERIC HISTORICAL  09/30/2016   IR FLUORO GUIDE PORT INSERTION RIGHT 09/30/2016 Markus Daft, MD [facility]  . IR GENERIC HISTORICAL  09/30/2016   IR US GUIDE VASC ACCESS RIGHT 09/30/2016 Markus Daft, MD [facility]  . IR GENERIC HISTORICAL  09/30/2016   IR REMOVAL TUN ACCESS W/ PORT W/O FL MOD SED 09/30/2016 Markus Daft, MD [facility]  . LAPAROSCOPIC RIGHT HEMI COLECTOMY Right 06/17/2016   Procedure: LAPAROSCOPIC ASSISTED  RIGHT HEMI COLECTOMY;  Surgeon: [name], MD;  Location: [facility];  Service: General;  Laterality: Right;  . PORTACATH PLACEMENT Left 07/13/2016   Procedure: INSERTION PORT-A-CATH left subclavian;  Surgeon: [name], MD;  Location: [facility];  Service: General;  Laterality: Left;  . SHOULDER ACROMIOPLASTY Right 12/20/2014   Procedure: SHOULDER ACROMIOPLASTY;  Surgeon: Melrose Nakayama, MD;  Location: [facility];  Service: Orthopedics;  Laterality: Right;  . SHOULDER ARTHROSCOPY Right 12/20/2014   Procedure: RIGHT ARTHROSCOPY SHOULDER WITH DEBRIDEMENT;  Surgeon: Melrose Nakayama, MD;  Location: [facility];  Service: Orthopedics;  Laterality: Right;  . TESTICLE SURGERY     as teen  . TONSILLECTOMY    . WISDOM TOOTH EXTRACTION      has Antineoplastic chemotherapy induced pancytopenia (CODE) ([place]); Diabetes mellitus type 2, diet-controlled ([place]); OSA (obstructive sleep apnea); Hyperlipidemia; Anemia due  to blood loss, chronic; Chronic GI bleeding; Right Colon cancer metastasized to liver [facility]); Metastatic colon cancer to liver [facility]); Family history of colon cancer; Family history of prostate cancer; Port catheter in place; Genetic testing; and Chemotherapy-induced neuropathy ([place]) on his problem list.    is allergic to penicillins.  Allergies as of 11/18/2016      Reactions   Penicillins    Was told as a child he was allergic does not know of type of reaction.         Medication List       Accurate as of 11/18/16  1:32 PM. Always use your most recent med list.          dexamethasone 4 MG tablet Commonly known as:  DECADRON Take 2 tablets (8 mg total) by mouth daily. Start the day after chemotherapy for 2 days. Take with food.   lidocaine-prilocaine cream Commonly known as:  EMLA Apply to affected area once   LORazepam 0.5 MG tablet Commonly known as:  ATIVAN Take 1 tablet (0.5 mg total) by mouth every 8 (eight) hours. For anxiety or sleep   multivitamin tablet Take 1 tablet by mouth daily.   ondansetron 8 MG tablet Commonly known as:  ZOFRAN Take 1 tablet (8 mg total) by mouth 2 (two) times daily as needed for refractory nausea / vomiting. Start on day 3 after chemotherapy.   PROBIOTIC PO Take 1 tablet by mouth daily.   prochlorperazine 10 MG tablet Commonly known as:  COMPAZINE Take 1 tablet (10 mg total) by mouth every 6 (six) hours as needed (Nausea or vomiting).   traMADol 50 MG tablet Commonly known as:  ULTRAM Take 1 tablet (50 mg total) by mouth every 6 (six) hours as needed for moderate pain or severe pain.        PHYSICAL EXAMINATION  Oncology Vitals 11/18/2016 11/06/2016  Height 188 cm -  Weight 113.49 kg -  Weight (lbs) 250 lbs 3 oz -  BMI (kg/m2) 32.12 kg/m2 -  Temp 98.4 97.7  Pulse 74 77  Resp 18 16  SpO2 99 97  BSA (m2) 2.43 m2 -   BP Readings from Last 2 Encounters:  11/18/16 117/71  11/06/16 132/75    Physical Exam  Constitutional: He is oriented to person, place, and time and well-developed, well-nourished, and in no distress.  HENT:  Head: Normocephalic and atraumatic.  Mouth/Throat: Oropharynx is clear and moist.  Eyes: Conjunctivae and EOM are normal. Pupils are equal, round, and reactive to light. Right eye exhibits no discharge. Left eye exhibits no discharge. No scleral icterus.  Neck: Normal range of motion. Neck supple. No JVD present. No tracheal deviation present. No thyromegaly present.   Cardiovascular: Normal rate, regular rhythm, normal heart sounds and intact distal pulses.   Pulmonary/Chest: Effort normal and breath sounds normal. No respiratory distress. He has no wheezes. He has no rales. He exhibits no tenderness.  Abdominal: Soft. Bowel sounds are normal. He exhibits no distension and no mass. There is no tenderness. There is no rebound and no guarding.  Musculoskeletal: Normal range of motion. He exhibits no edema, tenderness or deformity.  Lymphadenopathy:    He has no cervical adenopathy.  Neurological: He is alert and oriented to person, place, and time. Gait normal.  Skin: Skin is warm and dry. No rash noted. No erythema. No pallor.  Psychiatric: Affect normal.  Nursing note and vitals reviewed.   LABORATORY DATA:. Appointment on 11/18/2016  Component Date Value Ref Range  Status  . WBC 11/18/2016 2.7* 4.0 - 10.3 10e3/uL Final  . NEUT# 11/18/2016 1.1* 1.5 - 6.5 10e3/uL Final  . HGB 11/18/2016 13.8  13.0 - 17.1 g/dL Final  . HCT 11/18/2016 41.1  38.4 - 49.9 % Final  . Platelets 11/18/2016 81* 140 - 400 10e3/uL Final  . MCV 11/18/2016 93.4  79.3 - 98.0 fL Final  . MCH 11/18/2016 31.2  27.2 - 33.4 pg Final  . MCHC 11/18/2016 33.5  32.0 - 36.0 g/dL Final  . RBC 11/18/2016 4.40  4.20 - 5.82 10e6/uL Final  . RDW 11/18/2016 18.6* 11.0 - 14.6 % Final  . lymph# 11/18/2016 1.0  0.9 - 3.3 10e3/uL Final  . MONO# 11/18/2016 0.5  0.1 - 0.9 10e3/uL Final  . Eosinophils Absolute 11/18/2016 0.1  0.0 - 0.5 10e3/uL Final  . Basophils Absolute 11/18/2016 0.0  0.0 - 0.1 10e3/uL Final  . NEUT% 11/18/2016 42.3  39.0 - 75.0 % Final  . LYMPH% 11/18/2016 35.9  14.0 - 49.0 % Final  . MONO% 11/18/2016 18.8* 0.0 - 14.0 % Final  . EOS% 11/18/2016 2.1  0.0 - 7.0 % Final  . BASO% 11/18/2016 0.9  0.0 - 2.0 % Final  . Sodium 11/18/2016 139  136 - 145 mEq/L Final  . Potassium 11/18/2016 4.3  3.5 - 5.1 mEq/L Final  . Chloride 11/18/2016 108  98 - 109 mEq/L Final  . CO2 11/18/2016 22   22 - 29 mEq/L Final  . Glucose 11/18/2016 231* 70 - 140 mg/dl Final  . BUN 11/18/2016 10.0  7.0 - 26.0 mg/dL Final  . Creatinine 11/18/2016 0.9  0.7 - 1.3 mg/dL Final  . Total Bilirubin 11/18/2016 0.93  0.20 - 1.20 mg/dL Final  . Alkaline Phosphatase 11/18/2016 94  40 - 150 U/L Final  . AST 11/18/2016 54* 5 - 34 U/L Final  . ALT 11/18/2016 58* 0 - 55 U/L Final  . Total Protein 11/18/2016 6.8  6.4 - 8.3 g/dL Final  . Albumin 11/18/2016 3.6  3.5 - 5.0 g/dL Final  . Calcium 11/18/2016 9.3  8.4 - 10.4 mg/dL Final  . Anion Gap 11/18/2016 10  3 - 11 mEq/L Final  . EGFR 11/18/2016 >90  >90 ml/min/1.73 m2 Final    RADIOGRAPHIC STUDIES: No results found.  ASSESSMENT/PLAN:    Right Colon cancer metastasized to liver [facility]) Patient presented to the [facility] today to receive cycle 10 of his FOLFOX/Avastin chemotherapy regimen.  See further notes for details of today's visit.  Patient will proceed with his chemotherapy today as planned.  He is scheduled to return on 12/02/2016 for labs and a follow-up visit.  However, there is no chemotherapy scheduled for that same day.  Advised both patient and his wife that I would review all with Dr. Irene Limbo; and then scheduling will confirm if there is chemotherapy plan for that day or not.  Chemotherapy-induced neuropathy ([place]) Patient states that he continues with some mild neuropathies in the form of numbness and tingling to his fingertips only.  He states it is fairly stable at this time.  We'll continue to monitor closely.  Antineoplastic chemotherapy induced pancytopenia (CODE) [facility]) Patient presented to the [facility] today to receive cycle 10 of his FOLFOX/Avastin chemotherapy regimen.  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  He is blood counts today revealed WBC 2.7, ANC 1.1, hemoglobin 13.8, and platelet count is 81.  Patient denies any recent fevers  or chills.  He also denies any issues  with bleeding or bruising.  At this point.  Reviewed all findings and lab results with Dr. Julien Nordmann today; and he advised the patient could proceed today with his chemotherapy as planned.  Briefly reviewed all neutropenia guidelines with the patient and his wife again today.  Also, patient states that he plans to follow-up with his primary care provider this week in regards to further evaluation of his elevated blood sugars.  His blood sugar today was 231.  Patient was advised to call/return of electrical to the emergency department for any worsening symptoms whatsoever.   Patient stated understanding of all instructions; and was in agreement with this plan of care. The patient knows to call the clinic with any problems, questions or concerns.   Total time spent with patient was 25 minutes;  with greater than 75 percent of that time spent in face to face counseling regarding patient's symptoms,  and coordination of care and follow up.  Disclaimer:This dictation was prepared with Dragon/digital dictation along with [employer]. Any transcriptional errors that result from this process are unintentional.  Drue Second, NP 11/18/2016

## 2016-11-20 ENCOUNTER — Telehealth: Payer: Self-pay | Admitting: Hematology

## 2016-11-20 ENCOUNTER — Ambulatory Visit (HOSPITAL_BASED_OUTPATIENT_CLINIC_OR_DEPARTMENT_OTHER): Payer: 59

## 2016-11-20 ENCOUNTER — Other Ambulatory Visit: Payer: Self-pay | Admitting: Hematology

## 2016-11-20 VITALS — BP 127/81 | HR 69 | Temp 98.0°F | Resp 16

## 2016-11-20 DIAGNOSIS — C182 Malignant neoplasm of ascending colon: Secondary | ICD-10-CM

## 2016-11-20 DIAGNOSIS — Z452 Encounter for adjustment and management of vascular access device: Secondary | ICD-10-CM

## 2016-11-20 DIAGNOSIS — C189 Malignant neoplasm of colon, unspecified: Secondary | ICD-10-CM

## 2016-11-20 DIAGNOSIS — C787 Secondary malignant neoplasm of liver and intrahepatic bile duct: Principal | ICD-10-CM

## 2016-11-20 MED ORDER — SODIUM CHLORIDE 0.9% FLUSH
10.0000 mL | INTRAVENOUS | Status: DC | PRN
  Administered 2016-11-20: 10 mL
  Filled 2016-11-20: qty 10

## 2016-11-20 MED ORDER — HEPARIN SOD (PORK) LOCK FLUSH 100 UNIT/ML IV SOLN
500.0000 [IU] | Freq: Once | INTRAVENOUS | Status: AC | PRN
Start: 2016-11-20 — End: 2016-11-20
  Administered 2016-11-20: 500 [IU]
  Filled 2016-11-20: qty 5

## 2016-11-20 NOTE — Telephone Encounter (Signed)
Scheduled appts per sch message from [facility] - called patient. Patient is aware of appt time and date and is my chart active.

## 2016-11-23 DIAGNOSIS — C189 Malignant neoplasm of colon, unspecified: Secondary | ICD-10-CM | POA: Diagnosis not present

## 2016-11-23 DIAGNOSIS — E7800 Pure hypercholesterolemia, unspecified: Secondary | ICD-10-CM | POA: Diagnosis not present

## 2016-11-23 DIAGNOSIS — E119 Type 2 diabetes mellitus without complications: Secondary | ICD-10-CM | POA: Diagnosis not present

## 2016-12-02 ENCOUNTER — Ambulatory Visit (HOSPITAL_BASED_OUTPATIENT_CLINIC_OR_DEPARTMENT_OTHER): Payer: 59

## 2016-12-02 ENCOUNTER — Ambulatory Visit: Payer: 59

## 2016-12-02 ENCOUNTER — Other Ambulatory Visit: Payer: Self-pay | Admitting: *Deleted

## 2016-12-02 ENCOUNTER — Other Ambulatory Visit (HOSPITAL_BASED_OUTPATIENT_CLINIC_OR_DEPARTMENT_OTHER): Payer: 59

## 2016-12-02 ENCOUNTER — Other Ambulatory Visit: Payer: 59

## 2016-12-02 ENCOUNTER — Telehealth: Payer: Self-pay | Admitting: Hematology

## 2016-12-02 ENCOUNTER — Ambulatory Visit (HOSPITAL_BASED_OUTPATIENT_CLINIC_OR_DEPARTMENT_OTHER): Payer: 59 | Admitting: Hematology

## 2016-12-02 ENCOUNTER — Encounter: Payer: Self-pay | Admitting: Hematology

## 2016-12-02 VITALS — BP 116/82 | HR 90 | Temp 97.8°F | Resp 18 | Wt 248.0 lb

## 2016-12-02 VITALS — BP 119/83

## 2016-12-02 DIAGNOSIS — C189 Malignant neoplasm of colon, unspecified: Secondary | ICD-10-CM

## 2016-12-02 DIAGNOSIS — C182 Malignant neoplasm of ascending colon: Secondary | ICD-10-CM

## 2016-12-02 DIAGNOSIS — Z5112 Encounter for antineoplastic immunotherapy: Secondary | ICD-10-CM | POA: Diagnosis not present

## 2016-12-02 DIAGNOSIS — C787 Secondary malignant neoplasm of liver and intrahepatic bile duct: Secondary | ICD-10-CM | POA: Diagnosis not present

## 2016-12-02 DIAGNOSIS — K922 Gastrointestinal hemorrhage, unspecified: Secondary | ICD-10-CM | POA: Diagnosis not present

## 2016-12-02 DIAGNOSIS — C786 Secondary malignant neoplasm of retroperitoneum and peritoneum: Secondary | ICD-10-CM

## 2016-12-02 DIAGNOSIS — Z5111 Encounter for antineoplastic chemotherapy: Secondary | ICD-10-CM | POA: Diagnosis not present

## 2016-12-02 DIAGNOSIS — D500 Iron deficiency anemia secondary to blood loss (chronic): Secondary | ICD-10-CM | POA: Diagnosis not present

## 2016-12-02 DIAGNOSIS — Z95828 Presence of other vascular implants and grafts: Secondary | ICD-10-CM

## 2016-12-02 LAB — CBC & DIFF AND RETIC
BASO%: 0.6 % (ref 0.0–2.0)
Basophils Absolute: 0 10*3/uL (ref 0.0–0.1)
EOS ABS: 0.1 10*3/uL (ref 0.0–0.5)
EOS%: 2.5 % (ref 0.0–7.0)
HEMATOCRIT: 43.7 % (ref 38.4–49.9)
HEMOGLOBIN: 14.4 g/dL (ref 13.0–17.1)
IMMATURE RETIC FRACT: 3 % (ref 3.00–10.60)
LYMPH%: 31.8 % (ref 14.0–49.0)
MCH: 31.3 pg (ref 27.2–33.4)
MCHC: 33 g/dL (ref 32.0–36.0)
MCV: 95 fL (ref 79.3–98.0)
MONO#: 0.6 10*3/uL (ref 0.1–0.9)
MONO%: 19.8 % — AB (ref 0.0–14.0)
NEUT%: 45.3 % (ref 39.0–75.0)
NEUTROS ABS: 1.5 10*3/uL (ref 1.5–6.5)
PLATELETS: 77 10*3/uL — AB (ref 140–400)
RBC: 4.6 10*6/uL (ref 4.20–5.82)
RDW: 16.5 % — ABNORMAL HIGH (ref 11.0–14.6)
Retic %: 1.8 % (ref 0.80–1.80)
Retic Ct Abs: 82.8 10*3/uL (ref 34.80–93.90)
WBC: 3.2 10*3/uL — AB (ref 4.0–10.3)
lymph#: 1 10*3/uL (ref 0.9–3.3)

## 2016-12-02 LAB — COMPREHENSIVE METABOLIC PANEL
ALBUMIN: 3.7 g/dL (ref 3.5–5.0)
ALK PHOS: 90 U/L (ref 40–150)
ALT: 54 U/L (ref 0–55)
AST: 46 U/L — ABNORMAL HIGH (ref 5–34)
Anion Gap: 11 mEq/L (ref 3–11)
BILIRUBIN TOTAL: 1.09 mg/dL (ref 0.20–1.20)
BUN: 9.8 mg/dL (ref 7.0–26.0)
CO2: 23 mEq/L (ref 22–29)
Calcium: 9.6 mg/dL (ref 8.4–10.4)
Chloride: 106 mEq/L (ref 98–109)
Creatinine: 1 mg/dL (ref 0.7–1.3)
GLUCOSE: 215 mg/dL — AB (ref 70–140)
POTASSIUM: 4.3 meq/L (ref 3.5–5.1)
SODIUM: 139 meq/L (ref 136–145)
TOTAL PROTEIN: 7 g/dL (ref 6.4–8.3)

## 2016-12-02 MED ORDER — LEUCOVORIN CALCIUM INJECTION 350 MG
400.0000 mg/m2 | Freq: Once | INTRAVENOUS | Status: AC
  Administered 2016-12-02: 964 mg via INTRAVENOUS
  Filled 2016-12-02: qty 48.2

## 2016-12-02 MED ORDER — SODIUM CHLORIDE 0.9% FLUSH
10.0000 mL | INTRAVENOUS | Status: DC | PRN
  Administered 2016-12-02: 10 mL via INTRAVENOUS
  Filled 2016-12-02: qty 10

## 2016-12-02 MED ORDER — SODIUM CHLORIDE 0.9 % IV SOLN
Freq: Once | INTRAVENOUS | Status: AC
  Administered 2016-12-02: 12:00:00 via INTRAVENOUS

## 2016-12-02 MED ORDER — PALONOSETRON HCL INJECTION 0.25 MG/5ML
INTRAVENOUS | Status: AC
  Filled 2016-12-02: qty 5

## 2016-12-02 MED ORDER — DEXAMETHASONE SODIUM PHOSPHATE 10 MG/ML IJ SOLN
10.0000 mg | Freq: Once | INTRAMUSCULAR | Status: AC
Start: 2016-12-02 — End: 2016-12-02
  Administered 2016-12-02: 10 mg via INTRAVENOUS

## 2016-12-02 MED ORDER — SODIUM CHLORIDE 0.9 % IV SOLN
5.2000 mg/kg | Freq: Once | INTRAVENOUS | Status: AC
  Administered 2016-12-02: 600 mg via INTRAVENOUS
  Filled 2016-12-02: qty 16

## 2016-12-02 MED ORDER — PALONOSETRON HCL INJECTION 0.25 MG/5ML
0.2500 mg | Freq: Once | INTRAVENOUS | Status: AC
  Administered 2016-12-02: 0.25 mg via INTRAVENOUS

## 2016-12-02 MED ORDER — DEXAMETHASONE SODIUM PHOSPHATE 10 MG/ML IJ SOLN
INTRAMUSCULAR | Status: AC
  Filled 2016-12-02: qty 1

## 2016-12-02 MED ORDER — FLUOROURACIL CHEMO INJECTION 2.5 GM/50ML
400.0000 mg/m2 | Freq: Once | INTRAVENOUS | Status: AC
  Administered 2016-12-02: 950 mg via INTRAVENOUS
  Filled 2016-12-02: qty 19

## 2016-12-02 MED ORDER — SODIUM CHLORIDE 0.9 % IV SOLN
2400.0000 mg/m2 | INTRAVENOUS | Status: DC
  Administered 2016-12-02: 5800 mg via INTRAVENOUS
  Filled 2016-12-02: qty 116

## 2016-12-02 NOTE — Telephone Encounter (Signed)
Appointments scheduled per 4.18.18 LOS. Patient given AVS report and calendars with future scheduled appointments. °

## 2016-12-02 NOTE — Progress Notes (Signed)
Per MD, OK to treat with platelet count of 77. No oxaliplatin today.   Wylene Simmer, BSN, RN 12/02/2016 11:19 AM

## 2016-12-02 NOTE — Progress Notes (Signed)
[name]    HEMATOLOGY/ONCOLOGY CLINIC NOTE  Date of Service: .12/02/2016  Patient Care Team: [name], MD as PCP - General (Family Medicine) [name] M.D. 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 KRAS mutated BRAF, NRAS mutation neg MSI Stable.  PET/CT scan done on 06/25/2016 Show multiple foci of hypermetabolic hepatic metastases (atleast 4-5) and 2 hypermetabolic peritoneal nodules along the dorsal peritoneal surface concerning for peritoneal metastases.  MRI Liver 07/27/2016 - Multiple small liver metastases throughout the right and left hepatic lobes, largest measuring 2.1 cm. 1.2 cm enhancing peritoneal nodule in left paracolic gutter, suspicious for peritoneal metastasis. Other small peritoneal nodules better visualized on recent PET-CT which showed hypermetabolic activity, also suspicious for peritoneal metastases.    TREATMENT  FOLFOX x 10 cycles. Avastin added from Cycle 5  INTERVAL HISTORY  Patient is here for follow-up prior to his 11th cycle of FOLFOX . Patient has gradually developed a grade 1-2 neuropathy in his fingers and toes. He is also starting to develop some progressive thrombocytopenia with platelet counts today of 77 k We shall hold his Oxaloplatin for cycle 11 and 12 in going ahead . He will continue with 5-FU /leucovorin plus Avastin  He and his wife are encouraged to call Dr. Marchelle Gearing office for a follow-up post- 12 cycles of ctx to determine if he would like to get patient's imaging done at [facility] as opposed to [place] planning to do it here. Some mild mouth sores which have now resolved. Discussed using using salt  and baking soda mouthwashes. No fevers or chills. No night sweats. No new abdominal symptoms. Bowel movements have been regular.    MEDICAL HISTORY:  Past Medical History:  Diagnosis Date  . Anemia   . Arthritis   . Cancer ([place])   . Diabetes mellitus without complication (HCC)    diet controlled  . History of blood transfusion   . Hyperlipidemia   . Sleep apnea    cpap  . Wears glasses     SURGICAL HISTORY: Past Surgical History:  Procedure Laterality Date  . COLON SURGERY    . IR GENERIC HISTORICAL  09/24/2016   IR CV LINE INJECTION 09/24/2016 [facility]  . IR GENERIC HISTORICAL  09/24/2016   IR US GUIDE VASC ACCESS RIGHT 09/24/2016 [facility]  . IR GENERIC HISTORICAL  09/24/2016   IR FLUORO GUIDE CV LINE RIGHT 09/24/2016 [facility]  . IR GENERIC HISTORICAL  09/30/2016   IR FLUORO GUIDE PORT INSERTION RIGHT 09/30/2016 Markus Daft, MD [facility]  . IR GENERIC HISTORICAL  09/30/2016   IR US GUIDE VASC ACCESS RIGHT 09/30/2016 Markus Daft, MD [facility]  . IR GENERIC HISTORICAL  09/30/2016   IR REMOVAL TUN ACCESS W/ PORT W/O FL MOD SED 09/30/2016 Markus Daft, MD [facility]  . LAPAROSCOPIC RIGHT HEMI COLECTOMY Right 06/17/2016   Procedure: LAPAROSCOPIC ASSISTED  RIGHT HEMI COLECTOMY;  Surgeon: [name], MD;  Location[facility];  Service: General;  Laterality: Right;  . PORTACATH PLACEMENT Left 07/13/2016   Procedure: INSERTION PORT-A-CATH left subclavian;  Surgeon: [name], MD;  Location: [facility];  Service: General;  Laterality: Left;  . SHOULDER ACROMIOPLASTY Right 12/20/2014   Procedure: SHOULDER ACROMIOPLASTY;  Surgeon: Melrose Nakayama, MD;  Location: [place];  Service: Orthopedics;  Laterality: Right;  . SHOULDER ARTHROSCOPY Right 12/20/2014   Procedure: RIGHT ARTHROSCOPY SHOULDER WITH DEBRIDEMENT;  Surgeon: Melrose Nakayama, MD;  Location: [place];  Service: Orthopedics;  Laterality: Right;  . TESTICLE SURGERY     as teen  . TONSILLECTOMY     . WISDOM TOOTH EXTRACTION      SOCIAL HISTORY: Social History   Social History  . Marital status: Married    Spouse name: N/A  . Number of children: N/A  . Years of education: N/A   Occupational History  . cemetery maintenance    Social History Main Topics  . Smoking status: Former Smoker    Packs/day: 1.50    Years: 29.00    Types: Cigarettes    Quit date: 12/16/2012  . Smokeless tobacco: Never Used     Comment: 1-2 ppd from age [age]y to 2014  . Alcohol use 1.8 oz/week    3 Shots of liquor per week     Comment:  some days  . Drug use: No  . Sexual activity: Not on file   Other Topics Concern  . Not on file   Social History Narrative  . No narrative on file    FAMILY HISTORY: Family History  Problem Relation Age of Onset  . Diabetes Other   . Hyperlipidemia Other   . Hypertension Other   . Breast cancer Maternal Aunt 70  . Prostate cancer Maternal Uncle 75  . Lung cancer Paternal Uncle 66  . Stroke Maternal Grandfather 72  . Cancer Paternal Grandmother 77    dx cancer of pancreas and colon, unknown if separate primaries  . Heart Problems Paternal Grandfather     d. 97  . Prostate cancer Maternal Uncle 60  . Breast cancer Maternal Aunt 75  . Breast cancer Maternal Aunt 68  . Cervical cancer Maternal Aunt 68  . Pancreatic cancer Maternal Aunt 54    d. 58y; heavy smoker  . Colon cancer Paternal Uncle 23    s/p partial colectomy; dx. second colon cancer at age 52-64    ALLERGIES:  is allergic to penicillins.  MEDICATIONS:  Current Outpatient Prescriptions  Medication Sig Dispense Refill  . dexamethasone (DECADRON) 4 MG tablet Take 2 tablets (8 mg total) by mouth daily. Start the day after chemotherapy for 2 days. Take with food. 30 tablet 1  . lidocaine-prilocaine (EMLA) cream Apply to affected area once 30 g 3  . LORazepam (ATIVAN) 0.5 MG tablet Take 1 tablet (0.5 mg total) by mouth every 8 (eight) hours. For anxiety or sleep 30 tablet 0  . Multiple  Vitamin (MULTIVITAMIN) tablet Take 1 tablet by mouth daily.    . ondansetron (ZOFRAN) 8 MG tablet Take 1 tablet (8 mg total) by mouth 2 (two) times daily as needed for refractory nausea / vomiting. Start on day 3 after chemotherapy. 30 tablet 1  . Probiotic Product (PROBIOTIC PO) Take 1 tablet by mouth daily.    . prochlorperazine (COMPAZINE) 10 MG tablet Take 1 tablet (10 mg total) by mouth every 6 (six) hours as needed (Nausea or vomiting). 30 tablet 1  . traMADol (ULTRAM) 50 MG tablet Take 1 tablet (50 mg total) by mouth every 6 (six) hours as needed for moderate pain or severe pain. 30 tablet 0   No current facility-administered medications for this visit.    Facility-Administered Medications Ordered in Other Visits  Medication Dose Route  Frequency Provider Last Rate Last Dose  . fluorouracil (ADRUCIL) 5,800 mg in sodium chloride 0.9 % 134 mL chemo infusion  2,400 mg/m2 (Treatment Plan Recorded) Intravenous 1 day or 1 dose Brunetta Genera, MD      . fluorouracil (ADRUCIL) chemo injection 950 mg  400 mg/m2 (Treatment Plan Recorded) Intravenous Once Brunetta Genera, MD      . leucovorin 964 mg in dextrose 5 % 250 mL infusion  400 mg/m2 (Treatment Plan Recorded) Intravenous Once Brunetta Genera, MD 149 mL/hr at 12/02/16 1239 964 mg at 12/02/16 1239    REVIEW OF SYSTEMS:    10 Point review of Systems was done is negative except as noted above.  PHYSICAL EXAMINATION: ECOG PERFORMANCE STATUS: 1 - Symptomatic but completely ambulatory  . Vitals:   12/02/16 0900  BP: 116/82  Pulse: 90  Resp: 18  Temp: 97.8 F (36.6 C)   Filed Weights   12/02/16 0900  Weight: 248 lb (112.5 kg)   .Body mass index is 31.84 kg/m. GENERAL:alert, in no acute distress and comfortable SKIN: skin color, texture, turgor are normal, no rashes or significant lesions EYES: normal, conjunctiva are pink and non-injected, sclera clear OROPHARYNX:no exudate, no erythema and lips, buccal mucosa, and  tongue normal  NECK: supple, no JVD, thyroid normal size, non-tender, without nodularity LYMPH:  no palpable lymphadenopathy in the cervical, axillary or inguinal LUNGS: clear to auscultation with normal respiratory effort HEART: regular rate & rhythm,  no murmurs and no lower extremity edema ABDOMEN: abdomen soft, bowel sounds normoactive, healing laparoscopic surgical incisions with no discharge look clean. Musculoskeletal: no cyanosis of digits and no clubbing  PSYCH: alert & oriented x 3 with fluent speech NEURO: no focal motor/sensory deficits   LABORATORY DATA:  I have reviewed the data as listed  . CBC Latest Ref Rng & Units 12/02/2016 11/18/2016 11/04/2016  WBC 4.0 - 10.3 10e3/uL 3.2(L) 2.7(L) 3.5(L)  Hemoglobin 13.0 - 17.1 g/dL 14.4 13.8 13.9  Hematocrit 38.4 - 49.9 % 43.7 41.1 41.9  Platelets 140 - 400 10e3/uL 77(L) 81(L) 86(L)    . CMP Latest Ref Rng & Units 12/02/2016 11/18/2016 11/04/2016  Glucose 70 - 140 mg/dl 215(H) 231(H) 193(H)  BUN 7.0 - 26.0 mg/dL 9.8 10.0 4.8(L)  Creatinine 0.7 - 1.3 mg/dL 1.0 0.9 0.9  Sodium 136 - 145 mEq/L 139 139 139  Potassium 3.5 - 5.1 mEq/L 4.3 4.3 4.4  Chloride 101 - 111 mmol/L - - -  CO2 22 - 29 mEq/L 23 22 21(L)  Calcium 8.4 - 10.4 mg/dL 9.6 9.3 9.3  Total Protein 6.4 - 8.3 g/dL 7.0 6.8 6.9  Total Bilirubin 0.20 - 1.20 mg/dL 1.09 0.93 0.88  Alkaline Phos 40 - 150 U/L 90 94 105  AST 5 - 34 U/L 46(H) 54(H) 56(H)  ALT 0 - 55 U/L 54 58(H) 73(H)       Microscopic Comment 2. COLON AND RECTUM (INCLUDING TRANS-ANAL RESECTION): Specimen: Terminal ileum, right colon and appendix Procedure: Segmental resection Tumor site: Proximal ascending colon Specimen integrity: Intact Macroscopic intactness of mesorectum: Not applicable: x Complete: NA Near complete: NA Incomplete: NA Cannot be determined (specify): NA Macroscopic tumor perforation: The mass invades through muscularis propria into pericolonic soft tissue Invasive tumor: Maximum  size: 5.5 cm Histologic type(s): Adenocarcinoma Histologic grade and differentiation: G3 G1: well differentiated/low grade 1 of 4 Supplemental copy SUPPLEMENTAL for Stellmach, Tarik ([id number]) Microscopic Comment(continued) G2: moderately differentiated/low grade G3: poorly differentiated/high grade G4: undifferentiated/high grade Type of polyp  in which invasive carcinoma arose: Tubular adenoma Microscopic extension of invasive tumor: The mass invade through the muscularis propria into pericolonic soft tissue Lymph-Vascular invasion: Identified Peri-neural invasion: Identified Tumor deposit(s) (discontinuous extramural extension): Present Resection margins: Proximal margin: Negative Distal margin: Negative Circumferential (radial) (posterior ascending, posterior descending; lateral and posterior mid-rectum; and entire lower 1/3 rectum):Negative Mesenteric margin (sigmoid and transverse): NA Distance closest margin (if all above margins negative): 3.7 cm from the non peritonealized pericolic soft tissue margin Trans-anal resection margins only: Deep margin: NA Mucosal Margin: NA Distance closest mucosal margin (if negative): NA Treatment effect (neo-adjuvant therapy): NA Additional polyp(s): Negative Non-neoplastic findings: Unremarkable Lymph nodes: number examined 18; number positive: 5 Pathologic Staging: pT3, N2a, M1a Ancillary studies: MSI ordered      RADIOGRAPHIC STUDIES: I have personally reviewed the radiological images as listed and agreed with the findings in the report. No results found. 2018 February MR ABDOMEN AND PELVIS WWOTUMOR FOLLOW UP2/26/2018 [facility] Result Impression    Decreased size of multiple hepatic lesions and two peritoneal nodules compared to outside MRI 07/27/2016. No new lesions identified in the abdomen or pelvis.  Result Narrative  MR ABDOMEN AND PELVIS WITH AND WITHOUT CONTRAST (TUMOR FOLLOW-UP PROTOCOL),  10/12/2016 5:59 PM   INDICATION: liver and peritoneal colon cancer metastases \ C18.9 Metastatic colon cancer to liver (HCC) \ C78.7 Metastatic colon cancer to liver ([place]) \ C78.6 Peritoneal carcinomatosis ([place]) \ C80.1 Peritoneal carcinomatosis ([place])   ADDITIONAL HISTORY: History of stage IV colorectal cancer status post laparoscopic right hemicolectomy on 06/17/2016. At time of surgery, tumor nodule in the right lobe of the liver was positive on biopsy for adenocarcinoma. Patient currently undergoing chemotherapy.  COMPARISON: Outside studies including CT chest abdomen pelvis 05/23/2016, PET CT 07/15/2016, and MR abdomen 07/27/2016   TECHNIQUE: Multiplanar, multisequence MR images of the abdomen and pelvis were obtained before and after intravenous administration of gadolinium-based contrast.   FINDINGS:  LOWER CHEST Heart: Normal size. No pericardial effusion. Lungs/pleura: No masses, consolidations, or effusions.  ABDOMEN Liver: Liver is enlarged, measuring 20 cm in greatest craniocaudal dimension. Hepatic steatosis. Multiple T2 hyperintense and T1 iso to hypointense lesions throughout the liver as marked in series 16, decreased in size compared to outside MR 07/27/2016. Largest lesion is in segment 8 and measures 1.4 cm and demonstrates peripheral enhancement. No new lesion. Gallbladder: Normal. No stones or inflammatory changes. Biliary: No obstruction. Spleen: Spleen is mildly enlarged, measuring 13.8 cm in greatest length. Pancreas: Normal. Adrenals: Normal. Kidneys: Multiple T2 hyperintense nonenhancing lesions in the lower pole of the kidneys bilaterally, consistent with cysts. No hydronephrosis. Stomach/bowel: Right hemicolectomy. Peritoneum: Decreased size of peritoneal nodule in the left paracolic gutter measuring 6 mm (series 10, image 45). Decreased size of peritoneal nodule in the left lower quadrant along the ventral abdominal wall measuring 8 mm (series 23, image 13). No  apparent new measurable lesions. Extraperitoneum: Within normal limits. Vascular: Within normal limits.  PELVIS Ureters: Within normal limits. Bladder: Within normal limits. Reproductive system: Within normal limits.  MSK: Lower midline ventral abdominal wall scar.   CT CHEST WO CONTRAST2/26/2018 [facility] Result Impression   1. Congenital variant of bilateral middle lobes. 2. Groundglass opacification with regions of bronchiectasis in the right lower lobe adjacent to the fissure, consistent with inflammatory/infectious changes. 3. No definitive evidence of metastatic disease to the chest. 4. Probable sebaceous cyst in the subcutaneous tissues beneath the left anterior chest.  Result Narrative  CT CHEST  WO CONTRAST, 10/12/2016 3:24 PM  INDICATION:stage IV colon cancer \ C18.9 Metastatic colon cancer to liver ([place]) \ C78.7 Metastatic colon cancer to liver ([place]) \ C78.6 Peritoneal carcinomatosis ([place]) \ C80.1 Peritoneal carcinomatosis ([place])  COMPARISON: None  TECHNIQUE: Multislice axial images were obtained through the chest without administration of iodinated intravenous contrast material. Multi-planar reformatted images were generated for additional analysis. Nongated technique limits cardiac detail.  All CT scans at [facility] and [facility] are performed using dose optimization techniques as appropriate to a performed exam, including but not limited to one or more of the following: automated exposure control, adjustment of the mA and/or kV according to patient size, use of iterative reconstruction technique. In addition, [facility] is participating in the [place] program which will further assist [place] in optimizing patient radiation exposure.   FINDINGS:   Thoracic inlet/central airways: Parenchyma is within normal limits for age. The trachea is patent. Mediastinum/hila/axilla: Scattered, nonpathologically  enlarged nodes. Heart/vessels: Normal heart size. Trace pericardial effusion. Coronary artery calcifications.. The tip of the central line is in the distal SVC. Lungs/pleura: There are bilateral middle lobes. Subtle groundglass opacification surrounding the posterior aspect of the right major fissure. This is seen in association with some bronchiectasis. Upper abdomen: See concurrent MRI study. Chest wall/MSK: Multi-segmented sternum. 1.5 cm low-density soft tissue nodule in the subcutaneous tissues beneath the left anterior chest.      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 KRAS mutated BRAF, NRAS mutation neg MSI Stable.  PET/CT scan done on 06/25/2016 Show multiple foci of hypermetabolic hepatic metastases (atleast 4-5) and 2 hypermetabolic peritoneal nodules along the dorsal peritoneal surface concerning for peritoneal metastases.  MRI Liver 07/27/2016 - Multiple small liver metastases throughout the right and left hepatic lobes, largest measuring 2.1 cm. 1.2 cm enhancing peritoneal nodule in left paracolic gutter, suspicious for peritoneal metastasis. Other small peritoneal nodules better visualized on recent PET-CT which showed hypermetabolic activity, also suspicious for peritoneal metastases.   MRI Abd 10/12/2016 at [facility] --shows improvement in all the liver and the 2 peritoneal lesions and no new lesions PLAN -Patient tolerated the first 10 cycles of FOLFOX along with Avastin (from Cycle 5) without any prohibitive toxicities. -Now developing progressive thrombo-cytopenia with platelet counts of 77,000 as well as grade 1-2 neuropathy . -We shall drop the patient's Oxaloplatin at this time . -Continue 5-FU/leucovorin plus Avastin . -Follow-up with Dr. Vernard Gambles after completion of  planned 12 cycles of chemotherapy for repeat imaging to determine role for any surgical resection of residual disease in the liver or peritoneum . -We will do follow-up imaging at [place] If so indicated by Dr. Crisoforo Oxford -Will have Dr Crisoforo Oxford weigh in on consideration of hepatic artery infusion to treat/control liver mets. -further treatment after 12 cycles based on imaging results. Will be evaluated for possibility to treat to NED status with Sx/RT/IR needle ablation.    #2 Rt upper jaw dental pain --periodonitis resolved, orthopantogram with no abscess. resolved -peridex mouthwash QID   #3. 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 Lab Results  Component Value Date   IRON 302 (H) 09/23/2016   TIBC 481 (H) 09/23/2016   IRONPCTSAT 63 (H) 09/23/2016   (  Iron and TIBC)  Lab Results  Component Value Date   FERRITIN 121 09/23/2016   Plan -now off PO iron -rpt ferritin in 4 weeks on followup  #4 small furuncle/carbuncle on his back -resolved  #5 Cervalgia due to DDD -tramadol prn  #6  Patient Active Problem List   Diagnosis Date Noted  . 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 Genetic testing 10/30/2016  . Port catheter in place 09/25/2016  . Family history of colon cancer 07/24/2016  . Family history of prostate cancer 07/24/2016  . Metastatic colon cancer to liver ([place]) 07/01/2016  . Right Colon cancer metastasized to liver ([place]) 06/17/2016  . Chronic GI bleeding 05/29/2016  . Diabetes mellitus type 2, diet-controlled ([place]) 05/22/2016  . OSA (obstructive sleep apnea) 05/22/2016  . Hyperlipidemia 05/22/2016  . Anemia due to blood loss, chronic   . Antineoplastic chemotherapy induced pancytopenia (CODE) ([place]) 05/21/2016   -Continue follow-up with primary care physician .Gara Kroner, MD for optimization of diabetes management . Might need basal insulin if his blood sugars are  elevated with chemotherapy/ steroids . -Continue use of CPAP for sleep apnea . -off statins   -continue 5FU/leucovorin + Avastin q2weeks. Discontinuing Oxaloplatin. -f/u with Dr Maureen [place] in 2-3 weeks with scan there. -RTC with Dr Irene Limbo in 4 weeks with labs  All of the patients questions were answered with apparent satisfaction. The patient knows to call the clinic with any problems, questions or concerns.  I spent 20 minutes counseling the patient face to face. The total time spent in the appointment was 25 minutes and more than 50% was on counseling and direct patient cares.    Sullivan Lone MD [place] AAHIVMS [facility] Hematology/Oncology Physician [facility]  (Office):       [phone] (Work cell):  [phone] (Fax):           [phone]

## 2016-12-02 NOTE — Patient Instructions (Signed)
[facility] Discharge Instructions for Patients Receiving Chemotherapy  Today you received the following chemotherapy agents avastin/leucovorin/oxaliplatin/florouracil.    To help prevent nausea and vomiting after your treatment, we encourage you to take your nausea medication as directed.  If you develop nausea and vomiting that is not controlled by your nausea medication, call the clinic.   BELOW ARE SYMPTOMS THAT SHOULD BE REPORTED IMMEDIATELY:  *FEVER GREATER THAN 100.5 F  *CHILLS WITH OR WITHOUT FEVER  NAUSEA AND VOMITING THAT IS NOT CONTROLLED WITH YOUR NAUSEA MEDICATION  *UNUSUAL SHORTNESS OF BREATH  *UNUSUAL BRUISING OR BLEEDING  TENDERNESS IN MOUTH AND THROAT WITH OR WITHOUT PRESENCE OF ULCERS  *URINARY PROBLEMS  *BOWEL PROBLEMS  UNUSUAL RASH Items with * indicate a potential emergency and should be followed up as soon as possible.  Feel free to call the clinic you have any questions or concerns. The clinic phone number is [phone].

## 2016-12-02 NOTE — Patient Instructions (Signed)
Implanted Port Home Guide An implanted port is a type of central line that is placed under the skin. Central lines are used to provide IV access when treatment or nutrition needs to be given through a person's veins. Implanted ports are used for long-term IV access. An implanted port may be placed because:  You need IV medicine that would be irritating to the small veins in your hands or arms.  You need long-term IV medicines, such as antibiotics.  You need IV nutrition for a long period.  You need frequent blood draws for lab tests.  You need dialysis.  Implanted ports are usually placed in the chest area, but they can also be placed in the upper arm, the abdomen, or the leg. An implanted port has two main parts:  Reservoir. The reservoir is round and will appear as a small, raised area under your skin. The reservoir is the part where a needle is inserted to give medicines or draw blood.  Catheter. The catheter is a thin, flexible tube that extends from the reservoir. The catheter is placed into a large vein. Medicine that is inserted into the reservoir goes into the catheter and then into the vein.  How will I care for my incision site? Do not get the incision site wet. Bathe or shower as directed by your health care provider. How is my port accessed? Special steps must be taken to access the port:  Before the port is accessed, a numbing cream can be placed on the skin. This helps numb the skin over the port site.  Your health care provider uses a sterile technique to access the port. ? Your health care provider must put on a mask and sterile gloves. ? The skin over your port is cleaned carefully with an antiseptic and allowed to dry. ? The port is gently pinched between sterile gloves, and a needle is inserted into the port.  Only "non-coring" port needles should be used to access the port. Once the port is accessed, a blood return should be checked. This helps ensure that the port  is in the vein and is not clogged.  If your port needs to remain accessed for a constant infusion, a clear (transparent) bandage will be placed over the needle site. The bandage and needle will need to be changed every week, or as directed by your health care provider.  Keep the bandage covering the needle clean and dry. Do not get it wet. Follow your health care provider's instructions on how to take a shower or bath while the port is accessed.  If your port does not need to stay accessed, no bandage is needed over the port.  What is flushing? Flushing helps keep the port from getting clogged. Follow your health care provider's instructions on how and when to flush the port. Ports are usually flushed with saline solution or a medicine called heparin. The need for flushing will depend on how the port is used.  If the port is used for intermittent medicines or blood draws, the port will need to be flushed: ? After medicines have been given. ? After blood has been drawn. ? As part of routine maintenance.  If a constant infusion is running, the port may not need to be flushed.  How long will my port stay implanted? The port can stay in for as long as your health care provider thinks it is needed. When it is time for the port to come out, surgery will be   done to remove it. The procedure is similar to the one performed when the port was put in. When should I seek immediate medical care? When you have an implanted port, you should seek immediate medical care if:  You notice a bad smell coming from the incision site.  You have swelling, redness, or drainage at the incision site.  You have more swelling or pain at the port site or the surrounding area.  You have a fever that is not controlled with medicine.  This information is not intended to replace advice given to you by your health care provider. Make sure you discuss any questions you have with your health care provider. Document  Released: 08/03/2005 Document Revised: 01/09/2016 Document Reviewed: 04/10/2013 Elsevier Interactive Patient Education  2017 [employer].  

## 2016-12-04 ENCOUNTER — Ambulatory Visit (HOSPITAL_BASED_OUTPATIENT_CLINIC_OR_DEPARTMENT_OTHER): Payer: 59

## 2016-12-04 VITALS — BP 114/69 | HR 81 | Temp 98.2°F | Resp 17

## 2016-12-04 DIAGNOSIS — C182 Malignant neoplasm of ascending colon: Secondary | ICD-10-CM

## 2016-12-04 DIAGNOSIS — C787 Secondary malignant neoplasm of liver and intrahepatic bile duct: Secondary | ICD-10-CM

## 2016-12-04 DIAGNOSIS — C189 Malignant neoplasm of colon, unspecified: Secondary | ICD-10-CM

## 2016-12-04 DIAGNOSIS — Z95828 Presence of other vascular implants and grafts: Secondary | ICD-10-CM

## 2016-12-04 DIAGNOSIS — Z452 Encounter for adjustment and management of vascular access device: Secondary | ICD-10-CM

## 2016-12-04 DIAGNOSIS — D500 Iron deficiency anemia secondary to blood loss (chronic): Secondary | ICD-10-CM

## 2016-12-04 MED ORDER — SODIUM CHLORIDE 0.9% FLUSH
10.0000 mL | INTRAVENOUS | Status: DC | PRN
  Administered 2016-12-04: 10 mL via INTRAVENOUS
  Filled 2016-12-04: qty 10

## 2016-12-04 MED ORDER — HEPARIN SOD (PORK) LOCK FLUSH 100 UNIT/ML IV SOLN
500.0000 [IU] | Freq: Once | INTRAVENOUS | Status: AC | PRN
  Administered 2016-12-04: 500 [IU] via INTRAVENOUS
  Filled 2016-12-04: qty 5

## 2016-12-11 ENCOUNTER — Telehealth: Payer: Self-pay | Admitting: Hematology

## 2016-12-11 NOTE — Telephone Encounter (Signed)
Called patient to confirm appt change per sch message - patient is aware of appt date and time.

## 2016-12-13 DIAGNOSIS — C189 Malignant neoplasm of colon, unspecified: Secondary | ICD-10-CM | POA: Diagnosis not present

## 2016-12-16 ENCOUNTER — Other Ambulatory Visit (HOSPITAL_BASED_OUTPATIENT_CLINIC_OR_DEPARTMENT_OTHER): Payer: 59

## 2016-12-16 ENCOUNTER — Ambulatory Visit (HOSPITAL_BASED_OUTPATIENT_CLINIC_OR_DEPARTMENT_OTHER): Payer: 59

## 2016-12-16 ENCOUNTER — Ambulatory Visit: Payer: 59

## 2016-12-16 VITALS — BP 117/87 | HR 86 | Temp 98.3°F | Resp 16

## 2016-12-16 DIAGNOSIS — C787 Secondary malignant neoplasm of liver and intrahepatic bile duct: Principal | ICD-10-CM

## 2016-12-16 DIAGNOSIS — Z5112 Encounter for antineoplastic immunotherapy: Secondary | ICD-10-CM

## 2016-12-16 DIAGNOSIS — C189 Malignant neoplasm of colon, unspecified: Secondary | ICD-10-CM

## 2016-12-16 DIAGNOSIS — C182 Malignant neoplasm of ascending colon: Secondary | ICD-10-CM | POA: Diagnosis not present

## 2016-12-16 DIAGNOSIS — Z5111 Encounter for antineoplastic chemotherapy: Secondary | ICD-10-CM

## 2016-12-16 DIAGNOSIS — Z79899 Other long term (current) drug therapy: Secondary | ICD-10-CM

## 2016-12-16 DIAGNOSIS — C786 Secondary malignant neoplasm of retroperitoneum and peritoneum: Secondary | ICD-10-CM

## 2016-12-16 LAB — CBC WITH DIFFERENTIAL/PLATELET
BASO%: 0.4 % (ref 0.0–2.0)
BASOS ABS: 0 10*3/uL (ref 0.0–0.1)
EOS%: 2.3 % (ref 0.0–7.0)
Eosinophils Absolute: 0.1 10*3/uL (ref 0.0–0.5)
HEMATOCRIT: 44.3 % (ref 38.4–49.9)
HEMOGLOBIN: 14.7 g/dL (ref 13.0–17.1)
LYMPH#: 1 10*3/uL (ref 0.9–3.3)
LYMPH%: 33 % (ref 14.0–49.0)
MCH: 31.7 pg (ref 27.2–33.4)
MCHC: 33.1 g/dL (ref 32.0–36.0)
MCV: 95.7 fL (ref 79.3–98.0)
MONO#: 0.4 10*3/uL (ref 0.1–0.9)
MONO%: 13.6 % (ref 0.0–14.0)
NEUT#: 1.5 10*3/uL (ref 1.5–6.5)
NEUT%: 50.7 % (ref 39.0–75.0)
Platelets: 69 10*3/uL — ABNORMAL LOW (ref 140–400)
RBC: 4.63 10*6/uL (ref 4.20–5.82)
RDW: 17.4 % — AB (ref 11.0–14.6)
WBC: 2.9 10*3/uL — ABNORMAL LOW (ref 4.0–10.3)

## 2016-12-16 LAB — COMPREHENSIVE METABOLIC PANEL
ALT: 66 U/L — AB (ref 0–55)
AST: 55 U/L — AB (ref 5–34)
Albumin: 3.9 g/dL (ref 3.5–5.0)
Alkaline Phosphatase: 101 U/L (ref 40–150)
Anion Gap: 12 mEq/L — ABNORMAL HIGH (ref 3–11)
BUN: 6 mg/dL — AB (ref 7.0–26.0)
CHLORIDE: 107 meq/L (ref 98–109)
CO2: 21 mEq/L — ABNORMAL LOW (ref 22–29)
Calcium: 9.3 mg/dL (ref 8.4–10.4)
Creatinine: 0.9 mg/dL (ref 0.7–1.3)
EGFR: >90 mL/min/{1.73_m2} (ref >90)
GLUCOSE: 225 mg/dL — AB (ref 70–140)
POTASSIUM: 4.1 meq/L (ref 3.5–5.1)
SODIUM: 140 meq/L (ref 136–145)
Total Bilirubin: 1.09 mg/dL (ref 0.20–1.20)
Total Protein: 7.1 g/dL (ref 6.4–8.3)

## 2016-12-16 LAB — UA PROTEIN, DIPSTICK - CHCC: PROTEIN: NEGATIVE mg/dL

## 2016-12-16 LAB — CEA (IN HOUSE-CHCC): CEA (CHCC-IN HOUSE): 3.6 ng/mL (ref 0.00–5.00)

## 2016-12-16 MED ORDER — PALONOSETRON HCL INJECTION 0.25 MG/5ML
INTRAVENOUS | Status: AC
  Filled 2016-12-16: qty 5

## 2016-12-16 MED ORDER — SODIUM CHLORIDE 0.9 % IV SOLN
Freq: Once | INTRAVENOUS | Status: AC
  Administered 2016-12-16: 10:00:00 via INTRAVENOUS

## 2016-12-16 MED ORDER — DEXAMETHASONE SODIUM PHOSPHATE 10 MG/ML IJ SOLN
10.0000 mg | Freq: Once | INTRAMUSCULAR | Status: AC
  Administered 2016-12-16: 10 mg via INTRAVENOUS

## 2016-12-16 MED ORDER — PALONOSETRON HCL INJECTION 0.25 MG/5ML
0.2500 mg | Freq: Once | INTRAVENOUS | Status: AC
  Administered 2016-12-16: 0.25 mg via INTRAVENOUS

## 2016-12-16 MED ORDER — LEUCOVORIN CALCIUM INJECTION 350 MG
400.0000 mg/m2 | Freq: Once | INTRAMUSCULAR | Status: DC
Start: 2016-12-16 — End: 2016-12-16

## 2016-12-16 MED ORDER — FLUOROURACIL CHEMO INJECTION 2.5 GM/50ML: 400.0000 mg/m2 | Freq: Once | INTRAVENOUS | Status: DC

## 2016-12-16 MED ORDER — SODIUM CHLORIDE 0.9 % IV SOLN
5.3000 mg/kg | Freq: Once | INTRAVENOUS | Status: AC
  Administered 2016-12-16: 600 mg via INTRAVENOUS
  Filled 2016-12-16: qty 16

## 2016-12-16 MED ORDER — SODIUM CHLORIDE 0.9 % IV SOLN
2400.0000 mg/m2 | INTRAVENOUS | Status: DC
  Administered 2016-12-16: 5800 mg via INTRAVENOUS
  Filled 2016-12-16: qty 116

## 2016-12-16 MED ORDER — DEXAMETHASONE SODIUM PHOSPHATE 10 MG/ML IJ SOLN
INTRAMUSCULAR | Status: AC
  Filled 2016-12-16: qty 1

## 2016-12-16 NOTE — Progress Notes (Signed)
Dr. Irene Limbo later decided to omit Leucovorin, also.  Patient received Avastin and 5FU pump only.

## 2016-12-16 NOTE — Patient Instructions (Signed)
[facility] Discharge Instructions for Patients Receiving Chemotherapy  Today you received the following chemotherapy agents:  Oxaliplatin, Leucovorin, 5FU, and Avastin.  To help prevent nausea and vomiting after your treatment, we encourage you to take your nausea medication as directed.   If you develop nausea and vomiting that is not controlled by your nausea medication, call the clinic.   BELOW ARE SYMPTOMS THAT SHOULD BE REPORTED IMMEDIATELY:  *FEVER GREATER THAN 100.5 F  *CHILLS WITH OR WITHOUT FEVER  NAUSEA AND VOMITING THAT IS NOT CONTROLLED WITH YOUR NAUSEA MEDICATION  *UNUSUAL SHORTNESS OF BREATH  *UNUSUAL BRUISING OR BLEEDING  TENDERNESS IN MOUTH AND THROAT WITH OR WITHOUT PRESENCE OF ULCERS  *URINARY PROBLEMS  *BOWEL PROBLEMS  UNUSUAL RASH Items with * indicate a potential emergency and should be followed up as soon as possible.  Feel free to call the clinic you have any questions or concerns. The clinic phone number is [phone].  Please show the CHEMO ALERT CARD at check-in to the Emergency Department and triage nurse.   

## 2016-12-16 NOTE — Progress Notes (Signed)
Pt. c/o "room spinning and dizziness" when changing positions from standing to supine and supine to standing. States he has been experiencing this since Friday, 12/11/2016. Denies CP, nausea, vomiting, flushing, diaphoresis, and/or syncope.  Dr. Irene Limbo aware. No new orders. Encouraged patient to notify office or go to nearest ED if symptoms persist or worsen. Verbalizes understanding.   Dr. Irene Limbo aware of today's platelet count. Will omit 5FU bolus and continue with remainder of tx as ordered.

## 2016-12-18 ENCOUNTER — Ambulatory Visit (HOSPITAL_BASED_OUTPATIENT_CLINIC_OR_DEPARTMENT_OTHER): Payer: 59

## 2016-12-18 VITALS — BP 118/71 | HR 80 | Temp 98.2°F | Resp 16

## 2016-12-18 DIAGNOSIS — C182 Malignant neoplasm of ascending colon: Secondary | ICD-10-CM | POA: Diagnosis not present

## 2016-12-18 DIAGNOSIS — C189 Malignant neoplasm of colon, unspecified: Secondary | ICD-10-CM

## 2016-12-18 DIAGNOSIS — C787 Secondary malignant neoplasm of liver and intrahepatic bile duct: Principal | ICD-10-CM

## 2016-12-18 MED ORDER — HEPARIN SOD (PORK) LOCK FLUSH 100 UNIT/ML IV SOLN
500.0000 [IU] | Freq: Once | INTRAVENOUS | Status: AC | PRN
  Administered 2016-12-18: 500 [IU]
  Filled 2016-12-18: qty 5

## 2016-12-18 MED ORDER — SODIUM CHLORIDE 0.9% FLUSH
10.0000 mL | INTRAVENOUS | Status: DC | PRN
  Administered 2016-12-18: 10 mL
  Filled 2016-12-18: qty 10

## 2016-12-28 ENCOUNTER — Other Ambulatory Visit: Payer: 59

## 2016-12-28 ENCOUNTER — Ambulatory Visit: Payer: 59

## 2016-12-30 ENCOUNTER — Ambulatory Visit: Payer: 59 | Admitting: Hematology

## 2016-12-30 ENCOUNTER — Ambulatory Visit: Payer: 59

## 2016-12-30 ENCOUNTER — Other Ambulatory Visit: Payer: 59

## 2016-12-31 ENCOUNTER — Ambulatory Visit (HOSPITAL_BASED_OUTPATIENT_CLINIC_OR_DEPARTMENT_OTHER): Payer: 59 | Admitting: Hematology

## 2016-12-31 ENCOUNTER — Ambulatory Visit: Payer: 59

## 2016-12-31 ENCOUNTER — Other Ambulatory Visit: Payer: Self-pay | Admitting: *Deleted

## 2016-12-31 ENCOUNTER — Other Ambulatory Visit (HOSPITAL_BASED_OUTPATIENT_CLINIC_OR_DEPARTMENT_OTHER): Payer: 59

## 2016-12-31 ENCOUNTER — Encounter: Payer: Self-pay | Admitting: Hematology

## 2016-12-31 VITALS — BP 130/72 | HR 77 | Temp 99.5°F | Resp 18 | Ht 74.0 in | Wt 250.0 lb

## 2016-12-31 DIAGNOSIS — C787 Secondary malignant neoplasm of liver and intrahepatic bile duct: Secondary | ICD-10-CM

## 2016-12-31 DIAGNOSIS — C189 Malignant neoplasm of colon, unspecified: Secondary | ICD-10-CM | POA: Diagnosis not present

## 2016-12-31 DIAGNOSIS — C182 Malignant neoplasm of ascending colon: Secondary | ICD-10-CM

## 2016-12-31 DIAGNOSIS — C786 Secondary malignant neoplasm of retroperitoneum and peritoneum: Secondary | ICD-10-CM

## 2016-12-31 DIAGNOSIS — D500 Iron deficiency anemia secondary to blood loss (chronic): Secondary | ICD-10-CM

## 2016-12-31 DIAGNOSIS — D696 Thrombocytopenia, unspecified: Secondary | ICD-10-CM

## 2016-12-31 DIAGNOSIS — G620 Drug-induced polyneuropathy: Secondary | ICD-10-CM

## 2016-12-31 DIAGNOSIS — T451X5A Adverse effect of antineoplastic and immunosuppressive drugs, initial encounter: Secondary | ICD-10-CM

## 2016-12-31 DIAGNOSIS — R42 Dizziness and giddiness: Secondary | ICD-10-CM

## 2016-12-31 LAB — COMPREHENSIVE METABOLIC PANEL
ALBUMIN: 3.7 g/dL (ref 3.5–5.0)
ALK PHOS: 97 U/L (ref 40–150)
ALT: 43 U/L (ref 0–55)
AST: 38 U/L — AB (ref 5–34)
Anion Gap: 8 mEq/L (ref 3–11)
BUN: 9.2 mg/dL (ref 7.0–26.0)
CALCIUM: 9.2 mg/dL (ref 8.4–10.4)
CHLORIDE: 106 meq/L (ref 98–109)
CO2: 21 mEq/L — ABNORMAL LOW (ref 22–29)
Creatinine: 0.9 mg/dL (ref 0.7–1.3)
Glucose: 222 mg/dl — ABNORMAL HIGH (ref 70–140)
Potassium: 4.2 mEq/L (ref 3.5–5.1)
SODIUM: 135 meq/L — AB (ref 136–145)
Total Bilirubin: 1.03 mg/dL (ref 0.20–1.20)
Total Protein: 7 g/dL (ref 6.4–8.3)

## 2016-12-31 LAB — CBC & DIFF AND RETIC
BASO%: 0.7 % (ref 0.0–2.0)
BASOS ABS: 0 10*3/uL (ref 0.0–0.1)
EOS ABS: 0.2 10*3/uL (ref 0.0–0.5)
EOS%: 3.7 % (ref 0.0–7.0)
HEMATOCRIT: 44.7 % (ref 38.4–49.9)
HEMOGLOBIN: 14.5 g/dL (ref 13.0–17.1)
IMMATURE RETIC FRACT: 6.4 % (ref 3.00–10.60)
LYMPH#: 1.1 10*3/uL (ref 0.9–3.3)
LYMPH%: 26 % (ref 14.0–49.0)
MCH: 31.9 pg (ref 27.2–33.4)
MCHC: 32.4 g/dL (ref 32.0–36.0)
MCV: 98.2 fL — ABNORMAL HIGH (ref 79.3–98.0)
MONO#: 0.4 10*3/uL (ref 0.1–0.9)
MONO%: 10.1 % (ref 0.0–14.0)
NEUT#: 2.4 10*3/uL (ref 1.5–6.5)
NEUT%: 59.5 % (ref 39.0–75.0)
PLATELETS: 77 10*3/uL — AB (ref 140–400)
RBC: 4.55 10*6/uL (ref 4.20–5.82)
RDW: 16.2 % — AB (ref 11.0–14.6)
RETIC %: 2.23 % — AB (ref 0.80–1.80)
RETIC CT ABS: 101.47 10*3/uL — AB (ref 34.80–93.90)
WBC: 4 10*3/uL (ref 4.0–10.3)

## 2016-12-31 LAB — FERRITIN: FERRITIN: 108 ng/mL (ref 22–316)

## 2016-12-31 MED ORDER — DULOXETINE HCL 30 MG PO CPEP: 30.0000 mg | ORAL_CAPSULE | Freq: Every day | ORAL | 1 refills | Status: DC

## 2016-12-31 MED ORDER — SODIUM CHLORIDE 0.9% FLUSH
10.0000 mL | INTRAVENOUS | Status: DC | PRN
  Administered 2016-12-31: 10 mL via INTRAVENOUS
  Filled 2016-12-31: qty 10

## 2016-12-31 MED ORDER — HEPARIN SOD (PORK) LOCK FLUSH 100 UNIT/ML IV SOLN
500.0000 [IU] | Freq: Once | INTRAVENOUS | Status: AC
  Administered 2016-12-31: 500 [IU] via INTRAVENOUS
  Filled 2016-12-31: qty 5

## 2016-12-31 NOTE — Progress Notes (Signed)
[name]    HEMATOLOGY/ONCOLOGY CLINIC NOTE  Date of Service: .12/31/2016  Patient Care Team: [name], MD as PCP - General (Family Medicine) [name] M.D. 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 KRAS mutated BRAF, NRAS mutation neg MSI Stable.  PET/CT scan done on 06/25/2016 Show multiple foci of hypermetabolic hepatic metastases (atleast 4-5) and 2 hypermetabolic peritoneal nodules along the dorsal peritoneal surface concerning for peritoneal metastases.  MRI Liver 07/27/2016 - Multiple small liver metastases throughout the right and left hepatic lobes, largest measuring 2.1 cm. 1.2 cm enhancing peritoneal nodule in left paracolic gutter, suspicious for peritoneal metastasis. Other small peritoneal nodules better visualized on recent PET-CT which showed hypermetabolic activity, also suspicious for peritoneal metastases.   CT CHEST WO CONTRAST2/26/2018 [facility] Result Impression   1. Congenital variant of bilateral middle lobes. 2. Groundglass opacification with regions of bronchiectasis in the right lower lobe adjacent to the fissure, consistent with inflammatory/infectious changes. 3. No definitive evidence of metastatic disease to the chest. 4. Probable sebaceous cyst in the subcutaneous tissues beneath the left anterior chest.   MR ABDOMEN AND PELVIS HUTMLYYT FOLLOW UP2/26/2018 [facility] Result Impression    Decreased size of multiple hepatic lesions and two peritoneal nodules compared to outside MRI 07/27/2016. No new lesions identified in the abdomen or pelvis.      TREATMENT  FOLFOX x 10 cycles. Avastin added from Cycle 5 Cycle 11 and 12 with 5FU/leucovorin + Avastin   INTERVAL HISTORY  Patient is here for follow-up after his 12 planned doses of FOLFOX plus Avastin. Last 2 cycles were done without Oxaloplatin due to grade 1-2 neuropathy and thrombocytopenia.  Patient notes he is doing well overall but feels like his neuropathy is still bothersome and grade 2. Could have an element of this from his Avastin as well. Discussed and obtained consent to send prescription for Cymbalta which he is willing to try for his neuropathy.  Still having some thrombocytopenia with platelet counts of 77k.  He is scheduled for his follow-up CT scan of the abdomen and pelvis and a follow-up with Dr. Crisoforo Oxford tomorrow.  We decided to hold off on starting his maintenance 5FU/ Leucovorin Avastin from today pending reassessment and to allow for improvement and has neuropathy and thrombocytopenia.  He is having his tooth extracted on 01/19/2017 with his dentist. No overt infection at this time but lost his dental crown and chipped his tooth.  He was recommended to use appropriate sun protection since his skin might be more sensitive to getting sunburned being on chemotherapy especially with 5-FU.  His blood sugars have been running in the 200s. We discussed that he will call Dr. Moreen Fowler to discuss appropriate diabetes management to try to avoid additional risk factors for his peripheral neuropathy.  No fevers or chills. No night sweats. No new abdominal symptoms. Bowel movements have been regular.  MEDICAL HISTORY:  Past Medical History:  Diagnosis Date  . Anemia   . Arthritis   . Cancer ([place])   . Diabetes mellitus without complication (HCC)    diet controlled  . History of blood transfusion   . Hyperlipidemia   . Sleep apnea    cpap  .  Wears glasses     SURGICAL HISTORY: Past Surgical History:  Procedure Laterality Date  . COLON SURGERY    . IR GENERIC  HISTORICAL  09/24/2016   IR CV LINE INJECTION 09/24/2016 [facility]  . IR GENERIC HISTORICAL  09/24/2016   IR US GUIDE VASC ACCESS RIGHT 09/24/2016 [facility]  . IR GENERIC HISTORICAL  09/24/2016   IR FLUORO GUIDE CV LINE RIGHT 09/24/2016 [facility]  . IR GENERIC HISTORICAL  09/30/2016   IR FLUORO GUIDE PORT INSERTION RIGHT 09/30/2016 Markus Daft, MD [facility]  . IR GENERIC HISTORICAL  09/30/2016   IR US GUIDE VASC ACCESS RIGHT 09/30/2016 Markus Daft, MD [facility]  . IR GENERIC HISTORICAL  09/30/2016   IR REMOVAL TUN ACCESS W/ PORT W/O FL MOD SED 09/30/2016 Markus Daft, MD [facility]  . LAPAROSCOPIC RIGHT HEMI COLECTOMY Right 06/17/2016   Procedure: LAPAROSCOPIC ASSISTED  RIGHT HEMI COLECTOMY;  Surgeon: [name], MD;  Location: [facility];  Service: General;  Laterality: Right;  . PORTACATH PLACEMENT Left 07/13/2016   Procedure: INSERTION PORT-A-CATH left subclavian;  Surgeon: [name], MD;  Location: [facility];  Service: General;  Laterality: Left;  . SHOULDER ACROMIOPLASTY Right 12/20/2014   Procedure: SHOULDER ACROMIOPLASTY;  Surgeon: Melrose Nakayama, MD;  Location: [place];  Service: Orthopedics;  Laterality: Right;  . SHOULDER ARTHROSCOPY Right 12/20/2014   Procedure: RIGHT ARTHROSCOPY SHOULDER WITH DEBRIDEMENT;  Surgeon: Melrose Nakayama, MD;  Location: [place];  Service: Orthopedics;  Laterality: Right;  . TESTICLE SURGERY     as teen  . TONSILLECTOMY    . WISDOM TOOTH EXTRACTION      SOCIAL HISTORY: Social History   Social History  . Marital status: Married    Spouse name: N/A  . Number of children: N/A  . Years of education: N/A   Occupational History  . cemetery maintenance    Social History Main Topics  . Smoking status: Former Smoker    Packs/day: 1.50    Years: 29.00    Types: Cigarettes    Quit date: 12/16/2012  . Smokeless tobacco: Never Used     Comment: 1-2 ppd from age [age]y to 2014  . Alcohol use 1.8 oz/week    3 Shots of  liquor per week     Comment:  some days  . Drug use: No  . Sexual activity: Not on file   Other Topics Concern  . Not on file   Social History Narrative  . No narrative on file    FAMILY HISTORY: Family History  Problem Relation Age of Onset  . Diabetes Other   . Hyperlipidemia Other   . Hypertension Other   . Breast cancer Maternal Aunt 70  . Prostate cancer Maternal Uncle 75  . Lung cancer Paternal Uncle 74  . Stroke Maternal Grandfather 72  . Cancer Paternal Grandmother 64       dx cancer of pancreas and colon, unknown if separate primaries  . Heart Problems Paternal Grandfather        d. 33  . Prostate cancer Maternal Uncle 45  . Breast cancer Maternal Aunt 75  . Breast cancer Maternal Aunt 68  . Cervical cancer Maternal Aunt 68  . Pancreatic cancer Maternal Aunt 54       d. 58y; heavy smoker  . Colon cancer Paternal Uncle 92       s/p partial colectomy; dx. second colon cancer at age 37-64    ALLERGIES:  is allergic  to penicillins.  MEDICATIONS:  Current Outpatient Prescriptions  Medication Sig Dispense Refill  . dexamethasone (DECADRON) 4 MG tablet Take 2 tablets (8 mg total) by mouth daily. Start the day after chemotherapy for 2 days. Take with food. 30 tablet 1  . DULoxetine (CYMBALTA) 30 MG capsule Take 1 capsule (30 mg total) by mouth daily. 30 capsule 1  . lidocaine-prilocaine (EMLA) cream Apply to affected area once 30 g 3  . LORazepam (ATIVAN) 0.5 MG tablet Take 1 tablet (0.5 mg total) by mouth every 8 (eight) hours. For anxiety or sleep 30 tablet 0  . Multiple Vitamin (MULTIVITAMIN) tablet Take 1 tablet by mouth daily.    . ondansetron (ZOFRAN) 8 MG tablet Take 1 tablet (8 mg total) by mouth 2 (two) times daily as needed for refractory nausea / vomiting. Start on day 3 after chemotherapy. 30 tablet 1  . Probiotic Product (PROBIOTIC PO) Take 1 tablet by mouth daily.    . prochlorperazine (COMPAZINE) 10 MG tablet Take 1 tablet (10 mg total) by mouth every 6  (six) hours as needed (Nausea or vomiting). 30 tablet 1   No current facility-administered medications for this visit.     REVIEW OF SYSTEMS:    10 Point review of Systems was done is negative except as noted above.  PHYSICAL EXAMINATION: ECOG PERFORMANCE STATUS: 1 - Symptomatic but completely ambulatory  . Vitals:   12/31/16 1024  BP: 130/72  Pulse: 77  Resp: 18  Temp: 99.5 F (37.5 C)   Filed Weights   12/31/16 1024  Weight: 250 lb (113.4 kg)   .Body mass index is 32.1 kg/m. GENERAL:alert, in no acute distress and comfortable SKIN: skin color, texture, turgor are normal, no rashes or significant lesions EYES: normal, conjunctiva are pink and non-injected, sclera clear OROPHARYNX:no exudate, no erythema and lips, buccal mucosa, and tongue normal  NECK: supple, no JVD, thyroid normal size, non-tender, without nodularity LYMPH:  no palpable lymphadenopathy in the cervical, axillary or inguinal LUNGS: clear to auscultation with normal respiratory effort HEART: regular rate & rhythm,  no murmurs and no lower extremity edema ABDOMEN: abdomen soft, bowel sounds normoactive, healing laparoscopic surgical incisions with no discharge look clean. Musculoskeletal: no cyanosis of digits and no clubbing  PSYCH: alert & oriented x 3 with fluent speech NEURO: no focal motor/sensory deficits   LABORATORY DATA:  I have reviewed the data as listed  . CBC Latest Ref Rng & Units 12/31/2016 12/16/2016 12/02/2016  WBC 4.0 - 10.3 10e3/uL 4.0 2.9(L) 3.2(L)  Hemoglobin 13.0 - 17.1 g/dL 91.1 79.9 40.5  Hematocrit 38.4 - 49.9 % 44.7 44.3 43.7  Platelets 140 - 400 10e3/uL 77(L) 69(L) 77(L)    . CMP Latest Ref Rng & Units 12/31/2016 12/16/2016 12/02/2016  Glucose 70 - 140 mg/dl 003(C) 011(E) 686(M)  BUN 7.0 - 26.0 mg/dL 9.2 6.0(L) 9.8  Creatinine 0.7 - 1.3 mg/dL 0.9 0.9 1.0  Sodium 859 - 145 mEq/L 135(L) 140 139  Potassium 3.5 - 5.1 mEq/L 4.2 4.1 4.3  Chloride 101 - 111 mmol/L - - -  CO2 22  - 29 mEq/L 21(L) 21(L) 23  Calcium 8.4 - 10.4 mg/dL 9.2 9.3 9.6  Total Protein 6.4 - 8.3 g/dL 7.0 7.1 7.0  Total Bilirubin 0.20 - 1.20 mg/dL 8.48 8.58 4.18  Alkaline Phos 40 - 150 U/L 97 101 90  AST 5 - 34 U/L 38(H) 55(H) 46(H)  ALT 0 - 55 U/L 43 66(H) 54       Microscopic  Comment 2. COLON AND RECTUM (INCLUDING TRANS-ANAL RESECTION): Specimen: Terminal ileum, right colon and appendix Procedure: Segmental resection Tumor site: Proximal ascending colon Specimen integrity: Intact Macroscopic intactness of mesorectum: Not applicable: x Complete: NA Near complete: NA Incomplete: NA Cannot be determined (specify): NA Macroscopic tumor perforation: The mass invades through muscularis propria into pericolonic soft tissue Invasive tumor: Maximum size: 5.5 cm Histologic type(s): Adenocarcinoma Histologic grade and differentiation: G3 G1: well differentiated/low grade 1 of 4 Supplemental copy SUPPLEMENTAL for Oehler, Aadyn ([id number]) Microscopic Comment(continued) G2: moderately differentiated/low grade G3: poorly differentiated/high grade G4: undifferentiated/high grade Type of polyp in which invasive carcinoma arose: Tubular adenoma Microscopic extension of invasive tumor: The mass invade through the muscularis propria into pericolonic soft tissue Lymph-Vascular invasion: Identified Peri-neural invasion: Identified Tumor deposit(s) (discontinuous extramural extension): Present Resection margins: Proximal margin: Negative Distal margin: Negative Circumferential (radial) (posterior ascending, posterior descending; lateral and posterior mid-rectum; and entire lower 1/3 rectum):Negative Mesenteric margin (sigmoid and transverse): NA Distance closest margin (if all above margins negative): 3.7 cm from the non peritonealized pericolic soft tissue margin Trans-anal resection margins only: Deep margin: NA Mucosal Margin: NA Distance closest mucosal margin (if negative):  NA Treatment effect (neo-adjuvant therapy): NA Additional polyp(s): Negative Non-neoplastic findings: Unremarkable Lymph nodes: number examined 18; number positive: 5 Pathologic Staging: pT3, N2a, M1a Ancillary studies: MSI ordered      RADIOGRAPHIC STUDIES: I have personally reviewed the radiological images as listed and agreed with the findings in the report. No results found. 2018 February MR ABDOMEN AND PELVIS WWOTUMOR FOLLOW UP2/26/2018 [facility] Result Impression    Decreased size of multiple hepatic lesions and two peritoneal nodules compared to outside MRI 07/27/2016. No new lesions identified in the abdomen or pelvis.  Result Narrative  MR ABDOMEN AND PELVIS WITH AND WITHOUT CONTRAST (TUMOR FOLLOW-UP PROTOCOL), 10/12/2016 5:59 PM   INDICATION: liver and peritoneal colon cancer metastases \ C18.9 Metastatic colon cancer to liver (HCC) \ C78.7 Metastatic colon cancer to liver ([place]) \ C78.6 Peritoneal carcinomatosis ([place]) \ C80.1 Peritoneal carcinomatosis ([place])   ADDITIONAL HISTORY: History of stage IV colorectal cancer status post laparoscopic right hemicolectomy on 06/17/2016. At time of surgery, tumor nodule in the right lobe of the liver was positive on biopsy for adenocarcinoma. Patient currently undergoing chemotherapy.  COMPARISON: Outside studies including CT chest abdomen pelvis 05/23/2016, PET CT 07/15/2016, and MR abdomen 07/27/2016   TECHNIQUE: Multiplanar, multisequence MR images of the abdomen and pelvis were obtained before and after intravenous administration of gadolinium-based contrast.   FINDINGS:  LOWER CHEST Heart: Normal size. No pericardial effusion. Lungs/pleura: No masses, consolidations, or effusions.  ABDOMEN Liver: Liver is enlarged, measuring 20 cm in greatest craniocaudal dimension. Hepatic steatosis. Multiple T2 hyperintense and T1 iso to hypointense lesions throughout the liver as marked in series 16, decreased  in size compared to outside MR 07/27/2016. Largest lesion is in segment 8 and measures 1.4 cm and demonstrates peripheral enhancement. No new lesion. Gallbladder: Normal. No stones or inflammatory changes. Biliary: No obstruction. Spleen: Spleen is mildly enlarged, measuring 13.8 cm in greatest length. Pancreas: Normal. Adrenals: Normal. Kidneys: Multiple T2 hyperintense nonenhancing lesions in the lower pole of the kidneys bilaterally, consistent with cysts. No hydronephrosis. Stomach/bowel: Right hemicolectomy. Peritoneum: Decreased size of peritoneal nodule in the left paracolic gutter measuring 6 mm (series 10, image 45). Decreased size of peritoneal nodule in the left lower quadrant along the ventral abdominal wall measuring 8 mm (series 23, image 13). No apparent new measurable lesions.  Extraperitoneum: Within normal limits. Vascular: Within normal limits.  PELVIS Ureters: Within normal limits. Bladder: Within normal limits. Reproductive system: Within normal limits.  MSK: Lower midline ventral abdominal wall scar.   CT CHEST WO CONTRAST2/26/2018 [facility] Result Impression   1. Congenital variant of bilateral middle lobes. 2. Groundglass opacification with regions of bronchiectasis in the right lower lobe adjacent to the fissure, consistent with inflammatory/infectious changes. 3. No definitive evidence of metastatic disease to the chest. 4. Probable sebaceous cyst in the subcutaneous tissues beneath the left anterior chest.  Result Narrative  CT CHEST WO CONTRAST, 10/12/2016 3:24 PM  INDICATION:stage IV colon cancer \ C18.9 Metastatic colon cancer to liver ([place]) \ C78.7 Metastatic colon cancer to liver ([place]) \ C78.6 Peritoneal carcinomatosis ([place]) \ C80.1 Peritoneal carcinomatosis ([place])  COMPARISON: None  TECHNIQUE: Multislice axial images were obtained through the chest without administration of iodinated intravenous contrast material. Multi-planar  reformatted images were generated for additional analysis. Nongated technique limits cardiac detail.  All [facility] scans at [facility] and [place] are performed using dose optimization techniques as appropriate to a performed exam, including but not limited to one or more of the following: automated exposure control, adjustment of the mA and/or kV according to patient size, use of iterative reconstruction technique. In addition, [facility] is participating in the [place] program which will further assist [place] in optimizing patient radiation exposure.   FINDINGS:   Thoracic inlet/central airways: Parenchyma is within normal limits for age. The trachea is patent. Mediastinum/hila/axilla: Scattered, nonpathologically enlarged nodes. Heart/vessels: Normal heart size. Trace pericardial effusion. Coronary artery calcifications.. The tip of the central line is in the distal SVC. Lungs/pleura: There are bilateral middle lobes. Subtle groundglass opacification surrounding the posterior aspect of the right major fissure. This is seen in association with some bronchiectasis. Upper abdomen: See concurrent MRI study. Chest wall/MSK: Multi-segmented sternum. 1.5 cm low-density soft tissue nodule in the subcutaneous tissues beneath the left anterior chest.      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 KRAS mutated BRAF, NRAS mutation neg MSI Stable.  PET/CT scan done on 06/25/2016 Show multiple foci of hypermetabolic hepatic metastases (atleast 4-5) and 2 hypermetabolic peritoneal nodules along the dorsal peritoneal surface concerning for peritoneal metastases.  MRI Liver 07/27/2016 - Multiple small liver metastases throughout the right and left hepatic  lobes, largest measuring 2.1 cm. 1.2 cm enhancing peritoneal nodule in left paracolic gutter, suspicious for peritoneal metastasis. Other small peritoneal nodules better visualized on recent PET-CT which showed hypermetabolic activity, also suspicious for peritoneal metastases.   MRI Abd 10/12/2016 at [facility] --shows improvement in all the liver and the 2 peritoneal lesions and no new lesions.  CEA level continues to improve.   PLAN -Patient completed 12 cycles of FOLFOX (last 2 cycles without Oxaloplatin) along with Avastin (from Cycle 5) without any prohibitive toxicities. -Now developing some thrombocytopenia with platelet counts of 77,000 as well as grade 1-2 neuropathy . -We shall hold his 5-FU/leucovorin plus Avastin due today to allow him to recover from his thrombocytopenia and neuropathy and to reevaluate disease status with his planned CT and follow-up with Dr. Crisoforo Oxford tomorrow. -We shall reschedule his maintenance chemotherapy in 2 weeks. -We will elevate input from Dr. Crisoforo Oxford regarding any role for surgical metastatectomy  of residual liver and peritoneal lesions. -Alternatively might consider RT or needle ablation to try to achieve NED status if possible. -His CEA levels have continued trend down which is good to know -Patient was recommended to follow up with Dr. Moreen Fowler to optimize control of his diabetes. -Sent a prescription for Cymbalta 30 mg by mouth daily to help with his neuropathy.   #2 Rt upper jaw dental pain --periodonitis resolved, orthopantogram with no abscess. resolved Has a tooth extraction planned on 01/19/2017  #3. 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 Lab Results  Component Value Date   IRON 302 (H) 09/23/2016   TIBC 481 (H) 09/23/2016   IRONPCTSAT 63 (H) 09/23/2016   (Iron and TIBC)  Lab Results  Component Value Date   FERRITIN 121 09/23/2016   Plan -now off PO  iron -rpt ferritin Pending today  #5 Cervalgia due to DDD -tramadol prn  #6  Patient Active Problem List   Diagnosis Date Noted  . Chemotherapy-induced neuropathy ([place]) 11/18/2016  . Genetic testing 10/30/2016  . Port catheter in place 09/25/2016  . Family history of colon cancer 07/24/2016  . Family history of prostate cancer 07/24/2016  . Metastatic colon cancer to liver ([place]) 07/01/2016  . Right Colon cancer metastasized to liver ([place]) 06/17/2016  . Chronic GI bleeding 05/29/2016  . Diabetes mellitus type 2, diet-controlled ([place]) 05/22/2016  . OSA (obstructive sleep apnea) 05/22/2016  . Hyperlipidemia 05/22/2016  . Anemia due to blood loss, chronic   . Antineoplastic chemotherapy induced pancytopenia (CODE) ([place]) 05/21/2016   -Continue follow-up with primary care physician .[name], MD for optimization of diabetes management . Might need basal insulin if his blood sugars are elevated with chemotherapy/ steroids . -Continue use of CPAP for sleep apnea . -off statins   No chemotherapy today Re-schedule 5FU/leucovorin + Avastin -- in 2 weeks with next due cycle RTC with Dr Irene Limbo in 2 weeks with labs   All of the patients questions were answered with apparent satisfaction. The patient knows to call the clinic with any problems, questions or concerns.  I spent 25 minutes counseling the patient face to face. The total time spent in the appointment was 40 minutes and more than 50% was on counseling and direct patient cares.    Sullivan Lone MD [place] AAHIVMS [facility] Hematology/Oncology Physician [facility]  (Office):       [phone] (Work cell):  [phone] (Fax):           [phone]

## 2016-12-31 NOTE — Addendum Note (Signed)
Addended by: Sullivan Lone on: 12/31/2016 12:55 PM   Modules accepted: Orders

## 2016-12-31 NOTE — Patient Instructions (Signed)
Thank you for choosing [facility] to provide your oncology and hematology care.  To afford each patient quality time with our providers, please arrive 30 minutes before your scheduled appointment time.  If you arrive late for your appointment, you may be asked to reschedule.  We strive to give you quality time with our providers, and arriving late affects you and other patients whose appointments are after yours.  If you are a no show for multiple scheduled visits, you may be dismissed from the clinic at the providers discretion.   Again, thank you for choosing [facility], our hope is that these requests will decrease the amount of time that you wait before being seen by our physicians.  ______________________________________________________________________ Should you have questions after your visit to the [facility], please contact our office at [phone] between the hours of 8:30 and 4:30 p.m.    Voicemails left after 4:30p.m will not be returned until the following business day.   For prescription refill requests, please have your pharmacy contact us directly.  Please also try to allow 48 hours for prescription requests.   Please contact the scheduling department for questions regarding scheduling.  For scheduling of procedures such as PET scans, CT scans, MRI, Ultrasound, etc please contact central scheduling at [phone].   Resources For Cancer Patients and Caregivers:  American Cancer Society:  800-227-2345  Can help patients locate various types of support and financial assistance Cancer Care: 1-800-813-HOPE (4673) Provides financial assistance, online support groups, medication/co-pay assistance.   [place] DSS:  336-641-3447 Where to apply for food stamps, Medicaid, and utility assistance Medicare Rights Center: 800-333-4114 Helps people with Medicare understand their rights and benefits, navigate the Medicare system, and secure the  quality healthcare they deserve SCAT: 336-333-6589 [place] [employer]'s shared-ride transportation service for eligible riders who have a disability that prevents them from riding the fixed route bus.   For additional information on assistance programs please contact our social worker:   Grier Hock/Abigail Elmore:  [phone] 

## 2016-12-31 NOTE — Addendum Note (Signed)
Addended by: Kellie Simmering A on: 12/31/2016 11:31 AM   Modules accepted: Orders, SmartSet

## 2017-01-01 DIAGNOSIS — C787 Secondary malignant neoplasm of liver and intrahepatic bile duct: Secondary | ICD-10-CM | POA: Diagnosis not present

## 2017-01-01 DIAGNOSIS — C19 Malignant neoplasm of rectosigmoid junction: Secondary | ICD-10-CM | POA: Diagnosis not present

## 2017-01-01 DIAGNOSIS — C189 Malignant neoplasm of colon, unspecified: Secondary | ICD-10-CM | POA: Diagnosis not present

## 2017-01-01 DIAGNOSIS — K760 Fatty (change of) liver, not elsewhere classified: Secondary | ICD-10-CM | POA: Diagnosis not present

## 2017-01-12 DIAGNOSIS — C189 Malignant neoplasm of colon, unspecified: Secondary | ICD-10-CM | POA: Diagnosis not present

## 2017-01-14 ENCOUNTER — Encounter: Payer: Self-pay | Admitting: Hematology

## 2017-01-14 ENCOUNTER — Ambulatory Visit (HOSPITAL_BASED_OUTPATIENT_CLINIC_OR_DEPARTMENT_OTHER): Payer: 59

## 2017-01-14 ENCOUNTER — Telehealth: Payer: Self-pay | Admitting: Hematology

## 2017-01-14 ENCOUNTER — Ambulatory Visit: Payer: 59

## 2017-01-14 ENCOUNTER — Other Ambulatory Visit: Payer: Self-pay | Admitting: *Deleted

## 2017-01-14 ENCOUNTER — Encounter: Payer: Self-pay | Admitting: *Deleted

## 2017-01-14 ENCOUNTER — Ambulatory Visit (HOSPITAL_BASED_OUTPATIENT_CLINIC_OR_DEPARTMENT_OTHER): Payer: 59 | Admitting: Hematology

## 2017-01-14 ENCOUNTER — Other Ambulatory Visit (HOSPITAL_BASED_OUTPATIENT_CLINIC_OR_DEPARTMENT_OTHER): Payer: 59

## 2017-01-14 VITALS — BP 124/76 | HR 77 | Temp 98.6°F | Resp 18 | Ht 74.0 in | Wt 249.6 lb

## 2017-01-14 DIAGNOSIS — C189 Malignant neoplasm of colon, unspecified: Secondary | ICD-10-CM | POA: Diagnosis not present

## 2017-01-14 DIAGNOSIS — C182 Malignant neoplasm of ascending colon: Secondary | ICD-10-CM | POA: Diagnosis not present

## 2017-01-14 DIAGNOSIS — Z5111 Encounter for antineoplastic chemotherapy: Secondary | ICD-10-CM

## 2017-01-14 DIAGNOSIS — C786 Secondary malignant neoplasm of retroperitoneum and peritoneum: Secondary | ICD-10-CM

## 2017-01-14 DIAGNOSIS — E119 Type 2 diabetes mellitus without complications: Secondary | ICD-10-CM | POA: Diagnosis not present

## 2017-01-14 DIAGNOSIS — C787 Secondary malignant neoplasm of liver and intrahepatic bile duct: Principal | ICD-10-CM

## 2017-01-14 LAB — COMPREHENSIVE METABOLIC PANEL
ALK PHOS: 102 U/L (ref 40–150)
ALT: 28 U/L (ref 0–55)
ANION GAP: 8 meq/L (ref 3–11)
AST: 33 U/L (ref 5–34)
Albumin: 3.8 g/dL (ref 3.5–5.0)
BILIRUBIN TOTAL: 1.07 mg/dL (ref 0.20–1.20)
BUN: 9.4 mg/dL (ref 7.0–26.0)
CO2: 25 meq/L (ref 22–29)
Calcium: 9.4 mg/dL (ref 8.4–10.4)
Chloride: 107 mEq/L (ref 98–109)
Creatinine: 0.8 mg/dL (ref 0.7–1.3)
GLUCOSE: 162 mg/dL — AB (ref 70–140)
POTASSIUM: 3.9 meq/L (ref 3.5–5.1)
SODIUM: 140 meq/L (ref 136–145)
TOTAL PROTEIN: 6.9 g/dL (ref 6.4–8.3)

## 2017-01-14 LAB — CBC & DIFF AND RETIC
BASO%: 0.8 % (ref 0.0–2.0)
Basophils Absolute: 0 10*3/uL (ref 0.0–0.1)
EOS%: 6.2 % (ref 0.0–7.0)
Eosinophils Absolute: 0.2 10*3/uL (ref 0.0–0.5)
HCT: 45 % (ref 38.4–49.9)
HGB: 14.7 g/dL (ref 13.0–17.1)
Immature Retic Fract: 4.6 % (ref 3.00–10.60)
LYMPH%: 27.3 % (ref 14.0–49.0)
MCH: 31.7 pg (ref 27.2–33.4)
MCHC: 32.7 g/dL (ref 32.0–36.0)
MCV: 97.2 fL (ref 79.3–98.0)
MONO#: 0.5 10*3/uL (ref 0.1–0.9)
MONO%: 11.6 % (ref 0.0–14.0)
NEUT%: 54.1 % (ref 39.0–75.0)
NEUTROS ABS: 2.1 10*3/uL (ref 1.5–6.5)
PLATELETS: 96 10*3/uL — AB (ref 140–400)
RBC: 4.63 10*6/uL (ref 4.20–5.82)
RDW: 14.9 % — AB (ref 11.0–14.6)
Retic %: 1.63 % (ref 0.80–1.80)
Retic Ct Abs: 75.47 10*3/uL (ref 34.80–93.90)
WBC: 3.9 10*3/uL — AB (ref 4.0–10.3)
lymph#: 1.1 10*3/uL (ref 0.9–3.3)

## 2017-01-14 MED ORDER — DULOXETINE HCL 60 MG PO CPEP: 60.0000 mg | ORAL_CAPSULE | Freq: Every day | ORAL | 2 refills | Status: DC

## 2017-01-14 MED ORDER — SODIUM CHLORIDE 0.9% FLUSH
10.0000 mL | Freq: Once | INTRAVENOUS | Status: AC
  Administered 2017-01-14: 10 mL via INTRAVENOUS
  Filled 2017-01-14: qty 10

## 2017-01-14 MED ORDER — LEUCOVORIN CALCIUM INJECTION 350 MG
400.0000 mg/m2 | Freq: Once | INTRAVENOUS | Status: AC
  Administered 2017-01-14: 964 mg via INTRAVENOUS
  Filled 2017-01-14: qty 48.2

## 2017-01-14 MED ORDER — PALONOSETRON HCL INJECTION 0.25 MG/5ML
INTRAVENOUS | Status: AC
  Filled 2017-01-14: qty 5

## 2017-01-14 MED ORDER — DEXAMETHASONE SODIUM PHOSPHATE 10 MG/ML IJ SOLN
10.0000 mg | Freq: Once | INTRAMUSCULAR | Status: AC
  Administered 2017-01-14: 10 mg via INTRAVENOUS

## 2017-01-14 MED ORDER — DEXAMETHASONE SODIUM PHOSPHATE 10 MG/ML IJ SOLN
INTRAMUSCULAR | Status: AC
  Filled 2017-01-14: qty 1

## 2017-01-14 MED ORDER — FLUOROURACIL CHEMO INJECTION 2.5 GM/50ML
400.0000 mg/m2 | Freq: Once | INTRAVENOUS | Status: AC
  Administered 2017-01-14: 950 mg via INTRAVENOUS
  Filled 2017-01-14: qty 19

## 2017-01-14 MED ORDER — SODIUM CHLORIDE 0.9 % IV SOLN
INTRAVENOUS | Status: DC
  Administered 2017-01-14: 12:00:00 via INTRAVENOUS

## 2017-01-14 MED ORDER — PALONOSETRON HCL INJECTION 0.25 MG/5ML
0.2500 mg | Freq: Once | INTRAVENOUS | Status: AC
  Administered 2017-01-14: 0.25 mg via INTRAVENOUS

## 2017-01-14 MED ORDER — SODIUM CHLORIDE 0.9 % IV SOLN
2400.0000 mg/m2 | INTRAVENOUS | Status: DC
  Administered 2017-01-14: 5800 mg via INTRAVENOUS
  Filled 2017-01-14: qty 116

## 2017-01-14 NOTE — Patient Instructions (Signed)
Thank you for choosing [facility] to provide your oncology and hematology care.  To afford each patient quality time with our providers, please arrive 30 minutes before your scheduled appointment time.  If you arrive late for your appointment, you may be asked to reschedule.  We strive to give you quality time with our providers, and arriving late affects you and other patients whose appointments are after yours.  If you are a no show for multiple scheduled visits, you may be dismissed from the clinic at the providers discretion.   Again, thank you for choosing [facility], our hope is that these requests will decrease the amount of time that you wait before being seen by our physicians.  ______________________________________________________________________ Should you have questions after your visit to the [facility], please contact our office at [phone] between the hours of 8:30 and 4:30 p.m.    Voicemails left after 4:30p.m will not be returned until the following business day.   For prescription refill requests, please have your pharmacy contact us directly.  Please also try to allow 48 hours for prescription requests.   Please contact the scheduling department for questions regarding scheduling.  For scheduling of procedures such as PET scans, CT scans, MRI, Ultrasound, etc please contact central scheduling at [phone].   Resources For Cancer Patients and Caregivers:  American Cancer Society:  800-227-2345  Can help patients locate various types of support and financial assistance Cancer Care: 1-800-813-HOPE (4673) Provides financial assistance, online support groups, medication/co-pay assistance.   [place] DSS:  336-641-3447 Where to apply for food stamps, Medicaid, and utility assistance Medicare Rights Center: 800-333-4114 Helps people with Medicare understand their rights and benefits, navigate the Medicare system, and secure the  quality healthcare they deserve SCAT: 336-333-6589 [employer]'s shared-ride transportation service for eligible riders who have a disability that prevents them from riding the fixed route bus.   For additional information on assistance programs please contact our social worker:   Grier Hock/Abigail Elmore:  [phone] 

## 2017-01-14 NOTE — Telephone Encounter (Signed)
Lab and flush appointment added to Chemo appointment date on 01/28/17, per patient's wife indicated that lab and flush was not scheduled. Updated appointment schedule and AVS report was given to patient.

## 2017-01-14 NOTE — Patient Instructions (Signed)
[facility] Discharge Instructions for Patients Receiving Chemotherapy  Today you received the following chemotherapy agents leucovorin/florouracil   To help prevent nausea and vomiting after your treatment, we encourage you to take your nausea medication as directed   If you develop nausea and vomiting that is not controlled by your nausea medication, call the clinic.   BELOW ARE SYMPTOMS THAT SHOULD BE REPORTED IMMEDIATELY:  *FEVER GREATER THAN 100.5 F  *CHILLS WITH OR WITHOUT FEVER  NAUSEA AND VOMITING THAT IS NOT CONTROLLED WITH YOUR NAUSEA MEDICATION  *UNUSUAL SHORTNESS OF BREATH  *UNUSUAL BRUISING OR BLEEDING  TENDERNESS IN MOUTH AND THROAT WITH OR WITHOUT PRESENCE OF ULCERS  *URINARY PROBLEMS  *BOWEL PROBLEMS  UNUSUAL RASH Items with * indicate a potential emergency and should be followed up as soon as possible.  Feel free to call the clinic you have any questions or concerns. The clinic phone number is [phone].  

## 2017-01-14 NOTE — Telephone Encounter (Signed)
Appointments scheduled per 01/14/17 los. Patient was given a copy of the AVS report and appointment schedule, per 01/14/17 los.

## 2017-01-14 NOTE — Progress Notes (Signed)
Ok to treat with CBC/CMET. No Avastin/Oxaliplatin per MD Irene Limbo.

## 2017-01-16 ENCOUNTER — Ambulatory Visit: Payer: 59

## 2017-01-16 VITALS — BP 124/79 | HR 80 | Temp 97.9°F | Resp 18

## 2017-01-16 DIAGNOSIS — C189 Malignant neoplasm of colon, unspecified: Secondary | ICD-10-CM

## 2017-01-16 DIAGNOSIS — C787 Secondary malignant neoplasm of liver and intrahepatic bile duct: Principal | ICD-10-CM

## 2017-01-16 MED ORDER — HEPARIN SOD (PORK) LOCK FLUSH 100 UNIT/ML IV SOLN
500.0000 [IU] | Freq: Once | INTRAVENOUS | Status: AC | PRN
  Administered 2017-01-16: 500 [IU]
  Filled 2017-01-16: qty 5

## 2017-01-16 MED ORDER — SODIUM CHLORIDE 0.9% FLUSH
10.0000 mL | INTRAVENOUS | Status: DC | PRN
  Administered 2017-01-16: 10 mL
  Filled 2017-01-16: qty 10

## 2017-01-16 NOTE — Patient Instructions (Signed)
Implanted Port Home Guide An implanted port is a type of central line that is placed under the skin. Central lines are used to provide IV access when treatment or nutrition needs to be given through a person's veins. Implanted ports are used for long-term IV access. An implanted port may be placed because:  You need IV medicine that would be irritating to the small veins in your hands or arms.  You need long-term IV medicines, such as antibiotics.  You need IV nutrition for a long period.  You need frequent blood draws for lab tests.  You need dialysis.  Implanted ports are usually placed in the chest area, but they can also be placed in the upper arm, the abdomen, or the leg. An implanted port has two main parts:  Reservoir. The reservoir is round and will appear as a small, raised area under your skin. The reservoir is the part where a needle is inserted to give medicines or draw blood.  Catheter. The catheter is a thin, flexible tube that extends from the reservoir. The catheter is placed into a large vein. Medicine that is inserted into the reservoir goes into the catheter and then into the vein.  How will I care for my incision site? Do not get the incision site wet. Bathe or shower as directed by your health care provider. How is my port accessed? Special steps must be taken to access the port:  Before the port is accessed, a numbing cream can be placed on the skin. This helps numb the skin over the port site.  Your health care provider uses a sterile technique to access the port. ? Your health care provider must put on a mask and sterile gloves. ? The skin over your port is cleaned carefully with an antiseptic and allowed to dry. ? The port is gently pinched between sterile gloves, and a needle is inserted into the port.  Only "non-coring" port needles should be used to access the port. Once the port is accessed, a blood return should be checked. This helps ensure that the port  is in the vein and is not clogged.  If your port needs to remain accessed for a constant infusion, a clear (transparent) bandage will be placed over the needle site. The bandage and needle will need to be changed every week, or as directed by your health care provider.  Keep the bandage covering the needle clean and dry. Do not get it wet. Follow your health care provider's instructions on how to take a shower or bath while the port is accessed.  If your port does not need to stay accessed, no bandage is needed over the port.  What is flushing? Flushing helps keep the port from getting clogged. Follow your health care provider's instructions on how and when to flush the port. Ports are usually flushed with saline solution or a medicine called heparin. The need for flushing will depend on how the port is used.  If the port is used for intermittent medicines or blood draws, the port will need to be flushed: ? After medicines have been given. ? After blood has been drawn. ? As part of routine maintenance.  If a constant infusion is running, the port may not need to be flushed.  How long will my port stay implanted? The port can stay in for as long as your health care provider thinks it is needed. When it is time for the port to come out, surgery will be   done to remove it. The procedure is similar to the one performed when the port was put in. When should I seek immediate medical care? When you have an implanted port, you should seek immediate medical care if:  You notice a bad smell coming from the incision site.  You have swelling, redness, or drainage at the incision site.  You have more swelling or pain at the port site or the surrounding area.  You have a fever that is not controlled with medicine.  This information is not intended to replace advice given to you by your health care provider. Make sure you discuss any questions you have with your health care provider. Document  Released: 08/03/2005 Document Revised: 01/09/2016 Document Reviewed: 04/10/2013 Elsevier Interactive Patient Education  2017 [employer].  

## 2017-01-27 ENCOUNTER — Telehealth: Payer: Self-pay | Admitting: Hematology

## 2017-01-27 NOTE — Telephone Encounter (Signed)
Per 6/12 schedule message moved 6/14 appointments to February 11, 2023 due to death in family. Remaining appointments adjusted. Dr. Irene Limbo not in office week of 7/5. Added f/u for 7/19 - ok per desk nurse [facility]).   Spoke with patient he is aware and will get new schedule at 2023-02-11 visit.

## 2017-01-28 ENCOUNTER — Ambulatory Visit: Payer: 59

## 2017-01-28 ENCOUNTER — Other Ambulatory Visit: Payer: 59

## 2017-02-03 ENCOUNTER — Other Ambulatory Visit: Payer: Self-pay | Admitting: *Deleted

## 2017-02-03 DIAGNOSIS — C189 Malignant neoplasm of colon, unspecified: Secondary | ICD-10-CM

## 2017-02-03 DIAGNOSIS — C787 Secondary malignant neoplasm of liver and intrahepatic bile duct: Principal | ICD-10-CM

## 2017-02-04 ENCOUNTER — Ambulatory Visit (HOSPITAL_BASED_OUTPATIENT_CLINIC_OR_DEPARTMENT_OTHER): Payer: 59

## 2017-02-04 ENCOUNTER — Other Ambulatory Visit (HOSPITAL_BASED_OUTPATIENT_CLINIC_OR_DEPARTMENT_OTHER): Payer: 59

## 2017-02-04 VITALS — BP 125/82 | HR 81 | Temp 98.1°F | Resp 18

## 2017-02-04 DIAGNOSIS — Z5111 Encounter for antineoplastic chemotherapy: Secondary | ICD-10-CM | POA: Diagnosis not present

## 2017-02-04 DIAGNOSIS — D500 Iron deficiency anemia secondary to blood loss (chronic): Secondary | ICD-10-CM

## 2017-02-04 DIAGNOSIS — C182 Malignant neoplasm of ascending colon: Secondary | ICD-10-CM | POA: Diagnosis not present

## 2017-02-04 DIAGNOSIS — C189 Malignant neoplasm of colon, unspecified: Secondary | ICD-10-CM | POA: Diagnosis not present

## 2017-02-04 DIAGNOSIS — C787 Secondary malignant neoplasm of liver and intrahepatic bile duct: Secondary | ICD-10-CM

## 2017-02-04 DIAGNOSIS — Z95828 Presence of other vascular implants and grafts: Secondary | ICD-10-CM

## 2017-02-04 LAB — COMPREHENSIVE METABOLIC PANEL
ALT: 49 U/L (ref 0–55)
AST: 45 U/L — AB (ref 5–34)
Albumin: 3.7 g/dL (ref 3.5–5.0)
Alkaline Phosphatase: 112 U/L (ref 40–150)
Anion Gap: 8 mEq/L (ref 3–11)
BUN: 7.1 mg/dL (ref 7.0–26.0)
CHLORIDE: 107 meq/L (ref 98–109)
CO2: 24 mEq/L (ref 22–29)
Calcium: 9.6 mg/dL (ref 8.4–10.4)
Creatinine: 0.9 mg/dL (ref 0.7–1.3)
GLUCOSE: 155 mg/dL — AB (ref 70–140)
POTASSIUM: 4.1 meq/L (ref 3.5–5.1)
SODIUM: 139 meq/L (ref 136–145)
Total Bilirubin: 0.87 mg/dL (ref 0.20–1.20)
Total Protein: 6.8 g/dL (ref 6.4–8.3)

## 2017-02-04 LAB — CBC WITH DIFFERENTIAL/PLATELET
BASO%: 0.7 % (ref 0.0–2.0)
BASOS ABS: 0 10*3/uL (ref 0.0–0.1)
EOS%: 5.6 % (ref 0.0–7.0)
Eosinophils Absolute: 0.2 10*3/uL (ref 0.0–0.5)
HCT: 44.2 % (ref 38.4–49.9)
HGB: 14.7 g/dL (ref 13.0–17.1)
LYMPH%: 33.1 % (ref 14.0–49.0)
MCH: 31.4 pg (ref 27.2–33.4)
MCHC: 33.3 g/dL (ref 32.0–36.0)
MCV: 94.5 fL (ref 79.3–98.0)
MONO#: 0.5 10*3/uL (ref 0.1–0.9)
MONO%: 15.5 % — AB (ref 0.0–14.0)
NEUT#: 1.3 10*3/uL — ABNORMAL LOW (ref 1.5–6.5)
NEUT%: 45.1 % (ref 39.0–75.0)
Platelets: 96 10*3/uL — ABNORMAL LOW (ref 140–400)
RBC: 4.68 10*6/uL (ref 4.20–5.82)
RDW: 15.4 % — ABNORMAL HIGH (ref 11.0–14.6)
WBC: 3 10*3/uL — ABNORMAL LOW (ref 4.0–10.3)
lymph#: 1 10*3/uL (ref 0.9–3.3)

## 2017-02-04 MED ORDER — PALONOSETRON HCL INJECTION 0.25 MG/5ML
0.2500 mg | Freq: Once | INTRAVENOUS | Status: AC
  Administered 2017-02-04: 0.25 mg via INTRAVENOUS

## 2017-02-04 MED ORDER — PALONOSETRON HCL INJECTION 0.25 MG/5ML
INTRAVENOUS | Status: AC
  Filled 2017-02-04: qty 5

## 2017-02-04 MED ORDER — DEXAMETHASONE SODIUM PHOSPHATE 10 MG/ML IJ SOLN
INTRAMUSCULAR | Status: AC
  Filled 2017-02-04: qty 1

## 2017-02-04 MED ORDER — SODIUM CHLORIDE 0.9 % IV SOLN
2400.0000 mg/m2 | INTRAVENOUS | Status: DC
  Administered 2017-02-04: 5800 mg via INTRAVENOUS
  Filled 2017-02-04: qty 116

## 2017-02-04 MED ORDER — LEUCOVORIN CALCIUM INJECTION 350 MG
400.0000 mg/m2 | Freq: Once | INTRAVENOUS | Status: AC
  Administered 2017-02-04: 964 mg via INTRAVENOUS
  Filled 2017-02-04: qty 48.2

## 2017-02-04 MED ORDER — FLUOROURACIL CHEMO INJECTION 2.5 GM/50ML
400.0000 mg/m2 | Freq: Once | INTRAVENOUS | Status: AC
  Administered 2017-02-04: 950 mg via INTRAVENOUS
  Filled 2017-02-04: qty 19

## 2017-02-04 MED ORDER — DEXAMETHASONE SODIUM PHOSPHATE 10 MG/ML IJ SOLN
10.0000 mg | Freq: Once | INTRAMUSCULAR | Status: AC
  Administered 2017-02-04: 10 mg via INTRAVENOUS

## 2017-02-04 MED ORDER — SODIUM CHLORIDE 0.9% FLUSH
10.0000 mL | INTRAVENOUS | Status: DC | PRN
  Administered 2017-02-04: 10 mL
  Filled 2017-02-04: qty 10

## 2017-02-04 MED ORDER — SODIUM CHLORIDE 0.9 % IV SOLN
INTRAVENOUS | Status: DC
  Administered 2017-02-04: 08:00:00 via INTRAVENOUS

## 2017-02-04 MED ORDER — SODIUM CHLORIDE 0.9% FLUSH
10.0000 mL | INTRAVENOUS | Status: DC | PRN
  Administered 2017-02-04: 10 mL via INTRAVENOUS
  Filled 2017-02-04: qty 10

## 2017-02-04 NOTE — Patient Instructions (Signed)
[facility] Discharge Instructions for Patients Receiving Chemotherapy  Today you received the following chemotherapy agents leucovorin/florouracil   To help prevent nausea and vomiting after your treatment, we encourage you to take your nausea medication as directed   If you develop nausea and vomiting that is not controlled by your nausea medication, call the clinic.   BELOW ARE SYMPTOMS THAT SHOULD BE REPORTED IMMEDIATELY:  *FEVER GREATER THAN 100.5 F  *CHILLS WITH OR WITHOUT FEVER  NAUSEA AND VOMITING THAT IS NOT CONTROLLED WITH YOUR NAUSEA MEDICATION  *UNUSUAL SHORTNESS OF BREATH  *UNUSUAL BRUISING OR BLEEDING  TENDERNESS IN MOUTH AND THROAT WITH OR WITHOUT PRESENCE OF ULCERS  *URINARY PROBLEMS  *BOWEL PROBLEMS  UNUSUAL RASH Items with * indicate a potential emergency and should be followed up as soon as possible.  Feel free to call the clinic you have any questions or concerns. The clinic phone number is [phone].  

## 2017-02-04 NOTE — Progress Notes (Signed)
Dr. Irene Limbo okay to tx today with ANC 1.3 and plt 96. Neutropenic precautions explained to pt and wife. Both verbalized understanding. Masks given for pt use.

## 2017-02-06 ENCOUNTER — Ambulatory Visit (HOSPITAL_BASED_OUTPATIENT_CLINIC_OR_DEPARTMENT_OTHER): Payer: Self-pay

## 2017-02-06 VITALS — BP 126/72 | HR 71 | Temp 98.3°F | Resp 18

## 2017-02-06 DIAGNOSIS — C189 Malignant neoplasm of colon, unspecified: Secondary | ICD-10-CM

## 2017-02-06 DIAGNOSIS — C787 Secondary malignant neoplasm of liver and intrahepatic bile duct: Secondary | ICD-10-CM

## 2017-02-06 MED ORDER — SODIUM CHLORIDE 0.9% FLUSH
10.0000 mL | INTRAVENOUS | Status: DC | PRN
  Administered 2017-02-06: 10 mL
  Filled 2017-02-06: qty 10

## 2017-02-06 MED ORDER — HEPARIN SOD (PORK) LOCK FLUSH 100 UNIT/ML IV SOLN
500.0000 [IU] | Freq: Once | INTRAVENOUS | Status: AC | PRN
  Administered 2017-02-06: 500 [IU]
  Filled 2017-02-06: qty 5

## 2017-02-06 NOTE — Patient Instructions (Signed)

## 2017-02-11 ENCOUNTER — Ambulatory Visit: Payer: 59 | Admitting: Hematology

## 2017-02-11 ENCOUNTER — Other Ambulatory Visit: Payer: 59

## 2017-02-11 ENCOUNTER — Ambulatory Visit: Payer: 59

## 2017-02-12 DIAGNOSIS — C189 Malignant neoplasm of colon, unspecified: Secondary | ICD-10-CM | POA: Diagnosis not present

## 2017-02-16 DIAGNOSIS — C787 Secondary malignant neoplasm of liver and intrahepatic bile duct: Secondary | ICD-10-CM | POA: Diagnosis not present

## 2017-02-16 DIAGNOSIS — C189 Malignant neoplasm of colon, unspecified: Secondary | ICD-10-CM | POA: Diagnosis not present

## 2017-02-18 ENCOUNTER — Ambulatory Visit: Payer: 59

## 2017-02-18 ENCOUNTER — Other Ambulatory Visit (HOSPITAL_BASED_OUTPATIENT_CLINIC_OR_DEPARTMENT_OTHER): Payer: 59

## 2017-02-18 ENCOUNTER — Ambulatory Visit (HOSPITAL_BASED_OUTPATIENT_CLINIC_OR_DEPARTMENT_OTHER): Payer: 59

## 2017-02-18 ENCOUNTER — Other Ambulatory Visit: Payer: Self-pay | Admitting: Hematology

## 2017-02-18 VITALS — BP 129/81 | HR 84 | Temp 98.6°F | Resp 18

## 2017-02-18 DIAGNOSIS — Z95828 Presence of other vascular implants and grafts: Secondary | ICD-10-CM

## 2017-02-18 DIAGNOSIS — C189 Malignant neoplasm of colon, unspecified: Secondary | ICD-10-CM

## 2017-02-18 DIAGNOSIS — C182 Malignant neoplasm of ascending colon: Secondary | ICD-10-CM

## 2017-02-18 DIAGNOSIS — C787 Secondary malignant neoplasm of liver and intrahepatic bile duct: Principal | ICD-10-CM

## 2017-02-18 DIAGNOSIS — Z5111 Encounter for antineoplastic chemotherapy: Secondary | ICD-10-CM | POA: Diagnosis not present

## 2017-02-18 LAB — COMPREHENSIVE METABOLIC PANEL
ALT: 48 U/L (ref 0–55)
ANION GAP: 7 meq/L (ref 3–11)
AST: 39 U/L — ABNORMAL HIGH (ref 5–34)
Albumin: 3.8 g/dL (ref 3.5–5.0)
Alkaline Phosphatase: 94 U/L (ref 40–150)
BUN: 9 mg/dL (ref 7.0–26.0)
CHLORIDE: 107 meq/L (ref 98–109)
CO2: 27 meq/L (ref 22–29)
Calcium: 9.9 mg/dL (ref 8.4–10.4)
Creatinine: 0.8 mg/dL (ref 0.7–1.3)
Glucose: 99 mg/dl (ref 70–140)
Potassium: 4.1 mEq/L (ref 3.5–5.1)
Sodium: 141 mEq/L (ref 136–145)
Total Bilirubin: 1.09 mg/dL (ref 0.20–1.20)
Total Protein: 6.9 g/dL (ref 6.4–8.3)

## 2017-02-18 LAB — CBC WITH DIFFERENTIAL/PLATELET
BASO%: 0.6 % (ref 0.0–2.0)
Basophils Absolute: 0 10*3/uL (ref 0.0–0.1)
EOS%: 2.8 % (ref 0.0–7.0)
Eosinophils Absolute: 0.1 10*3/uL (ref 0.0–0.5)
HCT: 44.7 % (ref 38.4–49.9)
HEMOGLOBIN: 14.6 g/dL (ref 13.0–17.1)
LYMPH%: 24.5 % (ref 14.0–49.0)
MCH: 31.4 pg (ref 27.2–33.4)
MCHC: 32.7 g/dL (ref 32.0–36.0)
MCV: 96.1 fL (ref 79.3–98.0)
MONO#: 0.6 10*3/uL (ref 0.1–0.9)
MONO%: 12.8 % (ref 0.0–14.0)
NEUT#: 2.9 10*3/uL (ref 1.5–6.5)
NEUT%: 59.3 % (ref 39.0–75.0)
PLATELETS: 77 10*3/uL — AB (ref 140–400)
RBC: 4.65 10*6/uL (ref 4.20–5.82)
RDW: 14.7 % — ABNORMAL HIGH (ref 11.0–14.6)
WBC: 4.9 10*3/uL (ref 4.0–10.3)
lymph#: 1.2 10*3/uL (ref 0.9–3.3)

## 2017-02-18 MED ORDER — PALONOSETRON HCL INJECTION 0.25 MG/5ML
INTRAVENOUS | Status: AC
  Filled 2017-02-18: qty 5

## 2017-02-18 MED ORDER — DEXAMETHASONE SODIUM PHOSPHATE 10 MG/ML IJ SOLN
INTRAMUSCULAR | Status: AC
  Filled 2017-02-18: qty 1

## 2017-02-18 MED ORDER — SODIUM CHLORIDE 0.9 % IV SOLN
2400.0000 mg/m2 | INTRAVENOUS | Status: DC
  Administered 2017-02-18: 5800 mg via INTRAVENOUS
  Filled 2017-02-18: qty 116

## 2017-02-18 MED ORDER — DEXAMETHASONE SODIUM PHOSPHATE 10 MG/ML IJ SOLN
10.0000 mg | Freq: Once | INTRAMUSCULAR | Status: AC
  Administered 2017-02-18: 10 mg via INTRAVENOUS

## 2017-02-18 MED ORDER — PALONOSETRON HCL INJECTION 0.25 MG/5ML
0.2500 mg | Freq: Once | INTRAVENOUS | Status: AC
  Administered 2017-02-18: 0.25 mg via INTRAVENOUS

## 2017-02-18 MED ORDER — SODIUM CHLORIDE 0.9% FLUSH
10.0000 mL | INTRAVENOUS | Status: DC | PRN
  Administered 2017-02-18: 10 mL via INTRAVENOUS
  Filled 2017-02-18: qty 10

## 2017-02-18 MED ORDER — LEUCOVORIN CALCIUM INJECTION 350 MG
400.0000 mg/m2 | Freq: Once | INTRAVENOUS | Status: AC
  Administered 2017-02-18: 964 mg via INTRAVENOUS
  Filled 2017-02-18: qty 48.2

## 2017-02-18 NOTE — Patient Instructions (Signed)

## 2017-02-18 NOTE — Patient Instructions (Signed)
[facility] Discharge Instructions for Patients Receiving Chemotherapy  Today you received the following chemotherapy agents: Leucovorin and 5FU.  To help prevent nausea and vomiting after your treatment, we encourage you to take your nausea medication: as directed.   If you develop nausea and vomiting that is not controlled by your nausea medication, call the clinic.   BELOW ARE SYMPTOMS THAT SHOULD BE REPORTED IMMEDIATELY:  *FEVER GREATER THAN 100.5 F  *CHILLS WITH OR WITHOUT FEVER  NAUSEA AND VOMITING THAT IS NOT CONTROLLED WITH YOUR NAUSEA MEDICATION  *UNUSUAL SHORTNESS OF BREATH  *UNUSUAL BRUISING OR BLEEDING  TENDERNESS IN MOUTH AND THROAT WITH OR WITHOUT PRESENCE OF ULCERS  *URINARY PROBLEMS  *BOWEL PROBLEMS  UNUSUAL RASH Items with * indicate a potential emergency and should be followed up as soon as possible.  Feel free to call the clinic you have any questions or concerns. The clinic phone number is [phone].  Please show the CHEMO ALERT CARD at check-in to the Emergency Department and triage nurse.   

## 2017-02-19 NOTE — Progress Notes (Signed)
Scott Gallagher Kitchen    HEMATOLOGY/ONCOLOGY CLINIC NOTE  Date of Service: .01/14/2017  Patient Care Team: [name], MD as PCP - General (Family Medicine) [name] M.D. 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 KRAS mutated BRAF, NRAS mutation neg MSI Stable.  PET/CT scan done on 06/25/2016 Show multiple foci of hypermetabolic hepatic metastases (atleast 4-5) and 2 hypermetabolic peritoneal nodules along the dorsal peritoneal surface concerning for peritoneal metastases.  MRI Liver 07/27/2016 - Multiple small liver metastases throughout the right and left hepatic lobes, largest measuring 2.1 cm. 1.2 cm enhancing peritoneal nodule in left paracolic gutter, suspicious for peritoneal metastasis. Other small peritoneal nodules better visualized on recent PET-CT which showed hypermetabolic activity, also suspicious for peritoneal metastases.   CT CHEST WO CONTRAST2/26/2018 [facility] Result Impression   1. Congenital variant of bilateral middle lobes. 2. Groundglass opacification with regions of bronchiectasis in the right lower lobe adjacent to the fissure, consistent with inflammatory/infectious changes. 3. No definitive evidence of metastatic disease to the chest. 4. Probable sebaceous cyst in the subcutaneous tissues beneath the left anterior chest.   MR ABDOMEN AND PELVIS IHWTUUEK FOLLOW UP2/26/2018 [facility] Result Impression    Decreased size of multiple hepatic lesions and two peritoneal nodules compared to outside MRI 07/27/2016. No new lesions identified in the abdomen or pelvis.      TREATMENT  FOLFOX x 10 cycles. Avastin added from Cycle 5 Cycle 11 and 12 with 5FU/leucovorin + Avastin  Currently on maintenance 5FU/leucovorin. Avastin held from 12/30/2016 due to neuropathy  INTERVAL HISTORY  Patient is here for follow-up for his metastatic colon cancer. His recent MRI Liver and CT chest[facility]abd/pelvis at [facility] shows stable/improved liver mets. He is currently continuing on maintenance 5FU/leucovorin and is being evaluated for liver and peritoneal disease directed therapies at [facility]. No fevers or chills. No night sweats. No new abdominal symptoms. Bowel movements have been regular.  MEDICAL HISTORY:  Past Medical History:  Diagnosis Date  . Anemia   . Arthritis   . Cancer ([place])   . Diabetes mellitus without complication (HCC)    diet controlled  . History of blood transfusion   . Hyperlipidemia   . Sleep apnea    cpap  . Wears glasses     SURGICAL HISTORY: Past Surgical History:  Procedure Laterality Date  . COLON SURGERY    . IR GENERIC HISTORICAL  09/24/2016   IR CV LINE INJECTION 09/24/2016 [facility]  . IR GENERIC HISTORICAL  09/24/2016   IR US GUIDE VASC ACCESS RIGHT 09/24/2016 [facility]  . IR GENERIC HISTORICAL  09/24/2016   IR FLUORO GUIDE CV LINE RIGHT 09/24/2016 [facility]  . IR GENERIC HISTORICAL  09/30/2016   IR FLUORO GUIDE PORT INSERTION RIGHT 09/30/2016 Markus Daft, MD [facility]  . IR GENERIC HISTORICAL  09/30/2016   IR US GUIDE VASC ACCESS RIGHT 09/30/2016 Markus Daft, MD [facility]  . IR GENERIC HISTORICAL  09/30/2016   IR REMOVAL TUN ACCESS W/ PORT W/O FL MOD SED 09/30/2016 Markus Daft, MD [facility]  . LAPAROSCOPIC RIGHT HEMI COLECTOMY Right 06/17/2016   Procedure: LAPAROSCOPIC ASSISTED  RIGHT HEMI COLECTOMY;  Surgeon: [name], MD;  Location: [facility];  Service: General;  Laterality: Right;  . PORTACATH PLACEMENT Left 07/13/2016   Procedure: INSERTION PORT-A-CATH left subclavian;  Surgeon: [name], MD;  Location: [facility];   Service: General;  Laterality: Left;  . SHOULDER ACROMIOPLASTY Right 12/20/2014   Procedure: SHOULDER ACROMIOPLASTY;  Surgeon: Melrose Nakayama, MD;  Location: [place];  Service: Orthopedics;  Laterality: Right;  . SHOULDER ARTHROSCOPY Right 12/20/2014   Procedure: RIGHT ARTHROSCOPY SHOULDER WITH DEBRIDEMENT;  Surgeon: Melrose Nakayama, MD;  Location: [place];  Service: Orthopedics;  Laterality: Right;  . TESTICLE SURGERY     as teen  . TONSILLECTOMY    . WISDOM TOOTH EXTRACTION      SOCIAL HISTORY: Social History   Social History  . Marital status: Married    Spouse name: N/A  . Number of children: N/A  . Years of education: N/A   Occupational History  . cemetery maintenance    Social History Main Topics  . Smoking status: Former Smoker    Packs/day: 1.50    Years: 29.00    Types: Cigarettes    Quit date: 12/16/2012  . Smokeless tobacco: Never Used     Comment: 1-2 ppd from age [age]y to 2014  . Alcohol use 1.8 oz/week    3 Shots of liquor per week     Comment:  some days  . Drug use: No  . Sexual activity: Not on file   Other Topics Concern  . Not on file   Social History Narrative  . No narrative on file    FAMILY HISTORY: Family History  Problem Relation Age of Onset  . Diabetes Other   . Hyperlipidemia Other   . Hypertension Other   . Breast cancer Maternal Aunt 70  . Prostate cancer Maternal Uncle 75  . Lung cancer Paternal Uncle 61  . Stroke Maternal Grandfather 72  . Cancer Paternal Grandmother 71       dx cancer of pancreas and colon, unknown if separate primaries  . Heart Problems Paternal Grandfather        d. 53  . Prostate cancer Maternal Uncle 18  . Breast cancer Maternal Aunt 75  . Breast cancer Maternal Aunt 68  . Cervical cancer Maternal Aunt 68  . Pancreatic cancer Maternal Aunt 54       d. 58y; heavy smoker  . Colon cancer Paternal Uncle 61       s/p partial colectomy; dx. second colon cancer at age 86-64     ALLERGIES:  is allergic to penicillins.  MEDICATIONS:  Current Outpatient Prescriptions  Medication Sig Dispense Refill  . dexamethasone (DECADRON) 4 MG tablet Take 2 tablets (8 mg total) by mouth daily. Start the day after chemotherapy for 2 days. Take with food. 30 tablet 1  . DULoxetine (CYMBALTA) 60 MG capsule Take 1 capsule (60 mg total) by mouth daily. 30 capsule 2  . lidocaine-prilocaine (EMLA) cream Apply to affected area once 30 g 3  . LORazepam (ATIVAN) 0.5 MG tablet Take 1 tablet (0.5 mg total) by mouth every 8 (eight) hours. For anxiety or sleep 30 tablet 0  . Multiple Vitamin (MULTIVITAMIN) tablet Take 1 tablet by mouth daily.    . ondansetron (ZOFRAN) 8 MG tablet Take 1 tablet (8 mg total) by mouth 2 (two) times daily as needed for refractory nausea / vomiting. Start on day 3 after chemotherapy. 30 tablet 1  . Probiotic Product (PROBIOTIC PO) Take 1 tablet by mouth daily.    Scott Gallagher Kitchen  prochlorperazine (COMPAZINE) 10 MG tablet Take 1 tablet (10 mg total) by mouth every 6 (six) hours as needed (Nausea or vomiting). 30 tablet 1   No current facility-administered medications for this visit.     REVIEW OF SYSTEMS:    10 Point review of Systems was done is negative except as noted above.  PHYSICAL EXAMINATION: ECOG PERFORMANCE STATUS: 1 - Symptomatic but completely ambulatory VS GENERAL:alert, in no acute distress and comfortable SKIN: skin color, texture, turgor are normal, no rashes or significant lesions EYES: normal, conjunctiva are pink and non-injected, sclera clear OROPHARYNX:no exudate, no erythema and lips, buccal mucosa, and tongue normal  NECK: supple, no JVD, thyroid normal size, non-tender, without nodularity LYMPH:  no palpable lymphadenopathy in the cervical, axillary or inguinal LUNGS: clear to auscultation with normal respiratory effort HEART: regular rate & rhythm,  no murmurs and no lower extremity edema ABDOMEN: abdomen soft, bowel sounds normoactive, healing  laparoscopic surgical incisions with no discharge look clean. Musculoskeletal: no cyanosis of digits and no clubbing  PSYCH: alert & oriented x 3 with fluent speech NEURO: no focal motor/sensory deficits   LABORATORY DATA:  I have reviewed the data as listed  . CBC Latest Ref Rng & Units 01/14/2017  WBC 4.0 - 10.3 10e3/uL 3.9(L)  Hemoglobin 13.0 - 17.1 g/dL 14.7  Hematocrit 38.4 - 49.9 % 45.0  Platelets 140 - 400 10e3/uL 96(L)    . CMP Latest Ref Rng & Units 01/14/2017  Glucose 70 - 140 mg/dl 162(H)  BUN 7.0 - 26.0 mg/dL 9.4  Creatinine 0.7 - 1.3 mg/dL 0.8  Sodium 136 - 145 mEq/L 140  Potassium 3.5 - 5.1 mEq/L 3.9  Chloride 101 - 111 mmol/L -  CO2 22 - 29 mEq/L 25  Calcium 8.4 - 10.4 mg/dL 9.4  Total Protein 6.4 - 8.3 g/dL 6.9  Total Bilirubin 0.20 - 1.20 mg/dL 1.07  Alkaline Phos 40 - 150 U/L 102  AST 5 - 34 U/L 33  ALT 0 - 55 U/L 28       Microscopic Comment 2. COLON AND RECTUM (INCLUDING TRANS-ANAL RESECTION): Specimen: Terminal ileum, right colon and appendix Procedure: Segmental resection Tumor site: Proximal ascending colon Specimen integrity: Intact Macroscopic intactness of mesorectum: Not applicable: x Complete: NA Near complete: NA Incomplete: NA Cannot be determined (specify): NA Macroscopic tumor perforation: The mass invades through muscularis propria into pericolonic soft tissue Invasive tumor: Maximum size: 5.5 cm Histologic type(s): Adenocarcinoma Histologic grade and differentiation: G3 G1: well differentiated/low grade 1 of 4 Supplemental copy SUPPLEMENTAL for Kimmons, Alann ([id number]) Microscopic Comment(continued) G2: moderately differentiated/low grade G3: poorly differentiated/high grade G4: undifferentiated/high grade Type of polyp in which invasive carcinoma arose: Tubular adenoma Microscopic extension of invasive tumor: The mass invade through the muscularis propria into pericolonic soft tissue Lymph-Vascular invasion:  Identified Peri-neural invasion: Identified Tumor deposit(s) (discontinuous extramural extension): Present Resection margins: Proximal margin: Negative Distal margin: Negative Circumferential (radial) (posterior ascending, posterior descending; lateral and posterior mid-rectum; and entire lower 1/3 rectum):Negative Mesenteric margin (sigmoid and transverse): NA Distance closest margin (if all above margins negative): 3.7 cm from the non peritonealized pericolic soft tissue margin Trans-anal resection margins only: Deep margin: NA Mucosal Margin: NA Distance closest mucosal margin (if negative): NA Treatment effect (neo-adjuvant therapy): NA Additional polyp(s): Negative Non-neoplastic findings: Unremarkable Lymph nodes: number examined 18; number positive: 5 Pathologic Staging: pT3, N2a, M1a Ancillary studies: MSI ordered      RADIOGRAPHIC STUDIES: I have personally reviewed the radiological images as listed and agreed  with the findings in the report. No results found. 2018 February MR ABDOMEN AND PELVIS WWOTUMOR FOLLOW UP2/26/2018 [facility] Result Impression    Decreased size of multiple hepatic lesions and two peritoneal nodules compared to outside MRI 07/27/2016. No new lesions identified in the abdomen or pelvis.  Result Narrative  MR ABDOMEN AND PELVIS WITH AND WITHOUT CONTRAST (TUMOR FOLLOW-UP PROTOCOL), 10/12/2016 5:59 PM   INDICATION: liver and peritoneal colon cancer metastases \ C18.9 Metastatic colon cancer to liver (HCC) \ C78.7 Metastatic colon cancer to liver ([place]) \ C78.6 Peritoneal carcinomatosis ([place]) \ C80.1 Peritoneal carcinomatosis ([place])   ADDITIONAL HISTORY: History of stage IV colorectal cancer status post laparoscopic right hemicolectomy on 06/17/2016. At time of surgery, tumor nodule in the right lobe of the liver was positive on biopsy for adenocarcinoma. Patient currently undergoing chemotherapy.  COMPARISON: Outside  studies including CT chest abdomen pelvis 05/23/2016, PET CT 07/15/2016, and MR abdomen 07/27/2016   TECHNIQUE: Multiplanar, multisequence MR images of the abdomen and pelvis were obtained before and after intravenous administration of gadolinium-based contrast.   FINDINGS:  LOWER CHEST Heart: Normal size. No pericardial effusion. Lungs/pleura: No masses, consolidations, or effusions.  ABDOMEN Liver: Liver is enlarged, measuring 20 cm in greatest craniocaudal dimension. Hepatic steatosis. Multiple T2 hyperintense and T1 iso to hypointense lesions throughout the liver as marked in series 16, decreased in size compared to outside MR 07/27/2016. Largest lesion is in segment 8 and measures 1.4 cm and demonstrates peripheral enhancement. No new lesion. Gallbladder: Normal. No stones or inflammatory changes. Biliary: No obstruction. Spleen: Spleen is mildly enlarged, measuring 13.8 cm in greatest length. Pancreas: Normal. Adrenals: Normal. Kidneys: Multiple T2 hyperintense nonenhancing lesions in the lower pole of the kidneys bilaterally, consistent with cysts. No hydronephrosis. Stomach/bowel: Right hemicolectomy. Peritoneum: Decreased size of peritoneal nodule in the left paracolic gutter measuring 6 mm (series 10, image 45). Decreased size of peritoneal nodule in the left lower quadrant along the ventral abdominal wall measuring 8 mm (series 23, image 13). No apparent new measurable lesions. Extraperitoneum: Within normal limits. Vascular: Within normal limits.  PELVIS Ureters: Within normal limits. Bladder: Within normal limits. Reproductive system: Within normal limits.  MSK: Lower midline ventral abdominal wall scar.   CT CHEST WO CONTRAST2/26/2018 [facility] Result Impression   1. Congenital variant of bilateral middle lobes. 2. Groundglass opacification with regions of bronchiectasis in the right lower lobe adjacent to the fissure, consistent with  inflammatory/infectious changes. 3. No definitive evidence of metastatic disease to the chest. 4. Probable sebaceous cyst in the subcutaneous tissues beneath the left anterior chest.  Result Narrative  CT CHEST WO CONTRAST, 10/12/2016 3:24 PM  INDICATION:stage IV colon cancer \ C18.9 Metastatic colon cancer to liver ([place]) \ C78.7 Metastatic colon cancer to liver ([place]) \ C78.6 Peritoneal carcinomatosis ([place]) \ C80.1 Peritoneal carcinomatosis ([place])  COMPARISON: None  TECHNIQUE: Multislice axial images were obtained through the chest without administration of iodinated intravenous contrast material. Multi-planar reformatted images were generated for additional analysis. Nongated technique limits cardiac detail.  All CT scans at [facility] and [place] are performed using dose optimization techniques as appropriate to a performed exam, including but not limited to one or more of the following: automated exposure control, adjustment of the mA and/or kV according to patient size, use of iterative reconstruction technique. In addition, [facility] is participating in the [place] program which will further assist [place] in optimizing patient radiation  exposure.   FINDINGS:   Thoracic inlet/central airways: Parenchyma is within normal limits for age. The trachea is patent. Mediastinum/hila/axilla: Scattered, nonpathologically enlarged nodes. Heart/vessels: Normal heart size. Trace pericardial effusion. Coronary artery calcifications.. The tip of the central line is in the distal SVC. Lungs/pleura: There are bilateral middle lobes. Subtle groundglass opacification surrounding the posterior aspect of the right major fissure. This is seen in association with some bronchiectasis. Upper abdomen: See concurrent MRI study. Chest wall/MSK: Multi-segmented sternum. 1.5 cm low-density soft tissue nodule in the subcutaneous tissues beneath the left anterior  chest.     MR LIVER WWO CONTRAST5/18/2018 [facility] Result Impression   1.When compared to the February 2018 MRI, there has been no interval change in the multiple T2 hyperintense lesions scattered throughout the liver compatible with metastases. When compared to the initial MRI performed December 2017, lesions have decreased in size. 2.No evidence of new hepatic or peritoneal lesions.    CT CHEST ABDOMEN PELVIS W CONTRAST (ROUTINE)01/01/2017 [facility] Result Impression   1. Most of the small hepatic lesions noted on prior MRI are not seen on today's CT, likely due to technical differences inherent to the modalities. The largest lesion noted in segment 8 on the prior examination is unchanged in size. 2. Tiny implant along the upper left paracolic gutter, quite similar to the February 2018 MRI. No ascites or new peritoneal lesions are otherwise evident. 3. Status post right hemicolectomy. 4. Hepatic steatosis. 5. Additional ancillary findings as above.     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 KRAS mutated BRAF, NRAS mutation neg MSI Stable.  PET/CT scan done on 06/25/2016 Show multiple foci of hypermetabolic hepatic metastases (atleast 4-5) and 2 hypermetabolic peritoneal nodules along the dorsal peritoneal surface concerning for peritoneal metastases.  MRI Liver 07/27/2016 - Multiple small liver metastases throughout the right and left hepatic lobes, largest measuring 2.1 cm. 1.2 cm enhancing peritoneal nodule in left paracolic gutter, suspicious for peritoneal metastasis. Other small peritoneal nodules better visualized on recent PET-CT which showed hypermetabolic activity, also suspicious for peritoneal metastases.   MRI  Abd 10/12/2016 at [facility] --shows improvement in all the liver and the 2 peritoneal lesions and no new lesions.  CEA level continues to improve.    MRI liver and CT C/A/P on 01/01/2017 as noted above showed improved/stable disease.  PLAN -no overt prohibitive toxicities -grade 102 neuropathy stable but still noticeable -- increased Cymbalta to 58m po daily. -conitnue follow up with Dr. SMoreen Fowlerto optimize control of his diabetes. -continue maintenance 5FU/leucovorin q2weeks. Might need to switch to q3weeks if thrombocytopenia becomes limiting <75k or will cut down 5FU dose. -discussed with Dr SCrisoforo Oxford-- plan to offer patient liver directed Y90 treatment for mets.  -will continue maintenance 5FU/leucovorin for controlling peritoneal disease and may consider rad onc consultation for RT to residual peritoneal disease post liver directed therapies.   #2 Rt upper jaw dental pain --periodonitis resolved, orthopantogram with no abscess. resolved Has a tooth extraction planned on 01/19/2017  #3. 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  Iron def resolved. Lab Results  Component Value Date   IRON 302 (H) 09/23/2016   TIBC 481 (H) 09/23/2016   IRONPCTSAT 63 (H) 09/23/2016   (Iron  and TIBC)  Lab Results  Component Value Date   FERRITIN 108 12/31/2016   Plan -now off PO iron   #5 Cervalgia due to DDD -tramadol prn  #6  Patient Active Problem List   Diagnosis Date Noted  . Chemotherapy-induced neuropathy ([place]) 11/18/2016  . Genetic testing 10/30/2016  . Port catheter in place 09/25/2016  . Family history of colon cancer 07/24/2016  . Family history of prostate cancer 07/24/2016  . Metastatic colon cancer to liver ([place]) 07/01/2016  . Right Colon cancer metastasized to liver ([place]) 06/17/2016  . Chronic GI bleeding 05/29/2016  . Diabetes mellitus type 2, diet-controlled ([place]) 05/22/2016  . OSA (obstructive sleep apnea) 05/22/2016  .  Hyperlipidemia 05/22/2016  . Anemia due to blood loss, chronic   . Antineoplastic chemotherapy induced pancytopenia (CODE) ([place]) 05/21/2016   -Continue follow-up with primary care physician .[name], MD for optimization of diabetes management . Might need basal insulin if his blood sugars are elevated with chemotherapy/ steroids . -Continue use of CPAP for sleep apnea . -off statins   Continue maintenance 5FU/leucovorin q2weeks No Avastin RTC with Dr Irene Limbo in 4 weeks with labs   All of the patients questions were answered with apparent satisfaction. The patient knows to call the clinic with any problems, questions or concerns.  I spent 25 minutes counseling the patient face to face. The total time spent in the appointment was 40 minutes and more than 50% was on counseling and direct patient cares.    Sullivan Lone MD [place] AAHIVMS [facility] Hematology/Oncology Physician [facility]  (Office):       [phone] (Work cell):  [phone] (Fax):           [phone]

## 2017-02-20 ENCOUNTER — Ambulatory Visit (HOSPITAL_BASED_OUTPATIENT_CLINIC_OR_DEPARTMENT_OTHER): Payer: Self-pay

## 2017-02-20 VITALS — BP 115/64 | HR 90 | Temp 98.2°F | Resp 18

## 2017-02-20 DIAGNOSIS — C787 Secondary malignant neoplasm of liver and intrahepatic bile duct: Secondary | ICD-10-CM

## 2017-02-20 DIAGNOSIS — C189 Malignant neoplasm of colon, unspecified: Secondary | ICD-10-CM

## 2017-02-20 MED ORDER — HEPARIN SOD (PORK) LOCK FLUSH 100 UNIT/ML IV SOLN
500.0000 [IU] | Freq: Once | INTRAVENOUS | Status: AC | PRN
  Administered 2017-02-20: 500 [IU]
  Filled 2017-02-20: qty 5

## 2017-02-20 MED ORDER — SODIUM CHLORIDE 0.9% FLUSH
10.0000 mL | INTRAVENOUS | Status: DC | PRN
  Administered 2017-02-20: 10 mL
  Filled 2017-02-20: qty 10

## 2017-02-20 NOTE — Patient Instructions (Signed)

## 2017-02-24 ENCOUNTER — Telehealth: Payer: Self-pay

## 2017-02-24 ENCOUNTER — Other Ambulatory Visit: Payer: Self-pay

## 2017-02-24 DIAGNOSIS — C189 Malignant neoplasm of colon, unspecified: Secondary | ICD-10-CM

## 2017-02-24 DIAGNOSIS — C787 Secondary malignant neoplasm of liver and intrahepatic bile duct: Principal | ICD-10-CM

## 2017-02-24 MED ORDER — DEXAMETHASONE 4 MG PO TABS: 8.0000 mg | ORAL_TABLET | Freq: Every day | ORAL | 0 refills | Status: DC

## 2017-02-24 NOTE — Telephone Encounter (Signed)
Notified pt that decadron was refilled at [facility] pharmacy of choice. Pt verbalized understanding.

## 2017-02-25 ENCOUNTER — Other Ambulatory Visit: Payer: 59

## 2017-02-25 ENCOUNTER — Ambulatory Visit: Payer: 59

## 2017-03-01 DIAGNOSIS — C787 Secondary malignant neoplasm of liver and intrahepatic bile duct: Secondary | ICD-10-CM | POA: Diagnosis not present

## 2017-03-01 DIAGNOSIS — C189 Malignant neoplasm of colon, unspecified: Secondary | ICD-10-CM | POA: Diagnosis not present

## 2017-03-03 ENCOUNTER — Other Ambulatory Visit: Payer: Self-pay | Admitting: *Deleted

## 2017-03-03 DIAGNOSIS — C189 Malignant neoplasm of colon, unspecified: Secondary | ICD-10-CM

## 2017-03-03 DIAGNOSIS — C787 Secondary malignant neoplasm of liver and intrahepatic bile duct: Principal | ICD-10-CM

## 2017-03-04 ENCOUNTER — Other Ambulatory Visit (HOSPITAL_BASED_OUTPATIENT_CLINIC_OR_DEPARTMENT_OTHER): Payer: 59

## 2017-03-04 ENCOUNTER — Encounter: Payer: Self-pay | Admitting: Hematology

## 2017-03-04 ENCOUNTER — Ambulatory Visit: Payer: 59

## 2017-03-04 ENCOUNTER — Ambulatory Visit (HOSPITAL_BASED_OUTPATIENT_CLINIC_OR_DEPARTMENT_OTHER): Payer: 59

## 2017-03-04 ENCOUNTER — Ambulatory Visit (HOSPITAL_BASED_OUTPATIENT_CLINIC_OR_DEPARTMENT_OTHER): Payer: 59 | Admitting: Hematology

## 2017-03-04 VITALS — BP 121/80 | HR 85 | Temp 98.9°F | Resp 20 | Ht 74.0 in | Wt 244.7 lb

## 2017-03-04 DIAGNOSIS — C182 Malignant neoplasm of ascending colon: Secondary | ICD-10-CM

## 2017-03-04 DIAGNOSIS — D696 Thrombocytopenia, unspecified: Secondary | ICD-10-CM

## 2017-03-04 DIAGNOSIS — C189 Malignant neoplasm of colon, unspecified: Secondary | ICD-10-CM

## 2017-03-04 DIAGNOSIS — C787 Secondary malignant neoplasm of liver and intrahepatic bile duct: Secondary | ICD-10-CM

## 2017-03-04 DIAGNOSIS — Z95828 Presence of other vascular implants and grafts: Secondary | ICD-10-CM

## 2017-03-04 DIAGNOSIS — Z5111 Encounter for antineoplastic chemotherapy: Secondary | ICD-10-CM

## 2017-03-04 LAB — COMPREHENSIVE METABOLIC PANEL
ALBUMIN: 3.8 g/dL (ref 3.5–5.0)
ALK PHOS: 95 U/L (ref 40–150)
ALT: 27 U/L (ref 0–55)
AST: 24 U/L (ref 5–34)
Anion Gap: 9 mEq/L (ref 3–11)
BILIRUBIN TOTAL: 1.09 mg/dL (ref 0.20–1.20)
BUN: 10.3 mg/dL (ref 7.0–26.0)
CALCIUM: 9.3 mg/dL (ref 8.4–10.4)
CO2: 25 mEq/L (ref 22–29)
CREATININE: 0.9 mg/dL (ref 0.7–1.3)
Chloride: 105 mEq/L (ref 98–109)
EGFR: >90 mL/min/{1.73_m2} (ref >90)
GLUCOSE: 217 mg/dL — AB (ref 70–140)
POTASSIUM: 4 meq/L (ref 3.5–5.1)
SODIUM: 139 meq/L (ref 136–145)
TOTAL PROTEIN: 7 g/dL (ref 6.4–8.3)

## 2017-03-04 LAB — CBC WITH DIFFERENTIAL/PLATELET
BASO%: 0.6 % (ref 0.0–2.0)
BASOS ABS: 0 10*3/uL (ref 0.0–0.1)
EOS%: 3.4 % (ref 0.0–7.0)
Eosinophils Absolute: 0.2 10*3/uL (ref 0.0–0.5)
HEMATOCRIT: 45.8 % (ref 38.4–49.9)
HEMOGLOBIN: 15.3 g/dL (ref 13.0–17.1)
LYMPH#: 1.1 10*3/uL (ref 0.9–3.3)
LYMPH%: 17.8 % (ref 14.0–49.0)
MCH: 31.9 pg (ref 27.2–33.4)
MCHC: 33.4 g/dL (ref 32.0–36.0)
MCV: 95.6 fL (ref 79.3–98.0)
MONO#: 0.7 10*3/uL (ref 0.1–0.9)
MONO%: 11 % (ref 0.0–14.0)
NEUT%: 67.2 % (ref 39.0–75.0)
NEUTROS ABS: 4.1 10*3/uL (ref 1.5–6.5)
Platelets: 82 10*3/uL — ABNORMAL LOW (ref 140–400)
RBC: 4.79 10*6/uL (ref 4.20–5.82)
RDW: 15 % — AB (ref 11.0–14.6)
WBC: 6.2 10*3/uL (ref 4.0–10.3)

## 2017-03-04 MED ORDER — SODIUM CHLORIDE 0.9 % IV SOLN
400.0000 mg/m2 | Freq: Once | INTRAVENOUS | Status: AC
  Administered 2017-03-04: 964 mg via INTRAVENOUS
  Filled 2017-03-04: qty 48.2

## 2017-03-04 MED ORDER — DEXAMETHASONE SODIUM PHOSPHATE 10 MG/ML IJ SOLN
INTRAMUSCULAR | Status: AC
  Filled 2017-03-04: qty 1

## 2017-03-04 MED ORDER — PALONOSETRON HCL INJECTION 0.25 MG/5ML
INTRAVENOUS | Status: AC
  Filled 2017-03-04: qty 5

## 2017-03-04 MED ORDER — PALONOSETRON HCL INJECTION 0.25 MG/5ML
0.2500 mg | Freq: Once | INTRAVENOUS | Status: AC
  Administered 2017-03-04: 0.25 mg via INTRAVENOUS

## 2017-03-04 MED ORDER — SODIUM CHLORIDE 0.9% FLUSH
10.0000 mL | INTRAVENOUS | Status: DC | PRN
  Administered 2017-03-04: 10 mL via INTRAVENOUS
  Filled 2017-03-04: qty 10

## 2017-03-04 MED ORDER — DEXAMETHASONE SODIUM PHOSPHATE 10 MG/ML IJ SOLN
10.0000 mg | Freq: Once | INTRAMUSCULAR | Status: AC
Start: 2017-03-04 — End: 2017-03-04
  Administered 2017-03-04: 10 mg via INTRAVENOUS

## 2017-03-04 MED ORDER — TRAMADOL HCL 50 MG PO TABS: 50.0000 mg | ORAL_TABLET | Freq: Four times a day (QID) | ORAL | 0 refills | Status: DC | PRN

## 2017-03-04 MED ORDER — SODIUM CHLORIDE 0.9 % IV SOLN
2400.0000 mg/m2 | INTRAVENOUS | Status: DC
  Administered 2017-03-04: 5800 mg via INTRAVENOUS
  Filled 2017-03-04: qty 116

## 2017-03-04 NOTE — Patient Instructions (Signed)
[place] Discharge Instructions for Patients Receiving Chemotherapy  Today you received the following chemotherapy agents Leucovorin and Adrucil  To help prevent nausea and vomiting after your treatment, we encourage you to take your nausea medication as directed. No Zofran for 3 days. Take Compazine instead.    If you develop nausea and vomiting that is not controlled by your nausea medication, call the clinic.   BELOW ARE SYMPTOMS THAT SHOULD BE REPORTED IMMEDIATELY:  *FEVER GREATER THAN 100.5 F  *CHILLS WITH OR WITHOUT FEVER  NAUSEA AND VOMITING THAT IS NOT CONTROLLED WITH YOUR NAUSEA MEDICATION  *UNUSUAL SHORTNESS OF BREATH  *UNUSUAL BRUISING OR BLEEDING  TENDERNESS IN MOUTH AND THROAT WITH OR WITHOUT PRESENCE OF ULCERS  *URINARY PROBLEMS  *BOWEL PROBLEMS  UNUSUAL RASH Items with * indicate a potential emergency and should be followed up as soon as possible.  Feel free to call the clinic you have any questions or concerns. The clinic phone number is [phone].  Please show the [facility] at check-in to the Emergency Department and triage nurse.

## 2017-03-04 NOTE — Patient Instructions (Signed)

## 2017-03-04 NOTE — Patient Instructions (Signed)
Thank you for choosing [facility] to provide your oncology and hematology care.  To afford each patient quality time with our providers, please arrive 30 minutes before your scheduled appointment time.  If you arrive late for your appointment, you may be asked to reschedule.  We strive to give you quality time with our providers, and arriving late affects you and other patients whose appointments are after yours.   If you are a no show for multiple scheduled visits, you may be dismissed from the clinic at the providers discretion.    Again, thank you for choosing [facility], our hope is that these requests will decrease the amount of time that you wait before being seen by our physicians.  ______________________________________________________________________  Should you have questions after your visit to the  Cancer Center, please contact our office at [phone] between the hours of 8:30 and 4:30 p.m.    Voicemails left after 4:30p.m will not be returned until the following business day.    For prescription refill requests, please have your pharmacy contact us directly.  Please also try to allow 48 hours for prescription requests.    Please contact the scheduling department for questions regarding scheduling.  For scheduling of procedures such as PET scans, CT scans, MRI, Ultrasound, etc please contact central scheduling at [phone].    Resources For Cancer Patients and Caregivers:   Oncolink.org:  A wonderful resource for patients and healthcare providers for information regarding your disease, ways to tract your treatment, what to expect, etc.     American Cancer Society:  800-227-2345  Can help patients locate various types of support and financial assistance  Cancer Care: 1-800-813-HOPE (4673) Provides financial assistance, online support groups, medication/co-pay assistance.    [place] DSS:  336-641-3447 Where to apply for food  stamps, Medicaid, and utility assistance  Medicare Rights Center: 800-333-4114 Helps people with Medicare understand their rights and benefits, navigate the Medicare system, and secure the quality healthcare they deserve  SCAT: 336-333-6589 [place] [employer]'s shared-ride transportation service for eligible riders who have a disability that prevents them from riding the fixed route bus.    For additional information on assistance programs please contact our social worker:   Grier Hock/Abigail Elmore:  [phone]            

## 2017-03-04 NOTE — Progress Notes (Signed)
Ok to treat with platelet count today per Lavella Lemons, RN per MD Irene Limbo.

## 2017-03-06 ENCOUNTER — Ambulatory Visit (HOSPITAL_BASED_OUTPATIENT_CLINIC_OR_DEPARTMENT_OTHER): Payer: Self-pay

## 2017-03-06 VITALS — BP 126/75 | HR 94 | Temp 99.3°F | Resp 18

## 2017-03-06 DIAGNOSIS — C182 Malignant neoplasm of ascending colon: Secondary | ICD-10-CM

## 2017-03-06 DIAGNOSIS — C189 Malignant neoplasm of colon, unspecified: Secondary | ICD-10-CM

## 2017-03-06 DIAGNOSIS — C787 Secondary malignant neoplasm of liver and intrahepatic bile duct: Secondary | ICD-10-CM

## 2017-03-06 MED ORDER — HEPARIN SOD (PORK) LOCK FLUSH 100 UNIT/ML IV SOLN
500.0000 [IU] | Freq: Once | INTRAVENOUS | Status: AC | PRN
  Administered 2017-03-06: 500 [IU]
  Filled 2017-03-06: qty 5

## 2017-03-06 MED ORDER — SODIUM CHLORIDE 0.9% FLUSH
10.0000 mL | INTRAVENOUS | Status: DC | PRN
Start: 2017-03-06 — End: 2017-03-06
  Administered 2017-03-06: 10 mL
  Filled 2017-03-06: qty 10

## 2017-03-07 NOTE — Progress Notes (Signed)
[name]    HEMATOLOGY/ONCOLOGY CLINIC NOTE  Date of Service: .03/04/2017  Patient Care Team: [name], MD as PCP - General (Family Medicine) [name] M.D. 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 KRAS mutated BRAF, NRAS mutation neg MSI Stable.  PET/CT scan done on 06/25/2016 Show multiple foci of hypermetabolic hepatic metastases (atleast 4-5) and 2 hypermetabolic peritoneal nodules along the dorsal peritoneal surface concerning for peritoneal metastases.  MRI Liver 07/27/2016 - Multiple small liver metastases throughout the right and left hepatic lobes, largest measuring 2.1 cm. 1.2 cm enhancing peritoneal nodule in left paracolic gutter, suspicious for peritoneal metastasis. Other small peritoneal nodules better visualized on recent PET-CT which showed hypermetabolic activity, also suspicious for peritoneal metastases.   CT CHEST WO CONTRAST2/26/2018 [facility] Result Impression   1. Congenital variant of bilateral middle lobes. 2. Groundglass opacification with regions of bronchiectasis in the right lower lobe adjacent to the fissure, consistent with inflammatory/infectious changes. 3. No definitive evidence of metastatic disease to the chest. 4. Probable sebaceous cyst in the subcutaneous tissues beneath the left anterior chest.   MR ABDOMEN AND PELVIS [id number] FOLLOW UP2/26/2018 [facility] Result Impression    Decreased size of multiple hepatic lesions and two peritoneal nodules compared to outside MRI 07/27/2016. No new lesions identified in  the abdomen or pelvis.     PREVIOUS TREATMENT  FOLFOX x 10 cycles. 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 (without 5FU bolus - due to thrombocytopenia). Plan for bilobar hepatic Y90 radio-embolization.  INTERVAL HISTORY  Patient is here for follow-up for his metastatic colon cancer. He has completed his initial planning for Y-90 radial embolization at [facility]. They plan to do one liver lobe at the time. He notes he is feeling well with some improvement in his neuropathy. His blood sugars have been better. Good energy levels. Other than some mild thrombocytopenia has had no issues with maintenance 5-FU leucovorin chemotherapy. We discussed that we will plan to continue maintenance chemotherapy unless there are any liver function issues related to his radio embolization. He is having some neck pain from degenerative disc disease which is being controlled with tramadol when necessary-refill given.  MEDICAL HISTORY:  Past Medical History:  Diagnosis Date  . Anemia   . Arthritis   . Cancer ([place])   . Diabetes mellitus without complication (HCC)    diet controlled  . History of blood transfusion   . Hyperlipidemia   . Sleep apnea    cpap  . Wears glasses     SURGICAL HISTORY: Past Surgical History:  Procedure Laterality Date  . COLON SURGERY    . IR GENERIC HISTORICAL  09/24/2016   IR CV LINE INJECTION 09/24/2016 [facility]  . IR GENERIC HISTORICAL  09/24/2016   IR US GUIDE VASC ACCESS RIGHT 09/24/2016 [facility]  . IR GENERIC HISTORICAL  09/24/2016   IR FLUORO GUIDE CV LINE RIGHT 09/24/2016 [facility]  . IR GENERIC HISTORICAL  09/30/2016   IR FLUORO GUIDE PORT INSERTION  RIGHT 09/30/2016 Markus Daft, MD [facility]  . IR GENERIC HISTORICAL  09/30/2016   IR US GUIDE VASC ACCESS RIGHT 09/30/2016 Markus Daft, MD [facility]  . IR  GENERIC HISTORICAL  09/30/2016   IR REMOVAL TUN ACCESS W/ PORT W/O FL MOD SED 09/30/2016 Markus Daft, MD [facility]  . LAPAROSCOPIC RIGHT HEMI COLECTOMY Right 06/17/2016   Procedure: LAPAROSCOPIC ASSISTED  RIGHT HEMI COLECTOMY;  Surgeon: [name], MD;  Location: [facility];  Service: General;  Laterality: Right;  . PORTACATH PLACEMENT Left 07/13/2016   Procedure: INSERTION PORT-A-CATH left subclavian;  Surgeon: [name], MD;  Location: [facility];  Service: General;  Laterality: Left;  . SHOULDER ACROMIOPLASTY Right 12/20/2014   Procedure: SHOULDER ACROMIOPLASTY;  Surgeon: Melrose Nakayama, MD;  Location: [place];  Service: Orthopedics;  Laterality: Right;  . SHOULDER ARTHROSCOPY Right 12/20/2014   Procedure: RIGHT ARTHROSCOPY SHOULDER WITH DEBRIDEMENT;  Surgeon: Melrose Nakayama, MD;  Location: [place];  Service: Orthopedics;  Laterality: Right;  . TESTICLE SURGERY     as teen  . TONSILLECTOMY    . WISDOM TOOTH EXTRACTION      SOCIAL HISTORY: Social History   Social History  . Marital status: Married    Spouse name: N/A  . Number of children: N/A  . Years of education: N/A   Occupational History  . cemetery maintenance    Social History Main Topics  . Smoking status: Former Smoker    Packs/day: 1.50    Years: 29.00    Types: Cigarettes    Quit date: 12/16/2012  . Smokeless tobacco: Never Used     Comment: 1-2 ppd from age [age]y to 2014  . Alcohol use 1.8 oz/week    3 Shots of liquor per week     Comment:  some days  . Drug use: No  . Sexual activity: Not on file   Other Topics Concern  . Not on file   Social History Narrative  . No narrative on file    FAMILY HISTORY: Family History  Problem Relation Age of Onset  . Diabetes Other   . Hyperlipidemia Other   . Hypertension Other   . Breast cancer Maternal Aunt 70  . Prostate cancer Maternal Uncle 75  . Lung cancer Paternal Uncle 4  . Stroke Maternal Grandfather 72  . Cancer  Paternal Grandmother 58       dx cancer of pancreas and colon, unknown if separate primaries  . Heart Problems Paternal Grandfather        d. 13  . Prostate cancer Maternal Uncle 44  . Breast cancer Maternal Aunt 75  . Breast cancer Maternal Aunt 68  . Cervical cancer Maternal Aunt 68  . Pancreatic cancer Maternal Aunt 54       d. 58y; heavy smoker  . Colon cancer Paternal Uncle 67       s/p partial colectomy; dx. second colon cancer at age 80-64    ALLERGIES:  is allergic to penicillins.  MEDICATIONS:  Current Outpatient Prescriptions  Medication Sig Dispense Refill  . dexamethasone (DECADRON) 4 MG tablet Take 2 tablets (8 mg total) by mouth daily. Start the day after chemotherapy for 2 days. Take with food. 30 tablet 0  . DULoxetine (CYMBALTA) 60 MG capsule Take 1 capsule (60 mg total) by mouth daily. 30 capsule 2  . lidocaine-prilocaine (EMLA) cream Apply to affected area once 30 g 3  . LORazepam (ATIVAN) 0.5 MG tablet Take 1 tablet (0.5 mg  total) by mouth every 8 (eight) hours. For anxiety or sleep 30 tablet 0  . Multiple Vitamin (MULTIVITAMIN) tablet Take 1 tablet by mouth daily.    . ondansetron (ZOFRAN) 8 MG tablet Take 1 tablet (8 mg total) by mouth 2 (two) times daily as needed for refractory nausea / vomiting. Start on day 3 after chemotherapy. 30 tablet 1  . Probiotic Product (PROBIOTIC PO) Take 1 tablet by mouth daily.    . prochlorperazine (COMPAZINE) 10 MG tablet Take 1 tablet (10 mg total) by mouth every 6 (six) hours as needed (Nausea or vomiting). 30 tablet 1  . traMADol (ULTRAM) 50 MG tablet Take 1 tablet (50 mg total) by mouth every 6 (six) hours as needed for moderate pain or severe pain. 60 tablet 0   No current facility-administered medications for this visit.     REVIEW OF SYSTEMS:    10 Point review of Systems was done is negative except as noted above.  PHYSICAL EXAMINATION: ECOG PERFORMANCE STATUS: 1 - Symptomatic but completely  ambulatory VS GENERAL:alert, in no acute distress and comfortable SKIN: skin color, texture, turgor are normal, no rashes or significant lesions EYES: normal, conjunctiva are pink and non-injected, sclera clear OROPHARYNX:no exudate, no erythema and lips, buccal mucosa, and tongue normal  NECK: supple, no JVD, thyroid normal size, non-tender, without nodularity LYMPH:  no palpable lymphadenopathy in the cervical, axillary or inguinal LUNGS: clear to auscultation with normal respiratory effort HEART: regular rate & rhythm,  no murmurs and no lower extremity edema ABDOMEN: abdomen soft, bowel sounds normoactive, healing laparoscopic surgical incisions with no discharge look clean. Musculoskeletal: no cyanosis of digits and no clubbing  PSYCH: alert & oriented x 3 with fluent speech NEURO: no focal motor/sensory deficits   LABORATORY DATA:  I have reviewed the data as listed  .[name] CBC Latest Ref Rng & Units 03/04/2017 02/18/2017 02/04/2017  WBC 4.0 - 10.3 10e3/uL 6.2 4.9 3.0(L)  Hemoglobin 13.0 - 17.1 g/dL 15.3 14.6 14.7  Hematocrit 38.4 - 49.9 % 45.8 44.7 44.2  Platelets 140 - 400 10e3/uL 82(L) 77(L) 96(L)   . CMP Latest Ref Rng & Units 03/04/2017 02/18/2017 02/04/2017  Glucose 70 - 140 mg/dl 217(H) 99 155(H)  BUN 7.0 - 26.0 mg/dL 10.3 9.0 7.1  Creatinine 0.7 - 1.3 mg/dL 0.9 0.8 0.9  Sodium 136 - 145 mEq/L 139 141 139  Potassium 3.5 - 5.1 mEq/L 4.0 4.1 4.1  Chloride 101 - 111 mmol/L - - -  CO2 22 - 29 mEq/L 25 27 24   Calcium 8.4 - 10.4 mg/dL 9.3 9.9 9.6  Total Protein 6.4 - 8.3 g/dL 7.0 6.9 6.8  Total Bilirubin 0.20 - 1.20 mg/dL 1.09 1.09 0.87  Alkaline Phos 40 - 150 U/L 95 94 112  AST 5 - 34 U/L 24 39(H) 45(H)  ALT 0 - 55 U/L 27 48 49        Microscopic Comment 2. COLON AND RECTUM (INCLUDING TRANS-ANAL RESECTION): Specimen: Terminal ileum, right colon and appendix Procedure: Segmental resection Tumor site: Proximal ascending colon Specimen integrity: Intact Macroscopic  intactness of mesorectum: Not applicable: x Complete: NA Near complete: NA Incomplete: NA Cannot be determined (specify): NA Macroscopic tumor perforation: The mass invades through muscularis propria into pericolonic soft tissue Invasive tumor: Maximum size: 5.5 cm Histologic type(s): Adenocarcinoma Histologic grade and differentiation: G3 G1: well differentiated/low grade 1 of 4 Supplemental copy SUPPLEMENTAL for Nhan, [place] ([id number]) Microscopic Comment(continued) G2: moderately differentiated/low grade G3: poorly differentiated/high grade G4: undifferentiated/high grade Type  of polyp in which invasive carcinoma arose: Tubular adenoma Microscopic extension of invasive tumor: The mass invade through the muscularis propria into pericolonic soft tissue Lymph-Vascular invasion: Identified Peri-neural invasion: Identified Tumor deposit(s) (discontinuous extramural extension): Present Resection margins: Proximal margin: Negative Distal margin: Negative Circumferential (radial) (posterior ascending, posterior descending; lateral and posterior mid-rectum; and entire lower 1/3 rectum):Negative Mesenteric margin (sigmoid and transverse): NA Distance closest margin (if all above margins negative): 3.7 cm from the non peritonealized pericolic soft tissue margin Trans-anal resection margins only: Deep margin: NA Mucosal Margin: NA Distance closest mucosal margin (if negative): NA Treatment effect (neo-adjuvant therapy): NA Additional polyp(s): Negative Non-neoplastic findings: Unremarkable Lymph nodes: number examined 18; number positive: 5 Pathologic Staging: pT3, N2a, M1a Ancillary studies: MSI ordered      RADIOGRAPHIC STUDIES: I have personally reviewed the radiological images as listed and agreed with the findings in the report. No results found. 2018 February MR ABDOMEN AND PELVIS WWOTUMOR FOLLOW UP2/26/2018 [facility] Result Impression     Decreased size of multiple hepatic lesions and two peritoneal nodules compared to outside MRI 07/27/2016. No new lesions identified in the abdomen or pelvis.  Result Narrative  MR ABDOMEN AND PELVIS WITH AND WITHOUT CONTRAST (TUMOR FOLLOW-UP PROTOCOL), 10/12/2016 5:59 PM   INDICATION: liver and peritoneal colon cancer metastases \ C18.9 Metastatic colon cancer to liver (HCC) \ C78.7 Metastatic colon cancer to liver ([place]) \ C78.6 Peritoneal carcinomatosis ([place]) \ C80.1 Peritoneal carcinomatosis ([place])   ADDITIONAL HISTORY: History of stage IV colorectal cancer status post laparoscopic right hemicolectomy on 06/17/2016. At time of surgery, tumor nodule in the right lobe of the liver was positive on biopsy for adenocarcinoma. Patient currently undergoing chemotherapy.  COMPARISON: Outside studies including CT chest abdomen pelvis 05/23/2016, PET CT 07/15/2016, and MR abdomen 07/27/2016   TECHNIQUE: Multiplanar, multisequence MR images of the abdomen and pelvis were obtained before and after intravenous administration of gadolinium-based contrast.   FINDINGS:  LOWER CHEST Heart: Normal size. No pericardial effusion. Lungs/pleura: No masses, consolidations, or effusions.  ABDOMEN Liver: Liver is enlarged, measuring 20 cm in greatest craniocaudal dimension. Hepatic steatosis. Multiple T2 hyperintense and T1 iso to hypointense lesions throughout the liver as marked in series 16, decreased in size compared to outside MR 07/27/2016. Largest lesion is in segment 8 and measures 1.4 cm and demonstrates peripheral enhancement. No new lesion. Gallbladder: Normal. No stones or inflammatory changes. Biliary: No obstruction. Spleen: Spleen is mildly enlarged, measuring 13.8 cm in greatest length. Pancreas: Normal. Adrenals: Normal. Kidneys: Multiple T2 hyperintense nonenhancing lesions in the lower pole of the kidneys bilaterally, consistent with cysts. No hydronephrosis. Stomach/bowel: Right  hemicolectomy. Peritoneum: Decreased size of peritoneal nodule in the left paracolic gutter measuring 6 mm (series 10, image 45). Decreased size of peritoneal nodule in the left lower quadrant along the ventral abdominal wall measuring 8 mm (series 23, image 13). No apparent new measurable lesions. Extraperitoneum: Within normal limits. Vascular: Within normal limits.  PELVIS Ureters: Within normal limits. Bladder: Within normal limits. Reproductive system: Within normal limits.  MSK: Lower midline ventral abdominal wall scar.   CT CHEST WO CONTRAST2/26/2018 [facility] Result Impression   1. Congenital variant of bilateral middle lobes. 2. Groundglass opacification with regions of bronchiectasis in the right lower lobe adjacent to the fissure, consistent with inflammatory/infectious changes. 3. No definitive evidence of metastatic disease to the chest. 4. Probable sebaceous cyst in the subcutaneous tissues beneath the left anterior chest.  Result Narrative  CT CHEST WO CONTRAST, 10/12/2016 3:24 PM  INDICATION:stage IV colon cancer \ C18.9 Metastatic colon cancer to liver (HCC) \ C78.7 Metastatic colon cancer to liver (HCC) \ C78.6 Peritoneal carcinomatosis ([place]) \ C80.1 Peritoneal carcinomatosis ([place])  COMPARISON: None  TECHNIQUE: Multislice axial images were obtained through the chest without administration of iodinated intravenous contrast material. Multi-planar reformatted images were generated for additional analysis. Nongated technique limits cardiac detail.  All CT scans at [facility] and [place] are performed using dose optimization techniques as appropriate to a performed exam, including but not limited to one or more of the following: automated exposure control, adjustment of the mA and/or kV according to patient size, use of iterative reconstruction technique. In addition, [facility] is participating in the [place] program which will further assist [place] in optimizing patient radiation exposure.   FINDINGS:   Thoracic inlet/central airways: Parenchyma is within normal limits for age. The trachea is patent. Mediastinum/hila/axilla: Scattered, nonpathologically enlarged nodes. Heart/vessels: Normal heart size. Trace pericardial effusion. Coronary artery calcifications.. The tip of the central line is in the distal SVC. Lungs/pleura: There are bilateral middle lobes. Subtle groundglass opacification surrounding the posterior aspect of the right major fissure. This is seen in association with some bronchiectasis. Upper abdomen: See concurrent MRI study. Chest wall/MSK: Multi-segmented sternum. 1.5 cm low-density soft tissue nodule in the subcutaneous tissues beneath the left anterior chest.     MR LIVER WWO CONTRAST5/18/2018 [facility] Result Impression   1.When compared to the February 2018 MRI, there has been no interval change in the multiple T2 hyperintense lesions scattered throughout the liver compatible with metastases. When compared to the initial MRI performed December 2017, lesions have decreased in size. 2.No evidence of new hepatic or peritoneal lesions.    CT CHEST ABDOMEN PELVIS W CONTRAST (ROUTINE)01/01/2017 [facility] Result Impression   1. Most of the small hepatic lesions noted on prior MRI are not seen on today's CT, likely due to technical differences inherent to the modalities. The largest lesion noted in segment 8 on the prior examination is unchanged in size. 2. Tiny implant along the upper left paracolic gutter, quite similar to the February 2018 MRI. No ascites or new peritoneal lesions are otherwise evident. 3. Status post right hemicolectomy. 4. Hepatic steatosis. 5. Additional ancillary findings as above.     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 KRAS mutated BRAF, NRAS mutation neg MSI Stable.  PET/CT scan done on 06/25/2016 Show multiple foci of hypermetabolic hepatic metastases (atleast 4-5) and 2 hypermetabolic peritoneal nodules along the dorsal peritoneal surface concerning for peritoneal metastases.  MRI Liver 07/27/2016 - Multiple small liver metastases throughout the right and left hepatic lobes, largest measuring 2.1 cm. 1.2 cm enhancing peritoneal nodule in left paracolic gutter, suspicious for peritoneal metastasis. Other small peritoneal nodules better visualized on recent PET-CT which showed hypermetabolic activity, also suspicious for peritoneal metastases.   MRI Abd 10/12/2016 at [facility] --shows improvement in all the liver and the 2 peritoneal lesions and no new lesions.  CEA level continues to improve.    MRI liver and CT C/A/P on 01/01/2017 as noted above showed improved/stable disease.  2) Mild thrombocytopenia PLT 82k (already have held 5FU bolus dose) . Platelets have remained stable.  PLAN -no overt prohibitive toxicities from 5FU/leucovorin except mild thrombocytopenia -continue 5FU/LV without bolus -f/u with [facility] for serial hepatic Y90 radio-embolization for better control of hepatic metastatic disease. -grade 1 neuropathy stable/imporved --continue Cymbalta 72m po daily. -conitnue follow up with Dr. SMoreen Fowlerto optimize control of his diabetes. -continue maintenance 5FU/leucovorin q2weeks. Might need to switch to q3weeks if thrombocytopenia becomes limiting <75k or will cut down 5FU dose. -will continue maintenance 5FU/leucovorin for controlling peritoneal disease and may consider rad onc consultation for RT to residual peritoneal disease post liver directed therapies after completion of Y-90 hepatic embolization.   #2 Rt upper jaw dental pain  --periodonitis resolved, orthopantogram with no abscess. resolved  #3. 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  Iron def resolved. 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 Chemotherapy-induced neuropathy ([place] 11/18/2016  . Genetic testing 10/30/2016  . Port catheter in place 09/25/2016  . Family history of colon cancer 07/24/2016  . Family history of prostate cancer 07/24/2016  . Metastatic colon cancer to liver ([place] 07/01/2016  . Right Colon cancer metastasized to liver ([place] 06/17/2016  . Chronic GI bleeding 05/29/2016  . Diabetes mellitus type 2, diet-controlled ([place] 05/22/2016  . OSA (obstructive sleep apnea) 05/22/2016  . Hyperlipidemia 05/22/2016  . Anemia due to blood loss, chronic   . Antineoplastic chemotherapy induced pancytopenia (CODE) ([place] 05/21/2016   -Continue follow-up with primary care physician .SAntony Contras MD for optimization of diabetes management . Might need basal insulin if his blood sugars are elevated with chemotherapy/ steroids . -Continue use of CPAP for sleep apnea . -off statins   Continue 5FU/LV q2weeks. Please schedule next 4 cycles -labs with each cycle -clinic visit with Dr KIrene Limboevery other cycle   All of the patients questions were answered with apparent satisfaction. The patient knows to call the clinic with any problems, questions or concerns.  I spent 25 minutes counseling the patient face to face. The total time spent in the appointment was 30 minutes and more than 50% was on counseling and direct patient cares.    GSullivan LoneMD MEspinoAAHIVMS SUrmc Strong WestCHalifax Health Medical Center- Port OrangeHematology/Oncology Physician CNew [facility] (Office):       [phone](Work cell):  [phone](Fax):           [phone]

## 2017-03-10 DIAGNOSIS — C787 Secondary malignant neoplasm of liver and intrahepatic bile duct: Secondary | ICD-10-CM | POA: Diagnosis not present

## 2017-03-12 DIAGNOSIS — C189 Malignant neoplasm of colon, unspecified: Secondary | ICD-10-CM | POA: Diagnosis not present

## 2017-03-12 DIAGNOSIS — C787 Secondary malignant neoplasm of liver and intrahepatic bile duct: Secondary | ICD-10-CM | POA: Diagnosis not present

## 2017-03-14 DIAGNOSIS — C189 Malignant neoplasm of colon, unspecified: Secondary | ICD-10-CM | POA: Diagnosis not present

## 2017-03-17 ENCOUNTER — Other Ambulatory Visit: Payer: Self-pay

## 2017-03-17 DIAGNOSIS — C787 Secondary malignant neoplasm of liver and intrahepatic bile duct: Principal | ICD-10-CM

## 2017-03-17 DIAGNOSIS — C189 Malignant neoplasm of colon, unspecified: Secondary | ICD-10-CM

## 2017-03-18 ENCOUNTER — Ambulatory Visit: Payer: 59

## 2017-03-18 ENCOUNTER — Other Ambulatory Visit (HOSPITAL_BASED_OUTPATIENT_CLINIC_OR_DEPARTMENT_OTHER): Payer: 59

## 2017-03-18 ENCOUNTER — Ambulatory Visit (HOSPITAL_BASED_OUTPATIENT_CLINIC_OR_DEPARTMENT_OTHER): Payer: 59

## 2017-03-18 VITALS — BP 131/85 | HR 83 | Temp 98.0°F | Resp 20

## 2017-03-18 DIAGNOSIS — Z5111 Encounter for antineoplastic chemotherapy: Secondary | ICD-10-CM

## 2017-03-18 DIAGNOSIS — C787 Secondary malignant neoplasm of liver and intrahepatic bile duct: Secondary | ICD-10-CM

## 2017-03-18 DIAGNOSIS — C189 Malignant neoplasm of colon, unspecified: Secondary | ICD-10-CM

## 2017-03-18 DIAGNOSIS — D500 Iron deficiency anemia secondary to blood loss (chronic): Secondary | ICD-10-CM

## 2017-03-18 DIAGNOSIS — C182 Malignant neoplasm of ascending colon: Secondary | ICD-10-CM

## 2017-03-18 DIAGNOSIS — Z95828 Presence of other vascular implants and grafts: Secondary | ICD-10-CM

## 2017-03-18 LAB — CBC WITH DIFFERENTIAL/PLATELET
BASO%: 0.4 % (ref 0.0–2.0)
Basophils Absolute: 0 10*3/uL (ref 0.0–0.1)
EOS ABS: 0.2 10*3/uL (ref 0.0–0.5)
EOS%: 3.6 % (ref 0.0–7.0)
HEMATOCRIT: 43 % (ref 38.4–49.9)
HGB: 14.3 g/dL (ref 13.0–17.1)
LYMPH#: 0.9 10*3/uL (ref 0.9–3.3)
LYMPH%: 16.8 % (ref 14.0–49.0)
MCH: 31.6 pg (ref 27.2–33.4)
MCHC: 33.3 g/dL (ref 32.0–36.0)
MCV: 94.9 fL (ref 79.3–98.0)
MONO#: 0.5 10*3/uL (ref 0.1–0.9)
MONO%: 10.3 % (ref 0.0–14.0)
NEUT#: 3.5 10*3/uL (ref 1.5–6.5)
NEUT%: 68.9 % (ref 39.0–75.0)
PLATELETS: 94 10*3/uL — AB (ref 140–400)
RBC: 4.53 10*6/uL (ref 4.20–5.82)
RDW: 14.9 % — ABNORMAL HIGH (ref 11.0–14.6)
WBC: 5.1 10*3/uL (ref 4.0–10.3)

## 2017-03-18 LAB — COMPREHENSIVE METABOLIC PANEL
ALT: 37 U/L (ref 0–55)
ANION GAP: 9 meq/L (ref 3–11)
AST: 33 U/L (ref 5–34)
Albumin: 3.7 g/dL (ref 3.5–5.0)
Alkaline Phosphatase: 117 U/L (ref 40–150)
BILIRUBIN TOTAL: 1.17 mg/dL (ref 0.20–1.20)
BUN: 7.5 mg/dL (ref 7.0–26.0)
CALCIUM: 9.8 mg/dL (ref 8.4–10.4)
CHLORIDE: 103 meq/L (ref 98–109)
CO2: 26 mEq/L (ref 22–29)
CREATININE: 0.8 mg/dL (ref 0.7–1.3)
Glucose: 223 mg/dl — ABNORMAL HIGH (ref 70–140)
Potassium: 4.1 mEq/L (ref 3.5–5.1)
Sodium: 138 mEq/L (ref 136–145)
TOTAL PROTEIN: 6.9 g/dL (ref 6.4–8.3)

## 2017-03-18 MED ORDER — SODIUM CHLORIDE 0.9 % IV SOLN
2400.0000 mg/m2 | INTRAVENOUS | Status: DC
  Administered 2017-03-18: 5800 mg via INTRAVENOUS
  Filled 2017-03-18: qty 116

## 2017-03-18 MED ORDER — PALONOSETRON HCL INJECTION 0.25 MG/5ML
0.2500 mg | Freq: Once | INTRAVENOUS | Status: AC
  Administered 2017-03-18: 0.25 mg via INTRAVENOUS

## 2017-03-18 MED ORDER — DEXTROSE 5 % IV SOLN
Freq: Once | INTRAVENOUS | Status: AC
  Administered 2017-03-18: 10:00:00 via INTRAVENOUS

## 2017-03-18 MED ORDER — SODIUM CHLORIDE 0.9% FLUSH
10.0000 mL | INTRAVENOUS | Status: DC | PRN
  Administered 2017-03-18: 10 mL via INTRAVENOUS
  Filled 2017-03-18: qty 10

## 2017-03-18 MED ORDER — DEXAMETHASONE SODIUM PHOSPHATE 10 MG/ML IJ SOLN
INTRAMUSCULAR | Status: AC
  Filled 2017-03-18: qty 1

## 2017-03-18 MED ORDER — DEXAMETHASONE SODIUM PHOSPHATE 10 MG/ML IJ SOLN
10.0000 mg | Freq: Once | INTRAMUSCULAR | Status: AC
  Administered 2017-03-18: 10 mg via INTRAVENOUS

## 2017-03-18 MED ORDER — SODIUM CHLORIDE 0.9 % IV SOLN
400.0000 mg/m2 | Freq: Once | INTRAVENOUS | Status: AC
  Administered 2017-03-18: 964 mg via INTRAVENOUS
  Filled 2017-03-18: qty 48.2

## 2017-03-18 MED ORDER — PALONOSETRON HCL INJECTION 0.25 MG/5ML
INTRAVENOUS | Status: AC
  Filled 2017-03-18: qty 5

## 2017-03-18 NOTE — Patient Instructions (Signed)
[facility] Discharge Instructions for Patients Receiving Chemotherapy  Today you received the following chemotherapy agents Leucovorin/Adrucil  To help prevent nausea and vomiting after your treatment, we encourage you to take your nausea medication    If you develop nausea and vomiting that is not controlled by your nausea medication, call the clinic.   BELOW ARE SYMPTOMS THAT SHOULD BE REPORTED IMMEDIATELY:  *FEVER GREATER THAN 100.5 F  *CHILLS WITH OR WITHOUT FEVER  NAUSEA AND VOMITING THAT IS NOT CONTROLLED WITH YOUR NAUSEA MEDICATION  *UNUSUAL SHORTNESS OF BREATH  *UNUSUAL BRUISING OR BLEEDING  TENDERNESS IN MOUTH AND THROAT WITH OR WITHOUT PRESENCE OF ULCERS  *URINARY PROBLEMS  *BOWEL PROBLEMS  UNUSUAL RASH Items with * indicate a potential emergency and should be followed up as soon as possible.  Feel free to call the clinic you have any questions or concerns. The clinic phone number is [phone].  Please show the CHEMO ALERT CARD at check-in to the Emergency Department and triage nurse.   

## 2017-03-18 NOTE — Progress Notes (Signed)
Per Dr. Irene Limbo okay to tx with plt 94.

## 2017-03-20 ENCOUNTER — Ambulatory Visit (HOSPITAL_BASED_OUTPATIENT_CLINIC_OR_DEPARTMENT_OTHER): Payer: Self-pay

## 2017-03-20 VITALS — BP 131/81 | HR 82 | Temp 98.4°F | Resp 18

## 2017-03-20 DIAGNOSIS — C189 Malignant neoplasm of colon, unspecified: Secondary | ICD-10-CM

## 2017-03-20 DIAGNOSIS — C787 Secondary malignant neoplasm of liver and intrahepatic bile duct: Secondary | ICD-10-CM

## 2017-03-20 MED ORDER — HEPARIN SOD (PORK) LOCK FLUSH 100 UNIT/ML IV SOLN
500.0000 [IU] | Freq: Once | INTRAVENOUS | Status: AC | PRN
  Administered 2017-03-20: 500 [IU]
  Filled 2017-03-20: qty 5

## 2017-03-20 MED ORDER — SODIUM CHLORIDE 0.9% FLUSH
10.0000 mL | INTRAVENOUS | Status: DC | PRN
  Administered 2017-03-20: 10 mL
  Filled 2017-03-20: qty 10

## 2017-03-20 NOTE — Patient Instructions (Signed)
Implanted Port Home Guide An implanted port is a type of central line that is placed under the skin. Central lines are used to provide IV access when treatment or nutrition needs to be given through a person's veins. Implanted ports are used for long-term IV access. An implanted port may be placed because:  You need IV medicine that would be irritating to the small veins in your hands or arms.  You need long-term IV medicines, such as antibiotics.  You need IV nutrition for a long period.  You need frequent blood draws for lab tests.  You need dialysis.  Implanted ports are usually placed in the chest area, but they can also be placed in the upper arm, the abdomen, or the leg. An implanted port has two main parts:  Reservoir. The reservoir is round and will appear as a small, raised area under your skin. The reservoir is the part where a needle is inserted to give medicines or draw blood.  Catheter. The catheter is a thin, flexible tube that extends from the reservoir. The catheter is placed into a large vein. Medicine that is inserted into the reservoir goes into the catheter and then into the vein.  How will I care for my incision site? Do not get the incision site wet. Bathe or shower as directed by your health care provider. How is my port accessed? Special steps must be taken to access the port:  Before the port is accessed, a numbing cream can be placed on the skin. This helps numb the skin over the port site.  Your health care provider uses a sterile technique to access the port. ? Your health care provider must put on a mask and sterile gloves. ? The skin over your port is cleaned carefully with an antiseptic and allowed to dry. ? The port is gently pinched between sterile gloves, and a needle is inserted into the port.  Only "non-coring" port needles should be used to access the port. Once the port is accessed, a blood return should be checked. This helps ensure that the port  is in the vein and is not clogged.  If your port needs to remain accessed for a constant infusion, a clear (transparent) bandage will be placed over the needle site. The bandage and needle will need to be changed every week, or as directed by your health care provider.  Keep the bandage covering the needle clean and dry. Do not get it wet. Follow your health care provider's instructions on how to take a shower or bath while the port is accessed.  If your port does not need to stay accessed, no bandage is needed over the port.  What is flushing? Flushing helps keep the port from getting clogged. Follow your health care provider's instructions on how and when to flush the port. Ports are usually flushed with saline solution or a medicine called heparin. The need for flushing will depend on how the port is used.  If the port is used for intermittent medicines or blood draws, the port will need to be flushed: ? After medicines have been given. ? After blood has been drawn. ? As part of routine maintenance.  If a constant infusion is running, the port may not need to be flushed.  How long will my port stay implanted? The port can stay in for as long as your health care provider thinks it is needed. When it is time for the port to come out, surgery will be   done to remove it. The procedure is similar to the one performed when the port was put in. When should I seek immediate medical care? When you have an implanted port, you should seek immediate medical care if:  You notice a bad smell coming from the incision site.  You have swelling, redness, or drainage at the incision site.  You have more swelling or pain at the port site or the surrounding area.  You have a fever that is not controlled with medicine.  This information is not intended to replace advice given to you by your health care provider. Make sure you discuss any questions you have with your health care provider. Document  Released: 08/03/2005 Document Revised: 01/09/2016 Document Reviewed: 04/10/2013 Elsevier Interactive Patient Education  2017 [employer].  

## 2017-03-29 ENCOUNTER — Other Ambulatory Visit: Payer: Self-pay | Admitting: Hematology

## 2017-03-29 DIAGNOSIS — C189 Malignant neoplasm of colon, unspecified: Secondary | ICD-10-CM

## 2017-03-29 DIAGNOSIS — C787 Secondary malignant neoplasm of liver and intrahepatic bile duct: Principal | ICD-10-CM

## 2017-03-31 ENCOUNTER — Other Ambulatory Visit: Payer: Self-pay

## 2017-03-31 DIAGNOSIS — C189 Malignant neoplasm of colon, unspecified: Secondary | ICD-10-CM

## 2017-03-31 DIAGNOSIS — C787 Secondary malignant neoplasm of liver and intrahepatic bile duct: Principal | ICD-10-CM

## 2017-03-31 MED ORDER — DEXAMETHASONE 4 MG PO TABS: 8.0000 mg | ORAL_TABLET | Freq: Every day | ORAL | 0 refills | Status: DC

## 2017-04-01 ENCOUNTER — Ambulatory Visit (HOSPITAL_BASED_OUTPATIENT_CLINIC_OR_DEPARTMENT_OTHER): Payer: 59

## 2017-04-01 ENCOUNTER — Ambulatory Visit (HOSPITAL_BASED_OUTPATIENT_CLINIC_OR_DEPARTMENT_OTHER): Payer: 59 | Admitting: Hematology

## 2017-04-01 ENCOUNTER — Ambulatory Visit: Payer: 59

## 2017-04-01 ENCOUNTER — Encounter: Payer: Self-pay | Admitting: Hematology

## 2017-04-01 ENCOUNTER — Telehealth: Payer: Self-pay | Admitting: Hematology

## 2017-04-01 ENCOUNTER — Other Ambulatory Visit: Payer: Self-pay

## 2017-04-01 VITALS — BP 117/69 | HR 87 | Temp 98.9°F | Resp 18 | Ht 74.0 in | Wt 246.0 lb

## 2017-04-01 DIAGNOSIS — C787 Secondary malignant neoplasm of liver and intrahepatic bile duct: Secondary | ICD-10-CM | POA: Diagnosis not present

## 2017-04-01 DIAGNOSIS — D696 Thrombocytopenia, unspecified: Secondary | ICD-10-CM

## 2017-04-01 DIAGNOSIS — C189 Malignant neoplasm of colon, unspecified: Secondary | ICD-10-CM | POA: Diagnosis not present

## 2017-04-01 DIAGNOSIS — C182 Malignant neoplasm of ascending colon: Secondary | ICD-10-CM

## 2017-04-01 DIAGNOSIS — Z5111 Encounter for antineoplastic chemotherapy: Secondary | ICD-10-CM

## 2017-04-01 DIAGNOSIS — Z95828 Presence of other vascular implants and grafts: Secondary | ICD-10-CM

## 2017-04-01 DIAGNOSIS — C786 Secondary malignant neoplasm of retroperitoneum and peritoneum: Secondary | ICD-10-CM

## 2017-04-01 LAB — CBC WITH DIFFERENTIAL/PLATELET
BASO%: 0.5 % (ref 0.0–2.0)
BASOS ABS: 0 10*3/uL (ref 0.0–0.1)
EOS ABS: 0.2 10*3/uL (ref 0.0–0.5)
EOS%: 3.4 % (ref 0.0–7.0)
HEMATOCRIT: 43.3 % (ref 38.4–49.9)
HEMOGLOBIN: 14.6 g/dL (ref 13.0–17.1)
LYMPH#: 0.7 10*3/uL — AB (ref 0.9–3.3)
LYMPH%: 14.8 % (ref 14.0–49.0)
MCH: 31.8 pg (ref 27.2–33.4)
MCHC: 33.8 g/dL (ref 32.0–36.0)
MCV: 94.1 fL (ref 79.3–98.0)
MONO#: 0.5 10*3/uL (ref 0.1–0.9)
MONO%: 9.8 % (ref 0.0–14.0)
NEUT#: 3.6 10*3/uL (ref 1.5–6.5)
NEUT%: 71.5 % (ref 39.0–75.0)
PLATELETS: 104 10*3/uL — AB (ref 140–400)
RBC: 4.6 10*6/uL (ref 4.20–5.82)
RDW: 15.6 % — AB (ref 11.0–14.6)
WBC: 5 10*3/uL (ref 4.0–10.3)

## 2017-04-01 LAB — COMPREHENSIVE METABOLIC PANEL
ALBUMIN: 3.7 g/dL (ref 3.5–5.0)
ALK PHOS: 117 U/L (ref 40–150)
ALT: 26 U/L (ref 0–55)
ANION GAP: 10 meq/L (ref 3–11)
AST: 32 U/L (ref 5–34)
BILIRUBIN TOTAL: 0.89 mg/dL (ref 0.20–1.20)
BUN: 8.9 mg/dL (ref 7.0–26.0)
CALCIUM: 9.7 mg/dL (ref 8.4–10.4)
CO2: 25 mEq/L (ref 22–29)
CREATININE: 0.9 mg/dL (ref 0.7–1.3)
Chloride: 103 mEq/L (ref 98–109)
Glucose: 219 mg/dl — ABNORMAL HIGH (ref 70–140)
Potassium: 3.9 mEq/L (ref 3.5–5.1)
Sodium: 138 mEq/L (ref 136–145)
TOTAL PROTEIN: 7 g/dL (ref 6.4–8.3)

## 2017-04-01 LAB — CEA (IN HOUSE-CHCC): CEA (CHCC-In House): 3.28 ng/mL (ref 0.00–5.00)

## 2017-04-01 MED ORDER — DEXAMETHASONE SODIUM PHOSPHATE 10 MG/ML IJ SOLN
10.0000 mg | Freq: Once | INTRAMUSCULAR | Status: AC
  Administered 2017-04-01: 10 mg via INTRAVENOUS

## 2017-04-01 MED ORDER — SODIUM CHLORIDE 0.9 % IV SOLN
2400.0000 mg/m2 | INTRAVENOUS | Status: DC
  Administered 2017-04-01: 5800 mg via INTRAVENOUS
  Filled 2017-04-01: qty 116

## 2017-04-01 MED ORDER — PALONOSETRON HCL INJECTION 0.25 MG/5ML
0.2500 mg | Freq: Once | INTRAVENOUS | Status: AC
  Administered 2017-04-01: 0.25 mg via INTRAVENOUS

## 2017-04-01 MED ORDER — DEXAMETHASONE SODIUM PHOSPHATE 10 MG/ML IJ SOLN
INTRAMUSCULAR | Status: AC
  Filled 2017-04-01: qty 1

## 2017-04-01 MED ORDER — SODIUM CHLORIDE 0.9 % IJ SOLN
10.0000 mL | INTRAMUSCULAR | Status: AC
  Administered 2017-04-01: 10 mL via INTRAVENOUS
  Filled 2017-04-01: qty 10

## 2017-04-01 MED ORDER — HEPARIN SOD (PORK) LOCK FLUSH 100 UNIT/ML IV SOLN
500.0000 [IU] | Freq: Once | INTRAVENOUS | Status: DC
  Filled 2017-04-01: qty 5

## 2017-04-01 MED ORDER — PALONOSETRON HCL INJECTION 0.25 MG/5ML
INTRAVENOUS | Status: AC
  Filled 2017-04-01: qty 5

## 2017-04-01 MED ORDER — SODIUM CHLORIDE 0.9 % IV SOLN
400.0000 mg/m2 | Freq: Once | INTRAVENOUS | Status: AC
  Administered 2017-04-01: 964 mg via INTRAVENOUS
  Filled 2017-04-01: qty 48.2

## 2017-04-01 NOTE — Patient Instructions (Signed)
Thank you for choosing [facility] to provide your oncology and hematology care.  To afford each patient quality time with our providers, please arrive 30 minutes before your scheduled appointment time.  If you arrive late for your appointment, you may be asked to reschedule.  We strive to give you quality time with our providers, and arriving late affects you and other patients whose appointments are after yours.  If you are a no show for multiple scheduled visits, you may be dismissed from the clinic at the providers discretion.   Again, thank you for choosing [facility], our hope is that these requests will decrease the amount of time that you wait before being seen by our physicians.  ______________________________________________________________________ Should you have questions after your visit to the [facility], please contact our office at [phone] between the hours of 8:30 and 4:30 p.m.    Voicemails left after 4:30p.m will not be returned until the following business day.   For prescription refill requests, please have your pharmacy contact us directly.  Please also try to allow 48 hours for prescription requests.   Please contact the scheduling department for questions regarding scheduling.  For scheduling of procedures such as PET scans, CT scans, MRI, Ultrasound, etc please contact central scheduling at [phone].   Resources For Cancer Patients and Caregivers:  [employer]:  800-227-2345  Can help patients locate various types of support and financial assistance Cancer Care: 1-800-813-HOPE (4673) Provides financial assistance, online support groups, medication/co-pay assistance.   [place] DSS:  336-641-3447 Where to apply for food stamps, Medicaid, and utility assistance Medicare Rights Center: 800-333-4114 Helps people with Medicare understand their rights and benefits, navigate the Medicare system, and secure the  quality healthcare they deserve SCAT: 336-333-6589 [employer] [employer]'s shared-ride transportation service for eligible riders who have a disability that prevents them from riding the fixed route bus.   For additional information on assistance programs please contact our social worker:   Grier Hock/Abigail Elmore:  [phone] 

## 2017-04-01 NOTE — Progress Notes (Signed)
[name]    HEMATOLOGY/ONCOLOGY CLINIC NOTE  Date of Service: .04/01/2017  Patient Care Team: [name], MD as PCP - General (Family Medicine) [name] M.D. 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 KRAS mutated BRAF, NRAS mutation neg MSI Stable.  PET/CT scan done on 06/25/2016 Show multiple foci of hypermetabolic hepatic metastases (atleast 4-5) and 2 hypermetabolic peritoneal nodules along the dorsal peritoneal surface concerning for peritoneal metastases.  MRI Liver 07/27/2016 - Multiple small liver metastases throughout the right and left hepatic lobes, largest measuring 2.1 cm. 1.2 cm enhancing peritoneal nodule in left paracolic gutter, suspicious for peritoneal metastasis. Other small peritoneal nodules better visualized on recent PET-CT which showed hypermetabolic activity, also suspicious for peritoneal metastases.   CT CHEST WO CONTRAST 10/12/2016  [facility] Result Impression   1. Congenital variant of bilateral middle lobes. 2. Groundglass opacification with regions of bronchiectasis in the right lower lobe adjacent to the fissure, consistent with inflammatory/infectious changes. 3. No definitive evidence of metastatic disease to the chest. 4. Probable sebaceous cyst in the subcutaneous tissues beneath the left anterior chest.   MR ABDOMEN AND PELVIS ZOXWRUEA FOLLOW UP2/26/2018 [facility] Result Impression    Decreased size of multiple hepatic lesions and two peritoneal nodules compared to outside MRI 07/27/2016. No new lesions identified  in the abdomen or pelvis.     PREVIOUS TREATMENT  FOLFOX x 10 cycles. 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 (without 5FU bolus - due to thrombocytopenia). S/p Y-90 treatment to 1 hepatic lobe.  Plan for bilobar hepatic Y90 radio-embolization.  INTERVAL HISTORY  Patient is here for follow-up for his metastatic colon cancer. He has completed his initial planning for Y-90 radio-embolization at [facility]. They plan to do one liver lobe at the time. He notes he is feeling well with some improvement in his neuropathy. His blood sugars have been better. Good energy levels. Other than some mild thrombocytopenia has had no issues with maintenance 5-FU leucovorin chemotherapy. We discussed that we will plan to continue maintenance chemotherapy unless there are any liver function issues related to his radio embolization. He is having some neck pain from degenerative disc disease which is being controlled with tramadol when necessary. No fevers/chills/night sweats/weight loss.   MEDICAL HISTORY:  Past Medical History:  Diagnosis Date  . Anemia   . Arthritis   . Cancer ([place])   . Diabetes mellitus without complication (HCC)    diet controlled  . History of blood transfusion   . Hyperlipidemia   . Sleep apnea    cpap  . Wears glasses     SURGICAL HISTORY: Past Surgical History:  Procedure Laterality Date  . COLON SURGERY    . IR GENERIC HISTORICAL  09/24/2016   IR CV LINE INJECTION 09/24/2016 [facility]  . IR GENERIC HISTORICAL  09/24/2016   IR US GUIDE VASC ACCESS RIGHT 09/24/2016 [facility]  . IR GENERIC HISTORICAL  09/24/2016   IR FLUORO GUIDE CV LINE RIGHT 09/24/2016 [facility]  .  IR GENERIC HISTORICAL  09/30/2016   IR FLUORO GUIDE PORT INSERTION RIGHT 09/30/2016 Markus Daft, MD [facility]  . IR GENERIC HISTORICAL  09/30/2016    IR US GUIDE VASC ACCESS RIGHT 09/30/2016 Markus Daft, MD [facility]  . IR GENERIC HISTORICAL  09/30/2016   IR REMOVAL TUN ACCESS W/ PORT W/O FL MOD SED 09/30/2016 Markus Daft, MD [facility]  . LAPAROSCOPIC RIGHT HEMI COLECTOMY Right 06/17/2016   Procedure: LAPAROSCOPIC ASSISTED  RIGHT HEMI COLECTOMY;  Surgeon: [name], MD;  Location: [facility];  Service: General;  Laterality: Right;  . PORTACATH PLACEMENT Left 07/13/2016   Procedure: INSERTION PORT-A-CATH left subclavian;  Surgeon: [name], MD;  Location: [facility];  Service: General;  Laterality: Left;  . SHOULDER ACROMIOPLASTY Right 12/20/2014   Procedure: SHOULDER ACROMIOPLASTY;  Surgeon: Melrose Nakayama, MD;  Location: [facility];  Service: Orthopedics;  Laterality: Right;  . SHOULDER ARTHROSCOPY Right 12/20/2014   Procedure: RIGHT ARTHROSCOPY SHOULDER WITH DEBRIDEMENT;  Surgeon: Melrose Nakayama, MD;  Location: [facility];  Service: Orthopedics;  Laterality: Right;  . TESTICLE SURGERY     as teen  . TONSILLECTOMY    . WISDOM TOOTH EXTRACTION      SOCIAL HISTORY: Social History   Social History  . Marital status: Married    Spouse name: N/A  . Number of children: N/A  . Years of education: N/A   Occupational History  . cemetery maintenance    Social History Main Topics  . Smoking status: Former Smoker    Packs/day: 1.50    Years: 29.00    Types: Cigarettes    Quit date: 12/16/2012  . Smokeless tobacco: Never Used     Comment: 1-2 ppd from age [age]y to 2014  . Alcohol use 1.8 oz/week    3 Shots of liquor per week     Comment:  some days  . Drug use: No  . Sexual activity: Not on file   Other Topics Concern  . Not on file   Social History Narrative  . No narrative on file    FAMILY HISTORY: Family History  Problem Relation Age of Onset  . Diabetes Other   . Hyperlipidemia Other   . Hypertension Other   . Breast cancer Maternal Aunt 70  . Prostate cancer Maternal Uncle 75  . Lung  cancer Paternal Uncle 77  . Stroke Maternal Grandfather 72  . Cancer Paternal Grandmother 46       dx cancer of pancreas and colon, unknown if separate primaries  . Heart Problems Paternal Grandfather        d. 54  . Prostate cancer Maternal Uncle 87  . Breast cancer Maternal Aunt 75  . Breast cancer Maternal Aunt 68  . Cervical cancer Maternal Aunt 68  . Pancreatic cancer Maternal Aunt 54       d. 58y; heavy smoker  . Colon cancer Paternal Uncle 41       s/p partial colectomy; dx. second colon cancer at age 8-64    ALLERGIES:  is allergic to penicillins.  MEDICATIONS:  Current Outpatient Prescriptions  Medication Sig Dispense Refill  . dexamethasone (DECADRON) 4 MG tablet Take 2 tablets (8 mg total) by mouth daily. Start the day after chemotherapy for 2 days. Take with food. 30 tablet 0  . DULoxetine (CYMBALTA) 60 MG capsule Take 1 capsule (60 mg total) by mouth daily. 30 capsule 2  . lidocaine-prilocaine (EMLA) cream Apply to affected area once 30 g 3  .  LORazepam (ATIVAN) 0.5 MG tablet Take 1 tablet (0.5 mg total) by mouth every 8 (eight) hours. For anxiety or sleep 30 tablet 0  . Multiple Vitamin (MULTIVITAMIN) tablet Take 1 tablet by mouth daily.    . ondansetron (ZOFRAN) 8 MG tablet Take 1 tablet (8 mg total) by mouth 2 (two) times daily as needed for refractory nausea / vomiting. Start on day 3 after chemotherapy. 30 tablet 1  . Probiotic Product (PROBIOTIC PO) Take 1 tablet by mouth daily.    . prochlorperazine (COMPAZINE) 10 MG tablet Take 1 tablet (10 mg total) by mouth every 6 (six) hours as needed (Nausea or vomiting). 30 tablet 1  . traMADol (ULTRAM) 50 MG tablet Take 1 tablet (50 mg total) by mouth every 6 (six) hours as needed for moderate pain or severe pain. 60 tablet 0   No current facility-administered medications for this visit.    Facility-Administered Medications Ordered in Other Visits  Medication Dose Route Frequency Provider Last Rate Last Dose  .  fluorouracil (ADRUCIL) 5,800 mg in sodium chloride 0.9 % 134 mL chemo infusion  2,400 mg/m2 (Treatment Plan Recorded) Intravenous 1 day or 1 dose Brunetta Genera, MD   5,800 mg at 04/01/17 1446    REVIEW OF SYSTEMS:    10 Point review of Systems was done is negative except as noted above.  PHYSICAL EXAMINATION: ECOG PERFORMANCE STATUS: 1 - Symptomatic but completely ambulatory VS GENERAL:alert, in no acute distress and comfortable SKIN: skin color, texture, turgor are normal, no rashes or significant lesions EYES: normal, conjunctiva are pink and non-injected, sclera clear OROPHARYNX:no exudate, no erythema and lips, buccal mucosa, and tongue normal  NECK: supple, no JVD, thyroid normal size, non-tender, without nodularity LYMPH:  no palpable lymphadenopathy in the cervical, axillary or inguinal LUNGS: clear to auscultation with normal respiratory effort HEART: regular rate & rhythm,  no murmurs and no lower extremity edema ABDOMEN: abdomen soft, bowel sounds normoactive, healing laparoscopic surgical incisions with no discharge look clean. Musculoskeletal: no cyanosis of digits and no clubbing  PSYCH: alert & oriented x 3 with fluent speech NEURO: no focal motor/sensory deficits   LABORATORY DATA:  I have reviewed the data as listed  .[name] CBC Latest Ref Rng & Units 04/01/2017 03/18/2017 03/04/2017  WBC 4.0 - 10.3 10e3/uL 5.0 5.1 6.2  Hemoglobin 13.0 - 17.1 g/dL 14.6 14.3 15.3  Hematocrit 38.4 - 49.9 % 43.3 43.0 45.8  Platelets 140 - 400 10e3/uL 104(L) 94(L) 82(L)   . CMP Latest Ref Rng & Units 04/01/2017 03/18/2017 03/04/2017  Glucose 70 - 140 mg/dl 219(H) 223(H) 217(H)  BUN 7.0 - 26.0 mg/dL 8.9 7.5 10.3  Creatinine 0.7 - 1.3 mg/dL 0.9 0.8 0.9  Sodium 136 - 145 mEq/L 138 138 139  Potassium 3.5 - 5.1 mEq/L 3.9 4.1 4.0  Chloride 101 - 111 mmol/L - - -  CO2 22 - 29 mEq/L 25 26 25   Calcium 8.4 - 10.4 mg/dL 9.7 9.8 9.3  Total Protein 6.4 - 8.3 g/dL 7.0 6.9 7.0  Total Bilirubin  0.20 - 1.20 mg/dL 0.89 1.17 1.09  Alkaline Phos 40 - 150 U/L 117 117 95  AST 5 - 34 U/L 32 33 24  ALT 0 - 55 U/L 26 37 27        Microscopic Comment 2. COLON AND RECTUM (INCLUDING TRANS-ANAL RESECTION): Specimen: Terminal ileum, right colon and appendix Procedure: Segmental resection Tumor site: Proximal ascending colon Specimen integrity: Intact Macroscopic intactness of mesorectum: Not applicable: x Complete:  NA Near complete: NA Incomplete: NA Cannot be determined (specify): NA Macroscopic tumor perforation: The mass invades through muscularis propria into pericolonic soft tissue Invasive tumor: Maximum size: 5.5 cm Histologic type(s): Adenocarcinoma Histologic grade and differentiation: G3 G1: well differentiated/low grade 1 of 4 Supplemental copy SUPPLEMENTAL for Livsey, Gabriele ([id number]) Microscopic Comment(continued) G2: moderately differentiated/low grade G3: poorly differentiated/high grade G4: undifferentiated/high grade Type of polyp in which invasive carcinoma arose: Tubular adenoma Microscopic extension of invasive tumor: The mass invade through the muscularis propria into pericolonic soft tissue Lymph-Vascular invasion: Identified Peri-neural invasion: Identified Tumor deposit(s) (discontinuous extramural extension): Present Resection margins: Proximal margin: Negative Distal margin: Negative Circumferential (radial) (posterior ascending, posterior descending; lateral and posterior mid-rectum; and entire lower 1/3 rectum):Negative Mesenteric margin (sigmoid and transverse): NA Distance closest margin (if all above margins negative): 3.7 cm from the non peritonealized pericolic soft tissue margin Trans-anal resection margins only: Deep margin: NA Mucosal Margin: NA Distance closest mucosal margin (if negative): NA Treatment effect (neo-adjuvant therapy): NA Additional polyp(s): Negative Non-neoplastic findings: Unremarkable Lymph nodes: number  examined 18; number positive: 5 Pathologic Staging: pT3, N2a, M1a Ancillary studies: MSI ordered      RADIOGRAPHIC STUDIES: I have personally reviewed the radiological images as listed and agreed with the findings in the report. No results found. 2018 February MR ABDOMEN AND PELVIS WWOTUMOR FOLLOW UP2/26/2018 [facility] Result Impression    Decreased size of multiple hepatic lesions and two peritoneal nodules compared to outside MRI 07/27/2016. No new lesions identified in the abdomen or pelvis.  Result Narrative  MR ABDOMEN AND PELVIS WITH AND WITHOUT CONTRAST (TUMOR FOLLOW-UP PROTOCOL), 10/12/2016 5:59 PM   INDICATION: liver and peritoneal colon cancer metastases \ C18.9 Metastatic colon cancer to liver (HCC) \ C78.7 Metastatic colon cancer to liver ([place]) \ C78.6 Peritoneal carcinomatosis ([place]) \ C80.1 Peritoneal carcinomatosis ([place])   ADDITIONAL HISTORY: History of stage IV colorectal cancer status post laparoscopic right hemicolectomy on 06/17/2016. At time of surgery, tumor nodule in the right lobe of the liver was positive on biopsy for adenocarcinoma. Patient currently undergoing chemotherapy.  COMPARISON: Outside studies including CT chest abdomen pelvis 05/23/2016, PET CT 07/15/2016, and MR abdomen 07/27/2016   TECHNIQUE: Multiplanar, multisequence MR images of the abdomen and pelvis were obtained before and after intravenous administration of gadolinium-based contrast.   FINDINGS:  LOWER CHEST Heart: Normal size. No pericardial effusion. Lungs/pleura: No masses, consolidations, or effusions.  ABDOMEN Liver: Liver is enlarged, measuring 20 cm in greatest craniocaudal dimension. Hepatic steatosis. Multiple T2 hyperintense and T1 iso to hypointense lesions throughout the liver as marked in series 16, decreased in size compared to outside MR 07/27/2016. Largest lesion is in segment 8 and measures 1.4 cm and demonstrates peripheral enhancement. No  new lesion. Gallbladder: Normal. No stones or inflammatory changes. Biliary: No obstruction. Spleen: Spleen is mildly enlarged, measuring 13.8 cm in greatest length. Pancreas: Normal. Adrenals: Normal. Kidneys: Multiple T2 hyperintense nonenhancing lesions in the lower pole of the kidneys bilaterally, consistent with cysts. No hydronephrosis. Stomach/bowel: Right hemicolectomy. Peritoneum: Decreased size of peritoneal nodule in the left paracolic gutter measuring 6 mm (series 10, image 45). Decreased size of peritoneal nodule in the left lower quadrant along the ventral abdominal wall measuring 8 mm (series 23, image 13). No apparent new measurable lesions. Extraperitoneum: Within normal limits. Vascular: Within normal limits.  PELVIS Ureters: Within normal limits. Bladder: Within normal limits. Reproductive system: Within normal limits.  MSK: Lower midline ventral abdominal wall scar.   CT  CHEST WO CONTRAST2/26/2018 [facility] Result Impression   1. Congenital variant of bilateral middle lobes. 2. Groundglass opacification with regions of bronchiectasis in the right lower lobe adjacent to the fissure, consistent with inflammatory/infectious changes. 3. No definitive evidence of metastatic disease to the chest. 4. Probable sebaceous cyst in the subcutaneous tissues beneath the left anterior chest.  Result Narrative  CT CHEST WO CONTRAST, 10/12/2016 3:24 PM  INDICATION:stage IV colon cancer \ C18.9 Metastatic colon cancer to liver ([place]) \ C78.7 Metastatic colon cancer to liver ([place]) \ C78.6 Peritoneal carcinomatosis ([place]) \ C80.1 Peritoneal carcinomatosis ([place])  COMPARISON: None  TECHNIQUE: Multislice axial images were obtained through the chest without administration of iodinated intravenous contrast material. Multi-planar reformatted images were generated for additional analysis. Nongated technique limits cardiac detail.  All CT scans at [facility] are performed using dose optimization techniques as appropriate to a performed exam, including but not limited to one or more of the following: automated exposure control, adjustment of the mA and/or kV according to patient size, use of iterative reconstruction technique. In addition, [facility] is participating in the [place] program which will further assist [place] in optimizing patient radiation exposure.   FINDINGS:   Thoracic inlet/central airways: Parenchyma is within normal limits for age. The trachea is patent. Mediastinum/hila/axilla: Scattered, nonpathologically enlarged nodes. Heart/vessels: Normal heart size. Trace pericardial effusion. Coronary artery calcifications.. The tip of the central line is in the distal SVC. Lungs/pleura: There are bilateral middle lobes. Subtle groundglass opacification surrounding the posterior aspect of the right major fissure. This is seen in association with some bronchiectasis. Upper abdomen: See concurrent MRI study. Chest wall/MSK: Multi-segmented sternum. 1.5 cm low-density soft tissue nodule in the subcutaneous tissues beneath the left anterior chest.     MR LIVER WWO CONTRAST5/18/2018 [facility] Result Impression   1.When compared to the February 2018 MRI, there has been no interval change in the multiple T2 hyperintense lesions scattered throughout the liver compatible with metastases. When compared to the initial MRI performed December 2017, lesions have decreased in size. 2.No evidence of new hepatic or peritoneal lesions.    CT CHEST ABDOMEN PELVIS W CONTRAST (ROUTINE)01/01/2017 [facility] Result Impression   1. Most of the small hepatic lesions noted on prior MRI are not seen on today's CT, likely due to technical differences inherent to the modalities. The largest lesion noted in segment 8 on the prior examination  is unchanged in size. 2. Tiny implant along the upper left paracolic gutter, quite similar to the February 2018 MRI. No ascites or new peritoneal lesions are otherwise evident. 3. Status post right hemicolectomy. 4. Hepatic steatosis. 5. Additional ancillary findings as above.     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 KRAS mutated BRAF, NRAS mutation neg MSI Stable.  PET/CT scan done on 06/25/2016 Show multiple foci of hypermetabolic hepatic metastases (atleast 4-5) and 2 hypermetabolic peritoneal nodules along the dorsal peritoneal surface concerning for peritoneal metastases.  MRI Liver 07/27/2016 - Multiple small liver metastases throughout the right and left hepatic lobes, largest measuring 2.1 cm. 1.2 cm enhancing peritoneal nodule in left paracolic gutter, suspicious for peritoneal metastasis. Other small peritoneal nodules better visualized on recent PET-CT which showed hypermetabolic activity,  also suspicious for peritoneal metastases.   MRI Abd 10/12/2016 [facility] --shows improvement in all the liver and the 2 peritoneal lesions and no new lesions.  CEA level continues to improve.  MRI liver and CT C/A/P on 01/01/2017 as noted above showed improved/stable disease.  2) Mild thrombocytopenia PLT improvedfrom 84kto 104k (already have held 5FU bolus dose) . Platelets have remained stable. PLAN -no overt prohibitive toxicities from 5FU/leucovorin except mild thrombocytopenia -continue 5FU/LV without bolus -f/u with [facility] for serial hepatic Y90 radio-embolization for better control of hepatic metastatic disease. This will be decided based onrpt MRI abd at [facility] next month. (follows with Dr Pecola Lawless IR at [facility] for Y-90 treatments) -grade 1 neuropathy stable/imporved --continue Cymbalta 24m  po daily. -conitnue follow up with Dr. SMoreen Fowlerto optimize control of his diabetes. -continue maintenance 5FU/leucovorin q2weeks. Might need to switch to q3weeks if thrombocytopenia becomes limiting <75k or will cut down 5FU dose. -will continue maintenance 5FU/leucovorin for controlling peritoneal disease and may consider rad onc consultation for RT to residual peritoneal disease post liver directed therapies after completion of Y-90 hepatic embolization.   #2 . 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  Iron def resolved. 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 Chemotherapy-induced neuropathy ([place] 11/18/2016  . Genetic testing 10/30/2016  . Port catheter in place 09/25/2016  . Family history of colon cancer 07/24/2016  . Family history of prostate cancer 07/24/2016  . Metastatic colon cancer to liver ([place] 07/01/2016  . Right Colon cancer metastasized to liver ([place] 06/17/2016  . Chronic GI bleeding 05/29/2016  . Diabetes mellitus type 2, diet-controlled ([place] 05/22/2016  . OSA (obstructive sleep apnea) 05/22/2016  . Hyperlipidemia 05/22/2016  . Anemia due to blood loss, chronic   . Antineoplastic chemotherapy induced pancytopenia (CODE) ([place] 05/21/2016   -Continue follow-up with primary care physician .SAntony Contras MD for optimization of diabetes management . Might need basal insulin if his blood sugars are elevated with chemotherapy/ steroids . -Continue use of CPAP for sleep apnea .  continue 5FU/leucovorin maintenance chemotherapy q2weeks -- plz schedule next 3 months. -labs with each chemotherapy -RTC with Dr KIrene Limboin 4 weeks with  labs   All of the patients questions were answered with apparent satisfaction. The patient knows to call the clinic with any problems, questions or concerns.  I spent 20 minutes counseling the patient face to face. The total time spent in the appointment was 25 minutes and more than 50% was on counseling and direct patient cares.    GSullivan LoneMD MLassenAAHIVMS SSaint Joseph HospitalCUnited Medical Rehabilitation HospitalHematology/Oncology Physician CKurt G Vernon Md Pa (Office):       [phone](Work cell):  [phone](Fax):           [phone]

## 2017-04-01 NOTE — Telephone Encounter (Signed)
No additional appts added - appts already scheduled to the next md visit with one treatment after. 8/16

## 2017-04-03 ENCOUNTER — Ambulatory Visit (HOSPITAL_BASED_OUTPATIENT_CLINIC_OR_DEPARTMENT_OTHER): Payer: Self-pay

## 2017-04-03 VITALS — BP 125/87 | HR 76 | Temp 98.5°F | Resp 17

## 2017-04-03 DIAGNOSIS — C189 Malignant neoplasm of colon, unspecified: Secondary | ICD-10-CM

## 2017-04-03 DIAGNOSIS — C787 Secondary malignant neoplasm of liver and intrahepatic bile duct: Secondary | ICD-10-CM

## 2017-04-03 MED ORDER — HEPARIN SOD (PORK) LOCK FLUSH 100 UNIT/ML IV SOLN
500.0000 [IU] | Freq: Once | INTRAVENOUS | Status: AC | PRN
  Administered 2017-04-03: 500 [IU]
  Filled 2017-04-03: qty 5

## 2017-04-03 MED ORDER — SODIUM CHLORIDE 0.9% FLUSH
10.0000 mL | INTRAVENOUS | Status: DC | PRN
  Administered 2017-04-03: 10 mL
  Filled 2017-04-03: qty 10

## 2017-04-05 ENCOUNTER — Other Ambulatory Visit: Payer: Self-pay | Admitting: *Deleted

## 2017-04-05 DIAGNOSIS — C787 Secondary malignant neoplasm of liver and intrahepatic bile duct: Principal | ICD-10-CM

## 2017-04-05 DIAGNOSIS — C189 Malignant neoplasm of colon, unspecified: Secondary | ICD-10-CM

## 2017-04-05 MED ORDER — PROCHLORPERAZINE MALEATE 10 MG PO TABS: 10.0000 mg | ORAL_TABLET | Freq: Four times a day (QID) | ORAL | 1 refills | Status: DC | PRN

## 2017-04-14 DIAGNOSIS — C189 Malignant neoplasm of colon, unspecified: Secondary | ICD-10-CM | POA: Diagnosis not present

## 2017-04-15 ENCOUNTER — Ambulatory Visit (HOSPITAL_BASED_OUTPATIENT_CLINIC_OR_DEPARTMENT_OTHER): Payer: 59

## 2017-04-15 ENCOUNTER — Ambulatory Visit: Payer: 59

## 2017-04-15 ENCOUNTER — Other Ambulatory Visit (HOSPITAL_BASED_OUTPATIENT_CLINIC_OR_DEPARTMENT_OTHER): Payer: 59

## 2017-04-15 VITALS — BP 123/70 | HR 81 | Temp 98.4°F | Resp 18

## 2017-04-15 DIAGNOSIS — Z5111 Encounter for antineoplastic chemotherapy: Secondary | ICD-10-CM

## 2017-04-15 DIAGNOSIS — Z95828 Presence of other vascular implants and grafts: Secondary | ICD-10-CM

## 2017-04-15 DIAGNOSIS — C182 Malignant neoplasm of ascending colon: Secondary | ICD-10-CM | POA: Diagnosis not present

## 2017-04-15 DIAGNOSIS — C787 Secondary malignant neoplasm of liver and intrahepatic bile duct: Secondary | ICD-10-CM

## 2017-04-15 DIAGNOSIS — D696 Thrombocytopenia, unspecified: Secondary | ICD-10-CM

## 2017-04-15 DIAGNOSIS — C189 Malignant neoplasm of colon, unspecified: Secondary | ICD-10-CM

## 2017-04-15 DIAGNOSIS — D500 Iron deficiency anemia secondary to blood loss (chronic): Secondary | ICD-10-CM

## 2017-04-15 DIAGNOSIS — C786 Secondary malignant neoplasm of retroperitoneum and peritoneum: Secondary | ICD-10-CM

## 2017-04-15 LAB — COMPREHENSIVE METABOLIC PANEL
ALT: 38 U/L (ref 0–55)
ANION GAP: 9 meq/L (ref 3–11)
AST: 35 U/L — AB (ref 5–34)
Albumin: 3.7 g/dL (ref 3.5–5.0)
Alkaline Phosphatase: 143 U/L (ref 40–150)
BUN: 9 mg/dL (ref 7.0–26.0)
CALCIUM: 9.6 mg/dL (ref 8.4–10.4)
CO2: 26 mEq/L (ref 22–29)
Chloride: 104 mEq/L (ref 98–109)
Creatinine: 0.9 mg/dL (ref 0.7–1.3)
EGFR: >90 mL/min/{1.73_m2} (ref >90)
Glucose: 219 mg/dl — ABNORMAL HIGH (ref 70–140)
POTASSIUM: 4.3 meq/L (ref 3.5–5.1)
Sodium: 139 mEq/L (ref 136–145)
Total Bilirubin: 1 mg/dL (ref 0.20–1.20)
Total Protein: 7.2 g/dL (ref 6.4–8.3)

## 2017-04-15 LAB — CBC & DIFF AND RETIC
BASO%: 0.6 % (ref 0.0–2.0)
BASOS ABS: 0 10*3/uL (ref 0.0–0.1)
EOS ABS: 0.1 10*3/uL (ref 0.0–0.5)
EOS%: 2.4 % (ref 0.0–7.0)
HEMATOCRIT: 44.2 % (ref 38.4–49.9)
HEMOGLOBIN: 14.5 g/dL (ref 13.0–17.1)
Immature Retic Fract: 8.2 % (ref 3.00–10.60)
LYMPH%: 13.5 % — AB (ref 14.0–49.0)
MCH: 31.8 pg (ref 27.2–33.4)
MCHC: 32.8 g/dL (ref 32.0–36.0)
MCV: 96.9 fL (ref 79.3–98.0)
MONO#: 0.5 10*3/uL (ref 0.1–0.9)
MONO%: 10.6 % (ref 0.0–14.0)
NEUT#: 3.6 10*3/uL (ref 1.5–6.5)
NEUT%: 72.9 % (ref 39.0–75.0)
PLATELETS: 96 10*3/uL — AB (ref 140–400)
RBC: 4.56 10*6/uL (ref 4.20–5.82)
RDW: 15.3 % — AB (ref 11.0–14.6)
Retic %: 2.11 % — ABNORMAL HIGH (ref 0.80–1.80)
Retic Ct Abs: 96.22 10*3/uL — ABNORMAL HIGH (ref 34.80–93.90)
WBC: 5 10*3/uL (ref 4.0–10.3)
lymph#: 0.7 10*3/uL — ABNORMAL LOW (ref 0.9–3.3)

## 2017-04-15 MED ORDER — PALONOSETRON HCL INJECTION 0.25 MG/5ML
0.2500 mg | Freq: Once | INTRAVENOUS | Status: AC
  Administered 2017-04-15: 0.25 mg via INTRAVENOUS

## 2017-04-15 MED ORDER — DEXTROSE 5 % IV SOLN
Freq: Once | INTRAVENOUS | Status: AC
  Administered 2017-04-15: 09:00:00 via INTRAVENOUS

## 2017-04-15 MED ORDER — SODIUM CHLORIDE 0.9 % IV SOLN
2400.0000 mg/m2 | INTRAVENOUS | Status: DC
  Administered 2017-04-15: 5800 mg via INTRAVENOUS
  Filled 2017-04-15: qty 116

## 2017-04-15 MED ORDER — LEUCOVORIN CALCIUM INJECTION 350 MG
400.0000 mg/m2 | Freq: Once | INTRAMUSCULAR | Status: AC
  Administered 2017-04-15: 964 mg via INTRAVENOUS
  Filled 2017-04-15: qty 48.2

## 2017-04-15 MED ORDER — PALONOSETRON HCL INJECTION 0.25 MG/5ML
INTRAVENOUS | Status: AC
  Filled 2017-04-15: qty 5

## 2017-04-15 MED ORDER — DEXAMETHASONE SODIUM PHOSPHATE 10 MG/ML IJ SOLN
10.0000 mg | Freq: Once | INTRAMUSCULAR | Status: AC
  Administered 2017-04-15: 10 mg via INTRAVENOUS

## 2017-04-15 MED ORDER — DEXAMETHASONE SODIUM PHOSPHATE 10 MG/ML IJ SOLN
INTRAMUSCULAR | Status: AC
  Filled 2017-04-15: qty 1

## 2017-04-15 MED ORDER — SODIUM CHLORIDE 0.9% FLUSH
10.0000 mL | INTRAVENOUS | Status: DC | PRN
  Administered 2017-04-15: 10 mL via INTRAVENOUS
  Filled 2017-04-15: qty 10

## 2017-04-15 NOTE — Patient Instructions (Signed)
[facility] Discharge Instructions for Patients Receiving Chemotherapy  Today you received the following chemotherapy agents Leucovorin/Adrucil  To help prevent nausea and vomiting after your treatment, we encourage you to take your nausea medication    If you develop nausea and vomiting that is not controlled by your nausea medication, call the clinic.   BELOW ARE SYMPTOMS THAT SHOULD BE REPORTED IMMEDIATELY:  *FEVER GREATER THAN 100.5 F  *CHILLS WITH OR WITHOUT FEVER  NAUSEA AND VOMITING THAT IS NOT CONTROLLED WITH YOUR NAUSEA MEDICATION  *UNUSUAL SHORTNESS OF BREATH  *UNUSUAL BRUISING OR BLEEDING  TENDERNESS IN MOUTH AND THROAT WITH OR WITHOUT PRESENCE OF ULCERS  *URINARY PROBLEMS  *BOWEL PROBLEMS  UNUSUAL RASH Items with * indicate a potential emergency and should be followed up as soon as possible.  Feel free to call the clinic you have any questions or concerns. The clinic phone number is [phone].  Please show the CHEMO ALERT CARD at check-in to the Emergency Department and triage nurse.   

## 2017-04-15 NOTE — Progress Notes (Signed)
Ok to treat with platelet count of 96.

## 2017-04-15 NOTE — Progress Notes (Signed)
Pt has an appt with PCP r/t diabetes and better blood sugar control.

## 2017-04-17 ENCOUNTER — Ambulatory Visit (HOSPITAL_BASED_OUTPATIENT_CLINIC_OR_DEPARTMENT_OTHER): Payer: Self-pay

## 2017-04-17 ENCOUNTER — Other Ambulatory Visit: Payer: Self-pay

## 2017-04-17 VITALS — BP 144/75 | HR 85 | Temp 98.4°F

## 2017-04-17 DIAGNOSIS — C787 Secondary malignant neoplasm of liver and intrahepatic bile duct: Secondary | ICD-10-CM

## 2017-04-17 DIAGNOSIS — C189 Malignant neoplasm of colon, unspecified: Secondary | ICD-10-CM

## 2017-04-17 MED ORDER — SODIUM CHLORIDE 0.9% FLUSH
10.0000 mL | INTRAVENOUS | Status: DC | PRN
  Administered 2017-04-17: 10 mL
  Filled 2017-04-17: qty 10

## 2017-04-17 MED ORDER — HEPARIN SOD (PORK) LOCK FLUSH 100 UNIT/ML IV SOLN
500.0000 [IU] | Freq: Once | INTRAVENOUS | Status: AC | PRN
  Administered 2017-04-17: 500 [IU]
  Filled 2017-04-17: qty 5

## 2017-04-21 DIAGNOSIS — G4733 Obstructive sleep apnea (adult) (pediatric): Secondary | ICD-10-CM | POA: Diagnosis not present

## 2017-04-27 ENCOUNTER — Other Ambulatory Visit: Payer: Self-pay | Admitting: Hematology

## 2017-04-27 ENCOUNTER — Other Ambulatory Visit: Payer: Self-pay

## 2017-04-27 MED ORDER — DULOXETINE HCL 60 MG PO CPEP: 60.0000 mg | ORAL_CAPSULE | Freq: Every day | ORAL | 1 refills | Status: DC

## 2017-04-29 ENCOUNTER — Other Ambulatory Visit (HOSPITAL_BASED_OUTPATIENT_CLINIC_OR_DEPARTMENT_OTHER): Payer: 59

## 2017-04-29 ENCOUNTER — Telehealth: Payer: Self-pay | Admitting: Hematology

## 2017-04-29 ENCOUNTER — Ambulatory Visit: Payer: 59

## 2017-04-29 ENCOUNTER — Ambulatory Visit (HOSPITAL_BASED_OUTPATIENT_CLINIC_OR_DEPARTMENT_OTHER): Payer: 59

## 2017-04-29 ENCOUNTER — Encounter: Payer: Self-pay | Admitting: Hematology

## 2017-04-29 ENCOUNTER — Ambulatory Visit (HOSPITAL_BASED_OUTPATIENT_CLINIC_OR_DEPARTMENT_OTHER): Payer: 59 | Admitting: Hematology

## 2017-04-29 VITALS — BP 125/79 | HR 80 | Temp 98.2°F | Resp 18 | Ht 74.0 in | Wt 250.7 lb

## 2017-04-29 DIAGNOSIS — C787 Secondary malignant neoplasm of liver and intrahepatic bile duct: Secondary | ICD-10-CM

## 2017-04-29 DIAGNOSIS — C189 Malignant neoplasm of colon, unspecified: Secondary | ICD-10-CM | POA: Diagnosis not present

## 2017-04-29 DIAGNOSIS — C786 Secondary malignant neoplasm of retroperitoneum and peritoneum: Secondary | ICD-10-CM

## 2017-04-29 DIAGNOSIS — H8110 Benign paroxysmal vertigo, unspecified ear: Secondary | ICD-10-CM | POA: Diagnosis not present

## 2017-04-29 DIAGNOSIS — K922 Gastrointestinal hemorrhage, unspecified: Secondary | ICD-10-CM | POA: Diagnosis not present

## 2017-04-29 DIAGNOSIS — Z95828 Presence of other vascular implants and grafts: Secondary | ICD-10-CM

## 2017-04-29 DIAGNOSIS — D500 Iron deficiency anemia secondary to blood loss (chronic): Secondary | ICD-10-CM

## 2017-04-29 DIAGNOSIS — C182 Malignant neoplasm of ascending colon: Secondary | ICD-10-CM | POA: Diagnosis not present

## 2017-04-29 DIAGNOSIS — Z5111 Encounter for antineoplastic chemotherapy: Secondary | ICD-10-CM

## 2017-04-29 DIAGNOSIS — D696 Thrombocytopenia, unspecified: Secondary | ICD-10-CM

## 2017-04-29 LAB — CBC & DIFF AND RETIC
BASO%: 0.7 % (ref 0.0–2.0)
Basophils Absolute: 0 10*3/uL (ref 0.0–0.1)
EOS ABS: 0.1 10*3/uL (ref 0.0–0.5)
EOS%: 2.2 % (ref 0.0–7.0)
HCT: 44.2 % (ref 38.4–49.9)
HEMOGLOBIN: 14.5 g/dL (ref 13.0–17.1)
IMMATURE RETIC FRACT: 10.6 % (ref 3.00–10.60)
LYMPH#: 0.7 10*3/uL — AB (ref 0.9–3.3)
LYMPH%: 14.7 % (ref 14.0–49.0)
MCH: 31.9 pg (ref 27.2–33.4)
MCHC: 32.8 g/dL (ref 32.0–36.0)
MCV: 97.4 fL (ref 79.3–98.0)
MONO#: 0.6 10*3/uL (ref 0.1–0.9)
MONO%: 12.5 % (ref 0.0–14.0)
NEUT%: 69.9 % (ref 39.0–75.0)
NEUTROS ABS: 3.2 10*3/uL (ref 1.5–6.5)
Platelets: 95 10*3/uL — ABNORMAL LOW (ref 140–400)
RBC: 4.54 10*6/uL (ref 4.20–5.82)
RDW: 15.4 % — ABNORMAL HIGH (ref 11.0–14.6)
RETIC %: 3 % — AB (ref 0.80–1.80)
RETIC CT ABS: 136.2 10*3/uL — AB (ref 34.80–93.90)
WBC: 4.6 10*3/uL (ref 4.0–10.3)

## 2017-04-29 LAB — COMPREHENSIVE METABOLIC PANEL
ALBUMIN: 3.8 g/dL (ref 3.5–5.0)
ALT: 39 U/L (ref 0–55)
AST: 36 U/L — AB (ref 5–34)
Alkaline Phosphatase: 147 U/L (ref 40–150)
Anion Gap: 12 mEq/L — ABNORMAL HIGH (ref 3–11)
BILIRUBIN TOTAL: 0.81 mg/dL (ref 0.20–1.20)
BUN: 8.7 mg/dL (ref 7.0–26.0)
CO2: 23 meq/L (ref 22–29)
Calcium: 9.8 mg/dL (ref 8.4–10.4)
Chloride: 106 mEq/L (ref 98–109)
Creatinine: 0.9 mg/dL (ref 0.7–1.3)
GLUCOSE: 195 mg/dL — AB (ref 70–140)
POTASSIUM: 4.2 meq/L (ref 3.5–5.1)
Sodium: 141 mEq/L (ref 136–145)
Total Protein: 7.1 g/dL (ref 6.4–8.3)

## 2017-04-29 MED ORDER — LEUCOVORIN CALCIUM INJECTION 350 MG
400.0000 mg/m2 | Freq: Once | INTRAMUSCULAR | Status: AC
  Administered 2017-04-29: 964 mg via INTRAVENOUS
  Filled 2017-04-29: qty 48.2

## 2017-04-29 MED ORDER — SODIUM CHLORIDE 0.9 % IV SOLN
2400.0000 mg/m2 | INTRAVENOUS | Status: DC
  Administered 2017-04-29: 5800 mg via INTRAVENOUS
  Filled 2017-04-29: qty 116

## 2017-04-29 MED ORDER — SODIUM CHLORIDE 0.9% FLUSH
10.0000 mL | INTRAVENOUS | Status: DC | PRN
  Administered 2017-04-29: 10 mL via INTRAVENOUS
  Filled 2017-04-29: qty 10

## 2017-04-29 MED ORDER — SODIUM CHLORIDE 0.9 % IV SOLN
INTRAVENOUS | Status: DC
  Administered 2017-04-29: 11:00:00 via INTRAVENOUS

## 2017-04-29 MED ORDER — PALONOSETRON HCL INJECTION 0.25 MG/5ML
0.2500 mg | Freq: Once | INTRAVENOUS | Status: AC
  Administered 2017-04-29: 0.25 mg via INTRAVENOUS

## 2017-04-29 MED ORDER — DEXAMETHASONE SODIUM PHOSPHATE 10 MG/ML IJ SOLN
10.0000 mg | Freq: Once | INTRAMUSCULAR | Status: AC
Start: 2017-04-29 — End: 2017-04-29
  Administered 2017-04-29: 10 mg via INTRAVENOUS

## 2017-04-29 MED ORDER — PALONOSETRON HCL INJECTION 0.25 MG/5ML
INTRAVENOUS | Status: AC
  Filled 2017-04-29: qty 5

## 2017-04-29 MED ORDER — DEXAMETHASONE SODIUM PHOSPHATE 10 MG/ML IJ SOLN
INTRAMUSCULAR | Status: AC
Start: 2017-04-29 — End: 2017-04-29
  Filled 2017-04-29: qty 1

## 2017-04-29 NOTE — Progress Notes (Signed)
Ok to treat today with platelets of 95K  VOgiven and read back Dr. Irene Limbo via Judeen Hammans RN

## 2017-04-29 NOTE — Patient Instructions (Signed)
[facility] Discharge Instructions for Patients Receiving Chemotherapy  Today you received the following chemotherapy agents Leucovorin/Adrucil  To help prevent nausea and vomiting after your treatment, we encourage you to take your nausea medication    If you develop nausea and vomiting that is not controlled by your nausea medication, call the clinic.   BELOW ARE SYMPTOMS THAT SHOULD BE REPORTED IMMEDIATELY:  *FEVER GREATER THAN 100.5 F  *CHILLS WITH OR WITHOUT FEVER  NAUSEA AND VOMITING THAT IS NOT CONTROLLED WITH YOUR NAUSEA MEDICATION  *UNUSUAL SHORTNESS OF BREATH  *UNUSUAL BRUISING OR BLEEDING  TENDERNESS IN MOUTH AND THROAT WITH OR WITHOUT PRESENCE OF ULCERS  *URINARY PROBLEMS  *BOWEL PROBLEMS  UNUSUAL RASH Items with * indicate a potential emergency and should be followed up as soon as possible.  Feel free to call the clinic you have any questions or concerns. The clinic phone number is [phone].  Please show the CHEMO ALERT CARD at check-in to the Emergency Department and triage nurse.   

## 2017-04-29 NOTE — Progress Notes (Signed)
[name]    HEMATOLOGY/ONCOLOGY CLINIC NOTE  Date of Service: 04/29/17  Patient Care Team: [name], MD as PCP - General (Family Medicine) [name] M.D. 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 KRAS mutated BRAF, NRAS mutation neg MSI Stable.  PET/CT scan done on 06/25/2016 Show multiple foci of hypermetabolic hepatic metastases (atleast 4-5) and 2 hypermetabolic peritoneal nodules along the dorsal peritoneal surface concerning for peritoneal metastases.  MRI Liver 07/27/2016 - Multiple small liver metastases throughout the right and left hepatic lobes, largest measuring 2.1 cm. 1.2 cm enhancing peritoneal nodule in left paracolic gutter, suspicious for peritoneal metastasis. Other small peritoneal nodules better visualized on recent PET-CT which showed hypermetabolic activity, also suspicious for peritoneal metastases.   CT CHEST WO CONTRAST 10/12/2016  [facility] Result Impression   1. Congenital variant of bilateral middle lobes. 2. Groundglass opacification with regions of bronchiectasis in the right lower lobe adjacent to the fissure, consistent with inflammatory/infectious changes. 3. No definitive evidence of metastatic disease to the chest. 4. Probable sebaceous cyst in the subcutaneous tissues beneath the left anterior chest.   MR ABDOMEN AND PELVIS [id number] FOLLOW UP2/26/2018 [facility] Result Impression    Decreased size of multiple hepatic lesions and two peritoneal nodules compared to outside MRI 07/27/2016. No new lesions identified in  the abdomen or pelvis.     PREVIOUS TREATMENT  FOLFOX x 10 cycles. 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 (without 5FU bolus - due to thrombocytopenia). S/p Y-90 treatment to 1 hepatic lobe.  Plan for bilobar hepatic Y90 radio-embolization.  INTERVAL HISTORY  Patient is here for follow-up for his metastatic colon cancer. Overall, he is feeling well. He reports that he recently tripped and fell ~3-69f off of a trailer while working and at the time landed on into a sitting position and possibly fractured his coccyx. He also notes that he struck the back of his head on the asphalt below him. He denies LOC, numbness, tingling, weakness to the lower extremities. He reports that he has had persistent lower back pain since his fall, but this has been improving and he denies any open wounds over the back. Otherwise he is feeling well. He states that his CBG has been steady recently and he will have his A1C checked next month. He denies any fever, chills, night sweats, weight loss. His neuropathy has overall remained the same, but he reports that he is getting used to this. He is unsure if his fall is related to his neuropathy; however, he believes his foot only slipped. He denies any dizziness during this event; however, he does still experience some room-spinning dizziness with laying down. He does note that he has some sensation of fluid within his ears, he denies any overt pain to the ear. He has been remaining active at work and in his daily life, he does not some mild fatigue. He has been using his CPAP regularly; however, his wife notes some intermittent non-compliance.   MEDICAL HISTORY:  Past Medical  History:  Diagnosis Date  . Anemia   . Arthritis   . Cancer (HCC)   . Diabetes mellitus without complication (HCC)    diet controlled  . History of  blood transfusion   . Hyperlipidemia   . Sleep apnea    cpap  . Wears glasses     SURGICAL HISTORY: Past Surgical History:  Procedure Laterality Date  . COLON SURGERY    . IR GENERIC HISTORICAL  09/24/2016   IR CV LINE INJECTION 09/24/2016 [facility]  . IR GENERIC HISTORICAL  09/24/2016   IR US GUIDE VASC ACCESS RIGHT 09/24/2016 [facility]  . IR GENERIC HISTORICAL  09/24/2016   IR FLUORO GUIDE CV LINE RIGHT 09/24/2016 [facility]  . IR GENERIC HISTORICAL  09/30/2016   IR FLUORO GUIDE PORT INSERTION RIGHT 09/30/2016 Adam Henn, MD [facility]  . IR GENERIC HISTORICAL  09/30/2016   IR US GUIDE VASC ACCESS RIGHT 09/30/2016 Adam Henn, MD [facility]  . IR GENERIC HISTORICAL  09/30/2016   IR REMOVAL TUN ACCESS W/ PORT W/O FL MOD SED 09/30/2016 Adam Henn, MD [facility]  . LAPAROSCOPIC RIGHT HEMI COLECTOMY Right 06/17/2016   Procedure: LAPAROSCOPIC ASSISTED  RIGHT HEMI COLECTOMY;  Surgeon: Matthew Martin, MD;  Location: [facility];  Service: General;  Laterality: Right;  . PORTACATH PLACEMENT Left 07/13/2016   Procedure: INSERTION PORT-A-CATH left subclavian;  Surgeon: Matthew Martin, MD;  Location: [facility];  Service: General;  Laterality: Left;  . SHOULDER ACROMIOPLASTY Right 12/20/2014   Procedure: SHOULDER ACROMIOPLASTY;  Surgeon: Peter Dalldorf, MD;  Location: [facility];  Service: Orthopedics;  Laterality: Right;  . SHOULDER ARTHROSCOPY Right 12/20/2014   Procedure: RIGHT ARTHROSCOPY SHOULDER WITH DEBRIDEMENT;  Surgeon: Peter Dalldorf, MD;  Location: [facility];  Service: Orthopedics;  Laterality: Right;  . TESTICLE SURGERY     as teen  . TONSILLECTOMY    . WISDOM TOOTH EXTRACTION      SOCIAL HISTORY: Social History   Social History  . Marital status: Married    Spouse name: N/A  . Number of children: N/A  . Years of education: N/A   Occupational History  . cemetery maintenance    Social History Main Topics  . Smoking status: Former Smoker     Packs/day: 1.50    Years: 29.00    Types: Cigarettes    Quit date: 12/16/2012  . Smokeless tobacco: Never Used     Comment: 1-2 ppd from age [age]y to 2014  . Alcohol use 1.8 oz/week    3 Shots of liquor per week     Comment:  some days  . Drug use: No  . Sexual activity: Not on file   Other Topics Concern  . Not on file   Social History Narrative  . No narrative on file    FAMILY HISTORY: Family History  Problem Relation Age of Onset  . Diabetes Other   . Hyperlipidemia Other   . Hypertension Other   . Breast cancer Maternal Aunt 70  . Prostate cancer Maternal Uncle 75  . Lung cancer Paternal Uncle 63  . Stroke Maternal Grandfather 72  . Cancer Paternal Grandmother 68       dx cancer of pancreas and colon, unknown if separate primaries  . Heart Problems Paternal Grandfather        d. 75  . Prostate cancer Maternal Uncle 55  . Breast cancer Maternal Aunt 75  . Breast cancer Maternal Aunt 68  . Cervical cancer   Maternal Aunt 68  . Pancreatic cancer Maternal Aunt 54       d. 58y; heavy smoker  . Colon cancer Paternal Uncle 54       s/p partial colectomy; dx. second colon cancer at age 59-64    ALLERGIES:  is allergic to penicillins.  MEDICATIONS:  Current Outpatient Prescriptions  Medication Sig Dispense Refill  . dexamethasone (DECADRON) 4 MG tablet Take 2 tablets (8 mg total) by mouth daily. Start the day after chemotherapy for 2 days. Take with food. 30 tablet 0  . DULoxetine (CYMBALTA) 60 MG capsule Take 1 capsule (60 mg total) by mouth daily. 30 capsule 1  . lidocaine-prilocaine (EMLA) cream Apply to affected area once 30 g 3  . LORazepam (ATIVAN) 0.5 MG tablet Take 1 tablet (0.5 mg total) by mouth every 8 (eight) hours. For anxiety or sleep 30 tablet 0  . Multiple Vitamin (MULTIVITAMIN) tablet Take 1 tablet by mouth daily.    . ondansetron (ZOFRAN) 8 MG tablet Take 1 tablet (8 mg total) by mouth 2 (two) times daily as needed for refractory nausea / vomiting.  Start on day 3 after chemotherapy. 30 tablet 1  . Probiotic Product (PROBIOTIC PO) Take 1 tablet by mouth daily.    . prochlorperazine (COMPAZINE) 10 MG tablet Take 1 tablet (10 mg total) by mouth every 6 (six) hours as needed (Nausea or vomiting). 30 tablet 1  . traMADol (ULTRAM) 50 MG tablet Take 1 tablet (50 mg total) by mouth every 6 (six) hours as needed for moderate pain or severe pain. 60 tablet 0   No current facility-administered medications for this visit.     REVIEW OF SYSTEMS:    10 Point review of Systems was done is negative except as noted above.  PHYSICAL EXAMINATION: ECOG PERFORMANCE STATUS: 1 - Symptomatic but completely ambulatory VS GENERAL:alert, in no acute distress and comfortable SKIN: skin color, texture, turgor are normal, no rashes or significant lesions EYES: normal, conjunctiva are pink and non-injected, sclera clear OROPHARYNX:no exudate, no erythema and lips, buccal mucosa, and tongue normal  NECK: supple, no JVD, thyroid normal size, non-tender, without nodularity LYMPH:  no palpable lymphadenopathy in the cervical, axillary or inguinal LUNGS: clear to auscultation with normal respiratory effort HEART: regular rate & rhythm,  no murmurs and no lower extremity edema ABDOMEN: abdomen soft, bowel sounds normoactive, healing laparoscopic surgical incisions with no discharge look clean. Musculoskeletal: no cyanosis of digits and no clubbing  PSYCH: alert & oriented x 3 with fluent speech NEURO: no focal motor/sensory deficits   LABORATORY DATA:  I have reviewed the data as listed  .. CBC Latest Ref Rng & Units 04/29/2017 04/15/2017 04/01/2017  WBC 4.0 - 10.3 10e3/uL 4.6 5.0 5.0  Hemoglobin 13.0 - 17.1 g/dL 14.5 14.5 14.6  Hematocrit 38.4 - 49.9 % 44.2 44.2 43.3  Platelets 140 - 400 10e3/uL 95(L) 96(L) 104(L)   . CMP Latest Ref Rng & Units 04/29/2017 04/15/2017 04/01/2017  Glucose 70 - 140 mg/dl 195(H) 219(H) 219(H)  BUN 7.0 - 26.0 mg/dL 8.7 9.0 8.9    Creatinine 0.7 - 1.3 mg/dL 0.9 0.9 0.9  Sodium 136 - 145 mEq/L 141 139 138  Potassium 3.5 - 5.1 mEq/L 4.2 4.3 3.9  Chloride 101 - 111 mmol/L - - -  CO2 22 - 29 mEq/L 23 26 25  Calcium 8.4 - 10.4 mg/dL 9.8 9.6 9.7  Total Protein 6.4 - 8.3 g/dL 7.1 7.2 7.0  Total Bilirubin 0.20 - 1.20 mg/dL 0.81   1.00 0.89  Alkaline Phos 40 - 150 U/L 147 143 117  AST 5 - 34 U/L 36(H) 35(H) 32  ALT 0 - 55 U/L 39 38 26        Microscopic Comment 2. COLON AND RECTUM (INCLUDING TRANS-ANAL RESECTION): Specimen: Terminal ileum, right colon and appendix Procedure: Segmental resection Tumor site: Proximal ascending colon Specimen integrity: Intact Macroscopic intactness of mesorectum: Not applicable: x Complete: NA Near complete: NA Incomplete: NA Cannot be determined (specify): NA Macroscopic tumor perforation: The mass invades through muscularis propria into pericolonic soft tissue Invasive tumor: Maximum size: 5.5 cm Histologic type(s): Adenocarcinoma Histologic grade and differentiation: G3 G1: well differentiated/low grade 1 of 4 Supplemental copy SUPPLEMENTAL for [place], [place]one ([id number]) Microscopic Comment(continued) G2: moderately differentiated/low grade G3: poorly differentiated/high grade G4: undifferentiated/high grade Type of polyp in which invasive carcinoma arose: Tubular adenoma Microscopic extension of invasive tumor: The mass invade through the muscularis propria into pericolonic soft tissue Lymph-Vascular invasion: Identified Peri-neural invasion: Identified Tumor deposit(s) (discontinuous extramural extension): Present Resection margins: Proximal margin: Negative Distal margin: Negative Circumferential (radial) (posterior ascending, posterior descending; lateral and posterior mid-rectum; and entire lower 1/3 rectum):Negative Mesenteric margin (sigmoid and transverse): NA Distance closest margin (if all above margins negative): 3.7 cm from the non peritonealized  pericolic soft tissue margin Trans-anal resection margins only: Deep margin: NA Mucosal Margin: NA Distance closest mucosal margin (if negative): NA Treatment effect (neo-adjuvant therapy): NA Additional polyp(s): Negative Non-neoplastic findings: Unremarkable Lymph nodes: number examined 18; number positive: 5 Pathologic Staging: pT3, N2a, M1a Ancillary studies: MSI ordered      RADIOGRAPHIC STUDIES: I have personally reviewed the radiological images as listed and agreed with the findings in the report. No results found. 2018 February MR ABDOMEN AND PELVIS WWOTUMOR FOLLOW UP2/26/2018 [facility] Result Impression    Decreased size of multiple hepatic lesions and two peritoneal nodules compared to outside MRI 07/27/2016. No new lesions identified in the abdomen or pelvis.  Result Narrative  MR ABDOMEN AND PELVIS WITH AND WITHOUT CONTRAST (TUMOR FOLLOW-UP PROTOCOL), 10/12/2016 5:59 PM   INDICATION: liver and peritoneal colon cancer metastases _0 C18.9 Metastatic colon cancer to liver (HCC) _1 C78.7 Metastatic colon cancer to liver (HCC) _2 C78.6 Peritoneal carcinomatosis ([place]) _3 C80.1 Peritoneal carcinomatosis ([place])   ADDITIONAL HISTORY: History of stage IV colorectal cancer status post laparoscopic right hemicolectomy on 06/17/2016. At time of surgery, tumor nodule in the right lobe of the liver was positive on biopsy for adenocarcinoma. Patient currently undergoing chemotherapy.  COMPARISON: Outside studies including CT chest abdomen pelvis 05/23/2016, PET CT 07/15/2016, and MR abdomen 07/27/2016   TECHNIQUE: Multiplanar, multisequence MR images of the abdomen and pelvis were obtained before and after intravenous administration of gadolinium-based contrast.   FINDINGS:  LOWER CHEST Heart: Normal size. No pericardial effusion. Lungs/pleura: No masses, consolidations, or effusions.  ABDOMEN Liver: Liver is enlarged, measuring 20 cm in greatest  craniocaudal dimension. Hepatic steatosis. Multiple T2 hyperintense and T1 iso to hypointense lesions throughout the liver as marked in series 16, decreased in size compared to outside MR 07/27/2016. Largest lesion is in segment 8 and measures 1.4 cm and demonstrates peripheral enhancement. No new lesion. Gallbladder: Normal. No stones or inflammatory changes. Biliary: No obstruction. Spleen: Spleen is mildly enlarged, measuring 13.8 cm in greatest length. Pancreas: Normal. Adrenals: Normal. Kidneys: Multiple T2 hyperintense nonenhancing lesions in the lower pole of the kidneys bilaterally, consistent with cysts. No hydronephrosis. Stomach/bowel: Right hemicolectomy. Peritoneum: Decreased size of peritoneal nodule in  the left paracolic gutter measuring 6 mm (series 10, image 45). Decreased size of peritoneal nodule in the left lower quadrant along the ventral abdominal wall measuring 8 mm (series 23, image 13). No apparent new measurable lesions. Extraperitoneum: Within normal limits. Vascular: Within normal limits.  PELVIS Ureters: Within normal limits. Bladder: Within normal limits. Reproductive system: Within normal limits.  MSK: Lower midline ventral abdominal wall scar.   CT CHEST WO CONTRAST2/26/2018 [facility] Result Impression   1. Congenital variant of bilateral middle lobes. 2. Groundglass opacification with regions of bronchiectasis in the right lower lobe adjacent to the fissure, consistent with inflammatory/infectious changes. 3. No definitive evidence of metastatic disease to the chest. 4. Probable sebaceous cyst in the subcutaneous tissues beneath the left anterior chest.  Result Narrative  CT CHEST WO CONTRAST, 10/12/2016 3:24 PM  INDICATION:stage IV colon cancer \ C18.9 Metastatic colon cancer to liver (HCC) \ C78.7 Metastatic colon cancer to liver (HCC) \ C78.6 Peritoneal carcinomatosis (HCC) \ C80.1 Peritoneal carcinomatosis (HCC)  COMPARISON:  None  TECHNIQUE: Multislice axial images were obtained through the chest without administration of iodinated intravenous contrast material. Multi-planar reformatted images were generated for additional analysis. Nongated technique limits cardiac detail.  All CT scans at [facility] and [facility] are performed using dose optimization techniques as appropriate to a performed exam, including but not limited to one or more of the following: automated exposure control, adjustment of the mA and/or kV according to patient size, use of iterative reconstruction technique. In addition, [facility] is participating in the ACR Dose Registry program which will further assist us in optimizing patient radiation exposure.   FINDINGS:   Thoracic inlet/central airways: Parenchyma is within normal limits for age. The trachea is patent. Mediastinum/hila/axilla: Scattered, nonpathologically enlarged nodes. Heart/vessels: Normal heart size. Trace pericardial effusion. Coronary artery calcifications.. The tip of the central line is in the distal SVC. Lungs/pleura: There are bilateral middle lobes. Subtle groundglass opacification surrounding the posterior aspect of the right major fissure. This is seen in association with some bronchiectasis. Upper abdomen: See concurrent MRI study. Chest wall/MSK: Multi-segmented sternum. 1.5 cm low-density soft tissue nodule in the subcutaneous tissues beneath the left anterior chest.     MR LIVER WWO CONTRAST5/18/2018 [facility] Result Impression   1.When compared to the February 2018 MRI, there has been no interval change in the multiple T2 hyperintense lesions scattered throughout the liver compatible with metastases. When compared to the initial MRI performed December 2017, lesions have decreased in size. 2.No evidence of new hepatic or peritoneal lesions.    CT CHEST ABDOMEN PELVIS W CONTRAST  (ROUTINE)01/01/2017 [facility] Result Impression   1. Most of the small hepatic lesions noted on prior MRI are not seen on today's CT, likely due to technical differences inherent to the modalities. The largest lesion noted in segment 8 on the prior examination is unchanged in size. 2. Tiny implant along the upper left paracolic gutter, quite similar to the February 2018 MRI. No ascites or new peritoneal lesions are otherwise evident. 3. Status post right hemicolectomy. 4. Hepatic steatosis. 5. Additional ancillary findings as above.     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 KRAS mutated BRAF, NRAS mutation neg MSI Stable.  PET/CT scan   done on 06/25/2016 Show multiple foci of hypermetabolic hepatic metastases (atleast 4-5) and 2 hypermetabolic peritoneal nodules along the dorsal peritoneal surface concerning for peritoneal metastases.  MRI Liver 07/27/2016 - Multiple small liver metastases throughout the right and left hepatic lobes, largest measuring 2.1 cm. 1.2 cm enhancing peritoneal nodule in left paracolic gutter, suspicious for peritoneal metastasis. Other small peritoneal nodules better visualized on recent PET-CT which showed hypermetabolic activity, also suspicious for peritoneal metastases.   MRI Abd 10/12/2016 at [facility] --shows improvement in all the liver and the 2 peritoneal lesions and no new lesions.  CEA level continues to improve.  MRI liver and CT C/A/P on 01/01/2017 as noted above showed improved/stable disease.  2) Mild thrombocytopenia PLT improved from 84k to 95k (already have held 5FU bolus dose). Platelets have continued to remain stable. PLAN -no overt prohibitive toxicities from 5FU/leucovorin except mild thrombocytopenia. -continue 5FU/LV  without bolus -f/u with [facility] for serial hepatic Y90 radio-embolization for better control of hepatic metastatic disease. This will be decided based onrpt MRI abd at [facility] next month. (follows with Dr Kouri IR at [facility] for Y-90 treatments) -grade 1 neuropathy stable/imporved --continue Cymbalta 60mg po daily. -conitnue follow up with Dr. Swayne to optimize control of his diabetes. -continue maintenance 5FU/leucovorin q2weeks. Might need to switch to q3weeks if thrombocytopenia becomes limiting <75k or will cut down 5FU dose. -will continue maintenance 5FU/leucovorin for controlling peritoneal disease and may consider rad onc consultation for RT to residual peritoneal disease post liver directed therapies after completion of Y-90 hepatic embolization. -He will f/u on Nov 15th with Dr Kouri at [facility] for MRI to evaluate his disease further following his hepatic embolizations. He has follow up in his clinic following this.  -He has experienced some BPPV recently, we will refer him to vestibular physical therapy to help aid with this.    #2 . 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  Iron def resolved. 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 Chemotherapy-induced neuropathy (HCC) 11/18/2016  . Genetic testing 10/30/2016  . Port catheter in place 09/25/2016  . Family history of colon cancer 07/24/2016  . Family history of prostate cancer 07/24/2016  . Metastatic colon cancer to liver (HCC) 07/01/2016  . Right Colon cancer metastasized to liver (HCC) 06/17/2016  . Chronic GI bleeding 05/29/2016  . Diabetes mellitus type 2, diet-controlled (HCC) 05/22/2016  .  OSA (obstructive sleep apnea) 05/22/2016  . Hyperlipidemia 05/22/2016  . Anemia due to blood loss, chronic   . Antineoplastic chemotherapy induced pancytopenia (CODE) (HCC) 05/21/2016  Plan:  -Continue follow-up with primary care physician .Swayne, David, MD for optimization of diabetes management . Might need basal insulin if his blood sugars are elevated with chemotherapy/ steroids . -Continue use of CPAP for sleep apnea .   RTC 5FU/leucovorin q2weeks Labs q2weeks RTC with Dr  in 4 weeks Referral to PT for vestibular PT for BPPV   All of the patients questions were answered with apparent satisfaction. The patient knows to call the clinic with any problems, questions or concerns.  I spent 20 minutes   counseling the patient face to face. The total time spent in the appointment was 25 minutes and more than 50% was on counseling and direct patient cares.      MD MS AAHIVMS [facility] C[facility] Hematology/Oncology Physician [facility]  (Office):       [phone] (Work cell):  [phone] (Fax):           [phone]  This document serves as a record of services personally performed by  , MD. It was created on his behalf by William Andrew Hiatt, a trained medical scribe. The creation of this record is based on the scribe's personal observations and the provider's statements to them. This document has been checked and approved by the attending provider. 

## 2017-04-29 NOTE — Patient Instructions (Signed)

## 2017-04-29 NOTE — Telephone Encounter (Signed)
Scheduled appt per 9/13 los - patient to get new schedule in the treatment area.

## 2017-05-01 ENCOUNTER — Ambulatory Visit (HOSPITAL_BASED_OUTPATIENT_CLINIC_OR_DEPARTMENT_OTHER): Payer: 59

## 2017-05-01 VITALS — BP 133/77 | HR 67 | Temp 98.4°F | Resp 17

## 2017-05-01 DIAGNOSIS — C189 Malignant neoplasm of colon, unspecified: Secondary | ICD-10-CM | POA: Diagnosis not present

## 2017-05-01 DIAGNOSIS — C787 Secondary malignant neoplasm of liver and intrahepatic bile duct: Principal | ICD-10-CM

## 2017-05-01 MED ORDER — SODIUM CHLORIDE 0.9% FLUSH
10.0000 mL | INTRAVENOUS | Status: DC | PRN
  Administered 2017-05-01: 10 mL
  Filled 2017-05-01: qty 10

## 2017-05-01 MED ORDER — HEPARIN SOD (PORK) LOCK FLUSH 100 UNIT/ML IV SOLN
500.0000 [IU] | Freq: Once | INTRAVENOUS | Status: AC | PRN
  Administered 2017-05-01: 500 [IU]
  Filled 2017-05-01: qty 5

## 2017-05-03 ENCOUNTER — Other Ambulatory Visit: Payer: Self-pay | Admitting: Hematology

## 2017-05-03 DIAGNOSIS — H8110 Benign paroxysmal vertigo, unspecified ear: Secondary | ICD-10-CM

## 2017-05-13 ENCOUNTER — Ambulatory Visit: Payer: 59

## 2017-05-13 ENCOUNTER — Other Ambulatory Visit (HOSPITAL_BASED_OUTPATIENT_CLINIC_OR_DEPARTMENT_OTHER): Payer: 59

## 2017-05-13 ENCOUNTER — Ambulatory Visit (HOSPITAL_BASED_OUTPATIENT_CLINIC_OR_DEPARTMENT_OTHER): Payer: 59

## 2017-05-13 DIAGNOSIS — C787 Secondary malignant neoplasm of liver and intrahepatic bile duct: Secondary | ICD-10-CM

## 2017-05-13 DIAGNOSIS — C189 Malignant neoplasm of colon, unspecified: Secondary | ICD-10-CM

## 2017-05-13 DIAGNOSIS — C786 Secondary malignant neoplasm of retroperitoneum and peritoneum: Secondary | ICD-10-CM

## 2017-05-13 DIAGNOSIS — D696 Thrombocytopenia, unspecified: Secondary | ICD-10-CM

## 2017-05-13 DIAGNOSIS — Z95828 Presence of other vascular implants and grafts: Secondary | ICD-10-CM

## 2017-05-13 DIAGNOSIS — Z5111 Encounter for antineoplastic chemotherapy: Secondary | ICD-10-CM | POA: Diagnosis not present

## 2017-05-13 DIAGNOSIS — D500 Iron deficiency anemia secondary to blood loss (chronic): Secondary | ICD-10-CM

## 2017-05-13 LAB — CBC & DIFF AND RETIC
BASO%: 0.6 % (ref 0.0–2.0)
Basophils Absolute: 0 10*3/uL (ref 0.0–0.1)
EOS ABS: 0.2 10*3/uL (ref 0.0–0.5)
EOS%: 3.5 % (ref 0.0–7.0)
HEMATOCRIT: 45.3 % (ref 38.4–49.9)
HGB: 14.9 g/dL (ref 13.0–17.1)
Immature Retic Fract: 5.4 % (ref 3.00–10.60)
LYMPH%: 13.9 % — ABNORMAL LOW (ref 14.0–49.0)
MCH: 32.2 pg (ref 27.2–33.4)
MCHC: 32.9 g/dL (ref 32.0–36.0)
MCV: 97.8 fL (ref 79.3–98.0)
MONO#: 0.7 10*3/uL (ref 0.1–0.9)
MONO%: 11.6 % (ref 0.0–14.0)
NEUT%: 70.4 % (ref 39.0–75.0)
NEUTROS ABS: 4.4 10*3/uL (ref 1.5–6.5)
Platelets: 97 10*3/uL — ABNORMAL LOW (ref 140–400)
RBC: 4.63 10*6/uL (ref 4.20–5.82)
RDW: 15.7 % — ABNORMAL HIGH (ref 11.0–14.6)
RETIC %: 2.39 % — AB (ref 0.80–1.80)
RETIC CT ABS: 110.66 10*3/uL — AB (ref 34.80–93.90)
WBC: 6.3 10*3/uL (ref 4.0–10.3)
lymph#: 0.9 10*3/uL (ref 0.9–3.3)

## 2017-05-13 LAB — COMPREHENSIVE METABOLIC PANEL
ALT: 38 U/L (ref 0–55)
AST: 34 U/L (ref 5–34)
Albumin: 4 g/dL (ref 3.5–5.0)
Alkaline Phosphatase: 166 U/L — ABNORMAL HIGH (ref 40–150)
Anion Gap: 10 mEq/L (ref 3–11)
BUN: 11.2 mg/dL (ref 7.0–26.0)
CALCIUM: 10 mg/dL (ref 8.4–10.4)
CHLORIDE: 105 meq/L (ref 98–109)
CO2: 26 meq/L (ref 22–29)
Creatinine: 0.9 mg/dL (ref 0.7–1.3)
EGFR: >90 mL/min/{1.73_m2} (ref >90)
Glucose: 156 mg/dl — ABNORMAL HIGH (ref 70–140)
POTASSIUM: 4.2 meq/L (ref 3.5–5.1)
Sodium: 141 mEq/L (ref 136–145)
Total Bilirubin: 1.07 mg/dL (ref 0.20–1.20)
Total Protein: 7.3 g/dL (ref 6.4–8.3)

## 2017-05-13 MED ORDER — PALONOSETRON HCL INJECTION 0.25 MG/5ML
0.2500 mg | Freq: Once | INTRAVENOUS | Status: AC
  Administered 2017-05-13: 0.25 mg via INTRAVENOUS

## 2017-05-13 MED ORDER — DEXAMETHASONE SODIUM PHOSPHATE 10 MG/ML IJ SOLN
10.0000 mg | Freq: Once | INTRAMUSCULAR | Status: AC
  Administered 2017-05-13: 10 mg via INTRAVENOUS

## 2017-05-13 MED ORDER — SODIUM CHLORIDE 0.9 % IV SOLN
2400.0000 mg/m2 | INTRAVENOUS | Status: DC
  Administered 2017-05-13: 5800 mg via INTRAVENOUS
  Filled 2017-05-13: qty 116

## 2017-05-13 MED ORDER — LEUCOVORIN CALCIUM INJECTION 350 MG
400.0000 mg/m2 | Freq: Once | INTRAMUSCULAR | Status: AC
  Administered 2017-05-13: 964 mg via INTRAVENOUS
  Filled 2017-05-13: qty 48.2

## 2017-05-13 MED ORDER — DEXAMETHASONE SODIUM PHOSPHATE 10 MG/ML IJ SOLN
INTRAMUSCULAR | Status: AC
  Filled 2017-05-13: qty 1

## 2017-05-13 MED ORDER — SODIUM CHLORIDE 0.9% FLUSH
10.0000 mL | INTRAVENOUS | Status: DC | PRN
  Filled 2017-05-13: qty 10

## 2017-05-13 MED ORDER — PALONOSETRON HCL INJECTION 0.25 MG/5ML
INTRAVENOUS | Status: AC
  Filled 2017-05-13: qty 5

## 2017-05-13 NOTE — Progress Notes (Signed)
Labs including plts and alk phos reviewed by MD- ok to proceed with treatment.

## 2017-05-15 ENCOUNTER — Ambulatory Visit (HOSPITAL_BASED_OUTPATIENT_CLINIC_OR_DEPARTMENT_OTHER): Payer: 59

## 2017-05-15 VITALS — BP 134/82 | HR 76 | Temp 98.3°F | Resp 18

## 2017-05-15 DIAGNOSIS — C787 Secondary malignant neoplasm of liver and intrahepatic bile duct: Principal | ICD-10-CM

## 2017-05-15 DIAGNOSIS — C189 Malignant neoplasm of colon, unspecified: Secondary | ICD-10-CM

## 2017-05-15 MED ORDER — HEPARIN SOD (PORK) LOCK FLUSH 100 UNIT/ML IV SOLN
500.0000 [IU] | Freq: Once | INTRAVENOUS | Status: AC | PRN
  Administered 2017-05-15: 500 [IU]
  Filled 2017-05-15: qty 5

## 2017-05-15 MED ORDER — SODIUM CHLORIDE 0.9% FLUSH
10.0000 mL | INTRAVENOUS | Status: DC | PRN
  Administered 2017-05-15: 10 mL
  Filled 2017-05-15: qty 10

## 2017-05-20 NOTE — Progress Notes (Signed)
[name]    HEMATOLOGY/ONCOLOGY CLINIC NOTE  Date of Service: 05/27/17  Patient Care Team: [name], MD as PCP - General (Family Medicine) [name] M.D. 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 KRAS mutated BRAF, NRAS mutation neg MSI Stable.  PET/CT scan done on 06/25/2016 Show multiple foci of hypermetabolic hepatic metastases (atleast 4-5) and 2 hypermetabolic peritoneal nodules along the dorsal peritoneal surface concerning for peritoneal metastases.  MRI Liver 07/27/2016 - Multiple small liver metastases throughout the right and left hepatic lobes, largest measuring 2.1 cm. 1.2 cm enhancing peritoneal nodule in left paracolic gutter, suspicious for peritoneal metastasis. Other small peritoneal nodules better visualized on recent PET-CT which showed hypermetabolic activity, also suspicious for peritoneal metastases.   CT CHEST WO CONTRAST 10/12/2016  [facility] Result Impression   1. Congenital variant of bilateral middle lobes. 2. Groundglass opacification with regions of bronchiectasis in the right lower lobe adjacent to the fissure, consistent with inflammatory/infectious changes. 3. No definitive evidence of metastatic disease to the chest. 4. Probable sebaceous cyst in the subcutaneous tissues beneath the left anterior chest.   MR ABDOMEN AND PELVIS [id number] FOLLOW UP2/26/2018 [facility] Result Impression    Decreased size of multiple hepatic lesions and two peritoneal nodules compared to outside MRI 07/27/2016. No new lesions identified in  the abdomen or pelvis.     PREVIOUS TREATMENT  FOLFOX x 10 cycles. 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 (without 5FU bolus - due to thrombocytopenia). S/p Y-90 treatment to 1 hepatic lobe.  Plan for bilobar hepatic Y90 radio-embolization.  INTERVAL HISTORY  Patient is here for follow-up for his metastatic colon cancer and for cycle 22 of 5FU/leucovorin for maintenance therapy. Pt presents to the office today accompanied by his wife. He reports that he has been doing well overall. Pt voices concern for increased amount of drowsiness and fatigue following chemotherapy. He states that he typically has increased drowsiness and fatigue lasting 4-5 days following chemotherapy. He sleeps approximately 8 hours of sleep nightly. He notes that he takes Cymbalta during the night and reports that he doesn't use his CPAP nightly. Wife states that the patient doesn't wear his CPAP following chemotherapy. He notes that he typically has blood sugars between 150-180 and he recently had his A1c test drawn and I was noted to be at 7, which is the highest it has been. He recently obtained his flu vaccination. He notes that he has several referrals for a neurologist for his neck pain and an orthopedist for his knee pain.     On review of systems, pt reports increased drowsiness and fatigue. He denies insomnia. He reports unchanged neuropathy. He reports intermittent right sided abdominal pain that is exacerbated with constipation. Pt reports resolved mouth sore.    MEDICAL HISTORY:  Past Medical History:  Diagnosis Date  . Anemia   . Arthritis   . Cancer ([place])   . Diabetes mellitus without complication (HCC)    diet controlled  . History of blood transfusion   .  Hyperlipidemia   . Sleep apnea    cpap  . Wears glasses     SURGICAL HISTORY: Past Surgical  History:  Procedure Laterality Date  . COLON SURGERY    . IR GENERIC HISTORICAL  09/24/2016   IR CV LINE INJECTION 09/24/2016 [facility]  . IR GENERIC HISTORICAL  09/24/2016   IR US GUIDE VASC ACCESS RIGHT 09/24/2016 [facility]  . IR GENERIC HISTORICAL  09/24/2016   IR FLUORO GUIDE CV LINE RIGHT 09/24/2016 [facility]  . IR GENERIC HISTORICAL  09/30/2016   IR FLUORO GUIDE PORT INSERTION RIGHT 09/30/2016 Markus Daft, MD [facility]  . IR GENERIC HISTORICAL  09/30/2016   IR US GUIDE VASC ACCESS RIGHT 09/30/2016 Markus Daft, MD [facility]  . IR GENERIC HISTORICAL  09/30/2016   IR REMOVAL TUN ACCESS W/ PORT W/O FL MOD SED 09/30/2016 Markus Daft, MD [facility]  . LAPAROSCOPIC RIGHT HEMI COLECTOMY Right 06/17/2016   Procedure: LAPAROSCOPIC ASSISTED  RIGHT HEMI COLECTOMY;  Surgeon: [name], MD;  Location: [facility];  Service: General;  Laterality: Right;  . PORTACATH PLACEMENT Left 07/13/2016   Procedure: INSERTION PORT-A-CATH left subclavian;  Surgeon: [name], MD;  Location: [facility];  Service: General;  Laterality: Left;  . SHOULDER ACROMIOPLASTY Right 12/20/2014   Procedure: SHOULDER ACROMIOPLASTY;  Surgeon: Melrose Nakayama, MD;  Location: [place];  Service: Orthopedics;  Laterality: Right;  . SHOULDER ARTHROSCOPY Right 12/20/2014   Procedure: RIGHT ARTHROSCOPY SHOULDER WITH DEBRIDEMENT;  Surgeon: Melrose Nakayama, MD;  Location: [place];  Service: Orthopedics;  Laterality: Right;  . TESTICLE SURGERY     as teen  . TONSILLECTOMY    . WISDOM TOOTH EXTRACTION      SOCIAL HISTORY: Social History   Social History  . Marital status: Married    Spouse name: N/A  . Number of children: N/A  . Years of education: N/A   Occupational History  . cemetery maintenance    Social History Main Topics  . Smoking status: Former Smoker    Packs/day: 1.50    Years: 29.00    Types: Cigarettes    Quit date: 12/16/2012  . Smokeless tobacco: Never Used     Comment: 1-2  ppd from age [age]y to 2014  . Alcohol use 1.8 oz/week    3 Shots of liquor per week     Comment:  some days  . Drug use: No  . Sexual activity: Not on file   Other Topics Concern  . Not on file   Social History Narrative  . No narrative on file    FAMILY HISTORY: Family History  Problem Relation Age of Onset  . Diabetes Other   . Hyperlipidemia Other   . Hypertension Other   . Breast cancer Maternal Aunt 70  . Prostate cancer Maternal Uncle 75  . Lung cancer Paternal Uncle 37  . Stroke Maternal Grandfather 72  . Cancer Paternal Grandmother 22       dx cancer of pancreas and colon, unknown if separate primaries  . Heart Problems Paternal Grandfather        d. 60  . Prostate cancer Maternal Uncle 24  . Breast cancer Maternal Aunt 75  . Breast cancer Maternal Aunt 68  . Cervical cancer Maternal Aunt 68  . Pancreatic cancer Maternal Aunt 54       d. 58y; heavy smoker  . Colon cancer Paternal Uncle 13       s/p partial colectomy; dx. second  colon cancer at age 37-64    ALLERGIES:  is allergic to penicillins.  MEDICATIONS:  Current Outpatient Prescriptions  Medication Sig Dispense Refill  . dexamethasone (DECADRON) 4 MG tablet Take 2 tablets (8 mg total) by mouth daily. Start the day after chemotherapy for 2 days. Take with food. 30 tablet 0  . DULoxetine (CYMBALTA) 60 MG capsule Take 1 capsule (60 mg total) by mouth daily. 30 capsule 1  . lidocaine-prilocaine (EMLA) cream Apply to affected area once 30 g 3  . LORazepam (ATIVAN) 0.5 MG tablet Take 1 tablet (0.5 mg total) by mouth every 8 (eight) hours. For anxiety or sleep 30 tablet 0  . Multiple Vitamin (MULTIVITAMIN) tablet Take 1 tablet by mouth daily.    . ondansetron (ZOFRAN) 8 MG tablet Take 1 tablet (8 mg total) by mouth 2 (two) times daily as needed for refractory nausea / vomiting. Start on day 3 after chemotherapy. 30 tablet 1  . Probiotic Product (PROBIOTIC PO) Take 1 tablet by mouth daily.    .  prochlorperazine (COMPAZINE) 10 MG tablet Take 1 tablet (10 mg total) by mouth every 6 (six) hours as needed (Nausea or vomiting). 30 tablet 1  . traMADol (ULTRAM) 50 MG tablet Take 1 tablet (50 mg total) by mouth every 6 (six) hours as needed for moderate pain or severe pain. 60 tablet 0   No current facility-administered medications for this visit.     REVIEW OF SYSTEMS:    10 Point review of Systems was done is negative except as noted above.  PHYSICAL EXAMINATION:  ECOG PERFORMANCE STATUS: 1 - Symptomatic but completely ambulatory VS GENERAL:alert, in no acute distress and comfortable SKIN: skin color, texture, turgor are normal, no rashes or significant lesions EYES: normal, conjunctiva are pink and non-injected, sclera clear OROPHARYNX:no exudate, no erythema and lips, buccal mucosa, and tongue normal  NECK: supple, no JVD, thyroid normal size, non-tender, without nodularity LYMPH:  no palpable lymphadenopathy in the cervical, axillary or inguinal LUNGS: clear to auscultation with normal respiratory effort HEART: regular rate & rhythm,  no murmurs and no lower extremity edema ABDOMEN: abdomen soft, bowel sounds normoactive, healing laparoscopic surgical incisions with no discharge look clean. Musculoskeletal: no cyanosis of digits and no clubbing  PSYCH: alert & oriented x 3 with fluent speech NEURO: no focal motor/sensory deficits   LABORATORY DATA:  I have reviewed the data as listed  .[name] CBC Latest Ref Rng & Units 05/27/2017 05/13/2017 04/29/2017  WBC 4.0 - 10.3 10e3/uL 5.2 6.3 4.6  Hemoglobin 13.0 - 17.1 g/dL 14.8 14.9 14.5  Hematocrit 38.4 - 49.9 % 45.0 45.3 44.2  Platelets 140 - 400 10e3/uL 97(L) 97(L) 95(L)   . CMP Latest Ref Rng & Units 05/27/2017 05/13/2017 04/29/2017  Glucose 70 - 140 mg/dl 172(H) 156(H) 195(H)  BUN 7.0 - 26.0 mg/dL 9.5 11.2 8.7  Creatinine 0.7 - 1.3 mg/dL 0.9 0.9 0.9  Sodium 136 - 145 mEq/L 140 141 141  Potassium 3.5 - 5.1 mEq/L 4.2 4.2 4.2    Chloride 101 - 111 mmol/L - - -  CO2 22 - 29 mEq/L _0 Calcium 8.4 - 10.4 mg/dL 9.6 10.0 9.8  Total Protein 6.4 - 8.3 g/dL 7.0 7.3 7.1  Total Bilirubin 0.20 - 1.20 mg/dL 1.08 1.07 0.81  Alkaline Phos 40 - 150 U/L 142 166(H) 147  AST 5 - 34 U/L 34 34 36(H)  ALT 0 - 55 U/L 39 38 39  Microscopic Comment 2. COLON AND RECTUM (INCLUDING TRANS-ANAL RESECTION): Specimen: Terminal ileum, right colon and appendix Procedure: Segmental resection Tumor site: Proximal ascending colon Specimen integrity: Intact Macroscopic intactness of mesorectum: Not applicable: x Complete: NA Near complete: NA Incomplete: NA Cannot be determined (specify): NA Macroscopic tumor perforation: The mass invades through muscularis propria into pericolonic soft tissue Invasive tumor: Maximum size: 5.5 cm Histologic type(s): Adenocarcinoma Histologic grade and differentiation: G3 G1: well differentiated/low grade 1 of 4 Supplemental copy SUPPLEMENTAL for Orozco, Aki ([id number]) Microscopic Comment(continued) G2: moderately differentiated/low grade G3: poorly differentiated/high grade G4: undifferentiated/high grade Type of polyp in which invasive carcinoma arose: Tubular adenoma Microscopic extension of invasive tumor: The mass invade through the muscularis propria into pericolonic soft tissue Lymph-Vascular invasion: Identified Peri-neural invasion: Identified Tumor deposit(s) (discontinuous extramural extension): Present Resection margins: Proximal margin: Negative Distal margin: Negative Circumferential (radial) (posterior ascending, posterior descending; lateral and posterior mid-rectum; and entire lower 1/3 rectum):Negative Mesenteric margin (sigmoid and transverse): NA Distance closest margin (if all above margins negative): 3.7 cm from the non peritonealized pericolic soft tissue margin Trans-anal resection margins only: Deep margin: NA Mucosal Margin: NA Distance closest  mucosal margin (if negative): NA Treatment effect (neo-adjuvant therapy): NA Additional polyp(s): Negative Non-neoplastic findings: Unremarkable Lymph nodes: number examined 18; number positive: 5 Pathologic Staging: pT3, N2a, M1a Ancillary studies: MSI ordered      RADIOGRAPHIC STUDIES: I have personally reviewed the radiological images as listed and agreed with the findings in the report. No results found. 2018 February MR ABDOMEN AND PELVIS WWOTUMOR FOLLOW UP2/26/2018 [facility] Result Impression    Decreased size of multiple hepatic lesions and two peritoneal nodules compared to outside MRI 07/27/2016. No new lesions identified in the abdomen or pelvis.  Result Narrative  MR ABDOMEN AND PELVIS WITH AND WITHOUT CONTRAST (TUMOR FOLLOW-UP PROTOCOL), 10/12/2016 5:59 PM   INDICATION: liver and peritoneal colon cancer metastases \ C18.9 Metastatic colon cancer to liver (HCC) \ C78.7 Metastatic colon cancer to liver ([place]) \ C78.6 Peritoneal carcinomatosis ([place]) \ C80.1 Peritoneal carcinomatosis ([place])   ADDITIONAL HISTORY: History of stage IV colorectal cancer status post laparoscopic right hemicolectomy on 06/17/2016. At time of surgery, tumor nodule in the right lobe of the liver was positive on biopsy for adenocarcinoma. Patient currently undergoing chemotherapy.  COMPARISON: Outside studies including CT chest abdomen pelvis 05/23/2016, PET CT 07/15/2016, and MR abdomen 07/27/2016   TECHNIQUE: Multiplanar, multisequence MR images of the abdomen and pelvis were obtained before and after intravenous administration of gadolinium-based contrast.   FINDINGS:  LOWER CHEST Heart: Normal size. No pericardial effusion. Lungs/pleura: No masses, consolidations, or effusions.  ABDOMEN Liver: Liver is enlarged, measuring 20 cm in greatest craniocaudal dimension. Hepatic steatosis. Multiple T2 hyperintense and T1 iso to hypointense lesions throughout the liver as  marked in series 16, decreased in size compared to outside MR 07/27/2016. Largest lesion is in segment 8 and measures 1.4 cm and demonstrates peripheral enhancement. No new lesion. Gallbladder: Normal. No stones or inflammatory changes. Biliary: No obstruction. Spleen: Spleen is mildly enlarged, measuring 13.8 cm in greatest length. Pancreas: Normal. Adrenals: Normal. Kidneys: Multiple T2 hyperintense nonenhancing lesions in the lower pole of the kidneys bilaterally, consistent with cysts. No hydronephrosis. Stomach/bowel: Right hemicolectomy. Peritoneum: Decreased size of peritoneal nodule in the left paracolic gutter measuring 6 mm (series 10, image 45). Decreased size of peritoneal nodule in the left lower quadrant along the ventral abdominal wall measuring 8 mm (series 23, image 13). No apparent new measurable  lesions. Extraperitoneum: Within normal limits. Vascular: Within normal limits.  PELVIS Ureters: Within normal limits. Bladder: Within normal limits. Reproductive system: Within normal limits.  MSK: Lower midline ventral abdominal wall scar.   CT CHEST WO CONTRAST2/26/2018 [facility] Result Impression   1. Congenital variant of bilateral middle lobes. 2. Groundglass opacification with regions of bronchiectasis in the right lower lobe adjacent to the fissure, consistent with inflammatory/infectious changes. 3. No definitive evidence of metastatic disease to the chest. 4. Probable sebaceous cyst in the subcutaneous tissues beneath the left anterior chest.  Result Narrative  CT CHEST WO CONTRAST, 10/12/2016 3:24 PM  INDICATION:stage IV colon cancer \ C18.9 Metastatic colon cancer to liver ([place]) \ C78.7 Metastatic colon cancer to liver ([place]) \ C78.6 Peritoneal carcinomatosis ([place]) \ C80.1 Peritoneal carcinomatosis ([place])  COMPARISON: None  TECHNIQUE: Multislice axial images were obtained through the chest without administration of iodinated intravenous  contrast material. Multi-planar reformatted images were generated for additional analysis. Nongated technique limits cardiac detail.  All CT scans at [facility] and [place] are performed using dose optimization techniques as appropriate to a performed exam, including but not limited to one or more of the following: automated exposure control, adjustment of the mA and/or kV according to patient size, use of iterative reconstruction technique. In addition, [facility] is participating in the [place] program which will further assist [place] in optimizing patient radiation exposure.   FINDINGS:   Thoracic inlet/central airways: Parenchyma is within normal limits for age. The trachea is patent. Mediastinum/hila/axilla: Scattered, nonpathologically enlarged nodes. Heart/vessels: Normal heart size. Trace pericardial effusion. Coronary artery calcifications.. The tip of the central line is in the distal SVC. Lungs/pleura: There are bilateral middle lobes. Subtle groundglass opacification surrounding the posterior aspect of the right major fissure. This is seen in association with some bronchiectasis. Upper abdomen: See concurrent MRI study. Chest wall/MSK: Multi-segmented sternum. 1.5 cm low-density soft tissue nodule in the subcutaneous tissues beneath the left anterior chest.     MR LIVER WWO CONTRAST5/18/2018 [facility] Result Impression   1.When compared to the February 2018 MRI, there has been no interval change in the multiple T2 hyperintense lesions scattered throughout the liver compatible with metastases. When compared to the initial MRI performed December 2017, lesions have decreased in size. 2.No evidence of new hepatic or peritoneal lesions.    CT CHEST ABDOMEN PELVIS W CONTRAST (ROUTINE)01/01/2017 [facility] Result Impression   1. Most of the small hepatic lesions noted  on prior MRI are not seen on today's CT, likely due to technical differences inherent to the modalities. The largest lesion noted in segment 8 on the prior examination is unchanged in size. 2. Tiny implant along the upper left paracolic gutter, quite similar to the February 2018 MRI. No ascites or new peritoneal lesions are otherwise evident. 3. Status post right hemicolectomy. 4. Hepatic steatosis. 5. Additional ancillary findings as above.     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 KRAS mutated BRAF, NRAS mutation neg MSI Stable.  PET/CT scan done on 06/25/2016 Show multiple foci of hypermetabolic hepatic metastases (atleast 4-5) and 2 hypermetabolic peritoneal nodules along the dorsal peritoneal surface concerning for peritoneal metastases.  MRI Liver 07/27/2016 - Multiple small liver metastases throughout the  right and left hepatic lobes, largest measuring 2.1 cm. 1.2 cm enhancing peritoneal nodule in left paracolic gutter, suspicious for peritoneal metastasis. Other small peritoneal nodules better visualized on recent PET-CT which showed hypermetabolic activity, also suspicious for peritoneal metastases.   MRI Abd 10/12/2016 at [facility] --shows improvement in all the liver and the 2 peritoneal lesions and no new lesions.  CEA level continues to improve.  MRI liver and CT C/A/P on 01/01/2017 as noted above showed improved/stable disease. -CEA level pending today (05/27/2017).   -Discussed with the patient and his wife increased drowsiness and fatigue for approximately 4-5 days following chemotherapy. I recommended and advised the patient to optimize his CPAP treatment to aid with increased fatigue and drowsiness.  -Discussed with the patient and his wife regarding [place] to aid  with patient increased drowsiness and fatigue following chemotherapy. The patient is optimistic to try this program and I will send a referral.    2) Mild thrombocytopenia PLT improved from 84k to 95k (already have held 5FU bolus dose).  -Platelets at 97k as of today, 05/27/2017. Platelets have continued to remain stable.  PLAN -no overt prohibitive toxicities from 5FU/leucovorin except mild thrombocytopenia. -continue 5FU/LV without bolus -Continue f/u with [facility] for serial hepatic Y90 radio-embolization for better control of hepatic metastatic disease. This will be decided based on rpt MRI abd at [facility] next month. (follows with Dr Pecola Lawless IR at [facility] for Y-90 treatments) -grade 1 neuropathy stable/improved--continue Cymbalta 14m po daily. -conitnue follow up with Dr. SMoreen Fowlerto optimize control of his diabetes. -continue maintenance 5FU/leucovorin q2weeks. Might need to switch to q3weeks if thrombocytopenia becomes limiting <75k or will cut down 5FU dose. -will continue maintenance 5FU/leucovorin for controlling peritoneal disease and may consider rad onc consultation for RT to residual peritoneal disease post liver directed therapies after completion of Y-90 hepatic embolization. -[facility] following his hepatic embolizations. He has follow up in his clinic following this.  -He has experienced some BPPV recently, we will refer him to vestibular physical therapy to help aid with this.   #2 . 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  Iron def resolved. Lab Results  Component Value Date   IRON 302 (H) 09/23/2016   TIBC 481 (H) 09/23/2016   IRONPCTSAT 63 (H) 09/23/2016   (Iron and TIBC)  Lab Results  Component Value Date   FERRITIN 108 12/31/2016   Plan -now off PO iron   #5 Cervalgia due to DDD -tramadol prn  #6  Patient Active Problem List   Diagnosis  Date Noted  . Chemotherapy-induced neuropathy ([place] 11/18/2016  . Genetic testing 10/30/2016  . Port catheter in place 09/25/2016  . Family history of colon cancer 07/24/2016  . Family history of prostate cancer 07/24/2016  . Metastatic colon cancer to liver ([place] 07/01/2016  . Right Colon cancer metastasized to liver ([place] 06/17/2016  . Chronic GI bleeding 05/29/2016  . Diabetes mellitus type 2, diet-controlled ([place] 05/22/2016  . OSA (obstructive sleep apnea) 05/22/2016  . Hyperlipidemia 05/22/2016  . Anemia due to blood loss, chronic   . Antineoplastic chemotherapy induced pancytopenia (CODE) ([place] 05/21/2016  Plan:  -Continue follow-up with primary care physician .SAntony Contras MD for optimization of diabetes management . Might need basal insulin if his blood sugars are elevated with chemotherapy/ steroids . -Continue use of CPAP for sleep apnea .  RTC 5FU/leucovorin q2weeks Labs q2weeks RTC with Dr Irene Limbo in 4 weeks   All of the patients questions were answered with apparent satisfaction. The patient knows to call the clinic with any problems, questions or concerns.  I spent 20 minutes counseling the patient face to face. The total time spent in the appointment was 25 minutes and more than 50% was on counseling and direct patient cares.    Sullivan Lone MD [place] AAHIVMS [facility] Hematology/Oncology Physician [place]  (Office):       [phone] (Work cell):  [phone] (Fax):           [phone]  This document serves as a record of services personally performed by Sullivan Lone, MD. It was created on his behalf by Steva Colder, a trained medical scribe. The creation of this record is based on the scribe's personal observations and the provider's statements to them. This document has been checked and approved by the attending provider.

## 2017-05-25 ENCOUNTER — Telehealth: Payer: Self-pay

## 2017-05-25 DIAGNOSIS — C189 Malignant neoplasm of colon, unspecified: Secondary | ICD-10-CM | POA: Diagnosis not present

## 2017-05-25 DIAGNOSIS — Z23 Encounter for immunization: Secondary | ICD-10-CM | POA: Diagnosis not present

## 2017-05-25 DIAGNOSIS — E7800 Pure hypercholesterolemia, unspecified: Secondary | ICD-10-CM | POA: Diagnosis not present

## 2017-05-25 DIAGNOSIS — E119 Type 2 diabetes mellitus without complications: Secondary | ICD-10-CM | POA: Diagnosis not present

## 2017-05-25 NOTE — Telephone Encounter (Signed)
Pt wife called regarding status of FMLA paperwork. Need for it to be sent to office tomorrow in order for it to be in effect. Spoke with Jonelle Sidle, HIM. Paperwork for pt wife in the middle of documents to be completed, but in an effort to provider quality care and support for patients and families, Jonelle Sidle is completing this evening, and this RN will fax paperwork to pt wife's employer tomorrow after acquiring doctor signature.   Pt wife told this is the plan for completion. Verbalized understanding.

## 2017-05-25 NOTE — Telephone Encounter (Signed)
Spoke with Network engineer in Dr. Osborn Coho office in response to question regarding pt ability to have future colonoscopy procedures. At this time pt is receiving treatment. Dr. Irene Limbo does not feel definitively that pt should or should not have colonoscopy procedure. Decision will require discussion of risk vs. Necessity with Dr. Cristina Gong.

## 2017-05-26 ENCOUNTER — Telehealth: Payer: Self-pay

## 2017-05-26 NOTE — Telephone Encounter (Signed)
FMLA paperwork completed and faxed to Domenic Polite, RN with the [employer] at [phone]. Fax confirmation received and pt informed of completion and receipt. Documents are due today, and importance of processing noted on fax cover letter.

## 2017-05-27 ENCOUNTER — Other Ambulatory Visit: Payer: Self-pay | Admitting: Hematology

## 2017-05-27 ENCOUNTER — Other Ambulatory Visit (HOSPITAL_BASED_OUTPATIENT_CLINIC_OR_DEPARTMENT_OTHER): Payer: 59

## 2017-05-27 ENCOUNTER — Ambulatory Visit (HOSPITAL_BASED_OUTPATIENT_CLINIC_OR_DEPARTMENT_OTHER): Payer: 59

## 2017-05-27 ENCOUNTER — Ambulatory Visit (HOSPITAL_BASED_OUTPATIENT_CLINIC_OR_DEPARTMENT_OTHER): Payer: 59 | Admitting: Hematology

## 2017-05-27 ENCOUNTER — Ambulatory Visit: Payer: 59

## 2017-05-27 ENCOUNTER — Telehealth: Payer: Self-pay | Admitting: Hematology

## 2017-05-27 ENCOUNTER — Encounter: Payer: Self-pay | Admitting: Hematology

## 2017-05-27 VITALS — BP 123/71 | HR 83 | Temp 98.3°F | Resp 20 | Ht 74.0 in | Wt 252.4 lb

## 2017-05-27 DIAGNOSIS — C189 Malignant neoplasm of colon, unspecified: Secondary | ICD-10-CM

## 2017-05-27 DIAGNOSIS — Z5111 Encounter for antineoplastic chemotherapy: Secondary | ICD-10-CM

## 2017-05-27 DIAGNOSIS — R5383 Other fatigue: Secondary | ICD-10-CM

## 2017-05-27 DIAGNOSIS — Z7189 Other specified counseling: Secondary | ICD-10-CM

## 2017-05-27 DIAGNOSIS — C786 Secondary malignant neoplasm of retroperitoneum and peritoneum: Secondary | ICD-10-CM

## 2017-05-27 DIAGNOSIS — C787 Secondary malignant neoplasm of liver and intrahepatic bile duct: Secondary | ICD-10-CM

## 2017-05-27 DIAGNOSIS — D696 Thrombocytopenia, unspecified: Secondary | ICD-10-CM

## 2017-05-27 DIAGNOSIS — D500 Iron deficiency anemia secondary to blood loss (chronic): Secondary | ICD-10-CM

## 2017-05-27 DIAGNOSIS — Z95828 Presence of other vascular implants and grafts: Secondary | ICD-10-CM

## 2017-05-27 LAB — COMPREHENSIVE METABOLIC PANEL
ALT: 39 U/L (ref 0–55)
AST: 34 U/L (ref 5–34)
Albumin: 3.9 g/dL (ref 3.5–5.0)
Alkaline Phosphatase: 142 U/L (ref 40–150)
Anion Gap: 10 mEq/L (ref 3–11)
BUN: 9.5 mg/dL (ref 7.0–26.0)
CHLORIDE: 106 meq/L (ref 98–109)
CO2: 24 meq/L (ref 22–29)
Calcium: 9.6 mg/dL (ref 8.4–10.4)
Creatinine: 0.9 mg/dL (ref 0.7–1.3)
GLUCOSE: 172 mg/dL — AB (ref 70–140)
POTASSIUM: 4.2 meq/L (ref 3.5–5.1)
SODIUM: 140 meq/L (ref 136–145)
Total Bilirubin: 1.08 mg/dL (ref 0.20–1.20)
Total Protein: 7 g/dL (ref 6.4–8.3)

## 2017-05-27 LAB — CBC & DIFF AND RETIC
BASO%: 0.2 % (ref 0.0–2.0)
Basophils Absolute: 0 10*3/uL (ref 0.0–0.1)
EOS ABS: 0.2 10*3/uL (ref 0.0–0.5)
EOS%: 3.7 % (ref 0.0–7.0)
HCT: 45 % (ref 38.4–49.9)
HEMOGLOBIN: 14.8 g/dL (ref 13.0–17.1)
Immature Retic Fract: 2.6 % — ABNORMAL LOW (ref 3.00–10.60)
LYMPH%: 15.8 % (ref 14.0–49.0)
MCH: 32.2 pg (ref 27.2–33.4)
MCHC: 32.9 g/dL (ref 32.0–36.0)
MCV: 98 fL (ref 79.3–98.0)
MONO#: 0.7 10*3/uL (ref 0.1–0.9)
MONO%: 14 % (ref 0.0–14.0)
NEUT%: 66.3 % (ref 39.0–75.0)
NEUTROS ABS: 3.5 10*3/uL (ref 1.5–6.5)
Platelets: 97 10*3/uL — ABNORMAL LOW (ref 140–400)
RBC: 4.59 10*6/uL (ref 4.20–5.82)
RDW: 15.4 % — ABNORMAL HIGH (ref 11.0–14.6)
RETIC %: 1.66 % (ref 0.80–1.80)
Retic Ct Abs: 76.19 10*3/uL (ref 34.80–93.90)
WBC: 5.2 10*3/uL (ref 4.0–10.3)
lymph#: 0.8 10*3/uL — ABNORMAL LOW (ref 0.9–3.3)

## 2017-05-27 LAB — CEA (IN HOUSE-CHCC): CEA (CHCC-IN HOUSE): 4.25 ng/mL (ref 0.00–5.00)

## 2017-05-27 MED ORDER — DEXAMETHASONE SODIUM PHOSPHATE 10 MG/ML IJ SOLN
10.0000 mg | Freq: Once | INTRAMUSCULAR | Status: AC
  Administered 2017-05-27: 10 mg via INTRAVENOUS

## 2017-05-27 MED ORDER — SODIUM CHLORIDE 0.9 % IV SOLN
Freq: Once | INTRAVENOUS | Status: AC
  Administered 2017-05-27: 10:00:00 via INTRAVENOUS

## 2017-05-27 MED ORDER — PALONOSETRON HCL INJECTION 0.25 MG/5ML
0.2500 mg | Freq: Once | INTRAVENOUS | Status: AC
  Administered 2017-05-27: 0.25 mg via INTRAVENOUS

## 2017-05-27 MED ORDER — PALONOSETRON HCL INJECTION 0.25 MG/5ML
INTRAVENOUS | Status: AC
  Filled 2017-05-27: qty 5

## 2017-05-27 MED ORDER — DEXAMETHASONE SODIUM PHOSPHATE 10 MG/ML IJ SOLN
INTRAMUSCULAR | Status: AC
  Filled 2017-05-27: qty 1

## 2017-05-27 MED ORDER — LEUCOVORIN CALCIUM INJECTION 350 MG
400.0000 mg/m2 | Freq: Once | INTRAMUSCULAR | Status: AC
  Administered 2017-05-27: 964 mg via INTRAVENOUS
  Filled 2017-05-27: qty 48.2

## 2017-05-27 MED ORDER — SODIUM CHLORIDE 0.9% FLUSH
10.0000 mL | INTRAVENOUS | Status: DC | PRN
  Administered 2017-05-27: 10 mL via INTRAVENOUS
  Filled 2017-05-27: qty 10

## 2017-05-27 MED ORDER — SODIUM CHLORIDE 0.9 % IV SOLN
2400.0000 mg/m2 | INTRAVENOUS | Status: DC
  Administered 2017-05-27: 5800 mg via INTRAVENOUS
  Filled 2017-05-27: qty 116

## 2017-05-27 NOTE — Telephone Encounter (Signed)
Scheduled appt per 10/11 sch msg. Patient is aware of the time.

## 2017-05-27 NOTE — Patient Instructions (Signed)
[facility] Discharge Instructions for Patients Receiving Chemotherapy  Today you received the following chemotherapy agents leucovorin/florouracil   To help prevent nausea and vomiting after your treatment, we encourage you to take your nausea medication as directed   If you develop nausea and vomiting that is not controlled by your nausea medication, call the clinic.   BELOW ARE SYMPTOMS THAT SHOULD BE REPORTED IMMEDIATELY:  *FEVER GREATER THAN 100.5 F  *CHILLS WITH OR WITHOUT FEVER  NAUSEA AND VOMITING THAT IS NOT CONTROLLED WITH YOUR NAUSEA MEDICATION  *UNUSUAL SHORTNESS OF BREATH  *UNUSUAL BRUISING OR BLEEDING  TENDERNESS IN MOUTH AND THROAT WITH OR WITHOUT PRESENCE OF ULCERS  *URINARY PROBLEMS  *BOWEL PROBLEMS  UNUSUAL RASH Items with * indicate a potential emergency and should be followed up as soon as possible.  Feel free to call the clinic you have any questions or concerns. The clinic phone number is [phone].  

## 2017-05-27 NOTE — Progress Notes (Signed)
Ok to treat with platelets greater than 75 per last office note.

## 2017-05-29 ENCOUNTER — Ambulatory Visit (HOSPITAL_BASED_OUTPATIENT_CLINIC_OR_DEPARTMENT_OTHER): Payer: 59

## 2017-05-29 VITALS — BP 132/69 | HR 69 | Temp 98.3°F | Resp 16

## 2017-05-29 DIAGNOSIS — C787 Secondary malignant neoplasm of liver and intrahepatic bile duct: Secondary | ICD-10-CM

## 2017-05-29 DIAGNOSIS — Z7189 Other specified counseling: Secondary | ICD-10-CM

## 2017-05-29 DIAGNOSIS — C189 Malignant neoplasm of colon, unspecified: Secondary | ICD-10-CM | POA: Diagnosis not present

## 2017-05-29 MED ORDER — HEPARIN SOD (PORK) LOCK FLUSH 100 UNIT/ML IV SOLN
500.0000 [IU] | Freq: Once | INTRAVENOUS | Status: AC | PRN
  Administered 2017-05-29: 500 [IU]
  Filled 2017-05-29: qty 5

## 2017-05-29 MED ORDER — SODIUM CHLORIDE 0.9% FLUSH
10.0000 mL | INTRAVENOUS | Status: DC | PRN
  Administered 2017-05-29: 10 mL
  Filled 2017-05-29: qty 10

## 2017-06-01 DIAGNOSIS — M50323 Other cervical disc degeneration at C6-C7 level: Secondary | ICD-10-CM | POA: Diagnosis not present

## 2017-06-01 DIAGNOSIS — M50322 Other cervical disc degeneration at C5-C6 level: Secondary | ICD-10-CM | POA: Diagnosis not present

## 2017-06-02 DIAGNOSIS — M25562 Pain in left knee: Secondary | ICD-10-CM | POA: Diagnosis not present

## 2017-06-02 DIAGNOSIS — M222X2 Patellofemoral disorders, left knee: Secondary | ICD-10-CM | POA: Diagnosis not present

## 2017-06-08 DIAGNOSIS — M50322 Other cervical disc degeneration at C5-C6 level: Secondary | ICD-10-CM | POA: Diagnosis not present

## 2017-06-10 ENCOUNTER — Other Ambulatory Visit (HOSPITAL_BASED_OUTPATIENT_CLINIC_OR_DEPARTMENT_OTHER): Payer: 59

## 2017-06-10 ENCOUNTER — Ambulatory Visit (HOSPITAL_BASED_OUTPATIENT_CLINIC_OR_DEPARTMENT_OTHER): Payer: 59

## 2017-06-10 ENCOUNTER — Ambulatory Visit: Payer: 59

## 2017-06-10 VITALS — BP 138/83 | HR 84 | Temp 98.2°F | Resp 17

## 2017-06-10 DIAGNOSIS — C787 Secondary malignant neoplasm of liver and intrahepatic bile duct: Secondary | ICD-10-CM | POA: Diagnosis not present

## 2017-06-10 DIAGNOSIS — Z7189 Other specified counseling: Secondary | ICD-10-CM

## 2017-06-10 DIAGNOSIS — C786 Secondary malignant neoplasm of retroperitoneum and peritoneum: Secondary | ICD-10-CM

## 2017-06-10 DIAGNOSIS — C189 Malignant neoplasm of colon, unspecified: Secondary | ICD-10-CM

## 2017-06-10 DIAGNOSIS — Z95828 Presence of other vascular implants and grafts: Secondary | ICD-10-CM

## 2017-06-10 DIAGNOSIS — D500 Iron deficiency anemia secondary to blood loss (chronic): Secondary | ICD-10-CM

## 2017-06-10 DIAGNOSIS — Z5111 Encounter for antineoplastic chemotherapy: Secondary | ICD-10-CM

## 2017-06-10 DIAGNOSIS — D696 Thrombocytopenia, unspecified: Secondary | ICD-10-CM

## 2017-06-10 LAB — COMPREHENSIVE METABOLIC PANEL
ALK PHOS: 140 U/L (ref 40–150)
ALT: 42 U/L (ref 0–55)
AST: 39 U/L — AB (ref 5–34)
Albumin: 3.8 g/dL (ref 3.5–5.0)
Anion Gap: 10 mEq/L (ref 3–11)
BUN: 9.1 mg/dL (ref 7.0–26.0)
CALCIUM: 9.2 mg/dL (ref 8.4–10.4)
CO2: 22 meq/L (ref 22–29)
Chloride: 106 mEq/L (ref 98–109)
Creatinine: 0.8 mg/dL (ref 0.7–1.3)
EGFR: >60 mL/min/{1.73_m2} (ref >60)
GLUCOSE: 233 mg/dL — AB (ref 70–140)
POTASSIUM: 4.1 meq/L (ref 3.5–5.1)
Sodium: 138 mEq/L (ref 136–145)
Total Bilirubin: 0.78 mg/dL (ref 0.20–1.20)
Total Protein: 6.7 g/dL (ref 6.4–8.3)

## 2017-06-10 LAB — CBC & DIFF AND RETIC
BASO%: 0.9 % (ref 0.0–2.0)
BASOS ABS: 0 10*3/uL (ref 0.0–0.1)
EOS ABS: 0.2 10*3/uL (ref 0.0–0.5)
EOS%: 3.9 % (ref 0.0–7.0)
HEMATOCRIT: 43.9 % (ref 38.4–49.9)
HGB: 14.5 g/dL (ref 13.0–17.1)
Immature Retic Fract: 5.9 % (ref 3.00–10.60)
LYMPH#: 0.7 10*3/uL — AB (ref 0.9–3.3)
LYMPH%: 15.2 % (ref 14.0–49.0)
MCH: 32.2 pg (ref 27.2–33.4)
MCHC: 33 g/dL (ref 32.0–36.0)
MCV: 97.3 fL (ref 79.3–98.0)
MONO#: 0.7 10*3/uL (ref 0.1–0.9)
MONO%: 14.1 % — ABNORMAL HIGH (ref 0.0–14.0)
NEUT#: 3.1 10*3/uL (ref 1.5–6.5)
NEUT%: 65.9 % (ref 39.0–75.0)
Platelets: 93 10*3/uL — ABNORMAL LOW (ref 140–400)
RBC: 4.51 10*6/uL (ref 4.20–5.82)
RDW: 15 % — AB (ref 11.0–14.6)
RETIC %: 1.53 % (ref 0.80–1.80)
RETIC CT ABS: 69 10*3/uL (ref 34.80–93.90)
WBC: 4.7 10*3/uL (ref 4.0–10.3)

## 2017-06-10 LAB — CEA (IN HOUSE-CHCC): CEA (CHCC-In House): 4.66 ng/mL (ref 0.00–5.00)

## 2017-06-10 MED ORDER — LEUCOVORIN CALCIUM INJECTION 350 MG
400.0000 mg/m2 | Freq: Once | INTRAVENOUS | Status: AC
  Administered 2017-06-10: 964 mg via INTRAVENOUS
  Filled 2017-06-10: qty 48.2

## 2017-06-10 MED ORDER — PALONOSETRON HCL INJECTION 0.25 MG/5ML
0.2500 mg | Freq: Once | INTRAVENOUS | Status: AC
  Administered 2017-06-10: 0.25 mg via INTRAVENOUS

## 2017-06-10 MED ORDER — SODIUM CHLORIDE 0.9 % IV SOLN
2400.0000 mg/m2 | INTRAVENOUS | Status: DC
  Administered 2017-06-10: 5800 mg via INTRAVENOUS
  Filled 2017-06-10: qty 100

## 2017-06-10 MED ORDER — DEXAMETHASONE SODIUM PHOSPHATE 10 MG/ML IJ SOLN
INTRAMUSCULAR | Status: AC
  Filled 2017-06-10: qty 1

## 2017-06-10 MED ORDER — PALONOSETRON HCL INJECTION 0.25 MG/5ML
INTRAVENOUS | Status: AC
  Filled 2017-06-10: qty 5

## 2017-06-10 MED ORDER — DEXAMETHASONE SODIUM PHOSPHATE 10 MG/ML IJ SOLN
10.0000 mg | Freq: Once | INTRAMUSCULAR | Status: AC
  Administered 2017-06-10: 10 mg via INTRAVENOUS

## 2017-06-10 MED ORDER — SODIUM CHLORIDE 0.9 % IV SOLN
Freq: Once | INTRAVENOUS | Status: AC
  Administered 2017-06-10: 10:00:00 via INTRAVENOUS

## 2017-06-10 MED ORDER — SODIUM CHLORIDE 0.9% FLUSH
10.0000 mL | INTRAVENOUS | Status: DC | PRN
  Administered 2017-06-10: 10 mL via INTRAVENOUS
  Filled 2017-06-10: qty 10

## 2017-06-10 NOTE — Progress Notes (Signed)
Dr. Irene Limbo okay to tx with plt 93.

## 2017-06-10 NOTE — Patient Instructions (Signed)
[facility] Discharge Instructions for Patients Receiving Chemotherapy  Today you received the following chemotherapy agents:  Leucovorin, Adrucil  To help prevent nausea and vomiting after your treatment, we encourage you to take your nausea medication as prescribed.   If you develop nausea and vomiting that is not controlled by your nausea medication, call the clinic.   BELOW ARE SYMPTOMS THAT SHOULD BE REPORTED IMMEDIATELY:  *FEVER GREATER THAN 100.5 F  *CHILLS WITH OR WITHOUT FEVER  NAUSEA AND VOMITING THAT IS NOT CONTROLLED WITH YOUR NAUSEA MEDICATION  *UNUSUAL SHORTNESS OF BREATH  *UNUSUAL BRUISING OR BLEEDING  TENDERNESS IN MOUTH AND THROAT WITH OR WITHOUT PRESENCE OF ULCERS  *URINARY PROBLEMS  *BOWEL PROBLEMS  UNUSUAL RASH Items with * indicate a potential emergency and should be followed up as soon as possible.  Feel free to call the clinic should you have any questions or concerns. The clinic phone number is [phone].  Please show the [facility] at check-in to the Emergency Department and triage nurse.

## 2017-06-12 ENCOUNTER — Ambulatory Visit (HOSPITAL_BASED_OUTPATIENT_CLINIC_OR_DEPARTMENT_OTHER): Payer: 59

## 2017-06-12 VITALS — BP 131/78 | HR 90 | Temp 98.4°F | Resp 18

## 2017-06-12 DIAGNOSIS — C189 Malignant neoplasm of colon, unspecified: Secondary | ICD-10-CM | POA: Diagnosis not present

## 2017-06-12 DIAGNOSIS — C787 Secondary malignant neoplasm of liver and intrahepatic bile duct: Secondary | ICD-10-CM

## 2017-06-12 DIAGNOSIS — Z7189 Other specified counseling: Secondary | ICD-10-CM

## 2017-06-12 MED ORDER — HEPARIN SOD (PORK) LOCK FLUSH 100 UNIT/ML IV SOLN
500.0000 [IU] | Freq: Once | INTRAVENOUS | Status: AC | PRN
Start: 2017-06-12 — End: 2017-06-12
  Administered 2017-06-12: 500 [IU]
  Filled 2017-06-12: qty 5

## 2017-06-12 MED ORDER — SODIUM CHLORIDE 0.9% FLUSH
10.0000 mL | INTRAVENOUS | Status: DC | PRN
  Administered 2017-06-12: 10 mL
  Filled 2017-06-12: qty 10

## 2017-06-14 DIAGNOSIS — M50322 Other cervical disc degeneration at C5-C6 level: Secondary | ICD-10-CM | POA: Diagnosis not present

## 2017-06-15 ENCOUNTER — Ambulatory Visit: Payer: 59 | Attending: Hematology | Admitting: Physical Therapy

## 2017-06-15 DIAGNOSIS — C189 Malignant neoplasm of colon, unspecified: Secondary | ICD-10-CM | POA: Diagnosis present

## 2017-06-15 DIAGNOSIS — Z9181 History of falling: Secondary | ICD-10-CM | POA: Insufficient documentation

## 2017-06-15 NOTE — Therapy (Signed)
[place], [place], [address] Phone: [phone]   Fax:  [phone]  Physical Therapy Evaluation  Patient Details  Name: Scott Gallagher MRN: [id number] Date of Birth: 1971/07/21 Referring Provider: Dr. Sullivan Lone  Encounter Date: 06/15/2017      PT End of Session - 06/15/17 1738    Visit Number 1   Number of Visits 1   PT Start Time 6160   PT Stop Time 1522   PT Time Calculation (min) 47 min   Activity Tolerance Patient tolerated treatment well   Behavior During Therapy [facility] for tasks assessed/performed      Past Medical History:  Diagnosis Date  . Anemia   . Arthritis   . Cancer ([place])   . Diabetes mellitus without complication (HCC)    diet controlled  . History of blood transfusion   . Hyperlipidemia   . Sleep apnea    cpap  . Wears glasses     Past Surgical History:  Procedure Laterality Date  . COLON SURGERY    . IR GENERIC HISTORICAL  09/24/2016   IR CV LINE INJECTION 09/24/2016 [facility]  . IR GENERIC HISTORICAL  09/24/2016   IR US GUIDE VASC ACCESS RIGHT 09/24/2016 [facility]  . IR GENERIC HISTORICAL  09/24/2016   IR FLUORO GUIDE CV LINE RIGHT 09/24/2016 [facility]  . IR GENERIC HISTORICAL  09/30/2016   IR FLUORO GUIDE PORT INSERTION RIGHT 09/30/2016 Markus Daft, MD [facility]  . IR GENERIC HISTORICAL  09/30/2016   IR US GUIDE VASC ACCESS RIGHT 09/30/2016 Markus Daft, MD [facility]  . IR GENERIC HISTORICAL  09/30/2016   IR REMOVAL TUN ACCESS W/ PORT W/O FL MOD SED 09/30/2016 Markus Daft, MD [facility]  . LAPAROSCOPIC RIGHT HEMI COLECTOMY Right 06/17/2016   Procedure: LAPAROSCOPIC ASSISTED  RIGHT HEMI COLECTOMY;  Surgeon: [name], MD;  Location[facility];  Service: General;  Laterality: Right;  . PORTACATH PLACEMENT Left 07/13/2016   Procedure: INSERTION PORT-A-CATH left subclavian;  Surgeon: [name], MD;  Location: [facility];  Service: General;  Laterality: Left;  .  SHOULDER ACROMIOPLASTY Right 12/20/2014   Procedure: SHOULDER ACROMIOPLASTY;  Surgeon: Melrose Nakayama, MD;  Location: [facility];  Service: Orthopedics;  Laterality: Right;  . SHOULDER ARTHROSCOPY Right 12/20/2014   Procedure: RIGHT ARTHROSCOPY SHOULDER WITH DEBRIDEMENT;  Surgeon: Melrose Nakayama, MD;  Location: [facility];  Service: Orthopedics;  Laterality: Right;  . TESTICLE SURGERY     as teen  . TONSILLECTOMY    . WISDOM TOOTH EXTRACTION      There were no vitals filed for this visit.       Subjective Assessment - 06/15/17 1439    Subjective I don't know why I'm here today--doctor sent me.   Pertinent History Diagnosed with colon cancer in October 2017 with surgical resection 06/17/16; stage was IV with metastases to liver and peritoneum. Started adjuvant chemo and continues on that.  Will have a scan 07/01/17. Had radioactive beads put in the right half of the liver; they may do the left after the scan.  Neck pain since he was a teenager; is getting PT for this now.  Saw an orthopedist and was put in PT; has four more of those and then sees the doctor again.  Left knee has given him problems--it is painful.  Diabetic, under control with diet and exercise. Referral reads: "Chemotherapy related fatigue and neuropathy. Patient with metastatic colon cancer with near  NED status. For endurance strength and balance."   Patient Stated Goals none at this time   Currently in Pain? No/denies            [facility] PT Assessment - 06/15/17 0001      Assessment   Medical Diagnosis stage IV colon cancer   Referring Provider Dr. Sullivan Lone   Onset Date/Surgical Date 06/17/16  colon resection   Hand Dominance Right   Prior Therapy yes, currently, for his neck     Precautions   Precautions Other (comment)   Precaution Comments cancer precautions     Restrictions   Weight Bearing Restrictions No     Balance Screen   Has the patient fallen in the past 6 months Yes   reports what may be some positional vertigo   How many times? 2  missed a step in two different situations   Has the patient had a decrease in activity level because of a fear of falling?  No   Is the patient reluctant to leave their home because of a fear of falling?  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 each way     Cognition   Overall Cognitive Status Within Functional Limits for tasks assessed     Sensation   Additional Comments reports neuropathy in both feet (bottom of feet to ankles) and in hands; says it's decreased sensation and tingling     Posture/Postural Control   Posture/Postural Control Postural limitations   Postural Limitations Forward head     AROM   Overall AROM Comments Both UEs and LEs grossly WFL; trunk AROM also WFL  dizziness with some of these trunk motions     Strength   Overall Strength Comments UEs and LEs checked very grossly, and WFL   Right/Left hand Right;Left   Right Hand Gross Grasp --  125/119/116   Left Hand Gross Grasp --  115/119/114     Ambulation/Gait   Ambulation/Gait Yes   Ambulation/Gait Assistance 7: Independent     Balance   Balance Assessed Yes     Static Standing Balance   Static Standing - Balance Support No upper extremity supported   Static Standing - Level of Assistance 7: Independent   Static Standing - Comment/# of Minutes 30 seconds, eyes open and eyes closed; same standing on foam pad     Functional Gait  Assessment   Gait  assessed  Yes  15 meters in 5 seconds = 3 meters/second            Objective measurements completed on examination: See above findings.                  PT Education - 06/15/17 1737    Education provided Yes   Education Details about reasons to exercise   Person(s) Educated Patient   Methods Explanation;Handout  "Why exercise? flyer   Comprehension Verbalized understanding  [facility] Goals - 06/15/17 1748      CC Long Term Goal  #1   Title Pt. will be knowledgeable about benefits of exercise, including benefits related to cancer diagnosis.   Status Achieved             Plan - 06/15/17 1739    Clinical Impression Statement This is a gentleman with diagnosed stage IV colon cancer who has been treated with resection and then chemotherapy with good results.  He continues to have chemotherapy every two weeks.  He reports neuropathy with decreased sensation in both hands and feet. He did well on balance assessments performed today; his grip strength is above average for his age; his UE and LE strength is grossly 5/5 and ROM grossly WFL.  He does say that he is fatigued for three days following each chemo, but he describes it as begin sleepy, and then says he returns to normal after this period of time.  He has not been exercising regularly; this was encouraged today and patient was given a handout with data about the benefits of exercise, which seemed to motivate him.  We agreed that we would not plan further therapy at this time, but that patient would work on regular exercise on his own.  He did have episodic positional vertigo during evaluation and has a referral for treatment of this (elsewhere), which he was encouraged to pursue.  He said it hasn't bothered him much recently.   History and Personal Factors relevant to plan of care: DM, neck pain, left knee pain   Clinical Presentation Evolving   Clinical Presentation due to: continues  in chemotherapy   Clinical Decision Making Moderate   Rehab Potential Excellent   PT Frequency One time visit   PT Treatment/Interventions Patient/family education   PT Next Visit Plan No further therapy planned.     PT Home Exercise Plan Walking or other cardiovascular/endurance exercise.   Recommended Other Services none   Consulted and Agree with Plan of Care Patient      Patient will benefit from skilled therapeutic intervention in order to improve the following deficits and impairments:  Decreased activity tolerance  Visit Diagnosis: Malignant neoplasm of colon, unspecified part of colon ([place]) - Plan: PT plan of care cert/re-cert  History of falling - Plan: PT plan of care cert/re-cert     Problem List Patient Active Problem List   Diagnosis Date Noted  . Counseling regarding advanced care planning and goals of care 05/27/2017  . 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 Genetic testing 10/30/2016  . Port catheter in place 09/25/2016  . Family history of colon cancer 07/24/2016  . Family history of prostate cancer 07/24/2016  . Metastatic colon cancer to liver ([place]) 07/01/2016  . Right Colon cancer metastasized to liver ([place]) 06/17/2016  . Chronic GI bleeding 05/29/2016  . Diabetes mellitus type 2, diet-controlled ([place]) 05/22/2016  . OSA (obstructive sleep apnea) 05/22/2016  . Hyperlipidemia 05/22/2016  . Anemia due to blood loss, chronic   . Antineoplastic chemotherapy induced pancytopenia (CODE) ([place]) 05/21/2016    Alexxia Stankiewicz 06/15/2017, 5:51 PM  [place], [place], [address] Phone: [phone]   Fax:  [phone]  Name: Scott Gallagher MRN: [id number] Date of Birth: July 17, 1971  Serafina Royals, PT 06/15/17 5:51 PM

## 2017-06-15 NOTE — Patient Instructions (Signed)
Why exercise?  So many benefits! Here are SOME of them: 1. Heart health, including raising your good cholesterol level and reducing heart rate and blood pressure 2. Lung health, including improved lung capacity 3. It burns fats, and most of [place] can stand to be leaner, whether or not we are overweight. 4. It increases the body's natural painkillers and mood elevators, so makes you feel better. 5. Not only makes you feel better, but look better too 6. Improves sleep 7. Takes a bite out of stress 8. May decrease your risk of many types of cancer 9. If you are currently undergoing cancer treatment, exercise may improve your ability to tolerate treatments including chemotherapy. 10. For everybody, it can improve your energy level. Those with cancer-related fatigue report a 40-50% reduction in this symptom when exercising regularly. 11. If you are a survivor of breast, colon, or prostate cancer, it may decrease your risk of a recurrence. (This may hold for other cancers too, but so far we have data just for these three types.)  How to exercise: 1. Get your doctor's okay. 2. Pick something you enjoy doing, like walking, Zumba, biking, swimming, or whatever. 3. Start at low intensity and time, then gradually increase.  (See walking program handout.) 4. Set a goal to achieve over time.  The American Cancer Society, American Heart Association, and U.S. Dept. of Health and Human Services recommend 150 minutes of moderate exercise, 75 minutes of vigorous exercise, or a combination of both per week. This should be done in episodes at least 10 minutes long, spread throughout the week.  Need help being motivated? 1. Pick something you enjoy doing, because you'll be more inclined to stick with that activity than something that feels like a chore. 2. Do it with a friend so that you are accountable to each other. 3. Schedule it into your day. Place it on your calendar and keep that appointment just like you do  any appointment that you make. 4. Join an exercise group that meets at a specific time.  That way, you have to show up on time, and that makes it harder to procrastinate about doing your workout.  It also keeps you accountable-people begin to expect you to be there. 5. Join a gym where you feel comfortable and not intimidated, at the right cost. 6. Sign up for something that you'll need to be in shape for on a specific date, like a 1K or a 5K to walk or run, a 20 or 30 mile bike ride, a mud run or something like that. If the date is looming, you know you'll need to train to be ready for it.  An added benefit is that many of these are fundraisers for good causes. 7. If you've already paid for a gym membership, group exercise class or event, you might as well work out, so you haven't wasted your money!

## 2017-06-21 ENCOUNTER — Telehealth: Payer: Self-pay | Admitting: Hematology

## 2017-06-21 DIAGNOSIS — M50322 Other cervical disc degeneration at C5-C6 level: Secondary | ICD-10-CM | POA: Diagnosis not present

## 2017-06-21 NOTE — Telephone Encounter (Signed)
06/21/2017 @ 12:03 spoke with patient's wife @ [phone] and informed her that FMLA forms were completed and faxed to Domenic Polite @ [phone] @ 12:07 pm and also to the patient's wife @ [phone] @ 12:08 pm for their personal records.

## 2017-06-24 ENCOUNTER — Telehealth: Payer: Self-pay | Admitting: Hematology

## 2017-06-24 ENCOUNTER — Other Ambulatory Visit (HOSPITAL_BASED_OUTPATIENT_CLINIC_OR_DEPARTMENT_OTHER): Payer: 59

## 2017-06-24 ENCOUNTER — Ambulatory Visit: Payer: 59

## 2017-06-24 ENCOUNTER — Telehealth: Payer: Self-pay

## 2017-06-24 ENCOUNTER — Ambulatory Visit (HOSPITAL_BASED_OUTPATIENT_CLINIC_OR_DEPARTMENT_OTHER): Payer: 59

## 2017-06-24 ENCOUNTER — Ambulatory Visit (HOSPITAL_BASED_OUTPATIENT_CLINIC_OR_DEPARTMENT_OTHER): Payer: 59 | Admitting: Hematology

## 2017-06-24 ENCOUNTER — Other Ambulatory Visit: Payer: Self-pay | Admitting: Hematology

## 2017-06-24 ENCOUNTER — Encounter: Payer: Self-pay | Admitting: Hematology

## 2017-06-24 VITALS — BP 132/82 | HR 84 | Temp 98.2°F | Resp 18 | Ht 74.0 in | Wt 254.4 lb

## 2017-06-24 DIAGNOSIS — R42 Dizziness and giddiness: Secondary | ICD-10-CM

## 2017-06-24 DIAGNOSIS — C787 Secondary malignant neoplasm of liver and intrahepatic bile duct: Secondary | ICD-10-CM

## 2017-06-24 DIAGNOSIS — G620 Drug-induced polyneuropathy: Secondary | ICD-10-CM

## 2017-06-24 DIAGNOSIS — C786 Secondary malignant neoplasm of retroperitoneum and peritoneum: Secondary | ICD-10-CM

## 2017-06-24 DIAGNOSIS — Z95828 Presence of other vascular implants and grafts: Secondary | ICD-10-CM

## 2017-06-24 DIAGNOSIS — Z5111 Encounter for antineoplastic chemotherapy: Secondary | ICD-10-CM

## 2017-06-24 DIAGNOSIS — D500 Iron deficiency anemia secondary to blood loss (chronic): Secondary | ICD-10-CM

## 2017-06-24 DIAGNOSIS — Z7189 Other specified counseling: Secondary | ICD-10-CM

## 2017-06-24 DIAGNOSIS — C189 Malignant neoplasm of colon, unspecified: Secondary | ICD-10-CM

## 2017-06-24 DIAGNOSIS — T451X5A Adverse effect of antineoplastic and immunosuppressive drugs, initial encounter: Secondary | ICD-10-CM

## 2017-06-24 DIAGNOSIS — D696 Thrombocytopenia, unspecified: Secondary | ICD-10-CM

## 2017-06-24 LAB — CBC & DIFF AND RETIC
BASO%: 0.6 % (ref 0.0–2.0)
Basophils Absolute: 0 10*3/uL (ref 0.0–0.1)
EOS%: 4.4 % (ref 0.0–7.0)
Eosinophils Absolute: 0.2 10*3/uL (ref 0.0–0.5)
HCT: 44.4 % (ref 38.4–49.9)
HGB: 14.8 g/dL (ref 13.0–17.1)
Immature Retic Fract: 3.7 % (ref 3.00–10.60)
LYMPH#: 0.8 10*3/uL — AB (ref 0.9–3.3)
LYMPH%: 15.1 % (ref 14.0–49.0)
MCH: 32 pg (ref 27.2–33.4)
MCHC: 33.3 g/dL (ref 32.0–36.0)
MCV: 96.1 fL (ref 79.3–98.0)
MONO#: 0.6 10*3/uL (ref 0.1–0.9)
MONO%: 12.4 % (ref 0.0–14.0)
NEUT#: 3.4 10*3/uL (ref 1.5–6.5)
NEUT%: 67.5 % (ref 39.0–75.0)
PLATELETS: 101 10*3/uL — AB (ref 140–400)
RBC: 4.62 10*6/uL (ref 4.20–5.82)
RDW: 14.7 % — ABNORMAL HIGH (ref 11.0–14.6)
RETIC CT ABS: 52.21 10*3/uL (ref 34.80–93.90)
Retic %: 1.13 % (ref 0.80–1.80)
WBC: 5 10*3/uL (ref 4.0–10.3)

## 2017-06-24 LAB — COMPREHENSIVE METABOLIC PANEL
ALT: 45 U/L (ref 0–55)
ANION GAP: 12 meq/L — AB (ref 3–11)
AST: 37 U/L — ABNORMAL HIGH (ref 5–34)
Albumin: 4 g/dL (ref 3.5–5.0)
Alkaline Phosphatase: 147 U/L (ref 40–150)
BUN: 7.1 mg/dL (ref 7.0–26.0)
CHLORIDE: 105 meq/L (ref 98–109)
CO2: 22 meq/L (ref 22–29)
Calcium: 9.2 mg/dL (ref 8.4–10.4)
Creatinine: 0.9 mg/dL (ref 0.7–1.3)
Glucose: 234 mg/dl — ABNORMAL HIGH (ref 70–140)
POTASSIUM: 4.2 meq/L (ref 3.5–5.1)
Sodium: 139 mEq/L (ref 136–145)
Total Bilirubin: 0.97 mg/dL (ref 0.20–1.20)
Total Protein: 7.2 g/dL (ref 6.4–8.3)

## 2017-06-24 MED ORDER — LEUCOVORIN CALCIUM INJECTION 350 MG
400.0000 mg/m2 | Freq: Once | INTRAVENOUS | Status: AC
  Administered 2017-06-24: 964 mg via INTRAVENOUS
  Filled 2017-06-24: qty 48.2

## 2017-06-24 MED ORDER — SODIUM CHLORIDE 0.9% FLUSH
10.0000 mL | INTRAVENOUS | Status: DC | PRN
  Administered 2017-06-24: 10 mL via INTRAVENOUS
  Filled 2017-06-24: qty 10

## 2017-06-24 MED ORDER — PALONOSETRON HCL INJECTION 0.25 MG/5ML
0.2500 mg | Freq: Once | INTRAVENOUS | Status: AC
  Administered 2017-06-24: 0.25 mg via INTRAVENOUS

## 2017-06-24 MED ORDER — PALONOSETRON HCL INJECTION 0.25 MG/5ML
INTRAVENOUS | Status: AC
  Filled 2017-06-24: qty 5

## 2017-06-24 MED ORDER — SODIUM CHLORIDE 0.9 % IV SOLN
2400.0000 mg/m2 | INTRAVENOUS | Status: DC
  Administered 2017-06-24: 5800 mg via INTRAVENOUS
  Filled 2017-06-24: qty 116

## 2017-06-24 MED ORDER — DEXAMETHASONE SODIUM PHOSPHATE 10 MG/ML IJ SOLN
INTRAMUSCULAR | Status: AC
  Filled 2017-06-24: qty 1

## 2017-06-24 MED ORDER — DEXAMETHASONE SODIUM PHOSPHATE 10 MG/ML IJ SOLN
10.0000 mg | Freq: Once | INTRAMUSCULAR | Status: AC
  Administered 2017-06-24: 10 mg via INTRAVENOUS

## 2017-06-24 NOTE — Telephone Encounter (Signed)
Pt made aware that scheduling message sent to add doctor visit to scheduled days of 1/3 and 1/31. FMLA documentation completed and faxed to Domenic Polite, RN with [place]. Confirmed fax receipt [phone].

## 2017-06-24 NOTE — Progress Notes (Signed)
[name]    HEMATOLOGY/ONCOLOGY CLINIC NOTE  Date of Service: 06/24/17  Patient Care Team: [name], MD as PCP - General (Family Medicine) [name] M.D. 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 KRAS mutated BRAF, NRAS mutation neg MSI Stable.  PET/CT scan done on 06/25/2016 Show multiple foci of hypermetabolic hepatic metastases (atleast 4-5) and 2 hypermetabolic peritoneal nodules along the dorsal peritoneal surface concerning for peritoneal metastases.  MRI Liver 07/27/2016 - Multiple small liver metastases throughout the right and left hepatic lobes, largest measuring 2.1 cm. 1.2 cm enhancing peritoneal nodule in left paracolic gutter, suspicious for peritoneal metastasis. Other small peritoneal nodules better visualized on recent PET-CT which showed hypermetabolic activity, also suspicious for peritoneal metastases.   CT CHEST WO CONTRAST 10/12/2016  [facility] Result Impression   1. Congenital variant of bilateral middle lobes. 2. Groundglass opacification with regions of bronchiectasis in the right lower lobe adjacent to the fissure, consistent with inflammatory/infectious changes. 3. No definitive evidence of metastatic disease to the chest. 4. Probable sebaceous cyst in the subcutaneous tissues beneath the left anterior chest.   MR ABDOMEN AND PELVIS BTYOMAYO FOLLOW UP2/26/2018 [facility] Result Impression    Decreased size of multiple hepatic lesions and two peritoneal nodules compared to outside MRI 07/27/2016. No new lesions identified in  the abdomen or pelvis.     PREVIOUS TREATMENT  FOLFOX x 10 cycles. 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 (without 5FU bolus - due to thrombocytopenia). S/p Y-90 treatment to 1 hepatic lobe.  Plan for bilobar hepatic Y90 radio-embolization.  INTERVAL HISTORY  Patient is here for follow-up for his metastatic colon cancer and for cycle 24 of 5FU/leucovorin for maintenance therapy. Since we last saw him he reports that he has overall been doing well. He does reports that his dizziness and neuropathy have continued, but neither has worsened. His dizziness had improved completely but returned several weeks ago, each episode of this lasts for several seconds each time. He does note that vestibular PT never called him and he did not follow up with them after we referred him last time. He has scheduled MRI of the liver on 07/01/17. Additionally, he does report that he has been experiencing urinary hesitation, but he denies any straining or other urinary complaints.   On review of systems, he denies insomnia. He reports unchanged neuropathy. Pt denies fever, chills, rash, mouth sores, weight loss, decreased appetite. He does note some urinary hesitation, but he denies hematuria, dysuria, frequency, urgency. Denies pain. Pt denies abdominal pain, nausea, vomiting, melena, hematochezia, diarrhea, constipation.    MEDICAL HISTORY:  Past Medical History:  Diagnosis Date  . Anemia   . Arthritis   . Cancer ([place])   . Diabetes mellitus without complication (HCC)    diet controlled  . History of blood transfusion   . Hyperlipidemia   . Sleep apnea    cpap  . Wears glasses     SURGICAL HISTORY: Past Surgical History:  Procedure Laterality Date  .  COLON SURGERY    . IR GENERIC HISTORICAL  09/24/2016   IR CV LINE INJECTION 09/24/2016 [facility]  . IR  GENERIC HISTORICAL  09/24/2016   IR US GUIDE VASC ACCESS RIGHT 09/24/2016 [facility]  . IR GENERIC HISTORICAL  09/24/2016   IR FLUORO GUIDE CV LINE RIGHT 09/24/2016 [facility]  . IR GENERIC HISTORICAL  09/30/2016   IR FLUORO GUIDE PORT INSERTION RIGHT 09/30/2016 Markus Daft, MD [facility]  . IR GENERIC HISTORICAL  09/30/2016   IR US GUIDE VASC ACCESS RIGHT 09/30/2016 Markus Daft, MD [facility]  . IR GENERIC HISTORICAL  09/30/2016   IR REMOVAL TUN ACCESS W/ PORT W/O FL MOD SED 09/30/2016 Markus Daft, MD [facility]  . TESTICLE SURGERY     as teen  . TONSILLECTOMY    . WISDOM TOOTH EXTRACTION      SOCIAL HISTORY: Social History   Socioeconomic History  . Marital status: Married    Spouse name: Not on file  . Number of children: Not on file  . Years of education: Not on file  . Highest education level: Not on file  Social Needs  . Financial resource strain: Not on file  . Food insecurity - worry: Not on file  . Food insecurity - inability: Not on file  . Transportation needs - medical: Not on file  . Transportation needs - non-medical: Not on file  Occupational History  . Occupation: cemetery maintenance  Tobacco Use  . Smoking status: Former Smoker    Packs/day: 1.50    Years: 29.00    Pack years: 43.50    Types: Cigarettes    Last attempt to quit: 12/16/2012    Years since quitting: 4.5  . Smokeless tobacco: Never Used  . Tobacco comment: 1-2 ppd from age [age]y to 2014  Substance and Sexual Activity  . Alcohol use: Yes    Alcohol/week: 1.8 oz    Types: 3 Shots of liquor per week    Comment:  some days  . Drug use: No  . Sexual activity: Not on file  Other Topics Concern  . Not on file  Social History Narrative  . Not on file    FAMILY HISTORY: Family History  Problem Relation Age of Onset  . Diabetes Other   . Hyperlipidemia Other   . Hypertension Other   . Breast cancer Maternal Aunt 70  . Prostate cancer Maternal Uncle 75  . Lung cancer Paternal Uncle 56  .  Stroke Maternal Grandfather 72  . Cancer Paternal Grandmother 94       dx cancer of pancreas and colon, unknown if separate primaries  . Heart Problems Paternal Grandfather        d. 18  . Prostate cancer Maternal Uncle 41  . Breast cancer Maternal Aunt 75  . Breast cancer Maternal Aunt 68  . Cervical cancer Maternal Aunt 68  . Pancreatic cancer Maternal Aunt 54       d. 58y; heavy smoker  . Colon cancer Paternal Uncle 6       s/p partial colectomy; dx. second colon cancer at age 38-64    ALLERGIES:  is allergic to penicillins.  MEDICATIONS:  Current Outpatient Medications  Medication Sig Dispense Refill  . b complex vitamins tablet Take 1 tablet by mouth daily.    [name] dexamethasone (DECADRON) 4 MG tablet Take 2 tablets (8 mg total) by mouth daily. Start the day after chemotherapy for 2 days. Take with food. 30 tablet 0  .  DULoxetine (CYMBALTA) 60 MG capsule Take 1 capsule (60 mg total) by mouth daily. 30 capsule 1  . lidocaine-prilocaine (EMLA) cream Apply to affected area once 30 g 3  . LORazepam (ATIVAN) 0.5 MG tablet Take 1 tablet (0.5 mg total) by mouth every 8 (eight) hours. For anxiety or sleep 30 tablet 0  . Multiple Vitamin (MULTIVITAMIN) tablet Take 1 tablet by mouth daily.    . ondansetron (ZOFRAN) 8 MG tablet Take 1 tablet (8 mg total) by mouth 2 (two) times daily as needed for refractory nausea / vomiting. Start on day 3 after chemotherapy. 30 tablet 1  . Probiotic Product (PROBIOTIC PO) Take 1 tablet by mouth daily.    . prochlorperazine (COMPAZINE) 10 MG tablet Take 1 tablet (10 mg total) by mouth every 6 (six) hours as needed (Nausea or vomiting). 30 tablet 1  . pyridOXINE (VITAMIN B-6) 100 MG tablet Take 200 mg by mouth daily.    . traMADol (ULTRAM) 50 MG tablet Take 1 tablet (50 mg total) by mouth every 6 (six) hours as needed for moderate pain or severe pain. 60 tablet 0   No current facility-administered medications for this visit.     REVIEW OF SYSTEMS:    10  Point review of Systems was done is negative except as noted above.  PHYSICAL EXAMINATION:  ECOG PERFORMANCE STATUS: 1 - Symptomatic but completely ambulatory VS GENERAL:alert, in no acute distress and comfortable SKIN: skin color, texture, turgor are normal, no rashes or significant lesions EYES: normal, conjunctiva are pink and non-injected, sclera clear OROPHARYNX:no exudate, no erythema and lips, buccal mucosa, and tongue normal  NECK: supple, no JVD, thyroid normal size, non-tender, without nodularity LYMPH:  no palpable lymphadenopathy in the cervical, axillary or inguinal LUNGS: clear to auscultation with normal respiratory effort HEART: regular rate & rhythm,  no murmurs and no lower extremity edema ABDOMEN: abdomen soft, bowel sounds normoactive Musculoskeletal: no cyanosis of digits and no clubbing  PSYCH: alert & oriented x 3 with fluent speech NEURO: no focal motor/sensory deficits   LABORATORY DATA:  I have reviewed the data as listed  .[name] CBC Latest Ref Rng & Units 06/24/2017 06/10/2017 05/27/2017  WBC 4.0 - 10.3 10e3/uL 5.0 4.7 5.2  Hemoglobin 13.0 - 17.1 g/dL 14.8 14.5 14.8  Hematocrit 38.4 - 49.9 % 44.4 43.9 45.0  Platelets 140 - 400 10e3/uL 101(L) 93(L) 97(L)   . CMP Latest Ref Rng & Units 06/24/2017 06/10/2017 05/27/2017  Glucose 70 - 140 mg/dl 234(H) 233(H) 172(H)  BUN 7.0 - 26.0 mg/dL 7.1 9.1 9.5  Creatinine 0.7 - 1.3 mg/dL 0.9 0.8 0.9  Sodium 136 - 145 mEq/L 139 138 140  Potassium 3.5 - 5.1 mEq/L 4.2 4.1 4.2  Chloride 101 - 111 mmol/L - - -  CO2 22 - 29 mEq/L _0 Calcium 8.4 - 10.4 mg/dL 9.2 9.2 9.6  Total Protein 6.4 - 8.3 g/dL 7.2 6.7 7.0  Total Bilirubin 0.20 - 1.20 mg/dL 0.97 0.78 1.08  Alkaline Phos 40 - 150 U/L 147 140 142  AST 5 - 34 U/L 37(H) 39(H) 34  ALT 0 - 55 U/L 45 42 39        Microscopic Comment 2. COLON AND RECTUM (INCLUDING TRANS-ANAL RESECTION): Specimen: Terminal ileum, right colon and appendix Procedure: Segmental  resection Tumor site: Proximal ascending colon Specimen integrity: Intact Macroscopic intactness of mesorectum: Not applicable: x Complete: NA Near complete: NA Incomplete: NA Cannot be determined (specify): NA Macroscopic tumor perforation: The mass  invades through muscularis propria into pericolonic soft tissue Invasive tumor: Maximum size: 5.5 cm Histologic type(s): Adenocarcinoma Histologic grade and differentiation: G3 G1: well differentiated/low grade 1 of 4 Supplemental copy SUPPLEMENTAL for Tetro, Jes ([id number]) Microscopic Comment(continued) G2: moderately differentiated/low grade G3: poorly differentiated/high grade G4: undifferentiated/high grade Type of polyp in which invasive carcinoma arose: Tubular adenoma Microscopic extension of invasive tumor: The mass invade through the muscularis propria into pericolonic soft tissue Lymph-Vascular invasion: Identified Peri-neural invasion: Identified Tumor deposit(s) (discontinuous extramural extension): Present Resection margins: Proximal margin: Negative Distal margin: Negative Circumferential (radial) (posterior ascending, posterior descending; lateral and posterior mid-rectum; and entire lower 1/3 rectum):Negative Mesenteric margin (sigmoid and transverse): NA Distance closest margin (if all above margins negative): 3.7 cm from the non peritonealized pericolic soft tissue margin Trans-anal resection margins only: Deep margin: NA Mucosal Margin: NA Distance closest mucosal margin (if negative): NA Treatment effect (neo-adjuvant therapy): NA Additional polyp(s): Negative Non-neoplastic findings: Unremarkable Lymph nodes: number examined 18; number positive: 5 Pathologic Staging: pT3, N2a, M1a Ancillary studies: MSI ordered      RADIOGRAPHIC STUDIES: I have personally reviewed the radiological images as listed and agreed with the findings in the report. No results found. 2018 February MR ABDOMEN AND  PELVIS WWOTUMOR FOLLOW UP2/26/2018 [facility] Result Impression    Decreased size of multiple hepatic lesions and two peritoneal nodules compared to outside MRI 07/27/2016. No new lesions identified in the abdomen or pelvis.  Result Narrative  MR ABDOMEN AND PELVIS WITH AND WITHOUT CONTRAST (TUMOR FOLLOW-UP PROTOCOL), 10/12/2016 5:59 PM   INDICATION: liver and peritoneal colon cancer metastases \ C18.9 Metastatic colon cancer to liver (HCC) \ C78.7 Metastatic colon cancer to liver ([place]) \ C78.6 Peritoneal carcinomatosis ([place]) \ C80.1 Peritoneal carcinomatosis ([place])   ADDITIONAL HISTORY: History of stage IV colorectal cancer status post laparoscopic right hemicolectomy on 06/17/2016. At time of surgery, tumor nodule in the right lobe of the liver was positive on biopsy for adenocarcinoma. Patient currently undergoing chemotherapy.  COMPARISON: Outside studies including CT chest abdomen pelvis 05/23/2016, PET CT 07/15/2016, and MR abdomen 07/27/2016   TECHNIQUE: Multiplanar, multisequence MR images of the abdomen and pelvis were obtained before and after intravenous administration of gadolinium-based contrast.   FINDINGS:  LOWER CHEST Heart: Normal size. No pericardial effusion. Lungs/pleura: No masses, consolidations, or effusions.  ABDOMEN Liver: Liver is enlarged, measuring 20 cm in greatest craniocaudal dimension. Hepatic steatosis. Multiple T2 hyperintense and T1 iso to hypointense lesions throughout the liver as marked in series 16, decreased in size compared to outside MR 07/27/2016. Largest lesion is in segment 8 and measures 1.4 cm and demonstrates peripheral enhancement. No new lesion. Gallbladder: Normal. No stones or inflammatory changes. Biliary: No obstruction. Spleen: Spleen is mildly enlarged, measuring 13.8 cm in greatest length. Pancreas: Normal. Adrenals: Normal. Kidneys: Multiple T2 hyperintense nonenhancing lesions in the lower pole of the  kidneys bilaterally, consistent with cysts. No hydronephrosis. Stomach/bowel: Right hemicolectomy. Peritoneum: Decreased size of peritoneal nodule in the left paracolic gutter measuring 6 mm (series 10, image 45). Decreased size of peritoneal nodule in the left lower quadrant along the ventral abdominal wall measuring 8 mm (series 23, image 13). No apparent new measurable lesions. Extraperitoneum: Within normal limits. Vascular: Within normal limits.  PELVIS Ureters: Within normal limits. Bladder: Within normal limits. Reproductive system: Within normal limits.  MSK: Lower midline ventral abdominal wall scar.   CT CHEST WO CONTRAST2/26/2018 [facility] Result Impression   1. Congenital variant of  bilateral middle lobes. 2. Groundglass opacification with regions of bronchiectasis in the right lower lobe adjacent to the fissure, consistent with inflammatory/infectious changes. 3. No definitive evidence of metastatic disease to the chest. 4. Probable sebaceous cyst in the subcutaneous tissues beneath the left anterior chest.  Result Narrative  CT CHEST WO CONTRAST, 10/12/2016 3:24 PM  INDICATION:stage IV colon cancer \ C18.9 Metastatic colon cancer to liver ([place]) \ C78.7 Metastatic colon cancer to liver ([place]) \ C78.6 Peritoneal carcinomatosis ([place]) \ C80.1 Peritoneal carcinomatosis ([place])  COMPARISON: None  TECHNIQUE: Multislice axial images were obtained through the chest without administration of iodinated intravenous contrast material. Multi-planar reformatted images were generated for additional analysis. Nongated technique limits cardiac detail.  All CT scans at [facility] [place] are performed using dose optimization techniques as appropriate to a performed exam, including but not limited to one or more of the following: automated exposure control, adjustment of the mA and/or kV according to patient size, use of  iterative reconstruction technique. In addition, [place] is participating in the [place] program which will further assist [place] in optimizing patient radiation exposure.   FINDINGS:   Thoracic inlet/central airways: Parenchyma is within normal limits for age. The trachea is patent. Mediastinum/hila/axilla: Scattered, nonpathologically enlarged nodes. Heart/vessels: Normal heart size. Trace pericardial effusion. Coronary artery calcifications.. The tip of the central line is in the distal SVC. Lungs/pleura: There are bilateral middle lobes. Subtle groundglass opacification surrounding the posterior aspect of the right major fissure. This is seen in association with some bronchiectasis. Upper abdomen: See concurrent MRI study. Chest wall/MSK: Multi-segmented sternum. 1.5 cm low-density soft tissue nodule in the subcutaneous tissues beneath the left anterior chest.     MR LIVER WWO CONTRAST5/18/2018 [facility] Result Impression   1.When compared to the February 2018 MRI, there has been no interval change in the multiple T2 hyperintense lesions scattered throughout the liver compatible with metastases. When compared to the initial MRI performed December 2017, lesions have decreased in size. 2.No evidence of new hepatic or peritoneal lesions.    CT CHEST ABDOMEN PELVIS W CONTRAST (ROUTINE)01/01/2017 [facility] Result Impression   1. Most of the small hepatic lesions noted on prior MRI are not seen on today's CT, likely due to technical differences inherent to the modalities. The largest lesion noted in segment 8 on the prior examination is unchanged in size. 2. Tiny implant along the upper left paracolic gutter, quite similar to the February 2018 MRI. No ascites or new peritoneal lesions are otherwise evident. 3. Status post right hemicolectomy. 4. Hepatic steatosis. 5. Additional ancillary findings as above.      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 KRAS mutated BRAF, NRAS mutation neg MSI Stable.  PET/CT scan done on 06/25/2016 Show multiple foci of hypermetabolic hepatic metastases (atleast 4-5) and 2 hypermetabolic peritoneal nodules along the dorsal peritoneal surface concerning for peritoneal metastases.  MRI Liver 07/27/2016 - Multiple small liver metastases throughout the right and left hepatic lobes, largest measuring 2.1 cm. 1.2 cm enhancing peritoneal nodule in left paracolic gutter, suspicious for peritoneal metastasis. Other small peritoneal nodules better visualized on recent PET-CT which showed hypermetabolic activity, also suspicious for peritoneal metastases.   MRI Abd 10/12/2016 at [facility] --shows improvement in all  the liver and the 2 peritoneal lesions and no new lesions.  CEA level continues to improve.  MRI liver and CT C/A/P on 01/01/2017 as noted above showed improved/stable disease. -CEA level pending today (05/27/2017).   -Discussed with the patient and his wife regarding [place] to aid with patient increased drowsiness and fatigue following chemotherapy. The patient is optimistic to try this program and I will send a referral.    2) Mild thrombocytopenia PLT improved from 84k to 101k (already have held 5FU bolus dose).  -Platelets at 101k as of today, 06/24/2017. Platelets have continued to remain stable.  PLAN -no overt prohibitive toxicities from 5FU/leucovorin except mild thrombocytopenia which continues to improve. -continue 5FU/LV without bolus -Continue f/u with [facility] for serial hepatic Y90 radio-embolization for better control of hepatic metastatic disease. This will be decided based on rpt MRI abd at [facility] later this month. (follows with Dr Pecola Lawless IR  at [facility] for Y-90 treatments) -grade 1 neuropathy stable/improved--continue Cymbalta 60m po daily. -conitnue follow up with Dr. SMoreen Fowlerto optimize control of his diabetes. -continue maintenance 5FU/leucovorin q2weeks. Might need to switch to q3weeks if thrombocytopenia becomes limiting <75k or will cut down 5FU dose. -will continue maintenance 5FU/leucovorin for controlling peritoneal disease and may consider rad onc consultation for RT to residual peritoneal disease post liver directed therapies after completion of Y-90 hepatic embolization. -[facility] following his hepatic embolizations. He has follow up in his clinic following this.  -We will also order CT CAP prior to his next follow up with [facility] reevaluate the progression of his disease. If Dr KPecola Lawlessorders these I informed him to call [facility] that we can cancel our scans.  -If there is some residual disease we will consider Radiation Oncology follow up.  -He has experienced some BPPV recently, we will again refer him to vestibular physical therapy to help aid with this.  -We will also start him on Meclozine to try and help with this as well.  -CEA has remained wnl though gradually uptrending. -He did have a question about having his scheduled colonoscopy, I informed him that even though he has metastatic disease, his disease is well controled as of now I advised him to consider colonoscopic re-evaluation.   #2 Iron deficiency anemia due to GI bleeding from the tumor an some blood loss with surgery.  Iron def resolved. 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 Counseling  regarding advanced care planning and goals of care 05/27/2017  . Chemotherapy-induced neuropathy ([place] 11/18/2016  . Genetic testing 10/30/2016  . Port catheter in place 09/25/2016  . Family history of colon cancer 07/24/2016  . Family history of prostate cancer 07/24/2016  . Metastatic colon cancer to liver ([place] 07/01/2016  . Right Colon cancer metastasized to liver ([place] 06/17/2016  . Chronic GI bleeding 05/29/2016  . Diabetes mellitus type 2, diet-controlled ([place] 05/22/2016  . OSA (obstructive sleep apnea) 05/22/2016  . Hyperlipidemia 05/22/2016  . Anemia due to blood loss, chronic   . Antineoplastic chemotherapy induced pancytopenia (  CODE) ([place]) 05/21/2016  Plan:  -Continue follow-up with primary care physician [name], MD for optimization of diabetes management. Might need basal insulin if his blood sugars are elevated with chemotherapy/ steroids. -Continue use of CPAP for sleep apnea.   -continue 5FE/Leucovorin q2weeks- please schedule next 4 cycles -Referral to [place] for vestibular PT  -CT CAP in 3 weeks with labs -RTC with Dr Irene Limbo in 4 weeks with labs -Labs with each chemotherapy   All of the patients questions were answered with apparent satisfaction. The patient knows to call the clinic with any problems, questions or concerns.  I spent 20 minutes counseling the patient face to face. The total time spent in the appointment was 25 minutes and more than 50% was on counseling and direct patient cares.    Sullivan Lone MD [place] AAHIVMS [facility] Hematology/Oncology Physician [place]  (Office):       [phone] (Work cell):  [phone] (Fax):           [phone]  This document serves as a record of services personally performed by Sullivan Lone, MD. It was created on his behalf by Reola Mosher, a trained medical scribe. The creation of this record is based on the scribe's personal observations and the provider's statements to them.  This document has been checked and approved by the attending provider.

## 2017-06-24 NOTE — Patient Instructions (Signed)
[facility] Discharge Instructions for Patients Receiving Chemotherapy  Today you received the following chemotherapy agents Leucovorin and 5FU  To help prevent nausea and vomiting after your treatment, we encourage you to take your nausea medication as directed If you develop nausea and vomiting that is not controlled by your nausea medication, call the clinic.   BELOW ARE SYMPTOMS THAT SHOULD BE REPORTED IMMEDIATELY:  *FEVER GREATER THAN 100.5 F  *CHILLS WITH OR WITHOUT FEVER  NAUSEA AND VOMITING THAT IS NOT CONTROLLED WITH YOUR NAUSEA MEDICATION  *UNUSUAL SHORTNESS OF BREATH  *UNUSUAL BRUISING OR BLEEDING  TENDERNESS IN MOUTH AND THROAT WITH OR WITHOUT PRESENCE OF ULCERS  *URINARY PROBLEMS  *BOWEL PROBLEMS  UNUSUAL RASH Items with * indicate a potential emergency and should be followed up as soon as possible.  Feel free to call the clinic should you have any questions or concerns. The clinic phone number is [phone].  Please show the CHEMO ALERT CARD at check-in to the Emergency Department and triage nurse.   

## 2017-06-24 NOTE — Telephone Encounter (Signed)
Gave avs and calendar for December - February 2019 °

## 2017-06-24 NOTE — Telephone Encounter (Signed)
Called to schedule ref however they will contact the patient later today.

## 2017-06-25 ENCOUNTER — Encounter: Payer: Self-pay | Admitting: Physical Therapy

## 2017-06-25 ENCOUNTER — Ambulatory Visit: Payer: 59 | Attending: Hematology | Admitting: Physical Therapy

## 2017-06-25 ENCOUNTER — Other Ambulatory Visit: Payer: Self-pay | Admitting: Medical Oncology

## 2017-06-25 DIAGNOSIS — R42 Dizziness and giddiness: Secondary | ICD-10-CM | POA: Insufficient documentation

## 2017-06-25 DIAGNOSIS — H8111 Benign paroxysmal vertigo, right ear: Secondary | ICD-10-CM | POA: Insufficient documentation

## 2017-06-25 NOTE — Therapy (Signed)
[facility] [address] [place], [place], [address] Phone: [phone]   Fax:  [phone]  Physical Therapy Evaluation  Patient Details  Name: Scott Gallagher MRN: [id number] Date of Birth: 01/11/1971 Referring Provider: Toma Copier, MD    Encounter Date: 06/25/2017  PT End of Session - 06/25/17 1454    Visit Number  1    Number of Visits  4    Date for PT Re-Evaluation  07/25/17    Authorization Type  [employer]    PT Start Time  1400    PT Stop Time  1442    PT Time Calculation (min)  42 min    Activity Tolerance  Patient tolerated treatment well    Behavior During Therapy  [facility] for tasks assessed/performed       Past Medical History:  Diagnosis Date  . Anemia   . Arthritis   . Cancer ([place])   . Diabetes mellitus without complication (HCC)    diet controlled  . History of blood transfusion   . Hyperlipidemia   . Sleep apnea    cpap  . Wears glasses     Past Surgical History:  Procedure Laterality Date  . COLON SURGERY    . IR GENERIC HISTORICAL  09/24/2016   IR CV LINE INJECTION 09/24/2016 [facility]  . IR GENERIC HISTORICAL  09/24/2016   IR US GUIDE VASC ACCESS RIGHT 09/24/2016 [facility]  . IR GENERIC HISTORICAL  09/24/2016   IR FLUORO GUIDE CV LINE RIGHT 09/24/2016 [facility]  . IR GENERIC HISTORICAL  09/30/2016   IR FLUORO GUIDE PORT INSERTION RIGHT 09/30/2016 Markus Daft, MD [facility]  . IR GENERIC HISTORICAL  09/30/2016   IR US GUIDE VASC ACCESS RIGHT 09/30/2016 Markus Daft, MD [facility]  . IR GENERIC HISTORICAL  09/30/2016   IR REMOVAL TUN ACCESS W/ PORT W/O FL MOD SED 09/30/2016 Markus Daft, MD [facility]  . TESTICLE SURGERY     as teen  . TONSILLECTOMY    . WISDOM TOOTH EXTRACTION      There were no vitals filed for this visit.   Subjective Assessment - 06/25/17 1411    Subjective  Pt reports first episode of dizziness while lying in bed-rolled onto his back and the room  started to spin-lasted a few seconds to one minute.  Resolved spontaneously.  Has had 3 acute, spontaneous episodes of dizziness since first episode.  Denies nausea and vomiting, denies changes in vision or hearing, denies tinnitus or headache.    Patient Stated Goals  Stop being dizzy    Currently in Pain?  No/denies         [facility] PT Assessment - 06/25/17 1414      Assessment   Medical Diagnosis  Vertigo    Referring Provider  Toma Copier, MD     Onset Date/Surgical Date  06/24/17    Hand Dominance  Right    Prior Therapy  yes, for his neck and eval only with PT for exercise conditioning and balance      Precautions   Precautions  Other (comment)    Precaution Comments  colon CA with resection in 2017 now with metastasis to the liver; pt undergoing chemo and radioactive beads in liver, anemia, arthritis, DM, chemo induced peripheral neuropathy, hyperlipidemia, neck pain and sleep apnea      Balance Screen   Has the patient fallen in the past 6 months  No    How many  times?  no falls due to dizziness    Has the patient had a decrease in activity level because of a fear of falling?   No    Is the patient reluctant to leave their home because of a fear of falling?   No      Home Film/video editor residence    Living Arrangements  Spouse/significant other    Type of [place] to enter    Entrance Stairs-Number of Steps  12    Entrance Stairs-Rails  Right    [place]  One level      Prior Function   Level of Independence  Independent    Vocation  Full time employment    Vocation Requirements  is a Photographer, with equipment and some by hand; also mows, landscapes      Observation/Other Assessments   Focus on Therapeutic Outcomes (FOTO)   86 (14% limited; predicted 12% limitation by D/C)      Sensation   Light Touch  Impaired Detail    Light Touch Impaired Details  Impaired RUE;Impaired LUE;Impaired  RLE;Impaired LLE    Additional Comments  peripheral neuropathy due to chemo         Vestibular Assessment - 06/25/17 1418      Vestibular Assessment   General Observation  Chemotherapy port R side      Symptom Behavior   Type of Dizziness  Spinning    Frequency of Dizziness  daily    Duration of Dizziness  seconds to one minute    Aggravating Factors  Looking up to the ceiling;Rolling to right;Rolling to left;Forward bending    Relieving Factors  Head stationary      Occulomotor Exam   Occulomotor Alignment  Normal    Spontaneous  Absent    Gaze-induced  Absent    Smooth Pursuits  Intact    Saccades  Intact    Comment  convergence intact      Vestibulo-Occular Reflex   VOR to Slow Head Movement  Normal    Comment  HIT: negative bilaterally      Positional Testing   Dix-Hallpike  Dix-Hallpike Right;Dix-Hallpike Left    Horizontal Canal Testing  Horizontal Canal Right;Horizontal Canal Left      Dix-Hallpike Right   Dix-Hallpike Right Duration  10 seconds    Dix-Hallpike Right Symptoms  Upbeat, right rotatory nystagmus      Dix-Hallpike Left   Dix-Hallpike Left Duration  0    Dix-Hallpike Left Symptoms  No nystagmus      Horizontal Canal Right   Horizontal Canal Right Duration  0    Horizontal Canal Right Symptoms  Normal      Horizontal Canal Left   Horizontal Canal Left Duration  0    Horizontal Canal Left Symptoms  Normal         Objective measurements completed on examination: See above findings.       Vestibular Treatment/Exercise - 06/25/17 1436      Vestibular Treatment/Exercise   Vestibular Treatment Provided  Canalith Repositioning    Canalith Repositioning  Epley Manuever Right       EPLEY MANUEVER RIGHT   Number of Reps   2    Overall Response  Improved Symptoms            PT Education - 06/25/17 1454    Education provided  Yes    Education Details  clinical findings, BPPV, PT POC and goals    Person(s) Educated  Patient     Methods  Explanation    Comprehension  Verbalized understanding          PT Long Term Goals - 06/25/17 1501      PT LONG TERM GOAL #1   Title  Pt will demonstrate negative positional testing [employer]) and report  0/10 vertigo during bed mobility, bending down and looking up    Time  4    Period  Weeks    Status  New    Target Date  07/25/17      PT LONG TERM GOAL #2   Title  Pt will demonstrate independence with home canalith repositioning manuever    Time  4    Period  Weeks    Status  New    Target Date  07/25/17      PT LONG TERM GOAL #3   Title  Pt will report 5 point increase in overall function on FOTO    Baseline  86% function    Time  4    Period  Weeks    Status  New    Target Date  07/25/17        Long Term Clinic Goals - 06/15/17 1748      CC Long Term Goal  #1   Title  Pt. will be knowledgeable about benefits of exercise, including benefits related to cancer diagnosis.    Status  Achieved          Plan - 06/25/17 1455    Clinical Impression Statement  Pt is a 46 year old male presenting to OPPT neuro for evaluation of vertigo.  Pt's PMH significant for the following: colon CA with resection in 2017 now with metastasis to the liver; pt undergoing chemo and radioactive beads in liver, chemo induced peripheral neuropathy, anemia, arthritis, DM, hyperlipidemia, neck pain and sleep apnea.  Pt currently in PT for neck pain.  Pt also evaluated by [facility] for balance due to peripheral neuropathy and exercise conditioning but was seen for one time evaluation only.  The following deficits were noted during pt's exam: vertigo with recurring R posterior canal canalithiasis. Pt would benefit from skilled PT to address these impairments and functional limitations to maximize functional mobility independence and reduce falls risk.    History and Personal Factors relevant to plan of care:  colon CA with resection in 2017 now with metastasis to the liver; pt undergoing  chemo and radioactive beads in liver, chemo induced peripheral neuropathy, anemia, arthritis, DM, hyperlipidemia, neck pain and sleep apnea    Clinical Presentation  Evolving    Clinical Presentation due to:  BPPV is recurring, colon CA with resection in 2017 now with metastasis to the liver; pt undergoing chemo and radioactive beads in liver, chemo induced peripheral neuropathy, anemia, arthritis, DM, hyperlipidemia, neck pain and sleep apnea    Clinical Decision Making  Moderate    Rehab Potential  Excellent    PT Frequency  1x / week    PT Duration  4 weeks    PT Treatment/Interventions  Patient/family education;ADLs/Self Care Home Management;Canalith Repostioning;Neuromuscular re-education;Vestibular    PT Next Visit Plan  treat R posterior canal canalithiasis; teach home Epley maneuver    Consulted and Agree with Plan of Care  Patient       Patient will benefit from skilled therapeutic intervention in order to improve the following deficits and impairments:  Impaired sensation,  Dizziness, Pain  Visit Diagnosis: BPPV (benign paroxysmal positional vertigo), right  Dizziness and giddiness     Problem List Patient Active Problem List   Diagnosis Date Noted  . Counseling regarding advanced care planning and goals of care 05/27/2017  . Chemotherapy-induced neuropathy ([place]) 11/18/2016  . Genetic testing 10/30/2016  . Port catheter in place 09/25/2016  . Family history of colon cancer 07/24/2016  . Family history of prostate cancer 07/24/2016  . Metastatic colon cancer to liver ([place]) 07/01/2016  . Right Colon cancer metastasized to liver ([place]) 06/17/2016  . Chronic GI bleeding 05/29/2016  . Diabetes mellitus type 2, diet-controlled ([place]) 05/22/2016  . OSA (obstructive sleep apnea) 05/22/2016  . Hyperlipidemia 05/22/2016  . Anemia due to blood loss, chronic   . Antineoplastic chemotherapy induced pancytopenia (CODE) ([place]) 05/21/2016    Rico Junker, PT, DPT 06/25/17    3:06  PM    [facility] [address] [place], [place], [address] Phone: [phone]   Fax:  [phone]  Name: Scott Gallagher MRN: [id number] Date of Birth: 11-Jul-1971

## 2017-06-26 ENCOUNTER — Ambulatory Visit (HOSPITAL_BASED_OUTPATIENT_CLINIC_OR_DEPARTMENT_OTHER): Payer: 59

## 2017-06-26 VITALS — BP 142/75 | HR 86 | Temp 98.1°F | Resp 18

## 2017-06-26 DIAGNOSIS — C189 Malignant neoplasm of colon, unspecified: Secondary | ICD-10-CM | POA: Diagnosis not present

## 2017-06-26 DIAGNOSIS — Z7189 Other specified counseling: Secondary | ICD-10-CM

## 2017-06-26 DIAGNOSIS — C787 Secondary malignant neoplasm of liver and intrahepatic bile duct: Secondary | ICD-10-CM

## 2017-06-26 MED ORDER — HEPARIN SOD (PORK) LOCK FLUSH 100 UNIT/ML IV SOLN
500.0000 [IU] | Freq: Once | INTRAVENOUS | Status: AC | PRN
  Administered 2017-06-26: 500 [IU]
  Filled 2017-06-26: qty 5

## 2017-06-26 MED ORDER — SODIUM CHLORIDE 0.9% FLUSH
10.0000 mL | INTRAVENOUS | Status: DC | PRN
  Administered 2017-06-26: 10 mL
  Filled 2017-06-26: qty 10

## 2017-06-26 NOTE — Patient Instructions (Signed)
[facility] Discharge Instructions for Patients Receiving Chemotherapy  Today you received the following chemotherapy agents 5fu To help prevent nausea and vomiting after your treatment, we encourage you to take your nausea medication as prescribed.  If you develop nausea and vomiting that is not controlled by your nausea medication, call the clinic.   BELOW ARE SYMPTOMS THAT SHOULD BE REPORTED IMMEDIATELY:  *FEVER GREATER THAN 100.5 F  *CHILLS WITH OR WITHOUT FEVER  NAUSEA AND VOMITING THAT IS NOT CONTROLLED WITH YOUR NAUSEA MEDICATION  *UNUSUAL SHORTNESS OF BREATH  *UNUSUAL BRUISING OR BLEEDING  TENDERNESS IN MOUTH AND THROAT WITH OR WITHOUT PRESENCE OF ULCERS  *URINARY PROBLEMS  *BOWEL PROBLEMS  UNUSUAL RASH Items with * indicate a potential emergency and should be followed up as soon as possible.  Feel free to call the clinic should you have any questions or concerns. The clinic phone number is [phone].  Please show the CHEMO ALERT CARD at check-in to the Emergency Department and triage nurse.   

## 2017-06-30 ENCOUNTER — Ambulatory Visit: Payer: 59 | Admitting: Physical Therapy

## 2017-06-30 DIAGNOSIS — R42 Dizziness and giddiness: Secondary | ICD-10-CM

## 2017-06-30 DIAGNOSIS — H8111 Benign paroxysmal vertigo, right ear: Secondary | ICD-10-CM | POA: Diagnosis not present

## 2017-06-30 DIAGNOSIS — M50322 Other cervical disc degeneration at C5-C6 level: Secondary | ICD-10-CM | POA: Diagnosis not present

## 2017-06-30 NOTE — Therapy (Signed)
[facility] [address] [place], [place], [address] Phone: [phone]   Fax:  [phone]  Physical Therapy Treatment  Patient Details  Name: Marke Goodwyn MRN: [id number] Date of Birth: August 07, 1971 Referring Provider: Toma Copier, MD    Encounter Date: 06/30/2017  PT End of Session - 06/30/17 1343    Visit Number  2    Number of Visits  4    Date for PT Re-Evaluation  07/25/17    Authorization Type  [employer]    PT Start Time  1330    PT Stop Time  1345    PT Time Calculation (min)  15 min    Activity Tolerance  Patient tolerated treatment well    Behavior During Therapy  [facility] for tasks assessed/performed       Past Medical History:  Diagnosis Date  . Anemia   . Arthritis   . Cancer ([place])   . Diabetes mellitus without complication (HCC)    diet controlled  . History of blood transfusion   . Hyperlipidemia   . Sleep apnea    cpap  . Wears glasses     Past Surgical History:  Procedure Laterality Date  . COLON SURGERY    . IR GENERIC HISTORICAL  09/24/2016   IR CV LINE INJECTION 09/24/2016 [facility]  . IR GENERIC HISTORICAL  09/24/2016   IR US GUIDE VASC ACCESS RIGHT 09/24/2016 [facility]  . IR GENERIC HISTORICAL  09/24/2016   IR FLUORO GUIDE CV LINE RIGHT 09/24/2016 [facility]  . IR GENERIC HISTORICAL  09/30/2016   IR FLUORO GUIDE PORT INSERTION RIGHT 09/30/2016 Markus Daft, MD [facility]  . IR GENERIC HISTORICAL  09/30/2016   IR US GUIDE VASC ACCESS RIGHT 09/30/2016 Markus Daft, MD [facility]  . IR GENERIC HISTORICAL  09/30/2016   IR REMOVAL TUN ACCESS W/ PORT W/O FL MOD SED 09/30/2016 Markus Daft, MD [facility]  . TESTICLE SURGERY     as teen  . TONSILLECTOMY    . WISDOM TOOTH EXTRACTION      There were no vitals filed for this visit.  Subjective Assessment - 06/30/17 1330    Subjective  Pt reports feeling really good, no episodes of dizziness after evaluation, even when lying  down in the bed.    Patient Stated Goals  Stop being dizzy             Vestibular Assessment - 06/30/17 1343      Positional Testing   Dix-Hallpike  Dix-Hallpike Right;Dix-Hallpike Left      Dix-Hallpike Right   Dix-Hallpike Right Duration  0    Dix-Hallpike Right Symptoms  No nystagmus      Dix-Hallpike Left   Dix-Hallpike Left Duration  0    Dix-Hallpike Left Symptoms  No nystagmus               Vestibular Treatment/Exercise - 06/30/17 1343      Vestibular Treatment/Exercise   Vestibular Treatment Provided  Canalith Repositioning    Canalith Repositioning  Epley Manuever Right       EPLEY MANUEVER RIGHT   Number of Reps   1    Overall Response  Symptoms Resolved    Response Details   Performed modified CRM with use of pillow behind mid back to teach pt how to perform home treatment if BPPV returns.  No vertigo or nystagmus noted today so CRM performed for educational purposes only today  How to Perform the Epley Maneuver The Epley maneuver is an exercise that relieves symptoms of vertigo. Vertigo is the feeling that you or your surroundings are moving when they are not. When you feel vertigo, you may feel like the room is spinning and have trouble walking. Dizziness is a little different than vertigo. When you are dizzy, you may feel unsteady or light-headed. You can do this maneuver at home whenever you have symptoms of vertigo. You can do it up to 3 times a day until your symptoms go away. Even though the Epley maneuver may relieve your vertigo for a few weeks, it is possible that your symptoms will return. This maneuver relieves vertigo, but it does not relieve dizziness. What are the risks? If it is done correctly, the Epley maneuver is considered safe. Sometimes it can lead to dizziness or nausea that goes away after a short time. If you develop other symptoms, such as changes in vision, weakness, or numbness, stop doing the maneuver and call your health  care provider. How to perform the Epley maneuver 1. Sit on the edge of a bed or table with your back straight and your legs extended or hanging over the edge of the bed or table. 2. Turn your head halfway toward the affected ear or side. 3. Lie backward quickly with your head turned until you are lying flat on your back. You may want to position a pillow under your shoulders. 4. Hold this position for 30 seconds. You may experience an attack of vertigo. This is normal. 5. Turn your head to the opposite direction until your unaffected ear is facing the floor. 6. Hold this position for 30 seconds. You may experience an attack of vertigo. This is normal. Hold this position until the vertigo stops. 7. Turn your whole body to the same side as your head. Hold for another 30 seconds. 8. Sit back up. You can repeat this exercise up to 3 times a day. Follow these instructions at home:  After doing the Epley maneuver, you can return to your normal activities.  Ask your health care provider if there is anything you should do at home to prevent vertigo. He or she may recommend that you: ? Keep your head raised (elevated) with two or more pillows while you sleep. ? Do not sleep on the side of your affected ear. ? Get up slowly from bed. ? Avoid sudden movements during the day. ? Avoid extreme head movement, like looking up or bending over. Contact a health care provider if:  Your vertigo gets worse.  You have other symptoms, including: ? Nausea. ? Vomiting. ? Headache. Get help right away if:  You have vision changes.  You have a severe or worsening headache or neck pain.  You cannot stop vomiting.  You have new numbness or weakness in any part of your body. Summary  Vertigo is the feeling that you or your surroundings are moving when they are not.  The Epley maneuver is an exercise that relieves symptoms of vertigo.  If the Epley maneuver is done correctly, it is considered safe. You  can do it up to 3 times a day. This information is not intended to replace advice given to you by your health care provider. Make sure you discuss any questions you have with your health care provider. Document Released: 08/08/2013 Document Revised: 06/23/2016 Document Reviewed: 06/23/2016 Elsevier Interactive Patient Education  2017 [employer].  PT Education - 06/30/17 1342    Education provided  Yes    Education Details  home epley maneuver    Person(s) Educated  Patient    Methods  Explanation;Demonstration;Handout    Comprehension  Verbalized understanding;Returned demonstration          PT Long Term Goals - 06/30/17 1346      PT LONG TERM GOAL #1   Title  Pt will demonstrate negative positional testing [employer]) and report  0/10 vertigo during bed mobility, bending down and looking up    Time  4    Period  Weeks    Status  Achieved      PT LONG TERM GOAL #2   Title  Pt will demonstrate independence with home canalith repositioning manuever    Time  4    Period  Weeks    Status  Achieved      PT LONG TERM GOAL #3   Title  Pt will report 5 point increase in overall function on FOTO    Baseline  86% function    Time  4    Period  Weeks    Status  Deferred        [facility] Goals - 06/15/17 1748      CC Long Term Goal  #1   Title  Pt. will be knowledgeable about benefits of exercise, including benefits related to cancer diagnosis.    Status  Achieved         Plan - 06/30/17 1345    Clinical Impression Statement  Pt demonstrates complete resolution of positional vertigo and nystagmus.  Educated pt on performance of home Epley maneuver with use of pillow behind his back; pt return demonstrated.  Discussed precautions with pt and indications to return to MD or PT.      Rehab Potential  Excellent    PT Frequency  1x / week    PT Duration  4 weeks    PT Treatment/Interventions  Patient/family education;ADLs/Self Care Home  Management;Canalith Repostioning;Neuromuscular re-education;Vestibular    PT Next Visit Plan  if pt does not return, D/C    Consulted and Agree with Plan of Care  Patient       Patient will benefit from skilled therapeutic intervention in order to improve the following deficits and impairments:  Impaired sensation, Dizziness, Pain  Visit Diagnosis: BPPV (benign paroxysmal positional vertigo), right  Dizziness and giddiness     Problem List Patient Active Problem List   Diagnosis Date Noted  . Counseling regarding advanced care planning and goals of care 05/27/2017  . Chemotherapy-induced neuropathy ([place]) 11/18/2016  . Genetic testing 10/30/2016  . Port catheter in place 09/25/2016  . Family history of colon cancer 07/24/2016  . Family history of prostate cancer 07/24/2016  . Metastatic colon cancer to liver ([place]) 07/01/2016  . Right Colon cancer metastasized to liver ([place]) 06/17/2016  . Chronic GI bleeding 05/29/2016  . Diabetes mellitus type 2, diet-controlled ([place]) 05/22/2016  . OSA (obstructive sleep apnea) 05/22/2016  . Hyperlipidemia 05/22/2016  . Anemia due to blood loss, chronic   . Antineoplastic chemotherapy induced pancytopenia (CODE) ([place]) 05/21/2016   Rico Junker, PT, DPT 06/30/17    1:49 PM    [facility] [address] [place], [place], [address] Phone: [phone]   Fax:  [phone]  Name: Yosef Krogh MRN: [id number] Date of Birth: 01-18-1971

## 2017-06-30 NOTE — Patient Instructions (Signed)
How to Perform the Epley Maneuver The Epley maneuver is an exercise that relieves symptoms of vertigo. Vertigo is the feeling that you or your surroundings are moving when they are not. When you feel vertigo, you may feel like the room is spinning and have trouble walking. Dizziness is a little different than vertigo. When you are dizzy, you may feel unsteady or light-headed. You can do this maneuver at home whenever you have symptoms of vertigo. You can do it up to 3 times a day until your symptoms go away. Even though the Epley maneuver may relieve your vertigo for a few weeks, it is possible that your symptoms will return. This maneuver relieves vertigo, but it does not relieve dizziness. What are the risks? If it is done correctly, the Epley maneuver is considered safe. Sometimes it can lead to dizziness or nausea that goes away after a short time. If you develop other symptoms, such as changes in vision, weakness, or numbness, stop doing the maneuver and call your health care provider. How to perform the Epley maneuver 1. Sit on the edge of a bed or table with your back straight and your legs extended or hanging over the edge of the bed or table. 2. Turn your head halfway toward the affected ear or side. Right side 3. Lie backward quickly with your head turned until you are lying flat on your back. You may want to position a pillow under your shoulders. 4. Hold this position for 30 seconds. You may experience an attack of vertigo. This is normal. 5. Turn your head to the opposite direction (left) until your unaffected ear is facing the floor. 6. Hold this position for 30 seconds. You may experience an attack of vertigo. This is normal. Hold this position until the vertigo stops. 7. Turn your whole body to the same side as your head (left, looking down at bed). Hold for another 30 seconds. 8. Tuck your chin in to your chest 9. Sit back up. You can repeat this exercise up to 3 times a day. Follow  these instructions at home:  After doing the Epley maneuver, you can return to your normal activities.  Ask your health care provider if there is anything you should do at home to prevent vertigo. He or she may recommend that you: ? Keep your head raised (elevated) with two or more pillows while you sleep. ? Do not sleep on the side of your affected ear. ? Get up slowly from bed. ? Avoid sudden movements during the day. ? Avoid extreme head movement, like looking up or bending over. Contact a health care provider if:  Your vertigo gets worse.  You have other symptoms, including: ? Nausea. ? Vomiting. ? Headache. Get help right away if:  You have vision changes.  You have a severe or worsening headache or neck pain.  You cannot stop vomiting.  You have new numbness or weakness in any part of your body. Summary  Vertigo is the feeling that you or your surroundings are moving when they are not.  The Epley maneuver is an exercise that relieves symptoms of vertigo.  If the Epley maneuver is done correctly, it is considered safe. You can do it up to 3 times a day. This information is not intended to replace advice given to you by your health care provider. Make sure you discuss any questions you have with your health care provider. Document Released: 08/08/2013 Document Revised: 06/23/2016 Document Reviewed: 06/23/2016 Elsevier Interactive Patient  Education  2017 [employer].

## 2017-07-01 DIAGNOSIS — C787 Secondary malignant neoplasm of liver and intrahepatic bile duct: Secondary | ICD-10-CM | POA: Diagnosis not present

## 2017-07-01 DIAGNOSIS — C189 Malignant neoplasm of colon, unspecified: Secondary | ICD-10-CM | POA: Diagnosis not present

## 2017-07-05 DIAGNOSIS — M50322 Other cervical disc degeneration at C5-C6 level: Secondary | ICD-10-CM | POA: Diagnosis not present

## 2017-07-07 ENCOUNTER — Ambulatory Visit (HOSPITAL_BASED_OUTPATIENT_CLINIC_OR_DEPARTMENT_OTHER): Payer: 59

## 2017-07-07 ENCOUNTER — Other Ambulatory Visit (HOSPITAL_BASED_OUTPATIENT_CLINIC_OR_DEPARTMENT_OTHER): Payer: 59

## 2017-07-07 VITALS — BP 123/85 | HR 88 | Temp 98.3°F | Resp 17 | Ht 74.0 in | Wt 253.4 lb

## 2017-07-07 DIAGNOSIS — C787 Secondary malignant neoplasm of liver and intrahepatic bile duct: Secondary | ICD-10-CM

## 2017-07-07 DIAGNOSIS — D696 Thrombocytopenia, unspecified: Secondary | ICD-10-CM

## 2017-07-07 DIAGNOSIS — Z5111 Encounter for antineoplastic chemotherapy: Secondary | ICD-10-CM | POA: Diagnosis not present

## 2017-07-07 DIAGNOSIS — Z7189 Other specified counseling: Secondary | ICD-10-CM

## 2017-07-07 DIAGNOSIS — C786 Secondary malignant neoplasm of retroperitoneum and peritoneum: Secondary | ICD-10-CM

## 2017-07-07 DIAGNOSIS — C189 Malignant neoplasm of colon, unspecified: Secondary | ICD-10-CM | POA: Diagnosis not present

## 2017-07-07 LAB — RETICULOCYTES (CHCC)
Immature Retic Fract: 7.7 % (ref 3.00–10.60)
RBC: 4.5 10*6/uL (ref 4.20–5.82)
RETIC %: 1.06 % (ref 0.80–1.80)
Retic Ct Abs: 47.7 10*3/uL (ref 34.80–93.90)

## 2017-07-07 LAB — CBC WITH DIFFERENTIAL/PLATELET
BASO%: 0.4 % (ref 0.0–2.0)
BASOS ABS: 0 10*3/uL (ref 0.0–0.1)
EOS ABS: 0.2 10*3/uL (ref 0.0–0.5)
EOS%: 3.7 % (ref 0.0–7.0)
HEMATOCRIT: 42.6 % (ref 38.4–49.9)
HEMOGLOBIN: 14.4 g/dL (ref 13.0–17.1)
LYMPH%: 18.3 % (ref 14.0–49.0)
MCH: 31.9 pg (ref 27.2–33.4)
MCHC: 33.8 g/dL (ref 32.0–36.0)
MCV: 94.2 fL (ref 79.3–98.0)
MONO#: 0.5 10*3/uL (ref 0.1–0.9)
MONO%: 11 % (ref 0.0–14.0)
NEUT#: 3.1 10*3/uL (ref 1.5–6.5)
NEUT%: 66.6 % (ref 39.0–75.0)
PLATELETS: 90 10*3/uL — AB (ref 140–400)
RBC: 4.52 10*6/uL (ref 4.20–5.82)
RDW: 14.6 % (ref 11.0–14.6)
WBC: 4.7 10*3/uL (ref 4.0–10.3)
lymph#: 0.9 10*3/uL (ref 0.9–3.3)

## 2017-07-07 LAB — COMPREHENSIVE METABOLIC PANEL
ALT: 51 U/L (ref 0–55)
ANION GAP: 10 meq/L (ref 3–11)
AST: 37 U/L — AB (ref 5–34)
Albumin: 3.8 g/dL (ref 3.5–5.0)
Alkaline Phosphatase: 130 U/L (ref 40–150)
BUN: 6.8 mg/dL — AB (ref 7.0–26.0)
CHLORIDE: 104 meq/L (ref 98–109)
CO2: 24 meq/L (ref 22–29)
CREATININE: 0.9 mg/dL (ref 0.7–1.3)
Calcium: 9.2 mg/dL (ref 8.4–10.4)
EGFR: >60 mL/min/{1.73_m2} (ref >60)
Glucose: 239 mg/dl — ABNORMAL HIGH (ref 70–140)
Potassium: 4 mEq/L (ref 3.5–5.1)
Sodium: 138 mEq/L (ref 136–145)
Total Bilirubin: 1.01 mg/dL (ref 0.20–1.20)
Total Protein: 6.7 g/dL (ref 6.4–8.3)

## 2017-07-07 MED ORDER — PALONOSETRON HCL INJECTION 0.25 MG/5ML
0.2500 mg | Freq: Once | INTRAVENOUS | Status: AC
  Administered 2017-07-07: 0.25 mg via INTRAVENOUS

## 2017-07-07 MED ORDER — DEXTROSE 5 % IV SOLN
Freq: Once | INTRAVENOUS | Status: AC
  Administered 2017-07-07: 09:00:00 via INTRAVENOUS

## 2017-07-07 MED ORDER — SODIUM CHLORIDE 0.9 % IV SOLN
2400.0000 mg/m2 | INTRAVENOUS | Status: DC
  Administered 2017-07-07: 5800 mg via INTRAVENOUS
  Filled 2017-07-07: qty 116

## 2017-07-07 MED ORDER — SODIUM CHLORIDE 0.9 % IV SOLN
400.0000 mg/m2 | Freq: Once | INTRAVENOUS | Status: AC
  Administered 2017-07-07: 964 mg via INTRAVENOUS
  Filled 2017-07-07: qty 48.2

## 2017-07-07 MED ORDER — DEXAMETHASONE SODIUM PHOSPHATE 10 MG/ML IJ SOLN
INTRAMUSCULAR | Status: AC
  Filled 2017-07-07: qty 1

## 2017-07-07 MED ORDER — PALONOSETRON HCL INJECTION 0.25 MG/5ML
INTRAVENOUS | Status: AC
  Filled 2017-07-07: qty 5

## 2017-07-07 MED ORDER — DEXAMETHASONE SODIUM PHOSPHATE 10 MG/ML IJ SOLN
10.0000 mg | Freq: Once | INTRAMUSCULAR | Status: AC
  Administered 2017-07-07: 10 mg via INTRAVENOUS

## 2017-07-07 NOTE — Patient Instructions (Signed)
[facility] Discharge Instructions for Patients Receiving Chemotherapy  Today you received the following chemotherapy agents: Leucovorin and Fluorouracil (Adrucil, 5-FU)  To help prevent nausea and vomiting after your treatment, we encourage you to take your nausea medication as prescribed.    If you develop nausea and vomiting that is not controlled by your nausea medication, call the clinic.   BELOW ARE SYMPTOMS THAT SHOULD BE REPORTED IMMEDIATELY:  *FEVER GREATER THAN 100.5 F  *CHILLS WITH OR WITHOUT FEVER  NAUSEA AND VOMITING THAT IS NOT CONTROLLED WITH YOUR NAUSEA MEDICATION  *UNUSUAL SHORTNESS OF BREATH  *UNUSUAL BRUISING OR BLEEDING  TENDERNESS IN MOUTH AND THROAT WITH OR WITHOUT PRESENCE OF ULCERS  *URINARY PROBLEMS  *BOWEL PROBLEMS  UNUSUAL RASH Items with * indicate a potential emergency and should be followed up as soon as possible.  Feel free to call the clinic should you have any questions or concerns. The clinic phone number is [phone].  Please show the CHEMO ALERT CARD at check-in to the Emergency Department and triage nurse.   

## 2017-07-07 NOTE — Progress Notes (Signed)
Pt's platelets 90,000. Received confirmation from Dr. Irene Limbo for pt to still receive treatment today. Will continue to monitor

## 2017-07-07 NOTE — Progress Notes (Signed)
Dr. Irene Limbo okay to tx with plt 90.

## 2017-07-09 ENCOUNTER — Ambulatory Visit (HOSPITAL_BASED_OUTPATIENT_CLINIC_OR_DEPARTMENT_OTHER): Payer: 59

## 2017-07-09 DIAGNOSIS — Z7189 Other specified counseling: Secondary | ICD-10-CM

## 2017-07-09 DIAGNOSIS — D500 Iron deficiency anemia secondary to blood loss (chronic): Secondary | ICD-10-CM

## 2017-07-09 DIAGNOSIS — Z452 Encounter for adjustment and management of vascular access device: Secondary | ICD-10-CM

## 2017-07-09 DIAGNOSIS — C189 Malignant neoplasm of colon, unspecified: Secondary | ICD-10-CM | POA: Diagnosis not present

## 2017-07-09 DIAGNOSIS — C787 Secondary malignant neoplasm of liver and intrahepatic bile duct: Secondary | ICD-10-CM

## 2017-07-09 DIAGNOSIS — Z95828 Presence of other vascular implants and grafts: Secondary | ICD-10-CM

## 2017-07-09 MED ORDER — HEPARIN SOD (PORK) LOCK FLUSH 100 UNIT/ML IV SOLN
500.0000 [IU] | Freq: Once | INTRAVENOUS | Status: AC | PRN
  Administered 2017-07-09: 500 [IU] via INTRAVENOUS
  Filled 2017-07-09: qty 5

## 2017-07-09 MED ORDER — SODIUM CHLORIDE 0.9% FLUSH
10.0000 mL | INTRAVENOUS | Status: DC | PRN
  Administered 2017-07-09: 10 mL via INTRAVENOUS
  Filled 2017-07-09: qty 10

## 2017-07-09 NOTE — Patient Instructions (Signed)

## 2017-07-13 DIAGNOSIS — M50322 Other cervical disc degeneration at C5-C6 level: Secondary | ICD-10-CM | POA: Diagnosis not present

## 2017-07-13 DIAGNOSIS — M50323 Other cervical disc degeneration at C6-C7 level: Secondary | ICD-10-CM | POA: Diagnosis not present

## 2017-07-15 ENCOUNTER — Ambulatory Visit (HOSPITAL_COMMUNITY)
Admission: RE | Admit: 2017-07-15 | Discharge: 2017-07-15 | Disposition: A | Discharge status: Home / Self Care | Payer: 59 | Source: Ambulatory Visit | Attending: Hematology | Admitting: Hematology

## 2017-07-15 DIAGNOSIS — I700 Atherosclerosis of aorta: Secondary | ICD-10-CM | POA: Diagnosis not present

## 2017-07-15 DIAGNOSIS — J439 Emphysema, unspecified: Secondary | ICD-10-CM | POA: Diagnosis not present

## 2017-07-15 DIAGNOSIS — C787 Secondary malignant neoplasm of liver and intrahepatic bile duct: Secondary | ICD-10-CM | POA: Insufficient documentation

## 2017-07-15 DIAGNOSIS — C189 Malignant neoplasm of colon, unspecified: Secondary | ICD-10-CM | POA: Insufficient documentation

## 2017-07-15 DIAGNOSIS — K760 Fatty (change of) liver, not elsewhere classified: Secondary | ICD-10-CM | POA: Insufficient documentation

## 2017-07-15 DIAGNOSIS — I2510 Atherosclerotic heart disease of native coronary artery without angina pectoris: Secondary | ICD-10-CM | POA: Diagnosis not present

## 2017-07-15 MED ORDER — IOPAMIDOL (ISOVUE-300) INJECTION 61%
100.0000 mL | Freq: Once | INTRAVENOUS | Status: AC | PRN
  Administered 2017-07-15: 100 mL via INTRAVENOUS

## 2017-07-15 MED ORDER — IOPAMIDOL (ISOVUE-300) INJECTION 61%
INTRAVENOUS | Status: AC
  Filled 2017-07-15: qty 100

## 2017-07-19 DIAGNOSIS — E119 Type 2 diabetes mellitus without complications: Secondary | ICD-10-CM | POA: Diagnosis not present

## 2017-07-22 ENCOUNTER — Encounter: Payer: Self-pay | Admitting: Hematology

## 2017-07-22 ENCOUNTER — Ambulatory Visit (HOSPITAL_BASED_OUTPATIENT_CLINIC_OR_DEPARTMENT_OTHER): Payer: 59

## 2017-07-22 ENCOUNTER — Ambulatory Visit: Payer: 59

## 2017-07-22 ENCOUNTER — Other Ambulatory Visit (HOSPITAL_BASED_OUTPATIENT_CLINIC_OR_DEPARTMENT_OTHER): Payer: 59

## 2017-07-22 ENCOUNTER — Ambulatory Visit (HOSPITAL_BASED_OUTPATIENT_CLINIC_OR_DEPARTMENT_OTHER): Payer: 59 | Admitting: Hematology

## 2017-07-22 VITALS — BP 137/82 | HR 80 | Temp 98.5°F | Resp 18 | Ht 74.0 in | Wt 252.1 lb

## 2017-07-22 DIAGNOSIS — C189 Malignant neoplasm of colon, unspecified: Secondary | ICD-10-CM

## 2017-07-22 DIAGNOSIS — D509 Iron deficiency anemia, unspecified: Secondary | ICD-10-CM

## 2017-07-22 DIAGNOSIS — C787 Secondary malignant neoplasm of liver and intrahepatic bile duct: Secondary | ICD-10-CM

## 2017-07-22 DIAGNOSIS — Z7189 Other specified counseling: Secondary | ICD-10-CM

## 2017-07-22 DIAGNOSIS — Z5111 Encounter for antineoplastic chemotherapy: Secondary | ICD-10-CM | POA: Diagnosis not present

## 2017-07-22 LAB — COMPREHENSIVE METABOLIC PANEL
ALT: 59 U/L — AB (ref 0–55)
AST: 31 U/L (ref 5–34)
Albumin: 4.1 g/dL (ref 3.5–5.0)
Alkaline Phosphatase: 128 U/L (ref 40–150)
Anion Gap: 11 mEq/L (ref 3–11)
BILIRUBIN TOTAL: 1.05 mg/dL (ref 0.20–1.20)
BUN: 9.5 mg/dL (ref 7.0–26.0)
CHLORIDE: 105 meq/L (ref 98–109)
CO2: 24 meq/L (ref 22–29)
Calcium: 9.3 mg/dL (ref 8.4–10.4)
Creatinine: 0.9 mg/dL (ref 0.7–1.3)
GLUCOSE: 179 mg/dL — AB (ref 70–140)
Potassium: 4 mEq/L (ref 3.5–5.1)
SODIUM: 140 meq/L (ref 136–145)
TOTAL PROTEIN: 6.9 g/dL (ref 6.4–8.3)

## 2017-07-22 LAB — CBC & DIFF AND RETIC
BASO%: 0.6 % (ref 0.0–2.0)
Basophils Absolute: 0 10*3/uL (ref 0.0–0.1)
EOS%: 3.8 % (ref 0.0–7.0)
Eosinophils Absolute: 0.2 10*3/uL (ref 0.0–0.5)
HCT: 43.4 % (ref 38.4–49.9)
HGB: 14.7 g/dL (ref 13.0–17.1)
IMMATURE RETIC FRACT: 1.9 % — AB (ref 3.00–10.60)
LYMPH#: 0.8 10*3/uL — AB (ref 0.9–3.3)
LYMPH%: 17.3 % (ref 14.0–49.0)
MCH: 31.9 pg (ref 27.2–33.4)
MCHC: 33.9 g/dL (ref 32.0–36.0)
MCV: 94.1 fL (ref 79.3–98.0)
MONO#: 0.6 10*3/uL (ref 0.1–0.9)
MONO%: 13.3 % (ref 0.0–14.0)
NEUT%: 65 % (ref 39.0–75.0)
NEUTROS ABS: 3.1 10*3/uL (ref 1.5–6.5)
Platelets: 108 10*3/uL — ABNORMAL LOW (ref 140–400)
RBC: 4.61 10*6/uL (ref 4.20–5.82)
RDW: 14.8 % — AB (ref 11.0–14.6)
RETIC CT ABS: 71.46 10*3/uL (ref 34.80–93.90)
Retic %: 1.55 % (ref 0.80–1.80)
WBC: 4.8 10*3/uL (ref 4.0–10.3)

## 2017-07-22 LAB — CEA (IN HOUSE-CHCC): CEA (CHCC-In House): 6.39 ng/mL — ABNORMAL HIGH (ref 0.00–5.00)

## 2017-07-22 MED ORDER — PALONOSETRON HCL INJECTION 0.25 MG/5ML
0.2500 mg | Freq: Once | INTRAVENOUS | Status: AC
  Administered 2017-07-22: 0.25 mg via INTRAVENOUS

## 2017-07-22 MED ORDER — PALONOSETRON HCL INJECTION 0.25 MG/5ML
INTRAVENOUS | Status: AC
  Filled 2017-07-22: qty 5

## 2017-07-22 MED ORDER — DEXTROSE 5 % IV SOLN
Freq: Once | INTRAVENOUS | Status: AC
  Administered 2017-07-22: 12:00:00 via INTRAVENOUS

## 2017-07-22 MED ORDER — DEXAMETHASONE SODIUM PHOSPHATE 10 MG/ML IJ SOLN
INTRAMUSCULAR | Status: AC
  Filled 2017-07-22: qty 1

## 2017-07-22 MED ORDER — DEXAMETHASONE SODIUM PHOSPHATE 10 MG/ML IJ SOLN
10.0000 mg | Freq: Once | INTRAMUSCULAR | Status: AC
  Administered 2017-07-22: 10 mg via INTRAVENOUS

## 2017-07-22 MED ORDER — SODIUM CHLORIDE 0.9 % IV SOLN
400.0000 mg/m2 | Freq: Once | INTRAVENOUS | Status: AC
  Administered 2017-07-22: 964 mg via INTRAVENOUS
  Filled 2017-07-22: qty 48.2

## 2017-07-22 MED ORDER — SODIUM CHLORIDE 0.9 % IV SOLN
2400.0000 mg/m2 | INTRAVENOUS | Status: DC
  Administered 2017-07-22: 5800 mg via INTRAVENOUS
  Filled 2017-07-22: qty 116

## 2017-07-22 NOTE — Patient Instructions (Signed)
Thank you for choosing [facility] to provide your oncology and hematology care.  To afford each patient quality time with our providers, please arrive 30 minutes before your scheduled appointment time.  If you arrive late for your appointment, you may be asked to reschedule.  We strive to give you quality time with our providers, and arriving late affects you and other patients whose appointments are after yours.  If you are a no show for multiple scheduled visits, you may be dismissed from the clinic at the providers discretion.   Again, thank you for choosing [facility], our hope is that these requests will decrease the amount of time that you wait before being seen by our physicians.  ______________________________________________________________________ Should you have questions after your visit to the [facility], please contact our office at [phone] between the hours of 8:30 and 4:30 p.m.    Voicemails left after 4:30p.m will not be returned until the following business day.   For prescription refill requests, please have your pharmacy contact us directly.  Please also try to allow 48 hours for prescription requests.   Please contact the scheduling department for questions regarding scheduling.  For scheduling of procedures such as PET scans, CT scans, MRI, Ultrasound, etc please contact central scheduling at [phone].   Resources For Cancer Patients and Caregivers:  American Cancer Society:  800-227-2345  Can help patients locate various types of support and financial assistance Cancer Care: 1-800-813-HOPE (4673) Provides financial assistance, online support groups, medication/co-pay assistance.   [place] DSS:  336-641-3447 Where to apply for food stamps, Medicaid, and utility assistance Medicare Rights Center: 800-333-4114 Helps people with Medicare understand their rights and benefits, navigate the Medicare system, and secure the  quality healthcare they deserve SCAT: 336-333-6589 [employer] [employer]'s shared-ride transportation service for eligible riders who have a disability that prevents them from riding the fixed route bus.   For additional information on assistance programs please contact our social worker:   Grier Hock/Abigail Elmore:  [phone] 

## 2017-07-22 NOTE — Patient Instructions (Signed)
[facility] Discharge Instructions for Patients Receiving Chemotherapy  Today you received the following chemotherapy agents: Leucovorin and Fluorouracil (Adrucil, 5-FU)  To help prevent nausea and vomiting after your treatment, we encourage you to take your nausea medication as prescribed.    If you develop nausea and vomiting that is not controlled by your nausea medication, call the clinic.   BELOW ARE SYMPTOMS THAT SHOULD BE REPORTED IMMEDIATELY:  *FEVER GREATER THAN 100.5 F  *CHILLS WITH OR WITHOUT FEVER  NAUSEA AND VOMITING THAT IS NOT CONTROLLED WITH YOUR NAUSEA MEDICATION  *UNUSUAL SHORTNESS OF BREATH  *UNUSUAL BRUISING OR BLEEDING  TENDERNESS IN MOUTH AND THROAT WITH OR WITHOUT PRESENCE OF ULCERS  *URINARY PROBLEMS  *BOWEL PROBLEMS  UNUSUAL RASH Items with * indicate a potential emergency and should be followed up as soon as possible.  Feel free to call the clinic should you have any questions or concerns. The clinic phone number is [phone].  Please show the CHEMO ALERT CARD at check-in to the Emergency Department and triage nurse.   

## 2017-07-22 NOTE — Progress Notes (Signed)
[name]    HEMATOLOGY/ONCOLOGY CLINIC NOTE  Date of Service: 07/22/17  Patient Care Team: [name], MD as PCP - General (Family Medicine) [name] M.D. 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 KRAS mutated BRAF, NRAS mutation neg MSI Stable.  PET/CT scan done on 06/25/2016 Show multiple foci of hypermetabolic hepatic metastases (atleast 4-5) and 2 hypermetabolic peritoneal nodules along the dorsal peritoneal surface concerning for peritoneal metastases.  MRI Liver 07/27/2016 - Multiple small liver metastases throughout the right and left hepatic lobes, largest measuring 2.1 cm. 1.2 cm enhancing peritoneal nodule in left paracolic gutter, suspicious for peritoneal metastasis. Other small peritoneal nodules better visualized on recent PET-CT which showed hypermetabolic activity, also suspicious for peritoneal metastases.   CT CHEST WO CONTRAST 10/12/2016  [facility] Result Impression   1. Congenital variant of bilateral middle lobes. 2. Groundglass opacification with regions of bronchiectasis in the right lower lobe adjacent to the fissure, consistent with inflammatory/infectious changes. 3. No definitive evidence of metastatic disease to the chest. 4. Probable sebaceous cyst in the subcutaneous tissues beneath the left anterior chest.   MR ABDOMEN AND PELVIS NATFTDDU FOLLOW UP2/26/2018 [facility] Result Impression    Decreased size of multiple hepatic lesions and two peritoneal nodules compared to outside MRI 07/27/2016. No new lesions identified in  the abdomen or pelvis.     PREVIOUS TREATMENT  FOLFOX x 10 cycles. 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 (without 5FU bolus - due to thrombocytopenia). S/p Y-90 treatment to 1 hepatic lobe.  Plan for bilobar hepatic Y90 radio-embolization.  INTERVAL HISTORY  Patient is here for follow-up for his metastatic colon cancer and for cycle 25 of 5FU/leucovorin for maintenance therapy. Of note since his last visit, he had a  CT abd done on 07/15/2017 with results showing: IMPRESSION: 1. Among the various hypermetabolic foci shown on the prior PET-CT of 07/15/2016 in the liver, the only appreciable remaining intraparenchymal lesion is a 1.1 cm hypodense lesion in segment 8 which corresponds to the regional largest lesion. There is a small focal convexity along the anterior margin of the right hepatic lobe which could represent a capsular tumor deposit on image 125/5. 2. Sensitivity in assessing the liver is somewhat reduced due to the diffuse geographic hepatic steatosis. There are some clips or coils along the porta hepatis, and some of the hypodensity and heterogeneity in hepatic enhancement could be attributable to vascular embolization in the past, correlate with prior procedural history. 3. Bandlike thickening along the left paracolic gutter extends up slightly more cephalad than it did in 2017, now reaching the inferior hepatic margin. This bandlike thickening was previously hypermetabolic and considered suspicious for peritoneal spread of tumor. However, the increase is only minimal compared to previous. 4. Other imaging findings of potential clinical significance: Aortic Atherosclerosis (ICD10-I70.0) and Emphysema (ICD10-J43.9). Left anterior descending minimal coronary artery atherosclerosis. Partial right hemicolectomy. Uphill varices adjacent to the  distal esophagus suggests some degree of portal venous hypertension.    He has been  doing well overall. He is accompanied by his wife today. CEA today, 07/22/2017 is 6.39. Labs are stable. He states he is going to visit his family in [place] for the holidays. He expresses fatigue after chemotherapy and states he usually sleeps an entire day after treatment. He has not needed any walking aid.   On review of systems, pt reports fatigue, mild folliculitis and denies mouth sores, fever, chills, night sweats and any other accompanying symptoms.  MEDICAL HISTORY:  Past Medical History:  Diagnosis Date  . Anemia   . Arthritis   . Cancer ([place])   . Diabetes mellitus without complication (HCC)    diet controlled  . History of blood transfusion   . Hyperlipidemia   . Sleep apnea    cpap  . Wears glasses     SURGICAL HISTORY: Past Surgical History:  Procedure Laterality Date  . COLON SURGERY    . IR GENERIC HISTORICAL  09/24/2016   IR CV LINE INJECTION 09/24/2016 [facility]  . IR GENERIC HISTORICAL  09/24/2016   IR US GUIDE VASC ACCESS RIGHT 09/24/2016 [facility]  . IR GENERIC HISTORICAL  09/24/2016   IR FLUORO GUIDE CV LINE RIGHT 09/24/2016 [facility]  . IR GENERIC HISTORICAL  09/30/2016   IR FLUORO GUIDE PORT INSERTION RIGHT 09/30/2016 Markus Daft, MD [facility]  . IR GENERIC HISTORICAL  09/30/2016   IR US GUIDE VASC ACCESS RIGHT 09/30/2016 Markus Daft, MD [facility]  . IR GENERIC HISTORICAL  09/30/2016   IR REMOVAL TUN ACCESS W/ PORT W/O FL MOD SED 09/30/2016 Markus Daft, MD [facility]  . LAPAROSCOPIC RIGHT HEMI COLECTOMY Right 06/17/2016   Procedure: LAPAROSCOPIC ASSISTED  RIGHT HEMI COLECTOMY;  Surgeon: [name], MD;  Location: [facility];  Service: General;  Laterality: Right;  . PORTACATH PLACEMENT Left 07/13/2016   Procedure: INSERTION PORT-A-CATH left subclavian;  Surgeon: [name], MD;  Location: [facility];  Service: General;  Laterality: Left;  . SHOULDER ACROMIOPLASTY Right  12/20/2014   Procedure: SHOULDER ACROMIOPLASTY;  Surgeon: Melrose Nakayama, MD;  Location: [place];  Service: Orthopedics;  Laterality: Right;  . SHOULDER ARTHROSCOPY Right 12/20/2014   Procedure: RIGHT ARTHROSCOPY SHOULDER WITH DEBRIDEMENT;  Surgeon: Melrose Nakayama, MD;  Location: [place];  Service: Orthopedics;  Laterality: Right;  . TESTICLE SURGERY     as teen  . TONSILLECTOMY    . WISDOM TOOTH EXTRACTION      SOCIAL HISTORY: Social History   Socioeconomic History  . Marital status: Married    Spouse name: Not on file  . Number of children: Not on file  . Years of education: Not on file  . Highest education level: Not on file  Social Needs  . Financial resource strain: Not on file  . Food insecurity - worry: Not on file  . Food insecurity - inability: Not on file  . Transportation needs - medical: Not on file  . Transportation needs - non-medical: Not on file  Occupational History  . Occupation: cemetery maintenance  Tobacco Use  . Smoking status: Former Smoker    Packs/day: 1.50    Years: 29.00    Pack years: 43.50    Types: Cigarettes    Last attempt to quit: 12/16/2012    Years since quitting: 4.6  . Smokeless tobacco: Never Used  . Tobacco comment: 1-2 ppd from age [age]y to 2014  Substance and Sexual Activity  . Alcohol use: Yes    Alcohol/week: 1.8 oz  Types: 3 Shots of liquor per week    Comment:  some days  . Drug use: No  . Sexual activity: Not on file  Other Topics Concern  . Not on file  Social History Narrative  . Not on file    FAMILY HISTORY: Family History  Problem Relation Age of Onset  . Diabetes Other   . Hyperlipidemia Other   . Hypertension Other   . Breast cancer Maternal Aunt 70  . Prostate cancer Maternal Uncle 75  . Lung cancer Paternal Uncle 33  . Stroke Maternal Grandfather 72  . Cancer Paternal Grandmother 40       dx cancer of pancreas and colon, unknown if separate primaries  . Heart Problems  Paternal Grandfather        d. 52  . Prostate cancer Maternal Uncle 36  . Breast cancer Maternal Aunt 75  . Breast cancer Maternal Aunt 68  . Cervical cancer Maternal Aunt 68  . Pancreatic cancer Maternal Aunt 54       d. 58y; heavy smoker  . Colon cancer Paternal Uncle 64       s/p partial colectomy; dx. second colon cancer at age 2-64    ALLERGIES:  is allergic to penicillins.  MEDICATIONS:  Current Outpatient Medications  Medication Sig Dispense Refill  . b complex vitamins tablet Take 1 tablet by mouth daily.    [name] dexamethasone (DECADRON) 4 MG tablet Take 2 tablets (8 mg total) by mouth daily. Start the day after chemotherapy for 2 days. Take with food. 30 tablet 0  . DULoxetine (CYMBALTA) 60 MG capsule TAKE 1 CAPSULE BY MOUTH EVERY DAY 30 capsule 1  . lidocaine-prilocaine (EMLA) cream Apply to affected area once 30 g 3  . LORazepam (ATIVAN) 0.5 MG tablet Take 1 tablet (0.5 mg total) by mouth every 8 (eight) hours. For anxiety or sleep 30 tablet 0  . Multiple Vitamin (MULTIVITAMIN) tablet Take 1 tablet by mouth daily.    . ondansetron (ZOFRAN) 8 MG tablet Take 1 tablet (8 mg total) by mouth 2 (two) times daily as needed for refractory nausea / vomiting. Start on day 3 after chemotherapy. 30 tablet 1  . Probiotic Product (PROBIOTIC PO) Take 1 tablet by mouth daily.    . prochlorperazine (COMPAZINE) 10 MG tablet Take 1 tablet (10 mg total) by mouth every 6 (six) hours as needed (Nausea or vomiting). 30 tablet 1  . pyridOXINE (VITAMIN B-6) 100 MG tablet Take 200 mg by mouth daily.    . traMADol (ULTRAM) 50 MG tablet Take 1 tablet (50 mg total) by mouth every 6 (six) hours as needed for moderate pain or severe pain. 60 tablet 0   No current facility-administered medications for this visit.     REVIEW OF SYSTEMS:    10 Point review of Systems was done is negative except as noted above.  PHYSICAL EXAMINATION:  ECOG PERFORMANCE STATUS: 1 - Symptomatic but completely  ambulatory VS GENERAL:alert, in no acute distress and comfortable SKIN: skin color, texture, turgor are normal, no rashes or significant lesions EYES: normal, conjunctiva are pink and non-injected, sclera clear OROPHARYNX:no exudate, no erythema and lips, buccal mucosa, and tongue normal  NECK: supple, no JVD, thyroid normal size, non-tender, without nodularity LYMPH:  no palpable lymphadenopathy in the cervical, axillary or inguinal LUNGS: clear to auscultation with normal respiratory effort HEART: regular rate & rhythm,  no murmurs and no lower extremity edema ABDOMEN: abdomen soft, bowel sounds normoactive Musculoskeletal: no cyanosis of  digits and no clubbing  PSYCH: alert & oriented x 3 with fluent speech NEURO: no focal motor/sensory deficits   LABORATORY DATA:  I have reviewed the data as listed  .[name] CBC Latest Ref Rng & Units 07/22/2017 07/07/2017 06/24/2017  WBC 4.0 - 10.3 10e3/uL 4.8 4.7 5.0  Hemoglobin 13.0 - 17.1 g/dL 14.7 14.4 14.8  Hematocrit 38.4 - 49.9 % 43.4 42.6 44.4  Platelets 140 - 400 10e3/uL 108(L) 90(L) 101(L)   . CMP Latest Ref Rng & Units 07/22/2017 07/07/2017 06/24/2017  Glucose 70 - 140 mg/dl 179(H) 239(H) 234(H)  BUN 7.0 - 26.0 mg/dL 9.5 6.8(L) 7.1  Creatinine 0.7 - 1.3 mg/dL 0.9 0.9 0.9  Sodium 136 - 145 mEq/L 140 138 139  Potassium 3.5 - 5.1 mEq/L 4.0 4.0 4.2  Chloride 101 - 111 mmol/L - - -  CO2 22 - 29 mEq/L _0 Calcium 8.4 - 10.4 mg/dL 9.3 9.2 9.2  Total Protein 6.4 - 8.3 g/dL 6.9 6.7 7.2  Total Bilirubin 0.20 - 1.20 mg/dL 1.05 1.01 0.97  Alkaline Phos 40 - 150 U/L 128 130 147  AST 5 - 34 U/L 31 37(H) 37(H)  ALT 0 - 55 U/L 59(H) 51 45       Microscopic Comment 2. COLON AND RECTUM (INCLUDING TRANS-ANAL RESECTION): Specimen: Terminal ileum, right colon and appendix Procedure: Segmental resection Tumor site: Proximal ascending colon Specimen integrity: Intact Macroscopic intactness of mesorectum: Not applicable: x Complete:  NA Near complete: NA Incomplete: NA Cannot be determined (specify): NA Macroscopic tumor perforation: The mass invades through muscularis propria into pericolonic soft tissue Invasive tumor: Maximum size: 5.5 cm Histologic type(s): Adenocarcinoma Histologic grade and differentiation: G3 G1: well differentiated/low grade 1 of 4 Supplemental copy SUPPLEMENTAL for Gitto, Nycere ([id number]) Microscopic Comment(continued) G2: moderately differentiated/low grade G3: poorly differentiated/high grade G4: undifferentiated/high grade Type of polyp in which invasive carcinoma arose: Tubular adenoma Microscopic extension of invasive tumor: The mass invade through the muscularis propria into pericolonic soft tissue Lymph-Vascular invasion: Identified Peri-neural invasion: Identified Tumor deposit(s) (discontinuous extramural extension): Present Resection margins: Proximal margin: Negative Distal margin: Negative Circumferential (radial) (posterior ascending, posterior descending; lateral and posterior mid-rectum; and entire lower 1/3 rectum):Negative Mesenteric margin (sigmoid and transverse): NA Distance closest margin (if all above margins negative): 3.7 cm from the non peritonealized pericolic soft tissue margin Trans-anal resection margins only: Deep margin: NA Mucosal Margin: NA Distance closest mucosal margin (if negative): NA Treatment effect (neo-adjuvant therapy): NA Additional polyp(s): Negative Non-neoplastic findings: Unremarkable Lymph nodes: number examined 18; number positive: 5 Pathologic Staging: pT3, N2a, M1a Ancillary studies: MSI ordered      RADIOGRAPHIC STUDIES: I have personally reviewed the radiological images as listed and agreed with the findings in the report. Ct Chest W Contrast  Result Date: 07/16/2017 CLINICAL DATA:  Restaging metastatic colon cancer EXAM: CT CHEST, ABDOMEN, AND PELVIS WITH CONTRAST TECHNIQUE: Multidetector CT imaging of the chest,  abdomen and pelvis was performed following the standard protocol during bolus administration of intravenous contrast. CONTRAST:  170m ISOVUE-300 IOPAMIDOL (ISOVUE-300) INJECTION 61% COMPARISON:  Multiple exams, including MRI 07/27/2016 and PET-CT from 07/15/2016 FINDINGS: CT CHEST FINDINGS Cardiovascular: Right Port-A-Cath tip: SVC. Minimal atherosclerotic calcification in the left anterior descending coronary artery. Mediastinum/Nodes: A right hilar lymph node measures 0.6 cm in vertical short axis, image 82/5. Lungs/Pleura: Centrilobular emphysema. Musculoskeletal: Incidental accessory segmentation of the sternal body. CT ABDOMEN PELVIS FINDINGS Hepatobiliary: Geographic hepatic steatosis with relative sparing of segments 6 and 7. In the vicinity  of the prior dominant lesion in segment 8 of the liver there is only a faint 1.1 cm in diameter hypodensity on image 56 series 2 of today's exam. There is some mild focal fatty sparing along the gallbladder fossa. On the prior PET-CT there were multiple additional foci of hypermetabolic activity on 39/76/7341, these lesions are not well appreciated on today's PET-CT. Questionable small capsular to positive tumor along the right hepatic lobe on image 125/5. Pancreas: Unremarkable Spleen: Unremarkable Adrenals/Urinary Tract: Unremarkable Stomach/Bowel: Partial right hemicolectomy. Formed stool in the distal colon. Vascular/Lymphatic: There small uphill varices adjacent to the distal esophagus. Vascular coils are clips along the porta hepatis. Aortoiliac atherosclerotic vascular disease. Reproductive: Unremarkable Other: There continues to be some bandlike nodularity along the left paracolic gutter, which now extends up to the inferior margin of the spleen as shown on image 102/5. This was previously hypermetabolic. Musculoskeletal: No compelling findings of bony metastatic disease. IMPRESSION: 1. Among the various hypermetabolic foci shown on the prior PET-CT of 07/15/2016  in the liver, the only appreciable remaining intraparenchymal lesion is a 1.1 cm hypodense lesion in segment 8 which corresponds to the regional largest lesion. There is a small focal convexity along the anterior margin of the right hepatic lobe which could represent a capsular tumor deposit on image 125/5. 2. Sensitivity in assessing the liver is somewhat reduced due to the diffuse geographic hepatic steatosis. There are some clips or coils along the porta hepatis, and some of the hypodensity and heterogeneity in hepatic enhancement could be attributable to vascular embolization in the past, correlate with prior procedural history. 3. Bandlike thickening along the left paracolic gutter extends up slightly more cephalad than it did in 2017, now reaching the inferior hepatic margin. This bandlike thickening was previously hypermetabolic and considered suspicious for peritoneal spread of tumor. However, the increase is only minimal compared to previous. 4. Other imaging findings of potential clinical significance: Aortic Atherosclerosis (ICD10-I70.0) and Emphysema (ICD10-J43.9). Left anterior descending minimal coronary artery atherosclerosis. Partial right hemicolectomy. Uphill varices adjacent to the distal esophagus suggests some degree of portal venous hypertension. Electronically Signed   By: Van Clines M.D.   On: 07/16/2017 09:31   Ct Abdomen Pelvis W Contrast  Result Date: 07/16/2017 CLINICAL DATA:  Restaging metastatic colon cancer EXAM: CT CHEST, ABDOMEN, AND PELVIS WITH CONTRAST TECHNIQUE: Multidetector CT imaging of the chest, abdomen and pelvis was performed following the standard protocol during bolus administration of intravenous contrast. CONTRAST:  142m ISOVUE-300 IOPAMIDOL (ISOVUE-300) INJECTION 61% COMPARISON:  Multiple exams, including MRI 07/27/2016 and PET-CT from 07/15/2016 FINDINGS: CT CHEST FINDINGS Cardiovascular: Right Port-A-Cath tip: SVC. Minimal atherosclerotic calcification  in the left anterior descending coronary artery. Mediastinum/Nodes: A right hilar lymph node measures 0.6 cm in vertical short axis, image 82/5. Lungs/Pleura: Centrilobular emphysema. Musculoskeletal: Incidental accessory segmentation of the sternal body. CT ABDOMEN PELVIS FINDINGS Hepatobiliary: Geographic hepatic steatosis with relative sparing of segments 6 and 7. In the vicinity of the prior dominant lesion in segment 8 of the liver there is only a faint 1.1 cm in diameter hypodensity on image 56 series 2 of today's exam. There is some mild focal fatty sparing along the gallbladder fossa. On the prior PET-CT there were multiple additional foci of hypermetabolic activity on 193/79/0240 these lesions are not well appreciated on today's PET-CT. Questionable small capsular to positive tumor along the right hepatic lobe on image 125/5. Pancreas: Unremarkable Spleen: Unremarkable Adrenals/Urinary Tract: Unremarkable Stomach/Bowel: Partial right hemicolectomy. Formed stool in the distal colon. Vascular/Lymphatic:  There small uphill varices adjacent to the distal esophagus. Vascular coils are clips along the porta hepatis. Aortoiliac atherosclerotic vascular disease. Reproductive: Unremarkable Other: There continues to be some bandlike nodularity along the left paracolic gutter, which now extends up to the inferior margin of the spleen as shown on image 102/5. This was previously hypermetabolic. Musculoskeletal: No compelling findings of bony metastatic disease. IMPRESSION: 1. Among the various hypermetabolic foci shown on the prior PET-CT of 07/15/2016 in the liver, the only appreciable remaining intraparenchymal lesion is a 1.1 cm hypodense lesion in segment 8 which corresponds to the regional largest lesion. There is a small focal convexity along the anterior margin of the right hepatic lobe which could represent a capsular tumor deposit on image 125/5. 2. Sensitivity in assessing the liver is somewhat reduced due to  the diffuse geographic hepatic steatosis. There are some clips or coils along the porta hepatis, and some of the hypodensity and heterogeneity in hepatic enhancement could be attributable to vascular embolization in the past, correlate with prior procedural history. 3. Bandlike thickening along the left paracolic gutter extends up slightly more cephalad than it did in 2017, now reaching the inferior hepatic margin. This bandlike thickening was previously hypermetabolic and considered suspicious for peritoneal spread of tumor. However, the increase is only minimal compared to previous. 4. Other imaging findings of potential clinical significance: Aortic Atherosclerosis (ICD10-I70.0) and Emphysema (ICD10-J43.9). Left anterior descending minimal coronary artery atherosclerosis. Partial right hemicolectomy. Uphill varices adjacent to the distal esophagus suggests some degree of portal venous hypertension. Electronically Signed   By: Van Clines M.D.   On: 07/16/2017 09:31   2018 February MR ABDOMEN AND PELVIS WWOTUMOR FOLLOW UP2/26/2018 [facility] Result Impression    Decreased size of multiple hepatic lesions and two peritoneal nodules compared to outside MRI 07/27/2016. No new lesions identified in the abdomen or pelvis.  Result Narrative  MR ABDOMEN AND PELVIS WITH AND WITHOUT CONTRAST (TUMOR FOLLOW-UP PROTOCOL), 10/12/2016 5:59 PM   INDICATION: liver and peritoneal colon cancer metastases \ C18.9 Metastatic colon cancer to liver (HCC) \ C78.7 Metastatic colon cancer to liver ([place]) \ C78.6 Peritoneal carcinomatosis ([place]) \ C80.1 Peritoneal carcinomatosis ([place])   ADDITIONAL HISTORY: History of stage IV colorectal cancer status post laparoscopic right hemicolectomy on 06/17/2016. At time of surgery, tumor nodule in the right lobe of the liver was positive on biopsy for adenocarcinoma. Patient currently undergoing chemotherapy.  COMPARISON: Outside studies including CT  chest abdomen pelvis 05/23/2016, PET CT 07/15/2016, and MR abdomen 07/27/2016   TECHNIQUE: Multiplanar, multisequence MR images of the abdomen and pelvis were obtained before and after intravenous administration of gadolinium-based contrast.   FINDINGS:  LOWER CHEST Heart: Normal size. No pericardial effusion. Lungs/pleura: No masses, consolidations, or effusions.  ABDOMEN Liver: Liver is enlarged, measuring 20 cm in greatest craniocaudal dimension. Hepatic steatosis. Multiple T2 hyperintense and T1 iso to hypointense lesions throughout the liver as marked in series 16, decreased in size compared to outside MR 07/27/2016. Largest lesion is in segment 8 and measures 1.4 cm and demonstrates peripheral enhancement. No new lesion. Gallbladder: Normal. No stones or inflammatory changes. Biliary: No obstruction. Spleen: Spleen is mildly enlarged, measuring 13.8 cm in greatest length. Pancreas: Normal. Adrenals: Normal. Kidneys: Multiple T2 hyperintense nonenhancing lesions in the lower pole of the kidneys bilaterally, consistent with cysts. No hydronephrosis. Stomach/bowel: Right hemicolectomy. Peritoneum: Decreased size of peritoneal nodule in the left paracolic gutter measuring 6 mm (series 10, image 45). Decreased size of peritoneal  nodule in the left lower quadrant along the ventral abdominal wall measuring 8 mm (series 23, image 13). No apparent new measurable lesions. Extraperitoneum: Within normal limits. Vascular: Within normal limits.  PELVIS Ureters: Within normal limits. Bladder: Within normal limits. Reproductive system: Within normal limits.  MSK: Lower midline ventral abdominal wall scar.   CT CHEST WO CONTRAST2/26/2018 [facility] Result Impression   1. Congenital variant of bilateral middle lobes. 2. Groundglass opacification with regions of bronchiectasis in the right lower lobe adjacent to the fissure, consistent with inflammatory/infectious  changes. 3. No definitive evidence of metastatic disease to the chest. 4. Probable sebaceous cyst in the subcutaneous tissues beneath the left anterior chest.  Result Narrative  CT CHEST WO CONTRAST, 10/12/2016 3:24 PM  INDICATION:stage IV colon cancer \ C18.9 Metastatic colon cancer to liver ([place]) \ C78.7 Metastatic colon cancer to liver ([place]) \ C78.6 Peritoneal carcinomatosis ([place]) \ C80.1 Peritoneal carcinomatosis ([place])  COMPARISON: None  TECHNIQUE: Multislice axial images were obtained through the chest without administration of iodinated intravenous contrast material. Multi-planar reformatted images were generated for additional analysis. Nongated technique limits cardiac detail.  All CT scans at [facility] and [place] are performed using dose optimization techniques as appropriate to a performed exam, including but not limited to one or more of the following: automated exposure control, adjustment of the mA and/or kV according to patient size, use of iterative reconstruction technique. In addition, [facility] is participating in the [place] program which will further assist [place] in optimizing patient radiation exposure.   FINDINGS:   Thoracic inlet/central airways: Parenchyma is within normal limits for age. The trachea is patent. Mediastinum/hila/axilla: Scattered, nonpathologically enlarged nodes. Heart/vessels: Normal heart size. Trace pericardial effusion. Coronary artery calcifications.. The tip of the central line is in the distal SVC. Lungs/pleura: There are bilateral middle lobes. Subtle groundglass opacification surrounding the posterior aspect of the right major fissure. This is seen in association with some bronchiectasis. Upper abdomen: See concurrent MRI study. Chest wall/MSK: Multi-segmented sternum. 1.5 cm low-density soft tissue nodule in the subcutaneous tissues beneath the left anterior chest.     MR LIVER  WWO CONTRAST5/18/2018 [facility] Result Impression   1.When compared to the February 2018 MRI, there has been no interval change in the multiple T2 hyperintense lesions scattered throughout the liver compatible with metastases. When compared to the initial MRI performed December 2017, lesions have decreased in size. 2.No evidence of new hepatic or peritoneal lesions.    CT CHEST ABDOMEN PELVIS W CONTRAST (ROUTINE)01/01/2017 [facility] Result Impression   1. Most of the small hepatic lesions noted on prior MRI are not seen on today's CT, likely due to technical differences inherent to the modalities. The largest lesion noted in segment 8 on the prior examination is unchanged in size. 2. Tiny implant along the upper left paracolic gutter, quite similar to the February 2018 MRI. No ascites or new peritoneal lesions are otherwise evident. 3. Status post right hemicolectomy. 4. Hepatic steatosis. 5. Additional ancillary findings as above.   MR LIVER WWO CONTRAST 07/01/2017 Result Impression   1.Three T2 hyperintense metastatic lesions are seen in the liver, with the largest again in segment 8. These appear unchanged from prior, again without appreciable enhancement or restricted diffusion. Several of the previously noted tiny lesions are no longer visualized. No new lesions identified.  2.Treatment related changes in the posterior right hepatic lobe. 3.A peritoneal  nodule adjacent to the splenic flexure is now more conspicuous compared to the prior study but appears similar to more remote imaging and again could relate to neoplastic disease, although is indeterminate.     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 KRAS mutated BRAF, NRAS  mutation neg MSI Stable.  PET/CT scan done on 06/25/2016 Show multiple foci of hypermetabolic hepatic metastases (atleast 4-5) and 2 hypermetabolic peritoneal nodules along the dorsal peritoneal surface concerning for peritoneal metastases.  MRI Liver 07/27/2016 - Multiple small liver metastases throughout the right and left hepatic lobes, largest measuring 2.1 cm. 1.2 cm enhancing peritoneal nodule in left paracolic gutter, suspicious for peritoneal metastasis. Other small peritoneal nodules better visualized on recent PET-CT which showed hypermetabolic activity, also suspicious for peritoneal metastases.   MRI Abd 10/12/2016 at [facility] --shows improvement in all the liver and the 2 peritoneal lesions and no new lesions.  MRI liver and CT C/A/P on 01/01/2017 as noted above showed improved/stable disease.   MRI Liver 07/01/2017 at [facility] - 1.Three T2 hyperintense metastatic lesions are seen in the liver, with the largest again in segment 8. These appear unchanged from prior, again without appreciable enhancement or restricted diffusion. Several of the previously noted tiny lesions are no longer visualized. No new lesions identified.  2.Treatment related changes in the posterior right hepatic lobe. 3.A peritoneal nodule adjacent to the splenic flexure is now more conspicuous compared to the prior study but appears similar to more remote imaging and again could relate to neoplastic disease, although is indeterminate.  2) Mild thrombocytopenia PLT improved from 84k to 101k (already have held 5FU bolus dose).  -Platelets at 101k as of today, 06/24/2017. Platelets have continued to remain stable.  PLAN -no overt prohibitive toxicities from 5FU/leucovorin except mild thrombocytopenia which continues to improve. -continue 5FU/LV without bolus -Continue f/u with [facility] for serial hepatic Y90 radio-embolization for better control of hepatic metastatic disease.  (follows with Dr Pecola Lawless IR at [facility] for  Y-90 treatments) -MRI results noted -grade 1 neuropathy stable/improved--continue Cymbalta 41m po daily. --continue maintenance 5FU/leucovorin q2weeks. Might need to switch to q3weeks if thrombocytopenia becomes limiting <75k or will cut down 5FU dose. -will continue maintenance 5FU/leucovorin for controlling peritoneal disease and may consider rad onc consultation for RT to residual peritoneal disease post liver directed therapies after completion of Y-90 hepatic embolization. -[facility] his hepatic embolizations. He has follow up in his clinic following this.  -He has experienced some BPPV recently, we will again refer him to vestibular physical therapy to help aid with this.  -We will also start him on Meclizine to try and help with this as well.  -He did have a question about having his scheduled colonoscopy, I informed him that even though he has metastatic disease, his disease is well controled as of now I advised him to consider colonoscopic re-evaluation. -MRI results were discussed with pt and his wife  -Will continue maintenance treatment at this time    #2 Iron deficiency anemia due to GI bleeding from the tumor an some blood loss with surgery.  Iron def resolved. Lab Results  Component Value Date   IRON 302 (H) 09/23/2016   TIBC 481 (H) 09/23/2016   IRONPCTSAT 63 (  H) 09/23/2016   (Iron and TIBC)  Lab Results  Component Value Date   FERRITIN 108 12/31/2016   Plan -now off PO iron   #5 Cervalgia due to DDD -tramadol prn  #6  Patient Active Problem List   Diagnosis Date Noted  . Counseling regarding advanced care planning and goals of care 05/27/2017  . Chemotherapy-induced neuropathy ([place]) 11/18/2016  . Genetic testing 10/30/2016  . Port catheter in place 09/25/2016  . Family history of colon cancer 07/24/2016  . Family history of prostate cancer 07/24/2016  . Metastatic colon  cancer to liver ([place]) 07/01/2016  . Right Colon cancer metastasized to liver ([place]) 06/17/2016  . Chronic GI bleeding 05/29/2016  . Diabetes mellitus type 2, diet-controlled ([place]) 05/22/2016  . OSA (obstructive sleep apnea) 05/22/2016  . Hyperlipidemia 05/22/2016  . Anemia due to blood loss, chronic   . Antineoplastic chemotherapy induced pancytopenia (CODE) ([place]) 05/21/2016  Plan:  -Continue follow-up with primary care physician [name], MD for optimization of diabetes management. Might need basal insulin if his blood sugars are elevated with chemotherapy/ steroids. -Continue use of CPAP for sleep apnea.   -continue 5FU/Leucovorin q2weeks -schedule next 4 cycles Labs q2weeks RTC with Dr Irene Limbo in 4 weeks with labs and every other cycle of treatment   All of the patients questions were answered with apparent satisfaction. The patient knows to call the clinic with any problems, questions or concerns.  I spent 20 minutes counseling the patient face to face. The total time spent in the appointment was 25 minutes and more than 50% was on counseling and direct patient cares.    Sullivan Lone MD [place] AAHIVMS [facility] Hematology/Oncology Physician [place]  (Office):       [phone] (Work cell):  [phone] (Fax):           [phone]  This document serves as a record of services personally performed by Sullivan Lone, MD. It was created on his behalf by Alean Rinne, a trained medical scribe. The creation of this record is based on the scribe's personal observations and the provider's statements to them. This document has been checked and approved by the attending provider.  .I have reviewed the above documentation for accuracy and completeness, and I agree with the above. Brunetta Genera MD MS

## 2017-07-24 ENCOUNTER — Ambulatory Visit (HOSPITAL_BASED_OUTPATIENT_CLINIC_OR_DEPARTMENT_OTHER): Payer: 59

## 2017-07-24 VITALS — BP 128/82 | HR 72 | Temp 98.4°F | Resp 16

## 2017-07-24 DIAGNOSIS — Z7189 Other specified counseling: Secondary | ICD-10-CM

## 2017-07-24 DIAGNOSIS — C189 Malignant neoplasm of colon, unspecified: Secondary | ICD-10-CM | POA: Diagnosis not present

## 2017-07-24 DIAGNOSIS — C787 Secondary malignant neoplasm of liver and intrahepatic bile duct: Secondary | ICD-10-CM

## 2017-07-24 MED ORDER — HEPARIN SOD (PORK) LOCK FLUSH 100 UNIT/ML IV SOLN
500.0000 [IU] | Freq: Once | INTRAVENOUS | Status: AC | PRN
  Administered 2017-07-24: 500 [IU]
  Filled 2017-07-24: qty 5

## 2017-07-24 MED ORDER — SODIUM CHLORIDE 0.9% FLUSH
10.0000 mL | INTRAVENOUS | Status: DC | PRN
  Administered 2017-07-24: 10 mL
  Filled 2017-07-24: qty 10

## 2017-07-24 NOTE — Patient Instructions (Signed)
[place] [facility] Discharge Instructions for Patients Receiving Chemotherapy  Today you received the following chemotherapy agents 5FU Home Pump.  To help prevent nausea and vomiting after your treatment, we encourage you to take your nausea medication Other as ordered by Dr. Irene Limbo.   If you develop nausea and vomiting that is not controlled by your nausea medication, call the clinic.   BELOW ARE SYMPTOMS THAT SHOULD BE REPORTED IMMEDIATELY:  *FEVER GREATER THAN 100.5 F  *CHILLS WITH OR WITHOUT FEVER  NAUSEA AND VOMITING THAT IS NOT CONTROLLED WITH YOUR NAUSEA MEDICATION  *UNUSUAL SHORTNESS OF BREATH  *UNUSUAL BRUISING OR BLEEDING  TENDERNESS IN MOUTH AND THROAT WITH OR WITHOUT PRESENCE OF ULCERS  *URINARY PROBLEMS  *BOWEL PROBLEMS  UNUSUAL RASH Items with * indicate a potential emergency and should be followed up as soon as possible.  Feel free to call the clinic should you have any questions or concerns. The clinic phone number is [phone].  Please show the [place] at check-in to the Emergency Department and triage nurse.

## 2017-07-30 ENCOUNTER — Encounter: Payer: Self-pay | Admitting: Physical Therapy

## 2017-07-30 NOTE — Therapy (Signed)
[facility] [address] [place], [place], [address] Phone: [phone]   Fax:  [phone]  Patient Details  Name: Scott Gallagher MRN: [id number] Date of Birth: 03/13/1971 Referring Provider:  No ref. provider found  Encounter Date: 07/30/2017  PHYSICAL THERAPY DISCHARGE SUMMARY  Visits from Start of Care: 2  Current functional level related to goals / functional outcomes: All goals met, positional vertigo resolved and pt demonstrated independence with Epley maneuver for home/self-treatment.   Remaining deficits: None   Education / Equipment: HEP  Plan: Patient agrees to discharge.  Patient goals were met. Patient is being discharged due to meeting the stated rehab goals.  ?????    Rico Junker, PT, DPT 07/30/17    8:35 AM    [facility] [address] [place], [place], [address] Phone: [phone]   Fax:  [phone]

## 2017-08-03 DIAGNOSIS — Z85038 Personal history of other malignant neoplasm of large intestine: Secondary | ICD-10-CM | POA: Diagnosis not present

## 2017-08-05 ENCOUNTER — Ambulatory Visit (HOSPITAL_BASED_OUTPATIENT_CLINIC_OR_DEPARTMENT_OTHER): Payer: 59

## 2017-08-05 ENCOUNTER — Other Ambulatory Visit (HOSPITAL_BASED_OUTPATIENT_CLINIC_OR_DEPARTMENT_OTHER): Payer: 59

## 2017-08-05 VITALS — BP 125/82 | Temp 98.7°F | Resp 18 | Wt 253.0 lb

## 2017-08-05 DIAGNOSIS — Z5111 Encounter for antineoplastic chemotherapy: Secondary | ICD-10-CM | POA: Diagnosis not present

## 2017-08-05 DIAGNOSIS — Z7189 Other specified counseling: Secondary | ICD-10-CM

## 2017-08-05 DIAGNOSIS — C787 Secondary malignant neoplasm of liver and intrahepatic bile duct: Principal | ICD-10-CM

## 2017-08-05 DIAGNOSIS — C189 Malignant neoplasm of colon, unspecified: Secondary | ICD-10-CM

## 2017-08-05 DIAGNOSIS — C786 Secondary malignant neoplasm of retroperitoneum and peritoneum: Secondary | ICD-10-CM

## 2017-08-05 DIAGNOSIS — D696 Thrombocytopenia, unspecified: Secondary | ICD-10-CM

## 2017-08-05 LAB — CBC & DIFF AND RETIC
BASO%: 0.4 % (ref 0.0–2.0)
Basophils Absolute: 0 10*3/uL (ref 0.0–0.1)
EOS%: 2.6 % (ref 0.0–7.0)
Eosinophils Absolute: 0.1 10*3/uL (ref 0.0–0.5)
HCT: 42.2 % (ref 38.4–49.9)
HEMOGLOBIN: 14.3 g/dL (ref 13.0–17.1)
IMMATURE RETIC FRACT: 4.3 % (ref 3.00–10.60)
LYMPH%: 18.2 % (ref 14.0–49.0)
MCH: 32.1 pg (ref 27.2–33.4)
MCHC: 33.9 g/dL (ref 32.0–36.0)
MCV: 94.8 fL (ref 79.3–98.0)
MONO#: 0.5 10*3/uL (ref 0.1–0.9)
MONO%: 10.5 % (ref 0.0–14.0)
NEUT#: 3.5 10*3/uL (ref 1.5–6.5)
NEUT%: 68.3 % (ref 39.0–75.0)
PLATELETS: 97 10*3/uL — AB (ref 140–400)
RBC: 4.45 10*6/uL (ref 4.20–5.82)
RDW: 14.8 % — ABNORMAL HIGH (ref 11.0–14.6)
Retic %: 1.59 % (ref 0.80–1.80)
Retic Ct Abs: 70.76 10*3/uL (ref 34.80–93.90)
WBC: 5.1 10*3/uL (ref 4.0–10.3)
lymph#: 0.9 10*3/uL (ref 0.9–3.3)

## 2017-08-05 LAB — COMPREHENSIVE METABOLIC PANEL
ALBUMIN: 4 g/dL (ref 3.5–5.0)
ALK PHOS: 140 U/L (ref 40–150)
ALT: 39 U/L (ref 0–55)
ANION GAP: 11 meq/L (ref 3–11)
AST: 32 U/L (ref 5–34)
BILIRUBIN TOTAL: 0.73 mg/dL (ref 0.20–1.20)
BUN: 10.3 mg/dL (ref 7.0–26.0)
CO2: 23 mEq/L (ref 22–29)
Calcium: 9.4 mg/dL (ref 8.4–10.4)
Chloride: 104 mEq/L (ref 98–109)
Creatinine: 1 mg/dL (ref 0.7–1.3)
Glucose: 295 mg/dl — ABNORMAL HIGH (ref 70–140)
Potassium: 4 mEq/L (ref 3.5–5.1)
Sodium: 138 mEq/L (ref 136–145)
TOTAL PROTEIN: 6.8 g/dL (ref 6.4–8.3)

## 2017-08-05 MED ORDER — PALONOSETRON HCL INJECTION 0.25 MG/5ML
0.2500 mg | Freq: Once | INTRAVENOUS | Status: AC
  Administered 2017-08-05: 0.25 mg via INTRAVENOUS

## 2017-08-05 MED ORDER — LEUCOVORIN CALCIUM INJECTION 350 MG
400.0000 mg/m2 | Freq: Once | INTRAMUSCULAR | Status: AC
  Administered 2017-08-05: 964 mg via INTRAVENOUS
  Filled 2017-08-05: qty 48.2

## 2017-08-05 MED ORDER — SODIUM CHLORIDE 0.9 % IV SOLN
2400.0000 mg/m2 | INTRAVENOUS | Status: DC
  Administered 2017-08-05: 5800 mg via INTRAVENOUS
  Filled 2017-08-05: qty 116

## 2017-08-05 MED ORDER — DEXTROSE 5 % IV SOLN
Freq: Once | INTRAVENOUS | Status: AC
  Administered 2017-08-05: 09:00:00 via INTRAVENOUS

## 2017-08-05 MED ORDER — DEXAMETHASONE SODIUM PHOSPHATE 10 MG/ML IJ SOLN
10.0000 mg | Freq: Once | INTRAMUSCULAR | Status: AC
  Administered 2017-08-05: 10 mg via INTRAVENOUS

## 2017-08-05 MED ORDER — PALONOSETRON HCL INJECTION 0.25 MG/5ML
INTRAVENOUS | Status: AC
  Filled 2017-08-05: qty 5

## 2017-08-05 MED ORDER — DEXAMETHASONE SODIUM PHOSPHATE 10 MG/ML IJ SOLN
INTRAMUSCULAR | Status: AC
  Filled 2017-08-05: qty 1

## 2017-08-05 NOTE — Patient Instructions (Signed)
[facility] Discharge Instructions for Patients Receiving Chemotherapy  Today you received the following chemotherapy agents Leucovorin and 5FU  To help prevent nausea and vomiting after your treatment, we encourage you to take your nausea medication as directed If you develop nausea and vomiting that is not controlled by your nausea medication, call the clinic.   BELOW ARE SYMPTOMS THAT SHOULD BE REPORTED IMMEDIATELY:  *FEVER GREATER THAN 100.5 F  *CHILLS WITH OR WITHOUT FEVER  NAUSEA AND VOMITING THAT IS NOT CONTROLLED WITH YOUR NAUSEA MEDICATION  *UNUSUAL SHORTNESS OF BREATH  *UNUSUAL BRUISING OR BLEEDING  TENDERNESS IN MOUTH AND THROAT WITH OR WITHOUT PRESENCE OF ULCERS  *URINARY PROBLEMS  *BOWEL PROBLEMS  UNUSUAL RASH Items with * indicate a potential emergency and should be followed up as soon as possible.  Feel free to call the clinic should you have any questions or concerns. The clinic phone number is [phone].  Please show the CHEMO ALERT CARD at check-in to the Emergency Department and triage nurse.   

## 2017-08-05 NOTE — Progress Notes (Signed)
Per Dr. Irene Limbo okay to treat pt with abnormal labs and plt of 97

## 2017-08-07 ENCOUNTER — Ambulatory Visit (HOSPITAL_BASED_OUTPATIENT_CLINIC_OR_DEPARTMENT_OTHER): Payer: 59

## 2017-08-07 VITALS — BP 129/78 | HR 88 | Temp 98.1°F | Resp 18

## 2017-08-07 DIAGNOSIS — C189 Malignant neoplasm of colon, unspecified: Secondary | ICD-10-CM | POA: Diagnosis not present

## 2017-08-07 DIAGNOSIS — Z7189 Other specified counseling: Secondary | ICD-10-CM

## 2017-08-07 DIAGNOSIS — C787 Secondary malignant neoplasm of liver and intrahepatic bile duct: Secondary | ICD-10-CM

## 2017-08-07 MED ORDER — HEPARIN SOD (PORK) LOCK FLUSH 100 UNIT/ML IV SOLN
500.0000 [IU] | Freq: Once | INTRAVENOUS | Status: AC | PRN
  Administered 2017-08-07: 500 [IU]
  Filled 2017-08-07: qty 5

## 2017-08-07 MED ORDER — SODIUM CHLORIDE 0.9% FLUSH
10.0000 mL | INTRAVENOUS | Status: DC | PRN
  Administered 2017-08-07: 10 mL
  Filled 2017-08-07: qty 10

## 2017-08-18 NOTE — Progress Notes (Signed)
[name]    HEMATOLOGY/ONCOLOGY CLINIC NOTE  Date of Service: 08/19/17  Patient Care Team: [name], MD as PCP - General (Family Medicine) [name] M.D. 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 KRAS mutated BRAF, NRAS mutation neg MSI Stable.  PET/CT scan done on 06/25/2016 Show multiple foci of hypermetabolic hepatic metastases (atleast 4-5) and 2 hypermetabolic peritoneal nodules along the dorsal peritoneal surface concerning for peritoneal metastases.  MRI Liver 07/27/2016 - Multiple small liver metastases throughout the right and left hepatic lobes, largest measuring 2.1 cm. 1.2 cm enhancing peritoneal nodule in left paracolic gutter, suspicious for peritoneal metastasis. Other small peritoneal nodules better visualized on recent PET-CT which showed hypermetabolic activity, also suspicious for peritoneal metastases.   CT CHEST WO CONTRAST 10/12/2016  [facility] Result Impression   1. Congenital variant of bilateral middle lobes. 2. Groundglass opacification with regions of bronchiectasis in the right lower lobe adjacent to the fissure, consistent with inflammatory/infectious changes. 3. No definitive evidence of metastatic disease to the chest. 4. Probable sebaceous cyst in the subcutaneous tissues beneath the left anterior chest.   MR ABDOMEN AND PELVIS OMVEHMCN FOLLOW UP2/26/2018 [facility] Result Impression    Decreased size of multiple hepatic lesions and two peritoneal nodules compared to outside MRI 07/27/2016. No new lesions identified in  the abdomen or pelvis.     PREVIOUS TREATMENT  FOLFOX x 10 cycles. 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 (without 5FU bolus - due to thrombocytopenia). S/p Y-90 treatment to 1 hepatic lobe.  Plan for bilobar hepatic Y90 radio-embolization.  INTERVAL HISTORY   Scott Gallagher is here for follow-up for his metastatic colon cancer and for cycle 28 of 5FU/leucovorin for maintenance therapy. He presents to the clinic today accompanied by his wife. He notes he has pain in his right flank where he believes where his anastomosis might be. He has pain off and on since procedure but yesterday his pain was significant. He has good and normal bowel movements. He does not know what brought on the pain and notes when he takes a deep breath or bends over this exacerbates the pain. The pain is usually localized but as the pain increased it radiated to a larger area. At it's worse pain was 10/10 and when just sitting currently there is no pain.  He notes since his positional vertigo was treated he notes getting dizzy after getting up for some time again. The dizzy spell can last for 30 minutes and once most of the day. The dizziness mostly happen in the morning. His blood sugar has been elevated but overall stable at home. He denies any new medications. His left ankle still get swollen and sore from his previous injury.    On review of symptoms, pt reports right flank pain increased significantly, dizziness. Pt denies hematuria, blood in stool, pain with bowel movements or dysuria.    MEDICAL HISTORY:  Past Medical History:  Diagnosis Date  . Anemia   . Arthritis   . Cancer (  [place])   . Diabetes mellitus without complication ([place])    diet controlled  . History of blood transfusion   . Hyperlipidemia   . Sleep apnea    cpap  . Wears glasses     SURGICAL  HISTORY: Past Surgical History:  Procedure Laterality Date  . COLON SURGERY    . IR GENERIC HISTORICAL  09/24/2016   IR CV LINE INJECTION 09/24/2016 [facility]  . IR GENERIC HISTORICAL  09/24/2016   IR US GUIDE VASC ACCESS RIGHT 09/24/2016 [facility]  . IR GENERIC HISTORICAL  09/24/2016   IR FLUORO GUIDE CV LINE RIGHT 09/24/2016 [facility]  . IR GENERIC HISTORICAL  09/30/2016   IR FLUORO GUIDE PORT INSERTION RIGHT 09/30/2016 Markus Daft, MD [facility]  . IR GENERIC HISTORICAL  09/30/2016   IR US GUIDE VASC ACCESS RIGHT 09/30/2016 Markus Daft, MD [facility]  . IR GENERIC HISTORICAL  09/30/2016   IR REMOVAL TUN ACCESS W/ PORT W/O FL MOD SED 09/30/2016 Markus Daft, MD [facility]  . LAPAROSCOPIC RIGHT HEMI COLECTOMY Right 06/17/2016   Procedure: LAPAROSCOPIC ASSISTED  RIGHT HEMI COLECTOMY;  Surgeon: [name], MD;  Location: [facility];  Service: General;  Laterality: Right;  . PORTACATH PLACEMENT Left 07/13/2016   Procedure: INSERTION PORT-A-CATH left subclavian;  Surgeon: [name], MD;  Location: [facility];  Service: General;  Laterality: Left;  . SHOULDER ACROMIOPLASTY Right 12/20/2014   Procedure: SHOULDER ACROMIOPLASTY;  Surgeon: Melrose Nakayama, MD;  Location: [place];  Service: Orthopedics;  Laterality: Right;  . SHOULDER ARTHROSCOPY Right 12/20/2014   Procedure: RIGHT ARTHROSCOPY SHOULDER WITH DEBRIDEMENT;  Surgeon: Melrose Nakayama, MD;  Location: [place];  Service: Orthopedics;  Laterality: Right;  . TESTICLE SURGERY     as teen  . TONSILLECTOMY    . WISDOM TOOTH EXTRACTION      SOCIAL HISTORY: Social History   Socioeconomic History  . Marital status: Married    Spouse name: Not on file  . Number of children: Not on file  . Years of education: Not on file  . Highest education level: Not on file  Social Needs  . Financial resource strain: Not on file  . Food insecurity - worry: Not on file  . Food insecurity - inability: Not on file  .  Transportation needs - medical: Not on file  . Transportation needs - non-medical: Not on file  Occupational History  . Occupation: cemetery maintenance  Tobacco Use  . Smoking status: Former Smoker    Packs/day: 1.50    Years: 29.00    Pack years: 43.50    Types: Cigarettes    Last attempt to quit: 12/16/2012    Years since quitting: 4.6  . Smokeless tobacco: Never Used  . Tobacco comment: 1-2 ppd from age [age]y to 2014  Substance and Sexual Activity  . Alcohol use: Yes    Alcohol/week: 1.8 oz    Types: 3 Shots of liquor per week    Comment:  some days  . Drug use: No  . Sexual activity: Not on file  Other Topics Concern  . Not on file  Social History Narrative  . Not on file    FAMILY HISTORY: Family History  Problem Relation Age of Onset  . Diabetes Other   . Hyperlipidemia Other   . Hypertension Other   . Breast cancer Maternal Aunt 70  . Prostate cancer Maternal Uncle 75  . Lung cancer Paternal Uncle 25  . Stroke Maternal Grandfather  60  . Cancer Paternal Grandmother 35       dx cancer of pancreas and colon, unknown if separate primaries  . Heart Problems Paternal Grandfather        d. 50  . Prostate cancer Maternal Uncle 74  . Breast cancer Maternal Aunt 75  . Breast cancer Maternal Aunt 68  . Cervical cancer Maternal Aunt 68  . Pancreatic cancer Maternal Aunt 54       d. 58y; heavy smoker  . Colon cancer Paternal Uncle 56       s/p partial colectomy; dx. second colon cancer at age 38-64    ALLERGIES:  is allergic to penicillins.  MEDICATIONS:  Current Outpatient Medications  Medication Sig Dispense Refill  . b complex vitamins tablet Take 1 tablet by mouth daily.    [name] dexamethasone (DECADRON) 4 MG tablet Take 2 tablets (8 mg total) by mouth daily. Start the day after chemotherapy for 2 days. Take with food. 30 tablet 0  . DULoxetine (CYMBALTA) 60 MG capsule TAKE 1 CAPSULE BY MOUTH EVERY DAY 30 capsule 1  . lidocaine-prilocaine (EMLA) cream Apply to  affected area once 30 g 3  . LORazepam (ATIVAN) 0.5 MG tablet Take 1 tablet (0.5 mg total) by mouth every 8 (eight) hours. For anxiety or sleep 30 tablet 0  . Multiple Vitamin (MULTIVITAMIN) tablet Take 1 tablet by mouth daily.    . ondansetron (ZOFRAN) 8 MG tablet Take 1 tablet (8 mg total) by mouth 2 (two) times daily as needed for refractory nausea / vomiting. Start on day 3 after chemotherapy. 30 tablet 1  . Probiotic Product (PROBIOTIC PO) Take 1 tablet by mouth daily.    . prochlorperazine (COMPAZINE) 10 MG tablet Take 1 tablet (10 mg total) by mouth every 6 (six) hours as needed (Nausea or vomiting). 30 tablet 1  . pyridOXINE (VITAMIN B-6) 100 MG tablet Take 200 mg by mouth daily.    . traMADol (ULTRAM) 50 MG tablet Take 1 tablet (50 mg total) by mouth every 6 (six) hours as needed for moderate pain or severe pain. 60 tablet 0   No current facility-administered medications for this visit.     REVIEW OF SYSTEMS:    10 Point review of Systems was done is negative except as noted above.  PHYSICAL EXAMINATION:  ECOG PERFORMANCE STATUS: 1 - Symptomatic but completely ambulatory Vitals:   08/19/17 1001  BP: 124/85  Pulse: 80  Resp: 18  Temp: 98.6 F (37 C)  TempSrc: Oral  SpO2: 96%  Weight: 253 lb 9.6 oz (115 kg)  Height: 6' 2"  (1.88 m)    GENERAL:alert, in no acute distress and comfortable SKIN: skin color, texture, turgor are normal, no rashes or significant lesions EYES: normal, conjunctiva are pink and non-injected, sclera clear OROPHARYNX:no exudate, no erythema and lips, buccal mucosa, and tongue normal  NECK: supple, no JVD, thyroid normal size, non-tender, without nodularity LYMPH:  no palpable lymphadenopathy in the cervical, axillary or inguinal LUNGS: clear to auscultation with normal respiratory effort HEART: regular rate & rhythm,  no murmurs and no lower extremity edema ABDOMEN: abdomen soft, bowel sounds normoactive , TTP over RUQ of abd and rt flank    Musculoskeletal: no cyanosis of digits and no clubbing  PSYCH: alert & oriented x 3 with fluent speech NEURO: no focal motor/sensory deficits   LABORATORY DATA:  I have reviewed the data as listed  .[name] CBC Latest Ref Rng & Units 08/19/2017 08/05/2017 07/22/2017  WBC 4.0 -  10.3 10e3/uL 5.7 5.1 4.8  Hemoglobin 13.0 - 17.1 g/dL 14.2 14.3 14.7  Hematocrit 38.4 - 49.9 % 41.2 42.2 43.4  Platelets 140 - 400 10e3/uL 97(L) 97(L) 108(L)   . CMP Latest Ref Rng & Units 08/19/2017 08/05/2017 07/22/2017  Glucose 70 - 140 mg/dl 294(H) 295(H) 179(H)  BUN 7.0 - 26.0 mg/dL 9.0 10.3 9.5  Creatinine 0.7 - 1.3 mg/dL 1.0 1.0 0.9  Sodium 136 - 145 mEq/L 138 138 140  Potassium 3.5 - 5.1 mEq/L 4.0 4.0 4.0  Chloride 101 - 111 mmol/L - - -  CO2 22 - 29 mEq/L 26 23 24   Calcium 8.4 - 10.4 mg/dL 9.4 9.4 9.3  Total Protein 6.4 - 8.3 g/dL 6.7 6.8 6.9  Total Bilirubin 0.20 - 1.20 mg/dL 1.22(H) 0.73 1.05  Alkaline Phos 40 - 150 U/L 134 140 128  AST 5 - 34 U/L 33 32 31  ALT 0 - 55 U/L 53 39 59(H)       Microscopic Comment 2. COLON AND RECTUM (INCLUDING TRANS-ANAL RESECTION): Specimen: Terminal ileum, right colon and appendix Procedure: Segmental resection Tumor site: Proximal ascending colon Specimen integrity: Intact Macroscopic intactness of mesorectum: Not applicable: x Complete: NA Near complete: NA Incomplete: NA Cannot be determined (specify): NA Macroscopic tumor perforation: The mass invades through muscularis propria into pericolonic soft tissue Invasive tumor: Maximum size: 5.5 cm Histologic type(s): Adenocarcinoma Histologic grade and differentiation: G3 G1: well differentiated/low grade 1 of 4 Supplemental copy SUPPLEMENTAL for Chap, Hien ([id number]) Microscopic Comment(continued) G2: moderately differentiated/low grade G3: poorly differentiated/high grade G4: undifferentiated/high grade Type of polyp in which invasive carcinoma arose: Tubular adenoma Microscopic extension of  invasive tumor: The mass invade through the muscularis propria into pericolonic soft tissue Lymph-Vascular invasion: Identified Peri-neural invasion: Identified Tumor deposit(s) (discontinuous extramural extension): Present Resection margins: Proximal margin: Negative Distal margin: Negative Circumferential (radial) (posterior ascending, posterior descending; lateral and posterior mid-rectum; and entire lower 1/3 rectum):Negative Mesenteric margin (sigmoid and transverse): NA Distance closest margin (if all above margins negative): 3.7 cm from the non peritonealized pericolic soft tissue margin Trans-anal resection margins only: Deep margin: NA Mucosal Margin: NA Distance closest mucosal margin (if negative): NA Treatment effect (neo-adjuvant therapy): NA Additional polyp(s): Negative Non-neoplastic findings: Unremarkable Lymph nodes: number examined 18; number positive: 5 Pathologic Staging: pT3, N2a, M1a Ancillary studies: MSI ordered      RADIOGRAPHIC STUDIES: I have personally reviewed the radiological images as listed and agreed with the findings in the report. No results found. 2018 February MR ABDOMEN AND PELVIS WWOTUMOR FOLLOW UP2/26/2018 [facility] Result Impression    Decreased size of multiple hepatic lesions and two peritoneal nodules compared to outside MRI 07/27/2016. No new lesions identified in the abdomen or pelvis.  Result Narrative  MR ABDOMEN AND PELVIS WITH AND WITHOUT CONTRAST (TUMOR FOLLOW-UP PROTOCOL), 10/12/2016 5:59 PM   INDICATION: liver and peritoneal colon cancer metastases \ C18.9 Metastatic colon cancer to liver (HCC) \ C78.7 Metastatic colon cancer to liver ([place]) \ C78.6 Peritoneal carcinomatosis ([place]) \ C80.1 Peritoneal carcinomatosis ([place])   ADDITIONAL HISTORY: History of stage IV colorectal cancer status post laparoscopic right hemicolectomy on 06/17/2016. At time of surgery, tumor nodule in the right lobe of the  liver was positive on biopsy for adenocarcinoma. Patient currently undergoing chemotherapy.  COMPARISON: Outside studies including CT chest abdomen pelvis 05/23/2016, PET CT 07/15/2016, and MR abdomen 07/27/2016   TECHNIQUE: Multiplanar, multisequence MR images of the abdomen and pelvis were obtained before and after intravenous  administration of gadolinium-based contrast.   FINDINGS:  LOWER CHEST Heart: Normal size. No pericardial effusion. Lungs/pleura: No masses, consolidations, or effusions.  ABDOMEN Liver: Liver is enlarged, measuring 20 cm in greatest craniocaudal dimension. Hepatic steatosis. Multiple T2 hyperintense and T1 iso to hypointense lesions throughout the liver as marked in series 16, decreased in size compared to outside MR 07/27/2016. Largest lesion is in segment 8 and measures 1.4 cm and demonstrates peripheral enhancement. No new lesion. Gallbladder: Normal. No stones or inflammatory changes. Biliary: No obstruction. Spleen: Spleen is mildly enlarged, measuring 13.8 cm in greatest length. Pancreas: Normal. Adrenals: Normal. Kidneys: Multiple T2 hyperintense nonenhancing lesions in the lower pole of the kidneys bilaterally, consistent with cysts. No hydronephrosis. Stomach/bowel: Right hemicolectomy. Peritoneum: Decreased size of peritoneal nodule in the left paracolic gutter measuring 6 mm (series 10, image 45). Decreased size of peritoneal nodule in the left lower quadrant along the ventral abdominal wall measuring 8 mm (series 23, image 13). No apparent new measurable lesions. Extraperitoneum: Within normal limits. Vascular: Within normal limits.  PELVIS Ureters: Within normal limits. Bladder: Within normal limits. Reproductive system: Within normal limits.  MSK: Lower midline ventral abdominal wall scar.   CT CHEST WO CONTRAST2/26/2018 [facility] Result Impression   1. Congenital variant of bilateral middle lobes. 2. Groundglass  opacification with regions of bronchiectasis in the right lower lobe adjacent to the fissure, consistent with inflammatory/infectious changes. 3. No definitive evidence of metastatic disease to the chest. 4. Probable sebaceous cyst in the subcutaneous tissues beneath the left anterior chest.  Result Narrative  CT CHEST WO CONTRAST, 10/12/2016 3:24 PM  INDICATION:stage IV colon cancer \ C18.9 Metastatic colon cancer to liver ([place]) \ C78.7 Metastatic colon cancer to liver ([place]) \ C78.6 Peritoneal carcinomatosis ([place]) \ C80.1 Peritoneal carcinomatosis ([place])  COMPARISON: None  TECHNIQUE: Multislice axial images were obtained through the chest without administration of iodinated intravenous contrast material. Multi-planar reformatted images were generated for additional analysis. Nongated technique limits cardiac detail.  All CT scans at [facility] and [place] are performed using dose optimization techniques as appropriate to a performed exam, including but not limited to one or more of the following: automated exposure control, adjustment of the mA and/or kV according to patient size, use of iterative reconstruction technique. In addition, [facility] is participating in the [place] program which will further assist [place] in optimizing patient radiation exposure.   FINDINGS:   Thoracic inlet/central airways: Parenchyma is within normal limits for age. The trachea is patent. Mediastinum/hila/axilla: Scattered, nonpathologically enlarged nodes. Heart/vessels: Normal heart size. Trace pericardial effusion. Coronary artery calcifications.. The tip of the central line is in the distal SVC. Lungs/pleura: There are bilateral middle lobes. Subtle groundglass opacification surrounding the posterior aspect of the right major fissure. This is seen in association with some bronchiectasis. Upper abdomen: See concurrent MRI study. Chest wall/MSK:  Multi-segmented sternum. 1.5 cm low-density soft tissue nodule in the subcutaneous tissues beneath the left anterior chest.     MR LIVER WWO CONTRAST5/18/2018 [facility] Result Impression   1.When compared to the February 2018 MRI, there has been no interval change in the multiple T2 hyperintense lesions scattered throughout the liver compatible with metastases. When compared to the initial MRI performed December 2017, lesions have decreased in size. 2.No evidence of new hepatic or peritoneal lesions.    CT CHEST ABDOMEN PELVIS W CONTRAST (ROUTINE)01/01/2017 [facility] Result Impression  1. Most of the small hepatic lesions noted on prior MRI are not seen on today's CT, likely due to technical differences inherent to the modalities. The largest lesion noted in segment 8 on the prior examination is unchanged in size. 2. Tiny implant along the upper left paracolic gutter, quite similar to the February 2018 MRI. No ascites or new peritoneal lesions are otherwise evident. 3. Status post right hemicolectomy. 4. Hepatic steatosis. 5. Additional ancillary findings as above.   MR LIVER WWO CONTRAST 07/01/2017 Result Impression   1.Three T2 hyperintense metastatic lesions are seen in the liver, with the largest again in segment 8. These appear unchanged from prior, again without appreciable enhancement or restricted diffusion. Several of the previously noted tiny lesions are no longer visualized. No new lesions identified.  2.Treatment related changes in the posterior right hepatic lobe. 3.A peritoneal nodule adjacent to the splenic flexure is now more conspicuous compared to the prior study but appears similar to more remote imaging and again could relate to neoplastic disease, although is indeterminate.     ASSESSMENT & PLAN:   47 year old Caucasian male with  1)  Stage IV (pT3, N2a, M1a) Grade 3 invasive adenocarcinoma of the  ascending colon with lymphovascular and perineural invasion with slight leg nodules biopsy-proven liver metastasis. KRAS mutated BRAF, NRAS mutation neg MSI Stable.  PET/CT scan done on 06/25/2016 Show multiple foci of hypermetabolic hepatic metastases (atleast 4-5) and 2 hypermetabolic peritoneal nodules along the dorsal peritoneal surface concerning for peritoneal metastases.  MRI Liver 07/27/2016 - Multiple small liver metastases throughout the right and left hepatic lobes, largest measuring 2.1 cm. 1.2 cm enhancing peritoneal nodule in left paracolic gutter, suspicious for peritoneal metastasis. Other small peritoneal nodules better visualized on recent PET-CT which showed hypermetabolic activity, also suspicious for peritoneal metastases.   MRI Abd 10/12/2016 at [facility] --shows improvement in all the liver and the 2 peritoneal lesions and no new lesions.  MRI liver and CT C/A/P on 01/01/2017 as noted above showed improved/stable disease.   MRI Liver 07/01/2017 at [facility] -  1.Three T2 hyperintense metastatic lesions are seen in the liver, with the largest again in segment 8. These appear unchanged from prior, again without appreciable enhancement or restricted diffusion. Several of the previously noted tiny lesions are no longer visualized. No new lesions identified.  2.Treatment related changes in the posterior right hepatic lobe. 3.A peritoneal nodule adjacent to the splenic flexure is now more conspicuous compared to the prior study but appears similar to more remote imaging and again could relate to neoplastic disease, although is indeterminate.  2) Mild thrombocytopenia PLT improved from 84k to 101k (already have held 5FU bolus dose).  -Platelets at 97k as of today, 08/19/2017. Platelets have continued to remain stable.  PLAN  -no overt prohibitive toxicities from 5FU/leucovorin except mild thrombocytopenia which continues to improve. -continue 5FU/LV without bolus held today tillRUQ  abdominal pain is evaluated -Abdominal X-ray today - no overt evidence of obstruction or perforation. CT abd/pelvis tomorrow Reschedule chemotherapy from today - out 1 week and adjust other scheduled treatments q2weeks from the new date. Labs with each chemotherapy RTC with Dr Irene Limbo in 1 week with next chemotherapy  -Continue f/u with [facility] for serial hepatic Y90 radio-embolization for better control of hepatic metastatic disease.  (follows with Dr Pecola Lawless IR at [facility] for Y-90 treatments) -MRI results noted -grade 1 neuropathy stable/improved--continue Cymbalta 24m po daily. --continue maintenance 5FU/leucovorin q2weeks. Might need to switch to q3weeks if thrombocytopenia becomes limiting <75k  or will cut down 5FU dose. -Given his right abdominal pain, he will do a xray today and CT AP scan this week to rule out obstruction and further investigate cause. If pain becomes significant again he should present to the ED.  -Will postpone 1 week  His 5FU/LV    #2 Iron deficiency anemia due to GI bleeding from the tumor an some blood loss with surgery.  Iron def resolved. Lab Results  Component Value Date   IRON 302 (H) 09/23/2016   TIBC 481 (H) 09/23/2016   IRONPCTSAT 63 (H) 09/23/2016   (Iron and TIBC)  Lab Results  Component Value Date   FERRITIN 108 12/31/2016   Plan  -now off PO iron   #5 Cervalgia due to DDD -tramadol prn  #6  Patient Active Problem List   Diagnosis Date Noted  . Counseling regarding advanced care planning and goals of care 05/27/2017  . Chemotherapy-induced neuropathy ([place]) 11/18/2016  . Genetic testing 10/30/2016  . Port catheter in place 09/25/2016  . Family history of colon cancer 07/24/2016  . Family history of prostate cancer 07/24/2016  . Metastatic colon cancer to liver ([place]) 07/01/2016  . Right Colon cancer metastasized to liver ([place]) 06/17/2016  . Chronic GI bleeding 05/29/2016  . Diabetes mellitus type 2, diet-controlled ([place]) 05/22/2016  . OSA  (obstructive sleep apnea) 05/22/2016  . Hyperlipidemia 05/22/2016  . Anemia due to blood loss, chronic   . Antineoplastic chemotherapy induced pancytopenia (CODE) ([place]) 05/21/2016  Plan:  -Continue follow-up with primary care physician [name], MD for optimization of diabetes management. Might need basal insulin if his blood sugars are elevated with chemotherapy/ steroids. -Continue use of CPAP for sleep apnea.   All of the patients questions were answered with apparent satisfaction. The patient knows to call the clinic with any problems, questions or concerns.  I spent 20 minutes counseling the patient face to face. The total time spent in the appointment was 25 minutes and more than 50% was on counseling and direct patient cares.    Sullivan Lone MD [place] AAHIVMS [facility] Hematology/Oncology Physician [place] [place]  (Office):       [phone] (Work cell):  [phone] (Fax):           [phone]  This document serves as a record of services personally performed by Sullivan Lone, MD. It was created on his behalf by Joslyn Devon, a trained medical scribe. The creation of this record is based on the scribe's personal observations and the provider's statements to them.    .I have reviewed the above documentation for accuracy and completeness, and I agree with the above. Brunetta Genera MD MS

## 2017-08-19 ENCOUNTER — Ambulatory Visit: Payer: 59

## 2017-08-19 ENCOUNTER — Other Ambulatory Visit: Payer: 59

## 2017-08-19 ENCOUNTER — Inpatient Hospital Stay: Payer: 59 | Attending: Hematology | Admitting: Hematology

## 2017-08-19 ENCOUNTER — Other Ambulatory Visit (HOSPITAL_BASED_OUTPATIENT_CLINIC_OR_DEPARTMENT_OTHER): Payer: 59

## 2017-08-19 ENCOUNTER — Encounter: Payer: Self-pay | Admitting: Hematology

## 2017-08-19 ENCOUNTER — Ambulatory Visit (HOSPITAL_COMMUNITY)
Admission: RE | Admit: 2017-08-19 | Discharge: 2017-08-19 | Disposition: A | Discharge status: Home / Self Care | Payer: 59 | Source: Ambulatory Visit | Attending: Hematology | Admitting: Hematology

## 2017-08-19 ENCOUNTER — Telehealth: Payer: Self-pay

## 2017-08-19 VITALS — BP 124/85 | HR 80 | Temp 98.6°F | Resp 18 | Ht 74.0 in | Wt 253.6 lb

## 2017-08-19 DIAGNOSIS — C786 Secondary malignant neoplasm of retroperitoneum and peritoneum: Secondary | ICD-10-CM

## 2017-08-19 DIAGNOSIS — Z8049 Family history of malignant neoplasm of other genital organs: Secondary | ICD-10-CM | POA: Insufficient documentation

## 2017-08-19 DIAGNOSIS — E785 Hyperlipidemia, unspecified: Secondary | ICD-10-CM | POA: Insufficient documentation

## 2017-08-19 DIAGNOSIS — Z800 Family history of malignant neoplasm of digestive organs: Secondary | ICD-10-CM | POA: Insufficient documentation

## 2017-08-19 DIAGNOSIS — Z79899 Other long term (current) drug therapy: Secondary | ICD-10-CM | POA: Insufficient documentation

## 2017-08-19 DIAGNOSIS — Z7189 Other specified counseling: Secondary | ICD-10-CM

## 2017-08-19 DIAGNOSIS — R112 Nausea with vomiting, unspecified: Secondary | ICD-10-CM

## 2017-08-19 DIAGNOSIS — Z452 Encounter for adjustment and management of vascular access device: Secondary | ICD-10-CM | POA: Insufficient documentation

## 2017-08-19 DIAGNOSIS — R140 Abdominal distension (gaseous): Secondary | ICD-10-CM

## 2017-08-19 DIAGNOSIS — R1011 Right upper quadrant pain: Secondary | ICD-10-CM | POA: Diagnosis not present

## 2017-08-19 DIAGNOSIS — Z95828 Presence of other vascular implants and grafts: Secondary | ICD-10-CM

## 2017-08-19 DIAGNOSIS — C787 Secondary malignant neoplasm of liver and intrahepatic bile duct: Secondary | ICD-10-CM | POA: Diagnosis not present

## 2017-08-19 DIAGNOSIS — K56609 Unspecified intestinal obstruction, unspecified as to partial versus complete obstruction: Secondary | ICD-10-CM

## 2017-08-19 DIAGNOSIS — C189 Malignant neoplasm of colon, unspecified: Secondary | ICD-10-CM | POA: Diagnosis not present

## 2017-08-19 DIAGNOSIS — D500 Iron deficiency anemia secondary to blood loss (chronic): Secondary | ICD-10-CM

## 2017-08-19 DIAGNOSIS — G4730 Sleep apnea, unspecified: Secondary | ICD-10-CM | POA: Insufficient documentation

## 2017-08-19 DIAGNOSIS — E119 Type 2 diabetes mellitus without complications: Secondary | ICD-10-CM | POA: Insufficient documentation

## 2017-08-19 DIAGNOSIS — Z880 Allergy status to penicillin: Secondary | ICD-10-CM | POA: Insufficient documentation

## 2017-08-19 DIAGNOSIS — Z801 Family history of malignant neoplasm of trachea, bronchus and lung: Secondary | ICD-10-CM | POA: Insufficient documentation

## 2017-08-19 DIAGNOSIS — C7989 Secondary malignant neoplasm of other specified sites: Secondary | ICD-10-CM | POA: Diagnosis not present

## 2017-08-19 DIAGNOSIS — C182 Malignant neoplasm of ascending colon: Secondary | ICD-10-CM | POA: Diagnosis not present

## 2017-08-19 DIAGNOSIS — K760 Fatty (change of) liver, not elsewhere classified: Secondary | ICD-10-CM | POA: Insufficient documentation

## 2017-08-19 DIAGNOSIS — Z87891 Personal history of nicotine dependence: Secondary | ICD-10-CM | POA: Insufficient documentation

## 2017-08-19 DIAGNOSIS — D696 Thrombocytopenia, unspecified: Secondary | ICD-10-CM

## 2017-08-19 DIAGNOSIS — Z9989 Dependence on other enabling machines and devices: Secondary | ICD-10-CM | POA: Insufficient documentation

## 2017-08-19 DIAGNOSIS — Z803 Family history of malignant neoplasm of breast: Secondary | ICD-10-CM | POA: Insufficient documentation

## 2017-08-19 DIAGNOSIS — Z5111 Encounter for antineoplastic chemotherapy: Secondary | ICD-10-CM | POA: Insufficient documentation

## 2017-08-19 DIAGNOSIS — M5030 Other cervical disc degeneration, unspecified cervical region: Secondary | ICD-10-CM | POA: Insufficient documentation

## 2017-08-19 LAB — CBC & DIFF AND RETIC
BASO%: 0.3 % (ref 0.0–2.0)
BASOS ABS: 0 10*3/uL (ref 0.0–0.1)
EOS ABS: 0.1 10*3/uL (ref 0.0–0.5)
EOS%: 2.4 % (ref 0.0–7.0)
HEMATOCRIT: 41.2 % (ref 38.4–49.9)
HEMOGLOBIN: 14.2 g/dL (ref 13.0–17.1)
IMMATURE RETIC FRACT: 3.9 % (ref 3.00–10.60)
LYMPH%: 14.8 % (ref 14.0–49.0)
MCH: 32.1 pg (ref 27.2–33.4)
MCHC: 34.5 g/dL (ref 32.0–36.0)
MCV: 93 fL (ref 79.3–98.0)
MONO#: 0.7 10*3/uL (ref 0.1–0.9)
MONO%: 12.4 % (ref 0.0–14.0)
NEUT#: 4 10*3/uL (ref 1.5–6.5)
NEUT%: 70.1 % (ref 39.0–75.0)
PLATELETS: 97 10*3/uL — AB (ref 140–400)
RBC: 4.43 10*6/uL (ref 4.20–5.82)
RDW: 14.6 % (ref 11.0–14.6)
Retic %: 1.69 % (ref 0.80–1.80)
Retic Ct Abs: 74.87 10*3/uL (ref 34.80–93.90)
WBC: 5.7 10*3/uL (ref 4.0–10.3)
lymph#: 0.9 10*3/uL (ref 0.9–3.3)

## 2017-08-19 LAB — COMPREHENSIVE METABOLIC PANEL
ALBUMIN: 4.1 g/dL (ref 3.5–5.0)
ALK PHOS: 134 U/L (ref 40–150)
ALT: 53 U/L (ref 0–55)
AST: 33 U/L (ref 5–34)
Anion Gap: 10 mEq/L (ref 3–11)
BILIRUBIN TOTAL: 1.22 mg/dL — AB (ref 0.20–1.20)
BUN: 9 mg/dL (ref 7.0–26.0)
CALCIUM: 9.4 mg/dL (ref 8.4–10.4)
CO2: 26 mEq/L (ref 22–29)
Chloride: 102 mEq/L (ref 98–109)
Creatinine: 1 mg/dL (ref 0.7–1.3)
Glucose: 294 mg/dl — ABNORMAL HIGH (ref 70–140)
POTASSIUM: 4 meq/L (ref 3.5–5.1)
Sodium: 138 mEq/L (ref 136–145)
TOTAL PROTEIN: 6.7 g/dL (ref 6.4–8.3)

## 2017-08-19 LAB — CEA (IN HOUSE-CHCC): CEA (CHCC-IN HOUSE): 5.89 ng/mL — AB (ref 0.00–5.00)

## 2017-08-19 MED ORDER — SODIUM CHLORIDE 0.9% FLUSH
10.0000 mL | INTRAVENOUS | Status: DC | PRN
  Administered 2017-08-19: 10 mL via INTRAVENOUS
  Filled 2017-08-19: qty 10

## 2017-08-19 MED ORDER — HEPARIN SOD (PORK) LOCK FLUSH 100 UNIT/ML IV SOLN
500.0000 [IU] | Freq: Once | INTRAVENOUS | Status: AC | PRN
  Administered 2017-08-19: 500 [IU] via INTRAVENOUS
  Filled 2017-08-19: qty 5

## 2017-08-19 NOTE — Telephone Encounter (Signed)
Pt CT of abdomen and pelvis schedule for Monday (08/23/16) at 0800. Pt to have nothing but water after 0400. 1st bowel prep to be taken at 0600 and 2nd bowel prep at 0700. Pt to arrive at 0745 for scan. Pt verbalized understanding of all information and confirmed that he received bowel prep before leaving the office today.

## 2017-08-20 ENCOUNTER — Telehealth: Payer: Self-pay | Admitting: Hematology

## 2017-08-20 NOTE — Telephone Encounter (Signed)
Scheduled appt per 1/3 los - Patient is aware of appt change - my chart active.

## 2017-08-23 ENCOUNTER — Encounter (HOSPITAL_COMMUNITY): Payer: Self-pay

## 2017-08-23 ENCOUNTER — Ambulatory Visit (HOSPITAL_COMMUNITY)
Admission: RE | Admit: 2017-08-23 | Discharge: 2017-08-23 | Disposition: A | Discharge status: Home / Self Care | Payer: 59 | Source: Ambulatory Visit | Attending: Hematology | Admitting: Hematology

## 2017-08-23 DIAGNOSIS — R112 Nausea with vomiting, unspecified: Secondary | ICD-10-CM | POA: Diagnosis present

## 2017-08-23 DIAGNOSIS — K56609 Unspecified intestinal obstruction, unspecified as to partial versus complete obstruction: Secondary | ICD-10-CM

## 2017-08-23 DIAGNOSIS — I700 Atherosclerosis of aorta: Secondary | ICD-10-CM | POA: Diagnosis not present

## 2017-08-23 DIAGNOSIS — R140 Abdominal distension (gaseous): Secondary | ICD-10-CM | POA: Insufficient documentation

## 2017-08-23 DIAGNOSIS — K760 Fatty (change of) liver, not elsewhere classified: Secondary | ICD-10-CM | POA: Insufficient documentation

## 2017-08-23 DIAGNOSIS — R1110 Vomiting, unspecified: Secondary | ICD-10-CM | POA: Diagnosis not present

## 2017-08-23 MED ORDER — IOPAMIDOL (ISOVUE-300) INJECTION 61%
100.0000 mL | Freq: Once | INTRAVENOUS | Status: AC | PRN
  Administered 2017-08-23: 100 mL via INTRAVENOUS

## 2017-08-23 MED ORDER — IOPAMIDOL (ISOVUE-300) INJECTION 61%
INTRAVENOUS | Status: AC
  Filled 2017-08-23: qty 100

## 2017-08-26 ENCOUNTER — Inpatient Hospital Stay: Payer: 59

## 2017-08-26 ENCOUNTER — Ambulatory Visit: Payer: 59

## 2017-08-26 ENCOUNTER — Inpatient Hospital Stay (HOSPITAL_BASED_OUTPATIENT_CLINIC_OR_DEPARTMENT_OTHER): Payer: 59 | Admitting: Hematology

## 2017-08-26 VITALS — BP 127/84 | HR 80 | Temp 98.8°F | Resp 16

## 2017-08-26 VITALS — BP 118/73 | HR 85 | Temp 98.1°F | Resp 16

## 2017-08-26 DIAGNOSIS — Z79899 Other long term (current) drug therapy: Secondary | ICD-10-CM | POA: Diagnosis not present

## 2017-08-26 DIAGNOSIS — R1011 Right upper quadrant pain: Secondary | ICD-10-CM

## 2017-08-26 DIAGNOSIS — Z880 Allergy status to penicillin: Secondary | ICD-10-CM | POA: Diagnosis not present

## 2017-08-26 DIAGNOSIS — Z803 Family history of malignant neoplasm of breast: Secondary | ICD-10-CM

## 2017-08-26 DIAGNOSIS — Z8049 Family history of malignant neoplasm of other genital organs: Secondary | ICD-10-CM | POA: Diagnosis not present

## 2017-08-26 DIAGNOSIS — E119 Type 2 diabetes mellitus without complications: Secondary | ICD-10-CM | POA: Diagnosis not present

## 2017-08-26 DIAGNOSIS — C787 Secondary malignant neoplasm of liver and intrahepatic bile duct: Secondary | ICD-10-CM | POA: Diagnosis not present

## 2017-08-26 DIAGNOSIS — Z801 Family history of malignant neoplasm of trachea, bronchus and lung: Secondary | ICD-10-CM

## 2017-08-26 DIAGNOSIS — R140 Abdominal distension (gaseous): Secondary | ICD-10-CM

## 2017-08-26 DIAGNOSIS — Z5111 Encounter for antineoplastic chemotherapy: Secondary | ICD-10-CM | POA: Diagnosis not present

## 2017-08-26 DIAGNOSIS — C786 Secondary malignant neoplasm of retroperitoneum and peritoneum: Secondary | ICD-10-CM | POA: Diagnosis not present

## 2017-08-26 DIAGNOSIS — C189 Malignant neoplasm of colon, unspecified: Secondary | ICD-10-CM

## 2017-08-26 DIAGNOSIS — M5030 Other cervical disc degeneration, unspecified cervical region: Secondary | ICD-10-CM

## 2017-08-26 DIAGNOSIS — D696 Thrombocytopenia, unspecified: Secondary | ICD-10-CM

## 2017-08-26 DIAGNOSIS — Z87891 Personal history of nicotine dependence: Secondary | ICD-10-CM

## 2017-08-26 DIAGNOSIS — Z7189 Other specified counseling: Secondary | ICD-10-CM

## 2017-08-26 DIAGNOSIS — Z800 Family history of malignant neoplasm of digestive organs: Secondary | ICD-10-CM | POA: Diagnosis not present

## 2017-08-26 DIAGNOSIS — K760 Fatty (change of) liver, not elsewhere classified: Secondary | ICD-10-CM | POA: Diagnosis not present

## 2017-08-26 DIAGNOSIS — Z9989 Dependence on other enabling machines and devices: Secondary | ICD-10-CM | POA: Diagnosis not present

## 2017-08-26 DIAGNOSIS — E785 Hyperlipidemia, unspecified: Secondary | ICD-10-CM

## 2017-08-26 DIAGNOSIS — Z452 Encounter for adjustment and management of vascular access device: Secondary | ICD-10-CM | POA: Diagnosis not present

## 2017-08-26 DIAGNOSIS — C182 Malignant neoplasm of ascending colon: Secondary | ICD-10-CM | POA: Diagnosis not present

## 2017-08-26 DIAGNOSIS — G4730 Sleep apnea, unspecified: Secondary | ICD-10-CM | POA: Diagnosis not present

## 2017-08-26 LAB — RETICULOCYTES
RBC.: 4.5 MIL/uL (ref 4.20–5.82)
RETIC COUNT ABSOLUTE: 72 10*3/uL (ref 34.8–93.9)
RETIC CT PCT: 1.6 % (ref 0.8–1.8)

## 2017-08-26 LAB — COMPREHENSIVE METABOLIC PANEL
ALK PHOS: 131 U/L (ref 40–150)
ALT: 74 U/L — AB (ref 0–55)
AST: 45 U/L — AB (ref 5–34)
Albumin: 4.1 g/dL (ref 3.5–5.0)
Anion gap: 10 (ref 3–11)
BILIRUBIN TOTAL: 1.2 mg/dL (ref 0.2–1.2)
BUN: 9 mg/dL (ref 7–26)
CALCIUM: 9.3 mg/dL (ref 8.4–10.4)
CO2: 23 mmol/L (ref 22–29)
CREATININE: 0.91 mg/dL (ref 0.70–1.30)
Chloride: 104 mmol/L (ref 98–109)
GFR calc Af Amer: >60 mL/min (ref >60)
Glucose, Bld: 246 mg/dL — ABNORMAL HIGH (ref 70–140)
Potassium: 4.2 mmol/L (ref 3.5–5.1)
Sodium: 137 mmol/L (ref 136–145)
Total Protein: 6.9 g/dL (ref 6.4–8.3)

## 2017-08-26 LAB — CBC WITH DIFFERENTIAL/PLATELET
Basophils Absolute: 0 10*3/uL (ref 0.0–0.1)
Basophils Relative: 1 %
EOS ABS: 0.1 10*3/uL (ref 0.0–0.5)
EOS PCT: 3 %
HCT: 42.3 % (ref 38.4–49.9)
Hemoglobin: 14.4 g/dL (ref 13.0–17.1)
Lymphocytes Relative: 18 %
Lymphs Abs: 0.8 10*3/uL — ABNORMAL LOW (ref 0.9–3.3)
MCH: 32 pg (ref 27.2–33.4)
MCHC: 34 g/dL (ref 32.0–36.0)
MCV: 94 fL (ref 79.3–98.0)
MONO ABS: 0.6 10*3/uL (ref 0.1–0.9)
Monocytes Relative: 13 %
Neutro Abs: 2.8 10*3/uL (ref 1.5–6.5)
Neutrophils Relative %: 65 %
PLATELETS: 137 10*3/uL — AB (ref 140–400)
RBC: 4.5 MIL/uL (ref 4.20–5.82)
RDW: 14.2 % (ref 11.0–15.6)
WBC: 4.3 10*3/uL (ref 4.0–10.3)

## 2017-08-26 MED ORDER — PALONOSETRON HCL INJECTION 0.25 MG/5ML
0.2500 mg | Freq: Once | INTRAVENOUS | Status: AC
  Administered 2017-08-26: 0.25 mg via INTRAVENOUS

## 2017-08-26 MED ORDER — DEXAMETHASONE SODIUM PHOSPHATE 10 MG/ML IJ SOLN
INTRAMUSCULAR | Status: AC
  Filled 2017-08-26: qty 1

## 2017-08-26 MED ORDER — DEXAMETHASONE SODIUM PHOSPHATE 10 MG/ML IJ SOLN
10.0000 mg | Freq: Once | INTRAMUSCULAR | Status: AC
  Administered 2017-08-26: 10 mg via INTRAVENOUS

## 2017-08-26 MED ORDER — PALONOSETRON HCL INJECTION 0.25 MG/5ML
INTRAVENOUS | Status: AC
  Filled 2017-08-26: qty 5

## 2017-08-26 MED ORDER — LEUCOVORIN CALCIUM INJECTION 350 MG
400.0000 mg/m2 | Freq: Once | INTRAVENOUS | Status: AC
  Administered 2017-08-26: 964 mg via INTRAVENOUS
  Filled 2017-08-26: qty 48.2

## 2017-08-26 MED ORDER — SODIUM CHLORIDE 0.9 % IV SOLN
2400.0000 mg/m2 | INTRAVENOUS | Status: DC
  Administered 2017-08-26: 5800 mg via INTRAVENOUS
  Filled 2017-08-26: qty 116

## 2017-08-26 NOTE — Progress Notes (Signed)
[name]    HEMATOLOGY/ONCOLOGY CLINIC NOTE  Date of Service: 08/26/17  Patient Care Team: [name], MD as PCP - General (Family Medicine) [name] M.D. 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 KRAS mutated BRAF, NRAS mutation neg MSI Stable.  PET/CT scan done on 06/25/2016 Show multiple foci of hypermetabolic hepatic metastases (atleast 4-5) and 2 hypermetabolic peritoneal nodules along the dorsal peritoneal surface concerning for peritoneal metastases.  MRI Liver 07/27/2016 - Multiple small liver metastases throughout the right and left hepatic lobes, largest measuring 2.1 cm. 1.2 cm enhancing peritoneal nodule in left paracolic gutter, suspicious for peritoneal metastasis. Other small peritoneal nodules better visualized on recent PET-CT which showed hypermetabolic activity, also suspicious for peritoneal metastases.   CT CHEST WO CONTRAST 10/12/2016  [facility] Result Impression   1. Congenital variant of bilateral middle lobes. 2. Groundglass opacification with regions of bronchiectasis in the right lower lobe adjacent to the fissure, consistent with inflammatory/infectious changes. 3. No definitive evidence of metastatic disease to the chest. 4. Probable sebaceous cyst in the subcutaneous tissues beneath the left anterior chest.   MR ABDOMEN AND PELVIS PYKDXIPJ FOLLOW UP2/26/2018 [facility] Result Impression    Decreased size of multiple hepatic lesions and two peritoneal nodules compared to outside MRI 07/27/2016. No new lesions identified in  the abdomen or pelvis.     PREVIOUS TREATMENT  FOLFOX x 10 cycles. 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 (without 5FU bolus - due to thrombocytopenia). S/p Y-90 treatment to 1 hepatic lobe.  Plan for bilobar hepatic Y90 radio-embolization.  INTERVAL HISTORY   Scott Gallagher is here for follow-up for his metastatic colon cancer and for cycle 29 of 5FU/leucovorin for maintenance therapy. He was seen today  accompanied by his wife. He is doing okay. He continues to have some mild right flank abdominal discomfort, but he adds that it has improved since his last visit. However, his labs are stable. CT abd/pelvis showed no overt new pathology.   MEDICAL HISTORY:  Past Medical History:  Diagnosis Date  . Anemia   . Arthritis   . Cancer ([place])   . Diabetes mellitus without complication (HCC)    diet controlled  . History of blood transfusion   . Hyperlipidemia   . Sleep apnea    cpap  . Wears glasses     SURGICAL HISTORY: Past Surgical History:  Procedure Laterality Date  . COLON SURGERY    . IR GENERIC HISTORICAL  09/24/2016   IR CV LINE INJECTION 09/24/2016 [facility]  . IR GENERIC HISTORICAL  09/24/2016   IR US GUIDE VASC ACCESS RIGHT 09/24/2016 [facility]  . IR GENERIC HISTORICAL  09/24/2016   IR FLUORO GUIDE CV LINE RIGHT 09/24/2016 [facility]  . IR GENERIC HISTORICAL  09/30/2016   IR FLUORO GUIDE PORT INSERTION RIGHT 09/30/2016 Markus Daft, MD [facility]  . IR GENERIC HISTORICAL  09/30/2016   IR US GUIDE VASC ACCESS RIGHT 09/30/2016 Markus Daft, MD [facility]  . IR GENERIC  HISTORICAL  09/30/2016   IR REMOVAL TUN ACCESS W/ PORT W/O FL MOD SED 09/30/2016 Markus Daft, MD [facility]  . LAPAROSCOPIC RIGHT HEMI COLECTOMY Right 06/17/2016   Procedure: LAPAROSCOPIC ASSISTED  RIGHT HEMI COLECTOMY;  Surgeon: [name], MD;  Location: [facility];  Service: General;  Laterality: Right;  . PORTACATH PLACEMENT Left 07/13/2016   Procedure: INSERTION PORT-A-CATH left subclavian;  Surgeon: [name], MD;  Location: [facility];  Service: General;  Laterality: Left;  . SHOULDER ACROMIOPLASTY Right 12/20/2014   Procedure: SHOULDER ACROMIOPLASTY;  Surgeon: Melrose Nakayama, MD;  Location: [place];  Service: Orthopedics;  Laterality: Right;  . SHOULDER ARTHROSCOPY Right 12/20/2014   Procedure: RIGHT ARTHROSCOPY SHOULDER WITH DEBRIDEMENT;  Surgeon: Melrose Nakayama, MD;  Location: [place];  Service: Orthopedics;  Laterality: Right;  . TESTICLE SURGERY     as teen  . TONSILLECTOMY    . WISDOM TOOTH EXTRACTION      SOCIAL HISTORY: Social History   Socioeconomic History  . Marital status: Married    Spouse name: Not on file  . Number of children: Not on file  . Years of education: Not on file  . Highest education level: Not on file  Social Needs  . Financial resource strain: Not on file  . Food insecurity - worry: Not on file  . Food insecurity - inability: Not on file  . Transportation needs - medical: Not on file  . Transportation needs - non-medical: Not on file  Occupational History  . Occupation: cemetery maintenance  Tobacco Use  . Smoking status: Former Smoker    Packs/day: 1.50    Years: 29.00    Pack years: 43.50    Types: Cigarettes    Last attempt to quit: 12/16/2012    Years since quitting: 4.6  . Smokeless tobacco: Never Used  . Tobacco comment: 1-2 ppd from age [age]y to 2014  Substance and Sexual Activity  . Alcohol use: Yes    Alcohol/week: 1.8 oz    Types: 3 Shots of liquor per week    Comment:  some days  . Drug use: No  . Sexual activity: Not on file  Other Topics Concern  . Not on file  Social History Narrative  . Not on file    FAMILY HISTORY: Family History  Problem Relation Age of Onset  . Diabetes Other   . Hyperlipidemia Other   . Hypertension Other   .  Breast cancer Maternal Aunt 70  . Prostate cancer Maternal Uncle 75  . Lung cancer Paternal Uncle 71  . Stroke Maternal Grandfather 72  . Cancer Paternal Grandmother 79       dx cancer of pancreas and colon, unknown if separate primaries  . Heart Problems Paternal Grandfather        d. 38  . Prostate cancer Maternal Uncle 84  . Breast cancer Maternal Aunt 75  . Breast cancer Maternal Aunt 68  . Cervical cancer Maternal Aunt 68  . Pancreatic cancer Maternal Aunt 54       d. 58y; heavy smoker  . Colon cancer Paternal Uncle 52       s/p partial colectomy; dx. second colon cancer at age 58-64    ALLERGIES:  is allergic to penicillins.  MEDICATIONS:  Current Outpatient Medications  Medication Sig Dispense Refill  . b complex vitamins tablet Take 1 tablet by mouth daily.    [name] dexamethasone (DECADRON) 4 MG tablet Take 2 tablets (8 mg total) by  mouth daily. Start the day after chemotherapy for 2 days. Take with food. 30 tablet 0  . DULoxetine (CYMBALTA) 60 MG capsule TAKE 1 CAPSULE BY MOUTH EVERY DAY 30 capsule 1  . lidocaine-prilocaine (EMLA) cream Apply to affected area once 30 g 3  . LORazepam (ATIVAN) 0.5 MG tablet Take 1 tablet (0.5 mg total) by mouth every 8 (eight) hours. For anxiety or sleep 30 tablet 0  . Multiple Vitamin (MULTIVITAMIN) tablet Take 1 tablet by mouth daily.    . ondansetron (ZOFRAN) 8 MG tablet Take 1 tablet (8 mg total) by mouth 2 (two) times daily as needed for refractory nausea / vomiting. Start on day 3 after chemotherapy. 30 tablet 1  . Probiotic Product (PROBIOTIC PO) Take 1 tablet by mouth daily.    . prochlorperazine (COMPAZINE) 10 MG tablet Take 1 tablet (10 mg total) by mouth every 6 (six) hours as needed (Nausea or vomiting). 30 tablet 1  . pyridOXINE (VITAMIN B-6) 100 MG tablet Take 200 mg by mouth daily.    . traMADol (ULTRAM) 50 MG tablet Take 1 tablet (50 mg total) by mouth every 6 (six) hours as needed for moderate pain or severe pain. 60 tablet 0    No current facility-administered medications for this visit.     REVIEW OF SYSTEMS:    10 Point review of Systems was done is negative except as noted above.  PHYSICAL EXAMINATION:  ECOG PERFORMANCE STATUS: 1 - Symptomatic but completely ambulatory VS reviewed in EPIC  GENERAL:alert, in no acute distress and comfortable SKIN: skin color, texture, turgor are normal, no rashes or significant lesions EYES: normal, conjunctiva are pink and non-injected, sclera clear OROPHARYNX:no exudate, no erythema and lips, buccal mucosa, and tongue normal  NECK: supple, no JVD, thyroid normal size, non-tender, without nodularity LYMPH:  no palpable lymphadenopathy in the cervical, axillary or inguinal LUNGS: clear to auscultation with normal respiratory effort HEART: regular rate & rhythm,  no murmurs and no lower extremity edema ABDOMEN: abdomen soft, bowel sounds normoactive , mild TTP over RUQ of abd and rt flank  Musculoskeletal: no cyanosis of digits and no clubbing  PSYCH: alert & oriented x 3 with fluent speech NEURO: no focal motor/sensory deficits   LABORATORY DATA:  I have reviewed the data as listed  .[name] CBC Latest Ref Rng & Units 08/26/2017 08/19/2017 08/05/2017  WBC 4.0 - 10.3 K/uL 4.3 5.7 5.1  Hemoglobin 13.0 - 17.1 g/dL 14.4 14.2 14.3  Hematocrit 38.4 - 49.9 % 42.3 41.2 42.2  Platelets 140 - 400 K/uL 137(L) 97(L) 97(L)   . CMP Latest Ref Rng & Units 08/26/2017 08/19/2017 08/05/2017  Glucose 70 - 140 mg/dL 246(H) 294(H) 295(H)  BUN 7 - 26 mg/dL 9 9.0 10.3  Creatinine 0.70 - 1.30 mg/dL 0.91 1.0 1.0  Sodium 136 - 145 mmol/L 137 138 138  Potassium 3.5 - 5.1 mmol/L 4.2 4.0 4.0  Chloride 98 - 109 mmol/L 104 - -  CO2 22 - 29 mmol/L 23 26 23   Calcium 8.4 - 10.4 mg/dL 9.3 9.4 9.4  Total Protein 6.4 - 8.3 g/dL 6.9 6.7 6.8  Total Bilirubin 0.2 - 1.2 mg/dL 1.2 1.22(H) 0.73  Alkaline Phos 40 - 150 U/L 131 134 140  AST 5 - 34 U/L 45(H) 33 32  ALT 0 - 55 U/L 74(H) 53 39        Microscopic Comment 2. COLON AND RECTUM (INCLUDING TRANS-ANAL RESECTION): Specimen: Terminal ileum, right colon and appendix Procedure: Segmental resection Tumor site:  Proximal ascending colon Specimen integrity: Intact Macroscopic intactness of mesorectum: Not applicable: x Complete: NA Near complete: NA Incomplete: NA Cannot be determined (specify): NA Macroscopic tumor perforation: The mass invades through muscularis propria into pericolonic soft tissue Invasive tumor: Maximum size: 5.5 cm Histologic type(s): Adenocarcinoma Histologic grade and differentiation: G3 G1: well differentiated/low grade 1 of 4 Supplemental copy SUPPLEMENTAL for Schul, Hymen ([id number]) Microscopic Comment(continued) G2: moderately differentiated/low grade G3: poorly differentiated/high grade G4: undifferentiated/high grade Type of polyp in which invasive carcinoma arose: Tubular adenoma Microscopic extension of invasive tumor: The mass invade through the muscularis propria into pericolonic soft tissue Lymph-Vascular invasion: Identified Peri-neural invasion: Identified Tumor deposit(s) (discontinuous extramural extension): Present Resection margins: Proximal margin: Negative Distal margin: Negative Circumferential (radial) (posterior ascending, posterior descending; lateral and posterior mid-rectum; and entire lower 1/3 rectum):Negative Mesenteric margin (sigmoid and transverse): NA Distance closest margin (if all above margins negative): 3.7 cm from the non peritonealized pericolic soft tissue margin Trans-anal resection margins only: Deep margin: NA Mucosal Margin: NA Distance closest mucosal margin (if negative): NA Treatment effect (neo-adjuvant therapy): NA Additional polyp(s): Negative Non-neoplastic findings: Unremarkable Lymph nodes: number examined 18; number positive: 5 Pathologic Staging: pT3, N2a, M1a Ancillary studies: MSI ordered      RADIOGRAPHIC  STUDIES: I have personally reviewed the radiological images as listed and agreed with the findings in the report. Ct Abdomen Pelvis W Contrast  Result Date: 08/23/2017 CLINICAL DATA:  Right upper quadrant pain for several weeks. Abdominal distention. Nausea and vomiting. Followup metastatic colon carcinoma. EXAM: CT ABDOMEN AND PELVIS WITH CONTRAST TECHNIQUE: Multidetector CT imaging of the abdomen and pelvis was performed using the standard protocol following bolus administration of intravenous contrast. CONTRAST:  18m ISOVUE-300 IOPAMIDOL (ISOVUE-300) INJECTION 61% COMPARISON:  07/15/2017 FINDINGS: Lower Chest: No acute findings. Hepatobiliary: Diffuse hepatic steatosis again noted with geographic areas of fatty sparing in the posterior right hepatic lobe and adjacent to the gallbladder fossa. Previously seen small low-attenuation lesion in the anterior segment right hepatic lobe is no longer visualized. Sub-cm capsular nodular density along the inferior margin of the right hepatic lobe following image 74/6 remains stable. No new liver lesions identified. Radio embolization particles again seen within the right hepatic artery. Gallbladder is unremarkable. No evidence of biliary ductal dilatation. Pancreas:  No mass or inflammatory changes. Spleen: Within normal limits in size and appearance. Adrenals/Urinary Tract: No masses identified. Small bilateral renal cysts again noted. No evidence of hydronephrosis. Stomach/Bowel: Stable postop changes from previous right colectomy. No soft tissue masses identified. No evidence of obstruction, inflammatory process or abnormal fluid collections. Vascular/Lymphatic: No pathologically enlarged lymph nodes. No abdominal aortic aneurysm. Aortic atherosclerosis. Reproductive:  No mass or other significant abnormality. Other:  None. Musculoskeletal:  No suspicious bone lesions identified. IMPRESSION: Interval resolution of previously seen small right hepatic lobe lesion.  Stable tiny sub-cm nodule along the capsular surface of the right hepatic lobe. No new or progressive disease identified. Stable hepatic steatosis with areas of fatty sparing. Aortic atherosclerosis. Electronically Signed   By: JEarle GellM.D.   On: 08/23/2017 09:18   Dg Abd 2 Views  Result Date: 08/19/2017 CLINICAL DATA:  Nausea vomiting colon cancer history. Right upper quadrant pain EXAM: ABDOMEN - 2 VIEW COMPARISON:  CT 07/15/2017 FINDINGS: Nonobstructive bowel gas pattern. No air-fluid levels in the bowel. Hemidiaphragm not included on the study. If there is concern of free air, repeat upright view is suggested. Mild amount of stool in the right colon. Surgical embolization coils in the  region of the duodenum. No urinary tract calculi. No acute bony abnormality. IMPRESSION: Mild amount of stool in the colon.  Normal bowel gas pattern. Hemidiaphragm not included on the upright view. Recommend repeat upright view if there is concern of pneumoperitoneum based on clinical evaluation. Electronically Signed   By: Franchot Gallo M.D.   On: 08/19/2017 12:49   2018 February MR ABDOMEN AND PELVIS WWOTUMOR FOLLOW UP2/26/2018 [facility] Result Impression    Decreased size of multiple hepatic lesions and two peritoneal nodules compared to outside MRI 07/27/2016. No new lesions identified in the abdomen or pelvis.  Result Narrative  MR ABDOMEN AND PELVIS WITH AND WITHOUT CONTRAST (TUMOR FOLLOW-UP PROTOCOL), 10/12/2016 5:59 PM   INDICATION: liver and peritoneal colon cancer metastases \ C18.9 Metastatic colon cancer to liver (HCC) \ C78.7 Metastatic colon cancer to liver ([place]) \ C78.6 Peritoneal carcinomatosis ([place]) \ C80.1 Peritoneal carcinomatosis ([place])   ADDITIONAL HISTORY: History of stage IV colorectal cancer status post laparoscopic right hemicolectomy on 06/17/2016. At time of surgery, tumor nodule in the right lobe of the liver was positive on biopsy for adenocarcinoma.  Patient currently undergoing chemotherapy.  COMPARISON: Outside studies including CT chest abdomen pelvis 05/23/2016, PET CT 07/15/2016, and MR abdomen 07/27/2016   TECHNIQUE: Multiplanar, multisequence MR images of the abdomen and pelvis were obtained before and after intravenous administration of gadolinium-based contrast.   FINDINGS:  LOWER CHEST Heart: Normal size. No pericardial effusion. Lungs/pleura: No masses, consolidations, or effusions.  ABDOMEN Liver: Liver is enlarged, measuring 20 cm in greatest craniocaudal dimension. Hepatic steatosis. Multiple T2 hyperintense and T1 iso to hypointense lesions throughout the liver as marked in series 16, decreased in size compared to outside MR 07/27/2016. Largest lesion is in segment 8 and measures 1.4 cm and demonstrates peripheral enhancement. No new lesion. Gallbladder: Normal. No stones or inflammatory changes. Biliary: No obstruction. Spleen: Spleen is mildly enlarged, measuring 13.8 cm in greatest length. Pancreas: Normal. Adrenals: Normal. Kidneys: Multiple T2 hyperintense nonenhancing lesions in the lower pole of the kidneys bilaterally, consistent with cysts. No hydronephrosis. Stomach/bowel: Right hemicolectomy. Peritoneum: Decreased size of peritoneal nodule in the left paracolic gutter measuring 6 mm (series 10, image 45). Decreased size of peritoneal nodule in the left lower quadrant along the ventral abdominal wall measuring 8 mm (series 23, image 13). No apparent new measurable lesions. Extraperitoneum: Within normal limits. Vascular: Within normal limits.  PELVIS Ureters: Within normal limits. Bladder: Within normal limits. Reproductive system: Within normal limits.  MSK: Lower midline ventral abdominal wall scar.   CT CHEST WO CONTRAST2/26/2018 [facility] Result Impression   1. Congenital variant of bilateral middle lobes. 2. Groundglass opacification with regions of bronchiectasis in the  right lower lobe adjacent to the fissure, consistent with inflammatory/infectious changes. 3. No definitive evidence of metastatic disease to the chest. 4. Probable sebaceous cyst in the subcutaneous tissues beneath the left anterior chest.  Result Narrative  CT CHEST WO CONTRAST, 10/12/2016 3:24 PM  INDICATION:stage IV colon cancer \ C18.9 Metastatic colon cancer to liver ([place]) \ C78.7 Metastatic colon cancer to liver ([place]) \ C78.6 Peritoneal carcinomatosis ([place]) \ C80.1 Peritoneal carcinomatosis ([place])  COMPARISON: None  TECHNIQUE: Multislice axial images were obtained through the chest without administration of iodinated intravenous contrast material. Multi-planar reformatted images were generated for additional analysis. Nongated technique limits cardiac detail.  All CT scans at [facility] and [facility] are performed using dose optimization techniques as appropriate  to a performed exam, including but not limited to one or more of the following: automated exposure control, adjustment of the mA and/or kV according to patient size, use of iterative reconstruction technique. In addition, [facility] is participating in the [employer] program which will further assist [place] in optimizing patient radiation exposure.   FINDINGS:   Thoracic inlet/central airways: Parenchyma is within normal limits for age. The trachea is patent. Mediastinum/hila/axilla: Scattered, nonpathologically enlarged nodes. Heart/vessels: Normal heart size. Trace pericardial effusion. Coronary artery calcifications.. The tip of the central line is in the distal SVC. Lungs/pleura: There are bilateral middle lobes. Subtle groundglass opacification surrounding the posterior aspect of the right major fissure. This is seen in association with some bronchiectasis. Upper abdomen: See concurrent MRI study. Chest wall/MSK: Multi-segmented sternum. 1.5 cm low-density soft tissue  nodule in the subcutaneous tissues beneath the left anterior chest.     MR LIVER WWO CONTRAST5/18/2018 [facility] Result Impression   1.When compared to the February 2018 MRI, there has been no interval change in the multiple T2 hyperintense lesions scattered throughout the liver compatible with metastases. When compared to the initial MRI performed December 2017, lesions have decreased in size. 2.No evidence of new hepatic or peritoneal lesions.    CT CHEST ABDOMEN PELVIS W CONTRAST (ROUTINE)01/01/2017 [facility] Result Impression   1. Most of the small hepatic lesions noted on prior MRI are not seen on today's CT, likely due to technical differences inherent to the modalities. The largest lesion noted in segment 8 on the prior examination is unchanged in size. 2. Tiny implant along the upper left paracolic gutter, quite similar to the February 2018 MRI. No ascites or new peritoneal lesions are otherwise evident. 3. Status post right hemicolectomy. 4. Hepatic steatosis. 5. Additional ancillary findings as above.   MR LIVER WWO CONTRAST 07/01/2017 Result Impression   1.Three T2 hyperintense metastatic lesions are seen in the liver, with the largest again in segment 8. These appear unchanged from prior, again without appreciable enhancement or restricted diffusion. Several of the previously noted tiny lesions are no longer visualized. No new lesions identified.  2.Treatment related changes in the posterior right hepatic lobe. 3.A peritoneal nodule adjacent to the splenic flexure is now more conspicuous compared to the prior study but appears similar to more remote imaging and again could relate to neoplastic disease, although is indeterminate.   .Ct Abdomen Pelvis W Contrast  Result Date: 08/23/2017 CLINICAL DATA:  Right upper quadrant pain for several weeks. Abdominal distention. Nausea and vomiting. Followup metastatic colon  carcinoma. EXAM: CT ABDOMEN AND PELVIS WITH CONTRAST TECHNIQUE: Multidetector CT imaging of the abdomen and pelvis was performed using the standard protocol following bolus administration of intravenous contrast. CONTRAST:  146m ISOVUE-300 IOPAMIDOL (ISOVUE-300) INJECTION 61% COMPARISON:  07/15/2017 FINDINGS: Lower Chest: No acute findings. Hepatobiliary: Diffuse hepatic steatosis again noted with geographic areas of fatty sparing in the posterior right hepatic lobe and adjacent to the gallbladder fossa. Previously seen small low-attenuation lesion in the anterior segment right hepatic lobe is no longer visualized. Sub-cm capsular nodular density along the inferior margin of the right hepatic lobe following image 74/6 remains stable. No new liver lesions identified. Radio embolization particles again seen within the right hepatic artery. Gallbladder is unremarkable. No evidence of biliary ductal dilatation. Pancreas:  No mass or inflammatory changes. Spleen: Within normal limits in size and appearance. Adrenals/Urinary Tract: No masses identified. Small bilateral renal cysts again noted. No evidence  of hydronephrosis. Stomach/Bowel: Stable postop changes from previous right colectomy. No soft tissue masses identified. No evidence of obstruction, inflammatory process or abnormal fluid collections. Vascular/Lymphatic: No pathologically enlarged lymph nodes. No abdominal aortic aneurysm. Aortic atherosclerosis. Reproductive:  No mass or other significant abnormality. Other:  None. Musculoskeletal:  No suspicious bone lesions identified. IMPRESSION: Interval resolution of previously seen small right hepatic lobe lesion. Stable tiny sub-cm nodule along the capsular surface of the right hepatic lobe. No new or progressive disease identified. Stable hepatic steatosis with areas of fatty sparing. Aortic atherosclerosis. Electronically Signed   By: Earle Gell M.D.   On: 08/23/2017 09:18   Dg Abd 2 Views  Result Date:  08/19/2017 CLINICAL DATA:  Nausea vomiting colon cancer history. Right upper quadrant pain EXAM: ABDOMEN - 2 VIEW COMPARISON:  CT 07/15/2017 FINDINGS: Nonobstructive bowel gas pattern. No air-fluid levels in the bowel. Hemidiaphragm not included on the study. If there is concern of free air, repeat upright view is suggested. Mild amount of stool in the right colon. Surgical embolization coils in the region of the duodenum. No urinary tract calculi. No acute bony abnormality. IMPRESSION: Mild amount of stool in the colon.  Normal bowel gas pattern. Hemidiaphragm not included on the upright view. Recommend repeat upright view if there is concern of pneumoperitoneum based on clinical evaluation. Electronically Signed   By: Franchot Gallo M.D.   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 KRAS mutated BRAF, NRAS mutation neg MSI Stable.  PET/CT scan done on 06/25/2016 Show multiple foci of hypermetabolic hepatic metastases (atleast 4-5) and 2 hypermetabolic peritoneal nodules along the dorsal peritoneal surface concerning for peritoneal metastases.  MRI Liver 07/27/2016 - Multiple small liver metastases throughout the right and left hepatic lobes, largest measuring 2.1 cm. 1.2 cm enhancing peritoneal nodule in left paracolic gutter, suspicious for peritoneal metastasis. Other small peritoneal nodules better visualized on recent PET-CT which showed hypermetabolic activity, also suspicious for peritoneal metastases.   MRI Abd 10/12/2016 at [facility] --shows improvement in all the liver and the 2 peritoneal lesions and no new lesions.  MRI liver and CT C/A/P on 01/01/2017 as noted above showed improved/stable disease.   MRI Liver 07/01/2017 at [facility] -  1.Three T2 hyperintense metastatic  lesions are seen in the liver, with the largest again in segment 8. These appear unchanged from prior, again without appreciable enhancement or restricted diffusion. Several of the previously noted tiny lesions are no longer visualized. No new lesions identified.  2.Treatment related changes in the posterior right hepatic lobe. 3.A peritoneal nodule adjacent to the splenic flexure is now more conspicuous compared to the prior study but appears similar to more remote imaging and again could relate to neoplastic disease, although is indeterminate.   2) RUQ Abdominal discomfort -- nearl resolved AXR and CT abd/pelvis showed no overt new pathology.  3) Mild thrombocytopenia PLT improved from 84k to 101k (already have held 5FU bolus dose).  -Platelets at 137k as of 08/26/2017. Platelets have continued to remain stable.   PLAN  -I discussed the CT abd/pelvis results and re-evaluate his rt flank/RUQ abd discomfort that has now nearly resolved. -labs stable. 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  Iron def resolved. Lab Results  Component Value Date   IRON 302 (H) 09/23/2016   TIBC 481 (H) 09/23/2016   IRONPCTSAT 63 (H) 09/23/2016   (Iron and TIBC)  Lab Results  Component Value Date   FERRITIN 108 12/31/2016   Plan  -now off PO iron   #5 Cervalgia due to DDD -tramadol prn  #6  Patient Active Problem List   Diagnosis Date Noted  . Counseling regarding advanced care planning and goals of care 05/27/2017  . 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 Genetic testing 10/30/2016  . Port catheter in place 09/25/2016  . Family history of colon cancer 07/24/2016  . Family history of prostate cancer 07/24/2016  . Metastatic colon cancer to liver ([place]) 07/01/2016  . Right Colon cancer metastasized to liver ([place]) 06/17/2016  . Chronic GI bleeding  05/29/2016  . Diabetes mellitus type 2, diet-controlled ([place]) 05/22/2016  . OSA (obstructive sleep apnea) 05/22/2016  . Hyperlipidemia 05/22/2016  . Anemia due to blood loss, chronic   . Antineoplastic chemotherapy induced pancytopenia (CODE) ([place]) 05/21/2016  Plan:  -Continue follow-up with primary care physician [name], MD for optimization of diabetes management. Might need basal insulin if his blood sugars are elevated with chemotherapy/ steroids. -Continue use of CPAP for sleep apnea.  -continue maintenance 5FU/LV q2weeks - schedule next 6 cycles -RTC with Dr Irene Limbo in 4 week -labs q2weeks   All of the patients questions were answered with apparent satisfaction. The patient knows to call the clinic with any problems, questions or concerns.  I spent 20 minutes counseling the patient face to face. The total time spent in the appointment was 20 minutes and more than 50% was on counseling and direct patient cares.    Sullivan Lone MD [place] AAHIVMS [facility] Hematology/Oncology Physician [place]  (Office):       [phone] (Work cell):  [phone] (Fax):           [phone]  This document serves as a record of services personally performed by Sullivan Lone, MD. It was created on his behalf by Margit Banda, a trained medical scribe. The creation of this record is based on the scribe's personal observations and the provider's statements to them.   .I have reviewed the above documentation for accuracy and completeness, and I agree with the above. Brunetta Genera MD MS

## 2017-08-26 NOTE — Patient Instructions (Signed)
[facility] Discharge Instructions for Patients Receiving Chemotherapy  Today you received the following chemotherapy agents: Leucovorin and Adrucil   To help prevent nausea and vomiting after your treatment, we encourage you to take your nausea medication as directed.    If you develop nausea and vomiting that is not controlled by your nausea medication, call the clinic.   BELOW ARE SYMPTOMS THAT SHOULD BE REPORTED IMMEDIATELY:  *FEVER GREATER THAN 100.5 F  *CHILLS WITH OR WITHOUT FEVER  NAUSEA AND VOMITING THAT IS NOT CONTROLLED WITH YOUR NAUSEA MEDICATION  *UNUSUAL SHORTNESS OF BREATH  *UNUSUAL BRUISING OR BLEEDING  TENDERNESS IN MOUTH AND THROAT WITH OR WITHOUT PRESENCE OF ULCERS  *URINARY PROBLEMS  *BOWEL PROBLEMS  UNUSUAL RASH Items with * indicate a potential emergency and should be followed up as soon as possible.  Feel free to call the clinic should you have any questions or concerns. The clinic phone number is [phone].  Please show the CHEMO ALERT CARD at check-in to the Emergency Department and triage nurse.   

## 2017-08-27 ENCOUNTER — Other Ambulatory Visit: Payer: Self-pay | Admitting: Hematology

## 2017-08-28 DIAGNOSIS — C182 Malignant neoplasm of ascending colon: Secondary | ICD-10-CM | POA: Diagnosis not present

## 2017-08-28 MED ORDER — SODIUM CHLORIDE 0.9% FLUSH
10.0000 mL | INTRAVENOUS | Status: DC | PRN
  Administered 2017-08-28: 10 mL
  Filled 2017-08-28: qty 10

## 2017-08-28 MED ORDER — HEPARIN SOD (PORK) LOCK FLUSH 100 UNIT/ML IV SOLN
500.0000 [IU] | Freq: Once | INTRAVENOUS | Status: AC | PRN
  Administered 2017-08-28: 500 [IU]
  Filled 2017-08-28: qty 5

## 2017-08-30 ENCOUNTER — Telehealth: Payer: Self-pay | Admitting: Hematology

## 2017-08-30 NOTE — Telephone Encounter (Signed)
No additional appts added per 1/10 los - all appts scheduled per tx plan.

## 2017-09-02 ENCOUNTER — Other Ambulatory Visit: Payer: 59

## 2017-09-02 ENCOUNTER — Ambulatory Visit: Payer: 59

## 2017-09-07 NOTE — Progress Notes (Signed)
[name]    HEMATOLOGY/ONCOLOGY CLINIC NOTE  Date of Service: 09/08/17  Patient Care Team: [name], MD as PCP - General (Family Medicine) [name] M.D. 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 KRAS mutated BRAF, NRAS mutation neg MSI Stable.  PET/CT scan done on 06/25/2016 Show multiple foci of hypermetabolic hepatic metastases (atleast 4-5) and 2 hypermetabolic peritoneal nodules along the dorsal peritoneal surface concerning for peritoneal metastases.  MRI Liver 07/27/2016 - Multiple small liver metastases throughout the right and left hepatic lobes, largest measuring 2.1 cm. 1.2 cm enhancing peritoneal nodule in left paracolic gutter, suspicious for peritoneal metastasis. Other small peritoneal nodules better visualized on recent PET-CT which showed hypermetabolic activity, also suspicious for peritoneal metastases.   CT CHEST WO CONTRAST 10/12/2016  [facility] Result Impression   1. Congenital variant of bilateral middle lobes. 2. Groundglass opacification with regions of bronchiectasis in the right lower lobe adjacent to the fissure, consistent with inflammatory/infectious changes. 3. No definitive evidence of metastatic disease to the chest. 4. Probable sebaceous cyst in the subcutaneous tissues beneath the left anterior chest.   MR ABDOMEN AND PELVIS HWEXHBZJ FOLLOW UP2/26/2018 [facility] Result Impression    Decreased size of multiple hepatic lesions and two peritoneal nodules compared to outside MRI 07/27/2016. No new lesions identified in  the abdomen or pelvis.     PREVIOUS TREATMENT  FOLFOX x 10 cycles. 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 (without 5FU bolus - due to thrombocytopenia). S/p Y-90 treatment to 1 hepatic lobe.  Plan for bilobar hepatic Y90 radio-embolization.  INTERVAL HISTORY   Scott Gallagher is here for follow-up for his metastatic colon cancer and for his next cycle of 5FU/leucovorin for maintenance therapy. He was seen today  accompanied by his wife. His last visit was on 08/26/17. The pt reports that he is doing well overall and has enjoyed a new lifestyle of going to the gym.   Lab results today (09/08/17) of CBC, CMP, and Reticulocytes is as follows: all values are WNL except for Retic Ct Pct at 2.0, glucose at 230 (not fasting, after a sweet tea),and  platelets at 115 . Pt reports blood glucose levels around 180 normally, and a trend downwards since visiting the gym.  On review of systems, pt reports good balance, eating and sleeping well, unchanging neuropathy, stable sugar levels, intermittent neck pain, and mild intermittent abdominal aches, and denies any problems with his port, mouth sores, leg swelling.  No other new ctx related toxicities. Abd discomfort much improved.  MEDICAL HISTORY:  Past Medical History:  Diagnosis Date  . Anemia   . Arthritis   . Cancer ([place])   . Diabetes mellitus without complication (HCC)    diet controlled  . History of blood transfusion   . Hyperlipidemia   . Sleep apnea    cpap  . Wears glasses     SURGICAL HISTORY: Past Surgical History:  Procedure Laterality Date  . COLON SURGERY    . IR GENERIC HISTORICAL  09/24/2016   IR CV LINE INJECTION 09/24/2016 [facility]  . IR GENERIC HISTORICAL  09/24/2016   IR US GUIDE VASC ACCESS RIGHT 09/24/2016 [facility]  . IR GENERIC HISTORICAL  09/24/2016   IR FLUORO GUIDE  CV LINE RIGHT 09/24/2016 [facility]  . IR GENERIC HISTORICAL  09/30/2016   IR FLUORO GUIDE PORT INSERTION RIGHT 09/30/2016 Markus Daft, MD [facility]  . IR GENERIC HISTORICAL  09/30/2016   IR US GUIDE VASC ACCESS RIGHT 09/30/2016 Markus Daft, MD [facility]  . IR GENERIC HISTORICAL  09/30/2016   IR REMOVAL TUN ACCESS W/ PORT W/O FL MOD SED 09/30/2016 Markus Daft, MD [facility]  . LAPAROSCOPIC RIGHT HEMI COLECTOMY Right 06/17/2016   Procedure: LAPAROSCOPIC ASSISTED  RIGHT HEMI COLECTOMY;  Surgeon: [name], MD;  Location: [facility];  Service: General;  Laterality: Right;  . PORTACATH PLACEMENT Left 07/13/2016   Procedure: INSERTION PORT-A-CATH left subclavian;  Surgeon: [name], MD;  Location: [facility];  Service: General;  Laterality: Left;  . SHOULDER ACROMIOPLASTY Right 12/20/2014   Procedure: SHOULDER ACROMIOPLASTY;  Surgeon: Melrose Nakayama, MD;  Location: [place];  Service: Orthopedics;  Laterality: Right;  . SHOULDER ARTHROSCOPY Right 12/20/2014   Procedure: RIGHT ARTHROSCOPY SHOULDER WITH DEBRIDEMENT;  Surgeon: Melrose Nakayama, MD;  Location: [place];  Service: Orthopedics;  Laterality: Right;  . TESTICLE SURGERY     as teen  . TONSILLECTOMY    . WISDOM TOOTH EXTRACTION      SOCIAL HISTORY: Social History   Socioeconomic History  . Marital status: Married    Spouse name: Not on file  . Number of children: Not on file  . Years of education: Not on file  . Highest education level: Not on file  Social Needs  . Financial resource strain: Not on file  . Food insecurity - worry: Not on file  . Food insecurity - inability: Not on file  . Transportation needs - medical: Not on file  . Transportation needs - non-medical: Not on file  Occupational History  . Occupation: cemetery maintenance  Tobacco Use  . Smoking status: Former Smoker    Packs/day: 1.50    Years: 29.00    Pack years: 43.50    Types: Cigarettes    Last attempt to quit:  12/16/2012    Years since quitting: 4.7  . Smokeless tobacco: Never Used  . Tobacco comment: 1-2 ppd from age [age]y to 2014  Substance and Sexual Activity  . Alcohol use: Yes    Alcohol/week: 1.8 oz    Types: 3 Shots of liquor per week    Comment:  some days  . Drug use: No  . Sexual activity: Not on file  Other Topics Concern  . Not on file  Social History Narrative  . Not on file    FAMILY HISTORY: Family History  Problem Relation Age of Onset  . Diabetes Other   . Hyperlipidemia Other   . Hypertension Other   . Breast cancer Maternal Aunt 70  . Prostate cancer Maternal Uncle 75  . Lung cancer Paternal Uncle 73  . Stroke Maternal Grandfather 72  . Cancer Paternal Grandmother 28       dx cancer of pancreas and colon, unknown if separate primaries  . Heart Problems Paternal Grandfather        d. 72  . Prostate cancer Maternal Uncle 25  . Breast cancer Maternal Aunt 75  . Breast cancer Maternal Aunt 68  . Cervical  cancer Maternal Aunt 68  . Pancreatic cancer Maternal Aunt 54       d. 58y; heavy smoker  . Colon cancer Paternal Uncle 65       s/p partial colectomy; dx. second colon cancer at age 70-64    ALLERGIES:  is allergic to penicillins.  MEDICATIONS:  Current Outpatient Medications  Medication Sig Dispense Refill  . b complex vitamins tablet Take 1 tablet by mouth daily.    [name] dexamethasone (DECADRON) 4 MG tablet Take 2 tablets (8 mg total) by mouth daily. Start the day after chemotherapy for 2 days. Take with food. 30 tablet 0  . DULoxetine (CYMBALTA) 60 MG capsule TAKE 1 CAPSULE BY MOUTH EVERY DAY 30 capsule 1  . lidocaine-prilocaine (EMLA) cream Apply to affected area once 30 g 3  . LORazepam (ATIVAN) 0.5 MG tablet Take 1 tablet (0.5 mg total) by mouth every 8 (eight) hours. For anxiety or sleep 30 tablet 0  . Multiple Vitamin (MULTIVITAMIN) tablet Take 1 tablet by mouth daily.    . ondansetron (ZOFRAN) 8 MG tablet Take 1 tablet (8 mg total) by mouth 2 (two)  times daily as needed for refractory nausea / vomiting. Start on day 3 after chemotherapy. 30 tablet 1  . Probiotic Product (PROBIOTIC PO) Take 1 tablet by mouth daily.    . prochlorperazine (COMPAZINE) 10 MG tablet Take 1 tablet (10 mg total) by mouth every 6 (six) hours as needed (Nausea or vomiting). 30 tablet 1  . pyridOXINE (VITAMIN B-6) 100 MG tablet Take 200 mg by mouth daily.    . traMADol (ULTRAM) 50 MG tablet Take 1 tablet (50 mg total) by mouth every 6 (six) hours as needed for moderate pain or severe pain. 60 tablet 0   No current facility-administered medications for this visit.    Facility-Administered Medications Ordered in Other Visits  Medication Dose Route Frequency Provider Last Rate Last Dose  . dextrose 5 % solution   Intravenous Once Brunetta Genera, MD      . fluorouracil (ADRUCIL) 5,800 mg in sodium chloride 0.9 % 134 mL chemo infusion  2,400 mg/m2 (Treatment Plan Recorded) Intravenous 1 day or 1 dose Brunetta Genera, MD   5,800 mg at 09/08/17 1218  . sodium chloride flush (NS) 0.9 % injection 10 mL  10 mL Intracatheter PRN Brunetta Genera, MD   10 mL at 08/28/17 0930    REVIEW OF SYSTEMS:    10 Point review of Systems was done is negative except as noted above.  PHYSICAL EXAMINATION:  ECOG PERFORMANCE STATUS: 1 - Symptomatic but completely ambulatory VS reviewed in EPIC  GENERAL:alert, in no acute distress and comfortable SKIN: skin color, texture, turgor are normal, no rashes or significant lesions EYES: normal, conjunctiva are pink and non-injected, sclera clear OROPHARYNX:no exudate, no erythema and lips, buccal mucosa, and tongue normal  NECK: supple, no JVD, thyroid normal size, non-tender, without nodularity LYMPH:  no palpable lymphadenopathy in the cervical, axillary or inguinal LUNGS: clear to auscultation with normal respiratory effort HEART: regular rate & rhythm,  no murmurs and no lower extremity edema ABDOMEN: abdomen soft, bowel  sounds normoactive , mild TTP over RUQ of abd and rt flank   Musculoskeletal: no cyanosis of digits and no clubbing  PSYCH: alert & oriented x 3 with fluent speech NEURO: no focal motor/sensory deficits   LABORATORY DATA:  I have reviewed the data as listed  .[name] CBC Latest Ref Rng & Units 09/08/2017 08/26/2017 08/19/2017  WBC 4.0 - 10.3 K/uL 6.0 4.3 5.7  Hemoglobin 13.0 - 17.1 g/dL - 14.4 14.2  Hematocrit 38.4 - 49.9 % 41.4 42.3 41.2  Platelets 140 - 400 K/uL 115(L) 137(L) 97(L)   . CMP Latest Ref Rng & Units 09/08/2017 08/26/2017 08/19/2017  Glucose 70 - 140 mg/dL 230(H) 246(H) 294(H)  BUN 7 - 26 mg/dL 10 9 9.0  Creatinine 0.70 - 1.30 mg/dL 0.95 0.91 1.0  Sodium 136 - 145 mmol/L 138 137 138  Potassium 3.5 - 5.1 mmol/L 4.1 4.2 4.0  Chloride 98 - 109 mmol/L 104 104 -  CO2 22 - 29 mmol/L _0 Calcium 8.4 - 10.4 mg/dL 9.2 9.3 9.4  Total Protein 6.4 - 8.3 g/dL 6.7 6.9 6.7  Total Bilirubin 0.2 - 1.2 mg/dL 0.9 1.2 1.22(H)  Alkaline Phos 40 - 150 U/L 132 131 134  AST 5 - 34 U/L 32 45(H) 33  ALT 0 - 55 U/L 45 74(H) 53       Microscopic Comment 2. COLON AND RECTUM (INCLUDING TRANS-ANAL RESECTION): Specimen: Terminal ileum, right colon and appendix Procedure: Segmental resection Tumor site: Proximal ascending colon Specimen integrity: Intact Macroscopic intactness of mesorectum: Not applicable: x Complete: NA Near complete: NA Incomplete: NA Cannot be determined (specify): NA Macroscopic tumor perforation: The mass invades through muscularis propria into pericolonic soft tissue Invasive tumor: Maximum size: 5.5 cm Histologic type(s): Adenocarcinoma Histologic grade and differentiation: G3 G1: well differentiated/low grade 1 of 4 Supplemental copy SUPPLEMENTAL for Babel, Arafat ([id number]) Microscopic Comment(continued) G2: moderately differentiated/low grade G3: poorly differentiated/high grade G4: undifferentiated/high grade Type of polyp in which invasive carcinoma  arose: Tubular adenoma Microscopic extension of invasive tumor: The mass invade through the muscularis propria into pericolonic soft tissue Lymph-Vascular invasion: Identified Peri-neural invasion: Identified Tumor deposit(s) (discontinuous extramural extension): Present Resection margins: Proximal margin: Negative Distal margin: Negative Circumferential (radial) (posterior ascending, posterior descending; lateral and posterior mid-rectum; and entire lower 1/3 rectum):Negative Mesenteric margin (sigmoid and transverse): NA Distance closest margin (if all above margins negative): 3.7 cm from the non peritonealized pericolic soft tissue margin Trans-anal resection margins only: Deep margin: NA Mucosal Margin: NA Distance closest mucosal margin (if negative): NA Treatment effect (neo-adjuvant therapy): NA Additional polyp(s): Negative Non-neoplastic findings: Unremarkable Lymph nodes: number examined 18; number positive: 5 Pathologic Staging: pT3, N2a, M1a Ancillary studies: MSI ordered      RADIOGRAPHIC STUDIES: I have personally reviewed the radiological images as listed and agreed with the findings in the report. Ct Abdomen Pelvis W Contrast  Result Date: 08/23/2017 CLINICAL DATA:  Right upper quadrant pain for several weeks. Abdominal distention. Nausea and vomiting. Followup metastatic colon carcinoma. EXAM: CT ABDOMEN AND PELVIS WITH CONTRAST TECHNIQUE: Multidetector CT imaging of the abdomen and pelvis was performed using the standard protocol following bolus administration of intravenous contrast. CONTRAST:  173m ISOVUE-300 IOPAMIDOL (ISOVUE-300) INJECTION 61% COMPARISON:  07/15/2017 FINDINGS: Lower Chest: No acute findings. Hepatobiliary: Diffuse hepatic steatosis again noted with geographic areas of fatty sparing in the posterior right hepatic lobe and adjacent to the gallbladder fossa. Previously seen small low-attenuation lesion in the anterior segment right hepatic lobe is  no longer visualized. Sub-cm capsular nodular density along the inferior margin of the right hepatic lobe following image 74/6 remains stable. No new liver lesions identified. Radio embolization particles again seen within the right hepatic artery. Gallbladder is unremarkable. No evidence of biliary ductal dilatation. Pancreas:  No mass or inflammatory changes. Spleen: Within normal limits in size and appearance. Adrenals/Urinary  Tract: No masses identified. Small bilateral renal cysts again noted. No evidence of hydronephrosis. Stomach/Bowel: Stable postop changes from previous right colectomy. No soft tissue masses identified. No evidence of obstruction, inflammatory process or abnormal fluid collections. Vascular/Lymphatic: No pathologically enlarged lymph nodes. No abdominal aortic aneurysm. Aortic atherosclerosis. Reproductive:  No mass or other significant abnormality. Other:  None. Musculoskeletal:  No suspicious bone lesions identified. IMPRESSION: Interval resolution of previously seen small right hepatic lobe lesion. Stable tiny sub-cm nodule along the capsular surface of the right hepatic lobe. No new or progressive disease identified. Stable hepatic steatosis with areas of fatty sparing. Aortic atherosclerosis. Electronically Signed   By: Earle Gell M.D.   On: 08/23/2017 09:18   Dg Abd 2 Views  Result Date: 08/19/2017 CLINICAL DATA:  Nausea vomiting colon cancer history. Right upper quadrant pain EXAM: ABDOMEN - 2 VIEW COMPARISON:  CT 07/15/2017 FINDINGS: Nonobstructive bowel gas pattern. No air-fluid levels in the bowel. Hemidiaphragm not included on the study. If there is concern of free air, repeat upright view is suggested. Mild amount of stool in the right colon. Surgical embolization coils in the region of the duodenum. No urinary tract calculi. No acute bony abnormality. IMPRESSION: Mild amount of stool in the colon.  Normal bowel gas pattern. Hemidiaphragm not included on the upright view.  Recommend repeat upright view if there is concern of pneumoperitoneum based on clinical evaluation. Electronically Signed   By: Franchot Gallo M.D.   On: 08/19/2017 12:49   2018 February MR ABDOMEN AND PELVIS WWOTUMOR FOLLOW UP2/26/2018 [facility] Result Impression    Decreased size of multiple hepatic lesions and two peritoneal nodules compared to outside MRI 07/27/2016. No new lesions identified in the abdomen or pelvis.  Result Narrative  MR ABDOMEN AND PELVIS WITH AND WITHOUT CONTRAST (TUMOR FOLLOW-UP PROTOCOL), 10/12/2016 5:59 PM   INDICATION: liver and peritoneal colon cancer metastases \ C18.9 Metastatic colon cancer to liver (HCC) \ C78.7 Metastatic colon cancer to liver ([place]) \ C78.6 Peritoneal carcinomatosis ([place]) \ C80.1 Peritoneal carcinomatosis ([place])   ADDITIONAL HISTORY: History of stage IV colorectal cancer status post laparoscopic right hemicolectomy on 06/17/2016. At time of surgery, tumor nodule in the right lobe of the liver was positive on biopsy for adenocarcinoma. Patient currently undergoing chemotherapy.  COMPARISON: Outside studies including CT chest abdomen pelvis 05/23/2016, PET CT 07/15/2016, and MR abdomen 07/27/2016   TECHNIQUE: Multiplanar, multisequence MR images of the abdomen and pelvis were obtained before and after intravenous administration of gadolinium-based contrast.   FINDINGS:  LOWER CHEST Heart: Normal size. No pericardial effusion. Lungs/pleura: No masses, consolidations, or effusions.  ABDOMEN Liver: Liver is enlarged, measuring 20 cm in greatest craniocaudal dimension. Hepatic steatosis. Multiple T2 hyperintense and T1 iso to hypointense lesions throughout the liver as marked in series 16, decreased in size compared to outside MR 07/27/2016. Largest lesion is in segment 8 and measures 1.4 cm and demonstrates peripheral enhancement. No new lesion. Gallbladder: Normal. No stones or inflammatory changes. Biliary: No  obstruction. Spleen: Spleen is mildly enlarged, measuring 13.8 cm in greatest length. Pancreas: Normal. Adrenals: Normal. Kidneys: Multiple T2 hyperintense nonenhancing lesions in the lower pole of the kidneys bilaterally, consistent with cysts. No hydronephrosis. Stomach/bowel: Right hemicolectomy. Peritoneum: Decreased size of peritoneal nodule in the left paracolic gutter measuring 6 mm (series 10, image 45). Decreased size of peritoneal nodule in the left lower quadrant along the ventral abdominal wall measuring 8 mm (series 23, image 13). No apparent new measurable lesions.  Extraperitoneum: Within normal limits. Vascular: Within normal limits.  PELVIS Ureters: Within normal limits. Bladder: Within normal limits. Reproductive system: Within normal limits.  MSK: Lower midline ventral abdominal wall scar.   CT CHEST WO CONTRAST2/26/2018 [facility] Result Impression   1. Congenital variant of bilateral middle lobes. 2. Groundglass opacification with regions of bronchiectasis in the right lower lobe adjacent to the fissure, consistent with inflammatory/infectious changes. 3. No definitive evidence of metastatic disease to the chest. 4. Probable sebaceous cyst in the subcutaneous tissues beneath the left anterior chest.  Result Narrative  CT CHEST WO CONTRAST, 10/12/2016 3:24 PM  INDICATION:stage IV colon cancer \ C18.9 Metastatic colon cancer to liver ([place]) \ C78.7 Metastatic colon cancer to liver ([place]) \ C78.6 Peritoneal carcinomatosis ([place]) \ C80.1 Peritoneal carcinomatosis ([place])  COMPARISON: None  TECHNIQUE: Multislice axial images were obtained through the chest without administration of iodinated intravenous contrast material. Multi-planar reformatted images were generated for additional analysis. Nongated technique limits cardiac detail.  All CT scans at [facility] and [place] are performed using dose  optimization techniques as appropriate to a performed exam, including but not limited to one or more of the following: automated exposure control, adjustment of the mA and/or kV according to patient size, use of iterative reconstruction technique. In addition, [facility] is participating in the [place] program which will further assist [place] in optimizing patient radiation exposure.   FINDINGS:   Thoracic inlet/central airways: Parenchyma is within normal limits for age. The trachea is patent. Mediastinum/hila/axilla: Scattered, nonpathologically enlarged nodes. Heart/vessels: Normal heart size. Trace pericardial effusion. Coronary artery calcifications.. The tip of the central line is in the distal SVC. Lungs/pleura: There are bilateral middle lobes. Subtle groundglass opacification surrounding the posterior aspect of the right major fissure. This is seen in association with some bronchiectasis. Upper abdomen: See concurrent MRI study. Chest wall/MSK: Multi-segmented sternum. 1.5 cm low-density soft tissue nodule in the subcutaneous tissues beneath the left anterior chest.     MR LIVER WWO CONTRAST5/18/2018 [facility] Result Impression   1.When compared to the February 2018 MRI, there has been no interval change in the multiple T2 hyperintense lesions scattered throughout the liver compatible with metastases. When compared to the initial MRI performed December 2017, lesions have decreased in size. 2.No evidence of new hepatic or peritoneal lesions.    CT CHEST ABDOMEN PELVIS W CONTRAST (ROUTINE)01/01/2017 [facility] Result Impression   1. Most of the small hepatic lesions noted on prior MRI are not seen on today's CT, likely due to technical differences inherent to the modalities. The largest lesion noted in segment 8 on the prior examination is unchanged in size. 2. Tiny implant along the upper left paracolic gutter,  quite similar to the February 2018 MRI. No ascites or new peritoneal lesions are otherwise evident. 3. Status post right hemicolectomy. 4. Hepatic steatosis. 5. Additional ancillary findings as above.   MR LIVER WWO CONTRAST 07/01/2017 Result Impression   1.Three T2 hyperintense metastatic lesions are seen in the liver, with the largest again in segment 8. These appear unchanged from prior, again without appreciable enhancement or restricted diffusion. Several of the previously noted tiny lesions are no longer visualized. No new lesions identified.  2.Treatment related changes in the posterior right hepatic lobe. 3.A peritoneal nodule adjacent to the splenic flexure is now more conspicuous compared to the prior study but appears similar to more remote imaging and  again could relate to neoplastic disease, although is indeterminate.   .Ct Abdomen Pelvis W Contrast  Result Date: 08/23/2017 CLINICAL DATA:  Right upper quadrant pain for several weeks. Abdominal distention. Nausea and vomiting. Followup metastatic colon carcinoma. EXAM: CT ABDOMEN AND PELVIS WITH CONTRAST TECHNIQUE: Multidetector CT imaging of the abdomen and pelvis was performed using the standard protocol following bolus administration of intravenous contrast. CONTRAST:  165m ISOVUE-300 IOPAMIDOL (ISOVUE-300) INJECTION 61% COMPARISON:  07/15/2017 FINDINGS: Lower Chest: No acute findings. Hepatobiliary: Diffuse hepatic steatosis again noted with geographic areas of fatty sparing in the posterior right hepatic lobe and adjacent to the gallbladder fossa. Previously seen small low-attenuation lesion in the anterior segment right hepatic lobe is no longer visualized. Sub-cm capsular nodular density along the inferior margin of the right hepatic lobe following image 74/6 remains stable. No new liver lesions identified. Radio embolization particles again seen within the right hepatic artery. Gallbladder is unremarkable. No evidence of  biliary ductal dilatation. Pancreas:  No mass or inflammatory changes. Spleen: Within normal limits in size and appearance. Adrenals/Urinary Tract: No masses identified. Small bilateral renal cysts again noted. No evidence of hydronephrosis. Stomach/Bowel: Stable postop changes from previous right colectomy. No soft tissue masses identified. No evidence of obstruction, inflammatory process or abnormal fluid collections. Vascular/Lymphatic: No pathologically enlarged lymph nodes. No abdominal aortic aneurysm. Aortic atherosclerosis. Reproductive:  No mass or other significant abnormality. Other:  None. Musculoskeletal:  No suspicious bone lesions identified. IMPRESSION: Interval resolution of previously seen small right hepatic lobe lesion. Stable tiny sub-cm nodule along the capsular surface of the right hepatic lobe. No new or progressive disease identified. Stable hepatic steatosis with areas of fatty sparing. Aortic atherosclerosis. Electronically Signed   By: JEarle GellM.D.   On: 08/23/2017 09:18   Dg Abd 2 Views  Result Date: 08/19/2017 CLINICAL DATA:  Nausea vomiting colon cancer history. Right upper quadrant pain EXAM: ABDOMEN - 2 VIEW COMPARISON:  CT 07/15/2017 FINDINGS: Nonobstructive bowel gas pattern. No air-fluid levels in the bowel. Hemidiaphragm not included on the study. If there is concern of free air, repeat upright view is suggested. Mild amount of stool in the right colon. Surgical embolization coils in the region of the duodenum. No urinary tract calculi. No acute bony abnormality. IMPRESSION: Mild amount of stool in the colon.  Normal bowel gas pattern. Hemidiaphragm not included on the upright view. Recommend repeat upright view if there is concern of pneumoperitoneum based on clinical evaluation. Electronically Signed   By: CFranchot GalloM.D.   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 KRAS mutated BRAF, NRAS mutation neg MSI Stable.  PET/CT scan done on 06/25/2016 Show multiple foci of hypermetabolic hepatic metastases (atleast 4-5) and 2 hypermetabolic peritoneal nodules along the dorsal peritoneal surface concerning for peritoneal metastases.  MRI Liver 07/27/2016 - Multiple small liver metastases throughout the right and left hepatic lobes, largest measuring 2.1 cm. 1.2 cm enhancing peritoneal nodule in left paracolic gutter, suspicious for peritoneal metastasis. Other small peritoneal nodules better visualized on recent PET-CT which showed hypermetabolic activity, also suspicious for peritoneal metastases.   [facility]shows improvement in all the liver and the 2 peritoneal lesions and no new lesions.  MRI liver and CT C/A/P on 01/01/2017 as noted above  showed improved/stable disease.   MRI Liver 07/01/2017 at [facility] -  1.Three T2 hyperintense metastatic lesions are seen in the liver, with the largest again in segment 8. These appear unchanged from prior, again without appreciable enhancement or restricted diffusion. Several of the previously noted tiny lesions are no longer visualized. No new lesions identified.  2.Treatment related changes in the posterior right hepatic lobe. 3.A peritoneal nodule adjacent to the splenic flexure is now more conspicuous compared to the prior study but appears similar to more remote imaging and again could relate to neoplastic disease, although is indeterminate.  CT Abd/pelvis 08/23/2017: Interval resolution of previously seen small right hepatic lobe lesion. Stable tiny sub-cm nodule along the capsular surface of the right hepatic lobe. No new or progressive disease identified. Stable hepatic steatosis with areas of fatty sparing.  2) RUQ Abdominal discomfort -- nearly  resolved AXR and CT abd/pelvis showed no overt new pathology.  3) Mild thrombocytopenia PLT improved from 84k to 101k (already have held 5FU bolus dose).  -Platelets at 115k as of 09/08/2017. Platelets have continued to remain stable.   PLAN  -Discussed pt labwork today -no new prohibitive toxicity from current treatment. -Advised pt to continue f/u with Dr. Pecola Lawless -MRI impending for next visit with Dr. Pecola Lawless in February 2019 -RTC in 1 month with labs  -labs stable. -will continue maintenance 5FU/LV -continue f/u with Dr Pecola Lawless as per plan  4) Iron deficiency anemia due to GI bleeding from the tumor and some blood loss with surgery.  Iron def resolved. Lab Results  Component Value Date   IRON 302 (H) 09/23/2016   TIBC 481 (H) 09/23/2016   IRONPCTSAT 63 (H) 09/23/2016   (Iron and TIBC)  Lab Results  Component Value Date   FERRITIN 108 12/31/2016   Plan  -now off PO iron   #5 Cervalgia due to DDD -tramadol prn  #6  Patient Active Problem List   Diagnosis Date Noted  . Counseling regarding advanced care planning and goals of care 05/27/2017  . Chemotherapy-induced neuropathy ([place]) 11/18/2016  . Genetic testing 10/30/2016  . Port catheter in place 09/25/2016  . Family history of colon cancer 07/24/2016  . Family history of prostate cancer 07/24/2016  . Metastatic colon cancer to liver ([place]) 07/01/2016  . Right Colon cancer metastasized to liver ([place]) 06/17/2016  . Chronic GI bleeding 05/29/2016  . Diabetes mellitus type 2, diet-controlled ([place]) 05/22/2016  . OSA (obstructive sleep apnea) 05/22/2016  . Hyperlipidemia 05/22/2016  . Anemia due to blood loss, chronic   . Antineoplastic chemotherapy induced pancytopenia (CODE) ([place]) 05/21/2016   Plan:  -Continue follow-up with primary care physician [name], MD for optimization of diabetes management. Might need basal insulin if his blood sugars are elevated with chemotherapy/ steroids. -Continue use of CPAP for  sleep apnea.  -Continue maintenance 5FU/LV chemotherapy q2weeks with labs -plz schedule next 4 doses -RTC with Dr Irene Limbo in 4 weeks   All of the patients questions were answered with apparent satisfaction. The patient knows to call the clinic with any problems, questions or concerns.  I spent 20 minutes counseling the patient face to face. The total time spent in the appointment was 25 minutes and more than 50% was on counseling and direct patient cares.    Sullivan Lone MD [place] AAHIVMS [facility] Hematology/Oncology Physician [facility]  (Office):       [phone] (Work cell):  [phone] (Fax):  [phone]  This document serves as a record of services personally performed by Sullivan Lone, MD. It was created on his behalf by Baldwin Jamaica, a trained medical scribe. The creation of this record is based on the scribe's personal observations and the provider's statements to them.   .I have reviewed the above documentation for accuracy and completeness, and I agree with the above. Brunetta Genera MD MS

## 2017-09-08 ENCOUNTER — Encounter: Payer: Self-pay | Admitting: Hematology

## 2017-09-08 ENCOUNTER — Inpatient Hospital Stay: Payer: 59

## 2017-09-08 ENCOUNTER — Inpatient Hospital Stay (HOSPITAL_BASED_OUTPATIENT_CLINIC_OR_DEPARTMENT_OTHER): Payer: 59 | Admitting: Hematology

## 2017-09-08 ENCOUNTER — Telehealth: Payer: Self-pay | Admitting: Hematology

## 2017-09-08 VITALS — BP 129/78 | HR 84 | Temp 98.5°F | Resp 18 | Ht 74.0 in | Wt 254.4 lb

## 2017-09-08 DIAGNOSIS — C787 Secondary malignant neoplasm of liver and intrahepatic bile duct: Secondary | ICD-10-CM | POA: Diagnosis not present

## 2017-09-08 DIAGNOSIS — E119 Type 2 diabetes mellitus without complications: Secondary | ICD-10-CM

## 2017-09-08 DIAGNOSIS — Z87891 Personal history of nicotine dependence: Secondary | ICD-10-CM

## 2017-09-08 DIAGNOSIS — Z880 Allergy status to penicillin: Secondary | ICD-10-CM

## 2017-09-08 DIAGNOSIS — R1011 Right upper quadrant pain: Secondary | ICD-10-CM | POA: Diagnosis not present

## 2017-09-08 DIAGNOSIS — K760 Fatty (change of) liver, not elsewhere classified: Secondary | ICD-10-CM

## 2017-09-08 DIAGNOSIS — C189 Malignant neoplasm of colon, unspecified: Secondary | ICD-10-CM

## 2017-09-08 DIAGNOSIS — E785 Hyperlipidemia, unspecified: Secondary | ICD-10-CM

## 2017-09-08 DIAGNOSIS — C786 Secondary malignant neoplasm of retroperitoneum and peritoneum: Secondary | ICD-10-CM | POA: Diagnosis not present

## 2017-09-08 DIAGNOSIS — Z8049 Family history of malignant neoplasm of other genital organs: Secondary | ICD-10-CM

## 2017-09-08 DIAGNOSIS — Z9989 Dependence on other enabling machines and devices: Secondary | ICD-10-CM

## 2017-09-08 DIAGNOSIS — Z7189 Other specified counseling: Secondary | ICD-10-CM

## 2017-09-08 DIAGNOSIS — G4730 Sleep apnea, unspecified: Secondary | ICD-10-CM | POA: Diagnosis not present

## 2017-09-08 DIAGNOSIS — D696 Thrombocytopenia, unspecified: Secondary | ICD-10-CM | POA: Diagnosis not present

## 2017-09-08 DIAGNOSIS — M5030 Other cervical disc degeneration, unspecified cervical region: Secondary | ICD-10-CM | POA: Diagnosis not present

## 2017-09-08 DIAGNOSIS — Z803 Family history of malignant neoplasm of breast: Secondary | ICD-10-CM

## 2017-09-08 DIAGNOSIS — Z800 Family history of malignant neoplasm of digestive organs: Secondary | ICD-10-CM

## 2017-09-08 DIAGNOSIS — C182 Malignant neoplasm of ascending colon: Secondary | ICD-10-CM

## 2017-09-08 DIAGNOSIS — Z801 Family history of malignant neoplasm of trachea, bronchus and lung: Secondary | ICD-10-CM

## 2017-09-08 DIAGNOSIS — Z79899 Other long term (current) drug therapy: Secondary | ICD-10-CM | POA: Diagnosis not present

## 2017-09-08 LAB — CBC WITH DIFFERENTIAL (CANCER CENTER ONLY)
BASOS ABS: 0 10*3/uL (ref 0.0–0.1)
Basophils Relative: 1 %
EOS ABS: 0.1 10*3/uL (ref 0.0–0.5)
EOS PCT: 2 %
HCT: 41.4 % (ref 38.4–49.9)
Hemoglobin: 13.8 g/dL (ref 13.0–17.1)
Lymphocytes Relative: 19 %
Lymphs Abs: 1.1 10*3/uL (ref 0.9–3.3)
MCH: 31.4 pg (ref 27.2–33.4)
MCHC: 33.3 g/dL (ref 32.0–36.0)
MCV: 94.3 fL (ref 79.3–98.0)
Monocytes Absolute: 0.6 10*3/uL (ref 0.1–0.9)
Monocytes Relative: 10 %
Neutro Abs: 4.1 10*3/uL (ref 1.5–6.5)
Neutrophils Relative %: 68 %
PLATELETS: 115 10*3/uL — AB (ref 140–400)
RBC: 4.39 MIL/uL (ref 4.20–5.82)
RDW: 14.4 % (ref 11.0–15.6)
WBC: 6 10*3/uL (ref 4.0–10.3)

## 2017-09-08 LAB — COMPREHENSIVE METABOLIC PANEL
ALK PHOS: 132 U/L (ref 40–150)
ALT: 45 U/L (ref 0–55)
AST: 32 U/L (ref 5–34)
Albumin: 3.9 g/dL (ref 3.5–5.0)
Anion gap: 10 (ref 3–11)
BILIRUBIN TOTAL: 0.9 mg/dL (ref 0.2–1.2)
BUN: 10 mg/dL (ref 7–26)
CO2: 24 mmol/L (ref 22–29)
CREATININE: 0.95 mg/dL (ref 0.70–1.30)
Calcium: 9.2 mg/dL (ref 8.4–10.4)
Chloride: 104 mmol/L (ref 98–109)
GFR calc Af Amer: >60 mL/min (ref >60)
GLUCOSE: 230 mg/dL — AB (ref 70–140)
Potassium: 4.1 mmol/L (ref 3.5–5.1)
Sodium: 138 mmol/L (ref 136–145)
TOTAL PROTEIN: 6.7 g/dL (ref 6.4–8.3)

## 2017-09-08 LAB — RETICULOCYTES
RBC.: 4.39 MIL/uL (ref 4.20–5.82)
RETIC CT PCT: 2 % — AB (ref 0.8–1.8)
Retic Count, Absolute: 87.8 10*3/uL (ref 34.8–93.9)

## 2017-09-08 MED ORDER — DEXAMETHASONE SODIUM PHOSPHATE 10 MG/ML IJ SOLN
INTRAMUSCULAR | Status: AC
  Filled 2017-09-08: qty 1

## 2017-09-08 MED ORDER — PALONOSETRON HCL INJECTION 0.25 MG/5ML
0.2500 mg | Freq: Once | INTRAVENOUS | Status: AC
  Administered 2017-09-08: 0.25 mg via INTRAVENOUS

## 2017-09-08 MED ORDER — PALONOSETRON HCL INJECTION 0.25 MG/5ML
INTRAVENOUS | Status: AC
  Filled 2017-09-08: qty 5

## 2017-09-08 MED ORDER — DEXAMETHASONE SODIUM PHOSPHATE 10 MG/ML IJ SOLN
10.0000 mg | Freq: Once | INTRAMUSCULAR | Status: AC
  Administered 2017-09-08: 10 mg via INTRAVENOUS

## 2017-09-08 MED ORDER — DEXTROSE 5 % IV SOLN: Freq: Once | INTRAVENOUS | Status: DC

## 2017-09-08 MED ORDER — SODIUM CHLORIDE 0.9 % IV SOLN
400.0000 mg/m2 | Freq: Once | INTRAVENOUS | Status: AC
  Administered 2017-09-08: 964 mg via INTRAVENOUS
  Filled 2017-09-08: qty 48.2

## 2017-09-08 MED ORDER — SODIUM CHLORIDE 0.9 % IV SOLN
2400.0000 mg/m2 | INTRAVENOUS | Status: DC
  Administered 2017-09-08: 5800 mg via INTRAVENOUS
  Filled 2017-09-08: qty 116

## 2017-09-08 NOTE — Telephone Encounter (Signed)
Gave avs and calendar for February  °

## 2017-09-08 NOTE — Patient Instructions (Signed)
[place] Discharge Instructions for Patients Receiving Chemotherapy  Today you received the following chemotherapy agents:  Leucovorin, Adrucil  To help prevent nausea and vomiting after your treatment, we encourage you to take your nausea medication as prescribed.   If you develop nausea and vomiting that is not controlled by your nausea medication, call the clinic.   BELOW ARE SYMPTOMS THAT SHOULD BE REPORTED IMMEDIATELY:  *FEVER GREATER THAN 100.5 F  *CHILLS WITH OR WITHOUT FEVER  NAUSEA AND VOMITING THAT IS NOT CONTROLLED WITH YOUR NAUSEA MEDICATION  *UNUSUAL SHORTNESS OF BREATH  *UNUSUAL BRUISING OR BLEEDING  TENDERNESS IN MOUTH AND THROAT WITH OR WITHOUT PRESENCE OF ULCERS  *URINARY PROBLEMS  *BOWEL PROBLEMS  UNUSUAL RASH Items with * indicate a potential emergency and should be followed up as soon as possible.  Feel free to call the clinic should you have any questions or concerns. The clinic phone number is [phone].  Please show the [place] at check-in to the Emergency Department and triage nurse.

## 2017-09-08 NOTE — Patient Instructions (Signed)
Thank you for choosing [facility] to provide your oncology and hematology care.  To afford each patient quality time with our providers, please arrive 30 minutes before your scheduled appointment time.  If you arrive late for your appointment, you may be asked to reschedule.  We strive to give you quality time with our providers, and arriving late affects you and other patients whose appointments are after yours.   If you are a no show for multiple scheduled visits, you may be dismissed from the clinic at the providers discretion.    Again, thank you for choosing [facility], our hope is that these requests will decrease the amount of time that you wait before being seen by our physicians.  ______________________________________________________________________  Should you have questions after your visit to the [facility], please contact our office at [phone] between the hours of 8:30 and 4:30 p.m.    Voicemails left after 4:30p.m will not be returned until the following business day.    For prescription refill requests, please have your pharmacy contact us directly.  Please also try to allow 48 hours for prescription requests.    Please contact the scheduling department for questions regarding scheduling.  For scheduling of procedures such as PET scans, CT scans, MRI, Ultrasound, etc please contact central scheduling at [phone].    Resources For Cancer Patients and Caregivers:   Oncolink.org:  A wonderful resource for patients and healthcare providers for information regarding your disease, ways to tract your treatment, what to expect, etc.     American Cancer Society:  800-227-2345  Can help patients locate various types of support and financial assistance  Cancer Care: 1-800-813-HOPE (4673) Provides financial assistance, online support groups, medication/co-pay assistance.    [place] DSS:  336-641-3447 Where to apply for food  stamps, Medicaid, and utility assistance  Medicare Rights Center: 800-333-4114 Helps people with Medicare understand their rights and benefits, navigate the Medicare system, and secure the quality healthcare they deserve  SCAT: 336-333-6589 [place] [employer]'s shared-ride transportation service for eligible riders who have a disability that prevents them from riding the fixed route bus.    For additional information on assistance programs please contact our social worker:   Grier Hock/Abigail Elmore:  [phone]            

## 2017-09-10 ENCOUNTER — Inpatient Hospital Stay: Payer: 59

## 2017-09-10 VITALS — BP 120/69 | HR 90 | Temp 98.1°F | Resp 20

## 2017-09-10 DIAGNOSIS — C189 Malignant neoplasm of colon, unspecified: Secondary | ICD-10-CM

## 2017-09-10 DIAGNOSIS — C787 Secondary malignant neoplasm of liver and intrahepatic bile duct: Secondary | ICD-10-CM

## 2017-09-10 DIAGNOSIS — Z7189 Other specified counseling: Secondary | ICD-10-CM

## 2017-09-10 DIAGNOSIS — C182 Malignant neoplasm of ascending colon: Secondary | ICD-10-CM | POA: Diagnosis not present

## 2017-09-10 MED ORDER — SODIUM CHLORIDE 0.9% FLUSH
10.0000 mL | INTRAVENOUS | Status: DC | PRN
  Administered 2017-09-10: 10 mL
  Filled 2017-09-10: qty 10

## 2017-09-10 MED ORDER — HEPARIN SOD (PORK) LOCK FLUSH 100 UNIT/ML IV SOLN
500.0000 [IU] | Freq: Once | INTRAVENOUS | Status: AC | PRN
  Administered 2017-09-10: 500 [IU]
  Filled 2017-09-10: qty 5

## 2017-09-10 NOTE — Patient Instructions (Signed)
Implanted Port Home Guide An implanted port is a type of central line that is placed under the skin. Central lines are used to provide IV access when treatment or nutrition needs to be given through a person's veins. Implanted ports are used for long-term IV access. An implanted port may be placed because:  You need IV medicine that would be irritating to the small veins in your hands or arms.  You need long-term IV medicines, such as antibiotics.  You need IV nutrition for a long period.  You need frequent blood draws for lab tests.  You need dialysis.  Implanted ports are usually placed in the chest area, but they can also be placed in the upper arm, the abdomen, or the leg. An implanted port has two main parts:  Reservoir. The reservoir is round and will appear as a small, raised area under your skin. The reservoir is the part where a needle is inserted to give medicines or draw blood.  Catheter. The catheter is a thin, flexible tube that extends from the reservoir. The catheter is placed into a large vein. Medicine that is inserted into the reservoir goes into the catheter and then into the vein.  How will I care for my incision site? Do not get the incision site wet. Bathe or shower as directed by your health care provider. How is my port accessed? Special steps must be taken to access the port:  Before the port is accessed, a numbing cream can be placed on the skin. This helps numb the skin over the port site.  Your health care provider uses a sterile technique to access the port. ? Your health care provider must put on a mask and sterile gloves. ? The skin over your port is cleaned carefully with an antiseptic and allowed to dry. ? The port is gently pinched between sterile gloves, and a needle is inserted into the port.  Only "non-coring" port needles should be used to access the port. Once the port is accessed, a blood return should be checked. This helps ensure that the port  is in the vein and is not clogged.  If your port needs to remain accessed for a constant infusion, a clear (transparent) bandage will be placed over the needle site. The bandage and needle will need to be changed every week, or as directed by your health care provider.  Keep the bandage covering the needle clean and dry. Do not get it wet. Follow your health care provider's instructions on how to take a shower or bath while the port is accessed.  If your port does not need to stay accessed, no bandage is needed over the port.  What is flushing? Flushing helps keep the port from getting clogged. Follow your health care provider's instructions on how and when to flush the port. Ports are usually flushed with saline solution or a medicine called heparin. The need for flushing will depend on how the port is used.  If the port is used for intermittent medicines or blood draws, the port will need to be flushed: ? After medicines have been given. ? After blood has been drawn. ? As part of routine maintenance.  If a constant infusion is running, the port may not need to be flushed.  How long will my port stay implanted? The port can stay in for as long as your health care provider thinks it is needed. When it is time for the port to come out, surgery will be   done to remove it. The procedure is similar to the one performed when the port was put in. When should I seek immediate medical care? When you have an implanted port, you should seek immediate medical care if:  You notice a bad smell coming from the incision site.  You have swelling, redness, or drainage at the incision site.  You have more swelling or pain at the port site or the surrounding area.  You have a fever that is not controlled with medicine.  This information is not intended to replace advice given to you by your health care provider. Make sure you discuss any questions you have with your health care provider. Document  Released: 08/03/2005 Document Revised: 01/09/2016 Document Reviewed: 04/10/2013 Elsevier Interactive Patient Education  2017 [employer].  

## 2017-09-16 ENCOUNTER — Ambulatory Visit: Payer: 59

## 2017-09-16 ENCOUNTER — Other Ambulatory Visit: Payer: 59

## 2017-09-16 ENCOUNTER — Ambulatory Visit: Payer: 59 | Admitting: Hematology

## 2017-09-23 ENCOUNTER — Encounter: Payer: Self-pay | Admitting: Hematology

## 2017-09-23 ENCOUNTER — Inpatient Hospital Stay: Payer: 59

## 2017-09-23 ENCOUNTER — Inpatient Hospital Stay: Payer: 59 | Admitting: Hematology

## 2017-09-23 ENCOUNTER — Inpatient Hospital Stay: Payer: 59 | Attending: Hematology

## 2017-09-23 DIAGNOSIS — Z7189 Other specified counseling: Secondary | ICD-10-CM

## 2017-09-23 DIAGNOSIS — C786 Secondary malignant neoplasm of retroperitoneum and peritoneum: Secondary | ICD-10-CM | POA: Insufficient documentation

## 2017-09-23 DIAGNOSIS — C189 Malignant neoplasm of colon, unspecified: Secondary | ICD-10-CM

## 2017-09-23 DIAGNOSIS — Z5111 Encounter for antineoplastic chemotherapy: Secondary | ICD-10-CM | POA: Insufficient documentation

## 2017-09-23 DIAGNOSIS — C787 Secondary malignant neoplasm of liver and intrahepatic bile duct: Secondary | ICD-10-CM

## 2017-09-23 DIAGNOSIS — Z95828 Presence of other vascular implants and grafts: Secondary | ICD-10-CM

## 2017-09-23 DIAGNOSIS — C182 Malignant neoplasm of ascending colon: Secondary | ICD-10-CM | POA: Diagnosis present

## 2017-09-23 DIAGNOSIS — D500 Iron deficiency anemia secondary to blood loss (chronic): Secondary | ICD-10-CM

## 2017-09-23 LAB — CBC WITH DIFFERENTIAL (CANCER CENTER ONLY)
Basophils Absolute: 0 10*3/uL (ref 0.0–0.1)
Basophils Relative: 0 %
Eosinophils Absolute: 0.2 10*3/uL (ref 0.0–0.5)
Eosinophils Relative: 4 %
HEMATOCRIT: 41.4 % (ref 38.4–49.9)
Hemoglobin: 14.2 g/dL (ref 13.0–17.1)
LYMPHS PCT: 16 %
Lymphs Abs: 0.7 10*3/uL — ABNORMAL LOW (ref 0.9–3.3)
MCH: 32.1 pg (ref 27.2–33.4)
MCHC: 34.3 g/dL (ref 32.0–36.0)
MCV: 93.5 fL (ref 79.3–98.0)
MONO ABS: 0.6 10*3/uL (ref 0.1–0.9)
MONOS PCT: 14 %
Neutro Abs: 3 10*3/uL (ref 1.5–6.5)
Neutrophils Relative %: 66 %
Platelet Count: 97 10*3/uL — ABNORMAL LOW (ref 140–400)
RBC: 4.43 MIL/uL (ref 4.20–5.82)
RDW: 14.1 % (ref 11.0–14.6)
WBC Count: 4.6 10*3/uL (ref 4.0–10.3)

## 2017-09-23 LAB — COMPREHENSIVE METABOLIC PANEL
ALBUMIN: 4 g/dL (ref 3.5–5.0)
ALT: 53 U/L (ref 0–55)
AST: 36 U/L — ABNORMAL HIGH (ref 5–34)
Alkaline Phosphatase: 144 U/L (ref 40–150)
Anion gap: 10 (ref 3–11)
BUN: 12 mg/dL (ref 7–26)
CHLORIDE: 105 mmol/L (ref 98–109)
CO2: 24 mmol/L (ref 22–29)
Calcium: 8.9 mg/dL (ref 8.4–10.4)
Creatinine, Ser: 0.97 mg/dL (ref 0.70–1.30)
GFR calc Af Amer: >60 mL/min (ref >60)
GFR calc non Af Amer: >60 mL/min (ref >60)
GLUCOSE: 361 mg/dL — AB (ref 70–140)
POTASSIUM: 4.1 mmol/L (ref 3.5–5.1)
SODIUM: 139 mmol/L (ref 136–145)
Total Bilirubin: 1 mg/dL (ref 0.2–1.2)
Total Protein: 6.7 g/dL (ref 6.4–8.3)

## 2017-09-23 LAB — RETICULOCYTES
RBC.: 4.43 MIL/uL (ref 4.20–5.82)
RETIC COUNT ABSOLUTE: 106.3 10*3/uL — AB (ref 34.8–93.9)
Retic Ct Pct: 2.4 % — ABNORMAL HIGH (ref 0.8–1.8)

## 2017-09-23 MED ORDER — SODIUM CHLORIDE 0.9 % IV SOLN
2400.0000 mg/m2 | INTRAVENOUS | Status: DC
  Administered 2017-09-23: 5800 mg via INTRAVENOUS
  Filled 2017-09-23: qty 116

## 2017-09-23 MED ORDER — DEXAMETHASONE SODIUM PHOSPHATE 10 MG/ML IJ SOLN
INTRAMUSCULAR | Status: AC
  Filled 2017-09-23: qty 1

## 2017-09-23 MED ORDER — HEPARIN SOD (PORK) LOCK FLUSH 100 UNIT/ML IV SOLN
500.0000 [IU] | Freq: Once | INTRAVENOUS | Status: DC | PRN
  Filled 2017-09-23: qty 5

## 2017-09-23 MED ORDER — SODIUM CHLORIDE 0.9% FLUSH
10.0000 mL | INTRAVENOUS | Status: DC | PRN
  Filled 2017-09-23: qty 10

## 2017-09-23 MED ORDER — PALONOSETRON HCL INJECTION 0.25 MG/5ML
INTRAVENOUS | Status: AC
  Filled 2017-09-23: qty 5

## 2017-09-23 MED ORDER — DEXAMETHASONE SODIUM PHOSPHATE 10 MG/ML IJ SOLN
10.0000 mg | Freq: Once | INTRAMUSCULAR | Status: AC
  Administered 2017-09-23: 10 mg via INTRAVENOUS

## 2017-09-23 MED ORDER — SODIUM CHLORIDE 0.9% FLUSH
10.0000 mL | INTRAVENOUS | Status: DC | PRN
  Administered 2017-09-23: 10 mL via INTRAVENOUS
  Filled 2017-09-23: qty 10

## 2017-09-23 MED ORDER — SODIUM CHLORIDE 0.9 % IV SOLN
400.0000 mg/m2 | Freq: Once | INTRAVENOUS | Status: AC
  Administered 2017-09-23: 964 mg via INTRAVENOUS
  Filled 2017-09-23: qty 48.2

## 2017-09-23 MED ORDER — PALONOSETRON HCL INJECTION 0.25 MG/5ML
0.2500 mg | Freq: Once | INTRAVENOUS | Status: AC
  Administered 2017-09-23: 0.25 mg via INTRAVENOUS

## 2017-09-23 MED ORDER — SODIUM CHLORIDE 0.9 % IV SOLN
Freq: Once | INTRAVENOUS | Status: AC
  Administered 2017-09-23: 09:00:00 via INTRAVENOUS

## 2017-09-23 MED ORDER — DEXTROSE 5 % IV SOLN: Freq: Once | INTRAVENOUS | Status: DC

## 2017-09-23 NOTE — Patient Instructions (Signed)
[facility] Discharge Instructions for Patients Receiving Chemotherapy  Today you received the following chemotherapy agents Leucovorin and adrucil  To help prevent nausea and vomiting after your treatment, we encourage you to take your nausea medication as directed  If you develop nausea and vomiting that is not controlled by your nausea medication, call the clinic.   BELOW ARE SYMPTOMS THAT SHOULD BE REPORTED IMMEDIATELY:  *FEVER GREATER THAN 100.5 F  *CHILLS WITH OR WITHOUT FEVER  NAUSEA AND VOMITING THAT IS NOT CONTROLLED WITH YOUR NAUSEA MEDICATION  *UNUSUAL SHORTNESS OF BREATH  *UNUSUAL BRUISING OR BLEEDING  TENDERNESS IN MOUTH AND THROAT WITH OR WITHOUT PRESENCE OF ULCERS  *URINARY PROBLEMS  *BOWEL PROBLEMS  UNUSUAL RASH Items with * indicate a potential emergency and should be followed up as soon as possible.  Feel free to call the clinic should you have any questions or concerns. The clinic phone number is [phone].  Please show the [place] at check-in to the Emergency Department and triage nurse.

## 2017-09-23 NOTE — Patient Instructions (Signed)
Thank you for choosing [facility] to provide your oncology and hematology care.  To afford each patient quality time with our providers, please arrive 30 minutes before your scheduled appointment time.  If you arrive late for your appointment, you may be asked to reschedule.  We strive to give you quality time with our providers, and arriving late affects you and other patients whose appointments are after yours.   If you are a no show for multiple scheduled visits, you may be dismissed from the clinic at the providers discretion.    Again, thank you for choosing [facility], our hope is that these requests will decrease the amount of time that you wait before being seen by our physicians.  ______________________________________________________________________  Should you have questions after your visit to the [facility], please contact our office at [phone] between the hours of 8:30 and 4:30 p.m.    Voicemails left after 4:30p.m will not be returned until the following business day.    For prescription refill requests, please have your pharmacy contact us directly.  Please also try to allow 48 hours for prescription requests.    Please contact the scheduling department for questions regarding scheduling.  For scheduling of procedures such as PET scans, CT scans, MRI, Ultrasound, etc please contact central scheduling at [phone].    Resources For Cancer Patients and Caregivers:   Oncolink.org:  A wonderful resource for patients and healthcare providers for information regarding your disease, ways to tract your treatment, what to expect, etc.     American Cancer Society:  800-227-2345  Can help patients locate various types of support and financial assistance  Cancer Care: 1-800-813-HOPE (4673) Provides financial assistance, online support groups, medication/co-pay assistance.    [place] DSS:  336-641-3447 Where to apply for food  stamps, Medicaid, and utility assistance  Medicare Rights Center: 800-333-4114 Helps people with Medicare understand their rights and benefits, navigate the Medicare system, and secure the quality healthcare they deserve  SCAT: 336-333-6589 [place] [employer]'s shared-ride transportation service for eligible riders who have a disability that prevents them from riding the fixed route bus.    For additional information on assistance programs please contact our social worker:   Grier Hock/Abigail Elmore:  [phone]            

## 2017-09-23 NOTE — Patient Instructions (Signed)
Implanted Port Home Guide An implanted port is a type of central line that is placed under the skin. Central lines are used to provide IV access when treatment or nutrition needs to be given through a person's veins. Implanted ports are used for long-term IV access. An implanted port may be placed because:  You need IV medicine that would be irritating to the small veins in your hands or arms.  You need long-term IV medicines, such as antibiotics.  You need IV nutrition for a long period.  You need frequent blood draws for lab tests.  You need dialysis.  Implanted ports are usually placed in the chest area, but they can also be placed in the upper arm, the abdomen, or the leg. An implanted port has two main parts:  Reservoir. The reservoir is round and will appear as a small, raised area under your skin. The reservoir is the part where a needle is inserted to give medicines or draw blood.  Catheter. The catheter is a thin, flexible tube that extends from the reservoir. The catheter is placed into a large vein. Medicine that is inserted into the reservoir goes into the catheter and then into the vein.  How will I care for my incision site? Do not get the incision site wet. Bathe or shower as directed by your health care provider. How is my port accessed? Special steps must be taken to access the port:  Before the port is accessed, a numbing cream can be placed on the skin. This helps numb the skin over the port site.  Your health care provider uses a sterile technique to access the port. ? Your health care provider must put on a mask and sterile gloves. ? The skin over your port is cleaned carefully with an antiseptic and allowed to dry. ? The port is gently pinched between sterile gloves, and a needle is inserted into the port.  Only "non-coring" port needles should be used to access the port. Once the port is accessed, a blood return should be checked. This helps ensure that the port  is in the vein and is not clogged.  If your port needs to remain accessed for a constant infusion, a clear (transparent) bandage will be placed over the needle site. The bandage and needle will need to be changed every week, or as directed by your health care provider.  Keep the bandage covering the needle clean and dry. Do not get it wet. Follow your health care provider's instructions on how to take a shower or bath while the port is accessed.  If your port does not need to stay accessed, no bandage is needed over the port.  What is flushing? Flushing helps keep the port from getting clogged. Follow your health care provider's instructions on how and when to flush the port. Ports are usually flushed with saline solution or a medicine called heparin. The need for flushing will depend on how the port is used.  If the port is used for intermittent medicines or blood draws, the port will need to be flushed: ? After medicines have been given. ? After blood has been drawn. ? As part of routine maintenance.  If a constant infusion is running, the port may not need to be flushed.  How long will my port stay implanted? The port can stay in for as long as your health care provider thinks it is needed. When it is time for the port to come out, surgery will be   done to remove it. The procedure is similar to the one performed when the port was put in. When should I seek immediate medical care? When you have an implanted port, you should seek immediate medical care if:  You notice a bad smell coming from the incision site.  You have swelling, redness, or drainage at the incision site.  You have more swelling or pain at the port site or the surrounding area.  You have a fever that is not controlled with medicine.  This information is not intended to replace advice given to you by your health care provider. Make sure you discuss any questions you have with your health care provider. Document  Released: 08/03/2005 Document Revised: 01/09/2016 Document Reviewed: 04/10/2013 Elsevier Interactive Patient Education  2017 [employer].  

## 2017-09-23 NOTE — Progress Notes (Signed)
Okay to treat with platelet of 97 per Loren per Dr. Irene Limbo.

## 2017-09-25 ENCOUNTER — Inpatient Hospital Stay: Payer: 59

## 2017-09-25 VITALS — BP 136/79 | HR 73 | Temp 97.6°F | Resp 16

## 2017-09-25 DIAGNOSIS — C787 Secondary malignant neoplasm of liver and intrahepatic bile duct: Secondary | ICD-10-CM

## 2017-09-25 DIAGNOSIS — Z7189 Other specified counseling: Secondary | ICD-10-CM

## 2017-09-25 DIAGNOSIS — C189 Malignant neoplasm of colon, unspecified: Secondary | ICD-10-CM

## 2017-09-25 DIAGNOSIS — Z5111 Encounter for antineoplastic chemotherapy: Secondary | ICD-10-CM | POA: Diagnosis not present

## 2017-09-25 MED ORDER — HEPARIN SOD (PORK) LOCK FLUSH 100 UNIT/ML IV SOLN
500.0000 [IU] | Freq: Once | INTRAVENOUS | Status: AC | PRN
  Administered 2017-09-25: 500 [IU]
  Filled 2017-09-25: qty 5

## 2017-09-25 MED ORDER — SODIUM CHLORIDE 0.9% FLUSH
10.0000 mL | INTRAVENOUS | Status: DC | PRN
  Administered 2017-09-25: 10 mL
  Filled 2017-09-25: qty 10

## 2017-09-25 NOTE — Patient Instructions (Signed)
Implanted Port Home Guide An implanted port is a type of central line that is placed under the skin. Central lines are used to provide IV access when treatment or nutrition needs to be given through a person's veins. Implanted ports are used for long-term IV access. An implanted port may be placed because:  You need IV medicine that would be irritating to the small veins in your hands or arms.  You need long-term IV medicines, such as antibiotics.  You need IV nutrition for a long period.  You need frequent blood draws for lab tests.  You need dialysis.  Implanted ports are usually placed in the chest area, but they can also be placed in the upper arm, the abdomen, or the leg. An implanted port has two main parts:  Reservoir. The reservoir is round and will appear as a small, raised area under your skin. The reservoir is the part where a needle is inserted to give medicines or draw blood.  Catheter. The catheter is a thin, flexible tube that extends from the reservoir. The catheter is placed into a large vein. Medicine that is inserted into the reservoir goes into the catheter and then into the vein.  How will I care for my incision site? Do not get the incision site wet. Bathe or shower as directed by your health care provider. How is my port accessed? Special steps must be taken to access the port:  Before the port is accessed, a numbing cream can be placed on the skin. This helps numb the skin over the port site.  Your health care provider uses a sterile technique to access the port. ? Your health care provider must put on a mask and sterile gloves. ? The skin over your port is cleaned carefully with an antiseptic and allowed to dry. ? The port is gently pinched between sterile gloves, and a needle is inserted into the port.  Only "non-coring" port needles should be used to access the port. Once the port is accessed, a blood return should be checked. This helps ensure that the port  is in the vein and is not clogged.  If your port needs to remain accessed for a constant infusion, a clear (transparent) bandage will be placed over the needle site. The bandage and needle will need to be changed every week, or as directed by your health care provider.  Keep the bandage covering the needle clean and dry. Do not get it wet. Follow your health care provider's instructions on how to take a shower or bath while the port is accessed.  If your port does not need to stay accessed, no bandage is needed over the port.  What is flushing? Flushing helps keep the port from getting clogged. Follow your health care provider's instructions on how and when to flush the port. Ports are usually flushed with saline solution or a medicine called heparin. The need for flushing will depend on how the port is used.  If the port is used for intermittent medicines or blood draws, the port will need to be flushed: ? After medicines have been given. ? After blood has been drawn. ? As part of routine maintenance.  If a constant infusion is running, the port may not need to be flushed.  How long will my port stay implanted? The port can stay in for as long as your health care provider thinks it is needed. When it is time for the port to come out, surgery will be   done to remove it. The procedure is similar to the one performed when the port was put in. When should I seek immediate medical care? When you have an implanted port, you should seek immediate medical care if:  You notice a bad smell coming from the incision site.  You have swelling, redness, or drainage at the incision site.  You have more swelling or pain at the port site or the surrounding area.  You have a fever that is not controlled with medicine.  This information is not intended to replace advice given to you by your health care provider. Make sure you discuss any questions you have with your health care provider. Document  Released: 08/03/2005 Document Revised: 01/09/2016 Document Reviewed: 04/10/2013 Elsevier Interactive Patient Education  2017 [employer].  

## 2017-09-27 NOTE — Progress Notes (Signed)
This encounter was created in error - please disregard.

## 2017-09-30 DIAGNOSIS — C189 Malignant neoplasm of colon, unspecified: Secondary | ICD-10-CM | POA: Diagnosis not present

## 2017-09-30 DIAGNOSIS — R161 Splenomegaly, not elsewhere classified: Secondary | ICD-10-CM | POA: Diagnosis not present

## 2017-09-30 DIAGNOSIS — C787 Secondary malignant neoplasm of liver and intrahepatic bile duct: Secondary | ICD-10-CM | POA: Diagnosis not present

## 2017-10-04 DIAGNOSIS — E7800 Pure hypercholesterolemia, unspecified: Secondary | ICD-10-CM | POA: Diagnosis not present

## 2017-10-04 DIAGNOSIS — E119 Type 2 diabetes mellitus without complications: Secondary | ICD-10-CM | POA: Diagnosis not present

## 2017-10-04 DIAGNOSIS — G620 Drug-induced polyneuropathy: Secondary | ICD-10-CM | POA: Diagnosis not present

## 2017-10-07 ENCOUNTER — Inpatient Hospital Stay: Payer: 59

## 2017-10-07 ENCOUNTER — Ambulatory Visit: Payer: 59 | Admitting: Hematology

## 2017-10-07 ENCOUNTER — Ambulatory Visit: Payer: 59

## 2017-10-07 VITALS — BP 133/90 | HR 80 | Temp 98.4°F | Resp 16

## 2017-10-07 DIAGNOSIS — Z7189 Other specified counseling: Secondary | ICD-10-CM

## 2017-10-07 DIAGNOSIS — C189 Malignant neoplasm of colon, unspecified: Secondary | ICD-10-CM | POA: Diagnosis not present

## 2017-10-07 DIAGNOSIS — C787 Secondary malignant neoplasm of liver and intrahepatic bile duct: Principal | ICD-10-CM

## 2017-10-07 DIAGNOSIS — Z95828 Presence of other vascular implants and grafts: Secondary | ICD-10-CM

## 2017-10-07 DIAGNOSIS — D500 Iron deficiency anemia secondary to blood loss (chronic): Secondary | ICD-10-CM

## 2017-10-07 DIAGNOSIS — Z5111 Encounter for antineoplastic chemotherapy: Secondary | ICD-10-CM | POA: Diagnosis not present

## 2017-10-07 LAB — COMPREHENSIVE METABOLIC PANEL
ALBUMIN: 4.3 g/dL (ref 3.5–5.0)
ALT: 64 U/L — ABNORMAL HIGH (ref 0–55)
ANION GAP: 12 — AB (ref 3–11)
AST: 38 U/L — AB (ref 5–34)
Alkaline Phosphatase: 153 U/L — ABNORMAL HIGH (ref 40–150)
BUN: 7 mg/dL (ref 7–26)
CHLORIDE: 101 mmol/L (ref 98–109)
CO2: 24 mmol/L (ref 22–29)
Calcium: 10.2 mg/dL (ref 8.4–10.4)
Creatinine, Ser: 0.99 mg/dL (ref 0.70–1.30)
GFR calc Af Amer: >60 mL/min (ref >60)
GFR calc non Af Amer: >60 mL/min (ref >60)
GLUCOSE: 258 mg/dL — AB (ref 70–140)
Potassium: 4.3 mmol/L (ref 3.5–5.1)
SODIUM: 137 mmol/L (ref 136–145)
Total Bilirubin: 1.1 mg/dL (ref 0.2–1.2)
Total Protein: 7.1 g/dL (ref 6.4–8.3)

## 2017-10-07 LAB — CBC WITH DIFFERENTIAL (CANCER CENTER ONLY)
BASOS ABS: 0 10*3/uL (ref 0.0–0.1)
Basophils Relative: 1 %
EOS PCT: 2 %
Eosinophils Absolute: 0.1 10*3/uL (ref 0.0–0.5)
HCT: 44 % (ref 38.4–49.9)
Hemoglobin: 15.1 g/dL (ref 13.0–17.1)
LYMPHS ABS: 0.9 10*3/uL (ref 0.9–3.3)
LYMPHS PCT: 14 %
MCH: 32.4 pg (ref 27.2–33.4)
MCHC: 34.3 g/dL (ref 32.0–36.0)
MCV: 94.4 fL (ref 79.3–98.0)
MONO ABS: 0.7 10*3/uL (ref 0.1–0.9)
MONOS PCT: 12 %
Neutro Abs: 4.4 10*3/uL (ref 1.5–6.5)
Neutrophils Relative %: 71 %
Platelet Count: 123 10*3/uL — ABNORMAL LOW (ref 140–400)
RBC: 4.66 MIL/uL (ref 4.20–5.82)
RDW: 14.3 % (ref 11.0–14.6)
WBC Count: 6.2 10*3/uL (ref 4.0–10.3)

## 2017-10-07 LAB — RETICULOCYTES
RBC.: 4.66 MIL/uL (ref 4.20–5.82)
RETIC COUNT ABSOLUTE: 97.9 10*3/uL — AB (ref 34.8–93.9)
Retic Ct Pct: 2.1 % — ABNORMAL HIGH (ref 0.8–1.8)

## 2017-10-07 MED ORDER — SODIUM CHLORIDE 0.9 % IV SOLN
400.0000 mg/m2 | Freq: Once | INTRAVENOUS | Status: AC
  Administered 2017-10-07: 964 mg via INTRAVENOUS
  Filled 2017-10-07: qty 48.2

## 2017-10-07 MED ORDER — SODIUM CHLORIDE 0.9 % IV SOLN
2400.0000 mg/m2 | INTRAVENOUS | Status: DC
  Administered 2017-10-07: 5800 mg via INTRAVENOUS
  Filled 2017-10-07: qty 116

## 2017-10-07 MED ORDER — PALONOSETRON HCL INJECTION 0.25 MG/5ML
0.2500 mg | Freq: Once | INTRAVENOUS | Status: AC
  Administered 2017-10-07: 0.25 mg via INTRAVENOUS

## 2017-10-07 MED ORDER — DEXAMETHASONE SODIUM PHOSPHATE 10 MG/ML IJ SOLN
10.0000 mg | Freq: Once | INTRAMUSCULAR | Status: AC
  Administered 2017-10-07: 10 mg via INTRAVENOUS

## 2017-10-07 MED ORDER — SODIUM CHLORIDE 0.9% FLUSH
10.0000 mL | INTRAVENOUS | Status: DC | PRN
  Filled 2017-10-07: qty 10

## 2017-10-07 MED ORDER — HEPARIN SOD (PORK) LOCK FLUSH 100 UNIT/ML IV SOLN
500.0000 [IU] | Freq: Once | INTRAVENOUS | Status: DC | PRN
  Filled 2017-10-07: qty 5

## 2017-10-07 MED ORDER — DEXAMETHASONE SODIUM PHOSPHATE 10 MG/ML IJ SOLN
INTRAMUSCULAR | Status: AC
  Filled 2017-10-07: qty 1

## 2017-10-07 MED ORDER — SODIUM CHLORIDE 0.9% FLUSH
10.0000 mL | INTRAVENOUS | Status: DC | PRN
  Administered 2017-10-07: 10 mL via INTRAVENOUS
  Filled 2017-10-07: qty 10

## 2017-10-07 MED ORDER — DEXTROSE 5 % IV SOLN
Freq: Once | INTRAVENOUS | Status: AC
  Administered 2017-10-07: 09:00:00 via INTRAVENOUS

## 2017-10-07 MED ORDER — PALONOSETRON HCL INJECTION 0.25 MG/5ML
INTRAVENOUS | Status: AC
  Filled 2017-10-07: qty 5

## 2017-10-07 NOTE — Patient Instructions (Signed)
Implanted Port Home Guide An implanted port is a type of central line that is placed under the skin. Central lines are used to provide IV access when treatment or nutrition needs to be given through a person's veins. Implanted ports are used for long-term IV access. An implanted port may be placed because:  You need IV medicine that would be irritating to the small veins in your hands or arms.  You need long-term IV medicines, such as antibiotics.  You need IV nutrition for a long period.  You need frequent blood draws for lab tests.  You need dialysis.  Implanted ports are usually placed in the chest area, but they can also be placed in the upper arm, the abdomen, or the leg. An implanted port has two main parts:  Reservoir. The reservoir is round and will appear as a small, raised area under your skin. The reservoir is the part where a needle is inserted to give medicines or draw blood.  Catheter. The catheter is a thin, flexible tube that extends from the reservoir. The catheter is placed into a large vein. Medicine that is inserted into the reservoir goes into the catheter and then into the vein.  How will I care for my incision site? Do not get the incision site wet. Bathe or shower as directed by your health care provider. How is my port accessed? Special steps must be taken to access the port:  Before the port is accessed, a numbing cream can be placed on the skin. This helps numb the skin over the port site.  Your health care provider uses a sterile technique to access the port. ? Your health care provider must put on a mask and sterile gloves. ? The skin over your port is cleaned carefully with an antiseptic and allowed to dry. ? The port is gently pinched between sterile gloves, and a needle is inserted into the port.  Only "non-coring" port needles should be used to access the port. Once the port is accessed, a blood return should be checked. This helps ensure that the port  is in the vein and is not clogged.  If your port needs to remain accessed for a constant infusion, a clear (transparent) bandage will be placed over the needle site. The bandage and needle will need to be changed every week, or as directed by your health care provider.  Keep the bandage covering the needle clean and dry. Do not get it wet. Follow your health care provider's instructions on how to take a shower or bath while the port is accessed.  If your port does not need to stay accessed, no bandage is needed over the port.  What is flushing? Flushing helps keep the port from getting clogged. Follow your health care provider's instructions on how and when to flush the port. Ports are usually flushed with saline solution or a medicine called heparin. The need for flushing will depend on how the port is used.  If the port is used for intermittent medicines or blood draws, the port will need to be flushed: ? After medicines have been given. ? After blood has been drawn. ? As part of routine maintenance.  If a constant infusion is running, the port may not need to be flushed.  How long will my port stay implanted? The port can stay in for as long as your health care provider thinks it is needed. When it is time for the port to come out, surgery will be   done to remove it. The procedure is similar to the one performed when the port was put in. When should I seek immediate medical care? When you have an implanted port, you should seek immediate medical care if:  You notice a bad smell coming from the incision site.  You have swelling, redness, or drainage at the incision site.  You have more swelling or pain at the port site or the surrounding area.  You have a fever that is not controlled with medicine.  This information is not intended to replace advice given to you by your health care provider. Make sure you discuss any questions you have with your health care provider. Document  Released: 08/03/2005 Document Revised: 01/09/2016 Document Reviewed: 04/10/2013 Elsevier Interactive Patient Education  2017 [employer].  

## 2017-10-07 NOTE — Patient Instructions (Signed)
[facility] Discharge Instructions for Patients Receiving Chemotherapy  Today you received the following chemotherapy agents Leucovorin and adrucil  To help prevent nausea and vomiting after your treatment, we encourage you to take your nausea medication as directed  If you develop nausea and vomiting that is not controlled by your nausea medication, call the clinic.   BELOW ARE SYMPTOMS THAT SHOULD BE REPORTED IMMEDIATELY:  *FEVER GREATER THAN 100.5 F  *CHILLS WITH OR WITHOUT FEVER  NAUSEA AND VOMITING THAT IS NOT CONTROLLED WITH YOUR NAUSEA MEDICATION  *UNUSUAL SHORTNESS OF BREATH  *UNUSUAL BRUISING OR BLEEDING  TENDERNESS IN MOUTH AND THROAT WITH OR WITHOUT PRESENCE OF ULCERS  *URINARY PROBLEMS  *BOWEL PROBLEMS  UNUSUAL RASH Items with * indicate a potential emergency and should be followed up as soon as possible.  Feel free to call the clinic should you have any questions or concerns. The clinic phone number is [phone].  Please show the [place] at check-in to the Emergency Department and triage nurse.

## 2017-10-09 ENCOUNTER — Inpatient Hospital Stay: Payer: 59

## 2017-10-09 VITALS — BP 115/72 | HR 81 | Temp 98.6°F | Resp 16

## 2017-10-09 DIAGNOSIS — C787 Secondary malignant neoplasm of liver and intrahepatic bile duct: Secondary | ICD-10-CM

## 2017-10-09 DIAGNOSIS — Z7189 Other specified counseling: Secondary | ICD-10-CM

## 2017-10-09 DIAGNOSIS — Z5111 Encounter for antineoplastic chemotherapy: Secondary | ICD-10-CM | POA: Diagnosis not present

## 2017-10-09 DIAGNOSIS — C189 Malignant neoplasm of colon, unspecified: Secondary | ICD-10-CM

## 2017-10-09 MED ORDER — SODIUM CHLORIDE 0.9% FLUSH
10.0000 mL | INTRAVENOUS | Status: DC | PRN
  Administered 2017-10-09: 10 mL
  Filled 2017-10-09: qty 10

## 2017-10-09 MED ORDER — HEPARIN SOD (PORK) LOCK FLUSH 100 UNIT/ML IV SOLN
500.0000 [IU] | Freq: Once | INTRAVENOUS | Status: AC | PRN
  Administered 2017-10-09: 500 [IU]
  Filled 2017-10-09: qty 5

## 2017-10-09 NOTE — Patient Instructions (Signed)
Implanted Port Home Guide An implanted port is a type of central line that is placed under the skin. Central lines are used to provide IV access when treatment or nutrition needs to be given through a person's veins. Implanted ports are used for long-term IV access. An implanted port may be placed because:  You need IV medicine that would be irritating to the small veins in your hands or arms.  You need long-term IV medicines, such as antibiotics.  You need IV nutrition for a long period.  You need frequent blood draws for lab tests.  You need dialysis.  Implanted ports are usually placed in the chest area, but they can also be placed in the upper arm, the abdomen, or the leg. An implanted port has two main parts:  Reservoir. The reservoir is round and will appear as a small, raised area under your skin. The reservoir is the part where a needle is inserted to give medicines or draw blood.  Catheter. The catheter is a thin, flexible tube that extends from the reservoir. The catheter is placed into a large vein. Medicine that is inserted into the reservoir goes into the catheter and then into the vein.  How will I care for my incision site? Do not get the incision site wet. Bathe or shower as directed by your health care provider. How is my port accessed? Special steps must be taken to access the port:  Before the port is accessed, a numbing cream can be placed on the skin. This helps numb the skin over the port site.  Your health care provider uses a sterile technique to access the port. ? Your health care provider must put on a mask and sterile gloves. ? The skin over your port is cleaned carefully with an antiseptic and allowed to dry. ? The port is gently pinched between sterile gloves, and a needle is inserted into the port.  Only "non-coring" port needles should be used to access the port. Once the port is accessed, a blood return should be checked. This helps ensure that the port  is in the vein and is not clogged.  If your port needs to remain accessed for a constant infusion, a clear (transparent) bandage will be placed over the needle site. The bandage and needle will need to be changed every week, or as directed by your health care provider.  Keep the bandage covering the needle clean and dry. Do not get it wet. Follow your health care provider's instructions on how to take a shower or bath while the port is accessed.  If your port does not need to stay accessed, no bandage is needed over the port.  What is flushing? Flushing helps keep the port from getting clogged. Follow your health care provider's instructions on how and when to flush the port. Ports are usually flushed with saline solution or a medicine called heparin. The need for flushing will depend on how the port is used.  If the port is used for intermittent medicines or blood draws, the port will need to be flushed: ? After medicines have been given. ? After blood has been drawn. ? As part of routine maintenance.  If a constant infusion is running, the port may not need to be flushed.  How long will my port stay implanted? The port can stay in for as long as your health care provider thinks it is needed. When it is time for the port to come out, surgery will be   done to remove it. The procedure is similar to the one performed when the port was put in. When should I seek immediate medical care? When you have an implanted port, you should seek immediate medical care if:  You notice a bad smell coming from the incision site.  You have swelling, redness, or drainage at the incision site.  You have more swelling or pain at the port site or the surrounding area.  You have a fever that is not controlled with medicine.  This information is not intended to replace advice given to you by your health care provider. Make sure you discuss any questions you have with your health care provider. Document  Released: 08/03/2005 Document Revised: 01/09/2016 Document Reviewed: 04/10/2013 Elsevier Interactive Patient Education  2017 [employer].  

## 2017-10-20 NOTE — Progress Notes (Signed)
Scott Kitchen    HEMATOLOGY/ONCOLOGY CLINIC NOTE  Date of Service: 10/21/17  Patient Care Team: [name], MD as PCP - General (Family Medicine) [name] M.D. 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 KRAS mutated BRAF, NRAS mutation neg MSI Stable.  PET/CT scan done on 06/25/2016 Show multiple foci of hypermetabolic hepatic metastases (atleast 4-5) and 2 hypermetabolic peritoneal nodules along the dorsal peritoneal surface concerning for peritoneal metastases.  MRI Liver 07/27/2016 - Multiple small liver metastases throughout the right and left hepatic lobes, largest measuring 2.1 cm. 1.2 cm enhancing peritoneal nodule in left paracolic gutter, suspicious for peritoneal metastasis. Other small peritoneal nodules better visualized on recent PET-CT which showed hypermetabolic activity, also suspicious for peritoneal metastases.   CT CHEST WO CONTRAST 10/12/2016  [facility] Result Impression   1. Congenital variant of bilateral middle lobes. 2. Groundglass opacification with regions of bronchiectasis in the right lower lobe adjacent to the fissure, consistent with inflammatory/infectious changes. 3. No definitive evidence of metastatic disease to the chest. 4. Probable sebaceous cyst in the subcutaneous tissues beneath the left anterior chest.   MR ABDOMEN AND PELVIS [id number] FOLLOW UP2/26/2018 [facility] Result Impression    Decreased size of multiple hepatic lesions and two peritoneal nodules compared to outside MRI 07/27/2016. No new lesions identified in the  abdomen or pelvis.     PREVIOUS TREATMENT  FOLFOX x 10 cycles. 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 (without 5FU bolus - due to thrombocytopenia). S/p Y-90 treatment to 1 hepatic lobe.  Plan for bilobar hepatic Y90 radio-embolization.  INTERVAL HISTORY   Scott Gallagher is here for follow-up for his metastatic colon cancer and for his next cycle of 5FU/leucovorin for maintenance therapy. The patient's last visit with [place] was on 09/08/17. He is accompanied today by his wife. The pt reports that he is doing well overall and is looking forward to a trip to [place] from May 12-22 and we will adjust his chemo schedule accordingly.   He notes that his neuropathy is stable. He has begun basal insulin, 25 units Basaglar since his last visit. He notes that he needs a refill on his Tramadol.   Of note since the patient's last visit, pt saw Dr. Pecola Lawless and had MRI on 09/30/17 revealing 1. Decreased sizes of multiple T2 hyperintense metastatic lesions (in the setting of patient's known colon adenocarcinoma with mucinous features) in both hepatic lobes (the majority of which are tiny). 2. No new lesions. 3. Posttreatment changes within the right hepatic lobe. 4. Other findings, as detailed above.  Lab results (10/07/17) of CBC, CMP, and Reticulocytes is as follows: all values are WNL except for Platelet Count at 123k, Glucose at 258, AST at 38, ALT at 64, Alkaline Phosphatase at 153.  On review of systems, pt reports eating well, stable neuropathy, lessened abdominal discomfort, and denies imbalance, night sweats, abdominal pain, blood in the stools, mouth sores, abdominal pains, leg swelling, and any other symptoms.    MEDICAL HISTORY:  Past  Medical History:  Diagnosis Date  . Anemia   . Arthritis   . Cancer ([place])   . Diabetes mellitus without complication  (HCC)    diet controlled  . History of blood transfusion   . Hyperlipidemia   . Sleep apnea    cpap  . Wears glasses     SURGICAL HISTORY: Past Surgical History:  Procedure Laterality Date  . COLON SURGERY    . IR GENERIC HISTORICAL  09/24/2016   IR CV LINE INJECTION 09/24/2016 [facility]  . IR GENERIC HISTORICAL  09/24/2016   IR US GUIDE VASC ACCESS RIGHT 09/24/2016 [facility]  . IR GENERIC HISTORICAL  09/24/2016   IR FLUORO GUIDE CV LINE RIGHT 09/24/2016 [facility]  . IR GENERIC HISTORICAL  09/30/2016   IR FLUORO GUIDE PORT INSERTION RIGHT 09/30/2016 Markus Daft, MD [facility]  . IR GENERIC HISTORICAL  09/30/2016   IR US GUIDE VASC ACCESS RIGHT 09/30/2016 Markus Daft, MD [facility]  . IR GENERIC HISTORICAL  09/30/2016   IR REMOVAL TUN ACCESS W/ PORT W/O FL MOD SED 09/30/2016 Markus Daft, MD [facility]  . LAPAROSCOPIC RIGHT HEMI COLECTOMY Right 06/17/2016   Procedure: LAPAROSCOPIC ASSISTED  RIGHT HEMI COLECTOMY;  Surgeon: [name], MD;  Location: [facility];  Service: General;  Laterality: Right;  . PORTACATH PLACEMENT Left 07/13/2016   Procedure: INSERTION PORT-A-CATH left subclavian;  Surgeon: [name], MD;  Location: [facility];  Service: General;  Laterality: Left;  . SHOULDER ACROMIOPLASTY Right 12/20/2014   Procedure: SHOULDER ACROMIOPLASTY;  Surgeon: Melrose Nakayama, MD;  Location: [place];  Service: Orthopedics;  Laterality: Right;  . SHOULDER ARTHROSCOPY Right 12/20/2014   Procedure: RIGHT ARTHROSCOPY SHOULDER WITH DEBRIDEMENT;  Surgeon: Melrose Nakayama, MD;  Location: [place];  Service: Orthopedics;  Laterality: Right;  . TESTICLE SURGERY     as teen  . TONSILLECTOMY    . WISDOM TOOTH EXTRACTION      SOCIAL HISTORY: Social History   Socioeconomic History  . Marital status: Married    Spouse name: Not on file  . Number of children: Not on file  . Years of education: Not on file  . Highest education level: Not on file  Social Needs    . Financial resource strain: Not on file  . Food insecurity - worry: Not on file  . Food insecurity - inability: Not on file  . Transportation needs - medical: Not on file  . Transportation needs - non-medical: Not on file  Occupational History  . Occupation: cemetery maintenance  Tobacco Use  . Smoking status: Former Smoker    Packs/day: 1.50    Years: 29.00    Pack years: 43.50    Types: Cigarettes    Last attempt to quit: 12/16/2012    Years since quitting: 4.8  . Smokeless tobacco: Never Used  . Tobacco comment: 1-2 ppd from age [age]y to 2014  Substance and Sexual Activity  . Alcohol use: Yes    Alcohol/week: 1.8 oz    Types: 3 Shots of liquor per week    Comment:  some days  . Drug use: No  . Sexual activity: Not on file  Other Topics Concern  . Not on file  Social History Narrative  . Not on file    FAMILY HISTORY: Family History  Problem Relation Age of Onset  . Diabetes Other   . Hyperlipidemia Other   . Hypertension Other   . Breast cancer Maternal Aunt 70  .  Prostate cancer Maternal Uncle 75  . Lung cancer Paternal Uncle 67  . Stroke Maternal Grandfather 72  . Cancer Paternal Grandmother 42       dx cancer of pancreas and colon, unknown if separate primaries  . Heart Problems Paternal Grandfather        d. 61  . Prostate cancer Maternal Uncle 25  . Breast cancer Maternal Aunt 75  . Breast cancer Maternal Aunt 68  . Cervical cancer Maternal Aunt 68  . Pancreatic cancer Maternal Aunt 54       d. 58y; heavy smoker  . Colon cancer Paternal Uncle 9       s/p partial colectomy; dx. second colon cancer at age 77-64    ALLERGIES:  is allergic to penicillins.  MEDICATIONS:  Current Outpatient Medications  Medication Sig Dispense Refill  . dexamethasone (DECADRON) 4 MG tablet Take 2 tablets (8 mg total) by mouth daily. Start the day after chemotherapy for 2 days. Take with food. 30 tablet 0  . DULoxetine (CYMBALTA) 60 MG capsule TAKE 1 CAPSULE BY MOUTH  EVERY DAY 30 capsule 1  . lidocaine-prilocaine (EMLA) cream Apply to affected area once 30 g 3  . Multiple Vitamin (MULTIVITAMIN) tablet Take 1 tablet by mouth daily.    . ondansetron (ZOFRAN) 8 MG tablet Take 1 tablet (8 mg total) by mouth 2 (two) times daily as needed for refractory nausea / vomiting. Start on day 3 after chemotherapy. 30 tablet 1  . prochlorperazine (COMPAZINE) 10 MG tablet Take 1 tablet (10 mg total) by mouth every 6 (six) hours as needed (Nausea or vomiting). 30 tablet 1  . traMADol (ULTRAM) 50 MG tablet Take 1 tablet (50 mg total) by mouth every 6 (six) hours as needed for moderate pain or severe pain. 60 tablet 0   No current facility-administered medications for this visit.     REVIEW OF SYSTEMS:    .10 Point review of Systems was done is negative except as noted above.   PHYSICAL EXAMINATION:  ECOG PERFORMANCE STATUS: 1 - Symptomatic but completely ambulatory  . GENERAL:alert, in no acute distress and comfortable SKIN: no acute rashes, no significant lesions EYES: conjunctiva are pink and non-injected, sclera anicteric OROPHARYNX: MMM, no exudates, no oropharyngeal erythema or ulceration NECK: supple, no JVD LYMPH:  no palpable lymphadenopathy in the cervical, axillary or inguinal regions LUNGS: clear to auscultation b/l with normal respiratory effort HEART: regular rate & rhythm ABDOMEN:  normoactive bowel sounds , non tender, not distended. Extremity: no pedal edema PSYCH: alert & oriented x 3 with fluent speech NEURO: no focal motor/sensory deficits   LABORATORY DATA:  I have reviewed the data as listed  .Scott Kitchen CBC Latest Ref Rng & Units 10/07/2017 09/23/2017 09/08/2017  WBC 4.0 - 10.3 K/uL 6.2 4.6 6.0  Hemoglobin 13.0 - 17.1 g/dL - - -  Hematocrit 38.4 - 49.9 % 44.0 41.4 41.4  Platelets 140 - 400 K/uL 123(L) 97(L) 115(L)  HGB 14.8 . CMP Latest Ref Rng & Units 10/21/2017 10/07/2017 09/23/2017  Glucose 70 - 140 mg/dL 254(H) 258(H) 361(H)  BUN 7 - 26 mg/dL  _0 Creatinine 0.70 - 1.30 mg/dL 0.87 0.99 0.97  Sodium 136 - 145 mmol/L 138 137 139  Potassium 3.5 - 5.1 mmol/L 4.1 4.3 4.1  Chloride 98 - 109 mmol/L 104 101 105  CO2 22 - 29 mmol/L _1 Calcium 8.4 - 10.4 mg/dL 9.5 10.2 8.9  Total Protein 6.4 - 8.3 g/dL 6.8  7.1 6.7  Total Bilirubin 0.2 - 1.2 mg/dL 1.0 1.1 1.0  Alkaline Phos 40 - 150 U/L 126 153(H) 144  AST 5 - 34 U/L 30 38(H) 36(H)  ALT 0 - 55 U/L 50 64(H) 53        Microscopic Comment 2. COLON AND RECTUM (INCLUDING TRANS-ANAL RESECTION): Specimen: Terminal ileum, right colon and appendix Procedure: Segmental resection Tumor site: Proximal ascending colon Specimen integrity: Intact Macroscopic intactness of mesorectum: Not applicable: x Complete: NA Near complete: NA Incomplete: NA Cannot be determined (specify): NA Macroscopic tumor perforation: The mass invades through muscularis propria into pericolonic soft tissue Invasive tumor: Maximum size: 5.5 cm Histologic type(s): Adenocarcinoma Histologic grade and differentiation: G3 G1: well differentiated/low grade 1 of 4 Supplemental copy SUPPLEMENTAL for Koroma, Baker ([id number]) Microscopic Comment(continued) G2: moderately differentiated/low grade G3: poorly differentiated/high grade G4: undifferentiated/high grade Type of polyp in which invasive carcinoma arose: Tubular adenoma Microscopic extension of invasive tumor: The mass invade through the muscularis propria into pericolonic soft tissue Lymph-Vascular invasion: Identified Peri-neural invasion: Identified Tumor deposit(s) (discontinuous extramural extension): Present Resection margins: Proximal margin: Negative Distal margin: Negative Circumferential (radial) (posterior ascending, posterior descending; lateral and posterior mid-rectum; and entire lower 1/3 rectum):Negative Mesenteric margin (sigmoid and transverse): NA Distance closest margin (if all above margins negative): 3.7 cm from the  non peritonealized pericolic soft tissue margin Trans-anal resection margins only: Deep margin: NA Mucosal Margin: NA Distance closest mucosal margin (if negative): NA Treatment effect (neo-adjuvant therapy): NA Additional polyp(s): Negative Non-neoplastic findings: Unremarkable Lymph nodes: number examined 18; number positive: 5 Pathologic Staging: pT3, N2a, M1a Ancillary studies: MSI ordered      RADIOGRAPHIC STUDIES: I have personally reviewed the radiological images as listed and agreed with the findings in the report.  MR LIVER WWO CONTRAST2/14/2019 [facility] Result Impression   1.Decreased sizes of multiple T2 hyperintense metastatic lesions (in the setting of patient's known colon adenocarcinoma with mucinous features) in both hepatic lobes (the majority of which are tiny). 2.No new lesions. 3.Posttreatment changes within the right hepatic lobe. 4.Other findings, as detailed above.  Result Narrative  MR LIVER WITH AND WITHOUT CONTRAST, 09/30/2017 10:20 AM  INDICATION: liver malignancy \ liver malignancy \ C18.9 Metastatic colon cancer to liver (HCC) \ C78.7 Metastatic colon cancer to liver ([place])  ADDITIONAL HISTORY: None. COMPARISON: MRI abdomen dated 07/01/2017.  TECHNIQUE: Multiplanar, multisequence MR images of the upper abdomen were obtained before and after intravenous administration of gadolinium-based contrast.   FINDINGS:  LOWER CHEST Heart: Normal size. No pericardial effusion. Lungs/pleura: No masses, consolidations, or effusions.  ABDOMEN Liver: Changes of prior radioembolization in the right hepatic lobe. Perfusional anomaly in the posterior right hepatic lobe, likely a venovenous shunt. Decreased sizes of multiple T2 hyperintense metastatic lesions throughout both hepatic lobes (the majority of which are tiny and are marked in PACS on series 5), with the dominant lesion measuring 10 mm in segment 8 (series 5, image 19),  versus 12 mm previously. No new lesions. Gallbladder: Normal. No stones or inflammatory changes. Biliary: No obstruction. Spleen: Splenomegaly, with the spleen measuring 15.2 cm in span. Pancreas: Normal. Adrenals: Normal. Kidneys: No masses, stones, or obstruction. Unchanged fluid signal lesions in both kidneys, measuring up to 2.0 cm in the interpolar region of the left kidney. Stomach/bowel: Unremarkable.    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 KRAS mutated BRAF, NRAS mutation neg MSI Stable.  PET/CT scan done on 06/25/2016 Show multiple foci of hypermetabolic hepatic metastases (atleast 4-5) and 2 hypermetabolic peritoneal nodules along the dorsal peritoneal surface concerning for peritoneal metastases.  MRI Liver 07/27/2016 - Multiple small liver metastases throughout the right and left hepatic lobes, largest measuring 2.1 cm. 1.2 cm enhancing peritoneal nodule in left paracolic gutter, suspicious for peritoneal metastasis. Other small peritoneal nodules better visualized on recent PET-CT which showed hypermetabolic activity, also suspicious for peritoneal metastases.   MRI Abd 10/12/2016 at [facility] --shows improvement in all the liver and the 2 peritoneal lesions and no new lesions.  MRI liver and CT C/A/P on 01/01/2017 as noted above showed improved/stable disease.   MRI Liver 07/01/2017 at [facility] -  1.Three T2 hyperintense metastatic lesions are seen in the liver, with the largest again in segment 8. These appear unchanged from prior, again without appreciable enhancement or restricted diffusion. Several of the previously noted tiny lesions are no longer visualized. No new lesions identified.  2.Treatment related changes in the posterior right hepatic lobe. 3.A peritoneal nodule  adjacent to the splenic flexure is now more conspicuous compared to the prior study but appears similar to more remote imaging and again could relate to neoplastic disease, although is indeterminate.  CT Abd/pelvis 08/23/2017: Interval resolution of previously seen small right hepatic lobe lesion. Stable tiny sub-cm nodule along the capsular surface of the right hepatic lobe. No new or progressive disease identified. Stable hepatic steatosis with areas of fatty sparing.  MRI 09/30/17:  Decreased sizes of multiple T2 hyperintense metastatic lesions (in the setting of patient's known colon adenocarcinoma with mucinous features) in both hepatic lobes (the majority of which are tiny). No new lesions. Posttreatment changes within the right hepatic lobe. Other findings, as detailed above.   2) Mild thrombocytopenia PLT improved from 84k to 101k (already have held 5FU bolus dose).  -Platelets stable at 123k today.  PLAN  -appreciate input from Dr Pecola Lawless. MRI liver stable in 09/2017 with no indication for additional liver directed therapies. -Reviewed most recent 09/30/17 MRI with Dr. Pecola Lawless which noted no new lesions and decreased sizes of his existing lesions.  --CEA slightly increased with no new focal symptoms. -rpt CEA in 4 weeks -plan to do CT abd/pelvis qmonths but might have to do sooner if increasing CEA levels noted or new symptoms. -No prohibitive toxicities of his maintenance 5FU/leucovorin treatment at this time and he shall continue this.   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 Counseling regarding advanced care planning and goals of care 05/27/2017  . 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 Genetic testing 10/30/2016  . Port catheter in place 09/25/2016  . Family history of colon cancer 07/24/2016    . Family history of prostate cancer 07/24/2016  . Metastatic colon cancer to liver ([place]) 07/01/2016  . Right Colon cancer metastasized to liver ([place]) 06/17/2016  . Chronic GI bleeding 05/29/2016  . Diabetes mellitus type 2, diet-controlled ([place]) 05/22/2016  . OSA (obstructive sleep apnea) 05/22/2016  . Hyperlipidemia 05/22/2016  . Anemia due to blood loss, chronic   . Antineoplastic chemotherapy induced pancytopenia (CODE) ([place]) 05/21/2016   Plan:  -Continue follow-up with primary care physician [name],  MD for optimization of diabetes management - now on basal insulin with improving control.  -Continue use of CPAP for sleep apnea.  Continue 5FU/leucovorin q2weeks --please schedule next 6 cycles of treatment Labs q2weeks with each treatment RTC with Dr Irene Limbo in 4 weeks and 8weeks and 12 weeks with every other treatment   All of the patients questions were answered with apparent satisfaction. The patient knows to call the clinic with any problems, questions or concerns.  . The total time spent in the appointment was 25 minutes and more than 50% was on counseling and direct patient cares.   Sullivan Lone MD [place] AAHIVMS [facility] Hematology/Oncology Physician [place]  (Office):       [phone] (Work cell):  [phone] (Fax):           [phone]  This document serves as a record of services personally performed by Sullivan Lone, MD. It was created on his behalf by Baldwin [place], a trained medical scribe. The creation of this record is based on the scribe's personal observations and the provider's statements to them.   .I have reviewed the above documentation for accuracy and completeness, and I agree with the above. Brunetta Genera MD MS

## 2017-10-21 ENCOUNTER — Other Ambulatory Visit: Payer: 59

## 2017-10-21 ENCOUNTER — Inpatient Hospital Stay: Payer: 59

## 2017-10-21 ENCOUNTER — Inpatient Hospital Stay: Payer: 59 | Attending: Hematology

## 2017-10-21 ENCOUNTER — Inpatient Hospital Stay (HOSPITAL_BASED_OUTPATIENT_CLINIC_OR_DEPARTMENT_OTHER): Payer: 59 | Admitting: Hematology

## 2017-10-21 ENCOUNTER — Ambulatory Visit: Payer: 59

## 2017-10-21 ENCOUNTER — Telehealth: Payer: Self-pay | Admitting: Hematology

## 2017-10-21 VITALS — BP 119/81 | HR 79 | Temp 98.8°F | Resp 18 | Ht 74.0 in | Wt 254.7 lb

## 2017-10-21 DIAGNOSIS — C189 Malignant neoplasm of colon, unspecified: Secondary | ICD-10-CM

## 2017-10-21 DIAGNOSIS — Z800 Family history of malignant neoplasm of digestive organs: Secondary | ICD-10-CM

## 2017-10-21 DIAGNOSIS — Z79899 Other long term (current) drug therapy: Secondary | ICD-10-CM | POA: Diagnosis not present

## 2017-10-21 DIAGNOSIS — D61810 Antineoplastic chemotherapy induced pancytopenia: Secondary | ICD-10-CM | POA: Diagnosis not present

## 2017-10-21 DIAGNOSIS — Z794 Long term (current) use of insulin: Secondary | ICD-10-CM

## 2017-10-21 DIAGNOSIS — Z87891 Personal history of nicotine dependence: Secondary | ICD-10-CM | POA: Insufficient documentation

## 2017-10-21 DIAGNOSIS — G4733 Obstructive sleep apnea (adult) (pediatric): Secondary | ICD-10-CM | POA: Diagnosis not present

## 2017-10-21 DIAGNOSIS — Z803 Family history of malignant neoplasm of breast: Secondary | ICD-10-CM | POA: Insufficient documentation

## 2017-10-21 DIAGNOSIS — E1140 Type 2 diabetes mellitus with diabetic neuropathy, unspecified: Secondary | ICD-10-CM | POA: Insufficient documentation

## 2017-10-21 DIAGNOSIS — Z9221 Personal history of antineoplastic chemotherapy: Secondary | ICD-10-CM

## 2017-10-21 DIAGNOSIS — D500 Iron deficiency anemia secondary to blood loss (chronic): Secondary | ICD-10-CM

## 2017-10-21 DIAGNOSIS — C787 Secondary malignant neoplasm of liver and intrahepatic bile duct: Principal | ICD-10-CM

## 2017-10-21 DIAGNOSIS — C182 Malignant neoplasm of ascending colon: Secondary | ICD-10-CM

## 2017-10-21 DIAGNOSIS — E785 Hyperlipidemia, unspecified: Secondary | ICD-10-CM

## 2017-10-21 DIAGNOSIS — Z8042 Family history of malignant neoplasm of prostate: Secondary | ICD-10-CM | POA: Diagnosis not present

## 2017-10-21 DIAGNOSIS — Z880 Allergy status to penicillin: Secondary | ICD-10-CM | POA: Insufficient documentation

## 2017-10-21 DIAGNOSIS — Z808 Family history of malignant neoplasm of other organs or systems: Secondary | ICD-10-CM | POA: Insufficient documentation

## 2017-10-21 DIAGNOSIS — Z7189 Other specified counseling: Secondary | ICD-10-CM

## 2017-10-21 DIAGNOSIS — C786 Secondary malignant neoplasm of retroperitoneum and peritoneum: Secondary | ICD-10-CM | POA: Insufficient documentation

## 2017-10-21 DIAGNOSIS — T451X5S Adverse effect of antineoplastic and immunosuppressive drugs, sequela: Secondary | ICD-10-CM

## 2017-10-21 DIAGNOSIS — Z452 Encounter for adjustment and management of vascular access device: Secondary | ICD-10-CM | POA: Insufficient documentation

## 2017-10-21 DIAGNOSIS — Z95828 Presence of other vascular implants and grafts: Secondary | ICD-10-CM

## 2017-10-21 LAB — COMPREHENSIVE METABOLIC PANEL
ALK PHOS: 126 U/L (ref 40–150)
ALT: 50 U/L (ref 0–55)
AST: 30 U/L (ref 5–34)
Albumin: 4.1 g/dL (ref 3.5–5.0)
Anion gap: 9 (ref 3–11)
BUN: 11 mg/dL (ref 7–26)
CALCIUM: 9.5 mg/dL (ref 8.4–10.4)
CO2: 25 mmol/L (ref 22–29)
Chloride: 104 mmol/L (ref 98–109)
Creatinine, Ser: 0.87 mg/dL (ref 0.70–1.30)
GFR calc Af Amer: >60 mL/min (ref >60)
GFR calc non Af Amer: >60 mL/min (ref >60)
GLUCOSE: 254 mg/dL — AB (ref 70–140)
Potassium: 4.1 mmol/L (ref 3.5–5.1)
SODIUM: 138 mmol/L (ref 136–145)
Total Bilirubin: 1 mg/dL (ref 0.2–1.2)
Total Protein: 6.8 g/dL (ref 6.4–8.3)

## 2017-10-21 LAB — CEA (IN HOUSE-CHCC): CEA (CHCC-In House): 10.58 ng/mL — ABNORMAL HIGH (ref 0.00–5.00)

## 2017-10-21 LAB — CBC WITH DIFFERENTIAL/PLATELET
BASOS PCT: 0 %
Basophils Absolute: 0 10*3/uL (ref 0.0–0.1)
EOS ABS: 0.2 10*3/uL (ref 0.0–0.5)
EOS PCT: 3 %
HEMATOCRIT: 43.6 % (ref 38.4–49.9)
Hemoglobin: 14.8 g/dL (ref 13.0–17.1)
Lymphocytes Relative: 18 %
Lymphs Abs: 1 10*3/uL (ref 0.9–3.3)
MCH: 32 pg (ref 27.2–33.4)
MCHC: 33.9 g/dL (ref 32.0–36.0)
MCV: 94.4 fL (ref 79.3–98.0)
MONO ABS: 0.6 10*3/uL (ref 0.1–0.9)
MONOS PCT: 11 %
Neutro Abs: 3.6 10*3/uL (ref 1.5–6.5)
Neutrophils Relative %: 68 %
PLATELETS: 106 10*3/uL — AB (ref 140–400)
RBC: 4.62 MIL/uL (ref 4.20–5.82)
RDW: 13.9 % (ref 11.0–14.6)
WBC: 5.3 10*3/uL (ref 4.0–10.3)

## 2017-10-21 LAB — RETICULOCYTES
RBC.: 4.62 MIL/uL (ref 4.20–5.82)
RETIC CT PCT: 1.8 % (ref 0.8–1.8)
Retic Count, Absolute: 83.2 10*3/uL (ref 34.8–93.9)

## 2017-10-21 MED ORDER — PALONOSETRON HCL INJECTION 0.25 MG/5ML
INTRAVENOUS | Status: AC
  Filled 2017-10-21: qty 5

## 2017-10-21 MED ORDER — DEXTROSE 5 % IV SOLN: Freq: Once | INTRAVENOUS | Status: DC

## 2017-10-21 MED ORDER — SODIUM CHLORIDE 0.9 % IV SOLN
2400.0000 mg/m2 | INTRAVENOUS | Status: DC
  Administered 2017-10-21: 5800 mg via INTRAVENOUS
  Filled 2017-10-21: qty 116

## 2017-10-21 MED ORDER — PALONOSETRON HCL INJECTION 0.25 MG/5ML
0.2500 mg | Freq: Once | INTRAVENOUS | Status: AC
  Administered 2017-10-21: 0.25 mg via INTRAVENOUS

## 2017-10-21 MED ORDER — DEXAMETHASONE SODIUM PHOSPHATE 10 MG/ML IJ SOLN
INTRAMUSCULAR | Status: AC
  Filled 2017-10-21: qty 1

## 2017-10-21 MED ORDER — LEUCOVORIN CALCIUM INJECTION 350 MG
400.0000 mg/m2 | Freq: Once | INTRAMUSCULAR | Status: AC
  Administered 2017-10-21: 964 mg via INTRAVENOUS
  Filled 2017-10-21: qty 48.2

## 2017-10-21 MED ORDER — SODIUM CHLORIDE 0.9% FLUSH
10.0000 mL | INTRAVENOUS | Status: DC | PRN
  Administered 2017-10-21: 10 mL via INTRAVENOUS
  Filled 2017-10-21: qty 10

## 2017-10-21 MED ORDER — DEXAMETHASONE SODIUM PHOSPHATE 10 MG/ML IJ SOLN
10.0000 mg | Freq: Once | INTRAMUSCULAR | Status: AC
  Administered 2017-10-21: 10 mg via INTRAVENOUS

## 2017-10-21 NOTE — Patient Instructions (Signed)
[facility] Discharge Instructions for Patients Receiving Chemotherapy  Today you received the following chemotherapy agents Leucovorin and 5FU  To help prevent nausea and vomiting after your treatment, we encourage you to take your nausea medication as directed If you develop nausea and vomiting that is not controlled by your nausea medication, call the clinic.   BELOW ARE SYMPTOMS THAT SHOULD BE REPORTED IMMEDIATELY:  *FEVER GREATER THAN 100.5 F  *CHILLS WITH OR WITHOUT FEVER  NAUSEA AND VOMITING THAT IS NOT CONTROLLED WITH YOUR NAUSEA MEDICATION  *UNUSUAL SHORTNESS OF BREATH  *UNUSUAL BRUISING OR BLEEDING  TENDERNESS IN MOUTH AND THROAT WITH OR WITHOUT PRESENCE OF ULCERS  *URINARY PROBLEMS  *BOWEL PROBLEMS  UNUSUAL RASH Items with * indicate a potential emergency and should be followed up as soon as possible.  Feel free to call the clinic should you have any questions or concerns. The clinic phone number is [phone].  Please show the CHEMO ALERT CARD at check-in to the Emergency Department and triage nurse.   

## 2017-10-21 NOTE — Telephone Encounter (Signed)
Appointments scheduled AVS/Calendar printed per 3/7 los °

## 2017-10-22 ENCOUNTER — Other Ambulatory Visit: Payer: Self-pay | Admitting: Hematology

## 2017-10-22 ENCOUNTER — Other Ambulatory Visit: Payer: Self-pay | Admitting: Medical Oncology

## 2017-10-22 DIAGNOSIS — C189 Malignant neoplasm of colon, unspecified: Secondary | ICD-10-CM

## 2017-10-22 DIAGNOSIS — C787 Secondary malignant neoplasm of liver and intrahepatic bile duct: Principal | ICD-10-CM

## 2017-10-23 ENCOUNTER — Inpatient Hospital Stay: Payer: 59

## 2017-10-23 VITALS — BP 134/74 | HR 72 | Temp 98.4°F | Resp 18

## 2017-10-23 DIAGNOSIS — C787 Secondary malignant neoplasm of liver and intrahepatic bile duct: Secondary | ICD-10-CM

## 2017-10-23 DIAGNOSIS — Z7189 Other specified counseling: Secondary | ICD-10-CM

## 2017-10-23 DIAGNOSIS — C182 Malignant neoplasm of ascending colon: Secondary | ICD-10-CM | POA: Diagnosis not present

## 2017-10-23 DIAGNOSIS — C189 Malignant neoplasm of colon, unspecified: Secondary | ICD-10-CM

## 2017-10-23 MED ORDER — SODIUM CHLORIDE 0.9% FLUSH
10.0000 mL | INTRAVENOUS | Status: DC | PRN
  Administered 2017-10-23: 10 mL
  Filled 2017-10-23: qty 10

## 2017-10-23 MED ORDER — HEPARIN SOD (PORK) LOCK FLUSH 100 UNIT/ML IV SOLN
500.0000 [IU] | Freq: Once | INTRAVENOUS | Status: AC | PRN
  Administered 2017-10-23: 500 [IU]
  Filled 2017-10-23: qty 5

## 2017-10-23 NOTE — Patient Instructions (Signed)
[facility] Discharge Instructions for Patients Receiving Chemotherapy  Today you received the following chemotherapy agents 5fu To help prevent nausea and vomiting after your treatment, we encourage you to take your nausea medication as prescribed.  If you develop nausea and vomiting that is not controlled by your nausea medication, call the clinic.   BELOW ARE SYMPTOMS THAT SHOULD BE REPORTED IMMEDIATELY:  *FEVER GREATER THAN 100.5 F  *CHILLS WITH OR WITHOUT FEVER  NAUSEA AND VOMITING THAT IS NOT CONTROLLED WITH YOUR NAUSEA MEDICATION  *UNUSUAL SHORTNESS OF BREATH  *UNUSUAL BRUISING OR BLEEDING  TENDERNESS IN MOUTH AND THROAT WITH OR WITHOUT PRESENCE OF ULCERS  *URINARY PROBLEMS  *BOWEL PROBLEMS  UNUSUAL RASH Items with * indicate a potential emergency and should be followed up as soon as possible.  Feel free to call the clinic should you have any questions or concerns. The clinic phone number is [phone].  Please show the CHEMO ALERT CARD at check-in to the Emergency Department and triage nurse.   

## 2017-10-25 DIAGNOSIS — G4733 Obstructive sleep apnea (adult) (pediatric): Secondary | ICD-10-CM | POA: Diagnosis not present

## 2017-11-02 ENCOUNTER — Other Ambulatory Visit: Payer: Self-pay | Admitting: Hematology

## 2017-11-04 ENCOUNTER — Inpatient Hospital Stay: Payer: 59

## 2017-11-04 VITALS — BP 125/86 | HR 74 | Temp 97.9°F | Resp 17 | Wt 251.8 lb

## 2017-11-04 DIAGNOSIS — C787 Secondary malignant neoplasm of liver and intrahepatic bile duct: Principal | ICD-10-CM

## 2017-11-04 DIAGNOSIS — Z7189 Other specified counseling: Secondary | ICD-10-CM

## 2017-11-04 DIAGNOSIS — C189 Malignant neoplasm of colon, unspecified: Secondary | ICD-10-CM

## 2017-11-04 DIAGNOSIS — C182 Malignant neoplasm of ascending colon: Secondary | ICD-10-CM | POA: Diagnosis not present

## 2017-11-04 DIAGNOSIS — Z95828 Presence of other vascular implants and grafts: Secondary | ICD-10-CM

## 2017-11-04 DIAGNOSIS — D500 Iron deficiency anemia secondary to blood loss (chronic): Secondary | ICD-10-CM

## 2017-11-04 LAB — CMP (CANCER CENTER ONLY)
ALT: 58 U/L — AB (ref 0–55)
ANION GAP: 10 (ref 3–11)
AST: 41 U/L — ABNORMAL HIGH (ref 5–34)
Albumin: 4 g/dL (ref 3.5–5.0)
Alkaline Phosphatase: 127 U/L (ref 40–150)
BUN: 10 mg/dL (ref 7–26)
CHLORIDE: 106 mmol/L (ref 98–109)
CO2: 24 mmol/L (ref 22–29)
Calcium: 9.8 mg/dL (ref 8.4–10.4)
Creatinine: 0.91 mg/dL (ref 0.70–1.30)
Glucose, Bld: 261 mg/dL — ABNORMAL HIGH (ref 70–140)
POTASSIUM: 4.4 mmol/L (ref 3.5–5.1)
SODIUM: 140 mmol/L (ref 136–145)
Total Bilirubin: 0.6 mg/dL (ref 0.2–1.2)
Total Protein: 6.7 g/dL (ref 6.4–8.3)

## 2017-11-04 LAB — CBC WITH DIFFERENTIAL (CANCER CENTER ONLY)
Basophils Absolute: 0 10*3/uL (ref 0.0–0.1)
Basophils Relative: 1 %
EOS ABS: 0.2 10*3/uL (ref 0.0–0.5)
Eosinophils Relative: 4 %
HEMATOCRIT: 43.6 % (ref 38.4–49.9)
HEMOGLOBIN: 14.5 g/dL (ref 13.0–17.1)
LYMPHS ABS: 0.8 10*3/uL — AB (ref 0.9–3.3)
LYMPHS PCT: 17 %
MCH: 31 pg (ref 27.2–33.4)
MCHC: 33.3 g/dL (ref 32.0–36.0)
MCV: 93.1 fL (ref 79.3–98.0)
MONOS PCT: 12 %
Monocytes Absolute: 0.6 10*3/uL (ref 0.1–0.9)
NEUTROS ABS: 3.3 10*3/uL (ref 1.5–6.5)
NEUTROS PCT: 66 %
Platelet Count: 118 10*3/uL — ABNORMAL LOW (ref 140–400)
RBC: 4.68 MIL/uL (ref 4.20–5.82)
RDW: 14.4 % (ref 11.0–14.6)
WBC: 4.9 10*3/uL (ref 4.0–10.3)

## 2017-11-04 MED ORDER — SODIUM CHLORIDE 0.9 % IV SOLN
400.0000 mg/m2 | Freq: Once | INTRAVENOUS | Status: AC
  Administered 2017-11-04: 964 mg via INTRAVENOUS
  Filled 2017-11-04: qty 48.2

## 2017-11-04 MED ORDER — SODIUM CHLORIDE 0.9% FLUSH
10.0000 mL | INTRAVENOUS | Status: DC | PRN
  Administered 2017-11-04: 10 mL via INTRAVENOUS
  Filled 2017-11-04: qty 10

## 2017-11-04 MED ORDER — DEXAMETHASONE SODIUM PHOSPHATE 10 MG/ML IJ SOLN
INTRAMUSCULAR | Status: AC
  Filled 2017-11-04: qty 1

## 2017-11-04 MED ORDER — PALONOSETRON HCL INJECTION 0.25 MG/5ML
INTRAVENOUS | Status: AC
  Filled 2017-11-04: qty 5

## 2017-11-04 MED ORDER — PALONOSETRON HCL INJECTION 0.25 MG/5ML
0.2500 mg | Freq: Once | INTRAVENOUS | Status: AC
  Administered 2017-11-04: 0.25 mg via INTRAVENOUS

## 2017-11-04 MED ORDER — DEXAMETHASONE SODIUM PHOSPHATE 10 MG/ML IJ SOLN
10.0000 mg | Freq: Once | INTRAMUSCULAR | Status: AC
  Administered 2017-11-04: 10 mg via INTRAVENOUS

## 2017-11-04 MED ORDER — SODIUM CHLORIDE 0.9 % IV SOLN
2400.0000 mg/m2 | INTRAVENOUS | Status: DC
  Administered 2017-11-04: 5800 mg via INTRAVENOUS
  Filled 2017-11-04: qty 116

## 2017-11-04 NOTE — Patient Instructions (Signed)
Implanted Port Home Guide An implanted port is a type of central line that is placed under the skin. Central lines are used to provide IV access when treatment or nutrition needs to be given through a person's veins. Implanted ports are used for long-term IV access. An implanted port may be placed because:  You need IV medicine that would be irritating to the small veins in your hands or arms.  You need long-term IV medicines, such as antibiotics.  You need IV nutrition for a long period.  You need frequent blood draws for lab tests.  You need dialysis.  Implanted ports are usually placed in the chest area, but they can also be placed in the upper arm, the abdomen, or the leg. An implanted port has two main parts:  Reservoir. The reservoir is round and will appear as a small, raised area under your skin. The reservoir is the part where a needle is inserted to give medicines or draw blood.  Catheter. The catheter is a thin, flexible tube that extends from the reservoir. The catheter is placed into a large vein. Medicine that is inserted into the reservoir goes into the catheter and then into the vein.  How will I care for my incision site? Do not get the incision site wet. Bathe or shower as directed by your health care provider. How is my port accessed? Special steps must be taken to access the port:  Before the port is accessed, a numbing cream can be placed on the skin. This helps numb the skin over the port site.  Your health care provider uses a sterile technique to access the port. ? Your health care provider must put on a mask and sterile gloves. ? The skin over your port is cleaned carefully with an antiseptic and allowed to dry. ? The port is gently pinched between sterile gloves, and a needle is inserted into the port.  Only "non-coring" port needles should be used to access the port. Once the port is accessed, a blood return should be checked. This helps ensure that the port  is in the vein and is not clogged.  If your port needs to remain accessed for a constant infusion, a clear (transparent) bandage will be placed over the needle site. The bandage and needle will need to be changed every week, or as directed by your health care provider.  Keep the bandage covering the needle clean and dry. Do not get it wet. Follow your health care provider's instructions on how to take a shower or bath while the port is accessed.  If your port does not need to stay accessed, no bandage is needed over the port.  What is flushing? Flushing helps keep the port from getting clogged. Follow your health care provider's instructions on how and when to flush the port. Ports are usually flushed with saline solution or a medicine called heparin. The need for flushing will depend on how the port is used.  If the port is used for intermittent medicines or blood draws, the port will need to be flushed: ? After medicines have been given. ? After blood has been drawn. ? As part of routine maintenance.  If a constant infusion is running, the port may not need to be flushed.  How long will my port stay implanted? The port can stay in for as long as your health care provider thinks it is needed. When it is time for the port to come out, surgery will be   done to remove it. The procedure is similar to the one performed when the port was put in. When should I seek immediate medical care? When you have an implanted port, you should seek immediate medical care if:  You notice a bad smell coming from the incision site.  You have swelling, redness, or drainage at the incision site.  You have more swelling or pain at the port site or the surrounding area.  You have a fever that is not controlled with medicine.  This information is not intended to replace advice given to you by your health care provider. Make sure you discuss any questions you have with your health care provider. Document  Released: 08/03/2005 Document Revised: 01/09/2016 Document Reviewed: 04/10/2013 Elsevier Interactive Patient Education  2017 [employer].  

## 2017-11-04 NOTE — Patient Instructions (Signed)
[facility] Discharge Instructions for Patients Receiving Chemotherapy  Today you received the following chemotherapy agents Leucovorin, Fluorouracil.  To help prevent nausea and vomiting after your treatment, we encourage you to take your nausea medication as prescribed.  If you develop nausea and vomiting that is not controlled by your nausea medication, call the clinic.   BELOW ARE SYMPTOMS THAT SHOULD BE REPORTED IMMEDIATELY:  *FEVER GREATER THAN 100.5 F  *CHILLS WITH OR WITHOUT FEVER  NAUSEA AND VOMITING THAT IS NOT CONTROLLED WITH YOUR NAUSEA MEDICATION  *UNUSUAL SHORTNESS OF BREATH  *UNUSUAL BRUISING OR BLEEDING  TENDERNESS IN MOUTH AND THROAT WITH OR WITHOUT PRESENCE OF ULCERS  *URINARY PROBLEMS  *BOWEL PROBLEMS  UNUSUAL RASH Items with * indicate a potential emergency and should be followed up as soon as possible.  Feel free to call the clinic should you have any questions or concerns. The clinic phone number is [phone].  Please show the CHEMO ALERT CARD at check-in to the Emergency Department and triage nurse.   

## 2017-11-05 ENCOUNTER — Telehealth: Payer: Self-pay | Admitting: Hematology

## 2017-11-05 NOTE — Telephone Encounter (Signed)
Poke with patient regarding appointment time change per 3/21 sch msg

## 2017-11-06 ENCOUNTER — Other Ambulatory Visit: Payer: Self-pay | Admitting: Hematology

## 2017-11-06 ENCOUNTER — Inpatient Hospital Stay: Payer: 59

## 2017-11-06 VITALS — BP 129/74 | HR 81 | Temp 98.5°F | Resp 18

## 2017-11-06 DIAGNOSIS — C182 Malignant neoplasm of ascending colon: Secondary | ICD-10-CM | POA: Diagnosis not present

## 2017-11-06 DIAGNOSIS — C189 Malignant neoplasm of colon, unspecified: Secondary | ICD-10-CM

## 2017-11-06 DIAGNOSIS — C787 Secondary malignant neoplasm of liver and intrahepatic bile duct: Principal | ICD-10-CM

## 2017-11-06 DIAGNOSIS — Z7189 Other specified counseling: Secondary | ICD-10-CM

## 2017-11-06 MED ORDER — SODIUM CHLORIDE 0.9% FLUSH
10.0000 mL | INTRAVENOUS | Status: DC | PRN
  Administered 2017-11-06: 10 mL
  Filled 2017-11-06: qty 10

## 2017-11-06 MED ORDER — HEPARIN SOD (PORK) LOCK FLUSH 100 UNIT/ML IV SOLN
500.0000 [IU] | Freq: Once | INTRAVENOUS | Status: AC | PRN
  Administered 2017-11-06: 500 [IU]
  Filled 2017-11-06: qty 5

## 2017-11-11 ENCOUNTER — Other Ambulatory Visit: Payer: Self-pay | Admitting: Hematology

## 2017-11-11 DIAGNOSIS — C787 Secondary malignant neoplasm of liver and intrahepatic bile duct: Principal | ICD-10-CM

## 2017-11-11 DIAGNOSIS — C189 Malignant neoplasm of colon, unspecified: Secondary | ICD-10-CM

## 2017-11-12 ENCOUNTER — Telehealth: Payer: Self-pay

## 2017-11-12 NOTE — Telephone Encounter (Signed)
Refill request received from pt pharmacy for decadron. Recently refilled on 10/22/17. Called pt to determine need for refill as 22 pills should still be remaining. Pt said he was not sure how many he had left, but that he should not need a refill at this time. Pt okay to call back when he runs out of steroid. Pt verbalized understanding.

## 2017-11-17 NOTE — Progress Notes (Signed)
Scott Gallagher    HEMATOLOGY/ONCOLOGY CLINIC NOTE  Date of Service: 11/18/17  Patient Care Team: [name], MD as PCP - General (Family Medicine) [name] M.D. 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 KRAS mutated BRAF, NRAS mutation neg MSI Stable.  PET/CT scan done on 06/25/2016 Show multiple foci of hypermetabolic hepatic metastases (atleast 4-5) and 2 hypermetabolic peritoneal nodules along the dorsal peritoneal surface concerning for peritoneal metastases.  MRI Liver 07/27/2016 - Multiple small liver metastases throughout the right and left hepatic lobes, largest measuring 2.1 cm. 1.2 cm enhancing peritoneal nodule in left paracolic gutter, suspicious for peritoneal metastasis. Other small peritoneal nodules better visualized on recent PET-CT which showed hypermetabolic activity, also suspicious for peritoneal metastases.   CT CHEST WO CONTRAST 10/12/2016  [facility] Result Impression   1. Congenital variant of bilateral middle lobes. 2. Groundglass opacification with regions of bronchiectasis in the right lower lobe adjacent to the fissure, consistent with inflammatory/infectious changes. 3. No definitive evidence of metastatic disease to the chest. 4. Probable sebaceous cyst in the subcutaneous tissues beneath the left anterior chest.   MR ABDOMEN AND PELVIS BJYNWGNF FOLLOW UP2/26/2018 [facility] Result Impression    Decreased size of multiple hepatic lesions and two peritoneal nodules compared to outside MRI 07/27/2016. No new lesions identified in the  abdomen or pelvis.     PREVIOUS TREATMENT  FOLFOX x 10 cycles. 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 (without 5FU bolus - due to thrombocytopenia). S/p Y-90 treatment to 1 hepatic lobe.  Plan for bilobar hepatic Y90 radio-embolization.  INTERVAL HISTORY   Scott Gallagher is here for follow-up for his metastatic colon cancer and for his next cycle of 5FU/leucovorin for maintenance therapy. The patient's last visit with [place] was on 10/21/17. He is accompanied today by his wife. The pt reports that he is doing well overall and is looking forward to a vacation next month.   The pt reports occasional abdominal pains that come and go. He notes that his bowel movements are not regular but denies constipation or diarrhea. He notes stable neuropathy.   He is up to 46 units of basal Insulin. He notes that he records his glucose once each day, in the morning. He notes that his glucose levels have been high and is going to call his PCP about his diabetes management later today.   He notes that he was recently exposed to poison ivy and presents today with several rashes on his extremities but notes that he has steroid cream and his rashes are resolving.   Lab results today (11/18/17) of CBC, CMP, and Reticulocytes is as follows: all values are WNL except for Platelets at 111k, Glucose at 222, Retic Ct Pct at 1.9%. CEA from 10/21/17 was increased to 10.58.  CEA today 11/18/17 gradually increased to 11.87 On review of systems, pt reports poison ivy rash, stable weight, intermittent abdominal discomfort, and denies mouth sores, fevers, chills, night sweats, current abdominal pain, leg swelling, and any other symptoms.  MEDICAL HISTORY:  Past Medical History:  Diagnosis Date  . Anemia   . Arthritis   . Cancer ([place])   . Diabetes mellitus without complication (HCC)      diet controlled  . History of blood transfusion   . Hyperlipidemia   . Sleep apnea    cpap  . Wears glasses     SURGICAL HISTORY: Past Surgical History:  Procedure Laterality Date  . COLON SURGERY    . IR GENERIC HISTORICAL  09/24/2016   IR CV LINE INJECTION 09/24/2016 [facility]  . IR GENERIC HISTORICAL  09/24/2016   IR US GUIDE VASC ACCESS RIGHT 09/24/2016 [facility]  . IR GENERIC HISTORICAL  09/24/2016   IR FLUORO GUIDE CV LINE RIGHT 09/24/2016 [facility]  . IR GENERIC HISTORICAL  09/30/2016   IR FLUORO GUIDE PORT INSERTION RIGHT 09/30/2016 Markus Daft, MD [facility]  . IR GENERIC HISTORICAL  09/30/2016   IR US GUIDE VASC ACCESS RIGHT 09/30/2016 Markus Daft, MD [facility]  . IR GENERIC HISTORICAL  09/30/2016   IR REMOVAL TUN ACCESS W/ PORT W/O FL MOD SED 09/30/2016 Markus Daft, MD [facility]  . LAPAROSCOPIC RIGHT HEMI COLECTOMY Right 06/17/2016   Procedure: LAPAROSCOPIC ASSISTED  RIGHT HEMI COLECTOMY;  Surgeon: [name], MD;  Location: [facility];  Service: General;  Laterality: Right;  . PORTACATH PLACEMENT Left 07/13/2016   Procedure: INSERTION PORT-A-CATH left subclavian;  Surgeon: [name], MD;  Location: [facility];  Service: General;  Laterality: Left;  . SHOULDER ACROMIOPLASTY Right 12/20/2014   Procedure: SHOULDER ACROMIOPLASTY;  Surgeon: Melrose Nakayama, MD;  Location: [facility];  Service: Orthopedics;  Laterality: Right;  . SHOULDER ARTHROSCOPY Right 12/20/2014   Procedure: RIGHT ARTHROSCOPY SHOULDER WITH DEBRIDEMENT;  Surgeon: Melrose Nakayama, MD;  Location: [facility];  Service: Orthopedics;  Laterality: Right;  . TESTICLE SURGERY     as teen  . TONSILLECTOMY    . WISDOM TOOTH EXTRACTION      SOCIAL HISTORY: Social History   Socioeconomic History  . Marital status: Married    Spouse name: Not on file  . Number of children: Not on file  . Years of education: Not on file  . Highest education level: Not on file  Occupational History   . Occupation: cemetery maintenance  Social Needs  . Financial resource strain: Not on file  . Food insecurity:    Worry: Not on file    Inability: Not on file  . Transportation needs:    Medical: Not on file    Non-medical: Not on file  Tobacco Use  . Smoking status: Former Smoker    Packs/day: 1.50    Years: 29.00    Pack years: 43.50    Types: Cigarettes    Last attempt to quit: 12/16/2012    Years since quitting: 4.9  . Smokeless tobacco: Never Used  . Tobacco comment: 1-2 ppd from age [age]y to 2014  Substance and Sexual Activity  . Alcohol use: Yes    Alcohol/week: 1.8 oz    Types: 3 Shots of liquor per week    Comment:  some days  . Drug use: No  . Sexual activity: Not on file  Lifestyle  . Physical activity:    Days per week: Not on file    Minutes per session: Not on file  . Stress: Not on file  Relationships  . Social connections:    Talks on phone: Not on file    Gets together: Not  on file    Attends religious service: Not on file    Active member of club or organization: Not on file    Attends meetings of clubs or organizations: Not on file    Relationship status: Not on file  . Intimate partner violence:    Fear of current or ex partner: Not on file    Emotionally abused: Not on file    Physically abused: Not on file    Forced sexual activity: Not on file  Other Topics Concern  . Not on file  Social History Narrative  . Not on file    FAMILY HISTORY: Family History  Problem Relation Age of Onset  . Diabetes Other   . Hyperlipidemia Other   . Hypertension Other   . Breast cancer Maternal Aunt 70  . Prostate cancer Maternal Uncle 75  . Lung cancer Paternal Uncle 53  . Stroke Maternal Grandfather 72  . Cancer Paternal Grandmother 57       dx cancer of pancreas and colon, unknown if separate primaries  . Heart Problems Paternal Grandfather        d. 49  . Prostate cancer Maternal Uncle 63  . Breast cancer Maternal Aunt 75  . Breast cancer  Maternal Aunt 68  . Cervical cancer Maternal Aunt 68  . Pancreatic cancer Maternal Aunt 54       d. 58y; heavy smoker  . Colon cancer Paternal Uncle 54       s/p partial colectomy; dx. second colon cancer at age 18-64    ALLERGIES:  is allergic to penicillins.  MEDICATIONS:  Current Outpatient Medications  Medication Sig Dispense Refill  . dexamethasone (DECADRON) 4 MG tablet TAKE 2 TABLETS BY MOUTH WITH FOOD DAILY (START THE DAY AFTER CHEMOTHERAPY FOR 2 DAYS) 30 tablet 0  . DULoxetine (CYMBALTA) 60 MG capsule TAKE 1 CAPSULE BY MOUTH EVERY DAY 30 capsule 1  . Insulin Glargine (BASAGLAR KWIKPEN) 100 UNIT/ML SOPN Inject 46 Units into the skin every morning.    . lidocaine-prilocaine (EMLA) cream Apply to affected area once 30 g 3  . Multiple Vitamin (MULTIVITAMIN) tablet Take 1 tablet by mouth daily.    . ondansetron (ZOFRAN) 8 MG tablet Take 1 tablet (8 mg total) by mouth 2 (two) times daily as needed for refractory nausea / vomiting. Start on day 3 after chemotherapy. 30 tablet 1  . prochlorperazine (COMPAZINE) 10 MG tablet TAKE 1 TABLET BY MOUTH EVERY 6 HOURS AS NEEDED FOR NAUSEA / VOMITING 30 tablet 1  . traMADol (ULTRAM) 50 MG tablet TAKE 1 TABLET BY MOUTH EVERY 6 HOURS AS NEEDED FOR MODERATE / SEVERE PAIN 60 tablet 0   No current facility-administered medications for this visit.     REVIEW OF SYSTEMS:    10 Point review of Systems was done is negative except as noted above.    PHYSICAL EXAMINATION:  ECOG PERFORMANCE STATUS: 1 - Symptomatic but completely ambulatory  GENERAL:alert, in no acute distress and comfortable SKIN: no acute rashes, no significant lesions EYES: conjunctiva are pink and non-injected, sclera anicteric OROPHARYNX: MMM, no exudates, no oropharyngeal erythema or ulceration NECK: supple, no JVD LYMPH:  no palpable lymphadenopathy in the cervical, axillary or inguinal regions LUNGS: clear to auscultation b/l with normal respiratory effort HEART: regular  rate & rhythm ABDOMEN:  normoactive bowel sounds , non tender, not distended. Extremity: no pedal edema PSYCH: alert & oriented x 3 with fluent speech NEURO: no focal motor/sensory deficits    LABORATORY DATA:  I have reviewed the data as listed  .Scott Gallagher CBC Latest Ref Rng & Units 11/18/2017 11/04/2017 10/21/2017  WBC 4.0 - 10.3 K/uL 5.3 4.9 5.3  Hemoglobin 13.0 - 17.1 g/dL - - 14.8  Hematocrit 38.4 - 49.9 % 42.1 43.6 43.6  Platelets 140 - 400 K/uL 111(L) 118(L) 106(L)  HGB 14.8 . CMP Latest Ref Rng & Units 11/18/2017 11/04/2017 10/21/2017  Glucose 70 - 140 mg/dL 222(H) 261(H) 254(H)  BUN 7 - 26 mg/dL 9 10 11   Creatinine 0.70 - 1.30 mg/dL 0.82 0.91 0.87  Sodium 136 - 145 mmol/L 138 140 138  Potassium 3.5 - 5.1 mmol/L 4.3 4.4 4.1  Chloride 98 - 109 mmol/L 105 106 104  CO2 22 - 29 mmol/L 25 24 25   Calcium 8.4 - 10.4 mg/dL 9.4 9.8 9.5  Total Protein 6.4 - 8.3 g/dL 6.4 6.7 6.8  Total Bilirubin 0.2 - 1.2 mg/dL 0.9 0.6 1.0  Alkaline Phos 40 - 150 U/L 111 127 126  AST 5 - 34 U/L 30 41(H) 30  ALT 0 - 55 U/L 46 58(H) 50   .       Microscopic Comment 2. COLON AND RECTUM (INCLUDING TRANS-ANAL RESECTION): Specimen: Terminal ileum, right colon and appendix Procedure: Segmental resection Tumor site: Proximal ascending colon Specimen integrity: Intact Macroscopic intactness of mesorectum: Not applicable: x Complete: NA Near complete: NA Incomplete: NA Cannot be determined (specify): NA Macroscopic tumor perforation: The mass invades through muscularis propria into pericolonic soft tissue Invasive tumor: Maximum size: 5.5 cm Histologic type(s): Adenocarcinoma Histologic grade and differentiation: G3 G1: well differentiated/low grade 1 of 4 Supplemental copy SUPPLEMENTAL for Graffam, Dane ([id number]) Microscopic Comment(continued) G2: moderately differentiated/low grade G3: poorly differentiated/high grade G4: undifferentiated/high grade Type of polyp in which invasive carcinoma  arose: Tubular adenoma Microscopic extension of invasive tumor: The mass invade through the muscularis propria into pericolonic soft tissue Lymph-Vascular invasion: Identified Peri-neural invasion: Identified Tumor deposit(s) (discontinuous extramural extension): Present Resection margins: Proximal margin: Negative Distal margin: Negative Circumferential (radial) (posterior ascending, posterior descending; lateral and posterior mid-rectum; and entire lower 1/3 rectum):Negative Mesenteric margin (sigmoid and transverse): NA Distance closest margin (if all above margins negative): 3.7 cm from the non peritonealized pericolic soft tissue margin Trans-anal resection margins only: Deep margin: NA Mucosal Margin: NA Distance closest mucosal margin (if negative): NA Treatment effect (neo-adjuvant therapy): NA Additional polyp(s): Negative Non-neoplastic findings: Unremarkable Lymph nodes: number examined 18; number positive: 5 Pathologic Staging: pT3, N2a, M1a Ancillary studies: MSI ordered      RADIOGRAPHIC STUDIES: I have personally reviewed the radiological images as listed and agreed with the findings in the report.  MR LIVER WWO CONTRAST2/14/2019 [facility] Result Impression   1.Decreased sizes of multiple T2 hyperintense metastatic lesions (in the setting of patient's known colon adenocarcinoma with mucinous features) in both hepatic lobes (the majority of which are tiny). 2.No new lesions. 3.Posttreatment changes within the right hepatic lobe. 4.Other findings, as detailed above.  Result Narrative  MR LIVER WITH AND WITHOUT CONTRAST, 09/30/2017 10:20 AM  INDICATION: liver malignancy \ liver malignancy \ C18.9 Metastatic colon cancer to liver (HCC) \ C78.7 Metastatic colon cancer to liver ([place])  ADDITIONAL HISTORY: None. COMPARISON: MRI abdomen dated 07/01/2017.  TECHNIQUE: Multiplanar, multisequence MR images of the upper abdomen were  obtained before and after intravenous administration of gadolinium-based contrast.   FINDINGS:  LOWER CHEST Heart: Normal size. No pericardial effusion. Lungs/pleura: No masses, consolidations, or effusions.  ABDOMEN Liver: Changes of prior radioembolization  in the right hepatic lobe. Perfusional anomaly in the posterior right hepatic lobe, likely a venovenous shunt. Decreased sizes of multiple T2 hyperintense metastatic lesions throughout both hepatic lobes (the majority of which are tiny and are marked in PACS on series 5), with the dominant lesion measuring 10 mm in segment 8 (series 5, image 19), versus 12 mm previously. No new lesions. Gallbladder: Normal. No stones or inflammatory changes. Biliary: No obstruction. Spleen: Splenomegaly, with the spleen measuring 15.2 cm in span. Pancreas: Normal. Adrenals: Normal. Kidneys: No masses, stones, or obstruction. Unchanged fluid signal lesions in both kidneys, measuring up to 2.0 cm in the interpolar region of the left kidney. Stomach/bowel: Unremarkable.    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 KRAS mutated BRAF, NRAS mutation neg MSI Stable.  PET/CT scan done on 06/25/2016 Show multiple foci of hypermetabolic hepatic metastases (atleast 4-5) and 2 hypermetabolic peritoneal nodules along the dorsal peritoneal surface concerning for peritoneal metastases.  MRI Liver 07/27/2016 - Multiple small liver metastases throughout the right and left hepatic lobes, largest measuring 2.1 cm. 1.2 cm enhancing peritoneal nodule in left paracolic gutter, suspicious for peritoneal metastasis. Other small peritoneal nodules better visualized on recent PET-CT which showed hypermetabolic activity, also suspicious for peritoneal metastases.   MRI Abd  10/12/2016 at [facility] --shows improvement in all the liver and the 2 peritoneal lesions and no new lesions.  MRI liver and CT C/A/P on 01/01/2017 as noted above showed improved/stable disease.   MRI Liver 07/01/2017 at [facility] -  1.Three T2 hyperintense metastatic lesions are seen in the liver, with the largest again in segment 8. These appear unchanged from prior, again without appreciable enhancement or restricted diffusion. Several of the previously noted tiny lesions are no longer visualized. No new lesions identified.  2.Treatment related changes in the posterior right hepatic lobe. 3.A peritoneal nodule adjacent to the splenic flexure is now more conspicuous compared to the prior study but appears similar to more remote imaging and again could relate to neoplastic disease, although is indeterminate.  CT Abd/pelvis 08/23/2017: Interval resolution of previously seen small right hepatic lobe lesion. Stable tiny sub-cm nodule along the capsular surface of the right hepatic lobe. No new or progressive disease identified. Stable hepatic steatosis with areas of fatty sparing.  MRI 09/30/17:  Decreased sizes of multiple T2 hyperintense metastatic lesions (in the setting of patient's known colon adenocarcinoma with mucinous features) in both hepatic lobes (the majority of which are tiny). No new lesions. Posttreatment changes within the right hepatic lobe. Other findings, as detailed above.   2) Mild thrombocytopenia PLT improved from 84k to 101k (already have held 5FU bolus dose).   PLAN  -No prohibitive toxicities of his maintenance 5FU/leucovorin treatment at this time and he shall continue this. -Discussed pt labwork today 11/18/17; blood counts and chemistries are stable. -In light of last available CEA increasing to 10.58-->11.87, we will repeat CT scan of C/A/P.  -Recommend taking other glucose records in the day, in conversation with his PCP to optimize DM2 management.  -No prohibitive  toxicities from his maintenance 5FU/leucovorin and he will continue as planned. -if CT shows liver based progression -- might need additonal liver directed intervention by Dr Pecola Lawless -if peritoneal disease progression ?role of RT vs metastatectomy   4) Iron deficiency anemia due to GI bleeding from  the tumor and some blood loss with surgery. 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 Counseling regarding advanced care planning and goals of care 05/27/2017  . 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 Genetic testing 10/30/2016  . Port catheter in place 09/25/2016  . Family history of colon cancer 07/24/2016  . Family history of prostate cancer 07/24/2016  . Metastatic colon cancer to liver ([place]) 07/01/2016  . Right Colon cancer metastasized to liver ([place]) 06/17/2016  . Chronic GI bleeding 05/29/2016  . Diabetes mellitus type 2, diet-controlled ([place]) 05/22/2016  . OSA (obstructive sleep apnea) 05/22/2016  . Hyperlipidemia 05/22/2016  . Anemia due to blood loss, chronic   . Antineoplastic chemotherapy induced pancytopenia (CODE) ([place]) 05/21/2016   Plan:  -Continue follow-up with primary care physician [name], MD for optimization of diabetes management - now on basal insulin with improving control.  -Continue use of CPAP for sleep apnea.  -Continue 5FU/LV maintenance q2weeks - plz schedule next 4 doses -labs q2weeks -CT chest/abd/pelvis in 3 weeks -RTC with Dr Irene Limbo in 4 weeks with labs and CT     All of the patients questions were answered with apparent satisfaction. The patient knows to call the clinic with any problems, questions or concerns.  . The total time spent in the appointment was 20 minutes and more than 50% was on counseling and direct patient cares.    Sullivan Lone MD [place] AAHIVMS [facility] Hematology/Oncology Physician [place]  (Office):        [phone] (Work cell):  [phone] (Fax):           [phone]  This document serves as a record of services personally performed by Sullivan Lone, MD. It was created on his behalf by Baldwin [place], a trained medical scribe. The creation of this record is based on the scribe's personal observations and the provider's statements to them.   .I have reviewed the above documentation for accuracy and completeness, and I agree with the above. Brunetta Genera MD MS

## 2017-11-18 ENCOUNTER — Telehealth: Payer: Self-pay | Admitting: Hematology

## 2017-11-18 ENCOUNTER — Inpatient Hospital Stay (HOSPITAL_BASED_OUTPATIENT_CLINIC_OR_DEPARTMENT_OTHER): Payer: 59 | Admitting: Hematology

## 2017-11-18 ENCOUNTER — Inpatient Hospital Stay: Payer: 59

## 2017-11-18 ENCOUNTER — Encounter: Payer: Self-pay | Admitting: Hematology

## 2017-11-18 ENCOUNTER — Inpatient Hospital Stay: Payer: 59 | Attending: Hematology

## 2017-11-18 VITALS — BP 118/78 | HR 77 | Temp 98.1°F | Resp 20 | Ht 74.0 in | Wt 261.0 lb

## 2017-11-18 DIAGNOSIS — Z79899 Other long term (current) drug therapy: Secondary | ICD-10-CM | POA: Insufficient documentation

## 2017-11-18 DIAGNOSIS — M1990 Unspecified osteoarthritis, unspecified site: Secondary | ICD-10-CM

## 2017-11-18 DIAGNOSIS — C182 Malignant neoplasm of ascending colon: Secondary | ICD-10-CM | POA: Insufficient documentation

## 2017-11-18 DIAGNOSIS — G4733 Obstructive sleep apnea (adult) (pediatric): Secondary | ICD-10-CM | POA: Insufficient documentation

## 2017-11-18 DIAGNOSIS — E1165 Type 2 diabetes mellitus with hyperglycemia: Secondary | ICD-10-CM | POA: Insufficient documentation

## 2017-11-18 DIAGNOSIS — C787 Secondary malignant neoplasm of liver and intrahepatic bile duct: Secondary | ICD-10-CM | POA: Insufficient documentation

## 2017-11-18 DIAGNOSIS — Z800 Family history of malignant neoplasm of digestive organs: Secondary | ICD-10-CM

## 2017-11-18 DIAGNOSIS — Z87891 Personal history of nicotine dependence: Secondary | ICD-10-CM

## 2017-11-18 DIAGNOSIS — Z9221 Personal history of antineoplastic chemotherapy: Secondary | ICD-10-CM | POA: Insufficient documentation

## 2017-11-18 DIAGNOSIS — Z880 Allergy status to penicillin: Secondary | ICD-10-CM | POA: Diagnosis not present

## 2017-11-18 DIAGNOSIS — Z95828 Presence of other vascular implants and grafts: Secondary | ICD-10-CM

## 2017-11-18 DIAGNOSIS — D696 Thrombocytopenia, unspecified: Secondary | ICD-10-CM

## 2017-11-18 DIAGNOSIS — Z803 Family history of malignant neoplasm of breast: Secondary | ICD-10-CM

## 2017-11-18 DIAGNOSIS — M5030 Other cervical disc degeneration, unspecified cervical region: Secondary | ICD-10-CM | POA: Diagnosis not present

## 2017-11-18 DIAGNOSIS — C189 Malignant neoplasm of colon, unspecified: Secondary | ICD-10-CM

## 2017-11-18 DIAGNOSIS — L237 Allergic contact dermatitis due to plants, except food: Secondary | ICD-10-CM | POA: Insufficient documentation

## 2017-11-18 DIAGNOSIS — D6959 Other secondary thrombocytopenia: Secondary | ICD-10-CM | POA: Insufficient documentation

## 2017-11-18 DIAGNOSIS — Z9989 Dependence on other enabling machines and devices: Secondary | ICD-10-CM | POA: Diagnosis not present

## 2017-11-18 DIAGNOSIS — Z794 Long term (current) use of insulin: Secondary | ICD-10-CM

## 2017-11-18 DIAGNOSIS — E785 Hyperlipidemia, unspecified: Secondary | ICD-10-CM

## 2017-11-18 DIAGNOSIS — C786 Secondary malignant neoplasm of retroperitoneum and peritoneum: Secondary | ICD-10-CM | POA: Diagnosis not present

## 2017-11-18 DIAGNOSIS — R109 Unspecified abdominal pain: Secondary | ICD-10-CM | POA: Diagnosis not present

## 2017-11-18 DIAGNOSIS — D500 Iron deficiency anemia secondary to blood loss (chronic): Secondary | ICD-10-CM | POA: Diagnosis not present

## 2017-11-18 DIAGNOSIS — Z7189 Other specified counseling: Secondary | ICD-10-CM

## 2017-11-18 DIAGNOSIS — T451X5S Adverse effect of antineoplastic and immunosuppressive drugs, sequela: Secondary | ICD-10-CM | POA: Insufficient documentation

## 2017-11-18 DIAGNOSIS — Z5111 Encounter for antineoplastic chemotherapy: Secondary | ICD-10-CM | POA: Diagnosis not present

## 2017-11-18 LAB — CBC WITH DIFFERENTIAL (CANCER CENTER ONLY)
BASOS ABS: 0 10*3/uL (ref 0.0–0.1)
BASOS PCT: 1 %
EOS PCT: 5 %
Eosinophils Absolute: 0.3 10*3/uL (ref 0.0–0.5)
HEMATOCRIT: 42.1 % (ref 38.4–49.9)
Hemoglobin: 14.1 g/dL (ref 13.0–17.1)
LYMPHS PCT: 18 %
Lymphs Abs: 0.9 10*3/uL (ref 0.9–3.3)
MCH: 31.6 pg (ref 27.2–33.4)
MCHC: 33.5 g/dL (ref 32.0–36.0)
MCV: 94.4 fL (ref 79.3–98.0)
Monocytes Absolute: 0.4 10*3/uL (ref 0.1–0.9)
Monocytes Relative: 8 %
NEUTROS ABS: 3.6 10*3/uL (ref 1.5–6.5)
Neutrophils Relative %: 68 %
PLATELETS: 111 10*3/uL — AB (ref 140–400)
RBC: 4.46 MIL/uL (ref 4.20–5.82)
RDW: 14.2 % (ref 11.0–14.6)
WBC: 5.3 10*3/uL (ref 4.0–10.3)

## 2017-11-18 LAB — CMP (CANCER CENTER ONLY)
ALK PHOS: 111 U/L (ref 40–150)
ALT: 46 U/L (ref 0–55)
ANION GAP: 8 (ref 3–11)
AST: 30 U/L (ref 5–34)
Albumin: 3.9 g/dL (ref 3.5–5.0)
BILIRUBIN TOTAL: 0.9 mg/dL (ref 0.2–1.2)
BUN: 9 mg/dL (ref 7–26)
CALCIUM: 9.4 mg/dL (ref 8.4–10.4)
CO2: 25 mmol/L (ref 22–29)
Chloride: 105 mmol/L (ref 98–109)
Creatinine: 0.82 mg/dL (ref 0.70–1.30)
GFR, Est AFR Am: >60 mL/min (ref >60)
Glucose, Bld: 222 mg/dL — ABNORMAL HIGH (ref 70–140)
POTASSIUM: 4.3 mmol/L (ref 3.5–5.1)
Sodium: 138 mmol/L (ref 136–145)
TOTAL PROTEIN: 6.4 g/dL (ref 6.4–8.3)

## 2017-11-18 LAB — RETICULOCYTES
RBC.: 4.46 MIL/uL (ref 4.20–5.82)
RETIC COUNT ABSOLUTE: 84.7 10*3/uL (ref 34.8–93.9)
Retic Ct Pct: 1.9 % — ABNORMAL HIGH (ref 0.8–1.8)

## 2017-11-18 LAB — CEA (IN HOUSE-CHCC): CEA (CHCC-IN HOUSE): 11.87 ng/mL — AB (ref 0.00–5.00)

## 2017-11-18 MED ORDER — SODIUM CHLORIDE 0.9 % IV SOLN
2400.0000 mg/m2 | INTRAVENOUS | Status: DC
  Administered 2017-11-18: 5800 mg via INTRAVENOUS
  Filled 2017-11-18: qty 116

## 2017-11-18 MED ORDER — PALONOSETRON HCL INJECTION 0.25 MG/5ML
0.2500 mg | Freq: Once | INTRAVENOUS | Status: AC
  Administered 2017-11-18: 0.25 mg via INTRAVENOUS

## 2017-11-18 MED ORDER — DEXAMETHASONE SODIUM PHOSPHATE 10 MG/ML IJ SOLN
INTRAMUSCULAR | Status: AC
  Filled 2017-11-18: qty 1

## 2017-11-18 MED ORDER — SODIUM CHLORIDE 0.9% FLUSH
10.0000 mL | INTRAVENOUS | Status: DC | PRN
  Administered 2017-11-18: 10 mL via INTRAVENOUS
  Filled 2017-11-18: qty 10

## 2017-11-18 MED ORDER — PALONOSETRON HCL INJECTION 0.25 MG/5ML
INTRAVENOUS | Status: AC
  Filled 2017-11-18: qty 5

## 2017-11-18 MED ORDER — SODIUM CHLORIDE 0.9% FLUSH
10.0000 mL | INTRAVENOUS | Status: DC | PRN
  Filled 2017-11-18: qty 10

## 2017-11-18 MED ORDER — DEXAMETHASONE SODIUM PHOSPHATE 10 MG/ML IJ SOLN
10.0000 mg | Freq: Once | INTRAMUSCULAR | Status: AC
  Administered 2017-11-18: 10 mg via INTRAVENOUS

## 2017-11-18 MED ORDER — HEPARIN SOD (PORK) LOCK FLUSH 100 UNIT/ML IV SOLN
500.0000 [IU] | Freq: Once | INTRAVENOUS | Status: DC | PRN
  Filled 2017-11-18: qty 5

## 2017-11-18 MED ORDER — SODIUM CHLORIDE 0.9 % IV SOLN
400.0000 mg/m2 | Freq: Once | INTRAVENOUS | Status: AC
  Administered 2017-11-18: 964 mg via INTRAVENOUS
  Filled 2017-11-18: qty 48.2

## 2017-11-18 NOTE — Patient Instructions (Signed)
[facility] Discharge Instructions for Patients Receiving Chemotherapy  Today you received the following chemotherapy agents Leucovorin, Fluorouracil.  To help prevent nausea and vomiting after your treatment, we encourage you to take your nausea medication as prescribed.  If you develop nausea and vomiting that is not controlled by your nausea medication, call the clinic.   BELOW ARE SYMPTOMS THAT SHOULD BE REPORTED IMMEDIATELY:  *FEVER GREATER THAN 100.5 F  *CHILLS WITH OR WITHOUT FEVER  NAUSEA AND VOMITING THAT IS NOT CONTROLLED WITH YOUR NAUSEA MEDICATION  *UNUSUAL SHORTNESS OF BREATH  *UNUSUAL BRUISING OR BLEEDING  TENDERNESS IN MOUTH AND THROAT WITH OR WITHOUT PRESENCE OF ULCERS  *URINARY PROBLEMS  *BOWEL PROBLEMS  UNUSUAL RASH Items with * indicate a potential emergency and should be followed up as soon as possible.  Feel free to call the clinic should you have any questions or concerns. The clinic phone number is [phone].  Please show the CHEMO ALERT CARD at check-in to the Emergency Department and triage nurse.   

## 2017-11-18 NOTE — Telephone Encounter (Signed)
Appointments scheduled , contrast given per 4/4 los

## 2017-11-20 ENCOUNTER — Inpatient Hospital Stay: Payer: 59

## 2017-11-20 VITALS — BP 123/67 | HR 75 | Temp 99.1°F | Resp 20

## 2017-11-20 DIAGNOSIS — Z7189 Other specified counseling: Secondary | ICD-10-CM

## 2017-11-20 DIAGNOSIS — C182 Malignant neoplasm of ascending colon: Secondary | ICD-10-CM | POA: Diagnosis not present

## 2017-11-20 DIAGNOSIS — C787 Secondary malignant neoplasm of liver and intrahepatic bile duct: Principal | ICD-10-CM

## 2017-11-20 DIAGNOSIS — C189 Malignant neoplasm of colon, unspecified: Secondary | ICD-10-CM

## 2017-11-20 MED ORDER — SODIUM CHLORIDE 0.9% FLUSH
10.0000 mL | INTRAVENOUS | Status: DC | PRN
  Administered 2017-11-20: 10 mL
  Filled 2017-11-20: qty 10

## 2017-11-20 MED ORDER — HEPARIN SOD (PORK) LOCK FLUSH 100 UNIT/ML IV SOLN
500.0000 [IU] | Freq: Once | INTRAVENOUS | Status: AC | PRN
  Administered 2017-11-20: 500 [IU]
  Filled 2017-11-20: qty 5

## 2017-11-23 DIAGNOSIS — E1169 Type 2 diabetes mellitus with other specified complication: Secondary | ICD-10-CM | POA: Diagnosis not present

## 2017-11-23 DIAGNOSIS — G4733 Obstructive sleep apnea (adult) (pediatric): Secondary | ICD-10-CM | POA: Diagnosis not present

## 2017-12-02 ENCOUNTER — Inpatient Hospital Stay: Payer: 59

## 2017-12-02 VITALS — BP 124/76 | HR 74 | Temp 98.6°F | Resp 18

## 2017-12-02 DIAGNOSIS — C787 Secondary malignant neoplasm of liver and intrahepatic bile duct: Principal | ICD-10-CM

## 2017-12-02 DIAGNOSIS — Z95828 Presence of other vascular implants and grafts: Secondary | ICD-10-CM

## 2017-12-02 DIAGNOSIS — C189 Malignant neoplasm of colon, unspecified: Secondary | ICD-10-CM

## 2017-12-02 DIAGNOSIS — C182 Malignant neoplasm of ascending colon: Secondary | ICD-10-CM | POA: Diagnosis not present

## 2017-12-02 DIAGNOSIS — Z7189 Other specified counseling: Secondary | ICD-10-CM

## 2017-12-02 DIAGNOSIS — D500 Iron deficiency anemia secondary to blood loss (chronic): Secondary | ICD-10-CM

## 2017-12-02 LAB — CMP (CANCER CENTER ONLY)
ALT: 42 U/L (ref 0–55)
AST: 27 U/L (ref 5–34)
Albumin: 3.8 g/dL (ref 3.5–5.0)
Alkaline Phosphatase: 97 U/L (ref 40–150)
Anion gap: 7 (ref 3–11)
BUN: 14 mg/dL (ref 7–26)
CHLORIDE: 107 mmol/L (ref 98–109)
CO2: 26 mmol/L (ref 22–29)
CREATININE: 0.88 mg/dL (ref 0.70–1.30)
Calcium: 9.3 mg/dL (ref 8.4–10.4)
Glucose, Bld: 215 mg/dL — ABNORMAL HIGH (ref 70–140)
Potassium: 4.3 mmol/L (ref 3.5–5.1)
SODIUM: 140 mmol/L (ref 136–145)
Total Bilirubin: 0.9 mg/dL (ref 0.2–1.2)
Total Protein: 6.3 g/dL — ABNORMAL LOW (ref 6.4–8.3)

## 2017-12-02 LAB — CBC WITH DIFFERENTIAL (CANCER CENTER ONLY)
Basophils Absolute: 0 10*3/uL (ref 0.0–0.1)
Basophils Relative: 1 %
EOS ABS: 0.2 10*3/uL (ref 0.0–0.5)
EOS PCT: 5 %
HCT: 42.5 % (ref 38.4–49.9)
Hemoglobin: 14 g/dL (ref 13.0–17.1)
LYMPHS ABS: 0.8 10*3/uL — AB (ref 0.9–3.3)
LYMPHS PCT: 16 %
MCH: 31.4 pg (ref 27.2–33.4)
MCHC: 32.9 g/dL (ref 32.0–36.0)
MCV: 95.3 fL (ref 79.3–98.0)
MONO ABS: 0.6 10*3/uL (ref 0.1–0.9)
Monocytes Relative: 13 %
Neutro Abs: 3.1 10*3/uL (ref 1.5–6.5)
Neutrophils Relative %: 65 %
PLATELETS: 114 10*3/uL — AB (ref 140–400)
RBC: 4.46 MIL/uL (ref 4.20–5.82)
RDW: 14.3 % (ref 11.0–14.6)
WBC Count: 4.8 10*3/uL (ref 4.0–10.3)

## 2017-12-02 LAB — RETICULOCYTES
RBC.: 4.46 MIL/uL (ref 4.20–5.82)
RETIC CT PCT: 1.7 % (ref 0.8–1.8)
Retic Count, Absolute: 75.8 10*3/uL (ref 34.8–93.9)

## 2017-12-02 MED ORDER — SODIUM CHLORIDE 0.9 % IV SOLN
2400.0000 mg/m2 | INTRAVENOUS | Status: DC
  Administered 2017-12-02: 5800 mg via INTRAVENOUS
  Filled 2017-12-02: qty 116

## 2017-12-02 MED ORDER — DEXTROSE 5 % IV SOLN: Freq: Once | INTRAVENOUS | Status: DC

## 2017-12-02 MED ORDER — SODIUM CHLORIDE 0.9 % IV SOLN
400.0000 mg/m2 | Freq: Once | INTRAVENOUS | Status: AC
  Administered 2017-12-02: 964 mg via INTRAVENOUS
  Filled 2017-12-02: qty 48.2

## 2017-12-02 MED ORDER — SODIUM CHLORIDE 0.9% FLUSH
10.0000 mL | INTRAVENOUS | Status: DC | PRN
  Administered 2017-12-02: 10 mL via INTRAVENOUS
  Filled 2017-12-02: qty 10

## 2017-12-02 MED ORDER — PALONOSETRON HCL INJECTION 0.25 MG/5ML
INTRAVENOUS | Status: AC
Start: 2017-12-02 — End: 2017-12-02
  Filled 2017-12-02: qty 5

## 2017-12-02 MED ORDER — PALONOSETRON HCL INJECTION 0.25 MG/5ML
0.2500 mg | Freq: Once | INTRAVENOUS | Status: AC
  Administered 2017-12-02: 0.25 mg via INTRAVENOUS

## 2017-12-02 MED ORDER — DEXAMETHASONE SODIUM PHOSPHATE 10 MG/ML IJ SOLN
10.0000 mg | Freq: Once | INTRAMUSCULAR | Status: AC
  Administered 2017-12-02: 10 mg via INTRAVENOUS

## 2017-12-02 MED ORDER — DEXAMETHASONE SODIUM PHOSPHATE 10 MG/ML IJ SOLN
INTRAMUSCULAR | Status: AC
  Filled 2017-12-02: qty 1

## 2017-12-02 MED ORDER — SODIUM CHLORIDE 0.9 % IV SOLN
Freq: Once | INTRAVENOUS | Status: AC
  Administered 2017-12-02: 09:00:00 via INTRAVENOUS

## 2017-12-02 NOTE — Patient Instructions (Signed)
[facility] Discharge Instructions for Patients Receiving Chemotherapy  Today you received the following chemotherapy agents: Leucovorin, Adrucil  To help prevent nausea and vomiting after your treatment, we encourage you to take your nausea medication as directed  If you develop nausea and vomiting that is not controlled by your nausea medication, call the clinic.   BELOW ARE SYMPTOMS THAT SHOULD BE REPORTED IMMEDIATELY:  *FEVER GREATER THAN 100.5 F  *CHILLS WITH OR WITHOUT FEVER  NAUSEA AND VOMITING THAT IS NOT CONTROLLED WITH YOUR NAUSEA MEDICATION  *UNUSUAL SHORTNESS OF BREATH  *UNUSUAL BRUISING OR BLEEDING  TENDERNESS IN MOUTH AND THROAT WITH OR WITHOUT PRESENCE OF ULCERS  *URINARY PROBLEMS  *BOWEL PROBLEMS  UNUSUAL RASH Items with * indicate a potential emergency and should be followed up as soon as possible.  Feel free to call the clinic should you have any questions or concerns. The clinic phone number is [phone].  Please show the CHEMO ALERT CARD at check-in to the Emergency Department and triage nurse.   

## 2017-12-04 ENCOUNTER — Inpatient Hospital Stay: Payer: 59

## 2017-12-04 VITALS — BP 118/71 | HR 82 | Temp 98.8°F | Resp 18

## 2017-12-04 DIAGNOSIS — Z7189 Other specified counseling: Secondary | ICD-10-CM

## 2017-12-04 DIAGNOSIS — C182 Malignant neoplasm of ascending colon: Secondary | ICD-10-CM | POA: Diagnosis not present

## 2017-12-04 DIAGNOSIS — C189 Malignant neoplasm of colon, unspecified: Secondary | ICD-10-CM

## 2017-12-04 DIAGNOSIS — C787 Secondary malignant neoplasm of liver and intrahepatic bile duct: Secondary | ICD-10-CM

## 2017-12-04 MED ORDER — HEPARIN SOD (PORK) LOCK FLUSH 100 UNIT/ML IV SOLN
500.0000 [IU] | Freq: Once | INTRAVENOUS | Status: AC | PRN
  Administered 2017-12-04: 500 [IU]
  Filled 2017-12-04: qty 5

## 2017-12-04 MED ORDER — SODIUM CHLORIDE 0.9% FLUSH
10.0000 mL | INTRAVENOUS | Status: DC | PRN
  Administered 2017-12-04: 10 mL
  Filled 2017-12-04: qty 10

## 2017-12-09 ENCOUNTER — Encounter (HOSPITAL_COMMUNITY): Payer: Self-pay

## 2017-12-09 ENCOUNTER — Ambulatory Visit (HOSPITAL_COMMUNITY)
Admission: RE | Admit: 2017-12-09 | Discharge: 2017-12-09 | Disposition: A | Discharge status: Home / Self Care | Payer: 59 | Source: Ambulatory Visit | Attending: Hematology | Admitting: Hematology

## 2017-12-09 DIAGNOSIS — K760 Fatty (change of) liver, not elsewhere classified: Secondary | ICD-10-CM | POA: Insufficient documentation

## 2017-12-09 DIAGNOSIS — I700 Atherosclerosis of aorta: Secondary | ICD-10-CM | POA: Insufficient documentation

## 2017-12-09 DIAGNOSIS — I2510 Atherosclerotic heart disease of native coronary artery without angina pectoris: Secondary | ICD-10-CM | POA: Insufficient documentation

## 2017-12-09 DIAGNOSIS — Z9221 Personal history of antineoplastic chemotherapy: Secondary | ICD-10-CM | POA: Insufficient documentation

## 2017-12-09 DIAGNOSIS — C189 Malignant neoplasm of colon, unspecified: Secondary | ICD-10-CM

## 2017-12-09 DIAGNOSIS — C787 Secondary malignant neoplasm of liver and intrahepatic bile duct: Secondary | ICD-10-CM | POA: Insufficient documentation

## 2017-12-09 HISTORY — DX: Malignant neoplasm of colon, unspecified: C18.9

## 2017-12-09 MED ORDER — IOHEXOL 300 MG/ML  SOLN
100.0000 mL | Freq: Once | INTRAMUSCULAR | Status: AC | PRN
  Administered 2017-12-09: 100 mL via INTRAVENOUS

## 2017-12-15 NOTE — Progress Notes (Signed)
[name]    HEMATOLOGY/ONCOLOGY CLINIC NOTE  Date of Service: 12/16/17  Patient Care Team: [name], MD as PCP - General (Family Medicine) [name] M.D. 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 KRAS mutated BRAF, NRAS mutation neg MSI Stable.  PET/CT scan done on 06/25/2016 Show multiple foci of hypermetabolic hepatic metastases (atleast 4-5) and 2 hypermetabolic peritoneal nodules along the dorsal peritoneal surface concerning for peritoneal metastases.  MRI Liver 07/27/2016 - Multiple small liver metastases throughout the right and left hepatic lobes, largest measuring 2.1 cm. 1.2 cm enhancing peritoneal nodule in left paracolic gutter, suspicious for peritoneal metastasis. Other small peritoneal nodules better visualized on recent PET-CT which showed hypermetabolic activity, also suspicious for peritoneal metastases.   CT CHEST WO CONTRAST 10/12/2016  [facility] Result Impression   1. Congenital variant of bilateral middle lobes. 2. Groundglass opacification with regions of bronchiectasis in the right lower lobe adjacent to the fissure, consistent with inflammatory/infectious changes. 3. No definitive evidence of metastatic disease to the chest. 4. Probable sebaceous cyst in the subcutaneous tissues beneath the left anterior chest.   MR ABDOMEN AND PELVIS HAFBXUXY FOLLOW UP2/26/2018 [facility] Result Impression    Decreased size of multiple hepatic lesions and two peritoneal nodules compared to outside MRI 07/27/2016. No new lesions identified in the  abdomen or pelvis.     PREVIOUS TREATMENT  FOLFOX x 10 cycles. 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 (without 5FU bolus - due to thrombocytopenia). S/p Y-90 treatment to 1 hepatic lobe.  Plan for bilobar hepatic Y90 radio-embolization.  INTERVAL HISTORY   Scott Gallagher is here for follow-up for his metastatic colon cancer and for his next cycle of 5FU/leucovorin for maintenance therapy. The patient's last visit with [place] was on 11/18/17. He is accompanied today by his wife. The pt reports that he is doing well overall.   The pt reports that he ran out of insulin a week ago and will be meeting with an endocrinologist next week. I suggested that the pt contact his PCP for an insulin refill today and not allow two weeks to go by without being on insulin.   The pt notes that his right abdomen/ right flank at the site of his previous surgery occasionally hurts and is not necessarily correlated with bowel movements.   Of note since the patient's last visit, pt has had CT C/A/P completed on 12/09/17 with results revealing 1. Heterogeneous hepatic steatosis. Given this mild limitation, no evidence of progressive hepatic metastasis. Similar nonspecific capsular irregularity along the inferior right hepatic lobe. 2. Suspect developing omental/peritoneal metastasis, as evidenced by new and increased omental nodularity. Trace cul-de-sac fluid is nonspecific but could also be secondary. 3.  No acute process or evidence of metastatic disease in the chest. 4. Age advanced coronary artery atherosclerosis. Recommend assessment of coronary risk factors and consideration of medical therapy. 5.  Aortic Atherosclerosis (ICD10-I70.0). 6. Possible developing cirrhosis. Correlate with risk factors.  Lab results today (12/16/17) of CBC, CMP, and  Reticulocytes is as follows:  all values are WNL except for Platelets at 113k, Lymphs Abs at 0.8k, Glucose at 249. CEA 12/16/17 is 13.92. Last CEA on 11/18/17 showed some increase to 11.87.   On review of systems, pt reports intermittent right flank pain, some fatigue, eating well, unchanging pedal neuropathy, and denies hydrating well, back pains, pain along the spine, and any other symptoms.     MEDICAL HISTORY:  Past Medical History:  Diagnosis Date  . Anemia   . Arthritis   . Colon cancer ([place]) dx'd 05/2016  . Diabetes mellitus without complication (HCC)    diet controlled  . History of blood transfusion   . Hyperlipidemia   . liver mets dx'd 05/2016  . Sleep apnea    cpap  . Wears glasses     SURGICAL HISTORY: Past Surgical History:  Procedure Laterality Date  . COLON SURGERY    . IR GENERIC HISTORICAL  09/24/2016   IR CV LINE INJECTION 09/24/2016 [facility]  . IR GENERIC HISTORICAL  09/24/2016   IR US GUIDE VASC ACCESS RIGHT 09/24/2016 [facility]  . IR GENERIC HISTORICAL  09/24/2016   IR FLUORO GUIDE CV LINE RIGHT 09/24/2016 [facility]  . IR GENERIC HISTORICAL  09/30/2016   IR FLUORO GUIDE PORT INSERTION RIGHT 09/30/2016 Markus Daft, MD [facility]  . IR GENERIC HISTORICAL  09/30/2016   IR US GUIDE VASC ACCESS RIGHT 09/30/2016 Markus Daft, MD [facility]  . IR GENERIC HISTORICAL  09/30/2016   IR REMOVAL TUN ACCESS W/ PORT W/O FL MOD SED 09/30/2016 Markus Daft, MD [facility]  . LAPAROSCOPIC RIGHT HEMI COLECTOMY Right 06/17/2016   Procedure: LAPAROSCOPIC ASSISTED  RIGHT HEMI COLECTOMY;  Surgeon: [name], MD;  Location: [facility];  Service: General;  Laterality: Right;  . PORTACATH PLACEMENT Left 07/13/2016   Procedure: INSERTION PORT-A-CATH left subclavian;  Surgeon: [name], MD;  Location: [facility];  Service: General;  Laterality: Left;  . SHOULDER ACROMIOPLASTY Right 12/20/2014   Procedure: SHOULDER ACROMIOPLASTY;  Surgeon: Melrose Nakayama, MD;  Location: [place];  Service:  Orthopedics;  Laterality: Right;  . SHOULDER ARTHROSCOPY Right 12/20/2014   Procedure: RIGHT ARTHROSCOPY SHOULDER WITH DEBRIDEMENT;  Surgeon: Melrose Nakayama, MD;  Location: [place];  Service: Orthopedics;  Laterality: Right;  . TESTICLE SURGERY     as teen  . TONSILLECTOMY    . WISDOM TOOTH EXTRACTION      SOCIAL HISTORY: Social History   Socioeconomic History  . Marital status: Married    Spouse name: Not on file  . Number of children: Not on file  . Years of education: Not on file  . Highest education level: Not on file  Occupational History  . Occupation: cemetery maintenance  Social Needs  . Financial resource strain: Not on file  . Food insecurity:    Worry: Not on file    Inability: Not on file  . Transportation needs:    Medical: Not on file    Non-medical: Not on file  Tobacco Use  . Smoking status: Former Smoker    Packs/day: 1.50    Years: 29.00    Pack years: 43.50    Types: Cigarettes    Last attempt to quit: 12/16/2012    Years since quitting: 5.0  . Smokeless tobacco: Never Used  . Tobacco comment: 1-2 ppd from age [age]y to 2014  Substance and Sexual Activity  . Alcohol use: Yes    Alcohol/week: 1.8 oz  Types: 3 Shots of liquor per week    Comment:  some days  . Drug use: No  . Sexual activity: Not on file  Lifestyle  . Physical activity:    Days per week: Not on file    Minutes per session: Not on file  . Stress: Not on file  Relationships  . Social connections:    Talks on phone: Not on file    Gets together: Not on file    Attends religious service: Not on file    Active member of club or organization: Not on file    Attends meetings of clubs or organizations: Not on file    Relationship status: Not on file  . Intimate partner violence:    Fear of current or ex partner: Not on file    Emotionally abused: Not on file    Physically abused: Not on file    Forced sexual activity: Not on file  Other Topics Concern  . Not on  file  Social History Narrative  . Not on file    FAMILY HISTORY: Family History  Problem Relation Age of Onset  . Diabetes Other   . Hyperlipidemia Other   . Hypertension Other   . Breast cancer Maternal Aunt 70  . Prostate cancer Maternal Uncle 75  . Lung cancer Paternal Uncle 72  . Stroke Maternal Grandfather 72  . Cancer Paternal Grandmother 21       dx cancer of pancreas and colon, unknown if separate primaries  . Heart Problems Paternal Grandfather        d. 42  . Prostate cancer Maternal Uncle 44  . Breast cancer Maternal Aunt 75  . Breast cancer Maternal Aunt 68  . Cervical cancer Maternal Aunt 68  . Pancreatic cancer Maternal Aunt 54       d. 58y; heavy smoker  . Colon cancer Paternal Uncle 10       s/p partial colectomy; dx. second colon cancer at age 73-64    ALLERGIES:  is allergic to penicillins.  MEDICATIONS:  Current Outpatient Medications  Medication Sig Dispense Refill  . dexamethasone (DECADRON) 4 MG tablet TAKE 2 TABLETS BY MOUTH WITH FOOD DAILY (START THE DAY AFTER CHEMOTHERAPY FOR 2 DAYS) 30 tablet 0  . DULoxetine (CYMBALTA) 60 MG capsule TAKE 1 CAPSULE BY MOUTH EVERY DAY 30 capsule 1  . Insulin Glargine (BASAGLAR KWIKPEN) 100 UNIT/ML SOPN Inject 46 Units into the skin every morning.    . lidocaine-prilocaine (EMLA) cream Apply to affected area once 30 g 3  . Multiple Vitamin (MULTIVITAMIN) tablet Take 1 tablet by mouth daily.    . prochlorperazine (COMPAZINE) 10 MG tablet TAKE 1 TABLET BY MOUTH EVERY 6 HOURS AS NEEDED FOR NAUSEA / VOMITING 30 tablet 1  . traMADol (ULTRAM) 50 MG tablet TAKE 1 TABLET BY MOUTH EVERY 6 HOURS AS NEEDED FOR MODERATE / SEVERE PAIN 60 tablet 0  . ondansetron (ZOFRAN) 8 MG tablet Take 1 tablet (8 mg total) by mouth 2 (two) times daily as needed for refractory nausea / vomiting. Start on day 3 after chemotherapy. (Patient not taking: Reported on 12/16/2017) 30 tablet 1   No current facility-administered medications for this visit.      REVIEW OF SYSTEMS:   .10 Point review of Systems was done is negative except as noted above.    PHYSICAL EXAMINATION:  ECOG PERFORMANCE STATUS: 1 - Symptomatic but completely ambulatory . GENERAL:alert, in no acute distress and comfortable SKIN: no acute rashes, no  significant lesions EYES: conjunctiva are pink and non-injected, sclera anicteric OROPHARYNX: MMM, no exudates, no oropharyngeal erythema or ulceration NECK: supple, no JVD LYMPH:  no palpable lymphadenopathy in the cervical, axillary or inguinal regions LUNGS: clear to auscultation b/l with normal respiratory effort HEART: regular rate & rhythm ABDOMEN:  normoactive bowel sounds , non tender, not distended. Extremity: no pedal edema PSYCH: alert & oriented x 3 with fluent speech NEURO: no focal motor/sensory deficits  LABORATORY DATA:  I have reviewed the data as listed  .[name] CBC Latest Ref Rng & Units 12/23/2017 12/16/2017 12/02/2017  WBC 4.0 - 10.5 K/uL 7.6 5.4 4.8  Hemoglobin 13.0 - 17.0 g/dL 14.6 14.4 14.0  Hematocrit 39.0 - 52.0 % 43.3 43.2 42.5  Platelets 150 - 400 K/uL 138(L) 113(L) 114(L)   . CMP Latest Ref Rng & Units 12/23/2017 12/16/2017 12/02/2017  Glucose 65 - 99 mg/dL 243(H) 249(H) 215(H)  BUN 6 - 20 mg/dL _0 Creatinine 0.61 - 1.24 mg/dL 0.70 0.89 0.88  Sodium 135 - 145 mmol/L 137 138 140  Potassium 3.5 - 5.1 mmol/L 4.1 4.1 4.3  Chloride 101 - 111 mmol/L 105 105 107  CO2 22 - 32 mmol/L _1 Calcium 8.9 - 10.3 mg/dL 8.8(L) 9.4 9.3  Total Protein 6.5 - 8.1 g/dL 6.6 6.5 6.3(L)  Total Bilirubin 0.3 - 1.2 mg/dL 0.9 1.0 0.9  Alkaline Phos 38 - 126 U/L 86 108 97  AST 15 - 41 U/L _2 ALT 17 - 63 U/L 43 39 42   .       Microscopic Comment 2. COLON AND RECTUM (INCLUDING TRANS-ANAL RESECTION): Specimen: Terminal ileum, right colon and appendix Procedure: Segmental resection Tumor site: Proximal ascending colon Specimen integrity: Intact Macroscopic intactness of mesorectum: Not  applicable: x Complete: NA Near complete: NA Incomplete: NA Cannot be determined (specify): NA Macroscopic tumor perforation: The mass invades through muscularis propria into pericolonic soft tissue Invasive tumor: Maximum size: 5.5 cm Histologic type(s): Adenocarcinoma Histologic grade and differentiation: G3 G1: well differentiated/low grade 1 of 4 Supplemental copy SUPPLEMENTAL for Tory, Haaris ([id number]) Microscopic Comment(continued) G2: moderately differentiated/low grade G3: poorly differentiated/high grade G4: undifferentiated/high grade Type of polyp in which invasive carcinoma arose: Tubular adenoma Microscopic extension of invasive tumor: The mass invade through the muscularis propria into pericolonic soft tissue Lymph-Vascular invasion: Identified Peri-neural invasion: Identified Tumor deposit(s) (discontinuous extramural extension): Present Resection margins: Proximal margin: Negative Distal margin: Negative Circumferential (radial) (posterior ascending, posterior descending; lateral and posterior mid-rectum; and entire lower 1/3 rectum):Negative Mesenteric margin (sigmoid and transverse): NA Distance closest margin (if all above margins negative): 3.7 cm from the non peritonealized pericolic soft tissue margin Trans-anal resection margins only: Deep margin: NA Mucosal Margin: NA Distance closest mucosal margin (if negative): NA Treatment effect (neo-adjuvant therapy): NA Additional polyp(s): Negative Non-neoplastic findings: Unremarkable Lymph nodes: number examined 18; number positive: 5 Pathologic Staging: pT3, N2a, M1a Ancillary studies: MSI ordered      RADIOGRAPHIC STUDIES: I have personally reviewed the radiological images as listed and agreed with the findings in the report. CT abd/pelvis 12/09/2017: IMPRESSION: 1. Heterogeneous hepatic steatosis. Given this mild limitation, no evidence of progressive hepatic metastasis. Similar  nonspecific capsular irregularity along the inferior right hepatic lobe. 2. Suspect developing omental/peritoneal metastasis, as evidenced by new and increased omental nodularity. Trace cul-de-sac fluid is nonspecific but could also be secondary. 3.  No acute process or evidence of metastatic disease in the chest. 4. Age advanced coronary  artery atherosclerosis. Recommend assessment of coronary risk factors and consideration of medical therapy. 5.  Aortic Atherosclerosis (ICD10-I70.0). 6. Possible developing cirrhosis.  Correlate with risk factors.   Electronically Signed   By: Abigail Miyamoto M.D.   On: 12/09/2017 14:23    MR LIVER WWO CONTRAST2/14/2019 [facility] Result Impression   1.Decreased sizes of multiple T2 hyperintense metastatic lesions (in the setting of patient's known colon adenocarcinoma with mucinous features) in both hepatic lobes (the majority of which are tiny). 2.No new lesions. 3.Posttreatment changes within the right hepatic lobe. 4.Other findings, as detailed above.  Result Narrative  MR LIVER WITH AND WITHOUT CONTRAST, 09/30/2017 10:20 AM  INDICATION: liver malignancy _0 liver malignancy _1 C18.9 Metastatic colon cancer to liver (HCC) _2 C78.7 Metastatic colon cancer to liver ([place])  ADDITIONAL HISTORY: None. COMPARISON: MRI abdomen dated 07/01/2017.  TECHNIQUE: Multiplanar, multisequence MR images of the upper abdomen were obtained before and after intravenous administration of gadolinium-based contrast.   FINDINGS:  LOWER CHEST Heart: Normal size. No pericardial effusion. Lungs/pleura: No masses, consolidations, or effusions.  ABDOMEN Liver: Changes of prior radioembolization in the right hepatic lobe. Perfusional anomaly in the posterior right hepatic lobe, likely a venovenous shunt. Decreased sizes of multiple T2 hyperintense metastatic lesions throughout both hepatic lobes (the majority of which are tiny and are  marked in PACS on series 5), with the dominant lesion measuring 10 mm in segment 8 (series 5, image 19), versus 12 mm previously. No new lesions. Gallbladder: Normal. No stones or inflammatory changes. Biliary: No obstruction. Spleen: Splenomegaly, with the spleen measuring 15.2 cm in span. Pancreas: Normal. Adrenals: Normal. Kidneys: No masses, stones, or obstruction. Unchanged fluid signal lesions in both kidneys, measuring up to 2.0 cm in the interpolar region of the left kidney. Stomach/bowel: Unremarkable.    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 KRAS mutated BRAF, NRAS mutation neg MSI Stable.  PET/CT scan done on 06/25/2016 Show multiple foci of hypermetabolic hepatic metastases (atleast 4-5) and 2 hypermetabolic peritoneal nodules along the dorsal peritoneal surface concerning for peritoneal metastases.  MRI Liver 07/27/2016 - Multiple small liver metastases throughout the right and left hepatic lobes, largest measuring 2.1 cm. 1.2 cm enhancing peritoneal nodule in left paracolic gutter, suspicious for peritoneal metastasis. Other small peritoneal nodules better visualized on recent PET-CT which showed hypermetabolic activity, also suspicious for peritoneal metastases.   MRI Abd 10/12/2016 at [facility] --shows improvement in all the liver and the 2 peritoneal lesions and no new lesions.  MRI liver and CT C/A/P on 01/01/2017 as noted above showed improved/stable disease.   MRI Liver 07/01/2017 at [facility] -  1.Three T2 hyperintense metastatic lesions are seen in the liver, with the largest again in segment 8. These appear unchanged from prior, again without appreciable enhancement or restricted diffusion. Several of the previously noted tiny lesions are no longer visualized. No new lesions  identified.  2.Treatment related changes in the posterior right hepatic lobe. 3.A peritoneal nodule adjacent to the splenic flexure is now more conspicuous compared to the prior study but appears similar to more remote imaging and again could relate to neoplastic disease, although is indeterminate.  CT Abd/pelvis 08/23/2017: Interval resolution of previously seen small right hepatic lobe lesion. Stable tiny sub-cm nodule along the capsular surface of the right hepatic lobe. No  new or progressive disease identified. Stable hepatic steatosis with areas of fatty sparing.  MRI 09/30/17:  Decreased sizes of multiple T2 hyperintense metastatic lesions (in the setting of patient's known colon adenocarcinoma with mucinous features) in both hepatic lobes (the majority of which are tiny). No new lesions. Posttreatment changes within the right hepatic lobe. Other findings, as detailed above.   2) Mild thrombocytopenia PLT improved from 84k to 101k (already have held 5FU bolus dose).   PLAN   -Discussed pt labwork today, 12/16/17; blood counts and chemistries are stable.  -Discussed and reviewed 12/09/17 CT C/A/P with pt which revealed Suspected developing omental/peritoneal metastasis, as evidenced by new and increased omental nodularity.  -CEA levels are slowing increasing and on 4/419 were increased to 11.87. -Recommend taking other glucose records in the day, in conversation with his PCP to optimize DM2 management.  -No prohibitive toxicities from his maintenance 5FU/leucovorin and he will continue as planned. -if CT shows liver based progression -- might need additonal liver directed intervention by Dr Pecola Lawless -if peritoneal disease progression ?role of RT vs metastatectomy-Reiterated possibility of local radiation to new nodularities.  -Findings from CT suggest more imminent consideration of second line chemotherapy in the future of 5FU and Irinotecan.  -Continue to follow up with Dr Pecola Lawless on June 27.   -Discussed visiting Dr Crisoforo Oxford at [facility]  to discuss interperitoneal chemotherapy again, will decide upon this at next visit.  -Recommended that the pt refill his insulin today.  -Increase hydration  -The pt has no prohibitive toxicities from continuing 5FU at this time.     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 Counseling regarding advanced care planning and goals of care 05/27/2017  . 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 Genetic testing 10/30/2016  . Port catheter in place 09/25/2016  . Family history of colon cancer 07/24/2016  . Family history of prostate cancer 07/24/2016  . Metastatic colon cancer to liver ([place]) 07/01/2016  . Right Colon cancer metastasized to liver ([place]) 06/17/2016  . Chronic GI bleeding 05/29/2016  . Diabetes mellitus type 2, diet-controlled ([place]) 05/22/2016  . OSA (obstructive sleep apnea) 05/22/2016  . Hyperlipidemia 05/22/2016  . Anemia due to blood loss, chronic   . Antineoplastic chemotherapy induced pancytopenia (CODE) ([place]) 05/21/2016   Plan:  -Continue follow-up with primary care physician [name], MD for optimization of diabetes management - now on basal insulin with improving control.  -Continue use of CPAP for sleep apnea.  F/u for next 2 cycles of maintenance 5Fu as per scheduled appointments RTC with Dr Irene Limbo as scheduled on 01/20/2018   All of the patients questions were answered with apparent satisfaction. The patient knows to call the clinic with any problems, questions or concerns.  . The total time spent in the appointment was 25 minutes and more than 50% was on counseling and direct patient cares.   Sullivan Lone MD [place] AAHIVMS [facility] Hematology/Oncology Physician [place]  (Office):       [phone] (Work  cell):  [phone] (Fax):           [phone]  This document serves as a record of services personally performed by Sullivan Lone, MD. It was created on his behalf by Baldwin Jamaica, a trained medical scribe. The creation  of this record is based on the scribe's personal observations and the provider's statements to them.   .I have reviewed the above documentation for accuracy and completeness, and I agree with the above. Brunetta Genera MD MS

## 2017-12-16 ENCOUNTER — Inpatient Hospital Stay (HOSPITAL_BASED_OUTPATIENT_CLINIC_OR_DEPARTMENT_OTHER): Payer: 59 | Admitting: Hematology

## 2017-12-16 ENCOUNTER — Inpatient Hospital Stay: Payer: 59

## 2017-12-16 ENCOUNTER — Encounter: Payer: Self-pay | Admitting: Hematology

## 2017-12-16 ENCOUNTER — Inpatient Hospital Stay: Payer: 59 | Attending: Hematology

## 2017-12-16 VITALS — BP 121/73 | HR 77 | Temp 97.9°F | Resp 18 | Ht 74.0 in | Wt 259.6 lb

## 2017-12-16 DIAGNOSIS — C182 Malignant neoplasm of ascending colon: Secondary | ICD-10-CM | POA: Diagnosis not present

## 2017-12-16 DIAGNOSIS — G4730 Sleep apnea, unspecified: Secondary | ICD-10-CM | POA: Insufficient documentation

## 2017-12-16 DIAGNOSIS — Z8042 Family history of malignant neoplasm of prostate: Secondary | ICD-10-CM | POA: Diagnosis not present

## 2017-12-16 DIAGNOSIS — Z9989 Dependence on other enabling machines and devices: Secondary | ICD-10-CM | POA: Insufficient documentation

## 2017-12-16 DIAGNOSIS — I700 Atherosclerosis of aorta: Secondary | ICD-10-CM | POA: Diagnosis not present

## 2017-12-16 DIAGNOSIS — Z794 Long term (current) use of insulin: Secondary | ICD-10-CM

## 2017-12-16 DIAGNOSIS — C189 Malignant neoplasm of colon, unspecified: Secondary | ICD-10-CM

## 2017-12-16 DIAGNOSIS — K760 Fatty (change of) liver, not elsewhere classified: Secondary | ICD-10-CM | POA: Insufficient documentation

## 2017-12-16 DIAGNOSIS — Z452 Encounter for adjustment and management of vascular access device: Secondary | ICD-10-CM | POA: Insufficient documentation

## 2017-12-16 DIAGNOSIS — E119 Type 2 diabetes mellitus without complications: Secondary | ICD-10-CM

## 2017-12-16 DIAGNOSIS — Z803 Family history of malignant neoplasm of breast: Secondary | ICD-10-CM

## 2017-12-16 DIAGNOSIS — M1990 Unspecified osteoarthritis, unspecified site: Secondary | ICD-10-CM | POA: Insufficient documentation

## 2017-12-16 DIAGNOSIS — I2510 Atherosclerotic heart disease of native coronary artery without angina pectoris: Secondary | ICD-10-CM | POA: Insufficient documentation

## 2017-12-16 DIAGNOSIS — T451X5S Adverse effect of antineoplastic and immunosuppressive drugs, sequela: Secondary | ICD-10-CM | POA: Insufficient documentation

## 2017-12-16 DIAGNOSIS — C787 Secondary malignant neoplasm of liver and intrahepatic bile duct: Secondary | ICD-10-CM

## 2017-12-16 DIAGNOSIS — Z5111 Encounter for antineoplastic chemotherapy: Secondary | ICD-10-CM | POA: Insufficient documentation

## 2017-12-16 DIAGNOSIS — Z87891 Personal history of nicotine dependence: Secondary | ICD-10-CM

## 2017-12-16 DIAGNOSIS — C786 Secondary malignant neoplasm of retroperitoneum and peritoneum: Secondary | ICD-10-CM

## 2017-12-16 DIAGNOSIS — Z79899 Other long term (current) drug therapy: Secondary | ICD-10-CM | POA: Diagnosis not present

## 2017-12-16 DIAGNOSIS — E785 Hyperlipidemia, unspecified: Secondary | ICD-10-CM | POA: Diagnosis not present

## 2017-12-16 DIAGNOSIS — D6959 Other secondary thrombocytopenia: Secondary | ICD-10-CM

## 2017-12-16 DIAGNOSIS — D500 Iron deficiency anemia secondary to blood loss (chronic): Secondary | ICD-10-CM

## 2017-12-16 DIAGNOSIS — Z95828 Presence of other vascular implants and grafts: Secondary | ICD-10-CM

## 2017-12-16 DIAGNOSIS — Z7189 Other specified counseling: Secondary | ICD-10-CM

## 2017-12-16 LAB — CMP (CANCER CENTER ONLY)
ALBUMIN: 4 g/dL (ref 3.5–5.0)
ALK PHOS: 108 U/L (ref 40–150)
ALT: 39 U/L (ref 0–55)
ANION GAP: 8 (ref 3–11)
AST: 24 U/L (ref 5–34)
BILIRUBIN TOTAL: 1 mg/dL (ref 0.2–1.2)
BUN: 10 mg/dL (ref 7–26)
CALCIUM: 9.4 mg/dL (ref 8.4–10.4)
CO2: 25 mmol/L (ref 22–29)
Chloride: 105 mmol/L (ref 98–109)
Creatinine: 0.89 mg/dL (ref 0.70–1.30)
GFR, Est AFR Am: >60 mL/min (ref >60)
GFR, Estimated: >60 mL/min (ref >60)
GLUCOSE: 249 mg/dL — AB (ref 70–140)
Potassium: 4.1 mmol/L (ref 3.5–5.1)
SODIUM: 138 mmol/L (ref 136–145)
TOTAL PROTEIN: 6.5 g/dL (ref 6.4–8.3)

## 2017-12-16 LAB — CBC WITH DIFFERENTIAL/PLATELET
Basophils Absolute: 0 10*3/uL (ref 0.0–0.1)
Basophils Relative: 0 %
EOS PCT: 4 %
Eosinophils Absolute: 0.2 10*3/uL (ref 0.0–0.5)
HCT: 43.2 % (ref 38.4–49.9)
HEMOGLOBIN: 14.4 g/dL (ref 13.0–17.1)
LYMPHS ABS: 0.8 10*3/uL — AB (ref 0.9–3.3)
LYMPHS PCT: 15 %
MCH: 31.3 pg (ref 27.2–33.4)
MCHC: 33.3 g/dL (ref 32.0–36.0)
MCV: 93.9 fL (ref 79.3–98.0)
Monocytes Absolute: 0.7 10*3/uL (ref 0.1–0.9)
Monocytes Relative: 13 %
NEUTROS PCT: 68 %
Neutro Abs: 3.7 10*3/uL (ref 1.5–6.5)
Platelets: 113 10*3/uL — ABNORMAL LOW (ref 140–400)
RBC: 4.6 MIL/uL (ref 4.20–5.82)
RDW: 14.1 % (ref 11.0–14.6)
WBC: 5.4 10*3/uL (ref 4.0–10.3)

## 2017-12-16 LAB — RETICULOCYTES
RBC.: 4.6 MIL/uL (ref 4.20–5.82)
Retic Count, Absolute: 69 10*3/uL (ref 34.8–93.9)
Retic Ct Pct: 1.5 % (ref 0.8–1.8)

## 2017-12-16 LAB — CEA (IN HOUSE-CHCC): CEA (CHCC-In House): 13.92 ng/mL — ABNORMAL HIGH (ref 0.00–5.00)

## 2017-12-16 MED ORDER — DEXAMETHASONE SODIUM PHOSPHATE 10 MG/ML IJ SOLN
10.0000 mg | Freq: Once | INTRAMUSCULAR | Status: AC
  Administered 2017-12-16: 10 mg via INTRAVENOUS

## 2017-12-16 MED ORDER — SODIUM CHLORIDE 0.9 % IV SOLN
2400.0000 mg/m2 | INTRAVENOUS | Status: DC
  Administered 2017-12-16: 5800 mg via INTRAVENOUS
  Filled 2017-12-16: qty 116

## 2017-12-16 MED ORDER — PALONOSETRON HCL INJECTION 0.25 MG/5ML
INTRAVENOUS | Status: AC
  Filled 2017-12-16: qty 5

## 2017-12-16 MED ORDER — SODIUM CHLORIDE 0.9% FLUSH
10.0000 mL | INTRAVENOUS | Status: DC | PRN
  Administered 2017-12-16: 10 mL via INTRAVENOUS
  Filled 2017-12-16: qty 10

## 2017-12-16 MED ORDER — PALONOSETRON HCL INJECTION 0.25 MG/5ML
0.2500 mg | Freq: Once | INTRAVENOUS | Status: AC
  Administered 2017-12-16: 0.25 mg via INTRAVENOUS

## 2017-12-16 MED ORDER — DEXAMETHASONE SODIUM PHOSPHATE 10 MG/ML IJ SOLN
INTRAMUSCULAR | Status: AC
  Filled 2017-12-16: qty 1

## 2017-12-16 MED ORDER — SODIUM CHLORIDE 0.9 % IV SOLN
Freq: Once | INTRAVENOUS | Status: AC
  Administered 2017-12-16: 10:00:00 via INTRAVENOUS

## 2017-12-16 MED ORDER — SODIUM CHLORIDE 0.9 % IV SOLN
400.0000 mg/m2 | Freq: Once | INTRAVENOUS | Status: AC
  Administered 2017-12-16: 964 mg via INTRAVENOUS
  Filled 2017-12-16: qty 48.2

## 2017-12-16 NOTE — Patient Instructions (Signed)
[facility] Discharge Instructions for Patients Receiving Chemotherapy  Today you received the following chemotherapy agents: Leucovorin, Adrucil  To help prevent nausea and vomiting after your treatment, we encourage you to take your nausea medication as directed  If you develop nausea and vomiting that is not controlled by your nausea medication, call the clinic.   BELOW ARE SYMPTOMS THAT SHOULD BE REPORTED IMMEDIATELY:  *FEVER GREATER THAN 100.5 F  *CHILLS WITH OR WITHOUT FEVER  NAUSEA AND VOMITING THAT IS NOT CONTROLLED WITH YOUR NAUSEA MEDICATION  *UNUSUAL SHORTNESS OF BREATH  *UNUSUAL BRUISING OR BLEEDING  TENDERNESS IN MOUTH AND THROAT WITH OR WITHOUT PRESENCE OF ULCERS  *URINARY PROBLEMS  *BOWEL PROBLEMS  UNUSUAL RASH Items with * indicate a potential emergency and should be followed up as soon as possible.  Feel free to call the clinic should you have any questions or concerns. The clinic phone number is [phone].  Please show the CHEMO ALERT CARD at check-in to the Emergency Department and triage nurse.   

## 2017-12-18 ENCOUNTER — Inpatient Hospital Stay: Payer: 59

## 2017-12-18 VITALS — BP 103/61 | HR 83 | Temp 98.6°F | Resp 18

## 2017-12-18 DIAGNOSIS — C787 Secondary malignant neoplasm of liver and intrahepatic bile duct: Secondary | ICD-10-CM

## 2017-12-18 DIAGNOSIS — Z7189 Other specified counseling: Secondary | ICD-10-CM

## 2017-12-18 DIAGNOSIS — C182 Malignant neoplasm of ascending colon: Secondary | ICD-10-CM | POA: Diagnosis not present

## 2017-12-18 DIAGNOSIS — C189 Malignant neoplasm of colon, unspecified: Secondary | ICD-10-CM

## 2017-12-18 MED ORDER — SODIUM CHLORIDE 0.9% FLUSH
10.0000 mL | INTRAVENOUS | Status: DC | PRN
  Administered 2017-12-18: 10 mL
  Filled 2017-12-18: qty 10

## 2017-12-18 MED ORDER — HEPARIN SOD (PORK) LOCK FLUSH 100 UNIT/ML IV SOLN
500.0000 [IU] | Freq: Once | INTRAVENOUS | Status: AC | PRN
  Administered 2017-12-18: 500 [IU]
  Filled 2017-12-18: qty 5

## 2017-12-23 ENCOUNTER — Encounter (HOSPITAL_COMMUNITY): Payer: Self-pay | Admitting: Emergency Medicine

## 2017-12-23 ENCOUNTER — Emergency Department (HOSPITAL_COMMUNITY): Payer: 59

## 2017-12-23 ENCOUNTER — Other Ambulatory Visit: Payer: Self-pay

## 2017-12-23 ENCOUNTER — Emergency Department (HOSPITAL_COMMUNITY)
Admission: EM | Admit: 2017-12-23 | Discharge: 2017-12-23 | Disposition: A | Discharge status: Home / Self Care | Payer: 59 | Attending: Emergency Medicine | Admitting: Emergency Medicine

## 2017-12-23 DIAGNOSIS — R079 Chest pain, unspecified: Secondary | ICD-10-CM | POA: Diagnosis not present

## 2017-12-23 DIAGNOSIS — Z79899 Other long term (current) drug therapy: Secondary | ICD-10-CM | POA: Diagnosis not present

## 2017-12-23 DIAGNOSIS — R509 Fever, unspecified: Secondary | ICD-10-CM | POA: Diagnosis not present

## 2017-12-23 DIAGNOSIS — E119 Type 2 diabetes mellitus without complications: Secondary | ICD-10-CM | POA: Insufficient documentation

## 2017-12-23 DIAGNOSIS — Z85038 Personal history of other malignant neoplasm of large intestine: Secondary | ICD-10-CM | POA: Insufficient documentation

## 2017-12-23 DIAGNOSIS — R1011 Right upper quadrant pain: Secondary | ICD-10-CM | POA: Diagnosis present

## 2017-12-23 DIAGNOSIS — J189 Pneumonia, unspecified organism: Secondary | ICD-10-CM | POA: Insufficient documentation

## 2017-12-23 LAB — CBC
HCT: 43.3 % (ref 39.0–52.0)
HEMOGLOBIN: 14.6 g/dL (ref 13.0–17.0)
MCH: 31.1 pg (ref 26.0–34.0)
MCHC: 33.7 g/dL (ref 30.0–36.0)
MCV: 92.3 fL (ref 78.0–100.0)
PLATELETS: 138 10*3/uL — AB (ref 150–400)
RBC: 4.69 MIL/uL (ref 4.22–5.81)
RDW: 14.1 % (ref 11.5–15.5)
WBC: 7.6 10*3/uL (ref 4.0–10.5)

## 2017-12-23 LAB — COMPREHENSIVE METABOLIC PANEL
ALBUMIN: 3.6 g/dL (ref 3.5–5.0)
ALT: 43 U/L (ref 17–63)
AST: 23 U/L (ref 15–41)
Alkaline Phosphatase: 86 U/L (ref 38–126)
Anion gap: 9 (ref 5–15)
BUN: 14 mg/dL (ref 6–20)
CHLORIDE: 105 mmol/L (ref 101–111)
CO2: 23 mmol/L (ref 22–32)
CREATININE: 0.7 mg/dL (ref 0.61–1.24)
Calcium: 8.8 mg/dL — ABNORMAL LOW (ref 8.9–10.3)
GFR calc non Af Amer: >60 mL/min (ref >60)
Glucose, Bld: 243 mg/dL — ABNORMAL HIGH (ref 65–99)
Potassium: 4.1 mmol/L (ref 3.5–5.1)
SODIUM: 137 mmol/L (ref 135–145)
Total Bilirubin: 0.9 mg/dL (ref 0.3–1.2)
Total Protein: 6.6 g/dL (ref 6.5–8.1)

## 2017-12-23 LAB — URINALYSIS, ROUTINE W REFLEX MICROSCOPIC
Bilirubin Urine: NEGATIVE
Glucose, UA: >=500 mg/dL — AB
Hgb urine dipstick: NEGATIVE
Ketones, ur: NEGATIVE mg/dL
Leukocytes, UA: NEGATIVE
Nitrite: NEGATIVE
PROTEIN: NEGATIVE mg/dL
Specific Gravity, Urine: 1.032 — ABNORMAL HIGH (ref 1.005–1.030)
pH: 7 (ref 5.0–8.0)

## 2017-12-23 LAB — LIPASE, BLOOD: LIPASE: 70 U/L — AB (ref 11–51)

## 2017-12-23 MED ORDER — LEVOFLOXACIN 500 MG PO TABS: 500.0000 mg | ORAL_TABLET | Freq: Every day | ORAL | 0 refills | Status: DC

## 2017-12-23 MED ORDER — ACETAMINOPHEN 500 MG PO TABS
1000.0000 mg | ORAL_TABLET | Freq: Once | ORAL | Status: AC
  Administered 2017-12-23: 1000 mg via ORAL
  Filled 2017-12-23: qty 2

## 2017-12-23 MED ORDER — VANCOMYCIN HCL IN DEXTROSE 1-5 GM/200ML-% IV SOLN
1000.0000 mg | Freq: Once | INTRAVENOUS | Status: AC
  Administered 2017-12-23: 1000 mg via INTRAVENOUS
  Filled 2017-12-23: qty 200

## 2017-12-23 MED ORDER — IOPAMIDOL (ISOVUE-300) INJECTION 61%
100.0000 mL | Freq: Once | INTRAVENOUS | Status: AC | PRN
  Administered 2017-12-23: 100 mL via INTRAVENOUS

## 2017-12-23 MED ORDER — SODIUM CHLORIDE 0.9 % IV SOLN
2.0000 g | Freq: Once | INTRAVENOUS | Status: AC
  Administered 2017-12-23: 2 g via INTRAVENOUS
  Filled 2017-12-23: qty 2

## 2017-12-23 MED ORDER — MORPHINE SULFATE (PF) 4 MG/ML IV SOLN: 6.0000 mg | INTRAVENOUS | Status: DC | PRN

## 2017-12-23 MED ORDER — IOPAMIDOL (ISOVUE-300) INJECTION 61%
INTRAVENOUS | Status: AC
  Filled 2017-12-23: qty 100

## 2017-12-23 MED ORDER — HEPARIN SOD (PORK) LOCK FLUSH 100 UNIT/ML IV SOLN
500.0000 [IU] | Freq: Once | INTRAVENOUS | Status: AC
  Administered 2017-12-23: 500 [IU]
  Filled 2017-12-23: qty 5

## 2017-12-23 NOTE — ED Triage Notes (Signed)
Pt reports having RUQ pain that started suddenly tonight. Pt reports hx of colon cancer and is currently under chemo. Last dose of chemo on 12/16/17

## 2017-12-23 NOTE — ED Provider Notes (Signed)
[facility] DEPT Provider Note   CSN: [id number] Arrival date & time: 12/23/17  0301     History   Chief Complaint Chief Complaint  Patient presents with  . Abdominal Pain    HPI Scott Gallagher is a 47 y.o. male.  HPI Patient reports worsening right upper quadrant pain which began this evening.  No chest pain or cough.  Low-grade fever of 100.4 in the emergency department.  Currently has advanced metastatic colon cancer and is currently getting chemotherapy.  His most recent CT scan demonstrated worsening disease in his abdomen with peritoneal implants.  At this time he is focused on quality of life and understands that he will likely die from his cancer.  He denies back pain.  No dysuria or urinary frequency.  No chest pain or shortness of breath.  Symptoms are mild in severity.   Past Medical History:  Diagnosis Date  . Anemia   . Arthritis   . Colon cancer ([place]) dx'd 05/2016  . Diabetes mellitus without complication (HCC)    diet controlled  . History of blood transfusion   . Hyperlipidemia   . liver mets dx'd 05/2016  . Sleep apnea    cpap  . Wears glasses     Patient Active Problem List   Diagnosis Date Noted  . Counseling regarding advanced care planning and goals of care 05/27/2017  . 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 Genetic testing 10/30/2016  . Port catheter in place 09/25/2016  . Family history of colon cancer 07/24/2016  . Family history of prostate cancer 07/24/2016  . Metastatic colon cancer to liver ([place]) 07/01/2016  . Right Colon cancer metastasized to liver ([place]) 06/17/2016  . Chronic GI bleeding 05/29/2016  . Diabetes mellitus type 2, diet-controlled ([place]) 05/22/2016  . OSA (obstructive sleep apnea) 05/22/2016  . Hyperlipidemia 05/22/2016  . Anemia due to blood loss, chronic   . Antineoplastic chemotherapy induced pancytopenia (CODE) ([place]) 05/21/2016    Past Surgical History:  Procedure Laterality  Date  . COLON SURGERY    . IR GENERIC HISTORICAL  09/24/2016   IR CV LINE INJECTION 09/24/2016 [facility]  . IR GENERIC HISTORICAL  09/24/2016   IR US GUIDE VASC ACCESS RIGHT 09/24/2016 [facility]  . IR GENERIC HISTORICAL  09/24/2016   IR FLUORO GUIDE CV LINE RIGHT 09/24/2016 [facility]  . IR GENERIC HISTORICAL  09/30/2016   IR FLUORO GUIDE PORT INSERTION RIGHT 09/30/2016 Markus Daft, MD [facility]  . IR GENERIC HISTORICAL  09/30/2016   IR US GUIDE VASC ACCESS RIGHT 09/30/2016 Markus Daft, MD [facility]  . IR GENERIC HISTORICAL  09/30/2016   IR REMOVAL TUN ACCESS W/ PORT W/O FL MOD SED 09/30/2016 Markus Daft, MD [facility]  . LAPAROSCOPIC RIGHT HEMI COLECTOMY Right 06/17/2016   Procedure: LAPAROSCOPIC ASSISTED  RIGHT HEMI COLECTOMY;  Surgeon: [name], MD;  Location: [facility];  Service: General;  Laterality: Right;  . PORTACATH PLACEMENT Left 07/13/2016   Procedure: INSERTION PORT-A-CATH left subclavian;  Surgeon: [name], MD;  Location: [facility];  Service: General;  Laterality: Left;  . SHOULDER ACROMIOPLASTY Right 12/20/2014   Procedure: SHOULDER ACROMIOPLASTY;  Surgeon: Melrose Nakayama, MD;  Location: [facility];  Service: Orthopedics;  Laterality: Right;  . SHOULDER ARTHROSCOPY Right 12/20/2014   Procedure: RIGHT ARTHROSCOPY SHOULDER WITH DEBRIDEMENT;  Surgeon: Melrose Nakayama, MD;  Location: [facility];  Service: Orthopedics;  Laterality: Right;  . TESTICLE SURGERY     as teen  .  TONSILLECTOMY    . WISDOM TOOTH EXTRACTION          Home Medications    Prior to Admission medications   Medication Sig Start Date End Date Taking? Authorizing Provider  dexamethasone (DECADRON) 4 MG tablet TAKE 2 TABLETS BY MOUTH WITH FOOD DAILY (START THE DAY AFTER CHEMOTHERAPY FOR 2 DAYS) 10/22/17  Yes Brunetta Genera, MD  Insulin Glargine (BASAGLAR KWIKPEN) 100 UNIT/ML SOPN Inject 46 Units into the skin every morning.   Yes [provider]    lidocaine-prilocaine (EMLA) cream Apply to affected area once 07/01/16  Yes Brunetta Genera, MD  Multiple Vitamin (MULTIVITAMIN) tablet Take 1 tablet by mouth daily.   Yes [provider]  ondansetron (ZOFRAN) 8 MG tablet Take 1 tablet (8 mg total) by mouth 2 (two) times daily as needed for refractory nausea / vomiting. Start on day 3 after chemotherapy. 07/01/16  Yes Brunetta Genera, MD  prochlorperazine (COMPAZINE) 10 MG tablet TAKE 1 TABLET BY MOUTH EVERY 6 HOURS AS NEEDED FOR NAUSEA / VOMITING 11/06/17  Yes Brunetta Genera, MD  traMADol (ULTRAM) 50 MG tablet TAKE 1 TABLET BY MOUTH EVERY 6 HOURS AS NEEDED FOR MODERATE / SEVERE PAIN 11/03/17  Yes Brunetta Genera, MD  DULoxetine (CYMBALTA) 60 MG capsule TAKE 1 CAPSULE BY MOUTH EVERY DAY Patient not taking: Reported on 12/23/2017 08/27/17   Brunetta Genera, MD  levofloxacin (LEVAQUIN) 500 MG tablet Take 1 tablet (500 mg total) by mouth daily. 12/23/17   Jola Schmidt, MD    Family History Family History  Problem Relation Age of Onset  . Diabetes Other   . Hyperlipidemia Other   . Hypertension Other   . Breast cancer Maternal Aunt 70  . Prostate cancer Maternal Uncle 75  . Lung cancer Paternal Uncle 63  . Stroke Maternal Grandfather 72  . Cancer Paternal Grandmother 78       dx cancer of pancreas and colon, unknown if separate primaries  . Heart Problems Paternal Grandfather        d. 60  . Prostate cancer Maternal Uncle 13  . Breast cancer Maternal Aunt 75  . Breast cancer Maternal Aunt 68  . Cervical cancer Maternal Aunt 68  . Pancreatic cancer Maternal Aunt 54       d. 58y; heavy smoker  . Colon cancer Paternal Uncle 76       s/p partial colectomy; dx. second colon cancer at age 79-64    Social History Social History   Tobacco Use  . Smoking status: Former Smoker    Packs/day: 1.50    Years: 29.00    Pack years: 43.50    Types: Cigarettes    Last attempt to quit: 12/16/2012    Years since  quitting: 5.0  . Smokeless tobacco: Never Used  . Tobacco comment: 1-2 ppd from age [age]y to 2014  Substance Use Topics  . Alcohol use: Yes    Alcohol/week: 1.8 oz    Types: 3 Shots of liquor per week    Comment:  some days  . Drug use: No     Allergies   Penicillins   Review of Systems Review of Systems  All other systems reviewed and are negative.    Physical Exam Updated Vital Signs BP 131/85   Pulse 97   Temp (!) 100.4 F (38 C) (Rectal)   Resp 17   Ht _0  (1.88 m)   Wt 117.9 kg (260 lb)   SpO2 96%  BMI 33.38 kg/m   Physical Exam  Constitutional: He is oriented to person, place, and time. He appears well-developed and well-nourished.  HENT:  Head: Normocephalic and atraumatic.  Eyes: EOM are normal.  Neck: Normal range of motion.  Cardiovascular: Normal rate, regular rhythm, normal heart sounds and intact distal pulses.  Pulmonary/Chest: Effort normal and breath sounds normal. No respiratory distress.  Abdominal: Soft. He exhibits no distension.  Mild right upper quadrant tenderness  Musculoskeletal: Normal range of motion.  Neurological: He is alert and oriented to person, place, and time.  Skin: Skin is warm and dry.  Psychiatric: He has a normal mood and affect. Judgment normal.  Nursing note and vitals reviewed.    ED Treatments / Results  Labs (all labs ordered are listed, but only abnormal results are displayed) Labs Reviewed  LIPASE, BLOOD - Abnormal; Notable for the following components:      Result Value   Lipase 70 (*)    All other components within normal limits  COMPREHENSIVE METABOLIC PANEL - Abnormal; Notable for the following components:   Glucose, Bld 243 (*)    Calcium 8.8 (*)    All other components within normal limits  CBC - Abnormal; Notable for the following components:   Platelets 138 (*)    All other components within normal limits  URINALYSIS, ROUTINE W REFLEX MICROSCOPIC - Abnormal; Notable for the following  components:   Specific Gravity, Urine 1.032 (*)    Glucose, UA >=500 (*)    Bacteria, UA RARE (*)    All other components within normal limits    EKG None  Radiology Ct Abdomen Pelvis W Contrast  Result Date: 12/23/2017 CLINICAL DATA:  47 year old with sudden onset of right upper quadrant pain. Elevated lipase. History of colon cancer and right hemicolectomy. History of hepatic Y90 radioembolization. EXAM: CT ABDOMEN AND PELVIS WITH CONTRAST TECHNIQUE: Multidetector CT imaging of the abdomen and pelvis was performed using the standard protocol following bolus administration of intravenous contrast. CONTRAST:  147m ISOVUE-300 IOPAMIDOL (ISOVUE-300) INJECTION 61% COMPARISON:  12/09/2017 and 08/23/2017 FINDINGS: Lower chest: Port catheter tip near the superior cavoatrial junction. Increased parenchymal densities along the dependent aspect of both lower lobes. Again noted is a focal area of band-like consolidation in the right lower lobe which appears to be more dense compared to the previous examination. The lower lobe disease is more prominent on the right than the left. No large pleural effusions. Hepatobiliary: Again noted is heterogeneity in the liver and findings compatible with areas of hepatic steatosis. There appears to be some atrophy involving the posterior right hepatic lobe and this probably is related to previous radioembolization. Again noted are embolization coils in the right hepatic artery region. No definitive liver lesions identified on this examination. Normal appearance of the gallbladder. No significant biliary dilatation. Pancreas: Normal appearance of the pancreas without inflammation or duct dilatation. Spleen: Normal appearance of spleen without enlargement. Adrenals/Urinary Tract: Normal adrenal glands. Evidence for bilateral renal cysts. No hydronephrosis. Normal urinary bladder. Stomach/Bowel: Normal appearance of stomach and duodenum. Prior right hemicolectomy. Moderate amount  of stool in the transverse colon. No evidence for bowel obstruction or focal bowel inflammation. Vascular/Lymphatic: Atherosclerotic disease in the abdominal aorta without aneurysm. The main visceral arteries are patent. Main portal venous system is patent. Normal appearance of the IVC and renal veins. There is no significant lymph node enlargement in the abdomen or pelvis. Reproductive: Prostate is unremarkable. Other: There is probably some soft tissue in the pelvis on sequence  2, image 89. Previously, this looked more like fluid but this is concerning for a peritoneal drop met. Minimal fluid or edema in the central lower abdomen on sequence 2, image 82. Probable peritoneal nodule in the left lower anterior abdomen on sequence 2 image 79 measuring roughly 6 mm and this is new since 08/23/2017. Possible peritoneal nodule adjacent to the descending colon on sequence 2 image 65. Irregular peritoneal nodule along the inferior aspect of spleen adjacent to the splenic flexure of the colon. This nodule measures roughly 1.3 cm on sequence 2, image 48 and measured roughly 1.1 cm on 08/23/2017. Peritoneal or omental nodule along the anterior left abdomen on sequence 2, image 59 measures 1.0 cm and this has clearly enlarged since 08/23/2017. Difficult to exclude peritoneal disease near the anterior abdominal incision on sequence 2, image 63. Concern for subtle peritoneal disease along the anterior aspect of the left hepatic lobe on sequence 2, image 40. Musculoskeletal: No acute abnormality. IMPRESSION: Increasing peripheral and parenchymal densities at the lung bases, particularly on the right side. Some of these findings could be related to atelectasis but also concerning for an inflammatory or postinflammatory process. No acute abnormalities in the abdomen or pelvis. 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 Post treatment changes in the liver with steatosis. No discrete  liver lesions are appreciated on this examination. Electronically Signed   By: Markus Daft M.D.   On: 12/23/2017 07:36    Procedures Procedures (including critical care time)  Medications Ordered in ED Medications  morphine 4 MG/ML injection 6 mg (has no administration in time range)  iopamidol (ISOVUE-300) 61 % injection (has no administration in time range)  vancomycin (VANCOCIN) IVPB 1000 mg/200 mL premix (1,000 mg Intravenous New Bag/Given 12/23/17 0829)  ceFEPIme (MAXIPIME) 2 g in sodium chloride 0.9 % 100 mL IVPB (2 g Intravenous New Bag/Given 12/23/17 0829)  iopamidol (ISOVUE-300) 61 % injection 100 mL (100 mLs Intravenous Contrast Given 12/23/17 0538)  acetaminophen (TYLENOL) tablet 1,000 mg (1,000 mg Oral Given 12/23/17 0827)     Initial Impression / Assessment and Plan / ED Course  I have reviewed the triage vital signs and the nursing notes.  Pertinent labs & imaging results that were available during my care of the patient were reviewed by me and considered in my medical decision making (see chart for details).     Patient with advanced metastatic colon cancer.  He is focused on quality of life.  He is focused on a trip to [place] in the coming days.  He appears to have developing right lower lobe healthcare associated pneumonia based on CT imaging.  Normally the patient be admitted with IV antibiotics.  The patient understands this.  He would prefer to be discharged home on oral antibiotics he understands the risk of worsening pneumonia worsening condition and disability and death.  Despite this he would prefer to go home as he is focused on visiting his daughter in [place] and focused on the time he has left.    Final Clinical Impressions(s) / ED Diagnoses   Final diagnoses:  HCAP (healthcare-associated pneumonia)    ED Discharge Orders        Ordered    levofloxacin (LEVAQUIN) 500 MG tablet  Daily     12/23/17 0820       Jola Schmidt, MD 12/25/17 0022

## 2017-12-23 NOTE — ED Notes (Signed)
Patient transported to CT 

## 2018-01-06 ENCOUNTER — Inpatient Hospital Stay: Payer: 59

## 2018-01-06 VITALS — BP 118/79 | HR 82 | Temp 98.3°F | Resp 16 | Wt 257.2 lb

## 2018-01-06 DIAGNOSIS — C182 Malignant neoplasm of ascending colon: Secondary | ICD-10-CM | POA: Diagnosis not present

## 2018-01-06 DIAGNOSIS — C189 Malignant neoplasm of colon, unspecified: Secondary | ICD-10-CM

## 2018-01-06 DIAGNOSIS — C787 Secondary malignant neoplasm of liver and intrahepatic bile duct: Principal | ICD-10-CM

## 2018-01-06 DIAGNOSIS — Z95828 Presence of other vascular implants and grafts: Secondary | ICD-10-CM

## 2018-01-06 DIAGNOSIS — Z7189 Other specified counseling: Secondary | ICD-10-CM

## 2018-01-06 DIAGNOSIS — D500 Iron deficiency anemia secondary to blood loss (chronic): Secondary | ICD-10-CM

## 2018-01-06 LAB — RETICULOCYTES
RBC.: 4.62 MIL/uL (ref 4.20–5.82)
RETIC CT PCT: 1.7 % (ref 0.8–1.8)
Retic Count, Absolute: 78.5 10*3/uL (ref 34.8–93.9)

## 2018-01-06 LAB — CBC WITH DIFFERENTIAL/PLATELET
BASOS ABS: 0 10*3/uL (ref 0.0–0.1)
Basophils Relative: 1 %
EOS ABS: 0.2 10*3/uL (ref 0.0–0.5)
EOS PCT: 4 %
HCT: 42.9 % (ref 38.4–49.9)
HEMOGLOBIN: 14.5 g/dL (ref 13.0–17.1)
LYMPHS ABS: 1.2 10*3/uL (ref 0.9–3.3)
LYMPHS PCT: 22 %
MCH: 31.4 pg (ref 27.2–33.4)
MCHC: 33.8 g/dL (ref 32.0–36.0)
MCV: 92.9 fL (ref 79.3–98.0)
Monocytes Absolute: 0.7 10*3/uL (ref 0.1–0.9)
Monocytes Relative: 13 %
NEUTROS PCT: 60 %
Neutro Abs: 3.2 10*3/uL (ref 1.5–6.5)
PLATELETS: 168 10*3/uL (ref 140–400)
RBC: 4.62 MIL/uL (ref 4.20–5.82)
RDW: 13.9 % (ref 11.0–14.6)
WBC: 5.2 10*3/uL (ref 4.0–10.3)

## 2018-01-06 LAB — CMP (CANCER CENTER ONLY)
ALT: 29 U/L (ref 0–55)
AST: 24 U/L (ref 5–34)
Albumin: 3.9 g/dL (ref 3.5–5.0)
Alkaline Phosphatase: 113 U/L (ref 40–150)
Anion gap: 11 (ref 3–11)
BILIRUBIN TOTAL: 0.6 mg/dL (ref 0.2–1.2)
BUN: 14 mg/dL (ref 7–26)
CHLORIDE: 104 mmol/L (ref 98–109)
CO2: 25 mmol/L (ref 22–29)
Calcium: 9.8 mg/dL (ref 8.4–10.4)
Creatinine: 0.86 mg/dL (ref 0.70–1.30)
GFR, Est AFR Am: >60 mL/min (ref >60)
Glucose, Bld: 189 mg/dL — ABNORMAL HIGH (ref 70–140)
POTASSIUM: 3.9 mmol/L (ref 3.5–5.1)
Sodium: 140 mmol/L (ref 136–145)
TOTAL PROTEIN: 7 g/dL (ref 6.4–8.3)

## 2018-01-06 MED ORDER — PALONOSETRON HCL INJECTION 0.25 MG/5ML
INTRAVENOUS | Status: AC
  Filled 2018-01-06: qty 5

## 2018-01-06 MED ORDER — DEXAMETHASONE SODIUM PHOSPHATE 10 MG/ML IJ SOLN
INTRAMUSCULAR | Status: AC
  Filled 2018-01-06: qty 1

## 2018-01-06 MED ORDER — SODIUM CHLORIDE 0.9 % IV SOLN
2400.0000 mg/m2 | INTRAVENOUS | Status: DC
  Administered 2018-01-06: 5800 mg via INTRAVENOUS
  Filled 2018-01-06: qty 116

## 2018-01-06 MED ORDER — DEXTROSE 5 % IV SOLN
Freq: Once | INTRAVENOUS | Status: AC
  Administered 2018-01-06: 08:00:00 via INTRAVENOUS

## 2018-01-06 MED ORDER — LEUCOVORIN CALCIUM INJECTION 350 MG
400.0000 mg/m2 | Freq: Once | INTRAVENOUS | Status: AC
  Administered 2018-01-06: 964 mg via INTRAVENOUS
  Filled 2018-01-06: qty 48.2

## 2018-01-06 MED ORDER — SODIUM CHLORIDE 0.9% FLUSH
10.0000 mL | INTRAVENOUS | Status: DC | PRN
  Filled 2018-01-06: qty 10

## 2018-01-06 MED ORDER — PALONOSETRON HCL INJECTION 0.25 MG/5ML
0.2500 mg | Freq: Once | INTRAVENOUS | Status: AC
  Administered 2018-01-06: 0.25 mg via INTRAVENOUS

## 2018-01-06 MED ORDER — HEPARIN SOD (PORK) LOCK FLUSH 100 UNIT/ML IV SOLN
500.0000 [IU] | Freq: Once | INTRAVENOUS | Status: DC | PRN
  Filled 2018-01-06: qty 5

## 2018-01-06 MED ORDER — DEXAMETHASONE SODIUM PHOSPHATE 10 MG/ML IJ SOLN
10.0000 mg | Freq: Once | INTRAMUSCULAR | Status: AC
  Administered 2018-01-06: 10 mg via INTRAVENOUS

## 2018-01-06 MED ORDER — SODIUM CHLORIDE 0.9% FLUSH
10.0000 mL | INTRAVENOUS | Status: DC | PRN
  Administered 2018-01-06: 10 mL via INTRAVENOUS
  Filled 2018-01-06: qty 10

## 2018-01-06 NOTE — Patient Instructions (Signed)
[facility] Discharge Instructions for Patients Receiving Chemotherapy  Today you received the following chemotherapy agents: Leucovorin, Adrucil  To help prevent nausea and vomiting after your treatment, we encourage you to take your nausea medication as directed  If you develop nausea and vomiting that is not controlled by your nausea medication, call the clinic.   BELOW ARE SYMPTOMS THAT SHOULD BE REPORTED IMMEDIATELY:  *FEVER GREATER THAN 100.5 F  *CHILLS WITH OR WITHOUT FEVER  NAUSEA AND VOMITING THAT IS NOT CONTROLLED WITH YOUR NAUSEA MEDICATION  *UNUSUAL SHORTNESS OF BREATH  *UNUSUAL BRUISING OR BLEEDING  TENDERNESS IN MOUTH AND THROAT WITH OR WITHOUT PRESENCE OF ULCERS  *URINARY PROBLEMS  *BOWEL PROBLEMS  UNUSUAL RASH Items with * indicate a potential emergency and should be followed up as soon as possible.  Feel free to call the clinic should you have any questions or concerns. The clinic phone number is [phone].  Please show the CHEMO ALERT CARD at check-in to the Emergency Department and triage nurse.   

## 2018-01-08 ENCOUNTER — Inpatient Hospital Stay: Payer: 59

## 2018-01-08 VITALS — BP 137/82 | HR 68 | Temp 98.0°F | Resp 18

## 2018-01-08 DIAGNOSIS — C189 Malignant neoplasm of colon, unspecified: Secondary | ICD-10-CM

## 2018-01-08 DIAGNOSIS — C787 Secondary malignant neoplasm of liver and intrahepatic bile duct: Secondary | ICD-10-CM

## 2018-01-08 DIAGNOSIS — C182 Malignant neoplasm of ascending colon: Secondary | ICD-10-CM | POA: Diagnosis not present

## 2018-01-08 DIAGNOSIS — Z7189 Other specified counseling: Secondary | ICD-10-CM

## 2018-01-08 MED ORDER — HEPARIN SOD (PORK) LOCK FLUSH 100 UNIT/ML IV SOLN
500.0000 [IU] | Freq: Once | INTRAVENOUS | Status: AC | PRN
  Administered 2018-01-08: 500 [IU]
  Filled 2018-01-08: qty 5

## 2018-01-08 MED ORDER — SODIUM CHLORIDE 0.9% FLUSH
10.0000 mL | INTRAVENOUS | Status: DC | PRN
  Administered 2018-01-08: 10 mL
  Filled 2018-01-08: qty 10

## 2018-01-17 DIAGNOSIS — E119 Type 2 diabetes mellitus without complications: Secondary | ICD-10-CM | POA: Diagnosis not present

## 2018-01-18 DIAGNOSIS — Z794 Long term (current) use of insulin: Secondary | ICD-10-CM | POA: Diagnosis not present

## 2018-01-18 DIAGNOSIS — E1169 Type 2 diabetes mellitus with other specified complication: Secondary | ICD-10-CM | POA: Diagnosis not present

## 2018-01-18 DIAGNOSIS — G620 Drug-induced polyneuropathy: Secondary | ICD-10-CM | POA: Diagnosis not present

## 2018-01-18 NOTE — Progress Notes (Signed)
Scott Gallagher    HEMATOLOGY/ONCOLOGY CLINIC NOTE  Date of Service: 01/20/18  Patient Care Team: [name], MD as PCP - General (Family Medicine) [name] M.D. 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 KRAS mutated BRAF, NRAS mutation neg MSI Stable.  PET/CT scan done on 06/25/2016 Show multiple foci of hypermetabolic hepatic metastases (atleast 4-5) and 2 hypermetabolic peritoneal nodules along the dorsal peritoneal surface concerning for peritoneal metastases.  MRI Liver 07/27/2016 - Multiple small liver metastases throughout the right and left hepatic lobes, largest measuring 2.1 cm. 1.2 cm enhancing peritoneal nodule in left paracolic gutter, suspicious for peritoneal metastasis. Other small peritoneal nodules better visualized on recent PET-CT which showed hypermetabolic activity, also suspicious for peritoneal metastases.   CT CHEST WO CONTRAST 10/12/2016  [facility] Result Impression   1. Congenital variant of bilateral middle lobes. 2. Groundglass opacification with regions of bronchiectasis in the right lower lobe adjacent to the fissure, consistent with inflammatory/infectious changes. 3. No definitive evidence of metastatic disease to the chest. 4. Probable sebaceous cyst in the subcutaneous tissues beneath the left anterior chest.   MR ABDOMEN AND PELVIS IPJASNKN FOLLOW UP2/26/2018 [facility] Result Impression    Decreased size of multiple hepatic lesions and two peritoneal nodules compared to outside MRI 07/27/2016. No new lesions identified in the  abdomen or pelvis.     PREVIOUS TREATMENT  FOLFOX x 10 cycles. 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 (without 5FU bolus - due to thrombocytopenia). S/p Y-90 treatment to 1 hepatic lobe.  Plan for bilobar hepatic Y90 radio-embolization.  INTERVAL HISTORY   Scott Gallagher is here for follow-up for his metastatic colon cancer and for his next cycle of 5FU/leucovorin for maintenance therapy. The patient's last visit with [place] was on 12/16/17. He is accompanied today by his wife. The pt reports that he is doing well overall and recently enjoyed a trip to [place].   The pt reports that he recently had some upper right abdominal pain, and a temperature of 99.1. He notes that the pain radiates to his right shoulder and his right upper abdominal pain last for a couple days and presents about twice a month. He denies association with bowel habits.   He notes that his peripheral neuropathy is less annoying, but has changed to more numbness than tingling. He also notes that, with the exception of this morning, his blood sugars have been fairly well controlled recently.   Of note since the patient's last visit, pt has had CT A/P completed on 12/23/17 with results revealing Increasing peripheral and parenchymal densities at the lung bases, particularly on the right side. Some of these findings could be related to atelectasis but also concerning for an inflammatory or postinflammatory process. No acute abnormalities in the abdomen or pelvis. 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 Post treatment changes in the liver with steatosis. No discrete liver lesions are appreciated on this examination.  Lab results today (01/20/18) of CBC, CMP, and  Reticulocytes is as follows: all values are WNL except for Platelets at 136k,  Lymphs Abs at 0.8k, Glucose at 305.  On review of systems, pt reports peripheral numbness, decreased peripheral tingling, and denies changes in bowel habits, present abdominal pains, leg swelling, and any other symptoms.     MEDICAL HISTORY:  Past Medical History:  Diagnosis Date  . Anemia   . Arthritis   . Colon cancer ([place]) dx'd 05/2016  . Diabetes mellitus without complication (HCC)    diet controlled  . History of blood transfusion   . Hyperlipidemia   . liver mets dx'd 05/2016  . Sleep apnea    cpap  . Wears glasses     SURGICAL HISTORY: Past Surgical History:  Procedure Laterality Date  . COLON SURGERY    . IR GENERIC HISTORICAL  09/24/2016   IR CV LINE INJECTION 09/24/2016 [facility]  . IR GENERIC HISTORICAL  09/24/2016   IR US GUIDE VASC ACCESS RIGHT 09/24/2016 [facility]  . IR GENERIC HISTORICAL  09/24/2016   IR FLUORO GUIDE CV LINE RIGHT 09/24/2016 [facility]  . IR GENERIC HISTORICAL  09/30/2016   IR FLUORO GUIDE PORT INSERTION RIGHT 09/30/2016 Markus Daft, MD [facility]  . IR GENERIC HISTORICAL  09/30/2016   IR US GUIDE VASC ACCESS RIGHT 09/30/2016 Markus Daft, MD [facility]  . IR GENERIC HISTORICAL  09/30/2016   IR REMOVAL TUN ACCESS W/ PORT W/O FL MOD SED 09/30/2016 Markus Daft, MD [facility]  . LAPAROSCOPIC RIGHT HEMI COLECTOMY Right 06/17/2016   Procedure: LAPAROSCOPIC ASSISTED  RIGHT HEMI COLECTOMY;  Surgeon: [name], MD;  Location: [facility];  Service: General;  Laterality: Right;  . PORTACATH PLACEMENT Left 07/13/2016   Procedure: INSERTION PORT-A-CATH left subclavian;  Surgeon: [name], MD;  Location: [facility];  Service: General;  Laterality: Left;  . SHOULDER ACROMIOPLASTY Right 12/20/2014   Procedure: SHOULDER ACROMIOPLASTY;  Surgeon: Melrose Nakayama, MD;  Location: [place];  Service: Orthopedics;  Laterality: Right;  . SHOULDER ARTHROSCOPY Right 12/20/2014   Procedure: RIGHT ARTHROSCOPY SHOULDER WITH DEBRIDEMENT;  Surgeon: Melrose Nakayama, MD;  Location: [place];  Service: Orthopedics;  Laterality: Right;  . TESTICLE SURGERY     as teen  . TONSILLECTOMY    . WISDOM TOOTH EXTRACTION      SOCIAL HISTORY: Social History   Socioeconomic History  . Marital status: Married    Spouse name: Not on file  . Number of children: Not on file  . Years of education: Not on file  . Highest education level: Not on file  Occupational History  . Occupation: cemetery maintenance  Social Needs  . Financial resource strain: Not on file  . Food insecurity:    Worry: Not on file    Inability: Not on file  . Transportation needs:    Medical: Not on file    Non-medical: Not on file  Tobacco Use  . Smoking status: Former Smoker    Packs/day: 1.50    Years: 29.00    Pack years: 43.50    Types: Cigarettes    Last attempt to quit: 12/16/2012    Years since quitting: 5.0  . Smokeless tobacco: Never Used  . Tobacco comment: 1-2 ppd from age [age]y to 2014  Substance and Sexual Activity  . Alcohol use: Yes    Alcohol/week: 1.8 oz    Types: 3 Shots of liquor per week    Comment:  some days  . Drug use: No  .  Sexual activity: Not on file  Lifestyle  . Physical activity:    Days per week: Not on file    Minutes per session: Not on file  . Stress: Not on file  Relationships  . Social connections:    Talks on phone: Not on file    Gets together: Not on file    Attends religious service: Not on file    Active member of club or organization: Not on file    Attends meetings of clubs or organizations: Not on file    Relationship status: Not on file  . Intimate partner violence:    Fear of current or ex partner: Not on file    Emotionally abused: Not on file    Physically abused: Not on file    Forced sexual activity: Not on file  Other Topics Concern  . Not on file  Social History Narrative  . Not on file    FAMILY HISTORY: Family History  Problem Relation Age of Onset  . Diabetes Other   .  Hyperlipidemia Other   . Hypertension Other   . Breast cancer Maternal Aunt 70  . Prostate cancer Maternal Uncle 75  . Lung cancer Paternal Uncle 24  . Stroke Maternal Grandfather 72  . Cancer Paternal Grandmother 23       dx cancer of pancreas and colon, unknown if separate primaries  . Heart Problems Paternal Grandfather        d. 40  . Prostate cancer Maternal Uncle 75  . Breast cancer Maternal Aunt 75  . Breast cancer Maternal Aunt 68  . Cervical cancer Maternal Aunt 68  . Pancreatic cancer Maternal Aunt 54       d. 58y; heavy smoker  . Colon cancer Paternal Uncle 71       s/p partial colectomy; dx. second colon cancer at age 70-64    ALLERGIES:  is allergic to penicillins.  MEDICATIONS:  Current Outpatient Medications  Medication Sig Dispense Refill  . dexamethasone (DECADRON) 4 MG tablet TAKE 2 TABLETS BY MOUTH WITH FOOD DAILY (START THE DAY AFTER CHEMOTHERAPY FOR 2 DAYS) 30 tablet 0  . Insulin Glargine (BASAGLAR KWIKPEN) 100 UNIT/ML SOPN Inject 70 Units into the skin every morning.     . lidocaine-prilocaine (EMLA) cream Apply to affected area once 30 g 3  . Multiple Vitamin (MULTIVITAMIN) tablet Take 1 tablet by mouth daily.    . ondansetron (ZOFRAN) 8 MG tablet Take 1 tablet (8 mg total) by mouth 2 (two) times daily as needed for refractory nausea / vomiting. Start on day 3 after chemotherapy. 30 tablet 1  . prochlorperazine (COMPAZINE) 10 MG tablet TAKE 1 TABLET BY MOUTH EVERY 6 HOURS AS NEEDED FOR NAUSEA / VOMITING 30 tablet 1  . traMADol (ULTRAM) 50 MG tablet TAKE 1 TABLET BY MOUTH EVERY 6 HOURS AS NEEDED FOR MODERATE / SEVERE PAIN 60 tablet 0   No current facility-administered medications for this visit.     REVIEW OF SYSTEMS:   A 10+ POINT REVIEW OF SYSTEMS WAS OBTAINED including neurology, dermatology, psychiatry, cardiac, respiratory, lymph, extremities, GI, GU, Musculoskeletal, constitutional, breasts, reproductive, HEENT.  All pertinent positives are noted in  the HPI.  All others are negative.    PHYSICAL EXAMINATION:  ECOG PERFORMANCE STATUS: 1 - Symptomatic but completely ambulatory  GENERAL:alert, in no acute distress and comfortable SKIN: no acute rashes, no significant lesions EYES: conjunctiva are pink and non-injected, sclera anicteric OROPHARYNX: MMM, no exudates, no oropharyngeal erythema or ulceration  NECK: supple, no JVD LYMPH:  no palpable lymphadenopathy in the cervical, axillary or inguinal regions LUNGS: clear to auscultation b/l with normal respiratory effort HEART: regular rate & rhythm ABDOMEN:  normoactive bowel sounds , non tender, not distended. Extremity: no pedal edema PSYCH: alert & oriented x 3 with fluent speech NEURO: no focal motor/sensory deficits   LABORATORY DATA:  I have reviewed the data as listed  .Scott Gallagher CBC Latest Ref Rng & Units 01/20/2018 01/06/2018 12/23/2017  WBC 4.0 - 10.3 K/uL 6.4 5.2 7.6  Hemoglobin 13.0 - 17.1 g/dL 14.2 14.5 14.6  Hematocrit 38.4 - 49.9 % 42.1 42.9 43.3  Platelets 140 - 400 K/uL 136(L) 168 138(L)   . CMP Latest Ref Rng & Units 01/20/2018 01/06/2018 12/23/2017  Glucose 70 - 140 mg/dL 305(H) 189(H) 243(H)  BUN 7 - 26 mg/dL 15 14 14   Creatinine 0.70 - 1.30 mg/dL 1.07 0.86 0.70  Sodium 136 - 145 mmol/L 136 140 137  Potassium 3.5 - 5.1 mmol/L 4.3 3.9 4.1  Chloride 98 - 109 mmol/L 103 104 105  CO2 22 - 29 mmol/L 24 25 23   Calcium 8.4 - 10.4 mg/dL 9.1 9.8 8.8(L)  Total Protein 6.4 - 8.3 g/dL 6.7 7.0 6.6  Total Bilirubin 0.2 - 1.2 mg/dL 0.7 0.6 0.9  Alkaline Phos 40 - 150 U/L 122 113 86  AST 5 - 34 U/L 21 24 23   ALT 0 - 55 U/L 31 29 43   .       Microscopic Comment 2. COLON AND RECTUM (INCLUDING TRANS-ANAL RESECTION): Specimen: Terminal ileum, right colon and appendix Procedure: Segmental resection Tumor site: Proximal ascending colon Specimen integrity: Intact Macroscopic intactness of mesorectum: Not applicable: x Complete: NA Near complete: NA Incomplete: NA Cannot be  determined (specify): NA Macroscopic tumor perforation: The mass invades through muscularis propria into pericolonic soft tissue Invasive tumor: Maximum size: 5.5 cm Histologic type(s): Adenocarcinoma Histologic grade and differentiation: G3 G1: well differentiated/low grade 1 of 4 Supplemental copy SUPPLEMENTAL for Masse, Fields ([id number]) Microscopic Comment(continued) G2: moderately differentiated/low grade G3: poorly differentiated/high grade G4: undifferentiated/high grade Type of polyp in which invasive carcinoma arose: Tubular adenoma Microscopic extension of invasive tumor: The mass invade through the muscularis propria into pericolonic soft tissue Lymph-Vascular invasion: Identified Peri-neural invasion: Identified Tumor deposit(s) (discontinuous extramural extension): Present Resection margins: Proximal margin: Negative Distal margin: Negative Circumferential (radial) (posterior ascending, posterior descending; lateral and posterior mid-rectum; and entire lower 1/3 rectum):Negative Mesenteric margin (sigmoid and transverse): NA Distance closest margin (if all above margins negative): 3.7 cm from the non peritonealized pericolic soft tissue margin Trans-anal resection margins only: Deep margin: NA Mucosal Margin: NA Distance closest mucosal margin (if negative): NA Treatment effect (neo-adjuvant therapy): NA Additional polyp(s): Negative Non-neoplastic findings: Unremarkable Lymph nodes: number examined 18; number positive: 5 Pathologic Staging: pT3, N2a, M1a Ancillary studies: MSI ordered      RADIOGRAPHIC STUDIES: I have personally reviewed the radiological images as listed and agreed with the findings in the report. CT abd/pelvis 12/09/2017: IMPRESSION: 1. Heterogeneous hepatic steatosis. Given this mild limitation, no evidence of progressive hepatic metastasis. Similar nonspecific capsular irregularity along the inferior right hepatic lobe. 2. Suspect  developing omental/peritoneal metastasis, as evidenced by new and increased omental nodularity. Trace cul-de-sac fluid is nonspecific but could also be secondary. 3.  No acute process or evidence of metastatic disease in the chest. 4. Age advanced coronary artery atherosclerosis. Recommend assessment of coronary risk factors and consideration of medical therapy. 5.  Aortic Atherosclerosis (ICD10-I70.0).  6. Possible developing cirrhosis.  Correlate with risk factors.   Electronically Signed   By: Abigail Miyamoto M.D.   On: 12/09/2017 14:23    MR LIVER WWO CONTRAST2/14/2019 [facility] Result Impression   1.Decreased sizes of multiple T2 hyperintense metastatic lesions (in the setting of patient's known colon adenocarcinoma with mucinous features) in both hepatic lobes (the majority of which are tiny). 2.No new lesions. 3.Posttreatment changes within the right hepatic lobe. 4.Other findings, as detailed above.  Result Narrative  MR LIVER WITH AND WITHOUT CONTRAST, 09/30/2017 10:20 AM  INDICATION: liver malignancy \ liver malignancy \ C18.9 Metastatic colon cancer to liver (HCC) \ C78.7 Metastatic colon cancer to liver ([place])  ADDITIONAL HISTORY: None. COMPARISON: MRI abdomen dated 07/01/2017.  TECHNIQUE: Multiplanar, multisequence MR images of the upper abdomen were obtained before and after intravenous administration of gadolinium-based contrast.   FINDINGS:  LOWER CHEST Heart: Normal size. No pericardial effusion. Lungs/pleura: No masses, consolidations, or effusions.  ABDOMEN Liver: Changes of prior radioembolization in the right hepatic lobe. Perfusional anomaly in the posterior right hepatic lobe, likely a venovenous shunt. Decreased sizes of multiple T2 hyperintense metastatic lesions throughout both hepatic lobes (the majority of which are tiny and are marked in PACS on series 5), with the dominant lesion measuring 10 mm in segment 8 (series  5, image 19), versus 12 mm previously. No new lesions. Gallbladder: Normal. No stones or inflammatory changes. Biliary: No obstruction. Spleen: Splenomegaly, with the spleen measuring 15.2 cm in span. Pancreas: Normal. Adrenals: Normal. Kidneys: No masses, stones, or obstruction. Unchanged fluid signal lesions in both kidneys, measuring up to 2.0 cm in the interpolar region of the left kidney. Stomach/bowel: Unremarkable.    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 KRAS mutated BRAF, NRAS mutation neg MSI Stable.  PET/CT scan done on 06/25/2016 Show multiple foci of hypermetabolic hepatic metastases (atleast 4-5) and 2 hypermetabolic peritoneal nodules along the dorsal peritoneal surface concerning for peritoneal metastases.  MRI Liver 07/27/2016 - Multiple small liver metastases throughout the right and left hepatic lobes, largest measuring 2.1 cm. 1.2 cm enhancing peritoneal nodule in left paracolic gutter, suspicious for peritoneal metastasis. Other small peritoneal nodules better visualized on recent PET-CT which showed hypermetabolic activity, also suspicious for peritoneal metastases.   MRI Abd 10/12/2016 at [facility] --shows improvement in all the liver and the 2 peritoneal lesions and no new lesions.  MRI liver and CT C/A/P on 01/01/2017 as noted above showed improved/stable disease.   MRI Liver 07/01/2017 [facility] -  1.Three T2 hyperintense metastatic lesions are seen in the liver, with the largest again in segment 8. These appear unchanged from prior, again without appreciable enhancement or restricted diffusion. Several of the previously noted tiny lesions are no longer visualized. No new lesions identified.  2.Treatment related changes in the posterior right hepatic lobe. 3.A  peritoneal nodule adjacent to the splenic flexure is now more conspicuous compared to the prior study but appears similar to more remote imaging and again could relate to neoplastic disease, although is indeterminate.  CT Abd/pelvis 08/23/2017: Interval resolution of previously seen small right hepatic lobe lesion. Stable tiny sub-cm nodule along the capsular surface of the right hepatic lobe. No new or progressive disease identified. Stable hepatic steatosis with areas of fatty sparing.  MRI 09/30/17:  Decreased  sizes of multiple T2 hyperintense metastatic lesions (in the setting of patient's known colon adenocarcinoma with mucinous features) in both hepatic lobes (the majority of which are tiny). No new lesions. Posttreatment changes within the right hepatic lobe. Other findings, as detailed above.   12/09/17 CT C/A/P with pt which revealed Suspected developing omental/peritoneal metastasis, as evidenced by new and increased omental nodularity.  -CEA levels are slowing increasing and on 4/419 were increased to 11.87.  2) Mild thrombocytopenia PLT improved from 84k to 101k (already have held 5FU bolus dose).   PLAN  Discussed pt labwork today, 01/20/18; blood counts are stable, Glucose higher at 305 today, chemistries otherwise stable.  -Discussed the 12/23/17 CT Abdomen Pelvis which revealed evidence for peritoneal metastasis which has progressed since 08/23/2017. Post treatment changes in the liver with steatosis. No discrete liver lesions are appreciated on this examination --Continue follow up with Dr Kelby Fam on June 27 with MRI liver to evaluate for need for additional liver directed therapies. --given progression of peritoneal/omental disease with no other radiographic evidence of disease and controlled disease in the liver will refer pt back to Dr Crisoforo Oxford at [facility] for a discussion about role for ?intraperitoneal chemotherapy at this time. -if IP chemotherapy not considered might need to move to  2nd line systemic therapies in the near future. -Continue 5FU/Leucovorin maintenance therapy at this time. -Will see pt back in 4 weeks    4) Iron deficiency anemia due to GI bleeding from the tumor and some blood loss with surgery. 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 Counseling regarding advanced care planning and goals of care 05/27/2017  . 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 Genetic testing 10/30/2016  . Port catheter in place 09/25/2016  . Family history of colon cancer 07/24/2016  . Family history of prostate cancer 07/24/2016  . Metastatic colon cancer to liver ([place]) 07/01/2016  . Right Colon cancer metastasized to liver ([place]) 06/17/2016  . Chronic GI bleeding 05/29/2016  . Diabetes mellitus type 2, diet-controlled ([place]) 05/22/2016  . OSA (obstructive sleep apnea) 05/22/2016  . Hyperlipidemia 05/22/2016  . Anemia due to blood loss, chronic   . Antineoplastic chemotherapy induced pancytopenia (CODE) ([place]) 05/21/2016   Plan:  -Continue follow-up with primary care physician [name], MD for optimization of diabetes management - now on basal insulin with improving control.  -Continue use of CPAP for sleep apnea.  -Referral to Dr Jyl Heinz -- consideration of ?Intraperitoneal CHemotherapy  -Continue 5FU/LV maintenance - please schedule next 4 treatments with labs -RTC with Dr Irene Limbo every other treatment q4weeks (cancel other clinic appointments)      All of the patients questions were answered with apparent satisfaction. The patient knows to call the clinic with any problems, questions or concerns.  . The total time spent in the appointment was 25 minutes and more than 50% was on counseling and direct patient cares.     Sullivan Lone MD [place] AAHIVMS [facility] Hematology/Oncology Physician [facility]  (Office):       [phone] (Work cell):   [phone] (Fax):           [phone]  I, Baldwin [place], am acting as a scribe for Dr Irene Limbo.   .I have reviewed the above documentation for accuracy and completeness, and I agree with the above. Brunetta Genera MD

## 2018-01-20 ENCOUNTER — Other Ambulatory Visit: Payer: Self-pay

## 2018-01-20 ENCOUNTER — Inpatient Hospital Stay (HOSPITAL_BASED_OUTPATIENT_CLINIC_OR_DEPARTMENT_OTHER): Payer: 59 | Admitting: Hematology

## 2018-01-20 ENCOUNTER — Inpatient Hospital Stay: Payer: 59

## 2018-01-20 ENCOUNTER — Inpatient Hospital Stay: Payer: 59 | Attending: Hematology

## 2018-01-20 ENCOUNTER — Encounter: Payer: Self-pay | Admitting: Hematology

## 2018-01-20 VITALS — BP 124/78 | HR 81 | Temp 98.2°F | Resp 18 | Ht 74.0 in | Wt 261.1 lb

## 2018-01-20 DIAGNOSIS — Z87891 Personal history of nicotine dependence: Secondary | ICD-10-CM | POA: Insufficient documentation

## 2018-01-20 DIAGNOSIS — E119 Type 2 diabetes mellitus without complications: Secondary | ICD-10-CM | POA: Insufficient documentation

## 2018-01-20 DIAGNOSIS — C182 Malignant neoplasm of ascending colon: Secondary | ICD-10-CM

## 2018-01-20 DIAGNOSIS — G4733 Obstructive sleep apnea (adult) (pediatric): Secondary | ICD-10-CM | POA: Diagnosis not present

## 2018-01-20 DIAGNOSIS — Z7189 Other specified counseling: Secondary | ICD-10-CM

## 2018-01-20 DIAGNOSIS — Z79899 Other long term (current) drug therapy: Secondary | ICD-10-CM | POA: Insufficient documentation

## 2018-01-20 DIAGNOSIS — Z801 Family history of malignant neoplasm of trachea, bronchus and lung: Secondary | ICD-10-CM | POA: Insufficient documentation

## 2018-01-20 DIAGNOSIS — D500 Iron deficiency anemia secondary to blood loss (chronic): Secondary | ICD-10-CM | POA: Diagnosis not present

## 2018-01-20 DIAGNOSIS — E785 Hyperlipidemia, unspecified: Secondary | ICD-10-CM | POA: Insufficient documentation

## 2018-01-20 DIAGNOSIS — M1990 Unspecified osteoarthritis, unspecified site: Secondary | ICD-10-CM | POA: Diagnosis not present

## 2018-01-20 DIAGNOSIS — T451X5S Adverse effect of antineoplastic and immunosuppressive drugs, sequela: Secondary | ICD-10-CM | POA: Insufficient documentation

## 2018-01-20 DIAGNOSIS — C786 Secondary malignant neoplasm of retroperitoneum and peritoneum: Secondary | ICD-10-CM | POA: Diagnosis not present

## 2018-01-20 DIAGNOSIS — C189 Malignant neoplasm of colon, unspecified: Secondary | ICD-10-CM

## 2018-01-20 DIAGNOSIS — Z803 Family history of malignant neoplasm of breast: Secondary | ICD-10-CM | POA: Insufficient documentation

## 2018-01-20 DIAGNOSIS — Z9989 Dependence on other enabling machines and devices: Secondary | ICD-10-CM

## 2018-01-20 DIAGNOSIS — G4730 Sleep apnea, unspecified: Secondary | ICD-10-CM

## 2018-01-20 DIAGNOSIS — D61810 Antineoplastic chemotherapy induced pancytopenia: Secondary | ICD-10-CM | POA: Diagnosis not present

## 2018-01-20 DIAGNOSIS — C787 Secondary malignant neoplasm of liver and intrahepatic bile duct: Secondary | ICD-10-CM | POA: Diagnosis not present

## 2018-01-20 DIAGNOSIS — Z800 Family history of malignant neoplasm of digestive organs: Secondary | ICD-10-CM | POA: Diagnosis not present

## 2018-01-20 DIAGNOSIS — G629 Polyneuropathy, unspecified: Secondary | ICD-10-CM | POA: Insufficient documentation

## 2018-01-20 DIAGNOSIS — Z794 Long term (current) use of insulin: Secondary | ICD-10-CM | POA: Diagnosis not present

## 2018-01-20 LAB — CBC WITH DIFFERENTIAL/PLATELET
BASOS ABS: 0 10*3/uL (ref 0.0–0.1)
Basophils Relative: 1 %
Eosinophils Absolute: 0.2 10*3/uL (ref 0.0–0.5)
Eosinophils Relative: 2 %
HEMATOCRIT: 42.1 % (ref 38.4–49.9)
Hemoglobin: 14.2 g/dL (ref 13.0–17.1)
LYMPHS PCT: 13 %
Lymphs Abs: 0.8 10*3/uL — ABNORMAL LOW (ref 0.9–3.3)
MCH: 31.3 pg (ref 27.2–33.4)
MCHC: 33.7 g/dL (ref 32.0–36.0)
MCV: 92.9 fL (ref 79.3–98.0)
MONO ABS: 0.9 10*3/uL (ref 0.1–0.9)
Monocytes Relative: 13 %
NEUTROS ABS: 4.5 10*3/uL (ref 1.5–6.5)
Neutrophils Relative %: 71 %
Platelets: 136 10*3/uL — ABNORMAL LOW (ref 140–400)
RBC: 4.53 MIL/uL (ref 4.20–5.82)
RDW: 13.8 % (ref 11.0–14.6)
WBC: 6.4 10*3/uL (ref 4.0–10.3)

## 2018-01-20 LAB — CMP (CANCER CENTER ONLY)
ALBUMIN: 3.9 g/dL (ref 3.5–5.0)
ALT: 31 U/L (ref 0–55)
ANION GAP: 9 (ref 3–11)
AST: 21 U/L (ref 5–34)
Alkaline Phosphatase: 122 U/L (ref 40–150)
BILIRUBIN TOTAL: 0.7 mg/dL (ref 0.2–1.2)
BUN: 15 mg/dL (ref 7–26)
CO2: 24 mmol/L (ref 22–29)
Calcium: 9.1 mg/dL (ref 8.4–10.4)
Chloride: 103 mmol/L (ref 98–109)
Creatinine: 1.07 mg/dL (ref 0.70–1.30)
GFR, Est AFR Am: >60 mL/min (ref >60)
GFR, Estimated: >60 mL/min (ref >60)
GLUCOSE: 305 mg/dL — AB (ref 70–140)
POTASSIUM: 4.3 mmol/L (ref 3.5–5.1)
SODIUM: 136 mmol/L (ref 136–145)
TOTAL PROTEIN: 6.7 g/dL (ref 6.4–8.3)

## 2018-01-20 LAB — RETICULOCYTES
RBC.: 4.53 MIL/uL (ref 4.20–5.82)
RETIC COUNT ABSOLUTE: 68 10*3/uL (ref 34.8–93.9)
Retic Ct Pct: 1.5 % (ref 0.8–1.8)

## 2018-01-20 MED ORDER — SODIUM CHLORIDE 0.9 % IV SOLN
400.0000 mg/m2 | Freq: Once | INTRAVENOUS | Status: DC
  Filled 2018-01-20: qty 48.2

## 2018-01-20 MED ORDER — PALONOSETRON HCL INJECTION 0.25 MG/5ML
INTRAVENOUS | Status: AC
  Filled 2018-01-20: qty 5

## 2018-01-20 MED ORDER — DEXAMETHASONE SODIUM PHOSPHATE 10 MG/ML IJ SOLN
10.0000 mg | Freq: Once | INTRAMUSCULAR | Status: AC
  Administered 2018-01-20: 10 mg via INTRAVENOUS

## 2018-01-20 MED ORDER — DEXTROSE 5 % IV SOLN: Freq: Once | INTRAVENOUS | Status: DC

## 2018-01-20 MED ORDER — SODIUM CHLORIDE 0.9 % IV SOLN
400.0000 mg/m2 | Freq: Once | INTRAVENOUS | Status: AC
  Administered 2018-01-20: 964 mg via INTRAVENOUS
  Filled 2018-01-20: qty 48.2

## 2018-01-20 MED ORDER — DEXAMETHASONE SODIUM PHOSPHATE 10 MG/ML IJ SOLN
INTRAMUSCULAR | Status: AC
  Filled 2018-01-20: qty 1

## 2018-01-20 MED ORDER — SODIUM CHLORIDE 0.9 % IV SOLN
INTRAVENOUS | Status: DC
  Administered 2018-01-20: 10:00:00 via INTRAVENOUS

## 2018-01-20 MED ORDER — SODIUM CHLORIDE 0.9 % IV SOLN
2400.0000 mg/m2 | INTRAVENOUS | Status: DC
  Administered 2018-01-20: 5800 mg via INTRAVENOUS
  Filled 2018-01-20: qty 116

## 2018-01-20 MED ORDER — PALONOSETRON HCL INJECTION 0.25 MG/5ML
0.2500 mg | Freq: Once | INTRAVENOUS | Status: AC
  Administered 2018-01-20: 0.25 mg via INTRAVENOUS

## 2018-01-20 NOTE — Patient Instructions (Signed)
[facility] Discharge Instructions for Patients Receiving Chemotherapy  Today you received the following chemotherapy agents: Leucovorin, Adrucil  To help prevent nausea and vomiting after your treatment, we encourage you to take your nausea medication as directed  If you develop nausea and vomiting that is not controlled by your nausea medication, call the clinic.   BELOW ARE SYMPTOMS THAT SHOULD BE REPORTED IMMEDIATELY:  *FEVER GREATER THAN 100.5 F  *CHILLS WITH OR WITHOUT FEVER  NAUSEA AND VOMITING THAT IS NOT CONTROLLED WITH YOUR NAUSEA MEDICATION  *UNUSUAL SHORTNESS OF BREATH  *UNUSUAL BRUISING OR BLEEDING  TENDERNESS IN MOUTH AND THROAT WITH OR WITHOUT PRESENCE OF ULCERS  *URINARY PROBLEMS  *BOWEL PROBLEMS  UNUSUAL RASH Items with * indicate a potential emergency and should be followed up as soon as possible.  Feel free to call the clinic should you have any questions or concerns. The clinic phone number is [phone].  Please show the CHEMO ALERT CARD at check-in to the Emergency Department and triage nurse.   

## 2018-01-22 ENCOUNTER — Inpatient Hospital Stay: Payer: 59

## 2018-01-22 VITALS — BP 123/80 | HR 77 | Temp 98.6°F | Resp 17

## 2018-01-22 DIAGNOSIS — C182 Malignant neoplasm of ascending colon: Secondary | ICD-10-CM | POA: Diagnosis not present

## 2018-01-22 DIAGNOSIS — C787 Secondary malignant neoplasm of liver and intrahepatic bile duct: Secondary | ICD-10-CM

## 2018-01-22 DIAGNOSIS — Z7189 Other specified counseling: Secondary | ICD-10-CM

## 2018-01-22 DIAGNOSIS — C189 Malignant neoplasm of colon, unspecified: Secondary | ICD-10-CM

## 2018-01-22 MED ORDER — HEPARIN SOD (PORK) LOCK FLUSH 100 UNIT/ML IV SOLN
500.0000 [IU] | Freq: Once | INTRAVENOUS | Status: AC | PRN
  Administered 2018-01-22: 500 [IU]
  Filled 2018-01-22: qty 5

## 2018-01-22 MED ORDER — SODIUM CHLORIDE 0.9% FLUSH
10.0000 mL | INTRAVENOUS | Status: DC | PRN
  Administered 2018-01-22: 10 mL
  Filled 2018-01-22: qty 10

## 2018-02-02 NOTE — Progress Notes (Signed)
Scott Gallagher    HEMATOLOGY/ONCOLOGY CLINIC NOTE  Date of Service: 02/03/18  Patient Care Team: [name], MD as PCP - General (Family Medicine) [name] M.D. 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 KRAS mutated BRAF, NRAS mutation neg MSI Stable.  PET/CT scan done on 06/25/2016 Show multiple foci of hypermetabolic hepatic metastases (atleast 4-5) and 2 hypermetabolic peritoneal nodules along the dorsal peritoneal surface concerning for peritoneal metastases.  MRI Liver 07/27/2016 - Multiple small liver metastases throughout the right and left hepatic lobes, largest measuring 2.1 cm. 1.2 cm enhancing peritoneal nodule in left paracolic gutter, suspicious for peritoneal metastasis. Other small peritoneal nodules better visualized on recent PET-CT which showed hypermetabolic activity, also suspicious for peritoneal metastases.   CT CHEST WO CONTRAST 10/12/2016  [facility] Result Impression   1. Congenital variant of bilateral middle lobes. 2. Groundglass opacification with regions of bronchiectasis in the right lower lobe adjacent to the fissure, consistent with inflammatory/infectious changes. 3. No definitive evidence of metastatic disease to the chest. 4. Probable sebaceous cyst in the subcutaneous tissues beneath the left anterior chest.   MR ABDOMEN AND PELVIS JKDTOIZT FOLLOW UP2/26/2018 [facility] Result Impression    Decreased size of multiple hepatic lesions and two peritoneal nodules compared to outside MRI 07/27/2016. No new lesions identified in the  abdomen or pelvis.     PREVIOUS TREATMENT  FOLFOX x 10 cycles. 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 (without 5FU bolus - due to thrombocytopenia). S/p Y-90 treatment to 1 hepatic lobe.  Plan for prn hepatic Y90 radio-embolization   INTERVAL HISTORY   Scott Gallagher is here for follow-up for his metastatic colon cancer and for next cycle of 5FU/leucovorin for maintenance therapy. The patient's last visit with [place] was on 01/20/18. He is accompanied today by his wife. The pt reports that he is doing well overall.   The pt reports that he has not yet seen Dr Crisoforo Oxford and will see Dr Kelby Fam with a liver MRI next week. He notes that he gets some right upper quadrant pain/discomfort intermitently, which worsens when he breathes deeply. He has checked his temperature when this pain presents, and has found that each time his temperature is in the 99s. He denies the pain as being tender to the touch, but the pain arises from within his abdomen.   Lab results today (02/03/18) of CBC, CMP, and Reticulocytes is as follows: all values are WNL except for PLT at 124k, Glucose at 206. CEA 02/03/18 is increased to 27.26  On review of systems, pt reports good energy levels, intermittent RUQ pain, and denies changes in his neuropathy, abdominal pains, fevers, chills, night sweats, and any other symptoms.     MEDICAL HISTORY:  Past Medical History:  Diagnosis Date  . Anemia   . Arthritis   . Colon cancer ([place]) dx'd 05/2016  . Diabetes mellitus without complication (HCC)    diet controlled  . History of blood transfusion   . Hyperlipidemia   . liver  mets dx'd 05/2016  . Sleep apnea    cpap  . Wears glasses     SURGICAL HISTORY: Past Surgical History:  Procedure Laterality Date  . COLON SURGERY    . IR GENERIC HISTORICAL  09/24/2016   IR CV LINE INJECTION  09/24/2016 [facility]  . IR GENERIC HISTORICAL  09/24/2016   IR US GUIDE VASC ACCESS RIGHT 09/24/2016 [facility]  . IR GENERIC HISTORICAL  09/24/2016   IR FLUORO GUIDE CV LINE RIGHT 09/24/2016 [facility]  . IR GENERIC HISTORICAL  09/30/2016   IR FLUORO GUIDE PORT INSERTION RIGHT 09/30/2016 Markus Daft, MD [facility]  . IR GENERIC HISTORICAL  09/30/2016   IR US GUIDE VASC ACCESS RIGHT 09/30/2016 Markus Daft, MD [facility]  . IR GENERIC HISTORICAL  09/30/2016   IR REMOVAL TUN ACCESS W/ PORT W/O FL MOD SED 09/30/2016 Markus Daft, MD [facility]  . LAPAROSCOPIC RIGHT HEMI COLECTOMY Right 06/17/2016   Procedure: LAPAROSCOPIC ASSISTED  RIGHT HEMI COLECTOMY;  Surgeon: [name], MD;  Location: [facility];  Service: General;  Laterality: Right;  . PORTACATH PLACEMENT Left 07/13/2016   Procedure: INSERTION PORT-A-CATH left subclavian;  Surgeon: [name], MD;  Location: [facility];  Service: General;  Laterality: Left;  . SHOULDER ACROMIOPLASTY Right 12/20/2014   Procedure: SHOULDER ACROMIOPLASTY;  Surgeon: Melrose Nakayama, MD;  Location: [place];  Service: Orthopedics;  Laterality: Right;  . SHOULDER ARTHROSCOPY Right 12/20/2014   Procedure: RIGHT ARTHROSCOPY SHOULDER WITH DEBRIDEMENT;  Surgeon: Melrose Nakayama, MD;  Location: [place];  Service: Orthopedics;  Laterality: Right;  . TESTICLE SURGERY     as teen  . TONSILLECTOMY    . WISDOM TOOTH EXTRACTION      SOCIAL HISTORY: Social History   Socioeconomic History  . Marital status: Married    Spouse name: Not on file  . Number of children: Not on file  . Years of education: Not on file  . Highest education level: Not on file  Occupational History  . Occupation: cemetery maintenance  Social Needs  . Financial resource strain: Not on file  . Food insecurity:    Worry: Not on file    Inability: Not on file  . Transportation needs:    Medical: Not on file    Non-medical: Not on file  Tobacco Use  . Smoking  status: Former Smoker    Packs/day: 1.50    Years: 29.00    Pack years: 43.50    Types: Cigarettes    Last attempt to quit: 12/16/2012    Years since quitting: 5.1  . Smokeless tobacco: Never Used  . Tobacco comment: 1-2 ppd from age [age]y to 2014  Substance and Sexual Activity  . Alcohol use: Yes    Alcohol/week: 1.8 oz    Types: 3 Shots of liquor per week    Comment:  some days  . Drug use: No  . Sexual activity: Not on file  Lifestyle  . Physical activity:    Days per week: Not on file    Minutes per session: Not on file  . Stress: Not on file  Relationships  . Social connections:    Talks on phone: Not on file    Gets together: Not on file    Attends religious service: Not on file    Active member of club or organization: Not on file    Attends meetings of clubs or organizations: Not on file    Relationship status: Not on file  .  Intimate partner violence:    Fear of current or ex partner: Not on file    Emotionally abused: Not on file    Physically abused: Not on file    Forced sexual activity: Not on file  Other Topics Concern  . Not on file  Social History Narrative  . Not on file    FAMILY HISTORY: Family History  Problem Relation Age of Onset  . Diabetes Other   . Hyperlipidemia Other   . Hypertension Other   . Breast cancer Maternal Aunt 70  . Prostate cancer Maternal Uncle 75  . Lung cancer Paternal Uncle 77  . Stroke Maternal Grandfather 72  . Cancer Paternal Grandmother 66       dx cancer of pancreas and colon, unknown if separate primaries  . Heart Problems Paternal Grandfather        d. 61  . Prostate cancer Maternal Uncle 55  . Breast cancer Maternal Aunt 75  . Breast cancer Maternal Aunt 68  . Cervical cancer Maternal Aunt 68  . Pancreatic cancer Maternal Aunt 54       d. 58y; heavy smoker  . Colon cancer Paternal Uncle 7       s/p partial colectomy; dx. second colon cancer at age 35-64    ALLERGIES:  is allergic to  penicillins.  MEDICATIONS:  Current Outpatient Medications  Medication Sig Dispense Refill  . dexamethasone (DECADRON) 4 MG tablet TAKE 2 TABLETS BY MOUTH WITH FOOD DAILY (START THE DAY AFTER CHEMOTHERAPY FOR 2 DAYS) 30 tablet 0  . Insulin Glargine (BASAGLAR KWIKPEN) 100 UNIT/ML SOPN Inject 70 Units into the skin every morning.     . lidocaine-prilocaine (EMLA) cream Apply to affected area once 30 g 3  . Multiple Vitamin (MULTIVITAMIN) tablet Take 1 tablet by mouth daily.    . ondansetron (ZOFRAN) 8 MG tablet Take 1 tablet (8 mg total) by mouth 2 (two) times daily as needed for refractory nausea / vomiting. Start on day 3 after chemotherapy. 30 tablet 1  . prochlorperazine (COMPAZINE) 10 MG tablet TAKE 1 TABLET BY MOUTH EVERY 6 HOURS AS NEEDED FOR NAUSEA / VOMITING 30 tablet 1  . traMADol (ULTRAM) 50 MG tablet TAKE 1 TABLET BY MOUTH EVERY 6 HOURS AS NEEDED FOR MODERATE / SEVERE PAIN 60 tablet 0   No current facility-administered medications for this visit.     REVIEW OF SYSTEMS:   A 10+ POINT REVIEW OF SYSTEMS WAS OBTAINED including neurology, dermatology, psychiatry, cardiac, respiratory, lymph, extremities, GI, GU, Musculoskeletal, constitutional, breasts, reproductive, HEENT.  All pertinent positives are noted in the HPI.  All others are negative.    PHYSICAL EXAMINATION:  ECOG PERFORMANCE STATUS: 1 - Symptomatic but completely ambulatory  GENERAL:alert, in no acute distress and comfortable SKIN: no acute rashes, no significant lesions EYES: conjunctiva are pink and non-injected, sclera anicteric OROPHARYNX: MMM, no exudates, no oropharyngeal erythema or ulceration NECK: supple, no JVD LYMPH:  no palpable lymphadenopathy in the cervical, axillary or inguinal regions LUNGS: clear to auscultation b/l with normal respiratory effort HEART: regular rate & rhythm ABDOMEN:  normoactive bowel sounds , non tender, not distended. Extremity: no pedal edema PSYCH: alert & oriented x 3 with  fluent speech NEURO: no focal motor/sensory deficits   LABORATORY DATA:  I have reviewed the data as listed  .Scott Gallagher CBC Latest Ref Rng & Units 02/03/2018 01/20/2018 01/06/2018  WBC 4.0 - 10.3 K/uL 6.4 6.4 5.2  Hemoglobin 13.0 - 17.1 g/dL 14.0 14.2 14.5  Hematocrit  38.4 - 49.9 % 42.2 42.1 42.9  Platelets 140 - 400 K/uL 124(L) 136(L) 168   . CMP Latest Ref Rng & Units 02/03/2018 01/20/2018 01/06/2018  Glucose 70 - 140 mg/dL 206(H) 305(H) 189(H)  BUN 7 - 26 mg/dL 10 15 14   Creatinine 0.70 - 1.30 mg/dL 0.91 1.07 0.86  Sodium 136 - 145 mmol/L 141 136 140  Potassium 3.5 - 5.1 mmol/L 4.1 4.3 3.9  Chloride 98 - 109 mmol/L 104 103 104  CO2 22 - 29 mmol/L 27 24 25   Calcium 8.4 - 10.4 mg/dL 9.9 9.1 9.8  Total Protein 6.4 - 8.3 g/dL 6.9 6.7 7.0  Total Bilirubin 0.2 - 1.2 mg/dL 0.8 0.7 0.6  Alkaline Phos 40 - 150 U/L 126 122 113  AST 5 - 34 U/L 26 21 24   ALT 0 - 55 U/L 42 31 29   .       Microscopic Comment 2. COLON AND RECTUM (INCLUDING TRANS-ANAL RESECTION): Specimen: Terminal ileum, right colon and appendix Procedure: Segmental resection Tumor site: Proximal ascending colon Specimen integrity: Intact Macroscopic intactness of mesorectum: Not applicable: x Complete: NA Near complete: NA Incomplete: NA Cannot be determined (specify): NA Macroscopic tumor perforation: The mass invades through muscularis propria into pericolonic soft tissue Invasive tumor: Maximum size: 5.5 cm Histologic type(s): Adenocarcinoma Histologic grade and differentiation: G3 G1: well differentiated/low grade 1 of 4 Supplemental copy SUPPLEMENTAL for Narayan, [place] ([id number]) Microscopic Comment(continued) G2: moderately differentiated/low grade G3: poorly differentiated/high grade G4: undifferentiated/high grade Type of polyp in which invasive carcinoma arose: Tubular adenoma Microscopic extension of invasive tumor: The mass invade through the muscularis propria into pericolonic soft  tissue Lymph-Vascular invasion: Identified Peri-neural invasion: Identified Tumor deposit(s) (discontinuous extramural extension): Present Resection margins: Proximal margin: Negative Distal margin: Negative Circumferential (radial) (posterior ascending, posterior descending; lateral and posterior mid-rectum; and entire lower 1/3 rectum):Negative Mesenteric margin (sigmoid and transverse): NA Distance closest margin (if all above margins negative): 3.7 cm from the non peritonealized pericolic soft tissue margin Trans-anal resection margins only: Deep margin: NA Mucosal Margin: NA Distance closest mucosal margin (if negative): NA Treatment effect (neo-adjuvant therapy): NA Additional polyp(s): Negative Non-neoplastic findings: Unremarkable Lymph nodes: number examined 18; number positive: 5 Pathologic Staging: pT3, N2a, M1a Ancillary studies: MSI ordered      RADIOGRAPHIC STUDIES: I have personally reviewed the radiological images as listed and agreed with the findings in the report. CT abd/pelvis 12/09/2017: IMPRESSION: 1. Heterogeneous hepatic steatosis. Given this mild limitation, no evidence of progressive hepatic metastasis. Similar nonspecific capsular irregularity along the inferior right hepatic lobe. 2. Suspect developing omental/peritoneal metastasis, as evidenced by new and increased omental nodularity. Trace cul-de-sac fluid is nonspecific but could also be secondary. 3.  No acute process or evidence of metastatic disease in the chest. 4. Age advanced coronary artery atherosclerosis. Recommend assessment of coronary risk factors and consideration of medical therapy. 5.  Aortic Atherosclerosis (ICD10-I70.0). 6. Possible developing cirrhosis.  Correlate with risk factors.   Electronically Signed   By: Abigail Miyamoto M.D.   On: 12/09/2017 14:23    MR LIVER WWO CONTRAST2/14/2019 [facility] Result Impression   1.Decreased sizes of  multiple T2 hyperintense metastatic lesions (in the setting of patient's known colon adenocarcinoma with mucinous features) in both hepatic lobes (the majority of which are tiny). 2.No new lesions. 3.Posttreatment changes within the right hepatic lobe. 4.Other findings, as detailed above.  Result Narrative  MR LIVER WITH AND WITHOUT CONTRAST, 09/30/2017 10:20 AM  INDICATION: liver  malignancy \ liver malignancy \ C18.9 Metastatic colon cancer to liver ([place]) \ C78.7 Metastatic colon cancer to liver ([place])  ADDITIONAL HISTORY: None. COMPARISON: MRI abdomen dated 07/01/2017.  TECHNIQUE: Multiplanar, multisequence MR images of the upper abdomen were obtained before and after intravenous administration of gadolinium-based contrast.   FINDINGS:  LOWER CHEST Heart: Normal size. No pericardial effusion. Lungs/pleura: No masses, consolidations, or effusions.  ABDOMEN Liver: Changes of prior radioembolization in the right hepatic lobe. Perfusional anomaly in the posterior right hepatic lobe, likely a venovenous shunt. Decreased sizes of multiple T2 hyperintense metastatic lesions throughout both hepatic lobes (the majority of which are tiny and are marked in PACS on series 5), with the dominant lesion measuring 10 mm in segment 8 (series 5, image 19), versus 12 mm previously. No new lesions. Gallbladder: Normal. No stones or inflammatory changes. Biliary: No obstruction. Spleen: Splenomegaly, with the spleen measuring 15.2 cm in span. Pancreas: Normal. Adrenals: Normal. Kidneys: No masses, stones, or obstruction. Unchanged fluid signal lesions in both kidneys, measuring up to 2.0 cm in the interpolar region of the left kidney. Stomach/bowel: Unremarkable.    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 KRAS mutated BRAF, NRAS mutation neg MSI Stable.  PET/CT scan done on 06/25/2016 Show multiple foci of hypermetabolic hepatic metastases (atleast 4-5) and 2 hypermetabolic peritoneal nodules along the dorsal peritoneal surface concerning for peritoneal metastases.  MRI Liver 07/27/2016 - Multiple small liver metastases throughout the right and left hepatic lobes, largest measuring 2.1 cm. 1.2 cm enhancing peritoneal nodule in left paracolic gutter, suspicious for peritoneal metastasis. Other small peritoneal nodules better visualized on recent PET-CT which showed hypermetabolic activity, also suspicious for peritoneal metastases.   MRI Abd 10/12/2016 at [facility] --shows improvement in all the liver and the 2 peritoneal lesions and no new lesions.  MRI liver and CT C/A/P on 01/01/2017 as noted above showed improved/stable disease.   MRI Liver 07/01/2017 at [facility] -  1.Three T2 hyperintense metastatic lesions are seen in the liver, with the largest again in segment 8. These appear unchanged from prior, again without appreciable enhancement or restricted diffusion. Several of the previously noted tiny lesions are no longer visualized. No new lesions identified.  2.Treatment related changes in the posterior right hepatic lobe. 3.A peritoneal nodule adjacent to the splenic flexure is now more conspicuous compared to the prior study but appears similar to more remote imaging and again could relate to neoplastic disease, although is indeterminate.  CT Abd/pelvis 08/23/2017: Interval resolution of previously seen small right hepatic lobe lesion. Stable tiny sub-cm nodule along the capsular surface of the right hepatic lobe. No new or progressive disease identified. Stable hepatic steatosis with areas of fatty sparing.  MRI 09/30/17:  Decreased sizes of multiple T2 hyperintense metastatic lesions (in the setting of patient's known colon adenocarcinoma with mucinous features) in both hepatic  lobes (the majority of which are tiny). No new lesions. Posttreatment changes within the right hepatic lobe. Other findings, as detailed above.   12/09/17 CT C/A/P with pt which revealed Suspected developing omental/peritoneal metastasis, as evidenced by new and increased omental nodularity.  -CEA levels are slowing increasing and on 4/419 were increased to 11.87 and today are up to 27.26  2) Mild thrombocytopenia PLT improved from 124k today(already have held 5FU bolus dose).   PLAN  -  Discussed pt labwork today, 02/03/18; blood counts are stable --Continue follow up with Dr Kelby Fam on June 27 with MRI liver to evaluate for need for additional liver directed therapies. -Given progression of peritoneal/omental disease with no other radiographic evidence of disease and controlled disease in the liver - we have referred him back to Dr Crisoforo Oxford at [facility] for a discussion about role for ?intraperitoneal chemotherapy at this time given primarily peritoneal/omental progression. -if IP chemotherapy not considered might need to move to 2nd line systemic therapies in the near future. FOLIRI +/- Avastin  -Will see pt back in one month -The pt has no prohibitive toxicities from continuing maintenance 5FU/Leucovorin at this time.    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 -resolved .  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 Counseling regarding advanced care planning and goals of care 05/27/2017  . 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 Genetic testing 10/30/2016  . Port catheter in place 09/25/2016  . Family history of colon cancer 07/24/2016  . Family history of prostate cancer 07/24/2016  . Metastatic colon cancer to liver ([place]) 07/01/2016  . Right Colon cancer metastasized to liver ([place])  06/17/2016  . Chronic GI bleeding 05/29/2016  . Diabetes mellitus type 2, diet-controlled ([place]) 05/22/2016  . OSA (obstructive sleep apnea) 05/22/2016  . Hyperlipidemia 05/22/2016  . Anemia due to blood loss, chronic   . Antineoplastic chemotherapy induced pancytopenia (CODE) ([place]) 05/21/2016   Plan:  -Continue follow-up with primary care physician [name], MD for optimization of diabetes management - now on basal insulin with improving control.  -Continue use of CPAP for sleep apnea.  -Continue maintenance 5FU/LV q2weeks - please schedule next 4 treatments -RTC with Dr Irene Limbo in 4 weeks -labs q2weeks with each treatment    All of the patients questions were answered with apparent satisfaction. The patient knows to call the clinic with any problems, questions or concerns.  The toal time spent in the appt was 20 minutes and more than 50% was on counseling and direct patient cares.     Sullivan Lone MD [place] AAHIVMS [facility] Hematology/Oncology Physician [facility]  (Office):       [phone] (Work cell):  [phone] (Fax):           [phone]  I, Baldwin [place], am acting as a scribe for Dr Irene Limbo.   .I have reviewed the above documentation for accuracy and completeness, and I agree with the above. Brunetta Genera MD

## 2018-02-03 ENCOUNTER — Inpatient Hospital Stay: Payer: 59

## 2018-02-03 ENCOUNTER — Telehealth: Payer: Self-pay

## 2018-02-03 ENCOUNTER — Inpatient Hospital Stay (HOSPITAL_BASED_OUTPATIENT_CLINIC_OR_DEPARTMENT_OTHER): Payer: 59 | Admitting: Hematology

## 2018-02-03 ENCOUNTER — Encounter: Payer: Self-pay | Admitting: Hematology

## 2018-02-03 ENCOUNTER — Telehealth: Payer: Self-pay | Admitting: Hematology

## 2018-02-03 VITALS — BP 124/83 | HR 79 | Temp 98.8°F | Resp 18 | Ht 74.0 in | Wt 261.3 lb

## 2018-02-03 DIAGNOSIS — Z800 Family history of malignant neoplasm of digestive organs: Secondary | ICD-10-CM

## 2018-02-03 DIAGNOSIS — Z79899 Other long term (current) drug therapy: Secondary | ICD-10-CM

## 2018-02-03 DIAGNOSIS — D500 Iron deficiency anemia secondary to blood loss (chronic): Secondary | ICD-10-CM

## 2018-02-03 DIAGNOSIS — G629 Polyneuropathy, unspecified: Secondary | ICD-10-CM

## 2018-02-03 DIAGNOSIS — C786 Secondary malignant neoplasm of retroperitoneum and peritoneum: Secondary | ICD-10-CM | POA: Diagnosis not present

## 2018-02-03 DIAGNOSIS — Z801 Family history of malignant neoplasm of trachea, bronchus and lung: Secondary | ICD-10-CM

## 2018-02-03 DIAGNOSIS — C182 Malignant neoplasm of ascending colon: Secondary | ICD-10-CM

## 2018-02-03 DIAGNOSIS — Z794 Long term (current) use of insulin: Secondary | ICD-10-CM

## 2018-02-03 DIAGNOSIS — C189 Malignant neoplasm of colon, unspecified: Secondary | ICD-10-CM

## 2018-02-03 DIAGNOSIS — C787 Secondary malignant neoplasm of liver and intrahepatic bile duct: Secondary | ICD-10-CM

## 2018-02-03 DIAGNOSIS — T451X5S Adverse effect of antineoplastic and immunosuppressive drugs, sequela: Secondary | ICD-10-CM | POA: Diagnosis not present

## 2018-02-03 DIAGNOSIS — D61810 Antineoplastic chemotherapy induced pancytopenia: Secondary | ICD-10-CM

## 2018-02-03 DIAGNOSIS — M1990 Unspecified osteoarthritis, unspecified site: Secondary | ICD-10-CM | POA: Diagnosis not present

## 2018-02-03 DIAGNOSIS — E119 Type 2 diabetes mellitus without complications: Secondary | ICD-10-CM

## 2018-02-03 DIAGNOSIS — Z87891 Personal history of nicotine dependence: Secondary | ICD-10-CM

## 2018-02-03 DIAGNOSIS — Z803 Family history of malignant neoplasm of breast: Secondary | ICD-10-CM

## 2018-02-03 DIAGNOSIS — Z9989 Dependence on other enabling machines and devices: Secondary | ICD-10-CM | POA: Diagnosis not present

## 2018-02-03 DIAGNOSIS — E785 Hyperlipidemia, unspecified: Secondary | ICD-10-CM

## 2018-02-03 DIAGNOSIS — Z7189 Other specified counseling: Secondary | ICD-10-CM

## 2018-02-03 LAB — CBC WITH DIFFERENTIAL (CANCER CENTER ONLY)
BASOS PCT: 1 %
Basophils Absolute: 0 10*3/uL (ref 0.0–0.1)
EOS ABS: 0.2 10*3/uL (ref 0.0–0.5)
Eosinophils Relative: 4 %
HEMATOCRIT: 42.2 % (ref 38.4–49.9)
Hemoglobin: 14 g/dL (ref 13.0–17.1)
LYMPHS ABS: 1.1 10*3/uL (ref 0.9–3.3)
Lymphocytes Relative: 17 %
MCH: 30.6 pg (ref 27.2–33.4)
MCHC: 33.2 g/dL (ref 32.0–36.0)
MCV: 92.3 fL (ref 79.3–98.0)
MONO ABS: 0.6 10*3/uL (ref 0.1–0.9)
MONOS PCT: 10 %
NEUTROS ABS: 4.4 10*3/uL (ref 1.5–6.5)
Neutrophils Relative %: 68 %
Platelet Count: 124 10*3/uL — ABNORMAL LOW (ref 140–400)
RBC: 4.57 MIL/uL (ref 4.20–5.82)
RDW: 13.9 % (ref 11.0–14.6)
WBC Count: 6.4 10*3/uL (ref 4.0–10.3)

## 2018-02-03 LAB — CMP (CANCER CENTER ONLY)
ALBUMIN: 4.1 g/dL (ref 3.5–5.0)
ALK PHOS: 126 U/L (ref 40–150)
ALT: 42 U/L (ref 0–55)
AST: 26 U/L (ref 5–34)
Anion gap: 10 (ref 3–11)
BUN: 10 mg/dL (ref 7–26)
CALCIUM: 9.9 mg/dL (ref 8.4–10.4)
CO2: 27 mmol/L (ref 22–29)
CREATININE: 0.91 mg/dL (ref 0.70–1.30)
Chloride: 104 mmol/L (ref 98–109)
GFR, Est AFR Am: >60 mL/min (ref >60)
GFR, Estimated: >60 mL/min (ref >60)
GLUCOSE: 206 mg/dL — AB (ref 70–140)
Potassium: 4.1 mmol/L (ref 3.5–5.1)
SODIUM: 141 mmol/L (ref 136–145)
Total Bilirubin: 0.8 mg/dL (ref 0.2–1.2)
Total Protein: 6.9 g/dL (ref 6.4–8.3)

## 2018-02-03 LAB — RETICULOCYTES
RBC.: 4.57 MIL/uL (ref 4.20–5.82)
Retic Count, Absolute: 68.6 10*3/uL (ref 34.8–93.9)
Retic Ct Pct: 1.5 % (ref 0.8–1.8)

## 2018-02-03 LAB — CEA (IN HOUSE-CHCC): CEA (CHCC-In House): 27.26 ng/mL — ABNORMAL HIGH (ref 0.00–5.00)

## 2018-02-03 MED ORDER — PALONOSETRON HCL INJECTION 0.25 MG/5ML
INTRAVENOUS | Status: AC
  Filled 2018-02-03: qty 5

## 2018-02-03 MED ORDER — DEXTROSE 5 % IV SOLN: Freq: Once | INTRAVENOUS | Status: DC

## 2018-02-03 MED ORDER — DEXAMETHASONE SODIUM PHOSPHATE 10 MG/ML IJ SOLN
INTRAMUSCULAR | Status: AC
  Filled 2018-02-03: qty 1

## 2018-02-03 MED ORDER — DEXAMETHASONE SODIUM PHOSPHATE 10 MG/ML IJ SOLN
10.0000 mg | Freq: Once | INTRAMUSCULAR | Status: AC
  Administered 2018-02-03: 10 mg via INTRAVENOUS

## 2018-02-03 MED ORDER — SODIUM CHLORIDE 0.9 % IV SOLN
2400.0000 mg/m2 | INTRAVENOUS | Status: DC
  Administered 2018-02-03: 5800 mg via INTRAVENOUS
  Filled 2018-02-03: qty 116

## 2018-02-03 MED ORDER — PALONOSETRON HCL INJECTION 0.25 MG/5ML
0.2500 mg | Freq: Once | INTRAVENOUS | Status: AC
  Administered 2018-02-03: 0.25 mg via INTRAVENOUS

## 2018-02-03 MED ORDER — LEUCOVORIN CALCIUM INJECTION 350 MG
400.0000 mg/m2 | Freq: Once | INTRAVENOUS | Status: AC
  Administered 2018-02-03: 964 mg via INTRAVENOUS
  Filled 2018-02-03: qty 48.2

## 2018-02-03 NOTE — Telephone Encounter (Signed)
Scheduled appt per 6/20 los - unable to schedule all cycles - pt aware and will check my chart for additional appts per 6/20 los.

## 2018-02-03 NOTE — Patient Instructions (Signed)
[facility] Discharge Instructions for Patients Receiving Chemotherapy  Today you received the following chemotherapy agents: Leucovorin, Adrucil  To help prevent nausea and vomiting after your treatment, we encourage you to take your nausea medication as directed  If you develop nausea and vomiting that is not controlled by your nausea medication, call the clinic.   BELOW ARE SYMPTOMS THAT SHOULD BE REPORTED IMMEDIATELY:  *FEVER GREATER THAN 100.5 F  *CHILLS WITH OR WITHOUT FEVER  NAUSEA AND VOMITING THAT IS NOT CONTROLLED WITH YOUR NAUSEA MEDICATION  *UNUSUAL SHORTNESS OF BREATH  *UNUSUAL BRUISING OR BLEEDING  TENDERNESS IN MOUTH AND THROAT WITH OR WITHOUT PRESENCE OF ULCERS  *URINARY PROBLEMS  *BOWEL PROBLEMS  UNUSUAL RASH Items with * indicate a potential emergency and should be followed up as soon as possible.  Feel free to call the clinic should you have any questions or concerns. The clinic phone number is [phone].  Please show the CHEMO ALERT CARD at check-in to the Emergency Department and triage nurse.   

## 2018-02-03 NOTE — Telephone Encounter (Signed)
Per Dr. Irene Limbo appointment scheduled for patient with Dr. Jyl Heinz at [facility]. Patient made aware of appointment and confirmed he can make it. Records faxed to Dr. Lyda Jester office at [phone].

## 2018-02-05 ENCOUNTER — Inpatient Hospital Stay: Payer: 59

## 2018-02-05 VITALS — BP 122/80 | HR 70 | Temp 97.7°F | Resp 18

## 2018-02-05 DIAGNOSIS — Z7189 Other specified counseling: Secondary | ICD-10-CM

## 2018-02-05 DIAGNOSIS — C182 Malignant neoplasm of ascending colon: Secondary | ICD-10-CM | POA: Diagnosis not present

## 2018-02-05 DIAGNOSIS — C189 Malignant neoplasm of colon, unspecified: Secondary | ICD-10-CM

## 2018-02-05 DIAGNOSIS — C787 Secondary malignant neoplasm of liver and intrahepatic bile duct: Secondary | ICD-10-CM

## 2018-02-05 MED ORDER — HEPARIN SOD (PORK) LOCK FLUSH 100 UNIT/ML IV SOLN
500.0000 [IU] | Freq: Once | INTRAVENOUS | Status: AC | PRN
  Administered 2018-02-05: 500 [IU]
  Filled 2018-02-05: qty 5

## 2018-02-05 MED ORDER — SODIUM CHLORIDE 0.9% FLUSH
10.0000 mL | INTRAVENOUS | Status: DC | PRN
  Administered 2018-02-05: 10 mL
  Filled 2018-02-05: qty 10

## 2018-02-07 DIAGNOSIS — G4733 Obstructive sleep apnea (adult) (pediatric): Secondary | ICD-10-CM | POA: Diagnosis not present

## 2018-02-10 DIAGNOSIS — C189 Malignant neoplasm of colon, unspecified: Secondary | ICD-10-CM | POA: Diagnosis not present

## 2018-02-10 DIAGNOSIS — C787 Secondary malignant neoplasm of liver and intrahepatic bile duct: Secondary | ICD-10-CM | POA: Diagnosis not present

## 2018-02-11 DIAGNOSIS — C786 Secondary malignant neoplasm of retroperitoneum and peritoneum: Secondary | ICD-10-CM | POA: Diagnosis not present

## 2018-02-11 DIAGNOSIS — C189 Malignant neoplasm of colon, unspecified: Secondary | ICD-10-CM | POA: Diagnosis not present

## 2018-02-11 DIAGNOSIS — C801 Malignant (primary) neoplasm, unspecified: Secondary | ICD-10-CM | POA: Diagnosis not present

## 2018-02-14 DIAGNOSIS — E1165 Type 2 diabetes mellitus with hyperglycemia: Secondary | ICD-10-CM | POA: Diagnosis not present

## 2018-02-14 DIAGNOSIS — C189 Malignant neoplasm of colon, unspecified: Secondary | ICD-10-CM | POA: Diagnosis not present

## 2018-02-16 ENCOUNTER — Inpatient Hospital Stay: Payer: 59 | Attending: Hematology

## 2018-02-16 ENCOUNTER — Inpatient Hospital Stay: Payer: 59

## 2018-02-16 ENCOUNTER — Ambulatory Visit: Payer: 59 | Admitting: Hematology

## 2018-02-16 ENCOUNTER — Other Ambulatory Visit: Payer: Self-pay | Admitting: Hematology

## 2018-02-16 VITALS — BP 122/86 | HR 90 | Temp 98.4°F | Resp 17 | Ht 74.0 in | Wt 259.2 lb

## 2018-02-16 DIAGNOSIS — Z8042 Family history of malignant neoplasm of prostate: Secondary | ICD-10-CM | POA: Diagnosis not present

## 2018-02-16 DIAGNOSIS — C189 Malignant neoplasm of colon, unspecified: Secondary | ICD-10-CM

## 2018-02-16 DIAGNOSIS — Z452 Encounter for adjustment and management of vascular access device: Secondary | ICD-10-CM | POA: Insufficient documentation

## 2018-02-16 DIAGNOSIS — C7989 Secondary malignant neoplasm of other specified sites: Secondary | ICD-10-CM | POA: Insufficient documentation

## 2018-02-16 DIAGNOSIS — Z794 Long term (current) use of insulin: Secondary | ICD-10-CM | POA: Insufficient documentation

## 2018-02-16 DIAGNOSIS — Z7189 Other specified counseling: Secondary | ICD-10-CM

## 2018-02-16 DIAGNOSIS — Z5111 Encounter for antineoplastic chemotherapy: Secondary | ICD-10-CM | POA: Diagnosis not present

## 2018-02-16 DIAGNOSIS — R109 Unspecified abdominal pain: Secondary | ICD-10-CM | POA: Diagnosis not present

## 2018-02-16 DIAGNOSIS — E785 Hyperlipidemia, unspecified: Secondary | ICD-10-CM | POA: Diagnosis not present

## 2018-02-16 DIAGNOSIS — Z9989 Dependence on other enabling machines and devices: Secondary | ICD-10-CM | POA: Diagnosis not present

## 2018-02-16 DIAGNOSIS — M5030 Other cervical disc degeneration, unspecified cervical region: Secondary | ICD-10-CM | POA: Insufficient documentation

## 2018-02-16 DIAGNOSIS — Z803 Family history of malignant neoplasm of breast: Secondary | ICD-10-CM | POA: Diagnosis not present

## 2018-02-16 DIAGNOSIS — C787 Secondary malignant neoplasm of liver and intrahepatic bile duct: Secondary | ICD-10-CM | POA: Diagnosis not present

## 2018-02-16 DIAGNOSIS — E1140 Type 2 diabetes mellitus with diabetic neuropathy, unspecified: Secondary | ICD-10-CM | POA: Diagnosis not present

## 2018-02-16 DIAGNOSIS — C182 Malignant neoplasm of ascending colon: Secondary | ICD-10-CM | POA: Insufficient documentation

## 2018-02-16 DIAGNOSIS — G4733 Obstructive sleep apnea (adult) (pediatric): Secondary | ICD-10-CM | POA: Diagnosis not present

## 2018-02-16 DIAGNOSIS — Z87891 Personal history of nicotine dependence: Secondary | ICD-10-CM | POA: Insufficient documentation

## 2018-02-16 DIAGNOSIS — Z79899 Other long term (current) drug therapy: Secondary | ICD-10-CM | POA: Diagnosis not present

## 2018-02-16 DIAGNOSIS — K760 Fatty (change of) liver, not elsewhere classified: Secondary | ICD-10-CM | POA: Insufficient documentation

## 2018-02-16 DIAGNOSIS — Z95828 Presence of other vascular implants and grafts: Secondary | ICD-10-CM

## 2018-02-16 DIAGNOSIS — D500 Iron deficiency anemia secondary to blood loss (chronic): Secondary | ICD-10-CM | POA: Diagnosis not present

## 2018-02-16 DIAGNOSIS — Z800 Family history of malignant neoplasm of digestive organs: Secondary | ICD-10-CM | POA: Insufficient documentation

## 2018-02-16 LAB — COMPREHENSIVE METABOLIC PANEL
ALT: 35 U/L (ref 0–44)
ANION GAP: 7 (ref 5–15)
AST: 22 U/L (ref 15–41)
Albumin: 4 g/dL (ref 3.5–5.0)
Alkaline Phosphatase: 121 U/L (ref 38–126)
BILIRUBIN TOTAL: 0.7 mg/dL (ref 0.3–1.2)
BUN: 17 mg/dL (ref 6–20)
CO2: 29 mmol/L (ref 22–32)
Calcium: 9.6 mg/dL (ref 8.9–10.3)
Chloride: 103 mmol/L (ref 98–111)
Creatinine, Ser: 0.84 mg/dL (ref 0.61–1.24)
GFR calc Af Amer: >60 mL/min (ref >60)
Glucose, Bld: 192 mg/dL — ABNORMAL HIGH (ref 70–99)
POTASSIUM: 4.2 mmol/L (ref 3.5–5.1)
Sodium: 139 mmol/L (ref 135–145)
TOTAL PROTEIN: 6.7 g/dL (ref 6.5–8.1)

## 2018-02-16 LAB — CBC WITH DIFFERENTIAL (CANCER CENTER ONLY)
BASOS ABS: 0 10*3/uL (ref 0.0–0.1)
Basophils Relative: 0 %
EOS ABS: 0.2 10*3/uL (ref 0.0–0.5)
EOS PCT: 2 %
HCT: 41.4 % (ref 38.4–49.9)
Hemoglobin: 13.8 g/dL (ref 13.0–17.1)
Lymphocytes Relative: 13 %
Lymphs Abs: 0.9 10*3/uL (ref 0.9–3.3)
MCH: 30.7 pg (ref 27.2–33.4)
MCHC: 33.3 g/dL (ref 32.0–36.0)
MCV: 92.2 fL (ref 79.3–98.0)
Monocytes Absolute: 0.9 10*3/uL (ref 0.1–0.9)
Monocytes Relative: 12 %
Neutro Abs: 5.4 10*3/uL (ref 1.5–6.5)
Neutrophils Relative %: 73 %
PLATELETS: 144 10*3/uL (ref 140–400)
RBC: 4.49 MIL/uL (ref 4.20–5.82)
RDW: 14.2 % (ref 11.0–14.6)
WBC: 7.4 10*3/uL (ref 4.0–10.3)

## 2018-02-16 LAB — RETICULOCYTES
RBC.: 4.49 MIL/uL (ref 4.20–5.82)
RETIC COUNT ABSOLUTE: 71.8 10*3/uL (ref 34.8–93.9)
Retic Ct Pct: 1.6 % (ref 0.8–1.8)

## 2018-02-16 MED ORDER — SODIUM CHLORIDE 0.9 % IV SOLN
2400.0000 mg/m2 | INTRAVENOUS | Status: DC
  Administered 2018-02-16: 5800 mg via INTRAVENOUS
  Filled 2018-02-16: qty 116

## 2018-02-16 MED ORDER — SODIUM CHLORIDE 0.9% FLUSH
10.0000 mL | INTRAVENOUS | Status: DC | PRN
  Administered 2018-02-16: 10 mL via INTRAVENOUS
  Filled 2018-02-16: qty 10

## 2018-02-16 MED ORDER — DEXAMETHASONE SODIUM PHOSPHATE 10 MG/ML IJ SOLN
INTRAMUSCULAR | Status: AC
  Filled 2018-02-16: qty 1

## 2018-02-16 MED ORDER — DEXTROSE 5 % IV SOLN: Freq: Once | INTRAVENOUS | Status: DC

## 2018-02-16 MED ORDER — PALONOSETRON HCL INJECTION 0.25 MG/5ML
INTRAVENOUS | Status: AC
Start: 2018-02-16 — End: ?
  Filled 2018-02-16: qty 5

## 2018-02-16 MED ORDER — DEXAMETHASONE SODIUM PHOSPHATE 10 MG/ML IJ SOLN
10.0000 mg | Freq: Once | INTRAMUSCULAR | Status: AC
  Administered 2018-02-16: 10 mg via INTRAVENOUS

## 2018-02-16 MED ORDER — PALONOSETRON HCL INJECTION 0.25 MG/5ML
0.2500 mg | Freq: Once | INTRAVENOUS | Status: AC
  Administered 2018-02-16: 0.25 mg via INTRAVENOUS

## 2018-02-16 MED ORDER — LEUCOVORIN CALCIUM INJECTION 350 MG
400.0000 mg/m2 | Freq: Once | INTRAMUSCULAR | Status: AC
  Administered 2018-02-16: 964 mg via INTRAVENOUS
  Filled 2018-02-16: qty 48.2

## 2018-02-16 NOTE — Patient Instructions (Signed)
[facility] Discharge Instructions for Patients Receiving Chemotherapy  Today you received the following chemotherapy agents: Leucovorin and Fluorouracil (Adrucil, 5-FU).   To help prevent nausea and vomiting after your treatment, we encourage you to take your nausea medication as prescribed. Received Aloxi during treatment today-->Take Compazine (not Zofran) for the next 3 days as needed.   If you develop nausea and vomiting that is not controlled by your nausea medication, call the clinic.   BELOW ARE SYMPTOMS THAT SHOULD BE REPORTED IMMEDIATELY:  *FEVER GREATER THAN 100.5 F  *CHILLS WITH OR WITHOUT FEVER  NAUSEA AND VOMITING THAT IS NOT CONTROLLED WITH YOUR NAUSEA MEDICATION  *UNUSUAL SHORTNESS OF BREATH  *UNUSUAL BRUISING OR BLEEDING  TENDERNESS IN MOUTH AND THROAT WITH OR WITHOUT PRESENCE OF ULCERS  *URINARY PROBLEMS  *BOWEL PROBLEMS  UNUSUAL RASH Items with * indicate a potential emergency and should be followed up as soon as possible.  Feel free to call the clinic should you have any questions or concerns. The clinic phone number is [phone].  Please show the [place] at check-in to the Emergency Department and triage nurse.

## 2018-02-16 NOTE — Patient Instructions (Signed)
[facility] Discharge Instructions for Patients Receiving Chemotherapy  Today you received the following chemotherapy agents: Leucovorin and Fluorouracil (Adrucil, 5-FU).   To help prevent nausea and vomiting after your treatment, we encourage you to take your nausea medication as prescribed. Received Aloxi during treatment today-->Take Comapzine (not Zofran) for the next 3 days as needed.   If you develop nausea and vomiting that is not controlled by your nausea medication, call the clinic.   BELOW ARE SYMPTOMS THAT SHOULD BE REPORTED IMMEDIATELY:  *FEVER GREATER THAN 100.5 F  *CHILLS WITH OR WITHOUT FEVER  NAUSEA AND VOMITING THAT IS NOT CONTROLLED WITH YOUR NAUSEA MEDICATION  *UNUSUAL SHORTNESS OF BREATH  *UNUSUAL BRUISING OR BLEEDING  TENDERNESS IN MOUTH AND THROAT WITH OR WITHOUT PRESENCE OF ULCERS  *URINARY PROBLEMS  *BOWEL PROBLEMS  UNUSUAL RASH Items with * indicate a potential emergency and should be followed up as soon as possible.  Feel free to call the clinic should you have any questions or concerns. The clinic phone number is [phone].  Please show the [place] at check-in to the Emergency Department and triage nurse.

## 2018-02-18 ENCOUNTER — Inpatient Hospital Stay (HOSPITAL_BASED_OUTPATIENT_CLINIC_OR_DEPARTMENT_OTHER): Payer: 59

## 2018-02-18 VITALS — BP 125/78 | HR 88 | Temp 98.1°F | Resp 18

## 2018-02-18 DIAGNOSIS — Z7189 Other specified counseling: Secondary | ICD-10-CM | POA: Diagnosis not present

## 2018-02-18 DIAGNOSIS — C787 Secondary malignant neoplasm of liver and intrahepatic bile duct: Secondary | ICD-10-CM

## 2018-02-18 DIAGNOSIS — C189 Malignant neoplasm of colon, unspecified: Secondary | ICD-10-CM | POA: Diagnosis not present

## 2018-02-18 DIAGNOSIS — C182 Malignant neoplasm of ascending colon: Secondary | ICD-10-CM | POA: Diagnosis not present

## 2018-02-18 MED ORDER — SODIUM CHLORIDE 0.9% FLUSH
10.0000 mL | INTRAVENOUS | Status: DC | PRN
  Administered 2018-02-18: 10 mL
  Filled 2018-02-18: qty 10

## 2018-02-18 MED ORDER — HEPARIN SOD (PORK) LOCK FLUSH 100 UNIT/ML IV SOLN
500.0000 [IU] | Freq: Once | INTRAVENOUS | Status: AC | PRN
  Administered 2018-02-18: 500 [IU]
  Filled 2018-02-18: qty 5

## 2018-02-21 DIAGNOSIS — C189 Malignant neoplasm of colon, unspecified: Secondary | ICD-10-CM | POA: Diagnosis not present

## 2018-02-21 DIAGNOSIS — I7091 Generalized atherosclerosis: Secondary | ICD-10-CM | POA: Diagnosis not present

## 2018-02-21 DIAGNOSIS — C188 Malignant neoplasm of overlapping sites of colon: Secondary | ICD-10-CM | POA: Diagnosis not present

## 2018-02-21 DIAGNOSIS — Z9049 Acquired absence of other specified parts of digestive tract: Secondary | ICD-10-CM | POA: Diagnosis not present

## 2018-02-24 ENCOUNTER — Telehealth: Payer: Self-pay

## 2018-02-24 NOTE — Telephone Encounter (Signed)
Voice message received from Dr. Jyl Heinz at [facility] requesting to speak to Dr. Irene Limbo to discuss patient. Dr. Irene Limbo given the message and Dr. Lyda Jester phone number [phone].

## 2018-02-28 ENCOUNTER — Other Ambulatory Visit: Payer: Self-pay | Admitting: Hematology

## 2018-03-02 NOTE — [facility] Notes (Signed)
[facility]    HEMATOLOGY/ONCOLOGY CLINIC NOTE  Date of Service: 03/03/18    Patient Care Team: Scott Contras, MD as PCP - General (Family Medicine) [facility] M.D. 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 KRAS mutated BRAF, NRAS mutation neg MSI Stable.  PET/CT scan done on 06/25/2016 Show multiple foci of hypermetabolic hepatic metastases (atleast 4-5) and 2 hypermetabolic peritoneal nodules along the dorsal peritoneal surface concerning for peritoneal metastases.  MRI Liver 07/27/2016 - Multiple small liver metastases throughout the right and left hepatic lobes, largest measuring 2.1 cm. 1.2 cm enhancing peritoneal nodule in left paracolic gutter, suspicious for peritoneal metastasis. Other small peritoneal nodules better visualized on recent PET-CT which showed hypermetabolic activity, also suspicious for peritoneal metastases.   CT CHEST WO CONTRAST 10/12/2016  [facility] Result Impression   1. Congenital variant of bilateral middle lobes. 2. Groundglass opacification with regions of bronchiectasis in the right lower lobe adjacent to the fissure, consistent with inflammatory/infectious changes. 3. No definitive evidence of metastatic disease to the chest. 4. Probable sebaceous cyst in the subcutaneous tissues beneath the left anterior chest.   MR ABDOMEN AND PELVIS PTWSFKCL FOLLOW UP2/26/2018 [facility] Result Impression    Decreased size of multiple hepatic lesions and two peritoneal nodules compared to outside MRI 07/27/2016. No new lesions identified in  the abdomen or pelvis.     PREVIOUS TREATMENT  FOLFOX x 10 cycles. 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 (without 5FU bolus - due to thrombocytopenia). S/p Y-90 treatment to 1 hepatic lobe.  Plan for prn hepatic Y90 radio-embolization   INTERVAL HISTORY:     [facility] is here for follow-up for his metastatic colon cancer and for next cycle of 5FU/leucovorin for maintenance therapy. The patient's last visit with [place] was on 02/03/18. He is accompanied today by his wife. The pt reports that he is doing well overall. Verbal consent has been given by the pt for a clinical observer, [facility], to be present.   The pt reports that his flank pain has become more constant but has decreased in intensity. He notes that he has had good conversations with Dr. [facility] and Dr. Jyl [facility] at [facility]. He is planning to begin Surgical cyto-reduction on August 29 at [facility] with Dr. Crisoforo [facility].   Of note since the patient's last visit, pt has had visits with Dr. Ned [facility] in IR and Dr. [facility]yl [facility] at [facility]. He had MRI Liver on 02/10/18 at [facility] which revealed Post treatment changes in the posterior right lobe. No significant change in the peripherally enhancing cystic lesion in segment 6 and another subcentimeter punctate T2 hyperintensity is identified in segment 7. The remaining previously reported lesions are not seen on today's exam. Continued attention on follow-up imaging is advised. 2.Interval development of multiple new peritoneal implants in the right anterior abdomen as detailed above and interval increase in size of the implant adjacent to the splenic flexure  The pt also had a  CT C/A/P on 02/21/18 at [facility] which revealed Multiple scattered peritoneal implants in the upper to mid abdomen are similar compared to  02/10/2018. 2.Additional tiny soft tissue implants in the lower abdomen and pelvis are technically new compared to 01/01/2017. Advise continued attention on follow-up.  Lab results today (03/03/18) of CBC w/diff, CMP, and Reticulocytes is as follows: all values are WNL except for Lymphs abs at 800, Glucose at 187. CEA on 03/03/18 is elevated at 36.95  On review of systems, pt reports continued abdominal discomfort, good energy levels and denies any other symptoms.     MEDICAL HISTORY:  Past Medical History:  Diagnosis Date  . Anemia   . Arthritis   . Colon cancer ([place]) dx'd 05/2016  . Diabetes mellitus without complication (HCC)    diet controlled  . History of blood transfusion   . Hyperlipidemia   . liver mets dx'd 05/2016  . Sleep apnea    cpap  . Wears glasses     SURGICAL HISTORY: Past Surgical History:  Procedure Laterality Date  . COLON SURGERY    . IR GENERIC HISTORICAL  09/24/2016   IR CV LINE INJECTION 09/24/2016 [facility]  . IR GENERIC HISTORICAL  09/24/2016   IR US GUIDE VASC ACCESS RIGHT 09/24/2016 [facility]  . IR GENERIC HISTORICAL  09/24/2016   IR FLUORO GUIDE CV LINE RIGHT 09/24/2016 [facility]  . IR GENERIC HISTORICAL  09/30/2016   IR FLUORO GUIDE PORT INSERTION RIGHT 09/30/2016 Scott Daft, MD [facility]  . IR GENERIC HISTORICAL  09/30/2016   IR US GUIDE VASC ACCESS RIGHT 09/30/2016 Scott D[facility], MD [facility]  . IR GENERIC HISTORICAL  09/30/2016   IR REMOVAL TUN ACCESS W/ PORT W/O FL MOD SED 09/30/2016 Scott D[facility], MD [facility]  . LAPAROSCOPIC RIGHT HEMI COLECTOMY Right 06/17/2016   Procedure: LAPAROSCOPIC ASSISTED  RIGHT HEMI COLECTOMY;  Surgeon: Scott Hausen, MD;  Location: [facility];  Service: General;  Laterality: Right;  . PORTACATH PLACEMENT Left 07/13/2016   Procedure: INSERTION PORT-A-CATH left subclavian;  Surgeon: Scott Hausen, MD;  Location: [facility];  Service: General;  Laterality: Left;  . SHOULDER ACROMIOPLASTY Right 12/20/2014   Procedure:  SHOULDER ACROMIOPLASTY;  Surgeon: Scott Nakayama, MD;  Location: [facility];  Service: Orthopedics;  Laterality: Right;  . SHOULDER ARTHROSCOPY Right 12/20/2014   Procedure: RIGHT ARTHROSCOPY SHOULDER WITH DEBRIDEMENT;  Surgeon: Scott Nakayama, MD;  Location: [facility];  Service: Orthopedics;  Laterality: Right;  . TESTICLE SURGERY     as teen  . TONSILLECTOMY    . WISDOM TOOTH EXTRACTION      SOCIAL HISTORY: Social History   Socioeconomic History  . Marital status: Married    Spouse name: Not on file  . Number of children: Not on file  . Years of education: Not on file  . Highest education level: Not on file  Occupational History  . Occupation: cemetery maintenance  Social Needs  . Financial resource strain: Not on file  . Food insecurity:    Worry: Not on file    Inability: Not on file  . Transportation needs:    Medical: Not on file    Non-medical: Not on file  Tobacco Use  . Smoking status: Former Smoker    Packs/day: 1.50    Years: 29.00    Pack years: 43.50    Types: Cigarettes    Last attempt to quit: 12/16/2012    Years since quitting: 5.2  . Smokeless tobacco:  Never Used  . Tobacco comment: 1-2 ppd from age [age]y to 2014  Substance and Sexual Activity  . Alcohol use: Yes    Alcohol/week: 1.8 oz    Types: 3 Shots of liquor per week    Comment:  some days  . Drug use: No  . Sexual activity: Not on file  Lifestyle  . Physical activity:    Days per week: Not on file    Minutes per session: Not on file  . Stress: Not on file  Relationships  . Social connections:    Talks on phone: Not on file    Gets together: Not on file    Attends religious service: Not on file    Active member of club or organization: Not on file    Attends meetings of clubs or organizations: Not on file    Relationship status: Not on file  . Intimate partner violence:    Fear of current or ex partner: Not on file    Emotionally abused: Not on file     Physically abused: Not on file    Forced sexual activity: Not on file  Other Topics Concern  . Not on file  Social History Narrative  . Not on file    FAMILY HISTORY: Family History  Problem Relation Age of Onset  . Diabetes Other   . Hyperlipidemia Other   . Hypertension Other   . Breast cancer Maternal Aunt 70  . Prostate cancer Maternal Uncle 75  . Lung cancer Paternal Uncle 34  . Stroke Maternal Grandfather 72  . Cancer Paternal Grandmother 101       dx cancer of pancreas and colon, unknown if separate primaries  . Heart Problems Paternal Grandfather        d. 88  . Prostate cancer Maternal Uncle 80  . Breast cancer Maternal Aunt 75  . Breast cancer Maternal Aunt 68  . Cervical cancer Maternal Aunt 68  . Pancreatic cancer Maternal Aunt 54       d. 58y; heavy smoker  . Colon cancer Paternal Uncle 40       s/p partial colectomy; dx. second colon cancer at age 64-64    ALLERGIES:  is allergic to penicillins.  MEDICATIONS:  Current Outpatient Medications  Medication Sig Dispense Refill  . dexamethasone (DECADRON) 4 MG tablet TAKE 2 TABLETS BY MOUTH WITH FOOD DAILY (START THE DAY AFTER CHEMOTHERAPY FOR 2 DAYS) 30 tablet 0  . Insulin Glargine (BASAGLAR KWIKPEN) 100 UNIT/ML SOPN Inject 70 Units into the skin every morning.     . lidocaine-prilocaine (EMLA) cream Apply to affected area once 30 g 3  . Multiple Vitamin (MULTIVITAMIN) tablet Take 1 tablet by mouth daily.    . ondansetron (ZOFRAN) 8 MG tablet Take 1 tablet (8 mg total) by mouth 2 (two) times daily as needed for refractory nausea / vomiting. Start on day 3 after chemotherapy. 30 tablet 1  . prochlorperazine (COMPAZINE) 10 MG tablet TAKE 1 TABLET BY MOUTH EVERY 6 HOURS AS NEEDED FOR NAUSEA / VOMITING 30 tablet 1  . traMADol (ULTRAM) 50 MG tablet TAKE 1 TABLET BY MOUTH EVERY 6 HOURS AS NEEDED FOR MODERATE / SEVERE PAIN 28 tablet 2   No current facility-administered medications for this visit.     REVIEW OF  SYSTEMS:   A 10+ POINT REVIEW OF SYSTEMS WAS OBTAINED including neurology, dermatology, psychiatry, cardiac, respiratory, lymph, extremities, GI, GU, Musculoskeletal, constitutional, breasts, reproductive, HEENT.  All pertinent positives are noted in the  HPI.  All others are negative.   PHYSICAL EXAMINATION:  ECOG PERFORMANCE STATUS: 1 - Symptomatic but completely ambulatory  GENERAL:alert, in no acute distress and comfortable SKIN: no acute rashes, no significant lesions EYES: conjunctiva are pink and non-injected, sclera anicteric OROPHARYNX: MMM, no exudates, no oropharyngeal erythema or ulceration NECK: supple, no JVD LYMPH:  no palpable lymphadenopathy in the cervical, axillary or inguinal regions LUNGS: clear to auscultation b/l with normal respiratory effort HEART: regular rate & rhythm ABDOMEN:  normoactive bowel sounds , non tender, not distended. No palpable hepatosplenomegaly.  Extremity: no pedal edema PSYCH: alert & oriented x 3 with fluent speech NEURO: no focal motor/sensory deficits   LABORATORY DATA:  I have reviewed the data as listed  .[facility] CBC Latest Ref Rng & Units 03/03/2018 02/16/2018 02/03/2018  WBC 4.0 - 10.3 K/uL 6.2 7.4 6.4  Hemoglobin 13.0 - 17.1 g/dL 13.9 13.8 14.0  Hematocrit 38.4 - 49.9 % 41.8 41.4 42.2  Platelets 140 - 400 K/uL 146 144 124(L)   . CMP Latest Ref Rng & Units 03/03/2018 02/16/2018 02/03/2018  Glucose 70 - 99 mg/dL 187(H) 192(H) 206(H)  BUN 6 - 20 mg/dL 12 17 10   Creatinine 0.61 - 1.24 mg/dL 0.84 0.84 0.91  Sodium 135 - 145 mmol/L 140 139 141  Potassium 3.5 - 5.1 mmol/L 4.2 4.2 4.1  Chloride 98 - 111 mmol/L 104 103 104  CO2 22 - 32 mmol/L 26 29 27   Calcium 8.9 - 10.3 mg/dL 9.3 9.6 9.9  Total Protein 6.5 - 8.1 g/dL 6.8 6.7 6.9  Total Bilirubin 0.3 - 1.2 mg/dL 0.8 0.7 0.8  Alkaline Phos 38 - 126 U/L 114 121 126  AST 15 - 41 U/L 22 22 26   ALT 0 - 44 U/L 37 35 42   .       Microscopic Comment 2. COLON AND RECTUM (INCLUDING TRANS-ANAL  RESECTION): Specimen: Terminal ileum, right colon and appendix Procedure: Segmental resection Tumor site: Proximal ascending colon Specimen integrity: Intact Macroscopic intactness of mesorectum: Not applicable: x Complete: NA Near complete: NA Incomplete: NA Cannot be determined (specify): NA Macroscopic tumor perforation: The mass invades through muscularis propria into pericolonic soft tissue Invasive tumor: Maximum size: 5.5 cm Histologic type(s): Adenocarcinoma Histologic grade and differentiation: G3 G1: well differentiated/low grade 1 of 4 Supplemental copy SUPPLEMENTAL for [facility], [facility] ([id number]) Microscopic Comment(continued) G2: moderately differentiated/low grade G3: poorly differentiated/high grade G4: undifferentiated/high grade Type of polyp in which invasive carcinoma arose: Tubular adenoma Microscopic extension of invasive tumor: The mass invade through the muscularis propria into pericolonic soft tissue Lymph-Vascular invasion: Identified Peri-neural invasion: Identified Tumor deposit(s) (discontinuous extramural extension): Present Resection margins: Proximal margin: Negative Distal margin: Negative Circumferential (radial) (posterior ascending, posterior descending; lateral and posterior mid-rectum; and entire lower 1/3 rectum):Negative Mesenteric margin (sigmoid and transverse): NA Distance closest margin (if all above margins negative): 3.7 cm from the non peritonealized pericolic soft tissue margin Trans-anal resection margins only: Deep margin: NA Mucosal Margin: NA Distance closest mucosal margin (if negative): NA Treatment effect (neo-adjuvant therapy): NA Additional polyp(s): Negative Non-neoplastic findings: Unremarkable Lymph nodes: number examined 18; number positive: 5 Pathologic Staging: pT3, N2a, M1a Ancillary studies: MSI ordered      RADIOGRAPHIC STUDIES: I have personally reviewed the radiological images as listed and agreed  with the findings in the report. CT abd/pelvis 12/09/2017: IMPRESSION: 1. Heterogeneous hepatic steatosis. Given this mild limitation, no evidence of progressive hepatic metastasis. Similar nonspecific capsular irregularity along the inferior right hepatic lobe. 2. Suspect  developing omental/peritoneal metastasis, as evidenced by new and increased omental nodularity. Trace cul-de-sac fluid is nonspecific but could also be secondary. 3.  No acute process or evidence of metastatic disease in the chest. 4. Age advanced coronary artery atherosclerosis. Recommend assessment of coronary risk factors and consideration of medical therapy. 5.  Aortic Atherosclerosis (ICD10-I70.0). 6. Possible developing cirrhosis.  Correlate with risk factors.   Electronically Signed   By: Abigail Miyamoto M.D.   On: 12/09/2017 14:23    MR LIVER WWO CONTRAST2/14/2019 [facility] Result Impression   1.Decreased sizes of multiple T2 hyperintense metastatic lesions (in the setting of patient's known colon adenocarcinoma with mucinous features) in both hepatic lobes (the majority of which are tiny). 2.No new lesions. 3.Posttreatment changes within the right hepatic lobe. 4.Other findings, as detailed above.  Result Narrative  MR LIVER WITH AND WITHOUT CONTRAST, 09/30/2017 10:20 AM  INDICATION: liver malignancy \ liver malignancy \ C18.9 Metastatic colon cancer to liver (HCC) \ C78.7 Metastatic colon cancer to liver ([address])  ADDITIONAL HISTORY: None. COMPARISON: MRI abdomen dated 07/01/2017.  TECHNIQUE: Multiplanar, multisequence MR images of the upper abdomen were obtained before and after intravenous administration of gadolinium-based contrast.   FINDINGS:  LOWER CHEST Heart: Normal size. No pericardial effusion. Lungs/pleura: No masses, consolidations, or effusions.  ABDOMEN Liver: Changes of prior radioembolization in the right hepatic lobe. Perfusional anomaly in the  posterior right hepatic lobe, likely a venovenous shunt. Decreased sizes of multiple T2 hyperintense metastatic lesions throughout both hepatic lobes (the majority of which are tiny and are marked in PACS on series 5), with the dominant lesion measuring 10 mm in segment 8 (series 5, image 19), versus 12 mm previously. No new lesions. Gallbladder: Normal. No stones or inflammatory changes. Biliary: No obstruction. Spleen: Splenomegaly, with the spleen measuring 15.2 cm in span. Pancreas: Normal. Adrenals: Normal. Kidneys: No masses, stones, or obstruction. Unchanged fluid signal lesions in both kidneys, measuring up to 2.0 cm in the interpolar region of the left kidney. Stomach/bowel: Unremarkable.    ASSESSMENT & PLAN:   47 y.o. 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 KRAS mutated BRAF, NRAS mutation neg MSI Stable.  PET/CT scan done on 06/25/2016 Show multiple foci of hypermetabolic hepatic metastases (atleast 4-5) and 2 hypermetabolic peritoneal nodules along the dorsal peritoneal surface concerning for peritoneal metastases.  MRI Liver 07/27/2016 - Multiple small liver metastases throughout the right and left hepatic lobes, largest measuring 2.1 cm. 1.2 cm enhancing peritoneal nodule in left paracolic gutter, suspicious for peritoneal metastasis. Other small peritoneal nodules better visualized on recent PET-CT which showed hypermetabolic activity, also suspicious for peritoneal metastases.   MRI Abd 10/12/2016 at [facility] --shows improvement in all the liver and the 2 peritoneal lesions and no new lesions.  MRI liver and CT C/A/P on 01/01/2017 as noted above showed improved/stable disease.   MRI Liver 07/01/2017 at [facility] -  1.Three T2 hyperintense metastatic lesions are seen in the liver, with the largest again in segment 8.  These appear unchanged from prior, again without appreciable enhancement or restricted diffusion. Several of the previously noted tiny lesions are no longer visualized. No new lesions identified.  2.Treatment related changes in the posterior right hepatic lobe. 3.A peritoneal nodule adjacent to the splenic flexure is now more conspicuous compared to the prior study but appears similar to more remote  imaging and again could relate to neoplastic disease, although is indeterminate.  CT Abd/pelvis 08/23/2017: Interval resolution of previously seen small right hepatic lobe lesion. Stable tiny sub-cm nodule along the capsular surface of the right hepatic lobe. No new or progressive disease identified. Stable hepatic steatosis with areas of fatty sparing.  MRI 09/30/17:  Decreased sizes of multiple T2 hyperintense metastatic lesions (in the setting of patient's known colon adenocarcinoma with mucinous features) in both hepatic lobes (the majority of which are tiny). No new lesions. Posttreatment changes within the right hepatic lobe. Other findings, as detailed above.   12/09/17 CT C/A/P with pt which revealed Suspected developing omental/peritoneal metastasis, as evidenced by new and increased omental nodularity.  -CEA levels are slowing increasing and on 4/419 were increased to 11.87 and today are up to 27.26  02/10/18 MRI Liver Post treatment changes in the posterior right lobe. No significant change in the peripherally enhancing cystic lesion in segment 6 and another subcentimeter punctate T2 hyperintensity is identified in segment 7. The remaining previously reported lesions are not seen on today's exam. Continued attention on follow-up imaging is advised. 2.Interval development of multiple new peritoneal implants in the right anterior abdomen as detailed above and interval increase in size of the implant adjacent to the splenic flexure  02/21/18 CT C/A/P revealed Multiple scattered peritoneal implants  in the upper to mid abdomen are similar compared to 02/10/2018. 2.Additional tiny soft tissue implants in the lower abdomen and pelvis are technically new compared to 01/01/2017. Advise continued attention on follow-up.   2) Mild thrombocytopenia PLT improved from 124k today(already have held 5FU bolus dose).   PLAN:  -Discussed pt labwork today, 03/03/18; CEA increased to 36.95. Blood counts and chemistries are stable.  -Reviewed the most recent imaging from [facility] including the 02/10/18 MRI Liver and the 02/21/18 CT C/A/P -Surgical cyto-reduction followed by [facility] has been discussed with Dr. Jyl [facility] and will begin on August 29 -Will stop our 5FU/leucovorin on August 1, giving 4 weeks without maintenance treatment prior to surgery on August 29 The pt has no prohibitive toxicities from continuing 5FU/Leucovorin at this time.   -Will see pt back in early October unless any concerns develop before then  4) Iron deficiency anemia due to GI bleeding from the tumor and some blood loss with surgery. -resolved .  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 Counseling regarding advanced care planning and goals of care 05/27/2017  . 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 Genetic testing 10/30/2016  . Port catheter in place 09/25/2016  . Family history of colon cancer 07/24/2016  . Family history of prostate cancer 07/24/2016  . Metastatic colon cancer to liver ([place]) 07/01/2016  . Right Colon cancer metastasized to liver ([place]) 06/17/2016  . Chronic GI bleeding 05/29/2016  . Diabetes mellitus type 2, diet-controlled ([place]) 05/22/2016  . OSA (obstructive sleep apnea) 05/22/2016  . Hyperlipidemia 05/22/2016  . Anemia due to blood loss, chronic   . Antineoplastic chemotherapy induced pancytopenia (CODE) ([place]) 05/21/2016   Plan:  -Continue follow-up  with primary care physician Scott Contras, MD for optimization of diabetes management - now on basal insulin with improving control.  -Continue use of CPAP for sleep apnea.   Will f/u as per scheduled infusion appointment with labs in 2 weeks -cancel infusion appointment after  the one on 03/17/2018 RTC with Dr Irene Limbo with labs in 10 weeks     All of the patients questions were answered with apparent satisfaction. The patient knows to call the clinic with any problems, questions or concerns.  The total time spent in the appt was 25 minutes and more than 50% was on counseling and direct patient cares.     Sullivan Lone MD [place] AAHIVMS [facility] Hematology/Oncology Physician [facility]  (Office):       [phone] (Work cell):  [phone] (Fax):           [phone]  I, Baldwin [place], am acting as a scribe for Dr Irene Limbo.   .I have reviewed the above documentation for accuracy and completeness, and I agree with the above. Brunetta Genera MD

## 2018-03-03 ENCOUNTER — Inpatient Hospital Stay: Payer: 59

## 2018-03-03 ENCOUNTER — Telehealth: Payer: Self-pay | Admitting: Hematology

## 2018-03-03 ENCOUNTER — Inpatient Hospital Stay (HOSPITAL_BASED_OUTPATIENT_CLINIC_OR_DEPARTMENT_OTHER): Payer: 59 | Admitting: Hematology

## 2018-03-03 VITALS — BP 132/87 | HR 74 | Temp 98.0°F | Resp 18

## 2018-03-03 DIAGNOSIS — C7989 Secondary malignant neoplasm of other specified sites: Secondary | ICD-10-CM

## 2018-03-03 DIAGNOSIS — C189 Malignant neoplasm of colon, unspecified: Secondary | ICD-10-CM | POA: Diagnosis not present

## 2018-03-03 DIAGNOSIS — C787 Secondary malignant neoplasm of liver and intrahepatic bile duct: Secondary | ICD-10-CM | POA: Diagnosis not present

## 2018-03-03 DIAGNOSIS — Z8042 Family history of malignant neoplasm of prostate: Secondary | ICD-10-CM

## 2018-03-03 DIAGNOSIS — E1140 Type 2 diabetes mellitus with diabetic neuropathy, unspecified: Secondary | ICD-10-CM

## 2018-03-03 DIAGNOSIS — Z7189 Other specified counseling: Secondary | ICD-10-CM

## 2018-03-03 DIAGNOSIS — C786 Secondary malignant neoplasm of retroperitoneum and peritoneum: Secondary | ICD-10-CM

## 2018-03-03 DIAGNOSIS — D500 Iron deficiency anemia secondary to blood loss (chronic): Secondary | ICD-10-CM | POA: Diagnosis not present

## 2018-03-03 DIAGNOSIS — Z95828 Presence of other vascular implants and grafts: Secondary | ICD-10-CM

## 2018-03-03 DIAGNOSIS — C182 Malignant neoplasm of ascending colon: Secondary | ICD-10-CM

## 2018-03-03 DIAGNOSIS — Z87891 Personal history of nicotine dependence: Secondary | ICD-10-CM

## 2018-03-03 DIAGNOSIS — Z800 Family history of malignant neoplasm of digestive organs: Secondary | ICD-10-CM

## 2018-03-03 DIAGNOSIS — K760 Fatty (change of) liver, not elsewhere classified: Secondary | ICD-10-CM

## 2018-03-03 DIAGNOSIS — M5030 Other cervical disc degeneration, unspecified cervical region: Secondary | ICD-10-CM

## 2018-03-03 DIAGNOSIS — R109 Unspecified abdominal pain: Secondary | ICD-10-CM

## 2018-03-03 DIAGNOSIS — G4733 Obstructive sleep apnea (adult) (pediatric): Secondary | ICD-10-CM

## 2018-03-03 DIAGNOSIS — E785 Hyperlipidemia, unspecified: Secondary | ICD-10-CM

## 2018-03-03 DIAGNOSIS — Z794 Long term (current) use of insulin: Secondary | ICD-10-CM

## 2018-03-03 DIAGNOSIS — Z9989 Dependence on other enabling machines and devices: Secondary | ICD-10-CM

## 2018-03-03 DIAGNOSIS — Z79899 Other long term (current) drug therapy: Secondary | ICD-10-CM

## 2018-03-03 LAB — CMP (CANCER CENTER ONLY)
ALT: 37 U/L (ref 0–44)
AST: 22 U/L (ref 15–41)
Albumin: 4 g/dL (ref 3.5–5.0)
Alkaline Phosphatase: 114 U/L (ref 38–126)
Anion gap: 10 (ref 5–15)
BUN: 12 mg/dL (ref 6–20)
CHLORIDE: 104 mmol/L (ref 98–111)
CO2: 26 mmol/L (ref 22–32)
Calcium: 9.3 mg/dL (ref 8.9–10.3)
Creatinine: 0.84 mg/dL (ref 0.61–1.24)
Glucose, Bld: 187 mg/dL — ABNORMAL HIGH (ref 70–99)
POTASSIUM: 4.2 mmol/L (ref 3.5–5.1)
Sodium: 140 mmol/L (ref 135–145)
Total Bilirubin: 0.8 mg/dL (ref 0.3–1.2)
Total Protein: 6.8 g/dL (ref 6.5–8.1)

## 2018-03-03 LAB — RETICULOCYTES
RBC.: 4.56 MIL/uL (ref 4.20–5.82)
Retic Count, Absolute: 63.8 10*3/uL (ref 34.8–93.9)
Retic Ct Pct: 1.4 % (ref 0.8–1.8)

## 2018-03-03 LAB — CBC WITH DIFFERENTIAL (CANCER CENTER ONLY)
BASOS PCT: 0 %
Basophils Absolute: 0 10*3/uL (ref 0.0–0.1)
EOS ABS: 0.2 10*3/uL (ref 0.0–0.5)
Eosinophils Relative: 4 %
HEMATOCRIT: 41.8 % (ref 38.4–49.9)
Hemoglobin: 13.9 g/dL (ref 13.0–17.1)
LYMPHS ABS: 0.8 10*3/uL — AB (ref 0.9–3.3)
LYMPHS PCT: 13 %
MCH: 30.5 pg (ref 27.2–33.4)
MCHC: 33.3 g/dL (ref 32.0–36.0)
MCV: 91.7 fL (ref 79.3–98.0)
Monocytes Absolute: 0.7 10*3/uL (ref 0.1–0.9)
Monocytes Relative: 12 %
NEUTROS PCT: 71 %
Neutro Abs: 4.4 10*3/uL (ref 1.5–6.5)
Platelet Count: 146 10*3/uL (ref 140–400)
RBC: 4.56 MIL/uL (ref 4.20–5.82)
RDW: 14.3 % (ref 11.0–14.6)
WBC: 6.2 10*3/uL (ref 4.0–10.3)

## 2018-03-03 LAB — CEA (IN HOUSE-CHCC): CEA (CHCC-IN HOUSE): 36.95 ng/mL — AB (ref 0.00–5.00)

## 2018-03-03 MED ORDER — SODIUM CHLORIDE 0.9% FLUSH
10.0000 mL | INTRAVENOUS | Status: DC | PRN
  Administered 2018-03-03: 10 mL via INTRAVENOUS
  Filled 2018-03-03: qty 10

## 2018-03-03 MED ORDER — HEPARIN SOD (PORK) LOCK FLUSH 100 UNIT/ML IV SOLN
500.0000 [IU] | Freq: Once | INTRAVENOUS | Status: DC | PRN
  Filled 2018-03-03: qty 5

## 2018-03-03 MED ORDER — SODIUM CHLORIDE 0.9 % IV SOLN
2400.0000 mg/m2 | INTRAVENOUS | Status: DC
  Administered 2018-03-03: 5800 mg via INTRAVENOUS
  Filled 2018-03-03: qty 116

## 2018-03-03 MED ORDER — LEUCOVORIN CALCIUM INJECTION 350 MG
400.0000 mg/m2 | Freq: Once | INTRAMUSCULAR | Status: AC
  Administered 2018-03-03: 964 mg via INTRAVENOUS
  Filled 2018-03-03: qty 48.2

## 2018-03-03 MED ORDER — DEXAMETHASONE SODIUM PHOSPHATE 10 MG/ML IJ SOLN
INTRAMUSCULAR | Status: AC
  Filled 2018-03-03: qty 1

## 2018-03-03 MED ORDER — DEXAMETHASONE SODIUM PHOSPHATE 10 MG/ML IJ SOLN
10.0000 mg | Freq: Once | INTRAMUSCULAR | Status: AC
  Administered 2018-03-03: 10 mg via INTRAVENOUS

## 2018-03-03 MED ORDER — SODIUM CHLORIDE 0.9% FLUSH
10.0000 mL | INTRAVENOUS | Status: DC | PRN
  Filled 2018-03-03: qty 10

## 2018-03-03 MED ORDER — PALONOSETRON HCL INJECTION 0.25 MG/5ML
0.2500 mg | Freq: Once | INTRAVENOUS | Status: AC
Start: 2018-03-03 — End: 2018-03-03
  Administered 2018-03-03: 0.25 mg via INTRAVENOUS

## 2018-03-03 MED ORDER — PALONOSETRON HCL INJECTION 0.25 MG/5ML
INTRAVENOUS | Status: AC
  Filled 2018-03-03: qty 5

## 2018-03-03 NOTE — Telephone Encounter (Signed)
Scheduled appt per 7/18 los - patient ware of appts - no print out wanted per patient - my chart active.

## 2018-03-03 NOTE — Patient Instructions (Signed)
[facility] Discharge Instructions for Patients Receiving Chemotherapy  Today you received the following chemotherapy agents: Leucovorin and Adrucil   To help prevent nausea and vomiting after your treatment, we encourage you to take your nausea medication as directed.    If you develop nausea and vomiting that is not controlled by your nausea medication, call the clinic.   BELOW ARE SYMPTOMS THAT SHOULD BE REPORTED IMMEDIATELY:  *FEVER GREATER THAN 100.5 F  *CHILLS WITH OR WITHOUT FEVER  NAUSEA AND VOMITING THAT IS NOT CONTROLLED WITH YOUR NAUSEA MEDICATION  *UNUSUAL SHORTNESS OF BREATH  *UNUSUAL BRUISING OR BLEEDING  TENDERNESS IN MOUTH AND THROAT WITH OR WITHOUT PRESENCE OF ULCERS  *URINARY PROBLEMS  *BOWEL PROBLEMS  UNUSUAL RASH Items with * indicate a potential emergency and should be followed up as soon as possible.  Feel free to call the clinic should you have any questions or concerns. The clinic phone number is [phone].  Please show the CHEMO ALERT CARD at check-in to the Emergency Department and triage nurse.   

## 2018-03-05 ENCOUNTER — Inpatient Hospital Stay: Payer: 59

## 2018-03-05 VITALS — BP 135/78 | HR 69 | Temp 97.7°F | Resp 16

## 2018-03-05 DIAGNOSIS — C787 Secondary malignant neoplasm of liver and intrahepatic bile duct: Secondary | ICD-10-CM

## 2018-03-05 DIAGNOSIS — C189 Malignant neoplasm of colon, unspecified: Secondary | ICD-10-CM

## 2018-03-05 DIAGNOSIS — Z7189 Other specified counseling: Secondary | ICD-10-CM

## 2018-03-05 DIAGNOSIS — C182 Malignant neoplasm of ascending colon: Secondary | ICD-10-CM | POA: Diagnosis not present

## 2018-03-05 MED ORDER — HEPARIN SOD (PORK) LOCK FLUSH 100 UNIT/ML IV SOLN
500.0000 [IU] | Freq: Once | INTRAVENOUS | Status: AC | PRN
  Administered 2018-03-05: 500 [IU]
  Filled 2018-03-05: qty 5

## 2018-03-05 MED ORDER — SODIUM CHLORIDE 0.9% FLUSH
10.0000 mL | INTRAVENOUS | Status: DC | PRN
  Administered 2018-03-05: 10 mL
  Filled 2018-03-05: qty 10

## 2018-03-17 ENCOUNTER — Inpatient Hospital Stay: Payer: 59

## 2018-03-17 ENCOUNTER — Other Ambulatory Visit: Payer: Self-pay | Admitting: Hematology

## 2018-03-17 ENCOUNTER — Other Ambulatory Visit: Payer: 59

## 2018-03-17 ENCOUNTER — Inpatient Hospital Stay: Payer: 59 | Attending: Hematology

## 2018-03-17 ENCOUNTER — Ambulatory Visit: Payer: 59 | Admitting: Hematology

## 2018-03-17 VITALS — BP 123/81 | HR 89 | Temp 98.5°F | Resp 17

## 2018-03-17 DIAGNOSIS — C787 Secondary malignant neoplasm of liver and intrahepatic bile duct: Secondary | ICD-10-CM | POA: Insufficient documentation

## 2018-03-17 DIAGNOSIS — C189 Malignant neoplasm of colon, unspecified: Secondary | ICD-10-CM

## 2018-03-17 DIAGNOSIS — Z5111 Encounter for antineoplastic chemotherapy: Secondary | ICD-10-CM | POA: Insufficient documentation

## 2018-03-17 DIAGNOSIS — C7989 Secondary malignant neoplasm of other specified sites: Secondary | ICD-10-CM | POA: Diagnosis not present

## 2018-03-17 DIAGNOSIS — Z452 Encounter for adjustment and management of vascular access device: Secondary | ICD-10-CM | POA: Diagnosis not present

## 2018-03-17 DIAGNOSIS — Z7189 Other specified counseling: Secondary | ICD-10-CM

## 2018-03-17 DIAGNOSIS — C182 Malignant neoplasm of ascending colon: Secondary | ICD-10-CM | POA: Insufficient documentation

## 2018-03-17 DIAGNOSIS — Z95828 Presence of other vascular implants and grafts: Secondary | ICD-10-CM

## 2018-03-17 DIAGNOSIS — D500 Iron deficiency anemia secondary to blood loss (chronic): Secondary | ICD-10-CM

## 2018-03-17 LAB — CBC WITH DIFFERENTIAL/PLATELET
Basophils Absolute: 0 10*3/uL (ref 0.0–0.1)
Basophils Relative: 0 %
EOS PCT: 3 %
Eosinophils Absolute: 0.2 10*3/uL (ref 0.0–0.5)
HEMATOCRIT: 42.7 % (ref 38.4–49.9)
HEMOGLOBIN: 14 g/dL (ref 13.0–17.1)
LYMPHS ABS: 1 10*3/uL (ref 0.9–3.3)
LYMPHS PCT: 14 %
MCH: 30.1 pg (ref 27.2–33.4)
MCHC: 32.8 g/dL (ref 32.0–36.0)
MCV: 91.8 fL (ref 79.3–98.0)
Monocytes Absolute: 0.7 10*3/uL (ref 0.1–0.9)
Monocytes Relative: 9 %
NEUTROS ABS: 5.3 10*3/uL (ref 1.5–6.5)
NEUTROS PCT: 74 %
Platelets: 157 10*3/uL (ref 140–400)
RBC: 4.65 MIL/uL (ref 4.20–5.82)
RDW: 14.5 % (ref 11.0–14.6)
WBC: 7.2 10*3/uL (ref 4.0–10.3)

## 2018-03-17 LAB — CMP (CANCER CENTER ONLY)
ALBUMIN: 4.2 g/dL (ref 3.5–5.0)
ALK PHOS: 115 U/L (ref 38–126)
ALT: 34 U/L (ref 0–44)
ANION GAP: 9 (ref 5–15)
AST: 23 U/L (ref 15–41)
BUN: 14 mg/dL (ref 6–20)
CHLORIDE: 104 mmol/L (ref 98–111)
CO2: 27 mmol/L (ref 22–32)
Calcium: 9.6 mg/dL (ref 8.9–10.3)
Creatinine: 1 mg/dL (ref 0.61–1.24)
GFR, Est AFR Am: >60 mL/min (ref >60)
Glucose, Bld: 162 mg/dL — ABNORMAL HIGH (ref 70–99)
POTASSIUM: 4.5 mmol/L (ref 3.5–5.1)
SODIUM: 140 mmol/L (ref 135–145)
Total Bilirubin: 0.6 mg/dL (ref 0.3–1.2)
Total Protein: 7.3 g/dL (ref 6.5–8.1)

## 2018-03-17 MED ORDER — PALONOSETRON HCL INJECTION 0.25 MG/5ML
0.2500 mg | Freq: Once | INTRAVENOUS | Status: AC
  Administered 2018-03-17: 0.25 mg via INTRAVENOUS

## 2018-03-17 MED ORDER — PALONOSETRON HCL INJECTION 0.25 MG/5ML
INTRAVENOUS | Status: AC
Start: 2018-03-17 — End: ?
  Filled 2018-03-17: qty 5

## 2018-03-17 MED ORDER — SODIUM CHLORIDE 0.9 % IV SOLN
400.0000 mg/m2 | Freq: Once | INTRAVENOUS | Status: AC
  Administered 2018-03-17: 964 mg via INTRAVENOUS
  Filled 2018-03-17: qty 48.2

## 2018-03-17 MED ORDER — SODIUM CHLORIDE 0.9% FLUSH
10.0000 mL | INTRAVENOUS | Status: DC | PRN
  Filled 2018-03-17: qty 10

## 2018-03-17 MED ORDER — HEPARIN SOD (PORK) LOCK FLUSH 100 UNIT/ML IV SOLN
500.0000 [IU] | Freq: Once | INTRAVENOUS | Status: DC | PRN
  Filled 2018-03-17: qty 5

## 2018-03-17 MED ORDER — DEXAMETHASONE SODIUM PHOSPHATE 10 MG/ML IJ SOLN
10.0000 mg | Freq: Once | INTRAMUSCULAR | Status: AC
  Administered 2018-03-17: 10 mg via INTRAVENOUS

## 2018-03-17 MED ORDER — DEXAMETHASONE SODIUM PHOSPHATE 10 MG/ML IJ SOLN
INTRAMUSCULAR | Status: AC
  Filled 2018-03-17: qty 1

## 2018-03-17 MED ORDER — SODIUM CHLORIDE 0.9 % IV SOLN
2400.0000 mg/m2 | INTRAVENOUS | Status: DC
  Administered 2018-03-17: 5800 mg via INTRAVENOUS
  Filled 2018-03-17: qty 116

## 2018-03-17 MED ORDER — DEXTROSE 5 % IV SOLN
Freq: Once | INTRAVENOUS | Status: AC
  Administered 2018-03-17: 11:00:00 via INTRAVENOUS
  Filled 2018-03-17: qty 250

## 2018-03-17 MED ORDER — SODIUM CHLORIDE 0.9% FLUSH
10.0000 mL | INTRAVENOUS | Status: DC | PRN
  Administered 2018-03-17: 10 mL via INTRAVENOUS
  Filled 2018-03-17: qty 10

## 2018-03-17 NOTE — Patient Instructions (Signed)
[facility] Discharge Instructions for Patients Receiving Chemotherapy  Today you received the following chemotherapy agents: Leucovorin and Fluorouracil (Adrucil, 5-FU).   To help prevent nausea and vomiting after your treatment, we encourage you to take your nausea medication as prescribed. Received Aloxi during treatment today-->Take Compazine (not Zofran) for the next 3 days as needed.   If you develop nausea and vomiting that is not controlled by your nausea medication, call the clinic.   BELOW ARE SYMPTOMS THAT SHOULD BE REPORTED IMMEDIATELY:  *FEVER GREATER THAN 100.5 F  *CHILLS WITH OR WITHOUT FEVER  NAUSEA AND VOMITING THAT IS NOT CONTROLLED WITH YOUR NAUSEA MEDICATION  *UNUSUAL SHORTNESS OF BREATH  *UNUSUAL BRUISING OR BLEEDING  TENDERNESS IN MOUTH AND THROAT WITH OR WITHOUT PRESENCE OF ULCERS  *URINARY PROBLEMS  *BOWEL PROBLEMS  UNUSUAL RASH Items with * indicate a potential emergency and should be followed up as soon as possible.  Feel free to call the clinic should you have any questions or concerns. The clinic phone number is [phone].  Please show the [place] at check-in to the Emergency Department and triage nurse.

## 2018-03-19 ENCOUNTER — Inpatient Hospital Stay: Payer: 59

## 2018-03-19 VITALS — BP 124/83 | HR 88 | Temp 98.4°F | Resp 18

## 2018-03-19 DIAGNOSIS — C787 Secondary malignant neoplasm of liver and intrahepatic bile duct: Secondary | ICD-10-CM

## 2018-03-19 DIAGNOSIS — Z7189 Other specified counseling: Secondary | ICD-10-CM

## 2018-03-19 DIAGNOSIS — C189 Malignant neoplasm of colon, unspecified: Secondary | ICD-10-CM

## 2018-03-19 DIAGNOSIS — C182 Malignant neoplasm of ascending colon: Secondary | ICD-10-CM | POA: Diagnosis not present

## 2018-03-19 MED ORDER — HEPARIN SOD (PORK) LOCK FLUSH 100 UNIT/ML IV SOLN
500.0000 [IU] | Freq: Once | INTRAVENOUS | Status: AC | PRN
  Administered 2018-03-19: 500 [IU]
  Filled 2018-03-19: qty 5

## 2018-03-19 MED ORDER — SODIUM CHLORIDE 0.9% FLUSH
10.0000 mL | INTRAVENOUS | Status: DC | PRN
  Administered 2018-03-19: 10 mL
  Filled 2018-03-19: qty 10

## 2018-03-19 NOTE — Patient Instructions (Signed)
Implanted Port Home Guide An implanted port is a type of central line that is placed under the skin. Central lines are used to provide IV access when treatment or nutrition needs to be given through a person's veins. Implanted ports are used for long-term IV access. An implanted port may be placed because:  You need IV medicine that would be irritating to the small veins in your hands or arms.  You need long-term IV medicines, such as antibiotics.  You need IV nutrition for a long period.  You need frequent blood draws for lab tests.  You need dialysis.  Implanted ports are usually placed in the chest area, but they can also be placed in the upper arm, the abdomen, or the leg. An implanted port has two main parts:  Reservoir. The reservoir is round and will appear as a small, raised area under your skin. The reservoir is the part where a needle is inserted to give medicines or draw blood.  Catheter. The catheter is a thin, flexible tube that extends from the reservoir. The catheter is placed into a large vein. Medicine that is inserted into the reservoir goes into the catheter and then into the vein.  How will I care for my incision site? Do not get the incision site wet. Bathe or shower as directed by your health care provider. How is my port accessed? Special steps must be taken to access the port:  Before the port is accessed, a numbing cream can be placed on the skin. This helps numb the skin over the port site.  Your health care provider uses a sterile technique to access the port. ? Your health care provider must put on a mask and sterile gloves. ? The skin over your port is cleaned carefully with an antiseptic and allowed to dry. ? The port is gently pinched between sterile gloves, and a needle is inserted into the port.  Only "non-coring" port needles should be used to access the port. Once the port is accessed, a blood return should be checked. This helps ensure that the port  is in the vein and is not clogged.  If your port needs to remain accessed for a constant infusion, a clear (transparent) bandage will be placed over the needle site. The bandage and needle will need to be changed every week, or as directed by your health care provider.  Keep the bandage covering the needle clean and dry. Do not get it wet. Follow your health care provider's instructions on how to take a shower or bath while the port is accessed.  If your port does not need to stay accessed, no bandage is needed over the port.  What is flushing? Flushing helps keep the port from getting clogged. Follow your health care provider's instructions on how and when to flush the port. Ports are usually flushed with saline solution or a medicine called heparin. The need for flushing will depend on how the port is used.  If the port is used for intermittent medicines or blood draws, the port will need to be flushed: ? After medicines have been given. ? After blood has been drawn. ? As part of routine maintenance.  If a constant infusion is running, the port may not need to be flushed.  How long will my port stay implanted? The port can stay in for as long as your health care provider thinks it is needed. When it is time for the port to come out, surgery will be   done to remove it. The procedure is similar to the one performed when the port was put in. When should I seek immediate medical care? When you have an implanted port, you should seek immediate medical care if:  You notice a bad smell coming from the incision site.  You have swelling, redness, or drainage at the incision site.  You have more swelling or pain at the port site or the surrounding area.  You have a fever that is not controlled with medicine.  This information is not intended to replace advice given to you by your health care provider. Make sure you discuss any questions you have with your health care provider. Document  Released: 08/03/2005 Document Revised: 01/09/2016 Document Reviewed: 04/10/2013 Elsevier Interactive Patient Education  2017 [employer].  

## 2018-03-31 ENCOUNTER — Ambulatory Visit: Payer: 59

## 2018-03-31 ENCOUNTER — Other Ambulatory Visit: Payer: 59

## 2018-03-31 ENCOUNTER — Ambulatory Visit: Payer: 59 | Admitting: Hematology

## 2018-04-01 DIAGNOSIS — C786 Secondary malignant neoplasm of retroperitoneum and peritoneum: Secondary | ICD-10-CM | POA: Diagnosis not present

## 2018-04-01 DIAGNOSIS — Z419 Encounter for procedure for purposes other than remedying health state, unspecified: Secondary | ICD-10-CM | POA: Diagnosis not present

## 2018-04-01 DIAGNOSIS — C801 Malignant (primary) neoplasm, unspecified: Secondary | ICD-10-CM | POA: Diagnosis not present

## 2018-04-01 DIAGNOSIS — Z01810 Encounter for preprocedural cardiovascular examination: Secondary | ICD-10-CM | POA: Diagnosis not present

## 2018-04-01 DIAGNOSIS — C785 Secondary malignant neoplasm of large intestine and rectum: Secondary | ICD-10-CM | POA: Diagnosis not present

## 2018-04-05 DIAGNOSIS — Z01818 Encounter for other preprocedural examination: Secondary | ICD-10-CM | POA: Diagnosis not present

## 2018-04-05 DIAGNOSIS — C801 Malignant (primary) neoplasm, unspecified: Secondary | ICD-10-CM | POA: Diagnosis not present

## 2018-04-05 DIAGNOSIS — C786 Secondary malignant neoplasm of retroperitoneum and peritoneum: Secondary | ICD-10-CM | POA: Diagnosis not present

## 2018-04-14 DIAGNOSIS — C787 Secondary malignant neoplasm of liver and intrahepatic bile duct: Secondary | ICD-10-CM | POA: Diagnosis not present

## 2018-04-14 DIAGNOSIS — C801 Malignant (primary) neoplasm, unspecified: Secondary | ICD-10-CM | POA: Diagnosis not present

## 2018-04-14 DIAGNOSIS — G620 Drug-induced polyneuropathy: Secondary | ICD-10-CM | POA: Diagnosis not present

## 2018-04-14 DIAGNOSIS — R109 Unspecified abdominal pain: Secondary | ICD-10-CM | POA: Diagnosis not present

## 2018-04-14 DIAGNOSIS — Z794 Long term (current) use of insulin: Secondary | ICD-10-CM | POA: Diagnosis not present

## 2018-04-14 DIAGNOSIS — C19 Malignant neoplasm of rectosigmoid junction: Secondary | ICD-10-CM | POA: Diagnosis not present

## 2018-04-14 DIAGNOSIS — E119 Type 2 diabetes mellitus without complications: Secondary | ICD-10-CM | POA: Diagnosis not present

## 2018-04-14 DIAGNOSIS — G8918 Other acute postprocedural pain: Secondary | ICD-10-CM | POA: Diagnosis not present

## 2018-04-14 DIAGNOSIS — C786 Secondary malignant neoplasm of retroperitoneum and peritoneum: Secondary | ICD-10-CM | POA: Diagnosis not present

## 2018-04-14 DIAGNOSIS — Z466 Encounter for fitting and adjustment of urinary device: Secondary | ICD-10-CM | POA: Diagnosis not present

## 2018-04-25 DIAGNOSIS — C786 Secondary malignant neoplasm of retroperitoneum and peritoneum: Secondary | ICD-10-CM | POA: Diagnosis not present

## 2018-04-25 DIAGNOSIS — C785 Secondary malignant neoplasm of large intestine and rectum: Secondary | ICD-10-CM | POA: Diagnosis not present

## 2018-04-25 DIAGNOSIS — C801 Malignant (primary) neoplasm, unspecified: Secondary | ICD-10-CM | POA: Diagnosis not present

## 2018-05-03 DIAGNOSIS — R200 Anesthesia of skin: Secondary | ICD-10-CM | POA: Diagnosis not present

## 2018-05-03 DIAGNOSIS — C786 Secondary malignant neoplasm of retroperitoneum and peritoneum: Secondary | ICD-10-CM | POA: Diagnosis not present

## 2018-05-03 DIAGNOSIS — C189 Malignant neoplasm of colon, unspecified: Secondary | ICD-10-CM | POA: Diagnosis not present

## 2018-05-04 DIAGNOSIS — Z23 Encounter for immunization: Secondary | ICD-10-CM | POA: Diagnosis not present

## 2018-05-07 DIAGNOSIS — Z794 Long term (current) use of insulin: Secondary | ICD-10-CM | POA: Insufficient documentation

## 2018-05-07 DIAGNOSIS — C787 Secondary malignant neoplasm of liver and intrahepatic bile duct: Secondary | ICD-10-CM | POA: Insufficient documentation

## 2018-05-07 DIAGNOSIS — Z79899 Other long term (current) drug therapy: Secondary | ICD-10-CM | POA: Insufficient documentation

## 2018-05-07 DIAGNOSIS — R1084 Generalized abdominal pain: Secondary | ICD-10-CM | POA: Diagnosis not present

## 2018-05-07 DIAGNOSIS — Z87891 Personal history of nicotine dependence: Secondary | ICD-10-CM | POA: Diagnosis not present

## 2018-05-07 DIAGNOSIS — E119 Type 2 diabetes mellitus without complications: Secondary | ICD-10-CM | POA: Diagnosis not present

## 2018-05-07 DIAGNOSIS — Z85038 Personal history of other malignant neoplasm of large intestine: Secondary | ICD-10-CM | POA: Diagnosis not present

## 2018-05-07 DIAGNOSIS — R1110 Vomiting, unspecified: Secondary | ICD-10-CM | POA: Insufficient documentation

## 2018-05-08 ENCOUNTER — Encounter (HOSPITAL_COMMUNITY): Payer: Self-pay

## 2018-05-08 ENCOUNTER — Emergency Department (HOSPITAL_COMMUNITY)
Admission: EM | Admit: 2018-05-08 | Discharge: 2018-05-08 | Disposition: A | Discharge status: Home / Self Care | Payer: 59 | Attending: Emergency Medicine | Admitting: Emergency Medicine

## 2018-05-08 ENCOUNTER — Emergency Department (HOSPITAL_COMMUNITY): Payer: 59

## 2018-05-08 ENCOUNTER — Other Ambulatory Visit: Payer: Self-pay

## 2018-05-08 DIAGNOSIS — R1110 Vomiting, unspecified: Secondary | ICD-10-CM

## 2018-05-08 DIAGNOSIS — R1084 Generalized abdominal pain: Secondary | ICD-10-CM

## 2018-05-08 LAB — CBC
HEMATOCRIT: 40.9 % (ref 39.0–52.0)
HEMOGLOBIN: 13.1 g/dL (ref 13.0–17.0)
MCH: 28.6 pg (ref 26.0–34.0)
MCHC: 32 g/dL (ref 30.0–36.0)
MCV: 89.3 fL (ref 78.0–100.0)
Platelets: 246 10*3/uL (ref 150–400)
RBC: 4.58 MIL/uL (ref 4.22–5.81)
RDW: 13.5 % (ref 11.5–15.5)
WBC: 7.8 10*3/uL (ref 4.0–10.5)

## 2018-05-08 LAB — COMPREHENSIVE METABOLIC PANEL
ALT: 310 U/L — ABNORMAL HIGH (ref 0–44)
AST: 239 U/L — ABNORMAL HIGH (ref 15–41)
Albumin: 4.2 g/dL (ref 3.5–5.0)
Alkaline Phosphatase: 216 U/L — ABNORMAL HIGH (ref 38–126)
Anion gap: 11 (ref 5–15)
BUN: 10 mg/dL (ref 6–20)
CHLORIDE: 104 mmol/L (ref 98–111)
CO2: 26 mmol/L (ref 22–32)
Calcium: 9.6 mg/dL (ref 8.9–10.3)
Creatinine, Ser: 0.79 mg/dL (ref 0.61–1.24)
Glucose, Bld: 137 mg/dL — ABNORMAL HIGH (ref 70–99)
POTASSIUM: 3.8 mmol/L (ref 3.5–5.1)
Sodium: 141 mmol/L (ref 135–145)
Total Bilirubin: 2.1 mg/dL — ABNORMAL HIGH (ref 0.3–1.2)
Total Protein: 7.3 g/dL (ref 6.5–8.1)

## 2018-05-08 LAB — URINALYSIS, ROUTINE W REFLEX MICROSCOPIC
Bilirubin Urine: NEGATIVE
Glucose, UA: NEGATIVE mg/dL
Hgb urine dipstick: NEGATIVE
Ketones, ur: NEGATIVE mg/dL
Leukocytes, UA: NEGATIVE
Nitrite: NEGATIVE
PH: 7 (ref 5.0–8.0)
Protein, ur: NEGATIVE mg/dL
SPECIFIC GRAVITY, URINE: 1.015 (ref 1.005–1.030)

## 2018-05-08 LAB — LIPASE, BLOOD: LIPASE: 63 U/L — AB (ref 11–51)

## 2018-05-08 MED ORDER — SODIUM CHLORIDE 0.9 % IV BOLUS
1000.0000 mL | Freq: Once | INTRAVENOUS | Status: AC
  Administered 2018-05-08: 1000 mL via INTRAVENOUS

## 2018-05-08 MED ORDER — MORPHINE SULFATE (PF) 4 MG/ML IV SOLN
4.0000 mg | Freq: Once | INTRAVENOUS | Status: AC
  Administered 2018-05-08: 4 mg via INTRAVENOUS
  Filled 2018-05-08: qty 1

## 2018-05-08 MED ORDER — IOPAMIDOL (ISOVUE-300) INJECTION 61%
100.0000 mL | Freq: Once | INTRAVENOUS | Status: AC | PRN
  Administered 2018-05-08: 100 mL via INTRAVENOUS

## 2018-05-08 MED ORDER — ONDANSETRON 4 MG PO TBDP: 4.0000 mg | ORAL_TABLET | Freq: Three times a day (TID) | ORAL | 0 refills | Status: DC | PRN

## 2018-05-08 MED ORDER — ONDANSETRON HCL 4 MG/2ML IJ SOLN
4.0000 mg | Freq: Once | INTRAMUSCULAR | Status: AC
  Administered 2018-05-08: 4 mg via INTRAVENOUS
  Filled 2018-05-08: qty 2

## 2018-05-08 MED ORDER — HEPARIN SOD (PORK) LOCK FLUSH 100 UNIT/ML IV SOLN
500.0000 [IU] | Freq: Once | INTRAVENOUS | Status: AC
  Administered 2018-05-08: 500 [IU]
  Filled 2018-05-08: qty 5

## 2018-05-08 NOTE — ED Provider Notes (Signed)
[facility] DEPT Provider Note   CSN: [id number] Arrival date & time: 05/07/18  2355     History   Chief Complaint Chief Complaint  Patient presents with  . Abdominal Pain    Post Op; Stage 4 Colon Cancer    HPI Scott Gallagher is a 47 y.o. male.  Patient presents to the emergency department with a chief complaint of abdominal pain and vomiting.  He has metastatic colon cancer with mets to his liver.  [facility] to see about resecting some tumor burden, but ultimately was unable because of the extensive tumor burden.  Patient reports having intermittent low-grade fevers since the operation, which was about a month ago now.  He states that today he had several episodes of vomiting and pain, but never had any nausea.  He denies any other associated symptoms.  The history is provided by the patient. No language interpreter was used.    Past Medical History:  Diagnosis Date  . Anemia   . Arthritis   . Colon cancer ([place]) dx'd 05/2016  . Diabetes mellitus without complication (HCC)    diet controlled  . History of blood transfusion   . Hyperlipidemia   . liver mets dx'd 05/2016  . Sleep apnea    cpap  . Wears glasses     Patient Active Problem List   Diagnosis Date Noted  . Counseling regarding advanced care planning and goals of care 05/27/2017  . 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 Genetic testing 10/30/2016  . Port catheter in place 09/25/2016  . Family history of colon cancer 07/24/2016  . Family history of prostate cancer 07/24/2016  . Metastatic colon cancer to liver ([place]) 07/01/2016  . Right Colon cancer metastasized to liver ([place]) 06/17/2016  . Chronic GI bleeding 05/29/2016  . Diabetes mellitus type 2, diet-controlled ([place]) 05/22/2016  . OSA (obstructive sleep apnea) 05/22/2016  . Hyperlipidemia 05/22/2016  . Anemia due to blood loss, chronic   . Antineoplastic chemotherapy induced  pancytopenia (CODE) ([place]) 05/21/2016    Past Surgical History:  Procedure Laterality Date  . COLON SURGERY    . IR GENERIC HISTORICAL  09/24/2016   IR CV LINE INJECTION 09/24/2016 [facility]  . IR GENERIC HISTORICAL  09/24/2016   IR US GUIDE VASC ACCESS RIGHT 09/24/2016 [facility]  . IR GENERIC HISTORICAL  09/24/2016   IR FLUORO GUIDE CV LINE RIGHT 09/24/2016 [facility]  . IR GENERIC HISTORICAL  09/30/2016   IR FLUORO GUIDE PORT INSERTION RIGHT 09/30/2016 Markus Daft, MD [facility]  . IR GENERIC HISTORICAL  09/30/2016   IR US GUIDE VASC ACCESS RIGHT 09/30/2016 Markus Daft, MD [facility]  . IR GENERIC HISTORICAL  09/30/2016   IR REMOVAL TUN ACCESS W/ PORT W/O FL MOD SED 09/30/2016 Markus Daft, MD [facility]  . LAPAROSCOPIC RIGHT HEMI COLECTOMY Right 06/17/2016   Procedure: LAPAROSCOPIC ASSISTED  RIGHT HEMI COLECTOMY;  Surgeon: [name], MD;  Location: [facility];  Service: General;  Laterality: Right;  . PORTACATH PLACEMENT Left 07/13/2016   Procedure: INSERTION PORT-A-CATH left subclavian;  Surgeon: [name], MD;  Location: [facility];  Service: General;  Laterality: Left;  . SHOULDER ACROMIOPLASTY Right 12/20/2014   Procedure: SHOULDER ACROMIOPLASTY;  Surgeon: Melrose Nakayama, MD;  Location: [facility];  Service: Orthopedics;  Laterality: Right;  . SHOULDER ARTHROSCOPY Right 12/20/2014   Procedure: RIGHT ARTHROSCOPY SHOULDER WITH DEBRIDEMENT;  Surgeon: Melrose Nakayama, MD;  Location: [facility];  Service: Orthopedics;  Laterality: Right;  . TESTICLE SURGERY     as teen  . TONSILLECTOMY    . WISDOM TOOTH EXTRACTION          Home Medications    Prior to Admission medications   Medication Sig Start Date End Date Taking? Authorizing Provider  dexamethasone (DECADRON) 4 MG tablet TAKE 2 TABLETS BY MOUTH WITH FOOD DAILY (START THE DAY AFTER CHEMOTHERAPY FOR 2 DAYS) 02/18/18   Brunetta Genera, MD  Insulin Glargine (BASAGLAR KWIKPEN) 100 UNIT/ML SOPN Inject 70  Units into the skin every morning.     [provider]  lidocaine-prilocaine (EMLA) cream Apply to affected area once 07/01/16   Brunetta Genera, MD  Multiple Vitamin (MULTIVITAMIN) tablet Take 1 tablet by mouth daily.    [provider]  ondansetron (ZOFRAN) 8 MG tablet Take 1 tablet (8 mg total) by mouth 2 (two) times daily as needed for refractory nausea / vomiting. Start on day 3 after chemotherapy. 07/01/16   Brunetta Genera, MD  prochlorperazine (COMPAZINE) 10 MG tablet TAKE 1 TABLET BY MOUTH EVERY 6 HOURS AS NEEDED FOR NAUSEA / VOMITING 11/06/17   Brunetta Genera, MD  traMADol (ULTRAM) 50 MG tablet TAKE 1 TABLET BY MOUTH EVERY 6 HOURS AS NEEDED FOR MODERATE / SEVERE PAIN 02/28/18   Brunetta Genera, MD    Family History Family History  Problem Relation Age of Onset  . Diabetes Other   . Hyperlipidemia Other   . Hypertension Other   . Breast cancer Maternal Aunt 70  . Prostate cancer Maternal Uncle 75  . Lung cancer Paternal Uncle 30  . Stroke Maternal Grandfather 72  . Cancer Paternal Grandmother 46       dx cancer of pancreas and colon, unknown if separate primaries  . Heart Problems Paternal Grandfather        d. 62  . Prostate cancer Maternal Uncle 52  . Breast cancer Maternal Aunt 75  . Breast cancer Maternal Aunt 68  . Cervical cancer Maternal Aunt 68  . Pancreatic cancer Maternal Aunt 54       d. 58y; heavy smoker  . Colon cancer Paternal Uncle 40       s/p partial colectomy; dx. second colon cancer at age 24-64    Social History Social History   Tobacco Use  . Smoking status: Former Smoker    Packs/day: 1.50    Years: 29.00    Pack years: 43.50    Types: Cigarettes    Last attempt to quit: 12/16/2012    Years since quitting: 5.3  . Smokeless tobacco: Never Used  . Tobacco comment: 1-2 ppd from age [age]y to 2014  Substance Use Topics  . Alcohol use: Yes    Alcohol/week: 3.0 standard drinks    Types: 3 Shots of liquor per  week    Comment:  some days  . Drug use: No     Allergies   Penicillins   Review of Systems Review of Systems  All other systems reviewed and are negative.    Physical Exam Updated Vital Signs BP (!) 149/104 (BP Location: Left Arm)   Pulse 90   Temp 98.4 F (36.9 C) (Oral)   Resp 20   SpO2 100%   Physical Exam  Constitutional: He is oriented to person, place, and time. He appears well-developed and well-nourished.  HENT:  Head: Normocephalic and atraumatic.  Eyes: Pupils are equal, round, and reactive to light. Conjunctivae and EOM  are normal. Right eye exhibits no discharge. Left eye exhibits no discharge. No scleral icterus.  Neck: Normal range of motion. Neck supple. No JVD present.  Cardiovascular: Normal rate, regular rhythm and normal heart sounds. Exam reveals no gallop and no friction rub.  No murmur heard. Pulmonary/Chest: Effort normal and breath sounds normal. No respiratory distress. He has no wheezes. He has no rales. He exhibits no tenderness.  Abdominal: Soft. He exhibits no distension and no mass. There is generalized tenderness. There is no rebound and no guarding.    Midline healing surgical incision  Musculoskeletal: Normal range of motion. He exhibits no edema or tenderness.  Neurological: He is alert and oriented to person, place, and time.  Skin: Skin is warm and dry.  Psychiatric: He has a normal mood and affect. His behavior is normal. Judgment and thought content normal.  Nursing note and vitals reviewed.    ED Treatments / Results  Labs (all labs ordered are listed, but only abnormal results are displayed) Labs Reviewed  LIPASE, BLOOD - Abnormal; Notable for the following components:      Result Value   Lipase 63 (*)    All other components within normal limits  COMPREHENSIVE METABOLIC PANEL - Abnormal; Notable for the following components:   Glucose, Bld 137 (*)    AST 239 (*)    ALT 310 (*)    Alkaline Phosphatase 216 (*)    Total  Bilirubin 2.1 (*)    All other components within normal limits  URINALYSIS, ROUTINE W REFLEX MICROSCOPIC - Abnormal; Notable for the following components:   Color, Urine AMBER (*)    All other components within normal limits  CBC    EKG None  Radiology Ct Abdomen Pelvis W Contrast  Result Date: 05/08/2018 CLINICAL DATA:  Stage IV colon cancer with sudden intense abdominal pain starting at 10 p.m. this evening. Endorses episode of vomiting. Back pain. Postop from laparoscopic surgery on 04/14/2018. EXAM: CT ABDOMEN AND PELVIS WITH CONTRAST TECHNIQUE: Multidetector CT imaging of the abdomen and pelvis was performed using the standard protocol following bolus administration of intravenous contrast. CONTRAST:  130mL ISOVUE-300 IOPAMIDOL (ISOVUE-300) INJECTION 61% COMPARISON:  Prior studies dating back through 08/23/2017. FINDINGS: Lower chest: Tip of right port catheter terminates at the cavoatrial junction. Heart size is normal without pericardial effusion. Coronary arteriosclerosis is redemonstrated along the LAD. No dominant mass is identified the lung bases. Minimal sub segmental and/or dependent atelectasis is seen bilaterally. No pleural effusion or pneumothorax. Hepatobiliary: Steatosis of the liver with indeterminate areas of hypodensity noted posteriorly within the right hepatic lobe, series 2/20 measuring up to 2.4 cm as well as in the posterior segment right hepatic lobe further caudad measuring up to 15 mm, series 2/33. Ill-defined small cluster of hypodensity also noted in the right hepatic lobe, series 2/30 with the smaller rounded components measuring up to 9 mm each. Subtle area of increased density adjacent to the gallbladder on series 2/30 may represent an area focal fatty sparing. The gallbladder is free of stones. There is no biliary dilatation. Pancreas: Normal gallbladder. Spleen: Normal spleen. Adrenals/Urinary Tract: Adrenal glands are unremarkable. Kidneys are normal, without renal  calculi, focal lesion, or hydronephrosis. There is however a stable parapelvic left upper pole renal cyst measuring up to 2 cm. Bladder is unremarkable. Stomach/Bowel: Small hiatal hernia. The stomach is contracted in appearance. The duodenal sweep and ligament of Treitz are normal. No bowel obstruction acute inflammation is seen. Partial right hemicolectomy with patent anastomosis.  No apparent recurrence of disease. Vascular/Lymphatic: Minimal aortoiliac atherosclerosis. Small mesenteric nodules are identified in the right lower quadrant measuring up to 15 mm, series 2/48. Surgical coils in the right hepatic region likely representing areas of embolization. Hazy mesenteric fatty induration likely representing recent postsurgical change is identified with ventral midline incisional scarring noted. Reproductive: Normal size prostate. Other: Ventral midline scarring above and below the level of the umbilicus. Musculoskeletal: No aggressive osseous lesions. IMPRESSION: 1. Coronary arteriosclerosis. 2. Steatosis of the liver with indeterminate areas of hypodensity within the right hepatic lobe possibly representing post treatment change. New subtle lesions are not entirely excluded. Short-term interval follow-up and attention to these areas is recommended. MRI may also prove useful for better assessment with IV contrast. 3. Left upper pole parapelvic cyst, stable in appearance measuring approximately 2 cm in diameter. No worrisome features. 4. Small mesenteric nodules consistent with mildly enlarged mesenteric lymph nodes or peritoneal metastasis are noted in the right hemiabdomen and ventral upper abdomen. These are not definitively identified on prior exam and suspect these are new or have enlarged in the interim. 5. Mechanical bowel obstruction or inflammation. 6. New ventral midline surgical scarring post surgical change suspected in the mesentery. Electronically Signed   By: Ashley Royalty M.D.   On: 05/08/2018 03:42      Procedures Procedures (including critical care time)  Medications Ordered in ED Medications  sodium chloride 0.9 % bolus 1,000 mL (has no administration in time range)  morphine 4 MG/ML injection 4 mg (has no administration in time range)  ondansetron (ZOFRAN) injection 4 mg (has no administration in time range)     Initial Impression / Assessment and Plan / ED Course  I have reviewed the triage vital signs and the nursing notes.  Pertinent labs & imaging results that were available during my care of the patient were reviewed by me and considered in my medical decision making (see chart for details).     Patient with metastatic colon cancer with metastases to the liver presents with abdominal pain and vomiting.  He reports having recent abdominal surgery.  He is concerned about obstruction.  Denies any fevers.  Denies any blood in his vomit.  Denies any diarrhea.  He reports chronic right upper quadrant abdominal pain.  Denies any new changes in regards to this pain.  Check CT and labs given history.  Laboratory work-up shows moderately elevated LFTs, there is no evidence of gallstones, there is no biliary dilatation.  Doubt cholecystitis.  CT shows no evidence of mechanical obstruction.  There are lesions and probable new lesions in the liver.  MRI may be beneficial.  We will have the patient discuss this with his oncologist.    Patient tolerates fluid challenge.  He denies any pain in his back or abdomen with the fluid challenge.  Will have patient follow-up with his oncologist.  Patient discussed with Dr. Randal Buba, who also reviewed labs and imaging with me.  Agrees with plan for discharge and close follow-up.  Final Clinical Impressions(s) / ED Diagnoses   Final diagnoses:  Vomiting, intractability of vomiting not specified, presence of nausea not specified, unspecified vomiting type  Generalized abdominal pain    ED Discharge Orders         Ordered    ondansetron  (ZOFRAN ODT) 4 MG disintegrating tablet  Every 8 hours PRN     05/08/18 0455           Montine Circle, PA-C 05/08/18 0502  Palumbo, April, MD 05/08/18 [id number]

## 2018-05-08 NOTE — Discharge Instructions (Signed)
Attached are your labs and imaging report.  Please follow-up with your oncologist.  Call them Monday and inform them of the results.  Namely elevated liver function tests and recommendation for MRI of abdomen.

## 2018-05-08 NOTE — ED Triage Notes (Addendum)
Stage 4 Colon Cancer. Patient reports sudden, intense abdominal pain starting around 10p this evening. He endorses an episode of vomiting at 11p. Pain has persisted and seems to come in waves. Also reports back pain. Post op from laproscopic surgery on 8/29. Denies fever at home.

## 2018-05-10 DIAGNOSIS — K859 Acute pancreatitis without necrosis or infection, unspecified: Secondary | ICD-10-CM | POA: Diagnosis not present

## 2018-05-10 DIAGNOSIS — K851 Biliary acute pancreatitis without necrosis or infection: Secondary | ICD-10-CM | POA: Diagnosis not present

## 2018-05-10 DIAGNOSIS — T184XXA Foreign body in colon, initial encounter: Secondary | ICD-10-CM | POA: Diagnosis not present

## 2018-05-10 DIAGNOSIS — K838 Other specified diseases of biliary tract: Secondary | ICD-10-CM | POA: Diagnosis not present

## 2018-05-10 DIAGNOSIS — C182 Malignant neoplasm of ascending colon: Secondary | ICD-10-CM | POA: Diagnosis not present

## 2018-05-10 DIAGNOSIS — R1011 Right upper quadrant pain: Secondary | ICD-10-CM | POA: Diagnosis not present

## 2018-05-10 DIAGNOSIS — C786 Secondary malignant neoplasm of retroperitoneum and peritoneum: Secondary | ICD-10-CM | POA: Diagnosis not present

## 2018-05-10 DIAGNOSIS — C787 Secondary malignant neoplasm of liver and intrahepatic bile duct: Secondary | ICD-10-CM | POA: Diagnosis not present

## 2018-05-10 DIAGNOSIS — C189 Malignant neoplasm of colon, unspecified: Secondary | ICD-10-CM | POA: Diagnosis not present

## 2018-05-10 DIAGNOSIS — K831 Obstruction of bile duct: Secondary | ICD-10-CM | POA: Diagnosis not present

## 2018-05-10 DIAGNOSIS — R0602 Shortness of breath: Secondary | ICD-10-CM | POA: Diagnosis not present

## 2018-05-10 DIAGNOSIS — K9189 Other postprocedural complications and disorders of digestive system: Secondary | ICD-10-CM | POA: Diagnosis not present

## 2018-05-10 DIAGNOSIS — R079 Chest pain, unspecified: Secondary | ICD-10-CM | POA: Diagnosis not present

## 2018-05-10 DIAGNOSIS — R932 Abnormal findings on diagnostic imaging of liver and biliary tract: Secondary | ICD-10-CM | POA: Diagnosis not present

## 2018-05-18 NOTE — Progress Notes (Signed)
[name]    HEMATOLOGY/ONCOLOGY CLINIC NOTE  Date of Service: 05/19/18    Patient Care Team: [name], MD as PCP - General (Family Medicine) [name] M.D. 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 KRAS mutated BRAF, NRAS mutation neg MSI Stable.  PET/CT scan done on 06/25/2016 Show multiple foci of hypermetabolic hepatic metastases (atleast 4-5) and 2 hypermetabolic peritoneal nodules along the dorsal peritoneal surface concerning for peritoneal metastases.  MRI Liver 07/27/2016 - Multiple small liver metastases throughout the right and left hepatic lobes, largest measuring 2.1 cm. 1.2 cm enhancing peritoneal nodule in left paracolic gutter, suspicious for peritoneal metastasis. Other small peritoneal nodules better visualized on recent PET-CT which showed hypermetabolic activity, also suspicious for peritoneal metastases.   CT CHEST WO CONTRAST 10/12/2016  [facility] Result Impression   1. Congenital variant of bilateral middle lobes. 2. Groundglass opacification with regions of bronchiectasis in the right lower lobe adjacent to the fissure, consistent with inflammatory/infectious changes. 3. No definitive evidence of metastatic disease to the chest. 4. Probable sebaceous cyst in the subcutaneous tissues beneath the left anterior chest.   MR ABDOMEN AND PELVIS IOMBTDHR FOLLOW UP2/26/2018 [facility] Result Impression    Decreased size of multiple hepatic lesions and two peritoneal nodules compared to outside MRI 07/27/2016. No new lesions identified in  the abdomen or pelvis.     PREVIOUS TREATMENT  FOLFOX x 10 cycles. 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 (without 5FU bolus - due to thrombocytopenia). S/p Y-90 treatment to 1 hepatic lobe.  Plan for prn hepatic Y90 radio-embolization   INTERVAL HISTORY:     Kassius Battiste is here for follow-up for his metastatic colon cancer and for next cycle of 5FU/leucovorin for maintenance therapy. The patient's last visit with [place] was on 03/03/18. He is accompanied today by his wife. The pt reports that he is doing well overall.   In the interim the pt was planning to have a Surgical Cyto-reduction with Dr. Crisoforo Oxford at [facility] on April 14, 2018. However, due to burden of diseasenoted intra-operatively was not detemined to be a candidate fo cyto reduction or Hipec.  He had a stent placed in his bile duct and later developed pancreatitis in September. He reports having lost 20 pounds in the interim.   The pt reports that he is moving his bowels and is eating better. He notes that he has been averaging a 99.7 temperature and has not had antibiotics since last discharge at 05/10/18. The pt notes that he is using Oxycodone a little less than 4 hours  for his abdominal pains from his recent pancreatitis. He is not using laxatives but is moving his bowels.   The pt notes that he has had more numbness and tingling in his fourth and fifth digits of his right hand since his 04/14/18 surgery. He denies any new neck pains. He denies any other trauma and any other nerve symptoms. He has no changes in motor function and also denies pain.  He will be seeing neurology in late November.   He will be following up with Dr. Crisoforo Oxford 06/10/18, and Dr Pecola Lawless in December.   Of note since the patient's last visit, pt has had CT A/P completed on 05/08/18 with results revealing Coronary  arteriosclerosis. 2. Steatosis of the liver with indeterminate areas of hypodensity within the right hepatic lobe possibly representing post treatment change. New subtle lesions are not entirely excluded. Short-term interval follow-up and attention to these areas is recommended. MRI may also prove useful for better assessment with IV contrast. 3. Left upper pole parapelvic cyst, stable in appearance measuring approximately 2 cm in diameter. No worrisome features. 4. Small mesenteric nodules consistent with mildly enlarged mesenteric lymph nodes or peritoneal metastasis are noted in the right hemiabdomen and ventral upper abdomen. These are not definitively identified on prior exam and suspect these are new or have enlarged in the interim. 5. Mechanical bowel obstruction or inflammation. 6. New ventral midline surgical scarring post surgical change suspected in the mesentery.  Lab results today (05/19/18) of CBC w/diff, CMP, and Reticulocytes is as follows: all values are WNL except for Monocytes abs at 1.0k, Glucose at 184, ALT at 81, Alk Phos at 228. 05/19/18 CEA at 60.54  On review of systems, pt reports improved eating, moving his bowels, controlled abdominal pain, weight loss, and denies fevers, neck pain, SOB, difficulty breathing, problems with port, testicular pain swelling, constipation, and any other symptoms.   MEDICAL HISTORY:  Past Medical History:  Diagnosis Date  . Anemia   . Arthritis   . Colon cancer ([place]) dx'd 05/2016  . Diabetes mellitus without complication (HCC)    diet controlled  . History of blood transfusion   . Hyperlipidemia   . liver mets dx'd 05/2016  . Sleep apnea    cpap  . Wears glasses     SURGICAL HISTORY: Past Surgical History:  Procedure Laterality Date  . COLON SURGERY    . IR GENERIC HISTORICAL  09/24/2016   IR CV LINE INJECTION 09/24/2016 [facility]  . IR GENERIC HISTORICAL  09/24/2016   IR US GUIDE VASC ACCESS RIGHT 09/24/2016 [facility]  . IR GENERIC  HISTORICAL  09/24/2016   IR FLUORO GUIDE CV LINE RIGHT 09/24/2016 [facility]  . IR GENERIC HISTORICAL  09/30/2016   IR FLUORO GUIDE PORT INSERTION RIGHT 09/30/2016 Markus Daft, MD [facility]  . IR GENERIC HISTORICAL  09/30/2016   IR US GUIDE VASC ACCESS RIGHT 09/30/2016 Markus Daft, MD [facility]  . IR GENERIC HISTORICAL  09/30/2016   IR REMOVAL TUN ACCESS W/ PORT W/O FL MOD SED 09/30/2016 Markus Daft, MD [facility]  . LAPAROSCOPIC RIGHT HEMI COLECTOMY Right 06/17/2016   Procedure: LAPAROSCOPIC ASSISTED  RIGHT HEMI COLECTOMY;  Surgeon: [name], MD;  Location: [facility];  Service: General;  Laterality: Right;  . PORTACATH PLACEMENT Left 07/13/2016   Procedure: INSERTION PORT-A-CATH left subclavian;  Surgeon: [name], MD;  Location: [facility];  Service: General;  Laterality: Left;  . SHOULDER ACROMIOPLASTY Right 12/20/2014   Procedure: SHOULDER ACROMIOPLASTY;  Surgeon: Melrose Nakayama, MD;  Location: [facility];  Service: Orthopedics;  Laterality: Right;  . SHOULDER ARTHROSCOPY Right 12/20/2014   Procedure: RIGHT ARTHROSCOPY SHOULDER WITH DEBRIDEMENT;  Surgeon: Melrose Nakayama, MD;  Location: [facility];  Service: Orthopedics;  Laterality: Right;  . TESTICLE SURGERY     as teen  . TONSILLECTOMY    . WISDOM TOOTH EXTRACTION      SOCIAL  HISTORY: Social History   Socioeconomic History  . Marital status: Married    Spouse name: Not on file  . Number of children: Not on file  . Years of education: Not on file  . Highest education level: Not on file  Occupational History  . Occupation: cemetery maintenance  Social Needs  . Financial resource strain: Not on file  . Food insecurity:    Worry: Not on file    Inability: Not on file  . Transportation needs:    Medical: Not on file    Non-medical: Not on file  Tobacco Use  . Smoking status: Former Smoker    Packs/day: 1.50    Years: 29.00    Pack years: 43.50    Types: Cigarettes    Last attempt to quit:  12/16/2012    Years since quitting: 5.4  . Smokeless tobacco: Never Used  . Tobacco comment: 1-2 ppd from age [age]y to 2014  Substance and Sexual Activity  . Alcohol use: Yes    Alcohol/week: 3.0 standard drinks    Types: 3 Shots of liquor per week    Comment:  some days  . Drug use: No  . Sexual activity: Not on file  Lifestyle  . Physical activity:    Days per week: Not on file    Minutes per session: Not on file  . Stress: Not on file  Relationships  . Social connections:    Talks on phone: Not on file    Gets together: Not on file    Attends religious service: Not on file    Active member of club or organization: Not on file    Attends meetings of clubs or organizations: Not on file    Relationship status: Not on file  . Intimate partner violence:    Fear of current or ex partner: Not on file    Emotionally abused: Not on file    Physically abused: Not on file    Forced sexual activity: Not on file  Other Topics Concern  . Not on file  Social History Narrative  . Not on file    FAMILY HISTORY: Family History  Problem Relation Age of Onset  . Diabetes Other   . Hyperlipidemia Other   . Hypertension Other   . Breast cancer Maternal Aunt 70  . Prostate cancer Maternal Uncle 75  . Lung cancer Paternal Uncle 63  . Stroke Maternal Grandfather 72  . Cancer Paternal Grandmother 79       dx cancer of pancreas and colon, unknown if separate primaries  . Heart Problems Paternal Grandfather        d. 86  . Prostate cancer Maternal Uncle 79  . Breast cancer Maternal Aunt 75  . Breast cancer Maternal Aunt 68  . Cervical cancer Maternal Aunt 68  . Pancreatic cancer Maternal Aunt 54       d. 58y; heavy smoker  . Colon cancer Paternal Uncle 61       s/p partial colectomy; dx. second colon cancer at age 25-64    ALLERGIES:  is allergic to penicillins.  MEDICATIONS:  Current Outpatient Medications  Medication Sig Dispense Refill  . dexamethasone (DECADRON) 4 MG tablet  TAKE 2 TABLETS BY MOUTH WITH FOOD DAILY (START THE DAY AFTER CHEMOTHERAPY FOR 2 DAYS) 30 tablet 0  . Insulin Glargine (BASAGLAR KWIKPEN) 100 UNIT/ML SOPN Inject 70 Units into the skin every morning.     . lidocaine-prilocaine (EMLA) cream Apply to affected area once 30  g 3  . Multiple Vitamin (MULTIVITAMIN) tablet Take 1 tablet by mouth daily.    . ondansetron (ZOFRAN ODT) 4 MG disintegrating tablet Take 1 tablet (4 mg total) by mouth every 8 (eight) hours as needed for nausea or vomiting. 10 tablet 0  . ondansetron (ZOFRAN) 8 MG tablet Take 1 tablet (8 mg total) by mouth 2 (two) times daily as needed for refractory nausea / vomiting. Start on day 3 after chemotherapy. 30 tablet 1  . prochlorperazine (COMPAZINE) 10 MG tablet TAKE 1 TABLET BY MOUTH EVERY 6 HOURS AS NEEDED FOR NAUSEA / VOMITING 30 tablet 1  . traMADol (ULTRAM) 50 MG tablet TAKE 1 TABLET BY MOUTH EVERY 6 HOURS AS NEEDED FOR MODERATE / SEVERE PAIN 28 tablet 2   No current facility-administered medications for this visit.     REVIEW OF SYSTEMS:    A 10+ POINT REVIEW OF SYSTEMS WAS OBTAINED including neurology, dermatology, psychiatry, cardiac, respiratory, lymph, extremities, GI, GU, Musculoskeletal, constitutional, breasts, reproductive, HEENT.  All pertinent positives are noted in the HPI.  All others are negative.   PHYSICAL EXAMINATION:  ECOG PERFORMANCE STATUS: 1 - Symptomatic but completely ambulatory  GENERAL:alert, in no acute distress and comfortable SKIN: no acute rashes, no significant lesions EYES: conjunctiva are pink and non-injected, sclera anicteric OROPHARYNX: MMM, no exudates, no oropharyngeal erythema or ulceration NECK: supple, no JVD LYMPH:  no palpable lymphadenopathy in the cervical, axillary or inguinal regions LUNGS: clear to auscultation b/l with normal respiratory effort HEART: regular rate & rhythm ABDOMEN:  normoactive bowel sounds , non tender, not distended. No palpable hepatosplenomegaly. Healed  surgical incision.  Extremity: no pedal edema PSYCH: alert & oriented x 3 with fluent speech NEURO: no focal motor/sensory deficits   LABORATORY DATA:  I have reviewed the data as listed  .[name] CBC Latest Ref Rng & Units 05/19/2018 05/08/2018 03/17/2018  WBC 4.0 - 10.3 K/uL 7.6 7.8 7.2  Hemoglobin 13.0 - 17.1 g/dL 13.1 13.1 14.0  Hematocrit 38.4 - 49.9 % 40.5 40.9 42.7  Platelets 140 - 400 K/uL 235 246 157   . CMP Latest Ref Rng & Units 05/19/2018 05/08/2018 03/17/2018  Glucose 70 - 99 mg/dL 184(H) 137(H) 162(H)  BUN 6 - 20 mg/dL 11 10 14   Creatinine 0.61 - 1.24 mg/dL 0.84 0.79 1.00  Sodium 135 - 145 mmol/L 139 141 140  Potassium 3.5 - 5.1 mmol/L 4.4 3.8 4.5  Chloride 98 - 111 mmol/L 102 104 104  CO2 22 - 32 mmol/L 28 26 27   Calcium 8.9 - 10.3 mg/dL 9.8 9.6 9.6  Total Protein 6.5 - 8.1 g/dL 7.5 7.3 7.3  Total Bilirubin 0.3 - 1.2 mg/dL 1.2 2.1(H) 0.6  Alkaline Phos 38 - 126 U/L 228(H) 216(H) 115  AST 15 - 41 U/L 37 239(H) 23  ALT 0 - 44 U/L 81(H) 310(H) 34   04/14/18 Surgical Pathology:   .       Microscopic Comment 2. COLON AND RECTUM (INCLUDING TRANS-ANAL RESECTION): Specimen: Terminal ileum, right colon and appendix Procedure: Segmental resection Tumor site: Proximal ascending colon Specimen integrity: Intact Macroscopic intactness of mesorectum: Not applicable: x Complete: NA Near complete: NA Incomplete: NA Cannot be determined (specify): NA Macroscopic tumor perforation: The mass invades through muscularis propria into pericolonic soft tissue Invasive tumor: Maximum size: 5.5 cm Histologic type(s): Adenocarcinoma Histologic grade and differentiation: G3 G1: well differentiated/low grade 1 of 4 Supplemental copy SUPPLEMENTAL for Santizo, Axxel ([id number]) Microscopic Comment(continued) G2: moderately differentiated/low grade G3: poorly differentiated/high grade  G4: undifferentiated/high grade Type of polyp in which invasive carcinoma arose: Tubular  adenoma Microscopic extension of invasive tumor: The mass invade through the muscularis propria into pericolonic soft tissue Lymph-Vascular invasion: Identified Peri-neural invasion: Identified Tumor deposit(s) (discontinuous extramural extension): Present Resection margins: Proximal margin: Negative Distal margin: Negative Circumferential (radial) (posterior ascending, posterior descending; lateral and posterior mid-rectum; and entire lower 1/3 rectum):Negative Mesenteric margin (sigmoid and transverse): NA Distance closest margin (if all above margins negative): 3.7 cm from the non peritonealized pericolic soft tissue margin Trans-anal resection margins only: Deep margin: NA Mucosal Margin: NA Distance closest mucosal margin (if negative): NA Treatment effect (neo-adjuvant therapy): NA Additional polyp(s): Negative Non-neoplastic findings: Unremarkable Lymph nodes: number examined 18; number positive: 5 Pathologic Staging: pT3, N2a, M1a Ancillary studies: MSI ordered      RADIOGRAPHIC STUDIES: I have personally reviewed the radiological images as listed and agreed with the findings in the report. CT abd/pelvis 12/09/2017: IMPRESSION: 1. Heterogeneous hepatic steatosis. Given this mild limitation, no evidence of progressive hepatic metastasis. Similar nonspecific capsular irregularity along the inferior right hepatic lobe. 2. Suspect developing omental/peritoneal metastasis, as evidenced by new and increased omental nodularity. Trace cul-de-sac fluid is nonspecific but could also be secondary. 3.  No acute process or evidence of metastatic disease in the chest. 4. Age advanced coronary artery atherosclerosis. Recommend assessment of coronary risk factors and consideration of medical therapy. 5.  Aortic Atherosclerosis (ICD10-I70.0). 6. Possible developing cirrhosis.  Correlate with risk factors.   Electronically Signed   By: Abigail Miyamoto M.D.   On: 12/09/2017  14:23    MR LIVER WWO CONTRAST2/14/2019 [facility] Result Impression   1.Decreased sizes of multiple T2 hyperintense metastatic lesions (in the setting of patient's known colon adenocarcinoma with mucinous features) in both hepatic lobes (the majority of which are tiny). 2.No new lesions. 3.Posttreatment changes within the right hepatic lobe. 4.Other findings, as detailed above.  Result Narrative  MR LIVER WITH AND WITHOUT CONTRAST, 09/30/2017 10:20 AM  INDICATION: liver malignancy \ liver malignancy \ C18.9 Metastatic colon cancer to liver (HCC) \ C78.7 Metastatic colon cancer to liver ([place])  ADDITIONAL HISTORY: None. COMPARISON: MRI abdomen dated 07/01/2017.  TECHNIQUE: Multiplanar, multisequence MR images of the upper abdomen were obtained before and after intravenous administration of gadolinium-based contrast.   FINDINGS:  LOWER CHEST Heart: Normal size. No pericardial effusion. Lungs/pleura: No masses, consolidations, or effusions.  ABDOMEN Liver: Changes of prior radioembolization in the right hepatic lobe. Perfusional anomaly in the posterior right hepatic lobe, likely a venovenous shunt. Decreased sizes of multiple T2 hyperintense metastatic lesions throughout both hepatic lobes (the majority of which are tiny and are marked in PACS on series 5), with the dominant lesion measuring 10 mm in segment 8 (series 5, image 19), versus 12 mm previously. No new lesions. Gallbladder: Normal. No stones or inflammatory changes. Biliary: No obstruction. Spleen: Splenomegaly, with the spleen measuring 15.2 cm in span. Pancreas: Normal. Adrenals: Normal. Kidneys: No masses, stones, or obstruction. Unchanged fluid signal lesions in both kidneys, measuring up to 2.0 cm in the interpolar region of the left kidney. Stomach/bowel: Unremarkable.    ASSESSMENT & PLAN:   47 y.o. Caucasian male with  1)  Stage IV (pT3, N2a, M1a) Grade 3 invasive  adenocarcinoma of the ascending colon with lymphovascular and perineural invasion with slight leg nodules biopsy-proven liver metastasis. KRAS mutated BRAF, NRAS mutation neg MSI Stable.  PET/CT scan done on 06/25/2016 Show multiple foci of hypermetabolic hepatic metastases (atleast  4-5) and 2 hypermetabolic peritoneal nodules along the dorsal peritoneal surface concerning for peritoneal metastases.  MRI Liver 07/27/2016 - Multiple small liver metastases throughout the right and left hepatic lobes, largest measuring 2.1 cm. 1.2 cm enhancing peritoneal nodule in left paracolic gutter, suspicious for peritoneal metastasis. Other small peritoneal nodules better visualized on recent PET-CT which showed hypermetabolic activity, also suspicious for peritoneal metastases.   MRI Abd 10/12/2016 [facility] --shows improvement in all the liver and the 2 peritoneal lesions and no new lesions.  MRI liver and CT C/A/P on 01/01/2017 as noted above showed improved/stable disease.   MRI Liver 07/01/2017 at [facility] -  1.Three T2 hyperintense metastatic lesions are seen in the liver, with the largest again in segment 8. These appear unchanged from prior, again without appreciable enhancement or restricted diffusion. Several of the previously noted tiny lesions are no longer visualized. No new lesions identified.  2.Treatment related changes in the posterior right hepatic lobe. 3.A peritoneal nodule adjacent to the splenic flexure is now more conspicuous compared to the prior study but appears similar to more remote imaging and again could relate to neoplastic disease, although is indeterminate.  CT Abd/pelvis 08/23/2017: Interval resolution of previously seen small right hepatic lobe lesion. Stable tiny sub-cm nodule along the capsular surface of the right hepatic lobe. No new or progressive disease identified. Stable hepatic steatosis with areas of fatty sparing.  MRI 09/30/17:  Decreased sizes of multiple T2  hyperintense metastatic lesions (in the setting of patient's known colon adenocarcinoma with mucinous features) in both hepatic lobes (the majority of which are tiny). No new lesions. Posttreatment changes within the right hepatic lobe. Other findings, as detailed above.   12/09/17 CT C/A/P with pt which revealed Suspected developing omental/peritoneal metastasis, as evidenced by new and increased omental nodularity.  -CEA levels are slowing increasing and on 4/419 were increased to 11.87 and today are up to 27.26  02/10/18 MRI Liver Post treatment changes in the posterior right lobe. No significant change in the peripherally enhancing cystic lesion in segment 6 and another subcentimeter punctate T2 hyperintensity is identified in segment 7. The remaining previously reported lesions are not seen on today's exam. Continued attention on follow-up imaging is advised. 2.Interval development of multiple new peritoneal implants in the right anterior abdomen as detailed above and interval increase in size of the implant adjacent to the splenic flexure  02/21/18 CT C/A/P revealed Multiple scattered peritoneal implants in the upper to mid abdomen are similar compared to 02/10/2018. 2.Additional tiny soft tissue implants in the lower abdomen and pelvis are technically new compared to 01/01/2017. Advise continued attention on follow-up.   2) Mild thrombocytopenia PLT improved from 124k today(already have held 5FU bolus dose).   PLAN:  -Discussed pt labwork today, 05/19/18; blood counts are stable, transaminases are improving, bilirubin is normal. CEA is at 60.54.  -Discussed the 05/08/18 CT A/P which revealed Coronary arteriosclerosis. 2. Steatosis of the liver with indeterminate areas of hypodensity within the right hepatic lobe possibly representing post treatment change. New subtle lesions are not entirely excluded. Short-term interval follow-up and attention to these areas is recommended. MRI may also prove  useful for better assessment with IV contrast. 3. Left upper pole parapelvic cyst, stable in appearance measuring approximately 2 cm in diameter. No worrisome features. 4. Small mesenteric nodules consistent with mildly enlarged mesenteric lymph nodes or peritoneal metastasis are noted in the right hemiabdomen and ventral upper abdomen. These are not definitively identified on prior exam and  suspect these are new or have enlarged in the interim. 5. Mechanical bowel obstruction or inflammation. 6. New ventral midline surgical scarring post surgical change suspected in the mesentery. -Discussed the option to pursue second line treatment in the next 3 weeks given recent pancreatitis, with 5FU/LV and Irinotecan (FOLFIRI) -Will order pancreatic enzyme supplements -Recommend OTC Senna if opiate related constipation develops -Recommended that pt and wife speak with social work about disability benefits. -Recommended that the pt continue to eat well, drink at least 48-64 oz of water each day, and walk 20-30 minutes each day.  -Will see the pt back in 3 weeks  4) Iron deficiency anemia due to GI bleeding from the tumor and some blood loss with surgery. -resolved .  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 Counseling regarding advanced care planning and goals of care 05/27/2017  . 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 Genetic testing 10/30/2016  . Port catheter in place 09/25/2016  . Family history of colon cancer 07/24/2016  . Family history of prostate cancer 07/24/2016  . Metastatic colon cancer to liver ([place]) 07/01/2016  . Right Colon cancer metastasized to liver ([place]) 06/17/2016  . Chronic GI bleeding 05/29/2016  . Diabetes mellitus type 2, diet-controlled ([place]) 05/22/2016  . OSA (obstructive sleep apnea) 05/22/2016  . Hyperlipidemia 05/22/2016  .  Anemia due to blood loss, chronic   . Antineoplastic chemotherapy induced pancytopenia (CODE) ([place]) 05/21/2016   Plan:  -Continue follow-up with primary care physician [name], MD for optimization of diabetes management - now on basal insulin with improving control.  -Continue use of CPAP for sleep apnea.   -Schedule to start FOLFIRI chemotherapy q2weeks starting in 3 weeks with labs and MD visit     All of the patients questions were answered with apparent satisfaction. The patient knows to call the clinic with any problems, questions or concerns.  The total time spent in the appt was 45 minutes and more than 50% was on counseling and direct patient cares.    Sullivan Lone MD MS AAHIVMS [facility] Hematology/Oncology Physician [facility]  (Office):       [phone] (Work cell):  [phone] (Fax):           [phone]  I, Baldwin Jamaica, am acting as a scribe for Dr. Irene Limbo  .I have reviewed the above documentation for accuracy and completeness, and I agree with the above. Brunetta Genera MD

## 2018-05-19 ENCOUNTER — Inpatient Hospital Stay: Payer: 59

## 2018-05-19 ENCOUNTER — Encounter: Payer: Self-pay | Admitting: Hematology

## 2018-05-19 ENCOUNTER — Inpatient Hospital Stay: Payer: 59 | Attending: Hematology | Admitting: Hematology

## 2018-05-19 VITALS — BP 120/91 | HR 96 | Temp 98.3°F | Resp 18 | Ht 74.0 in | Wt 243.9 lb

## 2018-05-19 DIAGNOSIS — C182 Malignant neoplasm of ascending colon: Secondary | ICD-10-CM | POA: Insufficient documentation

## 2018-05-19 DIAGNOSIS — I2510 Atherosclerotic heart disease of native coronary artery without angina pectoris: Secondary | ICD-10-CM | POA: Diagnosis not present

## 2018-05-19 DIAGNOSIS — G893 Neoplasm related pain (acute) (chronic): Secondary | ICD-10-CM

## 2018-05-19 DIAGNOSIS — Z8042 Family history of malignant neoplasm of prostate: Secondary | ICD-10-CM | POA: Diagnosis not present

## 2018-05-19 DIAGNOSIS — M1990 Unspecified osteoarthritis, unspecified site: Secondary | ICD-10-CM

## 2018-05-19 DIAGNOSIS — C787 Secondary malignant neoplasm of liver and intrahepatic bile duct: Principal | ICD-10-CM

## 2018-05-19 DIAGNOSIS — C786 Secondary malignant neoplasm of retroperitoneum and peritoneum: Secondary | ICD-10-CM

## 2018-05-19 DIAGNOSIS — Z794 Long term (current) use of insulin: Secondary | ICD-10-CM

## 2018-05-19 DIAGNOSIS — Z801 Family history of malignant neoplasm of trachea, bronchus and lung: Secondary | ICD-10-CM | POA: Insufficient documentation

## 2018-05-19 DIAGNOSIS — R634 Abnormal weight loss: Secondary | ICD-10-CM

## 2018-05-19 DIAGNOSIS — Z803 Family history of malignant neoplasm of breast: Secondary | ICD-10-CM | POA: Diagnosis not present

## 2018-05-19 DIAGNOSIS — Z87891 Personal history of nicotine dependence: Secondary | ICD-10-CM | POA: Diagnosis not present

## 2018-05-19 DIAGNOSIS — Z9049 Acquired absence of other specified parts of digestive tract: Secondary | ICD-10-CM | POA: Diagnosis not present

## 2018-05-19 DIAGNOSIS — Z5111 Encounter for antineoplastic chemotherapy: Secondary | ICD-10-CM | POA: Diagnosis not present

## 2018-05-19 DIAGNOSIS — E785 Hyperlipidemia, unspecified: Secondary | ICD-10-CM | POA: Diagnosis not present

## 2018-05-19 DIAGNOSIS — D500 Iron deficiency anemia secondary to blood loss (chronic): Secondary | ICD-10-CM

## 2018-05-19 DIAGNOSIS — Z79899 Other long term (current) drug therapy: Secondary | ICD-10-CM | POA: Diagnosis not present

## 2018-05-19 DIAGNOSIS — K922 Gastrointestinal hemorrhage, unspecified: Secondary | ICD-10-CM | POA: Diagnosis not present

## 2018-05-19 DIAGNOSIS — K851 Biliary acute pancreatitis without necrosis or infection: Secondary | ICD-10-CM

## 2018-05-19 DIAGNOSIS — Z800 Family history of malignant neoplasm of digestive organs: Secondary | ICD-10-CM | POA: Diagnosis not present

## 2018-05-19 DIAGNOSIS — K760 Fatty (change of) liver, not elsewhere classified: Secondary | ICD-10-CM

## 2018-05-19 DIAGNOSIS — C189 Malignant neoplasm of colon, unspecified: Secondary | ICD-10-CM

## 2018-05-19 DIAGNOSIS — E119 Type 2 diabetes mellitus without complications: Secondary | ICD-10-CM | POA: Insufficient documentation

## 2018-05-19 DIAGNOSIS — Z7189 Other specified counseling: Secondary | ICD-10-CM

## 2018-05-19 DIAGNOSIS — Z95828 Presence of other vascular implants and grafts: Secondary | ICD-10-CM

## 2018-05-19 LAB — CBC WITH DIFFERENTIAL/PLATELET
Basophils Absolute: 0.1 10*3/uL (ref 0.0–0.1)
Basophils Relative: 1 %
Eosinophils Absolute: 0.5 10*3/uL (ref 0.0–0.5)
Eosinophils Relative: 6 %
HEMATOCRIT: 40.5 % (ref 38.4–49.9)
Hemoglobin: 13.1 g/dL (ref 13.0–17.1)
LYMPHS PCT: 14 %
Lymphs Abs: 1.1 10*3/uL (ref 0.9–3.3)
MCH: 28.7 pg (ref 27.2–33.4)
MCHC: 32.3 g/dL (ref 32.0–36.0)
MCV: 88.8 fL (ref 79.3–98.0)
MONO ABS: 1 10*3/uL — AB (ref 0.1–0.9)
MONOS PCT: 13 %
NEUTROS ABS: 5 10*3/uL (ref 1.5–6.5)
Neutrophils Relative %: 66 %
PLATELETS: 235 10*3/uL (ref 140–400)
RBC: 4.56 MIL/uL (ref 4.20–5.82)
RDW: 13.7 % (ref 11.0–14.6)
WBC: 7.6 10*3/uL (ref 4.0–10.3)

## 2018-05-19 LAB — CMP (CANCER CENTER ONLY)
ALBUMIN: 3.8 g/dL (ref 3.5–5.0)
ALK PHOS: 228 U/L — AB (ref 38–126)
ALT: 81 U/L — ABNORMAL HIGH (ref 0–44)
ANION GAP: 9 (ref 5–15)
AST: 37 U/L (ref 15–41)
BUN: 11 mg/dL (ref 6–20)
CO2: 28 mmol/L (ref 22–32)
Calcium: 9.8 mg/dL (ref 8.9–10.3)
Chloride: 102 mmol/L (ref 98–111)
Creatinine: 0.84 mg/dL (ref 0.61–1.24)
GFR, Est AFR Am: >60 mL/min (ref >60)
GFR, Estimated: >60 mL/min (ref >60)
GLUCOSE: 184 mg/dL — AB (ref 70–99)
POTASSIUM: 4.4 mmol/L (ref 3.5–5.1)
Sodium: 139 mmol/L (ref 135–145)
Total Bilirubin: 1.2 mg/dL (ref 0.3–1.2)
Total Protein: 7.5 g/dL (ref 6.5–8.1)

## 2018-05-19 LAB — CEA (IN HOUSE-CHCC): CEA (CHCC-In House): 60.54 ng/mL — ABNORMAL HIGH (ref 0.00–5.00)

## 2018-05-19 MED ORDER — PANCRELIPASE (LIP-PROT-AMYL) 12000-38000 UNITS PO CPEP: 12000.0000 [IU] | ORAL_CAPSULE | Freq: Three times a day (TID) | ORAL | 0 refills | Status: DC

## 2018-05-19 MED ORDER — SODIUM CHLORIDE 0.9% FLUSH
10.0000 mL | INTRAVENOUS | Status: DC | PRN
  Administered 2018-05-19: 10 mL via INTRAVENOUS
  Filled 2018-05-19: qty 10

## 2018-05-19 MED ORDER — HEPARIN SOD (PORK) LOCK FLUSH 100 UNIT/ML IV SOLN
500.0000 [IU] | Freq: Once | INTRAVENOUS | Status: AC | PRN
  Administered 2018-05-19: 500 [IU] via INTRAVENOUS
  Filled 2018-05-19: qty 5

## 2018-05-19 MED ORDER — OXYCODONE HCL 5 MG PO TABS: 5.0000 mg | ORAL_TABLET | ORAL | 0 refills | Status: DC | PRN

## 2018-05-20 ENCOUNTER — Encounter: Payer: Self-pay | Admitting: General Practice

## 2018-05-20 DIAGNOSIS — E1165 Type 2 diabetes mellitus with hyperglycemia: Secondary | ICD-10-CM | POA: Diagnosis not present

## 2018-05-20 DIAGNOSIS — C189 Malignant neoplasm of colon, unspecified: Secondary | ICD-10-CM | POA: Diagnosis not present

## 2018-05-20 NOTE — Progress Notes (Signed)
[facility] CSW Progress Notes  Wife, Anderson Malta, comes to [employer] on recommendation of MD for caregiver support.  Is feeling stress of caregiving and uncertainty re disease process. Validated feelings, provided support/encouragement.  Referred to various supportive resources.  Linked to counseling intern who will reach out to her to schedule appointment for intake.  Also linked to [facility] via [employer] for help navigating practical and legal affairs.  Provided my card so she can access CSW as needed in future.   Edwyna Shell, LCSW Clinical Social Worker Phone:  [phone]

## 2018-06-07 ENCOUNTER — Other Ambulatory Visit: Payer: Self-pay | Admitting: Hematology

## 2018-06-07 MED ORDER — OXYCODONE HCL 5 MG PO TABS: 5.0000 mg | ORAL_TABLET | ORAL | 0 refills | Status: DC | PRN

## 2018-06-09 ENCOUNTER — Inpatient Hospital Stay: Payer: 59

## 2018-06-09 ENCOUNTER — Inpatient Hospital Stay (HOSPITAL_BASED_OUTPATIENT_CLINIC_OR_DEPARTMENT_OTHER): Payer: 59 | Admitting: Hematology

## 2018-06-09 ENCOUNTER — Encounter: Payer: Self-pay | Admitting: Hematology

## 2018-06-09 ENCOUNTER — Telehealth: Payer: Self-pay | Admitting: Hematology

## 2018-06-09 ENCOUNTER — Telehealth: Payer: Self-pay

## 2018-06-09 VITALS — BP 111/82 | HR 97 | Temp 98.3°F | Resp 18 | Ht 74.0 in | Wt 243.0 lb

## 2018-06-09 DIAGNOSIS — C182 Malignant neoplasm of ascending colon: Secondary | ICD-10-CM

## 2018-06-09 DIAGNOSIS — C189 Malignant neoplasm of colon, unspecified: Secondary | ICD-10-CM

## 2018-06-09 DIAGNOSIS — C786 Secondary malignant neoplasm of retroperitoneum and peritoneum: Secondary | ICD-10-CM

## 2018-06-09 DIAGNOSIS — C787 Secondary malignant neoplasm of liver and intrahepatic bile duct: Principal | ICD-10-CM

## 2018-06-09 LAB — CBC WITH DIFFERENTIAL/PLATELET
Abs Immature Granulocytes: 0.03 10*3/uL (ref 0.00–0.07)
BASOS ABS: 0.1 10*3/uL (ref 0.0–0.1)
BASOS PCT: 1 %
EOS ABS: 0.4 10*3/uL (ref 0.0–0.5)
EOS PCT: 4 %
HCT: 39.4 % (ref 39.0–52.0)
Hemoglobin: 12.6 g/dL — ABNORMAL LOW (ref 13.0–17.0)
Immature Granulocytes: 0 %
Lymphocytes Relative: 11 %
Lymphs Abs: 0.9 10*3/uL (ref 0.7–4.0)
MCH: 27.3 pg (ref 26.0–34.0)
MCHC: 32 g/dL (ref 30.0–36.0)
MCV: 85.3 fL (ref 80.0–100.0)
MONO ABS: 0.9 10*3/uL (ref 0.1–1.0)
Monocytes Relative: 10 %
NRBC: 0 % (ref 0.0–0.2)
Neutro Abs: 6.2 10*3/uL (ref 1.7–7.7)
Neutrophils Relative %: 74 %
Platelets: 217 10*3/uL (ref 150–400)
RBC: 4.62 MIL/uL (ref 4.22–5.81)
RDW: 13 % (ref 11.5–15.5)
WBC: 8.5 10*3/uL (ref 4.0–10.5)

## 2018-06-09 LAB — CMP (CANCER CENTER ONLY)
ALBUMIN: 3.9 g/dL (ref 3.5–5.0)
ALK PHOS: 151 U/L — AB (ref 38–126)
ALT: 22 U/L (ref 0–44)
ANION GAP: 11 (ref 5–15)
AST: 23 U/L (ref 15–41)
BUN: 12 mg/dL (ref 6–20)
CALCIUM: 9.3 mg/dL (ref 8.9–10.3)
CO2: 26 mmol/L (ref 22–32)
Chloride: 101 mmol/L (ref 98–111)
Creatinine: 0.83 mg/dL (ref 0.61–1.24)
GFR, Est AFR Am: >60 mL/min (ref >60)
GFR, Estimated: >60 mL/min (ref >60)
GLUCOSE: 145 mg/dL — AB (ref 70–99)
POTASSIUM: 4.1 mmol/L (ref 3.5–5.1)
SODIUM: 138 mmol/L (ref 135–145)
TOTAL PROTEIN: 7.3 g/dL (ref 6.5–8.1)
Total Bilirubin: 1 mg/dL (ref 0.3–1.2)

## 2018-06-09 LAB — URINALYSIS, COMPLETE (UACMP) WITH MICROSCOPIC
Bilirubin Urine: NEGATIVE
Glucose, UA: NEGATIVE mg/dL
Hgb urine dipstick: NEGATIVE
Ketones, ur: NEGATIVE mg/dL
Leukocytes, UA: NEGATIVE
Nitrite: NEGATIVE
PH: 5 (ref 5.0–8.0)
PROTEIN: NEGATIVE mg/dL
Specific Gravity, Urine: 1.017 (ref 1.005–1.030)

## 2018-06-09 LAB — AMYLASE: AMYLASE: 51 U/L (ref 28–100)

## 2018-06-09 LAB — LIPASE, BLOOD: Lipase: 84 U/L — ABNORMAL HIGH (ref 11–51)

## 2018-06-09 LAB — CEA (IN HOUSE-CHCC): CEA (CHCC-In House): 107.75 ng/mL — ABNORMAL HIGH (ref 0.00–5.00)

## 2018-06-09 MED ORDER — SODIUM CHLORIDE 0.9 % IV SOLN
2400.0000 mg/m2 | INTRAVENOUS | Status: DC
  Administered 2018-06-09: 5800 mg via INTRAVENOUS
  Filled 2018-06-09: qty 116

## 2018-06-09 MED ORDER — SODIUM CHLORIDE 0.9 % IV SOLN
Freq: Once | INTRAVENOUS | Status: AC
  Administered 2018-06-09: 10:00:00 via INTRAVENOUS
  Filled 2018-06-09: qty 250

## 2018-06-09 MED ORDER — HEPARIN SOD (PORK) LOCK FLUSH 100 UNIT/ML IV SOLN
500.0000 [IU] | Freq: Once | INTRAVENOUS | Status: DC | PRN
  Filled 2018-06-09: qty 5

## 2018-06-09 MED ORDER — POLYETHYLENE GLYCOL 3350 17 G PO PACK: 17.0000 g | PACK | Freq: Every day | ORAL | 1 refills | Status: DC

## 2018-06-09 MED ORDER — SENNOSIDES-DOCUSATE SODIUM 8.6-50 MG PO TABS: 2.0000 | ORAL_TABLET | Freq: Every day | ORAL | 1 refills | Status: DC

## 2018-06-09 MED ORDER — OXYCODONE-ACETAMINOPHEN 5-325 MG PO TABS
2.0000 | ORAL_TABLET | Freq: Once | ORAL | Status: AC
  Administered 2018-06-09: 2 via ORAL

## 2018-06-09 MED ORDER — OXYCODONE-ACETAMINOPHEN 5-325 MG PO TABS
ORAL_TABLET | ORAL | Status: AC
  Filled 2018-06-09: qty 2

## 2018-06-09 MED ORDER — FENTANYL 25 MCG/HR TD PT72: 25.0000 ug | MEDICATED_PATCH | TRANSDERMAL | 0 refills | Status: DC

## 2018-06-09 MED ORDER — SODIUM CHLORIDE 0.9% FLUSH
10.0000 mL | INTRAVENOUS | Status: DC | PRN
  Filled 2018-06-09: qty 10

## 2018-06-09 MED ORDER — DEXAMETHASONE SODIUM PHOSPHATE 10 MG/ML IJ SOLN
10.0000 mg | Freq: Once | INTRAMUSCULAR | Status: AC
  Administered 2018-06-09: 10 mg via INTRAVENOUS

## 2018-06-09 MED ORDER — DEXAMETHASONE SODIUM PHOSPHATE 10 MG/ML IJ SOLN
INTRAMUSCULAR | Status: AC
  Filled 2018-06-09: qty 1

## 2018-06-09 MED ORDER — PALONOSETRON HCL INJECTION 0.25 MG/5ML
0.2500 mg | Freq: Once | INTRAVENOUS | Status: AC
  Administered 2018-06-09: 0.25 mg via INTRAVENOUS

## 2018-06-09 MED ORDER — LEUCOVORIN CALCIUM INJECTION 350 MG
400.0000 mg/m2 | Freq: Once | INTRAVENOUS | Status: AC
  Administered 2018-06-09: 964 mg via INTRAVENOUS
  Filled 2018-06-09: qty 48.2

## 2018-06-09 MED ORDER — FLUOROURACIL CHEMO INJECTION 2.5 GM/50ML
400.0000 mg/m2 | Freq: Once | INTRAVENOUS | Status: AC
  Administered 2018-06-09: 950 mg via INTRAVENOUS
  Filled 2018-06-09: qty 19

## 2018-06-09 MED ORDER — PALONOSETRON HCL INJECTION 0.25 MG/5ML
INTRAVENOUS | Status: AC
  Filled 2018-06-09: qty 5

## 2018-06-09 NOTE — Progress Notes (Signed)
Scott Gallagher    HEMATOLOGY/ONCOLOGY CLINIC NOTE  Date of Service: 06/09/18    Patient Care Team: [name], MD as PCP - General (Family Medicine) [name] M.D. 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 KRAS mutated BRAF, NRAS mutation neg MSI Stable.  PET/CT scan done on 06/25/2016 Show multiple foci of hypermetabolic hepatic metastases (atleast 4-5) and 2 hypermetabolic peritoneal nodules along the dorsal peritoneal surface concerning for peritoneal metastases.  MRI Liver 07/27/2016 - Multiple small liver metastases throughout the right and left hepatic lobes, largest measuring 2.1 cm. 1.2 cm enhancing peritoneal nodule in left paracolic gutter, suspicious for peritoneal metastasis. Other small peritoneal nodules better visualized on recent PET-CT which showed hypermetabolic activity, also suspicious for peritoneal metastases.   CT CHEST WO CONTRAST 10/12/2016  [facility] Result Impression   1. Congenital variant of bilateral middle lobes. 2. Groundglass opacification with regions of bronchiectasis in the right lower lobe adjacent to the fissure, consistent with inflammatory/infectious changes. 3. No definitive evidence of metastatic disease to the chest. 4. Probable sebaceous cyst in the subcutaneous tissues beneath the left anterior chest.   MR ABDOMEN AND PELVIS [id number] FOLLOW UP2/26/2018 [facility] Result Impression    Decreased size of multiple hepatic lesions and two peritoneal nodules compared to outside MRI 07/27/2016. No new lesions identified in  the abdomen or pelvis.     PREVIOUS TREATMENT  FOLFOX x 10 cycles. 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 (without 5FU bolus - due to thrombocytopenia). S/p Y-90 treatment to 1 hepatic lobe.  Plan for prn hepatic Y90 radio-embolization   INTERVAL HISTORY:     [name] is here for follow-up for his metastatic colon cancer and for next cycle of 5FU/leucovorin for maintenance therapy. The patient's last visit with [place] was on 05/19/18. He is accompanied today by his wife. The pt reports that he is doing well overall.   The pt reports that he has had pain in his gut, lower abdomen, and in his lower back. The pt notes that his lower back pain has kept him from sleeping well. He notes that when he feels pain when he urinates as well. The pt notes that he is using Oxycodone every 4 hours, for a total of between 6-8 pills a day.  The pt notes that he has not had much pain in his upper abdomen when eating, and notes that he hasn't been eating very well- about half of what he would typically eat. He adds that he has taken his pancreatic enzyme supplement when he remembers to. The pt notes that his abdomen feels swollen, and he is not moving his bowels every day, but denies a sense of overt constipation, and is concerned he may have a blockage. The pt denies nausea or vomiting, and has not used a laxative. He notes that he last moved his bowels 3-4 day ago. The pt notes that fruit and vegetables make his abdomen feel worse, and has abided by a low fat diet.  The pt notes that he has had intermittent sweating, and has measured his temperature of up to 99.9 degrees farenheit.   The pt notes that there are no open areas from his surgical incision, and adds that it is healing well.   Lab results today (06/09/18) of CBC w/diff, CMP is as follows: all values are  WNL except for HGB at 12.6, Glucose at 145, Alk Phos at 151. 06/09/18 CEA is pending  On review of systems, pt reports weak appetite, intermittent sweating, feelings of abdominal bloating, lower back pain, pain when urinating, and denies nausea, vomiting, moving his bowels well, overt fevers, upper abdominal pain while eating, and any other symptoms.   MEDICAL HISTORY:  Past Medical History:  Diagnosis Date  . Anemia   . Arthritis   . Colon cancer ([place]) dx'd 05/2016  . Diabetes mellitus without complication (HCC)    diet controlled  . History of blood transfusion   . Hyperlipidemia   . liver mets dx'd 05/2016  . Sleep apnea    cpap  . Wears glasses     SURGICAL HISTORY: Past Surgical History:  Procedure Laterality Date  . COLON SURGERY    . IR GENERIC HISTORICAL  09/24/2016   IR CV LINE INJECTION 09/24/2016 [facility]  . IR GENERIC HISTORICAL  09/24/2016   IR US GUIDE VASC ACCESS RIGHT 09/24/2016 [facility]  . IR GENERIC HISTORICAL  09/24/2016   IR FLUORO GUIDE CV LINE RIGHT 09/24/2016 [facility]  . IR GENERIC HISTORICAL  09/30/2016   IR FLUORO GUIDE PORT INSERTION RIGHT 09/30/2016 Markus Daft, MD [facility]  . IR GENERIC HISTORICAL  09/30/2016   IR US GUIDE VASC ACCESS RIGHT 09/30/2016 Markus Daft, MD [facility]  . IR GENERIC HISTORICAL  09/30/2016   IR REMOVAL TUN ACCESS W/ PORT W/O FL MOD SED 09/30/2016 Markus Daft, MD [facility]  . LAPAROSCOPIC RIGHT HEMI COLECTOMY Right 06/17/2016   Procedure: LAPAROSCOPIC ASSISTED  RIGHT HEMI COLECTOMY;  Surgeon: [name], MD;  Location: [facility];  Service: General;  Laterality: Right;  . PORTACATH PLACEMENT Left 07/13/2016   Procedure: INSERTION PORT-A-CATH left subclavian;  Surgeon: [name], MD;  Location: [facility];  Service: General;  Laterality: Left;  . SHOULDER ACROMIOPLASTY Right 12/20/2014   Procedure: SHOULDER ACROMIOPLASTY;  Surgeon: Melrose Nakayama, MD;  Location: [place];  Service: Orthopedics;  Laterality:  Right;  . SHOULDER ARTHROSCOPY Right 12/20/2014   Procedure: RIGHT ARTHROSCOPY SHOULDER WITH DEBRIDEMENT;  Surgeon: Melrose Nakayama, MD;  Location: [facility];  Service: Orthopedics;  Laterality: Right;  . TESTICLE SURGERY     as teen  . TONSILLECTOMY    . WISDOM TOOTH EXTRACTION      SOCIAL HISTORY: Social History   Socioeconomic History  . Marital status: Married    Spouse name: Not on file  . Number of children: Not on file  . Years of education: Not on file  . Highest education level: Not on file  Occupational History  . Occupation: cemetery maintenance  Social Needs  . Financial resource strain: Not on file  . Food insecurity:    Worry: Not on file    Inability: Not on file  . Transportation needs:    Medical: Not on file    Non-medical: Not on file  Tobacco Use  . Smoking status: Former Smoker    Packs/day: 1.50    Years: 29.00    Pack years: 43.50    Types: Cigarettes    Last  attempt to quit: 12/16/2012    Years since quitting: 5.4  . Smokeless tobacco: Never Used  . Tobacco comment: 1-2 ppd from age [age]y to 2014  Substance and Sexual Activity  . Alcohol use: Yes    Alcohol/week: 3.0 standard drinks    Types: 3 Shots of liquor per week    Comment:  some days  . Drug use: No  . Sexual activity: Not on file  Lifestyle  . Physical activity:    Days per week: Not on file    Minutes per session: Not on file  . Stress: Not on file  Relationships  . Social connections:    Talks on phone: Not on file    Gets together: Not on file    Attends religious service: Not on file    Active member of club or organization: Not on file    Attends meetings of clubs or organizations: Not on file    Relationship status: Not on file  . Intimate partner violence:    Fear of current or ex partner: Not on file    Emotionally abused: Not on file    Physically abused: Not on file    Forced sexual activity: Not on file  Other Topics Concern  . Not on file  Social  History Narrative  . Not on file    FAMILY HISTORY: Family History  Problem Relation Age of Onset  . Diabetes Other   . Hyperlipidemia Other   . Hypertension Other   . Breast cancer Maternal Aunt 70  . Prostate cancer Maternal Uncle 75  . Lung cancer Paternal Uncle 43  . Stroke Maternal Grandfather 72  . Cancer Paternal Grandmother 39       dx cancer of pancreas and colon, unknown if separate primaries  . Heart Problems Paternal Grandfather        d. 10  . Prostate cancer Maternal Uncle 8  . Breast cancer Maternal Aunt 75  . Breast cancer Maternal Aunt 68  . Cervical cancer Maternal Aunt 68  . Pancreatic cancer Maternal Aunt 54       d. 58y; heavy smoker  . Colon cancer Paternal Uncle 61       s/p partial colectomy; dx. second colon cancer at age 52-64    ALLERGIES:  is allergic to penicillins.  MEDICATIONS:  Current Outpatient Medications  Medication Sig Dispense Refill  . insulin lispro (HUMALOG) 100 UNIT/ML injection Inject into the skin 3 (three) times daily before meals.    Scott Gallagher dexamethasone (DECADRON) 4 MG tablet TAKE 2 TABLETS BY MOUTH WITH FOOD DAILY (START THE DAY AFTER CHEMOTHERAPY FOR 2 DAYS) 30 tablet 0  . fentaNYL (DURAGESIC - DOSED MCG/HR) 25 MCG/HR patch Place 1 patch (25 mcg total) onto the skin every 3 (three) days. 10 patch 0  . Insulin Glargine (BASAGLAR KWIKPEN) 100 UNIT/ML SOPN Inject 70 Units into the skin every morning.     . lidocaine-prilocaine (EMLA) cream Apply to affected area once 30 g 3  . lipase/protease/amylase (CREON) 12000 units CPEP capsule Take 1 capsule (12,000 Units total) by mouth 3 (three) times daily before meals. 90 capsule 0  . Multiple Vitamin (MULTIVITAMIN) tablet Take 1 tablet by mouth daily.    . ondansetron (ZOFRAN ODT) 4 MG disintegrating tablet Take 1 tablet (4 mg total) by mouth every 8 (eight) hours as needed for nausea or vomiting. 10 tablet 0  . ondansetron (ZOFRAN) 8 MG tablet Take 1 tablet (8 mg total) by mouth 2 (two)  times daily as needed for refractory nausea / vomiting. Start on day 3 after chemotherapy. 30 tablet 1  . oxyCODONE (OXY IR/ROXICODONE) 5 MG immediate release tablet Take 1-2 tablets (5-10 mg total) by mouth every 4 (four) hours as needed for severe pain. 60 tablet 0  . polyethylene glycol (MIRALAX) packet Take 17 g by mouth daily. 30 each 1  . prochlorperazine (COMPAZINE) 10 MG tablet TAKE 1 TABLET BY MOUTH EVERY 6 HOURS AS NEEDED FOR NAUSEA / VOMITING 30 tablet 1  . senna-docusate (SENNA S) 8.6-50 MG tablet Take 2 tablets by mouth at bedtime. 60 tablet 1  . traMADol (ULTRAM) 50 MG tablet TAKE 1 TABLET BY MOUTH EVERY 6 HOURS AS NEEDED FOR MODERATE / SEVERE PAIN 28 tablet 2   No current facility-administered medications for this visit.     REVIEW OF SYSTEMS:    A 10+ POINT REVIEW OF SYSTEMS WAS OBTAINED including neurology, dermatology, psychiatry, cardiac, respiratory, lymph, extremities, GI, GU, Musculoskeletal, constitutional, breasts, reproductive, HEENT.  All pertinent positives are noted in the HPI.  All others are negative.    PHYSICAL EXAMINATION:  ECOG PERFORMANCE STATUS: 1 - Symptomatic but completely ambulatory  GENERAL:alert, in no acute distress and comfortable SKIN: no acute rashes, no significant lesions EYES: conjunctiva are pink and non-injected, sclera anicteric OROPHARYNX: MMM, no exudates, no oropharyngeal erythema or ulceration NECK: supple, no JVD LYMPH:  no palpable lymphadenopathy in the cervical, axillary or inguinal regions LUNGS: clear to auscultation b/l with normal respiratory effort HEART: regular rate & rhythm ABDOMEN:  normoactive bowel sounds , some mild tenderness to palpation in left central abdomen, not distended. No palpable hepatosplenomegaly. Healed surgical incision.  Extremity: no pedal edema PSYCH: alert & oriented x 3 with fluent speech NEURO: no focal motor/sensory deficits   LABORATORY DATA:  I have reviewed the data as listed  .Scott Gallagher CBC  Latest Ref Rng & Units 06/09/2018 05/19/2018 05/08/2018  WBC 4.0 - 10.5 K/uL 8.5 7.6 7.8  Hemoglobin 13.0 - 17.0 g/dL 12.6(L) 13.1 13.1  Hematocrit 39.0 - 52.0 % 39.4 40.5 40.9  Platelets 150 - 400 K/uL 217 235 246   . CMP Latest Ref Rng & Units 06/09/2018 05/19/2018 05/08/2018  Glucose 70 - 99 mg/dL 145(H) 184(H) 137(H)  BUN 6 - 20 mg/dL 12 11 10   Creatinine 0.61 - 1.24 mg/dL 0.83 0.84 0.79  Sodium 135 - 145 mmol/L 138 139 141  Potassium 3.5 - 5.1 mmol/L 4.1 4.4 3.8  Chloride 98 - 111 mmol/L 101 102 104  CO2 22 - 32 mmol/L 26 28 26   Calcium 8.9 - 10.3 mg/dL 9.3 9.8 9.6  Total Protein 6.5 - 8.1 g/dL 7.3 7.5 7.3  Total Bilirubin 0.3 - 1.2 mg/dL 1.0 1.2 2.1(H)  Alkaline Phos 38 - 126 U/L 151(H) 228(H) 216(H)  AST 15 - 41 U/L 23 37 239(H)  ALT 0 - 44 U/L 22 81(H) 310(H)   04/14/18 Surgical Pathology:   .       Microscopic Comment 2. COLON AND RECTUM (INCLUDING TRANS-ANAL RESECTION): Specimen: Terminal ileum, right colon and appendix Procedure: Segmental resection Tumor site: Proximal ascending colon Specimen integrity: Intact Macroscopic intactness of mesorectum: Not applicable: x Complete: NA Near complete: NA Incomplete: NA Cannot be determined (specify): NA Macroscopic tumor perforation: The mass invades through muscularis propria into pericolonic soft tissue Invasive tumor: Maximum size: 5.5 cm Histologic type(s): Adenocarcinoma Histologic grade and differentiation: G3 G1: well differentiated/low grade 1 of 4 Supplemental copy SUPPLEMENTAL for [place], [place]isha[place] ([id number]) Microscopic Comment(continued)  G2: moderately differentiated/low grade G3: poorly differentiated/high grade G4: undifferentiated/high grade Type of polyp in which invasive carcinoma arose: Tubular adenoma Microscopic extension of invasive tumor: The mass invade through the muscularis propria into pericolonic soft tissue Lymph-Vascular invasion: Identified Peri-neural invasion: Identified Tumor  deposit(s) (discontinuous extramural extension): Present Resection margins: Proximal margin: Negative Distal margin: Negative Circumferential (radial) (posterior ascending, posterior descending; lateral and posterior mid-rectum; and entire lower 1/3 rectum):Negative Mesenteric margin (sigmoid and transverse): NA Distance closest margin (if all above margins negative): 3.7 cm from the non peritonealized pericolic soft tissue margin Trans-anal resection margins only: Deep margin: NA Mucosal Margin: NA Distance closest mucosal margin (if negative): NA Treatment effect (neo-adjuvant therapy): NA Additional polyp(s): Negative Non-neoplastic findings: Unremarkable Lymph nodes: number examined 18; number positive: 5 Pathologic Staging: pT3, N2a, M1a Ancillary studies: MSI ordered      RADIOGRAPHIC STUDIES: I have personally reviewed the radiological images as listed and agreed with the findings in the report. CT abd/pelvis 12/09/2017: IMPRESSION: 1. Heterogeneous hepatic steatosis. Given this mild limitation, no evidence of progressive hepatic metastasis. Similar nonspecific capsular irregularity along the inferior right hepatic lobe. 2. Suspect developing omental/peritoneal metastasis, as evidenced by new and increased omental nodularity. Trace cul-de-sac fluid is nonspecific but could also be secondary. 3.  No acute process or evidence of metastatic disease in the chest. 4. Age advanced coronary artery atherosclerosis. Recommend assessment of coronary risk factors and consideration of medical therapy. 5.  Aortic Atherosclerosis (ICD10-I70.0). 6. Possible developing cirrhosis.  Correlate with risk factors.   Electronically Signed   By: Abigail Miyamoto M.D.   On: 12/09/2017 14:23    MR LIVER WWO CONTRAST2/14/2019 [facility] Result Impression   1.Decreased sizes of multiple T2 hyperintense metastatic lesions (in the setting of patient's known colon  adenocarcinoma with mucinous features) in both hepatic lobes (the majority of which are tiny). 2.No new lesions. 3.Posttreatment changes within the right hepatic lobe. 4.Other findings, as detailed above.  Result Narrative  MR LIVER WITH AND WITHOUT CONTRAST, 09/30/2017 10:20 AM  INDICATION: liver malignancy \ liver malignancy \ C18.9 Metastatic colon cancer to liver (HCC) \ C78.7 Metastatic colon cancer to liver ([place])  ADDITIONAL HISTORY: None. COMPARISON: MRI abdomen dated 07/01/2017.  TECHNIQUE: Multiplanar, multisequence MR images of the upper abdomen were obtained before and after intravenous administration of gadolinium-based contrast.   FINDINGS:  LOWER CHEST Heart: Normal size. No pericardial effusion. Lungs/pleura: No masses, consolidations, or effusions.  ABDOMEN Liver: Changes of prior radioembolization in the right hepatic lobe. Perfusional anomaly in the posterior right hepatic lobe, likely a venovenous shunt. Decreased sizes of multiple T2 hyperintense metastatic lesions throughout both hepatic lobes (the majority of which are tiny and are marked in PACS on series 5), with the dominant lesion measuring 10 mm in segment 8 (series 5, image 19), versus 12 mm previously. No new lesions. Gallbladder: Normal. No stones or inflammatory changes. Biliary: No obstruction. Spleen: Splenomegaly, with the spleen measuring 15.2 cm in span. Pancreas: Normal. Adrenals: Normal. Kidneys: No masses, stones, or obstruction. Unchanged fluid signal lesions in both kidneys, measuring up to 2.0 cm in the interpolar region of the left kidney. Stomach/bowel: Unremarkable.    ASSESSMENT & PLAN:   47 y.o. 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 KRAS mutated BRAF, NRAS mutation neg MSI Stable.  PET/CT scan done on 06/25/2016  Show multiple foci of hypermetabolic hepatic metastases (atleast 4-5) and 2 hypermetabolic peritoneal nodules along the dorsal peritoneal surface concerning for peritoneal metastases.  MRI Liver 07/27/2016 - Multiple small liver metastases throughout the right and left hepatic lobes, largest measuring 2.1 cm. 1.2 cm enhancing peritoneal nodule in left paracolic gutter, suspicious for peritoneal metastasis. Other small peritoneal nodules better visualized on recent PET-CT which showed hypermetabolic activity, also suspicious for peritoneal metastases.   MRI Abd 10/12/2016 at [facility] --shows improvement in all the liver and the 2 peritoneal lesions and no new lesions.  MRI liver and CT C/A/P on 01/01/2017 as noted above showed improved/stable disease.   MRI Liver 07/01/2017 at [facility] -  1.Three T2 hyperintense metastatic lesions are seen in the liver, with the largest again in segment 8. These appear unchanged from prior, again without appreciable enhancement or restricted diffusion. Several of the previously noted tiny lesions are no longer visualized. No new lesions identified.  2.Treatment related changes in the posterior right hepatic lobe. 3.A peritoneal nodule adjacent to the splenic flexure is now more conspicuous compared to the prior study but appears similar to more remote imaging and again could relate to neoplastic disease, although is indeterminate.  CT Abd/pelvis 08/23/2017: Interval resolution of previously seen small right hepatic lobe lesion. Stable tiny sub-cm nodule along the capsular surface of the right hepatic lobe. No new or progressive disease identified. Stable hepatic steatosis with areas of fatty sparing.  MRI 09/30/17:  Decreased sizes of multiple T2 hyperintense metastatic lesions (in the setting of patient's known colon adenocarcinoma with mucinous features) in both hepatic lobes (the majority of which are tiny). No new lesions. Posttreatment changes within the right  hepatic lobe. Other findings, as detailed above.   12/09/17 CT C/A/P with pt which revealed Suspected developing omental/peritoneal metastasis, as evidenced by new and increased omental nodularity.  -CEA levels are slowing increasing and on 4/419 were increased to 11.87 and today are up to 27.26  02/10/18 MRI Liver Post treatment changes in the posterior right lobe. No significant change in the peripherally enhancing cystic lesion in segment 6 and another subcentimeter punctate T2 hyperintensity is identified in segment 7. The remaining previously reported lesions are not seen on today's exam. Continued attention on follow-up imaging is advised. 2.Interval development of multiple new peritoneal implants in the right anterior abdomen as detailed above and interval increase in size of the implant adjacent to the splenic flexure  02/21/18 CT C/A/P revealed Multiple scattered peritoneal implants in the upper to mid abdomen are similar compared to 02/10/2018.2.Additional tiny soft tissue implants in the lower abdomen and pelvis are technically new compared to 01/01/2017. Advise continued attention on follow-up.   05/08/18 CT A/P revealed Coronary arteriosclerosis. 2. Steatosis of the liver with indeterminate areas of hypodensity within the right hepatic lobe possibly representing post treatment change. New subtle lesions are not entirely excluded. Short-term interval follow-up and attention to these areas is recommended. MRI may also prove useful for better assessment with IV contrast. 3. Left upper pole parapelvic cyst, stable in appearance measuring approximately 2 cm in diameter. No worrisome features. 4. Small mesenteric nodules consistent with mildly enlarged mesenteric lymph nodes or peritoneal metastasis are noted in the right hemiabdomen and ventral upper abdomen. These are not definitively identified on prior exam and suspect these are new or have enlarged in the interim. 5. Mechanical bowel obstruction  or inflammation. 6. New ventral midline surgical scarring post surgical change suspected in the mesentery.   2) Mild  thrombocytopenia PLT improved from 124k today(already have held 5FU bolus dose).   PLAN:  -Did order pancreatic enzyme supplements -Recommend OTC Senna if opiate related constipation develops -Recommended that pt and wife speak with social work about disability benefits. -Discussed pt labwork today, 06/09/18; mild anemia with HGB at 12.6, all other blood counts are normal, Alk Phos improved to 151, AST and ALT have normalized, chemistries otherwise normal.  -Will collect urinalysis and urine culture  -Will send out pancreatic enzymes as well -Will start pt on 5FU/LV today and will plan to add Irinotecan in 2 weeks -Will begin pt on Fentanyl, Senna S, and Miralax  -Advised one time use of 142m magnesium citrate if absolutely needed -Recommended that the pt take Creon and Gas-X -Will see the pt back in 2 weeks   4) Iron deficiency anemia due to GI bleeding from the tumor and some blood loss with surgery. -resolved .  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 Counseling regarding advanced care planning and goals of care 05/27/2017  . Chemotherapy-induced neuropathy ([place] 11/18/2016  . Genetic testing 10/30/2016  . Port catheter in place 09/25/2016  . Family history of colon cancer 07/24/2016  . Family history of prostate cancer 07/24/2016  . Metastatic colon cancer to liver ([place] 07/01/2016  . Right Colon cancer metastasized to liver ([place] 06/17/2016  . Chronic GI bleeding 05/29/2016  . Diabetes mellitus type 2, diet-controlled ([facility] 05/22/2016  . OSA (obstructive sleep apnea) 05/22/2016  . Hyperlipidemia 05/22/2016  . Anemia due to blood loss, chronic   . Antineoplastic chemotherapy induced pancytopenia (CODE) ([place] 05/21/2016    Plan:  -Continue follow-up with primary care physician SAntony Contras MD for optimization of diabetes management - now on basal insulin with improving control.  -Continue use of CPAP for sleep apnea.   5FU/LV chemotherapy today Chemotherapy will be switch to FOLFIRI from next time in 2weeks with labs and MD visit    All of the patients questions were answered with apparent satisfaction. The patient knows to call the clinic with any problems, questions or concerns.  The total time spent in the appt was 35 minutes and more than 50% was on counseling and direct patient cares.   GSullivan LoneMD MS AAHIVMS SMunster Specialty Surgery CenterCBanner Behavioral Health HospitalHematology/Oncology Physician [facility] (Office):       [phone](Work cell):  [phone](Fax):           [phone] I, SBaldwin Jamaica am acting as a scribe for Dr. KIrene Limbo .I have reviewed the above documentation for accuracy and completeness, and I agree with the above. .Brunetta GeneraMD

## 2018-06-09 NOTE — Telephone Encounter (Signed)
Pt sched per 10/24 sch message. Pt aware of d&t.

## 2018-06-09 NOTE — Telephone Encounter (Signed)
Per 10/24 patient already scheded

## 2018-06-09 NOTE — Progress Notes (Signed)
Per Dr. Irene Limbo: patient to receive Leucovorin and 5FU only today.

## 2018-06-09 NOTE — Patient Instructions (Addendum)
Come back at 11:30am on Saturday.  [facility] Discharge Instructions for Patients Receiving Chemotherapy  Today you received the following chemotherapy agents: Leucovorin and Fluorouracil (Adrucil, 5-FU).   To help prevent nausea and vomiting after your treatment, we encourage you to take your nausea medication as prescribed. Received Aloxi during treatment today-->Take Compazine (not Zofran) for the next 3 days as needed.   If you develop nausea and vomiting that is not controlled by your nausea medication, call the clinic.   BELOW ARE SYMPTOMS THAT SHOULD BE REPORTED IMMEDIATELY:  *FEVER GREATER THAN 100.5 F  *CHILLS WITH OR WITHOUT FEVER  NAUSEA AND VOMITING THAT IS NOT CONTROLLED WITH YOUR NAUSEA MEDICATION  *UNUSUAL SHORTNESS OF BREATH  *UNUSUAL BRUISING OR BLEEDING  TENDERNESS IN MOUTH AND THROAT WITH OR WITHOUT PRESENCE OF ULCERS  *URINARY PROBLEMS  *BOWEL PROBLEMS  UNUSUAL RASH Items with * indicate a potential emergency and should be followed up as soon as possible.  Feel free to call the clinic should you have any questions or concerns. The clinic phone number is [phone].  Please show the [place] at check-in to the Emergency Department and triage nurse.

## 2018-06-10 ENCOUNTER — Ambulatory Visit: Payer: 59

## 2018-06-10 ENCOUNTER — Other Ambulatory Visit: Payer: Self-pay | Admitting: Hematology

## 2018-06-10 DIAGNOSIS — C189 Malignant neoplasm of colon, unspecified: Secondary | ICD-10-CM

## 2018-06-10 DIAGNOSIS — C787 Secondary malignant neoplasm of liver and intrahepatic bile duct: Principal | ICD-10-CM

## 2018-06-10 LAB — URINE CULTURE: Culture: <10000 — AB

## 2018-06-11 ENCOUNTER — Other Ambulatory Visit: Payer: Self-pay | Admitting: Hematology

## 2018-06-11 ENCOUNTER — Inpatient Hospital Stay: Payer: 59

## 2018-06-11 DIAGNOSIS — C189 Malignant neoplasm of colon, unspecified: Secondary | ICD-10-CM

## 2018-06-11 DIAGNOSIS — C787 Secondary malignant neoplasm of liver and intrahepatic bile duct: Principal | ICD-10-CM

## 2018-06-11 DIAGNOSIS — C182 Malignant neoplasm of ascending colon: Secondary | ICD-10-CM | POA: Diagnosis not present

## 2018-06-11 MED ORDER — HEPARIN SOD (PORK) LOCK FLUSH 100 UNIT/ML IV SOLN
500.0000 [IU] | Freq: Once | INTRAVENOUS | Status: AC | PRN
  Administered 2018-06-11: 500 [IU]
  Filled 2018-06-11: qty 5

## 2018-06-11 MED ORDER — SODIUM CHLORIDE 0.9% FLUSH
10.0000 mL | INTRAVENOUS | Status: DC | PRN
  Administered 2018-06-11: 10 mL
  Filled 2018-06-11: qty 10

## 2018-06-12 ENCOUNTER — Other Ambulatory Visit: Payer: Self-pay

## 2018-06-12 ENCOUNTER — Emergency Department (HOSPITAL_COMMUNITY): Payer: 59

## 2018-06-12 ENCOUNTER — Emergency Department (HOSPITAL_COMMUNITY)
Admission: EM | Admit: 2018-06-12 | Discharge: 2018-06-12 | Disposition: A | Discharge status: Home / Self Care | Payer: 59 | Attending: Emergency Medicine | Admitting: Emergency Medicine

## 2018-06-12 ENCOUNTER — Encounter (HOSPITAL_COMMUNITY): Payer: Self-pay

## 2018-06-12 DIAGNOSIS — R339 Retention of urine, unspecified: Secondary | ICD-10-CM | POA: Diagnosis present

## 2018-06-12 DIAGNOSIS — C785 Secondary malignant neoplasm of large intestine and rectum: Secondary | ICD-10-CM | POA: Diagnosis not present

## 2018-06-12 DIAGNOSIS — C189 Malignant neoplasm of colon, unspecified: Secondary | ICD-10-CM | POA: Insufficient documentation

## 2018-06-12 DIAGNOSIS — E119 Type 2 diabetes mellitus without complications: Secondary | ICD-10-CM | POA: Insufficient documentation

## 2018-06-12 DIAGNOSIS — D649 Anemia, unspecified: Secondary | ICD-10-CM | POA: Insufficient documentation

## 2018-06-12 DIAGNOSIS — R300 Dysuria: Secondary | ICD-10-CM | POA: Diagnosis not present

## 2018-06-12 DIAGNOSIS — R161 Splenomegaly, not elsewhere classified: Secondary | ICD-10-CM | POA: Diagnosis not present

## 2018-06-12 DIAGNOSIS — E785 Hyperlipidemia, unspecified: Secondary | ICD-10-CM | POA: Insufficient documentation

## 2018-06-12 DIAGNOSIS — Z79899 Other long term (current) drug therapy: Secondary | ICD-10-CM | POA: Insufficient documentation

## 2018-06-12 LAB — COMPREHENSIVE METABOLIC PANEL
ALBUMIN: 3.8 g/dL (ref 3.5–5.0)
ALT: 26 U/L (ref 0–44)
AST: 23 U/L (ref 15–41)
Alkaline Phosphatase: 104 U/L (ref 38–126)
Anion gap: 10 (ref 5–15)
BILIRUBIN TOTAL: 0.6 mg/dL (ref 0.3–1.2)
BUN: 18 mg/dL (ref 6–20)
CO2: 26 mmol/L (ref 22–32)
Calcium: 8.7 mg/dL — ABNORMAL LOW (ref 8.9–10.3)
Chloride: 100 mmol/L (ref 98–111)
Creatinine, Ser: 0.72 mg/dL (ref 0.61–1.24)
GFR calc Af Amer: >60 mL/min (ref >60)
Glucose, Bld: 172 mg/dL — ABNORMAL HIGH (ref 70–99)
POTASSIUM: 3.6 mmol/L (ref 3.5–5.1)
Sodium: 136 mmol/L (ref 135–145)
TOTAL PROTEIN: 6.6 g/dL (ref 6.5–8.1)

## 2018-06-12 LAB — CBC WITH DIFFERENTIAL/PLATELET
Abs Immature Granulocytes: 0.02 10*3/uL (ref 0.00–0.07)
BASOS ABS: 0 10*3/uL (ref 0.0–0.1)
Basophils Relative: 0 %
EOS ABS: 0.5 10*3/uL (ref 0.0–0.5)
Eosinophils Relative: 6 %
HEMATOCRIT: 40.7 % (ref 39.0–52.0)
Hemoglobin: 12.5 g/dL — ABNORMAL LOW (ref 13.0–17.0)
Immature Granulocytes: 0 %
Lymphocytes Relative: 13 %
Lymphs Abs: 1.1 10*3/uL (ref 0.7–4.0)
MCH: 26.3 pg (ref 26.0–34.0)
MCHC: 30.7 g/dL (ref 30.0–36.0)
MCV: 85.7 fL (ref 80.0–100.0)
Monocytes Absolute: 0.4 10*3/uL (ref 0.1–1.0)
Monocytes Relative: 5 %
NEUTROS PCT: 76 %
NRBC: 0 % (ref 0.0–0.2)
Neutro Abs: 6.5 10*3/uL (ref 1.7–7.7)
Platelets: 199 10*3/uL (ref 150–400)
RBC: 4.75 MIL/uL (ref 4.22–5.81)
RDW: 13.2 % (ref 11.5–15.5)
WBC: 8.6 10*3/uL (ref 4.0–10.5)

## 2018-06-12 LAB — URINALYSIS, ROUTINE W REFLEX MICROSCOPIC
Bilirubin Urine: NEGATIVE
Glucose, UA: NEGATIVE mg/dL
Hgb urine dipstick: NEGATIVE
KETONES UR: NEGATIVE mg/dL
LEUKOCYTES UA: NEGATIVE
NITRITE: NEGATIVE
Protein, ur: NEGATIVE mg/dL
Specific Gravity, Urine: 1.024 (ref 1.005–1.030)
pH: 6 (ref 5.0–8.0)

## 2018-06-12 LAB — LIPASE, BLOOD: LIPASE: 86 U/L — AB (ref 11–51)

## 2018-06-12 MED ORDER — SODIUM CHLORIDE 0.9 % IV BOLUS
1000.0000 mL | Freq: Once | INTRAVENOUS | Status: AC
  Administered 2018-06-12: 1000 mL via INTRAVENOUS

## 2018-06-12 MED ORDER — HYDROMORPHONE HCL 1 MG/ML IJ SOLN
1.0000 mg | Freq: Once | INTRAMUSCULAR | Status: AC
  Administered 2018-06-12: 1 mg via INTRAVENOUS
  Filled 2018-06-12: qty 1

## 2018-06-12 MED ORDER — IOPAMIDOL (ISOVUE-300) INJECTION 61%
INTRAVENOUS | Status: AC
  Filled 2018-06-12: qty 100

## 2018-06-12 MED ORDER — PHENAZOPYRIDINE HCL 200 MG PO TABS
200.0000 mg | ORAL_TABLET | Freq: Three times a day (TID) | ORAL | Status: DC
  Administered 2018-06-12: 200 mg via ORAL
  Filled 2018-06-12: qty 1

## 2018-06-12 MED ORDER — FENTANYL 12 MCG/HR TD PT72
75.0000 ug | MEDICATED_PATCH | TRANSDERMAL | Status: DC
  Administered 2018-06-12: 75 ug via TRANSDERMAL
  Filled 2018-06-12: qty 6

## 2018-06-12 MED ORDER — HEPARIN SOD (PORK) LOCK FLUSH 100 UNIT/ML IV SOLN
500.0000 [IU] | Freq: Once | INTRAVENOUS | Status: AC
  Administered 2018-06-12: 500 [IU]
  Filled 2018-06-12: qty 5

## 2018-06-12 MED ORDER — IOPAMIDOL (ISOVUE-300) INJECTION 61%
100.0000 mL | Freq: Once | INTRAVENOUS | Status: AC | PRN
  Administered 2018-06-12: 100 mL via INTRAVENOUS

## 2018-06-12 MED ORDER — SODIUM CHLORIDE 0.9 % IJ SOLN
INTRAMUSCULAR | Status: AC
  Filled 2018-06-12: qty 50

## 2018-06-12 MED ORDER — ONDANSETRON HCL 4 MG/2ML IJ SOLN
4.0000 mg | Freq: Once | INTRAMUSCULAR | Status: AC
  Administered 2018-06-12: 4 mg via INTRAVENOUS
  Filled 2018-06-12: qty 2

## 2018-06-12 MED ORDER — LIDOCAINE HCL URETHRAL/MUCOSAL 2 % EX GEL: 1.0000 "application " | Freq: Once | CUTANEOUS | Status: DC | PRN

## 2018-06-12 NOTE — ED Notes (Signed)
Patient made aware urine sample is needed. 

## 2018-06-12 NOTE — ED Triage Notes (Signed)
Patient arrived via POV. Patient is AOx4 and ambulatory. Patient is complaining of urinary retention for at least 48 hours with only being able to have several drops of urine when attempting to urinate. Patient is going through Chemo for Stage 4 Colon cancer.

## 2018-06-12 NOTE — ED Provider Notes (Signed)
[facility] DEPT Provider Note   CSN: [id number] Arrival date & time: 06/12/18  1147     History   Chief Complaint Chief Complaint  Patient presents with  . Urinary Retention  . Colon Cancer    HPI Scott Gallagher is a 47 y.o. male.   Pt presents to the ED today with sensation of urinary retention.  Pt said he feels like he is not emptying his bladder.  He feels like he is only urinating a few drops of urine.  The pt has a hx of stage IV colon cancer (originally dx in 2017) and is followed by Dr. Irene Limbo.  Last chemo (manintence 5FU/leucovorin) on 10/24.  Pt has been taking oxycodone which has not been helping with his pain.  The pt has had intermittent sweating and temps up to 99.  Urine culture done on 10/24 with <10,000 colonies insignificant growth.           Past Medical History:  Diagnosis Date  . Anemia   . Arthritis   . Colon cancer ([place]) dx'd 05/2016  . Diabetes mellitus without complication (HCC)    diet controlled  . History of blood transfusion   . Hyperlipidemia   . liver mets dx'd 05/2016  . Sleep apnea    cpap  . Wears glasses     Patient Active Problem List   Diagnosis Date Noted  . Counseling regarding advanced care planning and goals of care 05/27/2017  . 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 Genetic testing 10/30/2016  . Port catheter in place 09/25/2016  . Family history of colon cancer 07/24/2016  . Family history of prostate cancer 07/24/2016  . Metastatic colon cancer to liver ([place]) 07/01/2016  . Right Colon cancer metastasized to liver ([place]) 06/17/2016  . Chronic GI bleeding 05/29/2016  . Diabetes mellitus type 2, diet-controlled ([place]) 05/22/2016  . OSA (obstructive sleep apnea) 05/22/2016  . Hyperlipidemia 05/22/2016  . Anemia due to blood loss, chronic   . Antineoplastic chemotherapy induced pancytopenia (CODE) ([place]) 05/21/2016    Past Surgical History:  Procedure Laterality Date  .  COLON SURGERY    . IR GENERIC HISTORICAL  09/24/2016   IR CV LINE INJECTION 09/24/2016 [facility]  . IR GENERIC HISTORICAL  09/24/2016   IR US GUIDE VASC ACCESS RIGHT 09/24/2016 [facility]  . IR GENERIC HISTORICAL  09/24/2016   IR FLUORO GUIDE CV LINE RIGHT 09/24/2016 [facility]  . IR GENERIC HISTORICAL  09/30/2016   IR FLUORO GUIDE PORT INSERTION RIGHT 09/30/2016 Markus Daft, MD [facility]  . IR GENERIC HISTORICAL  09/30/2016   IR US GUIDE VASC ACCESS RIGHT 09/30/2016 Markus Daft, MD [facility]  . IR GENERIC HISTORICAL  09/30/2016   IR REMOVAL TUN ACCESS W/ PORT W/O FL MOD SED 09/30/2016 Markus Daft, MD [facility]  . LAPAROSCOPIC RIGHT HEMI COLECTOMY Right 06/17/2016   Procedure: LAPAROSCOPIC ASSISTED  RIGHT HEMI COLECTOMY;  Surgeon: [name], MD;  Location: [facility];  Service: General;  Laterality: Right;  . PORTACATH PLACEMENT Left 07/13/2016   Procedure: INSERTION PORT-A-CATH left subclavian;  Surgeon: [name], MD;  Location: [facility];  Service: General;  Laterality: Left;  . SHOULDER ACROMIOPLASTY Right 12/20/2014   Procedure: SHOULDER ACROMIOPLASTY;  Surgeon: Melrose Nakayama, MD;  Location: [facility];  Service: Orthopedics;  Laterality: Right;  . SHOULDER ARTHROSCOPY Right 12/20/2014   Procedure: RIGHT ARTHROSCOPY SHOULDER WITH DEBRIDEMENT;  Surgeon: Melrose Nakayama, MD;  Location: [facility];  Service:  Orthopedics;  Laterality: Right;  . TESTICLE SURGERY     as teen  . TONSILLECTOMY    . WISDOM TOOTH EXTRACTION          Home Medications    Prior to Admission medications   Medication Sig Start Date End Date Taking? Authorizing Provider  dexamethasone (DECADRON) 4 MG tablet TAKE 2 TABLETS BY MOUTH WITH FOOD DAILY (START THE DAY AFTER CHEMOTHERAPY FOR 2 DAYS) Patient taking differently: Take 8 mg by mouth as directed. Take 2 tabs for 2 Days After Chemo 02/18/18  Yes Brunetta Genera, MD  insulin lispro (HUMALOG) 100 UNIT/ML injection Inject into  the skin 3 (three) times daily before meals.   Yes [provider]  lidocaine-prilocaine (EMLA) cream Apply to affected area once 07/01/16  Yes Brunetta Genera, MD  lipase/protease/amylase (CREON) 12000 units CPEP capsule Take 1 capsule (12,000 Units total) by mouth 3 (three) times daily before meals. 05/19/18  Yes Brunetta Genera, MD  Melatonin 3 MG TABS Take 6 tablets by mouth at bedtime as needed (sleep).  05/14/18  Yes [provider]  oxyCODONE (OXY IR/ROXICODONE) 5 MG immediate release tablet Take 1-2 tablets (5-10 mg total) by mouth every 4 (four) hours as needed for severe pain. 06/07/18  Yes Brunetta Genera, MD  polyethylene glycol [facility]) packet Take 17 g by mouth daily. Patient taking differently: Take 17 g by mouth daily as needed for mild constipation.  06/09/18  Yes Brunetta Genera, MD  prochlorperazine (COMPAZINE) 10 MG tablet TAKE 1 TABLET BY MOUTH EVERY 6 HOURS AS NEEDED FOR NAUSEA / VOMITING Patient taking differently: Take 10 mg by mouth every 6 (six) hours as needed for nausea or vomiting.  11/06/17  Yes Brunetta Genera, MD  senna-docusate (SENNA S) 8.6-50 MG tablet Take 2 tablets by mouth at bedtime. 06/09/18  Yes Brunetta Genera, MD  fentaNYL (DURAGESIC - DOSED MCG/HR) 25 MCG/HR patch Place 1 patch (25 mcg total) onto the skin every 3 (three) days. 06/09/18   Brunetta Genera, MD  Insulin Glargine (BASAGLAR KWIKPEN) 100 UNIT/ML SOPN Inject 70 Units into the skin every morning.     [provider]  ondansetron (ZOFRAN ODT) 4 MG disintegrating tablet Take 1 tablet (4 mg total) by mouth every 8 (eight) hours as needed for nausea or vomiting. 05/08/18   Montine Circle, PA-C  ondansetron (ZOFRAN) 8 MG tablet Take 1 tablet (8 mg total) by mouth 2 (two) times daily as needed for refractory nausea / vomiting. Start on day 3 after chemotherapy. 07/01/16   Brunetta Genera, MD  traMADol (ULTRAM) 50 MG tablet TAKE 1 TABLET BY  MOUTH EVERY 6 HOURS AS NEEDED FOR MODERATE / SEVERE PAIN Patient not taking: Reported on 06/12/2018 02/28/18   Brunetta Genera, MD    Family History Family History  Problem Relation Age of Onset  . Diabetes Other   . Hyperlipidemia Other   . Hypertension Other   . Breast cancer Maternal Aunt 70  . Prostate cancer Maternal Uncle 75  . Lung cancer Paternal Uncle 37  . Stroke Maternal Grandfather 72  . Cancer Paternal Grandmother 68       dx cancer of pancreas and colon, unknown if separate primaries  . Heart Problems Paternal Grandfather        d. 54  . Prostate cancer Maternal Uncle 7  . Breast cancer Maternal Aunt 75  . Breast cancer Maternal Aunt 68  . Cervical cancer Maternal Aunt 68  .  Pancreatic cancer Maternal Aunt 54       d. 58y; heavy smoker  . Colon cancer Paternal Uncle 73       s/p partial colectomy; dx. second colon cancer at age 40-64    Social History Social History   Tobacco Use  . Smoking status: Former Smoker    Packs/day: 1.50    Years: 29.00    Pack years: 43.50    Types: Cigarettes    Last attempt to quit: 12/16/2012    Years since quitting: 5.4  . Smokeless tobacco: Never Used  . Tobacco comment: 1-2 ppd from age [age]y to 2014  Substance Use Topics  . Alcohol use: Yes    Alcohol/week: 3.0 standard drinks    Types: 3 Shots of liquor per week    Comment:  some days  . Drug use: No     Allergies   Penicillins   Review of Systems Review of Systems  Genitourinary: Positive for difficulty urinating and dysuria.  All other systems reviewed and are negative.    Physical Exam Updated Vital Signs BP 134/87   Pulse 90   Temp 98.1 F (36.7 C) (Oral)   Resp 12   Ht 6\' 2"  (1.88 m)   Wt 109.3 kg   SpO2 95%   BMI 30.94 kg/m   Physical Exam  Constitutional: He is oriented to person, place, and time. He appears well-developed and well-nourished.  HENT:  Head: Normocephalic and atraumatic.  Right Ear: External ear normal.  Left Ear:  External ear normal.  Nose: Nose normal.  Mouth/Throat: Oropharynx is clear and moist.  Eyes: Pupils are equal, round, and reactive to light. Conjunctivae and EOM are normal.  Neck: Normal range of motion. Neck supple.  Cardiovascular: Normal rate, regular rhythm, normal heart sounds and intact distal pulses.  Pulmonary/Chest: Effort normal and breath sounds normal.  Abdominal: Soft. Bowel sounds are normal. There is tenderness in the suprapubic area.  Musculoskeletal: Normal range of motion.  Neurological: He is alert and oriented to person, place, and time.  Skin: Skin is warm and dry. Capillary refill takes less than 2 seconds.  Psychiatric: He has a normal mood and affect. His behavior is normal. Judgment and thought content normal.  Nursing note and vitals reviewed.    ED Treatments / Results  Labs (all labs ordered are listed, but only abnormal results are displayed) Labs Reviewed  CBC WITH DIFFERENTIAL/PLATELET - Abnormal; Notable for the following components:      Result Value   Hemoglobin 12.5 (*)    All other components within normal limits  COMPREHENSIVE METABOLIC PANEL - Abnormal; Notable for the following components:   Glucose, Bld 172 (*)    Calcium 8.7 (*)    All other components within normal limits  LIPASE, BLOOD - Abnormal; Notable for the following components:   Lipase 86 (*)    All other components within normal limits  URINALYSIS, ROUTINE W REFLEX MICROSCOPIC    EKG None  Radiology Ct Abdomen Pelvis W Contrast  Result Date: 06/12/2018 CLINICAL DATA:  Patient arrived via POV. Patient is AOx4 and ambulatory. Patient is complaining of urinary retention for at least 48 hours with only being able to have several drops of urine when attempting to urinate. The patient is undergoing chemotherapy for stage IV colon cancer. EXAM: CT ABDOMEN AND PELVIS WITH CONTRAST TECHNIQUE: Multidetector CT imaging of the abdomen and pelvis was performed using the standard  protocol following bolus administration of intravenous contrast. CONTRAST:  149mL  ISOVUE-300 IOPAMIDOL (ISOVUE-300) INJECTION 61% COMPARISON:  Multiple prior exams, including 05/08/2018 FINDINGS: Lower chest: There is minimal bibasilar atelectasis. Heart size is normal. Hepatobiliary: Numerous new and enlarged liver lesions are identified. Mass in the posterior segment of the RIGHT hepatic lobe now measures 3.1 centimeters, previously 2.4 centimeters. Adjacent smaller nodules are also identified on the current study than on prior study, measuring 1.7 and 1.3 centimeters. New small nodule is identified in the anterior segment RIGHT hepatic lobe on image 31/2, measuring 12 millimeters. Nodule in the LOWER posterior segment RIGHT hepatic lobe is 2.0 centimeters on image 37/2, previously 9 millimeters. Nodule in the anterior aspect of the RIGHT hepatic lobe on image 40/2 is 18 millimeters, previously 15 millimeters. There is a new mass in the LEFT hepatic lobe, 15 millimeters on image 40/2. Gallbladder appears contracted and contains high attenuation material consistent with vicarious excretion of contrast. Gallbladder wall is 10 millimeters. A biliary stent is in place, unchanged in position. No evidence for intrahepatic biliary duct dilatation. There is gas in the biliary tree from stent. Small amount of perihepatic fluid appear stable. Pancreas: Unremarkable. No pancreatic ductal dilatation or surrounding inflammatory changes. Spleen: Spleen is enlarged, 16 centimeters in craniocaudal length. No focal splenic lesion. Adrenals/Urinary Tract: Adrenal glands are normal in appearance. No hydronephrosis. There are small bilateral renal cysts. The ureters are unremarkable. The bladder and visualized portion of the urethra are normal. Stomach/Bowel: The stomach and small bowel loops are normal in appearance. Colonic anastomosis is identified in the [place], uncomplicated in appearance. No evidence for  obstructing colonic lesion. The rectum is normal in appearance. Within the mesentery, there are numerous small nodules, increased in size and number since the previous exam. Small amounts of fluid are also identified throughout the mesentery, associated somewhat nodular appearance consistent with mesenteric studding of tumor. Focal irregular collections of fluid are identified in the LEFT paracolic gutter and pelvis. The pelvis this collection measures 4.8 x 2.2 centimeters. Vascular/Lymphatic: There is atherosclerotic calcification of the abdominal aorta. Reproductive: Prostate is unremarkable. Other: Postoperative changes in the anterior abdominal wall. Musculoskeletal: No acute or significant osseous findings. IMPRESSION: 1. Increased size and number of liver metastases. 2. Gallbladder is contracted and has a thickened wall, suspicious for acute cholecystitis. Unchanged appearance of biliary stent. 3. Recommend further evaluation with RIGHT UPPER QUADRANT ultrasound evaluated gallbladder. 4. Splenomegaly. 5. Increased mesenteric nodules, consistent with metastatic disease. 6. Small, irregular collections of mesenteric and pelvic fluid, suspicious for metastatic disease. 7.  Aortic atherosclerosis.  (ICD10-I70.0) Electronically Signed   By: Nolon Nations M.D.   On: 06/12/2018 15:44    Procedures Procedures (including critical care time)  Medications Ordered in ED Medications  lidocaine (XYLOCAINE) 2 % jelly 1 application (has no administration in time range)  phenazopyridine (PYRIDIUM) tablet 200 mg (200 mg Oral Given 06/12/18 1257)  iopamidol (ISOVUE-300) 61 % injection (has no administration in time range)  sodium chloride 0.9 % injection (has no administration in time range)  HYDROmorphone (DILAUDID) injection 1 mg (has no administration in time range)  fentaNYL (DURAGESIC - dosed mcg/hr) 75 mcg (has no administration in time range)  sodium chloride 0.9 % bolus 1,000 mL (0 mLs Intravenous  Stopped 06/12/18 1528)  HYDROmorphone (DILAUDID) injection 1 mg (1 mg Intravenous Given 06/12/18 1306)  ondansetron (ZOFRAN) injection 4 mg (4 mg Intravenous Given 06/12/18 1306)  iopamidol (ISOVUE-300) 61 % injection 100 mL (100 mLs Intravenous Contrast Given 06/12/18 1501)     Initial Impression /  Assessment and Plan / ED Course  I have reviewed the triage vital signs and the nursing notes.  Pertinent labs & imaging results that were available during my care of the patient were reviewed by me and considered in my medical decision making (see chart for details).    No definite etiology for dysuria found.  Possibly, it is from the mesenteric lesions irritating the bladder.  The pt is told to f/u with urology for possible urodynamic testing.  However, after fluids, pt urinating normally here.  The pt is told about the worsening of mets.  He is instructed to f/u with Dr. Irene Limbo.  Pt is trying to get his insurance to pay for fentanyl patches, in the meantime, we will start him on 1 patch prior to d/c for his pain.    Pt knows to return if worse.  Final Clinical Impressions(s) / ED Diagnoses   Final diagnoses:  Dysuria  Colon cancer metastasized to multiple sites [facility]    ED Discharge Orders    None       Isla Pence, MD 06/12/18 1610

## 2018-06-12 NOTE — ED Notes (Signed)
Patient transported to CT 

## 2018-06-12 NOTE — ED Notes (Signed)
ED Provider at bedside. 

## 2018-06-14 ENCOUNTER — Other Ambulatory Visit: Payer: Self-pay | Admitting: Hematology

## 2018-06-15 ENCOUNTER — Other Ambulatory Visit: Payer: Self-pay | Admitting: Hematology

## 2018-06-15 MED ORDER — OXYCODONE HCL 5 MG PO TABS: 5.0000 mg | ORAL_TABLET | ORAL | 0 refills | Status: DC | PRN

## 2018-06-21 ENCOUNTER — Telehealth: Payer: Self-pay | Admitting: Hematology

## 2018-06-21 NOTE — Telephone Encounter (Signed)
Kale out 11/7 f/u moved to [facility] - ok per [facility]. Date/time remain the same. Left message for patient.

## 2018-06-22 ENCOUNTER — Other Ambulatory Visit: Payer: Self-pay | Admitting: Hematology

## 2018-06-22 DIAGNOSIS — C787 Secondary malignant neoplasm of liver and intrahepatic bile duct: Principal | ICD-10-CM

## 2018-06-22 DIAGNOSIS — C189 Malignant neoplasm of colon, unspecified: Secondary | ICD-10-CM

## 2018-06-22 DIAGNOSIS — Z7189 Other specified counseling: Secondary | ICD-10-CM

## 2018-06-22 MED ORDER — LOPERAMIDE HCL 2 MG PO TABS: 2.0000 mg | ORAL_TABLET | Freq: Four times a day (QID) | ORAL | 1 refills | Status: DC | PRN

## 2018-06-22 MED ORDER — ONDANSETRON HCL 8 MG PO TABS: 8.0000 mg | ORAL_TABLET | Freq: Two times a day (BID) | ORAL | 1 refills | Status: DC | PRN

## 2018-06-22 NOTE — Progress Notes (Signed)
DISCONTINUE ON PATHWAY REGIMEN - Colorectal     A cycle is every 14 days:     Oxaliplatin        Dose Mod: None     Leucovorin        Dose Mod: None     5-Fluorouracil        Dose Mod: None     5-Fluorouracil        Dose Mod: None  **Always confirm dose/schedule in your pharmacy ordering system**  REASON: Disease Progression PRIOR TREATMENT: [id number]: Neoadjuvant mFOLFOX6 (Number of Cycles to be Determined by Surgeon) TREATMENT RESPONSE: Progressive Disease (PD)  START ON PATHWAY REGIMEN - Colorectal     A cycle is every 14 days:     Irinotecan      Leucovorin      5-Fluorouracil      5-Fluorouracil   **Always confirm dose/schedule in your pharmacy ordering system**  Patient Characteristics: Distant Metastases, Second Line, KRAS Mutation Positive/Unknown, BRAF Wild-Type/Unknown, Bevacizumab Ineligible Therapeutic Status: Distant Metastases BRAF Mutation Status: Awaiting Test Results KRAS/NRAS Mutation Status: Mutation Positive Line of Therapy: Second Line  Intent of Therapy: Non-Curative / Palliative Intent, Discussed with Patient

## 2018-06-23 ENCOUNTER — Inpatient Hospital Stay: Payer: 59

## 2018-06-23 ENCOUNTER — Inpatient Hospital Stay: Payer: 59 | Attending: Hematology

## 2018-06-23 ENCOUNTER — Telehealth: Payer: Self-pay | Admitting: *Deleted

## 2018-06-23 ENCOUNTER — Other Ambulatory Visit: Payer: Self-pay | Admitting: Hematology

## 2018-06-23 ENCOUNTER — Telehealth: Payer: Self-pay | Admitting: Hematology

## 2018-06-23 ENCOUNTER — Inpatient Hospital Stay (HOSPITAL_BASED_OUTPATIENT_CLINIC_OR_DEPARTMENT_OTHER): Payer: 59 | Admitting: Oncology

## 2018-06-23 VITALS — BP 117/85 | HR 86 | Temp 98.6°F | Resp 18 | Ht 74.0 in | Wt 237.7 lb

## 2018-06-23 DIAGNOSIS — C189 Malignant neoplasm of colon, unspecified: Secondary | ICD-10-CM

## 2018-06-23 DIAGNOSIS — Z87891 Personal history of nicotine dependence: Secondary | ICD-10-CM | POA: Diagnosis not present

## 2018-06-23 DIAGNOSIS — Z79899 Other long term (current) drug therapy: Secondary | ICD-10-CM

## 2018-06-23 DIAGNOSIS — C786 Secondary malignant neoplasm of retroperitoneum and peritoneum: Secondary | ICD-10-CM

## 2018-06-23 DIAGNOSIS — Z794 Long term (current) use of insulin: Secondary | ICD-10-CM | POA: Diagnosis not present

## 2018-06-23 DIAGNOSIS — I2510 Atherosclerotic heart disease of native coronary artery without angina pectoris: Secondary | ICD-10-CM | POA: Insufficient documentation

## 2018-06-23 DIAGNOSIS — R61 Generalized hyperhidrosis: Secondary | ICD-10-CM | POA: Insufficient documentation

## 2018-06-23 DIAGNOSIS — Z7189 Other specified counseling: Secondary | ICD-10-CM

## 2018-06-23 DIAGNOSIS — R109 Unspecified abdominal pain: Secondary | ICD-10-CM

## 2018-06-23 DIAGNOSIS — D500 Iron deficiency anemia secondary to blood loss (chronic): Secondary | ICD-10-CM

## 2018-06-23 DIAGNOSIS — C182 Malignant neoplasm of ascending colon: Secondary | ICD-10-CM

## 2018-06-23 DIAGNOSIS — M545 Low back pain: Secondary | ICD-10-CM | POA: Diagnosis not present

## 2018-06-23 DIAGNOSIS — Z8042 Family history of malignant neoplasm of prostate: Secondary | ICD-10-CM | POA: Insufficient documentation

## 2018-06-23 DIAGNOSIS — C787 Secondary malignant neoplasm of liver and intrahepatic bile duct: Secondary | ICD-10-CM | POA: Insufficient documentation

## 2018-06-23 DIAGNOSIS — R162 Hepatomegaly with splenomegaly, not elsewhere classified: Secondary | ICD-10-CM | POA: Diagnosis not present

## 2018-06-23 DIAGNOSIS — E785 Hyperlipidemia, unspecified: Secondary | ICD-10-CM | POA: Diagnosis not present

## 2018-06-23 DIAGNOSIS — G893 Neoplasm related pain (acute) (chronic): Secondary | ICD-10-CM

## 2018-06-23 DIAGNOSIS — M6281 Muscle weakness (generalized): Secondary | ICD-10-CM | POA: Insufficient documentation

## 2018-06-23 DIAGNOSIS — Z5111 Encounter for antineoplastic chemotherapy: Secondary | ICD-10-CM | POA: Insufficient documentation

## 2018-06-23 DIAGNOSIS — I700 Atherosclerosis of aorta: Secondary | ICD-10-CM | POA: Diagnosis not present

## 2018-06-23 DIAGNOSIS — R112 Nausea with vomiting, unspecified: Secondary | ICD-10-CM | POA: Insufficient documentation

## 2018-06-23 DIAGNOSIS — E119 Type 2 diabetes mellitus without complications: Secondary | ICD-10-CM | POA: Insufficient documentation

## 2018-06-23 DIAGNOSIS — Z803 Family history of malignant neoplasm of breast: Secondary | ICD-10-CM | POA: Insufficient documentation

## 2018-06-23 DIAGNOSIS — Z9049 Acquired absence of other specified parts of digestive tract: Secondary | ICD-10-CM | POA: Insufficient documentation

## 2018-06-23 DIAGNOSIS — Z800 Family history of malignant neoplasm of digestive organs: Secondary | ICD-10-CM | POA: Insufficient documentation

## 2018-06-23 DIAGNOSIS — T451X5A Adverse effect of antineoplastic and immunosuppressive drugs, initial encounter: Secondary | ICD-10-CM | POA: Diagnosis not present

## 2018-06-23 DIAGNOSIS — M1990 Unspecified osteoarthritis, unspecified site: Secondary | ICD-10-CM | POA: Insufficient documentation

## 2018-06-23 DIAGNOSIS — R509 Fever, unspecified: Secondary | ICD-10-CM | POA: Insufficient documentation

## 2018-06-23 DIAGNOSIS — Z95828 Presence of other vascular implants and grafts: Secondary | ICD-10-CM

## 2018-06-23 LAB — CBC WITH DIFFERENTIAL/PLATELET
ABS IMMATURE GRANULOCYTES: 0.01 10*3/uL (ref 0.00–0.07)
Basophils Absolute: 0 10*3/uL (ref 0.0–0.1)
Basophils Relative: 1 %
EOS PCT: 5 %
Eosinophils Absolute: 0.3 10*3/uL (ref 0.0–0.5)
HCT: 36.9 % — ABNORMAL LOW (ref 39.0–52.0)
HEMOGLOBIN: 11.4 g/dL — AB (ref 13.0–17.0)
Immature Granulocytes: 0 %
LYMPHS PCT: 14 %
Lymphs Abs: 0.8 10*3/uL (ref 0.7–4.0)
MCH: 26.3 pg (ref 26.0–34.0)
MCHC: 30.9 g/dL (ref 30.0–36.0)
MCV: 85 fL (ref 80.0–100.0)
MONO ABS: 0.5 10*3/uL (ref 0.1–1.0)
MONOS PCT: 9 %
Neutro Abs: 4 10*3/uL (ref 1.7–7.7)
Neutrophils Relative %: 71 %
Platelets: 176 10*3/uL (ref 150–400)
RBC: 4.34 MIL/uL (ref 4.22–5.81)
RDW: 13.3 % (ref 11.5–15.5)
WBC: 5.6 10*3/uL (ref 4.0–10.5)
nRBC: 0 % (ref 0.0–0.2)

## 2018-06-23 LAB — CMP (CANCER CENTER ONLY)
ALBUMIN: 3.8 g/dL (ref 3.5–5.0)
ALT: 17 U/L (ref 0–44)
AST: 19 U/L (ref 15–41)
Alkaline Phosphatase: 121 U/L (ref 38–126)
Anion gap: 9 (ref 5–15)
BILIRUBIN TOTAL: 0.5 mg/dL (ref 0.3–1.2)
BUN: 9 mg/dL (ref 6–20)
CHLORIDE: 104 mmol/L (ref 98–111)
CO2: 26 mmol/L (ref 22–32)
CREATININE: 0.86 mg/dL (ref 0.61–1.24)
Calcium: 9.1 mg/dL (ref 8.9–10.3)
GFR, Est AFR Am: >60 mL/min (ref >60)
GFR, Estimated: >60 mL/min (ref >60)
GLUCOSE: 167 mg/dL — AB (ref 70–99)
POTASSIUM: 4.2 mmol/L (ref 3.5–5.1)
Sodium: 139 mmol/L (ref 135–145)
Total Protein: 6.8 g/dL (ref 6.5–8.1)

## 2018-06-23 LAB — CEA (IN HOUSE-CHCC): CEA (CHCC-IN HOUSE): 133.91 ng/mL — AB (ref 0.00–5.00)

## 2018-06-23 MED ORDER — SODIUM CHLORIDE 0.9 % IV SOLN
2425.0000 mg/m2 | INTRAVENOUS | Status: DC
  Administered 2018-06-23: 5800 mg via INTRAVENOUS
  Filled 2018-06-23: qty 116

## 2018-06-23 MED ORDER — PALONOSETRON HCL INJECTION 0.25 MG/5ML
INTRAVENOUS | Status: AC
  Filled 2018-06-23: qty 5

## 2018-06-23 MED ORDER — SODIUM CHLORIDE 0.9 % IV SOLN: 10.0000 mg | Freq: Once | INTRAVENOUS | Status: DC

## 2018-06-23 MED ORDER — PALONOSETRON HCL INJECTION 0.25 MG/5ML
0.2500 mg | Freq: Once | INTRAVENOUS | Status: AC
  Administered 2018-06-23: 0.25 mg via INTRAVENOUS

## 2018-06-23 MED ORDER — FLUOROURACIL CHEMO INJECTION 2.5 GM/50ML
400.0000 mg/m2 | Freq: Once | INTRAVENOUS | Status: AC
  Administered 2018-06-23: 950 mg via INTRAVENOUS
  Filled 2018-06-23: qty 19

## 2018-06-23 MED ORDER — LOPERAMIDE HCL 2 MG PO CAPS
ORAL_CAPSULE | ORAL | Status: AC
  Filled 2018-06-23: qty 1

## 2018-06-23 MED ORDER — DEXAMETHASONE SODIUM PHOSPHATE 10 MG/ML IJ SOLN
INTRAMUSCULAR | Status: AC
  Filled 2018-06-23: qty 1

## 2018-06-23 MED ORDER — SODIUM CHLORIDE 0.9 % IV SOLN
Freq: Once | INTRAVENOUS | Status: AC
  Administered 2018-06-23: 12:00:00 via INTRAVENOUS
  Filled 2018-06-23: qty 250

## 2018-06-23 MED ORDER — SODIUM CHLORIDE 0.9% FLUSH
10.0000 mL | INTRAVENOUS | Status: DC | PRN
  Administered 2018-06-23: 10 mL via INTRAVENOUS
  Filled 2018-06-23: qty 10

## 2018-06-23 MED ORDER — ATROPINE SULFATE 1 MG/ML IJ SOLN
0.5000 mg | Freq: Once | INTRAMUSCULAR | Status: AC | PRN
  Administered 2018-06-23: 0.5 mg via INTRAVENOUS

## 2018-06-23 MED ORDER — ATROPINE SULFATE 1 MG/ML IJ SOLN
INTRAMUSCULAR | Status: AC
  Filled 2018-06-23: qty 1

## 2018-06-23 MED ORDER — SODIUM CHLORIDE 0.9% FLUSH
10.0000 mL | INTRAVENOUS | Status: DC | PRN
  Filled 2018-06-23: qty 10

## 2018-06-23 MED ORDER — SODIUM CHLORIDE 0.9 % IV SOLN
400.0000 mg/m2 | Freq: Once | INTRAVENOUS | Status: AC
  Administered 2018-06-23: 956 mg via INTRAVENOUS
  Filled 2018-06-23: qty 47.8

## 2018-06-23 MED ORDER — HEPARIN SOD (PORK) LOCK FLUSH 100 UNIT/ML IV SOLN
500.0000 [IU] | Freq: Once | INTRAVENOUS | Status: DC | PRN
  Filled 2018-06-23: qty 5

## 2018-06-23 MED ORDER — LOPERAMIDE HCL 2 MG PO TABS
2.0000 mg | ORAL_TABLET | Freq: Once | ORAL | Status: AC
  Administered 2018-06-23: 2 mg via ORAL
  Filled 2018-06-23: qty 1

## 2018-06-23 MED ORDER — SODIUM CHLORIDE 0.9 % IV SOLN
180.0000 mg/m2 | Freq: Once | INTRAVENOUS | Status: AC
  Administered 2018-06-23: 440 mg via INTRAVENOUS
  Filled 2018-06-23: qty 15

## 2018-06-23 MED ORDER — SODIUM CHLORIDE 0.9 % IV SOLN
Freq: Once | INTRAVENOUS | Status: AC
  Administered 2018-06-23: 10:00:00 via INTRAVENOUS
  Filled 2018-06-23: qty 250

## 2018-06-23 MED ORDER — DEXAMETHASONE SODIUM PHOSPHATE 10 MG/ML IJ SOLN
10.0000 mg | Freq: Once | INTRAMUSCULAR | Status: AC
  Administered 2018-06-23: 10 mg via INTRAVENOUS

## 2018-06-23 NOTE — Telephone Encounter (Signed)
Received fax from [place]: Fentanyl 41mcg//hr (10 month) approved until 06/23/2020. Notified Mr. Spradley of same.

## 2018-06-23 NOTE — Progress Notes (Signed)
[facility] OFFICE PROGRESS NOTE   Diagnosis: Colon cancer  INTERVAL HISTORY:   Mr. Nieto is followed by Dr. Irene Limbo with a history of metastatic colon cancer.  He is currently being treated with maintenance 5-FU/leucovorin.  He was last treated on 06/09/2018.  He was seen in the emergency room with dysuria on 06/12/2018.  A CT of the abdomen and pelvis on 06/12/2018 was compared to a CT from 05/08/2018.  New and enlarged liver lesions were noted.  Increased size and number of mesenteric nodules.  Focal irregular fluid collections in the paracolic gutter and pelvis.  Gallbladder  was noted to be contracted and thick-walled.  He complains of abdomen and low back pain.  The pain is partially controlled with oxycodone.  He was prescribed a Duragesic patch but has not started this yet.  He is waiting on insurance approval.  He is scheduled to begin FOLFIRI today.  He reports no significant diarrhea, nausea, or mouth sores with chemotherapy to date.  Objective:  Vital signs in last 24 hours:  Blood pressure 117/85, pulse 86, temperature 98.6 F (37 C), temperature source Oral, resp. rate 18, height 6\' 2"  (1.88 m), weight 237 lb 11.2 oz (107.8 kg), SpO2 98 %.    HEENT: No thrush or ulcers Resp: Lungs clear bilaterally Cardio: Regular rate and rhythm GI: No hepatomegaly, mildly distended, tender in the low abdomen, no palpable mass Vascular: No leg edema  Skin: Palms without erythema  Portacath/PICC-without erythema  Lab Results:  Lab Results  Component Value Date   WBC 5.6 06/23/2018   HGB 11.4 (L) 06/23/2018   HCT 36.9 (L) 06/23/2018   MCV 85.0 06/23/2018   PLT 176 06/23/2018   NEUTROABS 4.0 06/23/2018    CMP  Lab Results  Component Value Date   NA 139 06/23/2018   K 4.2 06/23/2018   CL 104 06/23/2018   CO2 26 06/23/2018   GLUCOSE 167 (H) 06/23/2018   BUN 9 06/23/2018   CREATININE 0.86 06/23/2018   CALCIUM 9.1 06/23/2018   PROT 6.8 06/23/2018   ALBUMIN 3.8 06/23/2018   AST 19 06/23/2018   ALT 17 06/23/2018   ALKPHOS 121 06/23/2018   BILITOT 0.5 06/23/2018   GFRNONAA >60 06/23/2018   GFRAA >60 06/23/2018    Lab Results  Component Value Date   CEA1 133.91 (H) 06/23/2018     Medications: I have reviewed the patient's current medications.   Assessment/Plan: 1.  Stage IV colon cancer-clinical and x-ray evidence of disease progression while on maintenance 5-FU/leucovorin 2.  Pain secondary to #1 3.  Diabetes  Disposition: Mr. Weisgerber has metastatic colon cancer.  He has developed disease progression following FOLFOX/Avastin,Y-90, and maintenance 5-FU/leucovorin.  The plan is to begin salvage chemotherapy with FOLFIRI today.  I reviewed potential toxicities associated with the FOLFIRI regimen including the chance for nausea/vomiting, mucositis, diarrhea, alopecia, and hematologic toxicity.  He agrees to proceed.  He did not develop significant nausea following FOLFOX.  He will discontinue home Decadron prophylaxis as this caused hyperglycemia.  He will return for an office visit and cycle 2 FOLFIRI in 2 weeks.    He will continue to code own as needed for pain.  He will begin a Duragesic patch today.  He will let us know if this does not provide adequate pain relief.  25 minutes were spent with the patient today.  The majority of the time was used for counseling and coordination of care.  Betsy Coder, MD  06/23/2018  10:57 AM

## 2018-06-23 NOTE — Patient Instructions (Signed)
[facility] Discharge Instructions for Patients Receiving Chemotherapy  Today you received the following chemotherapy agents Irinotecan, Leucovorin, 5FU.  To help prevent nausea and vomiting after your treatment, we encourage you to take your nausea medication.   If you develop nausea and vomiting that is not controlled by your nausea medication, call the clinic.   BELOW ARE SYMPTOMS THAT SHOULD BE REPORTED IMMEDIATELY:  *FEVER GREATER THAN 100.5 F  *CHILLS WITH OR WITHOUT FEVER  NAUSEA AND VOMITING THAT IS NOT CONTROLLED WITH YOUR NAUSEA MEDICATION  *UNUSUAL SHORTNESS OF BREATH  *UNUSUAL BRUISING OR BLEEDING  TENDERNESS IN MOUTH AND THROAT WITH OR WITHOUT PRESENCE OF ULCERS  *URINARY PROBLEMS  *BOWEL PROBLEMS  UNUSUAL RASH Items with * indicate a potential emergency and should be followed up as soon as possible.  Feel free to call the clinic should you have any questions or concerns. The clinic phone number is [phone].  Please show the [place] at check-in to the Emergency Department and triage nurse.  Irinotecan injection What is this medicine? IRINOTECAN (ir in oh TEE kan ) is a chemotherapy drug. It is used to treat colon and rectal cancer. This medicine may be used for other purposes; ask your health care provider or pharmacist if you have questions. COMMON BRAND NAME(S): Camptosar What should I tell my health care provider before I take this medicine? They need to know if you have any of these conditions: -blood disorders -dehydration -diarrhea -infection (especially a virus infection such as chickenpox, cold sores, or herpes) -liver disease -low blood counts, like low white cell, platelet, or red cell counts -recent or ongoing radiation therapy -an unusual or allergic reaction to irinotecan, sorbitol, other chemotherapy, other medicines, foods, dyes, or preservatives -pregnant or trying to get pregnant -breast-feeding How should I  use this medicine? This drug is given as an infusion into a vein. It is administered in a hospital or clinic by a specially trained health care professional. Talk to your pediatrician regarding the use of this medicine in children. Special care may be needed. Overdosage: If you think you have taken too much of this medicine contact a poison control center or emergency room at once. NOTE: This medicine is only for you. Do not share this medicine with others. What if I miss a dose? It is important not to miss your dose. Call your doctor or health care professional if you are unable to keep an appointment. What may interact with this medicine? Do not take this medicine with any of the following medications: -atazanavir -certain medicines for fungal infections like itraconazole and ketoconazole -St. John's Wort This medicine may also interact with the following medications: -dexamethasone -diuretics -laxatives -medicines for seizures like carbamazepine, mephobarbital, phenobarbital, phenytoin, primidone -medicines to increase blood counts like filgrastim, pegfilgrastim, sargramostim -prochlorperazine -vaccines This list may not describe all possible interactions. Give your health care provider a list of all the medicines, herbs, non-prescription drugs, or dietary supplements you use. Also tell them if you smoke, drink alcohol, or use illegal drugs. Some items may interact with your medicine. What should I watch for while using this medicine? Your condition will be monitored carefully while you are receiving this medicine. You will need important blood work done while you are taking this medicine. This drug may make you feel generally unwell. This is not uncommon, as chemotherapy can affect healthy cells as well as cancer cells. Report any side effects. Continue your course of treatment even though you feel  ill unless your doctor tells you to stop. In some cases, you may be given additional  medicines to help with side effects. Follow all directions for their use. You may get drowsy or dizzy. Do not drive, use machinery, or do anything that needs mental alertness until you know how this medicine affects you. Do not stand or sit up quickly, especially if you are an older patient. This reduces the risk of dizzy or fainting spells. Call your doctor or health care professional for advice if you get a fever, chills or sore throat, or other symptoms of a cold or flu. Do not treat yourself. This drug decreases your body's ability to fight infections. Try to avoid being around people who are sick. This medicine may increase your risk to bruise or bleed. Call your doctor or health care professional if you notice any unusual bleeding. Be careful brushing and flossing your teeth or using a toothpick because you may get an infection or bleed more easily. If you have any dental work done, tell your dentist you are receiving this medicine. Avoid taking products that contain aspirin, acetaminophen, ibuprofen, naproxen, or ketoprofen unless instructed by your doctor. These medicines may hide a fever. Do not become pregnant while taking this medicine. Women should inform their doctor if they wish to become pregnant or think they might be pregnant. There is a potential for serious side effects to an unborn child. Talk to your health care professional or pharmacist for more information. Do not breast-feed an infant while taking this medicine. What side effects may I notice from receiving this medicine? Side effects that you should report to your doctor or health care professional as soon as possible: -allergic reactions like skin rash, itching or hives, swelling of the face, lips, or tongue -low blood counts - this medicine may decrease the number of white blood cells, red blood cells and platelets. You may be at increased risk for infections and bleeding. -signs of infection - fever or chills, cough, sore  throat, pain or difficulty passing urine -signs of decreased platelets or bleeding - bruising, pinpoint red spots on the skin, black, tarry stools, blood in the urine -signs of decreased red blood cells - unusually weak or tired, fainting spells, lightheadedness -breathing problems -chest pain -diarrhea -feeling faint or lightheaded, falls -flushing, runny nose, sweating during infusion -mouth sores or pain -pain, swelling, redness or irritation where injected -pain, swelling, warmth in the leg -pain, tingling, numbness in the hands or feet -problems with balance, talking, walking -stomach cramps, pain -trouble passing urine or change in the amount of urine -vomiting as to be unable to hold down drinks or food -yellowing of the eyes or skin Side effects that usually do not require medical attention (report to your doctor or health care professional if they continue or are bothersome): -constipation -hair loss -headache -loss of appetite -nausea, vomiting -stomach upset This list may not describe all possible side effects. Call your doctor for medical advice about side effects. You may report side effects to FDA at 1-800-FDA-1088. Where should I keep my medicine? This drug is given in a hospital or clinic and will not be stored at home. NOTE: This sheet is a summary. It may not cover all possible information. If you have questions about this medicine, talk to your doctor, pharmacist, or health care provider.  2018 Elsevier/Gold Standard (2013-01-30 16:29:32)  Leucovorin injection What is this medicine? LEUCOVORIN (loo koe VOR in) is used to prevent or treat the harmful  effects of some medicines. This medicine is used to treat anemia caused by a low amount of folic acid in the body. It is also used with 5-fluorouracil (5-FU) to treat colon cancer. This medicine may be used for other purposes; ask your health care provider or pharmacist if you have questions. What should I tell my  health care provider before I take this medicine? They need to know if you have any of these conditions: -anemia from low levels of vitamin B-12 in the blood -an unusual or allergic reaction to leucovorin, folic acid, other medicines, foods, dyes, or preservatives -pregnant or trying to get pregnant -breast-feeding How should I use this medicine? This medicine is for injection into a muscle or into a vein. It is given by a health care professional in a hospital or clinic setting. Talk to your pediatrician regarding the use of this medicine in children. Special care may be needed. Overdosage: If you think you have taken too much of this medicine contact a poison control center or emergency room at once. NOTE: This medicine is only for you. Do not share this medicine with others. What if I miss a dose? This does not apply. What may interact with this medicine? -capecitabine -fluorouracil -phenobarbital -phenytoin -primidone -trimethoprim-sulfamethoxazole This list may not describe all possible interactions. Give your health care provider a list of all the medicines, herbs, non-prescription drugs, or dietary supplements you use. Also tell them if you smoke, drink alcohol, or use illegal drugs. Some items may interact with your medicine. What should I watch for while using this medicine? Your condition will be monitored carefully while you are receiving this medicine. This medicine may increase the side effects of 5-fluorouracil, 5-FU. Tell your doctor or health care professional if you have diarrhea or mouth sores that do not get better or that get worse. What side effects may I notice from receiving this medicine? Side effects that you should report to your doctor or health care professional as soon as possible: -allergic reactions like skin rash, itching or hives, swelling of the face, lips, or tongue -breathing problems -fever, infection -mouth sores -unusual bleeding or  bruising -unusually weak or tired Side effects that usually do not require medical attention (report to your doctor or health care professional if they continue or are bothersome): -constipation or diarrhea -loss of appetite -nausea, vomiting This list may not describe all possible side effects. Call your doctor for medical advice about side effects. You may report side effects to FDA at 1-800-FDA-1088. Where should I keep my medicine? This drug is given in a hospital or clinic and will not be stored at home. NOTE: This sheet is a summary. It may not cover all possible information. If you have questions about this medicine, talk to your doctor, pharmacist, or health care provider.  2018 Elsevier/Gold Standard (2008-02-07 16:50:29)  Fluorouracil, 5FU; Diclofenac topical cream What is this medicine? FLUOROURACIL; DICLOFENAC (flure oh YOOR a sil; dye KLOE fen ak) is a combination of a topical chemotherapy agent and non-steroidal anti-inflammatory drug (NSAID). It is used on the skin to treat skin cancer and skin conditions that could become cancer. This medicine may be used for other purposes; ask your health care provider or pharmacist if you have questions. COMMON BRAND NAME(S): FLUORAC What should I tell my health care provider before I take this medicine? They need to know if you have any of these conditions: -bleeding problems -cigarette smoker -DPD enzyme deficiency -heart disease -high blood pressure -if you  frequently drink alcohol containing drinks -kidney disease -liver disease -open or infected skin -stomach problems -swelling or open sores at the treatment site -recent or planned coronary artery bypass graft (CABG) surgery -an unusual or allergic reaction to fluorouracil, diclofenac, aspirin, other NSAIDs, other medicines, foods, dyes, or preservatives -pregnant or trying to get pregnant -breast-feeding How should I use this medicine? This medicine is only for use on the  skin. Follow the directions on the prescription label. Wash hands before and after use. Wash affected area and gently pat dry. To apply this medicine use a cotton-tipped applicator, or use gloves if applying with fingertips. If applied with unprotected fingertips, it is very important to wash your hands well after you apply this medicine. Avoid applying to the eyes, nose, or mouth. Apply enough medicine to cover the affected area. You can cover the area with a light gauze dressing, but do not use tight or air-tight dressings. Finish the full course prescribed by your doctor or health care professional, even if you think your condition is better. Do not stop taking except on the advice of your doctor or health care professional. Talk to your pediatrician regarding the use of this medicine in children. Special care may be needed. Overdosage: If you think you have taken too much of this medicine contact a poison control center or emergency room at once. NOTE: This medicine is only for you. Do not share this medicine with others. What if I miss a dose? If you miss a dose, apply it as soon as you can. If it is almost time for your next dose, only use that dose. Do not apply extra doses. Contact your doctor or health care professional if you miss more than one dose. What may interact with this medicine? Interactions are not expected. Do not use any other skin products without telling your doctor or health care professional. This list may not describe all possible interactions. Give your health care provider a list of all the medicines, herbs, non-prescription drugs, or dietary supplements you use. Also tell them if you smoke, drink alcohol, or use illegal drugs. Some items may interact with your medicine. What should I watch for while using this medicine? Visit your doctor or health care professional for checks on your progress. You will need to use this medicine for 2 to 6 weeks. This may be longer depending on  the condition being treated. You may not see full healing for another 1 to 2 months after you stop using the medicine. Treated areas of skin can look unsightly during and for several weeks after treatment with this medicine. This medicine can make you more sensitive to the sun. Keep out of the sun. If you cannot avoid being in the sun, wear protective clothing and use sunscreen. Do not use sun lamps or tanning beds/booths. If a pet comes in contact with the area where this medicine was applied to your skin or if it is ingested, they may have a serious risk of side effects. If accidental contact happens, the skin of the pet should be washed right away with soap and water. Contact your vet right away if your pet becomes exposed. Do not become pregnant while taking this medicine. Women should inform their doctor if they wish to become pregnant or think they might be pregnant. There is a potential for serious side effects to an unborn child. Talk to your health care professional or pharmacist for more information. What side effects may I notice from receiving  this medicine? Side effects that you should report to your doctor or health care professional as soon as possible: -allergic reactions like skin rash, itching or hives, swelling of the face, lips, or tongue -black or bloody stools, blood in the urine or vomit -blurred vision -chest pain -difficulty breathing or wheezing -redness, blistering, peeling or loosening of the skin, including inside the mouth -severe redness and swelling of normal skin -slurred speech or weakness on one side of the body -trouble passing urine or change in the amount of urine -unexplained weight gain or swelling -unusually weak or tired -yellowing of eyes or skin Side effects that usually do not require medical attention (report to your doctor or health care professional if they continue or are bothersome): -increased sensitivity of the skin to sun and ultraviolet  light -pain and burning of the affected area -scaling or swelling of the affected area -skin rash, itching of the affected area -tenderness This list may not describe all possible side effects. Call your doctor for medical advice about side effects. You may report side effects to FDA at 1-800-FDA-1088. Where should I keep my medicine? Keep out of the reach of children and pets. Store at room temperature between 20 and 25 degrees C (68 and 77 degrees F). Throw away any unused medicine after the expiration date. NOTE: This sheet is a summary. It may not cover all possible information. If you have questions about this medicine, talk to your doctor, pharmacist, or health care provider.  2018 Elsevier/Gold Standard (2015-09-13 17:35:08)

## 2018-06-23 NOTE — Telephone Encounter (Signed)
Per Dr. Gearldine Shown request.Contacted [facility] for PA for Fentanyl patch. Prescriber line [phone]. [facility]. Pt demographics and ICD 10 code for diagnosis given. Request to be expedited - accepted per rep. Results to be faxed to MD office within 24-48 hours. Case # [id number]

## 2018-06-23 NOTE — Telephone Encounter (Addendum)
Called [facility] per Dr. Gearldine Shown request. Spoke with Ronnell [place]. Gave patient's name, dob and contact numbers. They will follow up with patient.

## 2018-06-23 NOTE — Telephone Encounter (Signed)
Appts scheduled avs/calendar declined due to my chart per 11/7 los

## 2018-06-23 NOTE — Patient Instructions (Signed)
Implanted Port Home Guide An implanted port is a type of central line that is placed under the skin. Central lines are used to provide IV access when treatment or nutrition needs to be given through a person's veins. Implanted ports are used for long-term IV access. An implanted port may be placed because:  You need IV medicine that would be irritating to the small veins in your hands or arms.  You need long-term IV medicines, such as antibiotics.  You need IV nutrition for a long period.  You need frequent blood draws for lab tests.  You need dialysis.  Implanted ports are usually placed in the chest area, but they can also be placed in the upper arm, the abdomen, or the leg. An implanted port has two main parts:  Reservoir. The reservoir is round and will appear as a small, raised area under your skin. The reservoir is the part where a needle is inserted to give medicines or draw blood.  Catheter. The catheter is a thin, flexible tube that extends from the reservoir. The catheter is placed into a large vein. Medicine that is inserted into the reservoir goes into the catheter and then into the vein.  How will I care for my incision site? Do not get the incision site wet. Bathe or shower as directed by your health care provider. How is my port accessed? Special steps must be taken to access the port:  Before the port is accessed, a numbing cream can be placed on the skin. This helps numb the skin over the port site.  Your health care provider uses a sterile technique to access the port. ? Your health care provider must put on a mask and sterile gloves. ? The skin over your port is cleaned carefully with an antiseptic and allowed to dry. ? The port is gently pinched between sterile gloves, and a needle is inserted into the port.  Only "non-coring" port needles should be used to access the port. Once the port is accessed, a blood return should be checked. This helps ensure that the port  is in the vein and is not clogged.  If your port needs to remain accessed for a constant infusion, a clear (transparent) bandage will be placed over the needle site. The bandage and needle will need to be changed every week, or as directed by your health care provider.  Keep the bandage covering the needle clean and dry. Do not get it wet. Follow your health care provider's instructions on how to take a shower or bath while the port is accessed.  If your port does not need to stay accessed, no bandage is needed over the port.  What is flushing? Flushing helps keep the port from getting clogged. Follow your health care provider's instructions on how and when to flush the port. Ports are usually flushed with saline solution or a medicine called heparin. The need for flushing will depend on how the port is used.  If the port is used for intermittent medicines or blood draws, the port will need to be flushed: ? After medicines have been given. ? After blood has been drawn. ? As part of routine maintenance.  If a constant infusion is running, the port may not need to be flushed.  How long will my port stay implanted? The port can stay in for as long as your health care provider thinks it is needed. When it is time for the port to come out, surgery will be   done to remove it. The procedure is similar to the one performed when the port was put in. When should I seek immediate medical care? When you have an implanted port, you should seek immediate medical care if:  You notice a bad smell coming from the incision site.  You have swelling, redness, or drainage at the incision site.  You have more swelling or pain at the port site or the surrounding area.  You have a fever that is not controlled with medicine.  This information is not intended to replace advice given to you by your health care provider. Make sure you discuss any questions you have with your health care provider. Document  Released: 08/03/2005 Document Revised: 01/09/2016 Document Reviewed: 04/10/2013 Elsevier Interactive Patient Education  2017 [employer].  

## 2018-06-24 ENCOUNTER — Other Ambulatory Visit: Payer: Self-pay | Admitting: *Deleted

## 2018-06-24 MED ORDER — OXYCODONE HCL 5 MG PO TABS: 5.0000 mg | ORAL_TABLET | ORAL | 0 refills | Status: DC | PRN

## 2018-06-25 ENCOUNTER — Inpatient Hospital Stay: Payer: 59

## 2018-06-25 VITALS — BP 125/76 | HR 91 | Temp 98.8°F | Resp 18

## 2018-06-25 DIAGNOSIS — Z7189 Other specified counseling: Secondary | ICD-10-CM

## 2018-06-25 DIAGNOSIS — C182 Malignant neoplasm of ascending colon: Secondary | ICD-10-CM | POA: Diagnosis not present

## 2018-06-25 DIAGNOSIS — C189 Malignant neoplasm of colon, unspecified: Secondary | ICD-10-CM

## 2018-06-25 DIAGNOSIS — C787 Secondary malignant neoplasm of liver and intrahepatic bile duct: Secondary | ICD-10-CM

## 2018-06-25 MED ORDER — SODIUM CHLORIDE 0.9% FLUSH
10.0000 mL | INTRAVENOUS | Status: DC | PRN
  Administered 2018-06-25: 10 mL
  Filled 2018-06-25: qty 10

## 2018-06-25 MED ORDER — HEPARIN SOD (PORK) LOCK FLUSH 100 UNIT/ML IV SOLN
500.0000 [IU] | Freq: Once | INTRAVENOUS | Status: AC | PRN
  Administered 2018-06-25: 500 [IU]
  Filled 2018-06-25: qty 5

## 2018-06-29 DIAGNOSIS — G5621 Lesion of ulnar nerve, right upper limb: Secondary | ICD-10-CM | POA: Diagnosis not present

## 2018-07-01 DIAGNOSIS — M9903 Segmental and somatic dysfunction of lumbar region: Secondary | ICD-10-CM | POA: Diagnosis not present

## 2018-07-01 DIAGNOSIS — M9905 Segmental and somatic dysfunction of pelvic region: Secondary | ICD-10-CM | POA: Diagnosis not present

## 2018-07-01 DIAGNOSIS — M9902 Segmental and somatic dysfunction of thoracic region: Secondary | ICD-10-CM | POA: Diagnosis not present

## 2018-07-06 DIAGNOSIS — M9902 Segmental and somatic dysfunction of thoracic region: Secondary | ICD-10-CM | POA: Diagnosis not present

## 2018-07-06 DIAGNOSIS — M9905 Segmental and somatic dysfunction of pelvic region: Secondary | ICD-10-CM | POA: Diagnosis not present

## 2018-07-06 DIAGNOSIS — M9903 Segmental and somatic dysfunction of lumbar region: Secondary | ICD-10-CM | POA: Diagnosis not present

## 2018-07-06 NOTE — Progress Notes (Signed)
[name]    HEMATOLOGY/ONCOLOGY CLINIC NOTE  Date of Service: 07/07/18    Patient Care Team: [name], MD as PCP - General (Family Medicine) [name] M.D. 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 KRAS mutated BRAF, NRAS mutation neg MSI Stable.  PET/CT scan done on 06/25/2016 Show multiple foci of hypermetabolic hepatic metastases (atleast 4-5) and 2 hypermetabolic peritoneal nodules along the dorsal peritoneal surface concerning for peritoneal metastases.  MRI Liver 07/27/2016 - Multiple small liver metastases throughout the right and left hepatic lobes, largest measuring 2.1 cm. 1.2 cm enhancing peritoneal nodule in left paracolic gutter, suspicious for peritoneal metastasis. Other small peritoneal nodules better visualized on recent PET-CT which showed hypermetabolic activity, also suspicious for peritoneal metastases.   CT CHEST WO CONTRAST 10/12/2016  [facility] Result Impression   1. Congenital variant of bilateral middle lobes. 2. Groundglass opacification with regions of bronchiectasis in the right lower lobe adjacent to the fissure, consistent with inflammatory/infectious changes. 3. No definitive evidence of metastatic disease to the chest. 4. Probable sebaceous cyst in the subcutaneous tissues beneath the left anterior chest.   MR ABDOMEN AND PELVIS RDEYCXKG FOLLOW UP2/26/2018 [facility] Result Impression    Decreased size of multiple hepatic lesions and two peritoneal nodules compared to outside MRI 07/27/2016. No new lesions identified in  the abdomen or pelvis.     PREVIOUS TREATMENT  FOLFOX x 10 cycles. 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 (without 5FU bolus - due to thrombocytopenia). S/p Y-90 treatment to 1 hepatic lobe.  Plan for prn hepatic Y90 radio-embolization   INTERVAL HISTORY:     Scott Gallagher is here for follow-up for his metastatic colon cancer and for C2 FOLFIRI. The patient's last visit with [place] was on 06/09/18 and he did see my colleague Dr. Benay Spice in the interim. He is accompanied today by his wife.  The pt reports that he is going to [facility] next week for an ERCP.   The pt reports that he vomited for 3 consecutive mornings, resolving about 3 days ago, and was taking Dexamethasone. Denies eating anything abnormal. He denies any diarrhea, but felt some constipation. He notes that his nausea dissipated on its own. His constipation also resolved without using laxatives. He has been staying well hydrated.  The pt denies any mouth sores. He has been eating better over the last 3 days. He notes that his appetite has been overall decreased since his surgery. He denies worsened abdominal pain after eating. He endorses pain on the sides of his abdomen and back, and lower abdomen, denying pain in the central abdomen. His urine flows easily and is not yellow. The pt notes that his pain has not changed character.   The pt notes that he was able to pick up his 2mg/24hr fentanyl patch and began using it about a week ago, and has used about 8-10 Oxycodone a day with marginal relief.   The pt also notes that each night he has had low  grade fevers, up to 100.0.   The pt notes that he has not historically "been a cryer" but has found himself crying "out of the blue." He notes he would like to speak with a counselor about his mental health.   He also notes that his right  hand continues to be weak, and his right elbow nerve continues to be tender. This began after his surgery and he will be meeting with orthopedics soon.   Of note since the patient's last visit, pt has had a CT A/P completed on 06/12/18 with results revealing Increased size and number of liver metastases. 2. Gallbladder is contracted and has a thickened wall, suspicious for acute cholecystitis. Unchanged appearance of biliary stent. 3. Recommend further evaluation with RIGHT UPPER QUADRANT ultrasound evaluated gallbladder. 4. Splenomegaly. 5. Increased mesenteric nodules, consistent with metastatic disease. 6. Small, irregular collections of mesenteric and pelvic fluid, suspicious for metastatic disease. 7.  Aortic atherosclerosis.  Lab results today (07/07/18) of CBC w/diff, CMP, and Reticulocytes is as follows: all values are WNL except for HGB at 11.4, HCT at 36.4, Glucose at 128.  On review of systems, pt reports recent vomiting and nausea, some fatigue, recent constipation, outer and lower abdominal pain, back pain, right hand weakness, low-grade fevers, and denies diarrhea, worsened abdominal pain after eating, central abdominal pain, fevers over 100.4, leg swelling, and any other symptoms.   MEDICAL HISTORY:  Past Medical History:  Diagnosis Date  . Anemia   . Arthritis   . Colon cancer ([place]) dx'd 05/2016  . Diabetes mellitus without complication (HCC)    diet controlled  . History of blood transfusion   . Hyperlipidemia   . liver mets dx'd 05/2016  . Sleep apnea    cpap  . Wears glasses     SURGICAL HISTORY: Past Surgical History:  Procedure Laterality Date  . COLON SURGERY    . IR GENERIC HISTORICAL  09/24/2016   IR CV LINE INJECTION 09/24/2016 [facility]  . IR GENERIC HISTORICAL  09/24/2016   IR US GUIDE VASC ACCESS RIGHT 09/24/2016 [facility]  . IR GENERIC HISTORICAL  09/24/2016   IR FLUORO GUIDE CV LINE RIGHT 09/24/2016 [facility]  . IR GENERIC HISTORICAL  09/30/2016   IR  FLUORO GUIDE PORT INSERTION RIGHT 09/30/2016 Markus Daft, MD [facility]  . IR GENERIC HISTORICAL  09/30/2016   IR US GUIDE VASC ACCESS RIGHT 09/30/2016 Markus Daft, MD [facility]  . IR GENERIC HISTORICAL  09/30/2016   IR REMOVAL TUN ACCESS W/ PORT W/O FL MOD SED 09/30/2016 Markus Daft, MD [facility]  . LAPAROSCOPIC RIGHT HEMI COLECTOMY Right 06/17/2016   Procedure: LAPAROSCOPIC ASSISTED  RIGHT HEMI COLECTOMY;  Surgeon: [name], MD;  Location: [facility];  Service: General;  Laterality: Right;  . PORTACATH PLACEMENT Left 07/13/2016   Procedure: INSERTION PORT-A-CATH left subclavian;  Surgeon: [name], MD;  Location: [facility];  Service: General;  Laterality: Left;  . SHOULDER ACROMIOPLASTY Right 12/20/2014   Procedure: SHOULDER ACROMIOPLASTY;  Surgeon: Melrose Nakayama, MD;  Location: [place];  Service: Orthopedics;  Laterality: Right;  . SHOULDER ARTHROSCOPY Right 12/20/2014   Procedure: RIGHT ARTHROSCOPY SHOULDER WITH DEBRIDEMENT;  Surgeon: Melrose Nakayama, MD;  Location: [place];  Service: Orthopedics;  Laterality: Right;  . TESTICLE SURGERY     as teen  . TONSILLECTOMY    . WISDOM TOOTH EXTRACTION      SOCIAL HISTORY: Social History   Socioeconomic History  . Marital status: Married  Spouse name: Not on file  . Number of children: Not on file  . Years of education: Not on file  . Highest education level: Not on file  Occupational History  . Occupation: cemetery maintenance  Social Needs  . Financial resource strain: Not on file  . Food insecurity:    Worry: Not on file    Inability: Not on file  . Transportation needs:    Medical: Not on file    Non-medical: Not on file  Tobacco Use  . Smoking status: Former Smoker    Packs/day: 1.50    Years: 29.00    Pack years: 43.50    Types: Cigarettes    Last attempt to quit: 12/16/2012    Years since quitting: 5.5  . Smokeless tobacco: Never Used  . Tobacco comment: 1-2 ppd from age [age]y to  2014  Substance and Sexual Activity  . Alcohol use: Yes    Alcohol/week: 3.0 standard drinks    Types: 3 Shots of liquor per week    Comment:  some days  . Drug use: No  . Sexual activity: Not Currently    Birth control/protection: None  Lifestyle  . Physical activity:    Days per week: Not on file    Minutes per session: Not on file  . Stress: Not on file  Relationships  . Social connections:    Talks on phone: Not on file    Gets together: Not on file    Attends religious service: Not on file    Active member of club or organization: Not on file    Attends meetings of clubs or organizations: Not on file    Relationship status: Not on file  . Intimate partner violence:    Fear of current or ex partner: Not on file    Emotionally abused: Not on file    Physically abused: Not on file    Forced sexual activity: Not on file  Other Topics Concern  . Not on file  Social History Narrative  . Not on file    FAMILY HISTORY: Family History  Problem Relation Age of Onset  . Diabetes Other   . Hyperlipidemia Other   . Hypertension Other   . Breast cancer Maternal Aunt 70  . Prostate cancer Maternal Uncle 75  . Lung cancer Paternal Uncle 77  . Stroke Maternal Grandfather 72  . Cancer Paternal Grandmother 99       dx cancer of pancreas and colon, unknown if separate primaries  . Heart Problems Paternal Grandfather        d. 73  . Prostate cancer Maternal Uncle 38  . Breast cancer Maternal Aunt 75  . Breast cancer Maternal Aunt 68  . Cervical cancer Maternal Aunt 68  . Pancreatic cancer Maternal Aunt 54       d. 58y; heavy smoker  . Colon cancer Paternal Uncle 34       s/p partial colectomy; dx. second colon cancer at age 53-64    ALLERGIES:  is allergic to penicillins.  MEDICATIONS:  Current Outpatient Medications  Medication Sig Dispense Refill  . CREON 12000 units CPEP capsule TAKE ONE CAPSULE BY MOUTH THREE TIMES DAILY BEFORE MEALS 90 capsule 0  . dexamethasone  (DECADRON) 4 MG tablet TAKE 2 TABLETS BY MOUTH WITH FOOD DAILY (START THE DAY AFTER CHEMOTHERAPY FOR 2 DAYS) (Patient taking differently: Take 8 mg by mouth as directed. Take 2 tabs for 2 Days After Chemo) 30 tablet 0  . fentaNYL (  DURAGESIC - DOSED MCG/HR) 50 MCG/HR Place 1 patch (50 mcg total) onto the skin every 3 (three) days. 10 patch 0  . Insulin Glargine (BASAGLAR KWIKPEN) 100 UNIT/ML SOPN Inject 70 Units into the skin every morning.     . insulin lispro (HUMALOG) 100 UNIT/ML injection Inject into the skin 3 (three) times daily before meals.    [name] loperamide (IMODIUM A-D) 2 MG tablet Take 1-2 tablets (2-4 mg total) by mouth 4 (four) times daily as needed for diarrhea or loose stools. 100 tablet 1  . LORazepam (ATIVAN) 0.5 MG tablet Take 1-2 tablets (0.5-1 mg total) by mouth every 8 (eight) hours as needed for anxiety (nausea). 30 tablet 0  . Melatonin 3 MG TABS Take 6 tablets by mouth at bedtime as needed (sleep).   0  . ondansetron (ZOFRAN ODT) 4 MG disintegrating tablet Take 1 tablet (4 mg total) by mouth every 8 (eight) hours as needed for nausea or vomiting. 10 tablet 0  . ondansetron (ZOFRAN) 8 MG tablet Take 1 tablet (8 mg total) by mouth 2 (two) times daily as needed for refractory nausea / vomiting. Start on day 3 after chemotherapy. 30 tablet 1  . oxyCODONE (OXY IR/ROXICODONE) 5 MG immediate release tablet Take 1-2 tablets (5-10 mg total) by mouth every 4 (four) hours as needed for severe pain. 90 tablet 0  . polyethylene glycol (MIRALAX) packet Take 17 g by mouth daily. (Patient taking differently: Take 17 g by mouth daily as needed for mild constipation. ) 30 each 1  . prochlorperazine (COMPAZINE) 10 MG tablet TAKE 1 TABLET BY MOUTH EVERY 6 HOURS AS NEEDED FOR NAUSEA / VOMITING 30 tablet 1  . senna-docusate (SENNA S) 8.6-50 MG tablet Take 2 tablets by mouth at bedtime. 60 tablet 1   No current facility-administered medications for this visit.    Facility-Administered Medications  Ordered in Other Visits  Medication Dose Route Frequency Provider Last Rate Last Dose  . 0.9 %  sodium chloride infusion   Intravenous Once Brunetta Genera, MD      . 0.9 %  sodium chloride infusion   Intravenous Once Brunetta Genera, MD      . atropine injection 0.5 mg  0.5 mg Intravenous Once PRN Brunetta Genera, MD      . fluorouracil (ADRUCIL) 5,750 mg in sodium chloride 0.9 % 135 mL chemo infusion  2,400 mg/m2 (Treatment Plan Recorded) Intravenous 1 day or 1 dose Brunetta Genera, MD      . fluorouracil (ADRUCIL) chemo injection 950 mg  400 mg/m2 (Treatment Plan Recorded) Intravenous Once Brunetta Genera, MD      . fosaprepitant (EMEND) 150 mg, dexamethasone (DECADRON) 10 mg in sodium chloride 0.9 % 145 mL IVPB   Intravenous Once Brunetta Genera, MD      . heparin lock flush 100 unit/mL  500 Units Intracatheter Once PRN Brunetta Genera, MD      . irinotecan (CAMPTOSAR) 440 mg in dextrose 5 % 500 mL chemo infusion  180 mg/m2 (Treatment Plan Recorded) Intravenous Once Brunetta Genera, MD      . leucovorin 956 mg in dextrose 5 % 250 mL infusion  400 mg/m2 (Treatment Plan Recorded) Intravenous Once Brunetta Genera, MD      . loperamide (IMODIUM A-D) tablet 2 mg  2 mg Oral Once Brunetta Genera, MD      . palonosetron (ALOXI) injection 0.25 mg  0.25 mg Intravenous Once Brunetta Genera, MD      .  sodium chloride flush (NS) 0.9 % injection 10 mL  10 mL Intracatheter PRN Brunetta Genera, MD        REVIEW OF SYSTEMS:    A 10+ POINT REVIEW OF SYSTEMS WAS OBTAINED including neurology, dermatology, psychiatry, cardiac, respiratory, lymph, extremities, GI, GU, Musculoskeletal, constitutional, breasts, reproductive, HEENT.  All pertinent positives are noted in the HPI.  All others are negative.   PHYSICAL EXAMINATION:  ECOG PERFORMANCE STATUS: 1 - Symptomatic but completely ambulatory  GENERAL:alert, in no acute distress and comfortable SKIN:  no acute rashes, no significant lesions EYES: conjunctiva are pink and non-injected, sclera anicteric OROPHARYNX: MMM, no exudates, no oropharyngeal erythema or ulceration NECK: supple, no JVD LYMPH:  no palpable lymphadenopathy in the cervical, axillary or inguinal regions LUNGS: clear to auscultation b/l with normal respiratory effort HEART: regular rate & rhythm ABDOMEN:  normoactive bowel sounds , some mild tenderness to palpation in left central abdomen, not distended. No palpable hepatosplenomegaly. Healed surgical incision.  Extremity: no pedal edema PSYCH: alert & oriented x 3 with fluent speech NEURO: no focal motor/sensory deficits   LABORATORY DATA:  I have reviewed the data as listed  .[name] CBC Latest Ref Rng & Units 07/07/2018 06/23/2018 06/12/2018  WBC 4.0 - 10.5 K/uL 6.1 5.6 8.6  Hemoglobin 13.0 - 17.0 g/dL 11.4(L) 11.4(L) 12.5(L)  Hematocrit 39.0 - 52.0 % 36.4(L) 36.9(L) 40.7  Platelets 150 - 400 K/uL 195 176 199   . CMP Latest Ref Rng & Units 07/07/2018 06/23/2018 06/12/2018  Glucose 70 - 99 mg/dL 128(H) 167(H) 172(H)  BUN 6 - 20 mg/dL _0 Creatinine 0.61 - 1.24 mg/dL 0.81 0.86 0.72  Sodium 135 - 145 mmol/L 140 139 136  Potassium 3.5 - 5.1 mmol/L 4.1 4.2 3.6  Chloride 98 - 111 mmol/L 105 104 100  CO2 22 - 32 mmol/L _1 Calcium 8.9 - 10.3 mg/dL 9.1 9.1 8.7(L)  Total Protein 6.5 - 8.1 g/dL 6.6 6.8 6.6  Total Bilirubin 0.3 - 1.2 mg/dL 0.8 0.5 0.6  Alkaline Phos 38 - 126 U/L 117 121 104  AST 15 - 41 U/L _2 ALT 0 - 44 U/L _3 04/14/18 Surgical Pathology:   .    Microscopic Comment 2. COLON AND RECTUM (INCLUDING TRANS-ANAL RESECTION): Specimen: Terminal ileum, right colon and appendix Procedure: Segmental resection Tumor site: Proximal ascending colon Specimen integrity: Intact Macroscopic intactness of mesorectum: Not applicable: x Complete: NA Near complete: NA Incomplete: NA Cannot be determined (specify): NA Macroscopic tumor  perforation: The mass invades through muscularis propria into pericolonic soft tissue Invasive tumor: Maximum size: 5.5 cm Histologic type(s): Adenocarcinoma Histologic grade and differentiation: G3 G1: well differentiated/low grade 1 of 4 Supplemental copy SUPPLEMENTAL for Mccollam, Naseem ([id number]) Microscopic Comment(continued) G2: moderately differentiated/low grade G3: poorly differentiated/high grade G4: undifferentiated/high grade Type of polyp in which invasive carcinoma arose: Tubular adenoma Microscopic extension of invasive tumor: The mass invade through the muscularis propria into pericolonic soft tissue Lymph-Vascular invasion: Identified Peri-neural invasion: Identified Tumor deposit(s) (discontinuous extramural extension): Present Resection margins: Proximal margin: Negative Distal margin: Negative Circumferential (radial) (posterior ascending, posterior descending; lateral and posterior mid-rectum; and entire lower 1/3 rectum):Negative Mesenteric margin (sigmoid and transverse): NA Distance closest margin (if all above margins negative): 3.7 cm from the non peritonealized pericolic soft tissue margin Trans-anal resection margins only: Deep margin: NA Mucosal Margin: NA Distance closest mucosal margin (if negative): NA Treatment effect (neo-adjuvant therapy): NA Additional polyp(s): Negative  Non-neoplastic findings: Unremarkable Lymph nodes: number examined 18; number positive: 5 Pathologic Staging: pT3, N2a, M1a Ancillary studies: MSI ordered      RADIOGRAPHIC STUDIES: I have personally reviewed the radiological images as listed and agreed with the findings in the report. CT abd/pelvis 12/09/2017: IMPRESSION: 1. Heterogeneous hepatic steatosis. Given this mild limitation, no evidence of progressive hepatic metastasis. Similar nonspecific capsular irregularity along the inferior right hepatic lobe. 2. Suspect developing omental/peritoneal metastasis, as  evidenced by new and increased omental nodularity. Trace cul-de-sac fluid is nonspecific but could also be secondary. 3.  No acute process or evidence of metastatic disease in the chest. 4. Age advanced coronary artery atherosclerosis. Recommend assessment of coronary risk factors and consideration of medical therapy. 5.  Aortic Atherosclerosis (ICD10-I70.0). 6. Possible developing cirrhosis.  Correlate with risk factors.   Electronically Signed   By: Abigail Miyamoto M.D.   On: 12/09/2017 14:23    MR LIVER WWO CONTRAST2/14/2019 [facility] Result Impression   1.Decreased sizes of multiple T2 hyperintense metastatic lesions (in the setting of patient's known colon adenocarcinoma with mucinous features) in both hepatic lobes (the majority of which are tiny). 2.No new lesions. 3.Posttreatment changes within the right hepatic lobe. 4.Other findings, as detailed above.  Result Narrative  MR LIVER WITH AND WITHOUT CONTRAST, 09/30/2017 10:20 AM  INDICATION: liver malignancy \ liver malignancy \ C18.9 Metastatic colon cancer to liver (HCC) \ C78.7 Metastatic colon cancer to liver ([place])  ADDITIONAL HISTORY: None. COMPARISON: MRI abdomen dated 07/01/2017.  TECHNIQUE: Multiplanar, multisequence MR images of the upper abdomen were obtained before and after intravenous administration of gadolinium-based contrast.   FINDINGS:  LOWER CHEST Heart: Normal size. No pericardial effusion. Lungs/pleura: No masses, consolidations, or effusions.  ABDOMEN Liver: Changes of prior radioembolization in the right hepatic lobe. Perfusional anomaly in the posterior right hepatic lobe, likely a venovenous shunt. Decreased sizes of multiple T2 hyperintense metastatic lesions throughout both hepatic lobes (the majority of which are tiny and are marked in PACS on series 5), with the dominant lesion measuring 10 mm in segment 8 (series 5, image 19), versus 12 mm previously. No new  lesions. Gallbladder: Normal. No stones or inflammatory changes. Biliary: No obstruction. Spleen: Splenomegaly, with the spleen measuring 15.2 cm in span. Pancreas: Normal. Adrenals: Normal. Kidneys: No masses, stones, or obstruction. Unchanged fluid signal lesions in both kidneys, measuring up to 2.0 cm in the interpolar region of the left kidney. Stomach/bowel: Unremarkable.    ASSESSMENT & PLAN:   47 y.o. 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 KRAS mutated BRAF, NRAS mutation neg MSI Stable.  PET/CT scan done on 06/25/2016 Show multiple foci of hypermetabolic hepatic metastases (atleast 4-5) and 2 hypermetabolic peritoneal nodules along the dorsal peritoneal surface concerning for peritoneal metastases.  MRI Liver 07/27/2016 - Multiple small liver metastases throughout the right and left hepatic lobes, largest measuring 2.1 cm. 1.2 cm enhancing peritoneal nodule in left paracolic gutter, suspicious for peritoneal metastasis. Other small peritoneal nodules better visualized on recent PET-CT which showed hypermetabolic activity, also suspicious for peritoneal metastases.   MRI Abd 10/12/2016 at [facility] --shows improvement in all the liver and the 2 peritoneal lesions and no new lesions.  MRI liver and CT C/A/P on 01/01/2017 as noted above showed improved/stable disease.   MRI Liver 07/01/2017 at [facility] -  1.Three T2 hyperintense  metastatic lesions are seen in the liver, with the largest again in segment 8. These appear unchanged from prior, again without appreciable enhancement or restricted diffusion. Several of the previously noted tiny lesions are no longer visualized. No new lesions identified.  2.Treatment related changes in the posterior right hepatic lobe. 3.A peritoneal nodule adjacent to the splenic flexure is  now more conspicuous compared to the prior study but appears similar to more remote imaging and again could relate to neoplastic disease, although is indeterminate.  CT Abd/pelvis 08/23/2017: Interval resolution of previously seen small right hepatic lobe lesion. Stable tiny sub-cm nodule along the capsular surface of the right hepatic lobe. No new or progressive disease identified. Stable hepatic steatosis with areas of fatty sparing.  MRI 09/30/17:  Decreased sizes of multiple T2 hyperintense metastatic lesions (in the setting of patient's known colon adenocarcinoma with mucinous features) in both hepatic lobes (the majority of which are tiny). No new lesions. Posttreatment changes within the right hepatic lobe. Other findings, as detailed above.   12/09/17 CT C/A/P with pt which revealed Suspected developing omental/peritoneal metastasis, as evidenced by new and increased omental nodularity.  -CEA levels are slowing increasing and on 4/419 were increased to 11.87 and today are up to 27.26  02/10/18 MRI Liver Post treatment changes in the posterior right lobe. No significant change in the peripherally enhancing cystic lesion in segment 6 and another subcentimeter punctate T2 hyperintensity is identified in segment 7. The remaining previously reported lesions are not seen on today's exam. Continued attention on follow-up imaging is advised. 2.Interval development of multiple new peritoneal implants in the right anterior abdomen as detailed above and interval increase in size of the implant adjacent to the splenic flexure  02/21/18 CT C/A/P revealed Multiple scattered peritoneal implants in the upper to mid abdomen are similar compared to 02/10/2018.2.Additional tiny soft tissue implants in the lower abdomen and pelvis are technically new compared to 01/01/2017. Advise continued attention on follow-up.   05/08/18 CT A/P revealed Coronary arteriosclerosis. 2. Steatosis of the liver with indeterminate areas  of hypodensity within the right hepatic lobe possibly representing post treatment change. New subtle lesions are not entirely excluded. Short-term interval follow-up and attention to these areas is recommended. MRI may also prove useful for better assessment with IV contrast. 3. Left upper pole parapelvic cyst, stable in appearance measuring approximately 2 cm in diameter. No worrisome features. 4. Small mesenteric nodules consistent with mildly enlarged mesenteric lymph nodes or peritoneal metastasis are noted in the right hemiabdomen and ventral upper abdomen. These are not definitively identified on prior exam and suspect these are new or have enlarged in the interim. 5. Mechanical bowel obstruction or inflammation. 6. New ventral midline surgical scarring post surgical change suspected in the mesentery.   2) Mild thrombocytopenia PLT improved from 124k today(already have held 5FU bolus dose).   PLAN:   -Discussed pt labwork today, 07/07/18; blood counts and chemistries are stable  -Discussed the 06/12/18 CT A/P which revealed Increased size and number of liver metastases. 2. Gallbladder is contracted and has a thickened wall, suspicious for acute cholecystitis. Unchanged appearance of biliary stent. 3. Recommend further evaluation with RIGHT UPPER QUADRANT ultrasound evaluated gallbladder. 4. Splenomegaly. 5. Increased mesenteric nodules, consistent with metastatic disease. 6. Small, irregular collections of mesenteric and pelvic fluid, suspicious for metastatic disease. 7.  Aortic atherosclerosis. -Will increase Fentanyl patch to 39mg/24hr -Encouraged pt to begin using Senna S and Miralax for constipation relief, and to expect to use this  with increased pain medication  -Continue Zofran and Compazine for nausea control -Restart Ativan for additional nausea relief -Will on board the GI Nurse Navigator -Will refer the pt to palliative care -Will refer the pt to behavioral health as requested -The  pt has no prohibitive toxicities from continuing FOLFIRI at this time.   -Recommended that pt and wife speak with social work about disability benefits.  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 Counseling regarding advanced care planning and goals of care 05/27/2017  . Chemotherapy-induced neuropathy ([place]) 11/18/2016  . Genetic testing 10/30/2016  . Port catheter in place 09/25/2016  . Family history of colon cancer 07/24/2016  . Family history of prostate cancer 07/24/2016  . Metastatic colon cancer to liver ([place]) 07/01/2016  . Right Colon cancer metastasized to liver ([place]) 06/17/2016  . Chronic GI bleeding 05/29/2016  . Diabetes mellitus type 2, diet-controlled ([place]) 05/22/2016  . OSA (obstructive sleep apnea) 05/22/2016  . Hyperlipidemia 05/22/2016  . Anemia due to blood loss, chronic   . Antineoplastic chemotherapy induced pancytopenia (CODE) ([place]) 05/21/2016   Plan:  -Continue follow-up with primary care physician [name], MD for optimization of diabetes management - now on basal insulin with improving control.  -Continue use of CPAP for sleep apnea.   -please schedule next 2 cycles of FOLFIRI with labs and MD visit as ordered -behavioral health referral for anxiety -home palliative care referral    All of the patients questions were answered with apparent satisfaction. The patient knows to call the clinic with any problems, questions or concerns.  The total time spent in the appt was 30 minutes and more than 50% was on counseling and direct patient cares.   Sullivan Lone MD [place] AAHIVMS [facility] Hematology/Oncology Physician [facility]  (Office):       [phone] (Work cell):  [phone] (Fax):           [phone]  I,  Baldwin Jamaica, am acting as a scribe for Dr. Sullivan Lone.   .I have reviewed the above documentation for accuracy and completeness, and I agree with the above. Brunetta Genera MD

## 2018-07-07 ENCOUNTER — Telehealth: Payer: Self-pay | Admitting: Hematology

## 2018-07-07 ENCOUNTER — Telehealth: Payer: Self-pay | Admitting: *Deleted

## 2018-07-07 ENCOUNTER — Inpatient Hospital Stay (HOSPITAL_BASED_OUTPATIENT_CLINIC_OR_DEPARTMENT_OTHER): Payer: 59 | Admitting: Hematology

## 2018-07-07 ENCOUNTER — Inpatient Hospital Stay: Payer: 59

## 2018-07-07 ENCOUNTER — Inpatient Hospital Stay (HOSPITAL_BASED_OUTPATIENT_CLINIC_OR_DEPARTMENT_OTHER): Payer: 59 | Admitting: Medical

## 2018-07-07 VITALS — BP 114/76 | HR 86 | Temp 98.5°F | Resp 18 | Ht 74.0 in | Wt 232.3 lb

## 2018-07-07 VITALS — BP 120/72 | HR 74 | Temp 98.5°F | Resp 17

## 2018-07-07 DIAGNOSIS — D500 Iron deficiency anemia secondary to blood loss (chronic): Secondary | ICD-10-CM

## 2018-07-07 DIAGNOSIS — Z803 Family history of malignant neoplasm of breast: Secondary | ICD-10-CM

## 2018-07-07 DIAGNOSIS — R61 Generalized hyperhidrosis: Secondary | ICD-10-CM

## 2018-07-07 DIAGNOSIS — C787 Secondary malignant neoplasm of liver and intrahepatic bile duct: Secondary | ICD-10-CM

## 2018-07-07 DIAGNOSIS — Z7189 Other specified counseling: Secondary | ICD-10-CM

## 2018-07-07 DIAGNOSIS — Z87891 Personal history of nicotine dependence: Secondary | ICD-10-CM

## 2018-07-07 DIAGNOSIS — C189 Malignant neoplasm of colon, unspecified: Secondary | ICD-10-CM

## 2018-07-07 DIAGNOSIS — Z794 Long term (current) use of insulin: Secondary | ICD-10-CM

## 2018-07-07 DIAGNOSIS — R509 Fever, unspecified: Secondary | ICD-10-CM

## 2018-07-07 DIAGNOSIS — Z9049 Acquired absence of other specified parts of digestive tract: Secondary | ICD-10-CM | POA: Diagnosis not present

## 2018-07-07 DIAGNOSIS — C182 Malignant neoplasm of ascending colon: Secondary | ICD-10-CM | POA: Diagnosis not present

## 2018-07-07 DIAGNOSIS — C786 Secondary malignant neoplasm of retroperitoneum and peritoneum: Secondary | ICD-10-CM | POA: Diagnosis not present

## 2018-07-07 DIAGNOSIS — Z79899 Other long term (current) drug therapy: Secondary | ICD-10-CM

## 2018-07-07 DIAGNOSIS — E785 Hyperlipidemia, unspecified: Secondary | ICD-10-CM

## 2018-07-07 DIAGNOSIS — Z800 Family history of malignant neoplasm of digestive organs: Secondary | ICD-10-CM

## 2018-07-07 DIAGNOSIS — Z95828 Presence of other vascular implants and grafts: Secondary | ICD-10-CM

## 2018-07-07 DIAGNOSIS — G893 Neoplasm related pain (acute) (chronic): Secondary | ICD-10-CM

## 2018-07-07 DIAGNOSIS — R112 Nausea with vomiting, unspecified: Secondary | ICD-10-CM

## 2018-07-07 DIAGNOSIS — E119 Type 2 diabetes mellitus without complications: Secondary | ICD-10-CM

## 2018-07-07 DIAGNOSIS — R162 Hepatomegaly with splenomegaly, not elsewhere classified: Secondary | ICD-10-CM

## 2018-07-07 DIAGNOSIS — T8090XA Unspecified complication following infusion and therapeutic injection, initial encounter: Secondary | ICD-10-CM

## 2018-07-07 DIAGNOSIS — R109 Unspecified abdominal pain: Secondary | ICD-10-CM

## 2018-07-07 DIAGNOSIS — M545 Low back pain: Secondary | ICD-10-CM

## 2018-07-07 DIAGNOSIS — Z8042 Family history of malignant neoplasm of prostate: Secondary | ICD-10-CM

## 2018-07-07 DIAGNOSIS — M6281 Muscle weakness (generalized): Secondary | ICD-10-CM

## 2018-07-07 DIAGNOSIS — M1990 Unspecified osteoarthritis, unspecified site: Secondary | ICD-10-CM

## 2018-07-07 DIAGNOSIS — I2510 Atherosclerotic heart disease of native coronary artery without angina pectoris: Secondary | ICD-10-CM

## 2018-07-07 DIAGNOSIS — I700 Atherosclerosis of aorta: Secondary | ICD-10-CM

## 2018-07-07 LAB — CMP (CANCER CENTER ONLY)
ALBUMIN: 3.6 g/dL (ref 3.5–5.0)
ALT: 17 U/L (ref 0–44)
AST: 17 U/L (ref 15–41)
Alkaline Phosphatase: 117 U/L (ref 38–126)
Anion gap: 8 (ref 5–15)
BILIRUBIN TOTAL: 0.8 mg/dL (ref 0.3–1.2)
BUN: 11 mg/dL (ref 6–20)
CO2: 27 mmol/L (ref 22–32)
CREATININE: 0.81 mg/dL (ref 0.61–1.24)
Calcium: 9.1 mg/dL (ref 8.9–10.3)
Chloride: 105 mmol/L (ref 98–111)
GFR, Est AFR Am: >60 mL/min (ref >60)
Glucose, Bld: 128 mg/dL — ABNORMAL HIGH (ref 70–99)
POTASSIUM: 4.1 mmol/L (ref 3.5–5.1)
Sodium: 140 mmol/L (ref 135–145)
TOTAL PROTEIN: 6.6 g/dL (ref 6.5–8.1)

## 2018-07-07 LAB — CBC WITH DIFFERENTIAL/PLATELET
ABS IMMATURE GRANULOCYTES: 0.02 10*3/uL (ref 0.00–0.07)
Basophils Absolute: 0.1 10*3/uL (ref 0.0–0.1)
Basophils Relative: 1 %
EOS PCT: 4 %
Eosinophils Absolute: 0.2 10*3/uL (ref 0.0–0.5)
HCT: 36.4 % — ABNORMAL LOW (ref 39.0–52.0)
HEMOGLOBIN: 11.4 g/dL — AB (ref 13.0–17.0)
Immature Granulocytes: 0 %
LYMPHS PCT: 11 %
Lymphs Abs: 0.7 10*3/uL (ref 0.7–4.0)
MCH: 26.1 pg (ref 26.0–34.0)
MCHC: 31.3 g/dL (ref 30.0–36.0)
MCV: 83.5 fL (ref 80.0–100.0)
MONO ABS: 0.6 10*3/uL (ref 0.1–1.0)
Monocytes Relative: 10 %
NEUTROS ABS: 4.5 10*3/uL (ref 1.7–7.7)
Neutrophils Relative %: 74 %
Platelets: 195 10*3/uL (ref 150–400)
RBC: 4.36 MIL/uL (ref 4.22–5.81)
RDW: 13.9 % (ref 11.5–15.5)
WBC: 6.1 10*3/uL (ref 4.0–10.5)
nRBC: 0 % (ref 0.0–0.2)

## 2018-07-07 MED ORDER — LORAZEPAM 0.5 MG PO TABS: 0.5000 mg | ORAL_TABLET | Freq: Three times a day (TID) | ORAL | 0 refills | Status: DC | PRN

## 2018-07-07 MED ORDER — FENTANYL 50 MCG/HR TD PT72: 50.0000 ug | MEDICATED_PATCH | TRANSDERMAL | 0 refills | Status: DC

## 2018-07-07 MED ORDER — ATROPINE SULFATE 1 MG/ML IJ SOLN
0.5000 mg | Freq: Once | INTRAMUSCULAR | Status: AC | PRN
  Administered 2018-07-07: 0.5 mg via INTRAVENOUS

## 2018-07-07 MED ORDER — DEXAMETHASONE SODIUM PHOSPHATE 10 MG/ML IJ SOLN
INTRAMUSCULAR | Status: AC
  Filled 2018-07-07: qty 1

## 2018-07-07 MED ORDER — HEPARIN SOD (PORK) LOCK FLUSH 100 UNIT/ML IV SOLN
500.0000 [IU] | Freq: Once | INTRAVENOUS | Status: DC | PRN
  Filled 2018-07-07: qty 5

## 2018-07-07 MED ORDER — SODIUM CHLORIDE 0.9 % IV SOLN
Freq: Once | INTRAVENOUS | Status: AC
  Administered 2018-07-07: 12:00:00 via INTRAVENOUS
  Filled 2018-07-07: qty 5

## 2018-07-07 MED ORDER — OXYCODONE HCL 5 MG PO TABS: 5.0000 mg | ORAL_TABLET | ORAL | 0 refills | Status: DC | PRN

## 2018-07-07 MED ORDER — PALONOSETRON HCL INJECTION 0.25 MG/5ML
INTRAVENOUS | Status: AC
  Filled 2018-07-07: qty 5

## 2018-07-07 MED ORDER — LOPERAMIDE HCL 2 MG PO CAPS
ORAL_CAPSULE | ORAL | Status: AC
  Filled 2018-07-07: qty 1

## 2018-07-07 MED ORDER — SODIUM CHLORIDE 0.9% FLUSH
10.0000 mL | INTRAVENOUS | Status: DC | PRN
  Administered 2018-07-07: 10 mL via INTRAVENOUS
  Filled 2018-07-07: qty 10

## 2018-07-07 MED ORDER — SODIUM CHLORIDE 0.9 % IV SOLN
Freq: Once | INTRAVENOUS | Status: AC
  Administered 2018-07-07: 12:00:00 via INTRAVENOUS
  Filled 2018-07-07: qty 250

## 2018-07-07 MED ORDER — FLUOROURACIL CHEMO INJECTION 2.5 GM/50ML
400.0000 mg/m2 | Freq: Once | INTRAVENOUS | Status: AC
  Administered 2018-07-07: 950 mg via INTRAVENOUS
  Filled 2018-07-07: qty 19

## 2018-07-07 MED ORDER — FAMOTIDINE IN NACL 20-0.9 MG/50ML-% IV SOLN
20.0000 mg | Freq: Once | INTRAVENOUS | Status: AC
  Administered 2018-07-07: 20 mg via INTRAVENOUS

## 2018-07-07 MED ORDER — SODIUM CHLORIDE 0.9 % IV SOLN
2435.0000 mg/m2 | INTRAVENOUS | Status: DC
  Administered 2018-07-07: 5800 mg via INTRAVENOUS
  Filled 2018-07-07: qty 116

## 2018-07-07 MED ORDER — DIPHENHYDRAMINE HCL 50 MG/ML IJ SOLN
25.0000 mg | Freq: Once | INTRAMUSCULAR | Status: AC
  Administered 2018-07-07: 25 mg via INTRAVENOUS

## 2018-07-07 MED ORDER — SODIUM CHLORIDE 0.9 % IV SOLN
180.0000 mg/m2 | Freq: Once | INTRAVENOUS | Status: AC
  Administered 2018-07-07: 440 mg via INTRAVENOUS
  Filled 2018-07-07: qty 15

## 2018-07-07 MED ORDER — SODIUM CHLORIDE 0.9 % IV SOLN
400.0000 mg/m2 | Freq: Once | INTRAVENOUS | Status: AC
  Administered 2018-07-07: 956 mg via INTRAVENOUS
  Filled 2018-07-07: qty 47.8

## 2018-07-07 MED ORDER — LOPERAMIDE HCL 2 MG PO TABS
2.0000 mg | ORAL_TABLET | Freq: Once | ORAL | Status: DC
  Filled 2018-07-07: qty 1

## 2018-07-07 MED ORDER — ATROPINE SULFATE 1 MG/ML IJ SOLN
INTRAMUSCULAR | Status: AC
  Filled 2018-07-07: qty 1

## 2018-07-07 MED ORDER — PALONOSETRON HCL INJECTION 0.25 MG/5ML
0.2500 mg | Freq: Once | INTRAVENOUS | Status: AC
  Administered 2018-07-07: 0.25 mg via INTRAVENOUS

## 2018-07-07 MED ORDER — SODIUM CHLORIDE 0.9% FLUSH
10.0000 mL | INTRAVENOUS | Status: DC | PRN
  Filled 2018-07-07: qty 10

## 2018-07-07 NOTE — Telephone Encounter (Signed)
Printed calendar and avs. °

## 2018-07-07 NOTE — Progress Notes (Signed)
FMLA for spouse, Janssen Zee, successfully faxed to [employer] at [phone]. Mailed copy to patient address on file.

## 2018-07-07 NOTE — Progress Notes (Signed)
Verbal order to hold Imodium unless needed per Dr. Irene Limbo since patient is not having diarrhea.  Patient c/o feeling hot and this RN observed the patient as diaphoretic. The Irinotecan and Leucovorin were paused, NS at 978mL/hr and Pepcid via IV. Sandi Mealy then arrived and assessed patient. VSS. Irinotecan and Leucovorin restarted at 15:06. After 10 minutes the patient c/o feeling hot and was diaphoretic. Sandi Mealy PA was called and this RN administered 25 mg IV Benadryl per verbal orders also administered the prn atropine 1.5 mg at 15:30 and restarted the Irinotecan Leucovorin at 15:40.  Patient aware of pump DC appointment on Saturday at 1:30 pm.

## 2018-07-07 NOTE — Progress Notes (Signed)
  Oncology Nurse Navigator Documentation     Wife asked to speak with me to find out what a Navigator can due to help her husband. Met with husband to introduce myself and to educate on role of Navigator. Patient shared what his cancer journey has been like and also shared some challenges.  Patient interested in a nutrition consult due to recent weight loss and taste changes. Patient is also interested in speaking to someone about his down mood. We explored the benefits of having someone outside of family and friends to process feelings and experiences. Dr. Irene Limbo has placed a referral to psychiatry. I will also reach out to Counseling intern in [place]. Patient has my contact information for questions and concerns. )

## 2018-07-07 NOTE — Telephone Encounter (Signed)
Called [facility], referral made to palliative care of , dr kale to be the attending, hospice physicians to do symptom management and have DNR  Signed per family wishes

## 2018-07-08 ENCOUNTER — Telehealth: Payer: Self-pay | Admitting: Hematology

## 2018-07-08 DIAGNOSIS — M9902 Segmental and somatic dysfunction of thoracic region: Secondary | ICD-10-CM | POA: Diagnosis not present

## 2018-07-08 DIAGNOSIS — M9903 Segmental and somatic dysfunction of lumbar region: Secondary | ICD-10-CM | POA: Diagnosis not present

## 2018-07-08 DIAGNOSIS — M9905 Segmental and somatic dysfunction of pelvic region: Secondary | ICD-10-CM | POA: Diagnosis not present

## 2018-07-08 NOTE — Progress Notes (Signed)
  Oncology Nurse Navigator Documentation   Patient scheduled to meet with Counseling Intern, Doris Cheadle on  07/19/18 @ 9 AM.   )

## 2018-07-08 NOTE — Telephone Encounter (Signed)
Scheduled appt per 11/22 sch message - per patient request to com e in on a day he is already here.

## 2018-07-09 ENCOUNTER — Inpatient Hospital Stay: Payer: 59

## 2018-07-09 VITALS — BP 107/77 | HR 96 | Temp 98.7°F | Resp 18

## 2018-07-09 DIAGNOSIS — C189 Malignant neoplasm of colon, unspecified: Secondary | ICD-10-CM

## 2018-07-09 DIAGNOSIS — C787 Secondary malignant neoplasm of liver and intrahepatic bile duct: Secondary | ICD-10-CM

## 2018-07-09 DIAGNOSIS — C182 Malignant neoplasm of ascending colon: Secondary | ICD-10-CM | POA: Diagnosis not present

## 2018-07-09 DIAGNOSIS — Z7189 Other specified counseling: Secondary | ICD-10-CM

## 2018-07-09 MED ORDER — HEPARIN SOD (PORK) LOCK FLUSH 100 UNIT/ML IV SOLN
500.0000 [IU] | Freq: Once | INTRAVENOUS | Status: AC | PRN
  Administered 2018-07-09: 500 [IU]
  Filled 2018-07-09: qty 5

## 2018-07-09 MED ORDER — SODIUM CHLORIDE 0.9% FLUSH
10.0000 mL | INTRAVENOUS | Status: DC | PRN
  Administered 2018-07-09: 10 mL
  Filled 2018-07-09: qty 10

## 2018-07-11 DIAGNOSIS — Z4689 Encounter for fitting and adjustment of other specified devices: Secondary | ICD-10-CM | POA: Diagnosis not present

## 2018-07-11 DIAGNOSIS — K831 Obstruction of bile duct: Secondary | ICD-10-CM | POA: Diagnosis not present

## 2018-07-11 DIAGNOSIS — G4730 Sleep apnea, unspecified: Secondary | ICD-10-CM | POA: Diagnosis not present

## 2018-07-12 DIAGNOSIS — M9905 Segmental and somatic dysfunction of pelvic region: Secondary | ICD-10-CM | POA: Diagnosis not present

## 2018-07-12 DIAGNOSIS — M9902 Segmental and somatic dysfunction of thoracic region: Secondary | ICD-10-CM | POA: Diagnosis not present

## 2018-07-12 DIAGNOSIS — M9903 Segmental and somatic dysfunction of lumbar region: Secondary | ICD-10-CM | POA: Diagnosis not present

## 2018-07-12 NOTE — Progress Notes (Signed)
    DATE:  07/07/2018                                        X CHEMO/IMMUNOTHERAPY REACTION            MD: Dr. Sullivan Lone   AGENT/BLOOD [place]:               Irinotecan and leucovorin   AGENT/BLOOD PRODUCT RECEIVING IMMEDIATELY PRIOR TO REACTION:           Irinotecan   VS: BP:      120/72   P:        79       SPO2:        98% on room air                  REACTION(S):            Hot flash and diaphoresis   PREMEDS:      [place]ox[place] and [place]   INTERVENTION: Pepcid 20 mg IV x1 and Benadryl 25 mg IV x1   Review of Systems  Review of Systems  Constitutional: Positive for diaphoresis. Negative for chills and fever.       Hot flash  HENT: Negative for trouble swallowing and voice change.   Respiratory: Negative for cough, chest tightness, shortness of breath and wheezing.   Cardiovascular: Negative for chest pain and palpitations.  Gastrointestinal: Negative for abdominal pain, constipation, diarrhea, nausea and vomiting.  Musculoskeletal: Negative for back pain and myalgias.  Neurological: Negative for dizziness, light-headedness and headaches.     Physical Exam  Physical Exam  Constitutional: No distress.  HENT:  Head: Normocephalic and atraumatic.  Cardiovascular: Normal rate, regular rhythm and normal heart sounds. Exam reveals no gallop and no friction rub.  No murmur heard. Pulmonary/Chest: Effort normal and breath sounds normal. No respiratory distress. He has no wheezes. He has no rales.  Neurological: He is alert.  Skin: Skin is warm and dry. No rash noted. He is not diaphoretic. No erythema.    OUTCOME:                 The patient's chemotherapy was restarted.  He was able to complete his therapy without any additional issues of concern.   Sandi Mealy, MHS, PA-C

## 2018-07-15 ENCOUNTER — Telehealth: Payer: Self-pay

## 2018-07-15 NOTE — Telephone Encounter (Signed)
Phone call placed to patient to introduce Palliative Care and to schedule visit with NP. VM left 

## 2018-07-19 ENCOUNTER — Telehealth: Payer: Self-pay

## 2018-07-19 ENCOUNTER — Other Ambulatory Visit: Payer: Self-pay | Admitting: Hematology

## 2018-07-19 NOTE — Progress Notes (Signed)
[facility] Counseling Intake Session  The patient presented to session with a calm mood and matching affect. The patient was forthcoming and engaged throughout session.  The patient shared that he is seeking counseling due to a referral from Dr. Irene Limbo. The patient reported that he is not sure of how he may benefit from counseling at this time, but he welcomes "being able to talk to someone." The patient reported that in the last 2 months, he has "started crying for no reason" twice. The patient expressed that this is concerning to him since it is not the norm for him, "That isn't me." The patient reported that the last time he encountered "crying for no reason," he was experiencing high physical pain and thoughts of "Is this how it's going to be now?" The patient expressed that changes in his physical ability have brought up feelings of guilt, powerlessness, and frustration. The patient reported that his work is important to him, as well as being self-sufficient and reliable, so having to take a few days off per week due to physical pain has been a significant loss. The denied any current or past suicidal ideation. The patient shared that his coping includes talking to his wife and coworkers, working, getting outside, and playing video games.  The patient and counselor discussed goals for counseling and the patient's presenting concerns. The patient and counselor will meet again on December 18 at 8 am.  Doris Cheadle, Counseling Intern [phone]

## 2018-07-19 NOTE — Telephone Encounter (Signed)
Phone call placed to wife, Anderson Malta, to schedule visit with Palliative NP. Scheduled for Thursday 07/21/18

## 2018-07-20 DIAGNOSIS — M9902 Segmental and somatic dysfunction of thoracic region: Secondary | ICD-10-CM | POA: Diagnosis not present

## 2018-07-20 DIAGNOSIS — M9905 Segmental and somatic dysfunction of pelvic region: Secondary | ICD-10-CM | POA: Diagnosis not present

## 2018-07-20 DIAGNOSIS — M9903 Segmental and somatic dysfunction of lumbar region: Secondary | ICD-10-CM | POA: Diagnosis not present

## 2018-07-20 NOTE — Progress Notes (Signed)
FMLA successfully faxed to [employer] Services at [phone]. Mailed copy to patient address on file.

## 2018-07-20 NOTE — Progress Notes (Signed)
[name]    HEMATOLOGY/ONCOLOGY CLINIC NOTE  Date of Service: 07/21/18    Patient Care Team: [name], MD as PCP - General (Family Medicine) [name] M.D. 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 KRAS mutated BRAF, NRAS mutation neg MSI Stable.  PET/CT scan done on 06/25/2016 Show multiple foci of hypermetabolic hepatic metastases (atleast 4-5) and 2 hypermetabolic peritoneal nodules along the dorsal peritoneal surface concerning for peritoneal metastases.  MRI Liver 07/27/2016 - Multiple small liver metastases throughout the right and left hepatic lobes, largest measuring 2.1 cm. 1.2 cm enhancing peritoneal nodule in left paracolic gutter, suspicious for peritoneal metastasis. Other small peritoneal nodules better visualized on recent PET-CT which showed hypermetabolic activity, also suspicious for peritoneal metastases.   CT CHEST WO CONTRAST 10/12/2016  [facility] Result Impression   1. Congenital variant of bilateral middle lobes. 2. Groundglass opacification with regions of bronchiectasis in the right lower lobe adjacent to the fissure, consistent with inflammatory/infectious changes. 3. No definitive evidence of metastatic disease to the chest. 4. Probable sebaceous cyst in the subcutaneous tissues beneath the left anterior chest.   MR ABDOMEN AND PELVIS HYWVPXTG FOLLOW UP2/26/2018 [facility] Result Impression    Decreased size of multiple hepatic lesions and two peritoneal nodules compared to outside MRI 07/27/2016. No new lesions identified in  the abdomen or pelvis.     PREVIOUS TREATMENT  FOLFOX x 10 cycles. 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 (without 5FU bolus - due to thrombocytopenia). S/p Y-90 treatment to 1 hepatic lobe.  Plan for prn hepatic Y90 radio-embolization   INTERVAL HISTORY:     Scott Gallagher is here for follow-up for his metastatic colon cancer and for C3 FOLFIRI. The patient's last visit with [place] was on 07/07/18. He is accompanied today by his wife.   The pt reports that he has had more pain, in the lower left abdomen. He notes that his right upper quadrant no longer hurts. He notes that he is not constipated, but does experience continuous pain when moving his bowels, denies characterization of cramping. He also notes that his lower left abdomen hurts when lying down. He also notes that his lower back continues to hurt.   The pt notes that he has been using about six 43m Oxycodone and his 559m Fentanyl patch each day. He adds that the pain reaches a 9 at peak and a 3 at minimum, and notes that his pain peaks to a 9 each day.  The pt notes that he feels he is doing better emotionally than at last visit, after being able to meet with a counselor in the interim.   The pt notes that his appetite is very weak, and if he eats too much he begins to feel nauseous. He notes that he has tried eating less food more frequently. He will be meeting with our nutritional therapist BaErnestene Kieloday.   Lab results today (07/21/18) of CBC w/diff and CMP is as follows: all values are WNL  except for HGB at 11.0, HCT at 35.2, MCH at 25.3, Lymphs abs at 600, Glucose at 175, Total Protein at 6.4. 07/21/18 CEA is down from 134 to 82.  On review of systems, pt reports weak appetite, lower central abdominal pain, lower back pain, moving his bowels, pain with bowel movements, and denies  vomiting, constipation, diarrhea, abdominal cramping, and any other symptoms.   MEDICAL HISTORY:  Past Medical History:  Diagnosis Date  . Anemia   . Arthritis   . Colon cancer ([name]) dx'd 05/2016  . Diabetes mellitus without complication (HCC)    diet controlled  . History of blood transfusion   . Hyperlipidemia   . liver mets dx'd 05/2016  . Sleep apnea    cpap  . Wears glasses     SURGICAL HISTORY: Past Surgical History:  Procedure Laterality Date  . COLON SURGERY    . IR GENERIC HISTORICAL  09/24/2016   IR CV LINE INJECTION 09/24/2016 [facility]  . IR GENERIC HISTORICAL  09/24/2016   IR US GUIDE VASC ACCESS RIGHT 09/24/2016 [facility]  . IR GENERIC HISTORICAL  09/24/2016   IR FLUORO GUIDE CV LINE RIGHT 09/24/2016 [facility]  . IR GENERIC HISTORICAL  09/30/2016   IR FLUORO GUIDE PORT INSERTION RIGHT 09/30/2016 Markus Daft, MD [facility]  . IR GENERIC HISTORICAL  09/30/2016   IR US GUIDE VASC ACCESS RIGHT 09/30/2016 Markus Daft, MD [facility]  . IR GENERIC HISTORICAL  09/30/2016   IR REMOVAL TUN ACCESS W/ PORT W/O FL MOD SED 09/30/2016 Markus Daft, MD [facility]  . LAPAROSCOPIC RIGHT HEMI COLECTOMY Right 06/17/2016   Procedure: LAPAROSCOPIC ASSISTED  RIGHT HEMI COLECTOMY;  Surgeon: [name], MD;  Location: [facility];  Service: General;  Laterality: Right;  . PORTACATH PLACEMENT Left 07/13/2016   Procedure: INSERTION PORT-A-CATH left subclavian;  Surgeon: [name], MD;  Location: [facility];  Service: General;  Laterality: Left;  . SHOULDER ACROMIOPLASTY Right 12/20/2014   Procedure: SHOULDER ACROMIOPLASTY;  Surgeon: Melrose Nakayama, MD;  Location: [place];  Service: Orthopedics;  Laterality: Right;  . SHOULDER ARTHROSCOPY Right 12/20/2014   Procedure: RIGHT ARTHROSCOPY SHOULDER WITH DEBRIDEMENT;  Surgeon: Melrose Nakayama, MD;  Location: [place];  Service: Orthopedics;  Laterality: Right;  . TESTICLE SURGERY     as teen  . TONSILLECTOMY    .  WISDOM TOOTH EXTRACTION      SOCIAL HISTORY: Social History   Socioeconomic History  . Marital status: Married    Spouse name: Not on file  . Number of children: Not on file  . Years of education: Not on file  . Highest education level: Not on file  Occupational History  . Occupation: cemetery maintenance  Social Needs  . Financial resource strain: Not on file  . Food insecurity:    Worry: Not on file    Inability: Not on file  . Transportation needs:    Medical: Not on file    Non-medical: Not on file  Tobacco Use  . Smoking status: Former Smoker    Packs/day: 1.50    Years: 29.00    Pack years: 43.50    Types: Cigarettes    Last attempt to quit: 12/16/2012    Years since quitting: 5.5  . Smokeless tobacco: Never Used  . Tobacco comment: 1-2 ppd from age [age]y to 2014  Substance and Sexual Activity  . Alcohol use: Yes    Alcohol/week: 3.0 standard drinks    Types: 3 Shots  of liquor per week    Comment:  some days  . Drug use: No  . Sexual activity: Not Currently    Birth control/protection: None  Lifestyle  . Physical activity:    Days per week: Not on file    Minutes per session: Not on file  . Stress: Not on file  Relationships  . Social connections:    Talks on phone: Not on file    Gets together: Not on file    Attends religious service: Not on file    Active member of club or organization: Not on file    Attends meetings of clubs or organizations: Not on file    Relationship status: Not on file  . Intimate partner violence:    Fear of current or ex partner: Not on file    Emotionally abused: Not on file    Physically abused: Not on file    Forced sexual activity: Not on file  Other Topics Concern  . Not on file  Social History Narrative  . Not on file    FAMILY HISTORY: Family History  Problem Relation Age of Onset  . Diabetes Other   . Hyperlipidemia Other   . Hypertension Other   . Breast cancer Maternal Aunt 70  . Prostate cancer Maternal  Uncle 75  . Lung cancer Paternal Uncle 81  . Stroke Maternal Grandfather 72  . Cancer Paternal Grandmother 2       dx cancer of pancreas and colon, unknown if separate primaries  . Heart Problems Paternal Grandfather        d. 56  . Prostate cancer Maternal Uncle 79  . Breast cancer Maternal Aunt 75  . Breast cancer Maternal Aunt 68  . Cervical cancer Maternal Aunt 68  . Pancreatic cancer Maternal Aunt 54       d. 58y; heavy smoker  . Colon cancer Paternal Uncle 40       s/p partial colectomy; dx. second colon cancer at age 83-64    ALLERGIES:  is allergic to penicillins.  MEDICATIONS:  Current Outpatient Medications  Medication Sig Dispense Refill  . CREON 12000 units CPEP capsule TAKE ONE CAPSULE BY MOUTH THREE TIMES DAILY BEFORE MEALS (Patient not taking: Reported on 07/21/2018) 90 capsule 0  . dexamethasone (DECADRON) 4 MG tablet TAKE 2 TABLETS BY MOUTH WITH FOOD DAILY (START THE DAY AFTER CHEMOTHERAPY FOR 2 DAYS) (Patient taking differently: Take 8 mg by mouth as directed. Take 2 tabs for 2 Days After Chemo) 30 tablet 0  . dronabinol (MARINOL) 5 MG capsule Take 1 capsule (5 mg total) by mouth 2 (two) times daily before a meal. 60 capsule 0  . fentaNYL (DURAGESIC - DOSED MCG/HR) 25 MCG/HR patch Place 1 patch (25 mcg total) onto the skin every 3 (three) days. Use in addition to 44mg/h patch for a total dose of 771m/h 10 patch 0  . fentaNYL (DURAGESIC - DOSED MCG/HR) 50 MCG/HR Place 1 patch (50 mcg total) onto the skin every 3 (three) days. 10 patch 0  . Insulin Glargine (BASAGLAR KWIKPEN) 100 UNIT/ML SOPN Inject 70 Units into the skin every morning.     . insulin lispro (HUMALOG) 100 UNIT/ML injection Inject into the skin 3 (three) times daily before meals.    . [name] Kitchenoperamide (IMODIUM A-D) 2 MG tablet Take 1-2 tablets (2-4 mg total) by mouth 4 (four) times daily as needed for diarrhea or loose stools. 100 tablet 1  . LORazepam (ATIVAN) 0.5 MG tablet Take 1-2  tablets (0.5-1 mg total)  by mouth every 8 (eight) hours as needed for anxiety (nausea). 30 tablet 0  . Melatonin 3 MG TABS Take 6 tablets by mouth at bedtime as needed (sleep).   0  . ondansetron (ZOFRAN ODT) 4 MG disintegrating tablet Take 1 tablet (4 mg total) by mouth every 8 (eight) hours as needed for nausea or vomiting. 10 tablet 0  . ondansetron (ZOFRAN) 8 MG tablet Take 1 tablet (8 mg total) by mouth 2 (two) times daily as needed for refractory nausea / vomiting. Start on day 3 after chemotherapy. 30 tablet 1  . Oxycodone HCl 10 MG TABS Take 1-2 tablets (10-20 mg total) by mouth every 4 (four) hours as needed. 120 tablet 0  . polyethylene glycol (MIRALAX) packet Take 17 g by mouth daily. (Patient taking differently: Take 17 g by mouth daily as needed for mild constipation. ) 30 each 1  . prochlorperazine (COMPAZINE) 10 MG tablet TAKE 1 TABLET BY MOUTH EVERY 6 HOURS AS NEEDED FOR NAUSEA / VOMITING 30 tablet 1  . senna-docusate (SENNA S) 8.6-50 MG tablet Take 2 tablets by mouth at bedtime. 60 tablet 1   No current facility-administered medications for this visit.     REVIEW OF SYSTEMS:    A 10+ POINT REVIEW OF SYSTEMS WAS OBTAINED including neurology, dermatology, psychiatry, cardiac, respiratory, lymph, extremities, GI, GU, Musculoskeletal, constitutional, breasts, reproductive, HEENT.  All pertinent positives are noted in the HPI.  All others are negative.   PHYSICAL EXAMINATION:  ECOG PERFORMANCE STATUS: 1 - Symptomatic but completely ambulatory   GENERAL:alert, in no acute distress and comfortable SKIN: no acute rashes, no significant lesions EYES: conjunctiva are pink and non-injected, sclera anicteric OROPHARYNX: MMM, no exudates, no oropharyngeal erythema or ulceration NECK: supple, no JVD LYMPH:  no palpable lymphadenopathy in the cervical, axillary or inguinal regions LUNGS: clear to auscultation b/l with normal respiratory effort HEART: regular rate & rhythm ABDOMEN:  normoactive bowel sounds ,  some mild tenderness to palpation in left central abdomen, not distended. No palpable hepatosplenomegaly. Healed surgical incision Extremity: no pedal edema PSYCH: alert & oriented x 3 with fluent speech NEURO: no focal motor/sensory deficits   LABORATORY DATA:  I have reviewed the data as listed  .[name] CBC Latest Ref Rng & Units 07/21/2018 07/07/2018 06/23/2018  WBC 4.0 - 10.5 K/uL 4.9 6.1 5.6  Hemoglobin 13.0 - 17.0 g/dL 11.0(L) 11.4(L) 11.4(L)  Hematocrit 39.0 - 52.0 % 35.2(L) 36.4(L) 36.9(L)  Platelets 150 - 400 K/uL 163 195 176   . CMP Latest Ref Rng & Units 07/21/2018 07/07/2018 06/23/2018  Glucose 70 - 99 mg/dL 175(H) 128(H) 167(H)  BUN 6 - 20 mg/dL 10 11 9   Creatinine 0.61 - 1.24 mg/dL 0.92 0.81 0.86  Sodium 135 - 145 mmol/L 139 140 139  Potassium 3.5 - 5.1 mmol/L 3.9 4.1 4.2  Chloride 98 - 111 mmol/L 103 105 104  CO2 22 - 32 mmol/L 27 27 26   Calcium 8.9 - 10.3 mg/dL 8.9 9.1 9.1  Total Protein 6.5 - 8.1 g/dL 6.4(L) 6.6 6.8  Total Bilirubin 0.3 - 1.2 mg/dL 0.7 0.8 0.5  Alkaline Phos 38 - 126 U/L 117 117 121  AST 15 - 41 U/L 17 17 19   ALT 0 - 44 U/L 12 17 17    04/14/18 Surgical Pathology:   .    Microscopic Comment 2. COLON AND RECTUM (INCLUDING TRANS-ANAL RESECTION): Specimen: Terminal ileum, right colon and appendix Procedure: Segmental resection Tumor site: Proximal ascending colon  Specimen integrity: Intact Macroscopic intactness of mesorectum: Not applicable: x Complete: NA Near complete: NA Incomplete: NA Cannot be determined (specify): NA Macroscopic tumor perforation: The mass invades through muscularis propria into pericolonic soft tissue Invasive tumor: Maximum size: 5.5 cm Histologic type(s): Adenocarcinoma Histologic grade and differentiation: G3 G1: well differentiated/low grade 1 of 4 Supplemental copy SUPPLEMENTAL for Zemanek, Shubh ([id number]) Microscopic Comment(continued) G2: moderately differentiated/low grade G3: poorly  differentiated/high grade G4: undifferentiated/high grade Type of polyp in which invasive carcinoma arose: Tubular adenoma Microscopic extension of invasive tumor: The mass invade through the muscularis propria into pericolonic soft tissue Lymph-Vascular invasion: Identified Peri-neural invasion: Identified Tumor deposit(s) (discontinuous extramural extension): Present Resection margins: Proximal margin: Negative Distal margin: Negative Circumferential (radial) (posterior ascending, posterior descending; lateral and posterior mid-rectum; and entire lower 1/3 rectum):Negative Mesenteric margin (sigmoid and transverse): NA Distance closest margin (if all above margins negative): 3.7 cm from the non peritonealized pericolic soft tissue margin Trans-anal resection margins only: Deep margin: NA Mucosal Margin: NA Distance closest mucosal margin (if negative): NA Treatment effect (neo-adjuvant therapy): NA Additional polyp(s): Negative Non-neoplastic findings: Unremarkable Lymph nodes: number examined 18; number positive: 5 Pathologic Staging: pT3, N2a, M1a Ancillary studies: MSI ordered      RADIOGRAPHIC STUDIES: I have personally reviewed the radiological images as listed and agreed with the findings in the report. CT abd/pelvis 12/09/2017: IMPRESSION: 1. Heterogeneous hepatic steatosis. Given this mild limitation, no evidence of progressive hepatic metastasis. Similar nonspecific capsular irregularity along the inferior right hepatic lobe. 2. Suspect developing omental/peritoneal metastasis, as evidenced by new and increased omental nodularity. Trace cul-de-sac fluid is nonspecific but could also be secondary. 3.  No acute process or evidence of metastatic disease in the chest. 4. Age advanced coronary artery atherosclerosis. Recommend assessment of coronary risk factors and consideration of medical therapy. 5.  Aortic Atherosclerosis (ICD10-I70.0). 6. Possible developing  cirrhosis.  Correlate with risk factors.   Electronically Signed   By: Abigail Miyamoto M.D.   On: 12/09/2017 14:23    MR LIVER WWO CONTRAST2/14/2019 [facility] Result Impression   1.Decreased sizes of multiple T2 hyperintense metastatic lesions (in the setting of patient's known colon adenocarcinoma with mucinous features) in both hepatic lobes (the majority of which are tiny). 2.No new lesions. 3.Posttreatment changes within the right hepatic lobe. 4.Other findings, as detailed above.  Result Narrative  MR LIVER WITH AND WITHOUT CONTRAST, 09/30/2017 10:20 AM  INDICATION: liver malignancy \ liver malignancy \ C18.9 Metastatic colon cancer to liver (HCC) \ C78.7 Metastatic colon cancer to liver ([place])  ADDITIONAL HISTORY: None. COMPARISON: MRI abdomen dated 07/01/2017.  TECHNIQUE: Multiplanar, multisequence MR images of the upper abdomen were obtained before and after intravenous administration of gadolinium-based contrast.   FINDINGS:  LOWER CHEST Heart: Normal size. No pericardial effusion. Lungs/pleura: No masses, consolidations, or effusions.  ABDOMEN Liver: Changes of prior radioembolization in the right hepatic lobe. Perfusional anomaly in the posterior right hepatic lobe, likely a venovenous shunt. Decreased sizes of multiple T2 hyperintense metastatic lesions throughout both hepatic lobes (the majority of which are tiny and are marked in PACS on series 5), with the dominant lesion measuring 10 mm in segment 8 (series 5, image 19), versus 12 mm previously. No new lesions. Gallbladder: Normal. No stones or inflammatory changes. Biliary: No obstruction. Spleen: Splenomegaly, with the spleen measuring 15.2 cm in span. Pancreas: Normal. Adrenals: Normal. Kidneys: No masses, stones, or obstruction. Unchanged fluid signal lesions in both kidneys, measuring up to 2.0 cm in  the interpolar region of the left kidney. Stomach/bowel: Unremarkable.     ASSESSMENT & PLAN:   47 y.o. 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 KRAS mutated BRAF, NRAS mutation neg MSI Stable.  PET/CT scan done on 06/25/2016 Show multiple foci of hypermetabolic hepatic metastases (atleast 4-5) and 2 hypermetabolic peritoneal nodules along the dorsal peritoneal surface concerning for peritoneal metastases.  MRI Liver 07/27/2016 - Multiple small liver metastases throughout the right and left hepatic lobes, largest measuring 2.1 cm. 1.2 cm enhancing peritoneal nodule in left paracolic gutter, suspicious for peritoneal metastasis. Other small peritoneal nodules better visualized on recent PET-CT which showed hypermetabolic activity, also suspicious for peritoneal metastases.   MRI Abd 10/12/2016 at [facility] --shows improvement in all the liver and the 2 peritoneal lesions and no new lesions.  MRI liver and CT C/A/P on 01/01/2017 as noted above showed improved/stable disease.   MRI Liver 07/01/2017 at [facility] -  1.Three T2 hyperintense metastatic lesions are seen in the liver, with the largest again in segment 8. These appear unchanged from prior, again without appreciable enhancement or restricted diffusion. Several of the previously noted tiny lesions are no longer visualized. No new lesions identified.  2.Treatment related changes in the posterior right hepatic lobe. 3.A peritoneal nodule adjacent to the splenic flexure is now more conspicuous compared to the prior study but appears similar to more remote imaging and again could relate to neoplastic disease, although is indeterminate.  CT Abd/pelvis 08/23/2017: Interval resolution of previously seen small right hepatic lobe lesion. Stable tiny sub-cm nodule along the capsular surface of the right hepatic lobe. No new or progressive disease identified.  Stable hepatic steatosis with areas of fatty sparing.  MRI 09/30/17:  Decreased sizes of multiple T2 hyperintense metastatic lesions (in the setting of patient's known colon adenocarcinoma with mucinous features) in both hepatic lobes (the majority of which are tiny). No new lesions. Posttreatment changes within the right hepatic lobe. Other findings, as detailed above.   12/09/17 CT C/A/P with pt which revealed Suspected developing omental/peritoneal metastasis, as evidenced by new and increased omental nodularity.  -CEA levels are slowing increasing and on 4/419 were increased to 11.87 and today are up to 27.26  02/10/18 MRI Liver Post treatment changes in the posterior right lobe. No significant change in the peripherally enhancing cystic lesion in segment 6 and another subcentimeter punctate T2 hyperintensity is identified in segment 7. The remaining previously reported lesions are not seen on today's exam. Continued attention on follow-up imaging is advised. 2.Interval development of multiple new peritoneal implants in the right anterior abdomen as detailed above and interval increase in size of the implant adjacent to the splenic flexure  02/21/18 CT C/A/P revealed Multiple scattered peritoneal implants in the upper to mid abdomen are similar compared to 02/10/2018.2.Additional tiny soft tissue implants in the lower abdomen and pelvis are technically new compared to 01/01/2017. Advise continued attention on follow-up.   05/08/18 CT A/P revealed Coronary arteriosclerosis. 2. Steatosis of the liver with indeterminate areas of hypodensity within the right hepatic lobe possibly representing post treatment change. New subtle lesions are not entirely excluded. Short-term interval follow-up and attention to these areas is recommended. MRI may also prove useful for better assessment with IV contrast. 3. Left upper pole parapelvic cyst, stable in appearance measuring approximately 2 cm in diameter. No  worrisome features. 4.  Small mesenteric nodules consistent with mildly enlarged mesenteric lymph nodes or peritoneal metastasis are noted in the right hemiabdomen and ventral upper abdomen. These are not definitively identified on prior exam and suspect these are new or have enlarged in the interim. 5. Mechanical bowel obstruction or inflammation. 6. New ventral midline surgical scarring post surgical change suspected in the mesentery.   06/12/18 CT A/P revealed Increased size and number of liver metastases. 2. Gallbladder is contracted and has a thickened wall, suspicious for acute cholecystitis. Unchanged appearance of biliary stent. 3. Recommend further evaluation with RIGHT UPPER QUADRANT ultrasound evaluated gallbladder. 4. Splenomegaly. 5. Increased mesenteric nodules, consistent with metastatic disease. 6. Small, irregular collections of mesenteric and pelvic fluid, suspicious for metastatic disease. 7.  Aortic atherosclerosis.   2) Mild thrombocytopenia- Resolved . PLT at 163k on 07/21/18.   3) Cancer related pain  PLAN:   -Discussed pt labwork today, 07/21/18; blood counts and chemistries are stable  -07/21/18 CEA is downtrending. -The pt has no prohibitive toxicities from continuing C3 FOLFIRI at this time. - Added Pepcid and Benadryl to pre-medications to help with the flushing -Aloxi has been added on for third day following infusion to better control delayed nausea. -Will increase to 21mg/hr Fentanyl patch  -Will increase prn dose of Oxycodone to 170m -Continue Senna S -Will order Marinol for appetite stimulation, which he prefers -Continue Zofran and Compazine for nausea control -Restart Ativan for additional nausea relief -Did refer the pt to palliative care-outpatient. -Did refer the pt to behavioral health as requested -Recommended that the pt take Creon and Gas-X -Will see the pt back in 2 weeks    4) Iron deficiency anemia due to GI bleeding from the tumor and some blood  loss with surgery. -resolved   #5  Patient Active Problem List   Diagnosis Date Noted  . Counseling regarding advanced care planning and goals of care 05/27/2017  . Chem[place]induced [place] ([place]04/11/2016  . Genetic testing 10/30/2016  . Port catheter in place 09/25/2016  . Family history of colon cancer 07/24/2016  . Family history of prostate cancer 07/24/2016  . Metastatic colon cancer to liver ([place]11/15/2017  . Right Colon cancer metastasized to liver ([place]11/08/2015  . Chronic GI bleeding 05/29/2016  . Diabetes mellitus type 2, diet-controlled ([place]CV[place]10/01/2016  . OSA (obstructive sleep apnea) 05/22/2016  . Hyperlipidemia 05/22/2016  . Anemia due to blood loss, chronic   . Antineoplastic chemotherapy induced pancytopenia (CODE) (HCEast [place]10/12/2015   Plan:  -Continue follow-up with primary care physician SwAntony ContrasMD for optimization of diabetes management - now on basal insulin with improving control.  -Continue use of CPAP for sleep apnea.   Please schedule next 3 cycles of FOLFIRI chemotherapy q2weeks with labs and MD visit    All of the patients questions were answered with apparent satisfaction. The patient knows to call the clinic with any problems, questions or concerns.  The total time spent in the appt was 30 minutes and more than 50% was on counseling and direct patient cares.   GaSullivan LoneD MSMeadowlakesAHIVMS SCAugusta Va Medical CenterTBapt[facility] Westover Hillsematology/Oncology Physician CoWoodlawn Hospital(Office):       [phone] cell):  [phone]):           [phone], ScBaldwin Jamaicaam acting as a scribe for Dr. GaSullivan Lone  .I have reviewed the above documentation for accuracy and completeness, and I agree with the above. .GBrunetta GeneraD

## 2018-07-21 ENCOUNTER — Inpatient Hospital Stay (HOSPITAL_BASED_OUTPATIENT_CLINIC_OR_DEPARTMENT_OTHER): Payer: 59 | Admitting: Hematology

## 2018-07-21 ENCOUNTER — Encounter: Payer: Self-pay | Admitting: Hematology

## 2018-07-21 ENCOUNTER — Other Ambulatory Visit: Payer: 59 | Admitting: Nurse Practitioner

## 2018-07-21 ENCOUNTER — Inpatient Hospital Stay: Payer: 59

## 2018-07-21 ENCOUNTER — Inpatient Hospital Stay: Payer: 59 | Attending: Hematology

## 2018-07-21 ENCOUNTER — Inpatient Hospital Stay: Payer: 59 | Admitting: Nutrition

## 2018-07-21 VITALS — BP 109/73 | HR 81 | Temp 98.4°F | Resp 20 | Ht 74.0 in | Wt 228.1 lb

## 2018-07-21 DIAGNOSIS — M1990 Unspecified osteoarthritis, unspecified site: Secondary | ICD-10-CM

## 2018-07-21 DIAGNOSIS — I700 Atherosclerosis of aorta: Secondary | ICD-10-CM | POA: Diagnosis not present

## 2018-07-21 DIAGNOSIS — Z794 Long term (current) use of insulin: Secondary | ICD-10-CM

## 2018-07-21 DIAGNOSIS — G893 Neoplasm related pain (acute) (chronic): Secondary | ICD-10-CM | POA: Diagnosis not present

## 2018-07-21 DIAGNOSIS — R110 Nausea: Secondary | ICD-10-CM | POA: Diagnosis not present

## 2018-07-21 DIAGNOSIS — D61810 Antineoplastic chemotherapy induced pancytopenia: Secondary | ICD-10-CM | POA: Diagnosis not present

## 2018-07-21 DIAGNOSIS — Z79899 Other long term (current) drug therapy: Secondary | ICD-10-CM

## 2018-07-21 DIAGNOSIS — Z95828 Presence of other vascular implants and grafts: Secondary | ICD-10-CM

## 2018-07-21 DIAGNOSIS — C189 Malignant neoplasm of colon, unspecified: Secondary | ICD-10-CM

## 2018-07-21 DIAGNOSIS — T451X5S Adverse effect of antineoplastic and immunosuppressive drugs, sequela: Secondary | ICD-10-CM | POA: Diagnosis not present

## 2018-07-21 DIAGNOSIS — Z5111 Encounter for antineoplastic chemotherapy: Secondary | ICD-10-CM | POA: Diagnosis not present

## 2018-07-21 DIAGNOSIS — R630 Anorexia: Secondary | ICD-10-CM | POA: Diagnosis not present

## 2018-07-21 DIAGNOSIS — K760 Fatty (change of) liver, not elsewhere classified: Secondary | ICD-10-CM | POA: Diagnosis not present

## 2018-07-21 DIAGNOSIS — Z8042 Family history of malignant neoplasm of prostate: Secondary | ICD-10-CM | POA: Diagnosis not present

## 2018-07-21 DIAGNOSIS — D500 Iron deficiency anemia secondary to blood loss (chronic): Secondary | ICD-10-CM | POA: Insufficient documentation

## 2018-07-21 DIAGNOSIS — Z87891 Personal history of nicotine dependence: Secondary | ICD-10-CM

## 2018-07-21 DIAGNOSIS — I2510 Atherosclerotic heart disease of native coronary artery without angina pectoris: Secondary | ICD-10-CM

## 2018-07-21 DIAGNOSIS — Z9049 Acquired absence of other specified parts of digestive tract: Secondary | ICD-10-CM | POA: Insufficient documentation

## 2018-07-21 DIAGNOSIS — E119 Type 2 diabetes mellitus without complications: Secondary | ICD-10-CM

## 2018-07-21 DIAGNOSIS — R161 Splenomegaly, not elsewhere classified: Secondary | ICD-10-CM | POA: Insufficient documentation

## 2018-07-21 DIAGNOSIS — C182 Malignant neoplasm of ascending colon: Secondary | ICD-10-CM | POA: Diagnosis present

## 2018-07-21 DIAGNOSIS — Z803 Family history of malignant neoplasm of breast: Secondary | ICD-10-CM | POA: Diagnosis not present

## 2018-07-21 DIAGNOSIS — E785 Hyperlipidemia, unspecified: Secondary | ICD-10-CM | POA: Insufficient documentation

## 2018-07-21 DIAGNOSIS — C787 Secondary malignant neoplasm of liver and intrahepatic bile duct: Secondary | ICD-10-CM

## 2018-07-21 DIAGNOSIS — Z801 Family history of malignant neoplasm of trachea, bronchus and lung: Secondary | ICD-10-CM | POA: Insufficient documentation

## 2018-07-21 DIAGNOSIS — Z7189 Other specified counseling: Secondary | ICD-10-CM

## 2018-07-21 DIAGNOSIS — Z800 Family history of malignant neoplasm of digestive organs: Secondary | ICD-10-CM | POA: Diagnosis not present

## 2018-07-21 DIAGNOSIS — C786 Secondary malignant neoplasm of retroperitoneum and peritoneum: Secondary | ICD-10-CM

## 2018-07-21 LAB — CBC WITH DIFFERENTIAL/PLATELET
ABS IMMATURE GRANULOCYTES: 0.01 10*3/uL (ref 0.00–0.07)
BASOS ABS: 0 10*3/uL (ref 0.0–0.1)
Basophils Relative: 1 %
Eosinophils Absolute: 0.2 10*3/uL (ref 0.0–0.5)
Eosinophils Relative: 4 %
HCT: 35.2 % — ABNORMAL LOW (ref 39.0–52.0)
HEMOGLOBIN: 11 g/dL — AB (ref 13.0–17.0)
Immature Granulocytes: 0 %
LYMPHS PCT: 13 %
Lymphs Abs: 0.6 10*3/uL — ABNORMAL LOW (ref 0.7–4.0)
MCH: 25.3 pg — ABNORMAL LOW (ref 26.0–34.0)
MCHC: 31.3 g/dL (ref 30.0–36.0)
MCV: 81.1 fL (ref 80.0–100.0)
Monocytes Absolute: 0.5 10*3/uL (ref 0.1–1.0)
Monocytes Relative: 10 %
NEUTROS ABS: 3.5 10*3/uL (ref 1.7–7.7)
NRBC: 0 % (ref 0.0–0.2)
Neutrophils Relative %: 72 %
PLATELETS: 163 10*3/uL (ref 150–400)
RBC: 4.34 MIL/uL (ref 4.22–5.81)
RDW: 14.6 % (ref 11.5–15.5)
WBC: 4.9 10*3/uL (ref 4.0–10.5)

## 2018-07-21 LAB — CMP (CANCER CENTER ONLY)
ALT: 12 U/L (ref 0–44)
ANION GAP: 9 (ref 5–15)
AST: 17 U/L (ref 15–41)
Albumin: 3.7 g/dL (ref 3.5–5.0)
Alkaline Phosphatase: 117 U/L (ref 38–126)
BUN: 10 mg/dL (ref 6–20)
CALCIUM: 8.9 mg/dL (ref 8.9–10.3)
CO2: 27 mmol/L (ref 22–32)
Chloride: 103 mmol/L (ref 98–111)
Creatinine: 0.92 mg/dL (ref 0.61–1.24)
GFR, Est AFR Am: >60 mL/min (ref >60)
Glucose, Bld: 175 mg/dL — ABNORMAL HIGH (ref 70–99)
Potassium: 3.9 mmol/L (ref 3.5–5.1)
Sodium: 139 mmol/L (ref 135–145)
TOTAL PROTEIN: 6.4 g/dL — AB (ref 6.5–8.1)
Total Bilirubin: 0.7 mg/dL (ref 0.3–1.2)

## 2018-07-21 LAB — CEA (IN HOUSE-CHCC): CEA (CHCC-IN HOUSE): 82.16 ng/mL — AB (ref 0.00–5.00)

## 2018-07-21 MED ORDER — OXYCODONE HCL 10 MG PO TABS: 10.0000 mg | ORAL_TABLET | ORAL | 0 refills | Status: DC | PRN

## 2018-07-21 MED ORDER — SODIUM CHLORIDE 0.9 % IV SOLN
Freq: Once | INTRAVENOUS | Status: AC
  Administered 2018-07-21: 10:00:00 via INTRAVENOUS
  Filled 2018-07-21: qty 250

## 2018-07-21 MED ORDER — SODIUM CHLORIDE 0.9 % IV SOLN
Freq: Once | INTRAVENOUS | Status: AC
  Administered 2018-07-21: 10:00:00 via INTRAVENOUS
  Filled 2018-07-21: qty 5

## 2018-07-21 MED ORDER — ATROPINE SULFATE 1 MG/ML IJ SOLN
0.5000 mg | Freq: Once | INTRAMUSCULAR | Status: AC | PRN
  Administered 2018-07-21: 0.5 mg via INTRAVENOUS

## 2018-07-21 MED ORDER — LOPERAMIDE HCL 2 MG PO TABS
2.0000 mg | ORAL_TABLET | Freq: Once | ORAL | Status: DC
  Filled 2018-07-21: qty 1

## 2018-07-21 MED ORDER — FENTANYL 25 MCG/HR TD PT72: 25.0000 ug | MEDICATED_PATCH | TRANSDERMAL | 0 refills | Status: DC

## 2018-07-21 MED ORDER — SODIUM CHLORIDE 0.9 % IV SOLN
180.0000 mg/m2 | Freq: Once | INTRAVENOUS | Status: AC
  Administered 2018-07-21: 440 mg via INTRAVENOUS
  Filled 2018-07-21: qty 15

## 2018-07-21 MED ORDER — SODIUM CHLORIDE 0.9 % IV SOLN
2425.0000 mg/m2 | INTRAVENOUS | Status: DC
  Administered 2018-07-21: 5800 mg via INTRAVENOUS
  Filled 2018-07-21: qty 116

## 2018-07-21 MED ORDER — SODIUM CHLORIDE 0.9 % IV SOLN
Freq: Once | INTRAVENOUS | Status: AC
  Administered 2018-07-21: 08:00:00 via INTRAVENOUS
  Filled 2018-07-21: qty 250

## 2018-07-21 MED ORDER — DIPHENHYDRAMINE HCL 50 MG/ML IJ SOLN
INTRAMUSCULAR | Status: AC
  Filled 2018-07-21: qty 1

## 2018-07-21 MED ORDER — SODIUM CHLORIDE 0.9 % IV SOLN
400.0000 mg/m2 | Freq: Once | INTRAVENOUS | Status: AC
  Administered 2018-07-21: 956 mg via INTRAVENOUS
  Filled 2018-07-21: qty 47.8

## 2018-07-21 MED ORDER — PALONOSETRON HCL INJECTION 0.25 MG/5ML
0.2500 mg | Freq: Once | INTRAVENOUS | Status: AC
  Administered 2018-07-21: 0.25 mg via INTRAVENOUS

## 2018-07-21 MED ORDER — ATROPINE SULFATE 1 MG/ML IJ SOLN
INTRAMUSCULAR | Status: AC
  Filled 2018-07-21: qty 1

## 2018-07-21 MED ORDER — FAMOTIDINE IN NACL 20-0.9 MG/50ML-% IV SOLN
20.0000 mg | Freq: Once | INTRAVENOUS | Status: AC
  Administered 2018-07-21: 20 mg via INTRAVENOUS

## 2018-07-21 MED ORDER — DRONABINOL 5 MG PO CAPS: 5.0000 mg | ORAL_CAPSULE | Freq: Two times a day (BID) | ORAL | 0 refills | Status: DC

## 2018-07-21 MED ORDER — FAMOTIDINE IN NACL 20-0.9 MG/50ML-% IV SOLN
INTRAVENOUS | Status: AC
  Filled 2018-07-21: qty 50

## 2018-07-21 MED ORDER — LOPERAMIDE HCL 2 MG PO CAPS
ORAL_CAPSULE | ORAL | Status: AC
  Filled 2018-07-21: qty 1

## 2018-07-21 MED ORDER — DIPHENHYDRAMINE HCL 50 MG/ML IJ SOLN
25.0000 mg | Freq: Once | INTRAMUSCULAR | Status: AC
  Administered 2018-07-21: 25 mg via INTRAVENOUS

## 2018-07-21 MED ORDER — FLUOROURACIL CHEMO INJECTION 2.5 GM/50ML
400.0000 mg/m2 | Freq: Once | INTRAVENOUS | Status: AC
  Administered 2018-07-21: 950 mg via INTRAVENOUS
  Filled 2018-07-21: qty 19

## 2018-07-21 MED ORDER — PALONOSETRON HCL INJECTION 0.25 MG/5ML
INTRAVENOUS | Status: AC
  Filled 2018-07-21: qty 5

## 2018-07-21 NOTE — Progress Notes (Unsigned)
[facility] Telephone: [phone] Fax: [phone]  PATIENT NAME: Scott Gallagher DOB: 1970/08/20 MRN: [id number]  PRIMARY CARE PROVIDER:   [name], MD  REFERRING PROVIDER:  [name], MD [address], [place] [address]  RESPONSIBLE PARTY:       HISTORY OF PRESENT ILLNESS:  Scott Gallagher is a 47 y.o. year old male with multiple medical problems including ***. Palliative Care was asked to help address goals of care.   Collateral information: decreased appetite (259 8/19)) now 228; fatigue; sleep pattern varies according to pain. Pain lower abdomen, back,   ASSESSMENT/PLAN/RECOMMENDATIONS  Colon cancer 2017) with liver mets -followed by Dr. Sullivan Lone (oncology)                   I spent *** minutes providing this consultation,  from *** to ***. More than 50% of the time in this consultation was spent coordinating communication.     CODE STATUS:   PPS: 0% HOSPICE ELIGIBILITY/DIAGNOSIS: TBD  PAST MEDICAL HISTORY:  Past Medical History:  Diagnosis Date  . Anemia   . Arthritis   . Colon cancer ([place]) dx'd 05/2016  . Diabetes mellitus without complication (HCC)    diet controlled  . History of blood transfusion   . Hyperlipidemia   . liver mets dx'd 05/2016  . Sleep apnea    cpap  . Wears glasses     SOCIAL HX:  Social History   Tobacco Use  . Smoking status: Former Smoker    Packs/day: 1.50    Years: 29.00    Pack years: 43.50    Types: Cigarettes    Last attempt to quit: 12/16/2012    Years since quitting: 5.5  . Smokeless tobacco: Never Used  . Tobacco comment: 1-2 ppd from age [age]y to 2014  Substance Use Topics  . Alcohol use: Yes    Alcohol/week: 3.0 standard drinks    Types: 3 Shots of liquor per week    Comment:  some days    ALLERGIES:  Allergies  Allergen Reactions  . Penicillins     Was told as a child he was allergic does not know of type of reaction.   Has patient had a PCN reaction  causing immediate rash, facial/tongue/throat swelling, SOB or lightheadedness with hypotension: N Has patient had a PCN reaction causing severe rash involving mucus membranes or skin necrosis: N Has patient had a PCN reaction that required hospitalization: N Has patient had a PCN reaction occurring within the last 10 years: N If all of the above answers are "NO", then may proceed with Cephalospo     PERTINENT MEDICATIONS:  Outpatient Encounter Medications as of 07/21/2018  Medication Sig  . CREON 12000 units CPEP capsule TAKE ONE CAPSULE BY MOUTH THREE TIMES DAILY BEFORE MEALS  . dexamethasone (DECADRON) 4 MG tablet TAKE 2 TABLETS BY MOUTH WITH FOOD DAILY (START THE DAY AFTER CHEMOTHERAPY FOR 2 DAYS) (Patient taking differently: Take 8 mg by mouth as directed. Take 2 tabs for 2 Days After Chemo)  . fentaNYL (DURAGESIC - DOSED MCG/HR) 50 MCG/HR Place 1 patch (50 mcg total) onto the skin every 3 (three) days.  . Insulin Glargine (BASAGLAR KWIKPEN) 100 UNIT/ML SOPN Inject 70 Units into the skin every morning.   . insulin lispro (HUMALOG) 100 UNIT/ML injection Inject into the skin 3 (three) times daily before meals.  [name] loperamide (IMODIUM A-D) 2 MG tablet Take 1-2 tablets (2-4 mg total) by mouth 4 (four)  times daily as needed for diarrhea or loose stools.  [name] LORazepam (ATIVAN) 0.5 MG tablet Take 1-2 tablets (0.5-1 mg total) by mouth every 8 (eight) hours as needed for anxiety (nausea).  . Melatonin 3 MG TABS Take 6 tablets by mouth at bedtime as needed (sleep).   . ondansetron (ZOFRAN ODT) 4 MG disintegrating tablet Take 1 tablet (4 mg total) by mouth every 8 (eight) hours as needed for nausea or vomiting.  . ondansetron (ZOFRAN) 8 MG tablet Take 1 tablet (8 mg total) by mouth 2 (two) times daily as needed for refractory nausea / vomiting. Start on day 3 after chemotherapy.  [name] oxyCODONE (OXY IR/ROXICODONE) 5 MG immediate release tablet Take 1-2 tablets (5-10 mg total) by mouth every 4 (four) hours as  needed for severe pain.  . polyethylene glycol (MIRALAX) packet Take 17 g by mouth daily. (Patient taking differently: Take 17 g by mouth daily as needed for mild constipation. )  . prochlorperazine (COMPAZINE) 10 MG tablet TAKE 1 TABLET BY MOUTH EVERY 6 HOURS AS NEEDED FOR NAUSEA / VOMITING  . senna-docusate (SENNA S) 8.6-50 MG tablet Take 2 tablets by mouth at bedtime.   No facility-administered encounter medications on file as of 07/21/2018.     PHYSICAL EXAM:   General: NAD, frail appearing, thin Cardiovascular: regular rate and rhythm Pulmonary: clear ant fields Abdomen: soft, nontender, + bowel sounds GU: no suprapubic tenderness Extremities: no edema, no joint deformities Skin: no rashes Neurological: Weakness but otherwise nonfocal  Stephanie G Martinique, NP

## 2018-07-21 NOTE — Patient Instructions (Signed)
Thank you for choosing [facility] to provide your oncology and hematology care.  To afford each patient quality time with our providers, please arrive 30 minutes before your scheduled appointment time.  If you arrive late for your appointment, you may be asked to reschedule.  We strive to give you quality time with our providers, and arriving late affects you and other patients whose appointments are after yours.    If you are a no show for multiple scheduled visits, you may be dismissed from the clinic at the providers discretion.     Again, thank you for choosing [facility], our hope is that these requests will decrease the amount of time that you wait before being seen by our physicians.  ______________________________________________________________________   Should you have questions after your visit to the [facility], please contact our office at [phone] between the hours of 8:30 and 4:30 p.m.    Voicemails left after 4:30p.m will not be returned until the following business day.     For prescription refill requests, please have your pharmacy contact us directly.  Please also try to allow 48 hours for prescription requests.     Please contact the scheduling department for questions regarding scheduling.  For scheduling of procedures such as PET scans, CT scans, MRI, Ultrasound, etc please contact central scheduling at [phone].     Resources For Cancer Patients and Caregivers:    Oncolink.org:  A wonderful resource for patients and healthcare providers for information regarding your disease, ways to tract your treatment, what to expect, etc.      American Cancer [employer]:  800-227-2345  Can help patients locate various types of support and financial assistance   Cancer Care: 1-800-813-HOPE (4673) Provides financial assistance, online support groups, medication/co-pay assistance.     [place] DSS:  336-641-3447 Where to apply  for food stamps, Medicaid, and utility assistance   Medicare Rights Center: 800-333-4114 Helps people with Medicare understand their rights and benefits, navigate the Medicare system, and secure the quality healthcare they deserve   SCAT: 336-333-6589 [place] [employer]'s shared-ride transportation service for eligible riders who have a disability that prevents them from riding the fixed route bus.     For additional information on assistance programs please contact our social worker:   Abigail Elmore:  [phone]  

## 2018-07-21 NOTE — Patient Instructions (Signed)
[facility] Discharge Instructions for Patients Receiving Chemotherapy  Today you received the following chemotherapy agents Irinotecan, Leucovorin, 5FU.  To help prevent nausea and vomiting after your treatment, we encourage you to take your nausea medication.   If you develop nausea and vomiting that is not controlled by your nausea medication, call the clinic.   BELOW ARE SYMPTOMS THAT SHOULD BE REPORTED IMMEDIATELY:  *FEVER GREATER THAN 100.5 F  *CHILLS WITH OR WITHOUT FEVER  NAUSEA AND VOMITING THAT IS NOT CONTROLLED WITH YOUR NAUSEA MEDICATION  *UNUSUAL SHORTNESS OF BREATH  *UNUSUAL BRUISING OR BLEEDING  TENDERNESS IN MOUTH AND THROAT WITH OR WITHOUT PRESENCE OF ULCERS  *URINARY PROBLEMS  *BOWEL PROBLEMS  UNUSUAL RASH Items with * indicate a potential emergency and should be followed up as soon as possible.  Feel free to call the clinic should you have any questions or concerns. The clinic phone number is [phone].  Please show the [facility] at check-in to the Emergency Department and triage nurse.  Irinotecan injection What is this medicine? IRINOTECAN (ir in oh TEE kan ) is a chemotherapy drug. It is used to treat colon and rectal cancer. This medicine may be used for other purposes; ask your health care provider or pharmacist if you have questions. COMMON BRAND NAME(S): Camptosar What should I tell my health care provider before I take this medicine? They need to know if you have any of these conditions: -blood disorders -dehydration -diarrhea -infection (especially a virus infection such as chickenpox, cold sores, or herpes) -liver disease -low blood counts, like low white cell, platelet, or red cell counts -recent or ongoing radiation therapy -an unusual or allergic reaction to irinotecan, sorbitol, other chemotherapy, other medicines, foods, dyes, or preservatives -pregnant or trying to get pregnant -breast-feeding How should I  use this medicine? This drug is given as an infusion into a vein. It is administered in a hospital or clinic by a specially trained health care professional. Talk to your pediatrician regarding the use of this medicine in children. Special care may be needed. Overdosage: If you think you have taken too much of this medicine contact a poison control center or emergency room at once. NOTE: This medicine is only for you. Do not share this medicine with others. What if I miss a dose? It is important not to miss your dose. Call your doctor or health care professional if you are unable to keep an appointment. What may interact with this medicine? Do not take this medicine with any of the following medications: -atazanavir -certain medicines for fungal infections like itraconazole and ketoconazole -St. John's Wort This medicine may also interact with the following medications: -dexamethasone -diuretics -laxatives -medicines for seizures like carbamazepine, mephobarbital, phenobarbital, phenytoin, primidone -medicines to increase blood counts like filgrastim, pegfilgrastim, sargramostim -prochlorperazine -vaccines This list may not describe all possible interactions. Give your health care provider a list of all the medicines, herbs, non-prescription drugs, or dietary supplements you use. Also tell them if you smoke, drink alcohol, or use illegal drugs. Some items may interact with your medicine. What should I watch for while using this medicine? Your condition will be monitored carefully while you are receiving this medicine. You will need important blood work done while you are taking this medicine. This drug may make you feel generally unwell. This is not uncommon, as chemotherapy can affect healthy cells as well as cancer cells. Report any side effects. Continue your course of treatment even though you feel  ill unless your doctor tells you to stop. In some cases, you may be given additional  medicines to help with side effects. Follow all directions for their use. You may get drowsy or dizzy. Do not drive, use machinery, or do anything that needs mental alertness until you know how this medicine affects you. Do not stand or sit up quickly, especially if you are an older patient. This reduces the risk of dizzy or fainting spells. Call your doctor or health care professional for advice if you get a fever, chills or sore throat, or other symptoms of a cold or flu. Do not treat yourself. This drug decreases your body's ability to fight infections. Try to avoid being around people who are sick. This medicine may increase your risk to bruise or bleed. Call your doctor or health care professional if you notice any unusual bleeding. Be careful brushing and flossing your teeth or using a toothpick because you may get an infection or bleed more easily. If you have any dental work done, tell your dentist you are receiving this medicine. Avoid taking products that contain aspirin, acetaminophen, ibuprofen, naproxen, or ketoprofen unless instructed by your doctor. These medicines may hide a fever. Do not become pregnant while taking this medicine. Women should inform their doctor if they wish to become pregnant or think they might be pregnant. There is a potential for serious side effects to an unborn child. Talk to your health care professional or pharmacist for more information. Do not breast-feed an infant while taking this medicine. What side effects may I notice from receiving this medicine? Side effects that you should report to your doctor or health care professional as soon as possible: -allergic reactions like skin rash, itching or hives, swelling of the face, lips, or tongue -low blood counts - this medicine may decrease the number of white blood cells, red blood cells and platelets. You may be at increased risk for infections and bleeding. -signs of infection - fever or chills, cough, sore  throat, pain or difficulty passing urine -signs of decreased platelets or bleeding - bruising, pinpoint red spots on the skin, black, tarry stools, blood in the urine -signs of decreased red blood cells - unusually weak or tired, fainting spells, lightheadedness -breathing problems -chest pain -diarrhea -feeling faint or lightheaded, falls -flushing, runny nose, sweating during infusion -mouth sores or pain -pain, swelling, redness or irritation where injected -pain, swelling, warmth in the leg -pain, tingling, numbness in the hands or feet -problems with balance, talking, walking -stomach cramps, pain -trouble passing urine or change in the amount of urine -vomiting as to be unable to hold down drinks or food -yellowing of the eyes or skin Side effects that usually do not require medical attention (report to your doctor or health care professional if they continue or are bothersome): -constipation -hair loss -headache -loss of appetite -nausea, vomiting -stomach upset This list may not describe all possible side effects. Call your doctor for medical advice about side effects. You may report side effects to FDA at 1-800-FDA-1088. Where should I keep my medicine? This drug is given in a hospital or clinic and will not be stored at home. NOTE: This sheet is a summary. It may not cover all possible information. If you have questions about this medicine, talk to your doctor, pharmacist, or health care provider.  2018 Elsevier/Gold Standard (2013-01-30 16:29:32)  Leucovorin injection What is this medicine? LEUCOVORIN (loo koe VOR in) is used to prevent or treat the harmful  effects of some medicines. This medicine is used to treat anemia caused by a low amount of folic acid in the body. It is also used with 5-fluorouracil (5-FU) to treat colon cancer. This medicine may be used for other purposes; ask your health care provider or pharmacist if you have questions. What should I tell my  health care provider before I take this medicine? They need to know if you have any of these conditions: -anemia from low levels of vitamin B-12 in the blood -an unusual or allergic reaction to leucovorin, folic acid, other medicines, foods, dyes, or preservatives -pregnant or trying to get pregnant -breast-feeding How should I use this medicine? This medicine is for injection into a muscle or into a vein. It is given by a health care professional in a hospital or clinic setting. Talk to your pediatrician regarding the use of this medicine in children. Special care may be needed. Overdosage: If you think you have taken too much of this medicine contact a poison control center or emergency room at once. NOTE: This medicine is only for you. Do not share this medicine with others. What if I miss a dose? This does not apply. What may interact with this medicine? -capecitabine -fluorouracil -phenobarbital -phenytoin -primidone -trimethoprim-sulfamethoxazole This list may not describe all possible interactions. Give your health care provider a list of all the medicines, herbs, non-prescription drugs, or dietary supplements you use. Also tell them if you smoke, drink alcohol, or use illegal drugs. Some items may interact with your medicine. What should I watch for while using this medicine? Your condition will be monitored carefully while you are receiving this medicine. This medicine may increase the side effects of 5-fluorouracil, 5-FU. Tell your doctor or health care professional if you have diarrhea or mouth sores that do not get better or that get worse. What side effects may I notice from receiving this medicine? Side effects that you should report to your doctor or health care professional as soon as possible: -allergic reactions like skin rash, itching or hives, swelling of the face, lips, or tongue -breathing problems -fever, infection -mouth sores -unusual bleeding or  bruising -unusually weak or tired Side effects that usually do not require medical attention (report to your doctor or health care professional if they continue or are bothersome): -constipation or diarrhea -loss of appetite -nausea, vomiting This list may not describe all possible side effects. Call your doctor for medical advice about side effects. You may report side effects to FDA at 1-800-FDA-1088. Where should I keep my medicine? This drug is given in a hospital or clinic and will not be stored at home. NOTE: This sheet is a summary. It may not cover all possible information. If you have questions about this medicine, talk to your doctor, pharmacist, or health care provider.  2018 Elsevier/Gold Standard (2008-02-07 16:50:29)  Fluorouracil, 5FU; Diclofenac topical cream What is this medicine? FLUOROURACIL; DICLOFENAC (flure oh YOOR a sil; dye KLOE fen ak) is a combination of a topical chemotherapy agent and non-steroidal anti-inflammatory drug (NSAID). It is used on the skin to treat skin cancer and skin conditions that could become cancer. This medicine may be used for other purposes; ask your health care provider or pharmacist if you have questions. COMMON BRAND NAME(S): FLUORAC What should I tell my health care provider before I take this medicine? They need to know if you have any of these conditions: -bleeding problems -cigarette smoker -DPD enzyme deficiency -heart disease -high blood pressure -if you  frequently drink alcohol containing drinks -kidney disease -liver disease -open or infected skin -stomach problems -swelling or open sores at the treatment site -recent or planned coronary artery bypass graft (CABG) surgery -an unusual or allergic reaction to fluorouracil, diclofenac, aspirin, other NSAIDs, other medicines, foods, dyes, or preservatives -pregnant or trying to get pregnant -breast-feeding How should I use this medicine? This medicine is only for use on the  skin. Follow the directions on the prescription label. Wash hands before and after use. Wash affected area and gently pat dry. To apply this medicine use a cotton-tipped applicator, or use gloves if applying with fingertips. If applied with unprotected fingertips, it is very important to wash your hands well after you apply this medicine. Avoid applying to the eyes, nose, or mouth. Apply enough medicine to cover the affected area. You can cover the area with a light gauze dressing, but do not use tight or air-tight dressings. Finish the full course prescribed by your doctor or health care professional, even if you think your condition is better. Do not stop taking except on the advice of your doctor or health care professional. Talk to your pediatrician regarding the use of this medicine in children. Special care may be needed. Overdosage: If you think you have taken too much of this medicine contact a poison control center or emergency room at once. NOTE: This medicine is only for you. Do not share this medicine with others. What if I miss a dose? If you miss a dose, apply it as soon as you can. If it is almost time for your next dose, only use that dose. Do not apply extra doses. Contact your doctor or health care professional if you miss more than one dose. What may interact with this medicine? Interactions are not expected. Do not use any other skin products without telling your doctor or health care professional. This list may not describe all possible interactions. Give your health care provider a list of all the medicines, herbs, non-prescription drugs, or dietary supplements you use. Also tell them if you smoke, drink alcohol, or use illegal drugs. Some items may interact with your medicine. What should I watch for while using this medicine? Visit your doctor or health care professional for checks on your progress. You will need to use this medicine for 2 to 6 weeks. This may be longer depending on  the condition being treated. You may not see full healing for another 1 to 2 months after you stop using the medicine. Treated areas of skin can look unsightly during and for several weeks after treatment with this medicine. This medicine can make you more sensitive to the sun. Keep out of the sun. If you cannot avoid being in the sun, wear protective clothing and use sunscreen. Do not use sun lamps or tanning beds/booths. If a pet comes in contact with the area where this medicine was applied to your skin or if it is ingested, they may have a serious risk of side effects. If accidental contact happens, the skin of the pet should be washed right away with soap and water. Contact your vet right away if your pet becomes exposed. Do not become pregnant while taking this medicine. Women should inform their doctor if they wish to become pregnant or think they might be pregnant. There is a potential for serious side effects to an unborn child. Talk to your health care professional or pharmacist for more information. What side effects may I notice from receiving  this medicine? Side effects that you should report to your doctor or health care professional as soon as possible: -allergic reactions like skin rash, itching or hives, swelling of the face, lips, or tongue -black or bloody stools, blood in the urine or vomit -blurred vision -chest pain -difficulty breathing or wheezing -redness, blistering, peeling or loosening of the skin, including inside the mouth -severe redness and swelling of normal skin -slurred speech or weakness on one side of the body -trouble passing urine or change in the amount of urine -unexplained weight gain or swelling -unusually weak or tired -yellowing of eyes or skin Side effects that usually do not require medical attention (report to your doctor or health care professional if they continue or are bothersome): -increased sensitivity of the skin to sun and ultraviolet  light -pain and burning of the affected area -scaling or swelling of the affected area -skin rash, itching of the affected area -tenderness This list may not describe all possible side effects. Call your doctor for medical advice about side effects. You may report side effects to FDA at 1-800-FDA-1088. Where should I keep my medicine? Keep out of the reach of children and pets. Store at room temperature between 20 and 25 degrees C (68 and 77 degrees F). Throw away any unused medicine after the expiration date. NOTE: This sheet is a summary. It may not cover all possible information. If you have questions about this medicine, talk to your doctor, pharmacist, or health care provider.  2018 Elsevier/Gold Standard (2015-09-13 17:35:08)

## 2018-07-21 NOTE — Progress Notes (Signed)
47 year old male diagnosed with metastatic colon cancer with metastases to his liver. He is a patient of Dr. Irene Limbo.  Past medical history includes hyperlipidemia, diabetes, arthritis, and anemia.  Medications include Creon, Decadron, Humalog, Imodium, Ativan, Zofran, MiraLAX, Compazine, and senna.  Labs include glucose 175 on December 5.  Height: 6 feet 2 inches. Weight: 228.1 pounds. Usual body weight: 260 pounds in May. BMI: 29.29.  Patient reports he has had some nausea. He reports poor appetite.  He tries to eat 2 meals with a couple snacks daily. He has not really consumed very many oral nutrition supplements but he has tried [employer]. He reports he has diarrhea and constipation and is trying to manage both with medication. Patient reports his goal is to eat more, so he has more energy and can stay strong.  Nutrition diagnosis: Unintended weight loss related to metastatic colon cancer as evidenced by 12% weight loss over 7 months.  Intervention: Educated patient and wife on strategies for increasing appetite. Recommended increased calories and protein spread out between 3 meals and 3 snacks daily. Recommend patient choose Ensure Enlive or equivalent for additional calories and protein. Provided samples and coupons. Provided recipes for shakes that can be prepared for increased variety. Questions were answered.  Fact sheets were provided.  Contact information was given.  Monitoring, evaluation, goals: Patient will tolerate adequate calories and protein to improve quality of life.  Next visit: Thursday, January 2 during infusion.  **Disclaimer: This note was dictated with voice recognition software. Similar sounding words can inadvertently be transcribed and this note may contain transcription errors which may not have been corrected upon publication of note.**

## 2018-07-22 ENCOUNTER — Telehealth: Payer: Self-pay | Admitting: *Deleted

## 2018-07-22 ENCOUNTER — Other Ambulatory Visit: Payer: Self-pay | Admitting: Hematology

## 2018-07-22 DIAGNOSIS — M9905 Segmental and somatic dysfunction of pelvic region: Secondary | ICD-10-CM | POA: Diagnosis not present

## 2018-07-22 DIAGNOSIS — E7800 Pure hypercholesterolemia, unspecified: Secondary | ICD-10-CM | POA: Diagnosis not present

## 2018-07-22 DIAGNOSIS — G620 Drug-induced polyneuropathy: Secondary | ICD-10-CM | POA: Diagnosis not present

## 2018-07-22 DIAGNOSIS — M9902 Segmental and somatic dysfunction of thoracic region: Secondary | ICD-10-CM | POA: Diagnosis not present

## 2018-07-22 DIAGNOSIS — M9903 Segmental and somatic dysfunction of lumbar region: Secondary | ICD-10-CM | POA: Diagnosis not present

## 2018-07-22 DIAGNOSIS — E1169 Type 2 diabetes mellitus with other specified complication: Secondary | ICD-10-CM | POA: Diagnosis not present

## 2018-07-22 MED ORDER — FENTANYL 75 MCG/HR TD PT72: 75.0000 ug | MEDICATED_PATCH | TRANSDERMAL | 0 refills | Status: DC

## 2018-07-22 NOTE — Telephone Encounter (Signed)
Problem getting Fentanyl patch filled.Just filled a 50 mcg. MD added a 25 mcg  So that total dose is 75. Insurance won't approve it, so pharmacist recommended cancelling the 25 mcg and sending in RX for 75 mcg. Insurance will allow fill/refill in 5 days, so pharmacy will be able to fill. Dr. Irene Limbo cancelled 28mcg and ordered 8mcg. He recommended oxycododone for breakthrough pain. Spouse verbalized understanding.

## 2018-07-23 ENCOUNTER — Inpatient Hospital Stay: Payer: 59

## 2018-07-23 ENCOUNTER — Ambulatory Visit: Payer: 59

## 2018-07-23 VITALS — BP 113/69 | HR 87 | Temp 98.6°F | Resp 19

## 2018-07-23 DIAGNOSIS — C787 Secondary malignant neoplasm of liver and intrahepatic bile duct: Secondary | ICD-10-CM

## 2018-07-23 DIAGNOSIS — Z95828 Presence of other vascular implants and grafts: Secondary | ICD-10-CM

## 2018-07-23 DIAGNOSIS — C182 Malignant neoplasm of ascending colon: Secondary | ICD-10-CM | POA: Diagnosis not present

## 2018-07-23 DIAGNOSIS — C189 Malignant neoplasm of colon, unspecified: Secondary | ICD-10-CM

## 2018-07-23 DIAGNOSIS — Z7189 Other specified counseling: Secondary | ICD-10-CM

## 2018-07-23 DIAGNOSIS — D500 Iron deficiency anemia secondary to blood loss (chronic): Secondary | ICD-10-CM

## 2018-07-23 MED ORDER — HEPARIN SOD (PORK) LOCK FLUSH 100 UNIT/ML IV SOLN
500.0000 [IU] | Freq: Once | INTRAVENOUS | Status: AC | PRN
  Administered 2018-07-23: 500 [IU]
  Filled 2018-07-23: qty 5

## 2018-07-23 MED ORDER — SODIUM CHLORIDE 0.9% FLUSH
10.0000 mL | INTRAVENOUS | Status: DC | PRN
  Administered 2018-07-23: 10 mL
  Filled 2018-07-23: qty 10

## 2018-07-23 NOTE — Patient Instructions (Signed)
Implanted Port Home Guide An implanted port is a type of central line that is placed under the skin. Central lines are used to provide IV access when treatment or nutrition needs to be given through a person's veins. Implanted ports are used for long-term IV access. An implanted port may be placed because:  You need IV medicine that would be irritating to the small veins in your hands or arms.  You need long-term IV medicines, such as antibiotics.  You need IV nutrition for a long period.  You need frequent blood draws for lab tests.  You need dialysis.  Implanted ports are usually placed in the chest area, but they can also be placed in the upper arm, the abdomen, or the leg. An implanted port has two main parts:  Reservoir. The reservoir is round and will appear as a small, raised area under your skin. The reservoir is the part where a needle is inserted to give medicines or draw blood.  Catheter. The catheter is a thin, flexible tube that extends from the reservoir. The catheter is placed into a large vein. Medicine that is inserted into the reservoir goes into the catheter and then into the vein.  How will I care for my incision site? Do not get the incision site wet. Bathe or shower as directed by your health care provider. How is my port accessed? Special steps must be taken to access the port:  Before the port is accessed, a numbing cream can be placed on the skin. This helps numb the skin over the port site.  Your health care provider uses a sterile technique to access the port. ? Your health care provider must put on a mask and sterile gloves. ? The skin over your port is cleaned carefully with an antiseptic and allowed to dry. ? The port is gently pinched between sterile gloves, and a needle is inserted into the port.  Only "non-coring" port needles should be used to access the port. Once the port is accessed, a blood return should be checked. This helps ensure that the port  is in the vein and is not clogged.  If your port needs to remain accessed for a constant infusion, a clear (transparent) bandage will be placed over the needle site. The bandage and needle will need to be changed every week, or as directed by your health care provider.  Keep the bandage covering the needle clean and dry. Do not get it wet. Follow your health care provider's instructions on how to take a shower or bath while the port is accessed.  If your port does not need to stay accessed, no bandage is needed over the port.  What is flushing? Flushing helps keep the port from getting clogged. Follow your health care provider's instructions on how and when to flush the port. Ports are usually flushed with saline solution or a medicine called heparin. The need for flushing will depend on how the port is used.  If the port is used for intermittent medicines or blood draws, the port will need to be flushed: ? After medicines have been given. ? After blood has been drawn. ? As part of routine maintenance.  If a constant infusion is running, the port may not need to be flushed.  How long will my port stay implanted? The port can stay in for as long as your health care provider thinks it is needed. When it is time for the port to come out, surgery will be   done to remove it. The procedure is similar to the one performed when the port was put in. When should I seek immediate medical care? When you have an implanted port, you should seek immediate medical care if:  You notice a bad smell coming from the incision site.  You have swelling, redness, or drainage at the incision site.  You have more swelling or pain at the port site or the surrounding area.  You have a fever that is not controlled with medicine.  This information is not intended to replace advice given to you by your health care provider. Make sure you discuss any questions you have with your health care provider. Document  Released: 08/03/2005 Document Revised: 01/09/2016 Document Reviewed: 04/10/2013 Elsevier Interactive Patient Education  2017 [employer].  

## 2018-07-29 DIAGNOSIS — C182 Malignant neoplasm of ascending colon: Secondary | ICD-10-CM | POA: Diagnosis not present

## 2018-07-29 DIAGNOSIS — C189 Malignant neoplasm of colon, unspecified: Secondary | ICD-10-CM | POA: Diagnosis not present

## 2018-07-29 DIAGNOSIS — C786 Secondary malignant neoplasm of retroperitoneum and peritoneum: Secondary | ICD-10-CM | POA: Diagnosis not present

## 2018-07-29 DIAGNOSIS — C787 Secondary malignant neoplasm of liver and intrahepatic bile duct: Secondary | ICD-10-CM | POA: Diagnosis not present

## 2018-08-02 DIAGNOSIS — G5621 Lesion of ulnar nerve, right upper limb: Secondary | ICD-10-CM | POA: Diagnosis not present

## 2018-08-02 DIAGNOSIS — R200 Anesthesia of skin: Secondary | ICD-10-CM | POA: Diagnosis not present

## 2018-08-03 NOTE — Progress Notes (Signed)
Scott Gallagher    HEMATOLOGY/ONCOLOGY CLINIC NOTE  Date of Service: 08/04/18    Patient Care Team: [name], MD as PCP - General (Family Medicine) [name] M.D. 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 KRAS mutated BRAF, NRAS mutation neg MSI Stable.  PET/CT scan done on 06/25/2016 Show multiple foci of hypermetabolic hepatic metastases (atleast 4-5) and 2 hypermetabolic peritoneal nodules along the dorsal peritoneal surface concerning for peritoneal metastases.  MRI Liver 07/27/2016 - Multiple small liver metastases throughout the right and left hepatic lobes, largest measuring 2.1 cm. 1.2 cm enhancing peritoneal nodule in left paracolic gutter, suspicious for peritoneal metastasis. Other small peritoneal nodules better visualized on recent PET-CT which showed hypermetabolic activity, also suspicious for peritoneal metastases.   CT CHEST WO CONTRAST 10/12/2016  [facility] Result Impression   1. Congenital variant of bilateral middle lobes. 2. Groundglass opacification with regions of bronchiectasis in the right lower lobe adjacent to the fissure, consistent with inflammatory/infectious changes. 3. No definitive evidence of metastatic disease to the chest. 4. Probable sebaceous cyst in the subcutaneous tissues beneath the left anterior chest.   MR ABDOMEN AND PELVIS [place] FOLLOW UP2/26/2018 [facility] Result Impression    Decreased size of multiple hepatic lesions and two peritoneal nodules compared to outside MRI 07/27/2016. No new lesions identified in  the abdomen or pelvis.     PREVIOUS TREATMENT  FOLFOX x 10 cycles. 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 (without 5FU bolus - due to thrombocytopenia). S/p Y-90 treatment to 1 hepatic lobe.  Plan for prn hepatic Y90 radio-embolization   INTERVAL HISTORY:     Keveon Amsler is here for follow-up for his metastatic colon cancer and for C4 FOLFIRI. The patient's last visit with [place] was on 07/21/18. He is accompanied today by his wife. The pt reports that he is doing well overall.   The pt reports that he vomited a few times last night and has been constipated for the last 2-3 days which he notes came on "all of a sudden." He notes that he moved "a little bit" of his bowels last night, and is feeling bloated. He notes that his vomit was dark green. He took one Senna S over the last couple days, but denies taking this every day.   He notes that his pharmacy has had a difficult time with their wear house and he has therefore not been able to pick up his increased dose of 99mg/24hr Fentanyl patch. He notes that he is taking two 133mOxycodone every four hours.   Lab results today (08/04/18) of CBC w/diff and CMP is as follows: all values are WNL except for HGB at 11.6, HCT at 37.5, MCH at 25.2, RDW at 15.9, Lymphs abs at 500, Glucose at 125, Total Protein at 6.4.  On review of systems, pt reports constipation, recent vomiting, abdominal fullness, abdominal pain, and denies fevers, chills, night sweats, concerns for infections, and any other symptoms.    MEDICAL HISTORY:  Past Medical  History:  Diagnosis Date  . Anemia   . Arthritis   . Colon cancer ([place]) dx'd 05/2016  . Diabetes mellitus without complication (HCC)    diet controlled  . History of blood transfusion   . Hyperlipidemia   . liver mets dx'd 05/2016  . Sleep apnea    cpap  . Wears  glasses     SURGICAL HISTORY: Past Surgical History:  Procedure Laterality Date  . COLON SURGERY    . IR GENERIC HISTORICAL  09/24/2016   IR CV LINE INJECTION 09/24/2016 [facility]  . IR GENERIC HISTORICAL  09/24/2016   IR US GUIDE VASC ACCESS RIGHT 09/24/2016 [facility]  . IR GENERIC HISTORICAL  09/24/2016   IR FLUORO GUIDE CV LINE RIGHT 09/24/2016 [facility]  . IR GENERIC HISTORICAL  09/30/2016   IR FLUORO GUIDE PORT INSERTION RIGHT 09/30/2016 Markus Daft, MD [facility]  . IR GENERIC HISTORICAL  09/30/2016   IR US GUIDE VASC ACCESS RIGHT 09/30/2016 Markus Daft, MD [facility]  . IR GENERIC HISTORICAL  09/30/2016   IR REMOVAL TUN ACCESS W/ PORT W/O FL MOD SED 09/30/2016 Markus Daft, MD [facility]  . LAPAROSCOPIC RIGHT HEMI COLECTOMY Right 06/17/2016   Procedure: LAPAROSCOPIC ASSISTED  RIGHT HEMI COLECTOMY;  Surgeon: [name], MD;  Location: [facility];  Service: General;  Laterality: Right;  . PORTACATH PLACEMENT Left 07/13/2016   Procedure: INSERTION PORT-A-CATH left subclavian;  Surgeon: [name], MD;  Location: [facility];  Service: General;  Laterality: Left;  . SHOULDER ACROMIOPLASTY Right 12/20/2014   Procedure: SHOULDER ACROMIOPLASTY;  Surgeon: Melrose Nakayama, MD;  Location: [place];  Service: Orthopedics;  Laterality: Right;  . SHOULDER ARTHROSCOPY Right 12/20/2014   Procedure: RIGHT ARTHROSCOPY SHOULDER WITH DEBRIDEMENT;  Surgeon: Melrose Nakayama, MD;  Location: [place];  Service: Orthopedics;  Laterality: Right;  . TESTICLE SURGERY     as teen  . TONSILLECTOMY    . WISDOM TOOTH EXTRACTION      SOCIAL HISTORY: Social History   Socioeconomic History  . Marital status: Married    Spouse name: Not on file  . Number of children: Not on file  . Years of education: Not on file  . Highest education level: Not on file  Occupational History  . Occupation: cemetery maintenance  Social Needs  . Financial resource strain: Not on file  . Food  insecurity:    Worry: Not on file    Inability: Not on file  . Transportation needs:    Medical: Not on file    Non-medical: Not on file  Tobacco Use  . Smoking status: Former Smoker    Packs/day: 1.50    Years: 29.00    Pack years: 43.50    Types: Cigarettes    Last attempt to quit: 12/16/2012    Years since quitting: 5.6  . Smokeless tobacco: Never Used  . Tobacco comment: 1-2 ppd from age [age]y to 2014  Substance and Sexual Activity  . Alcohol use: Yes    Alcohol/week: 3.0 standard drinks    Types: 3 Shots of liquor per week    Comment:  some days  . Drug use: No  . Sexual activity: Not Currently    Birth control/protection: None  Lifestyle  . Physical activity:    Days per week: Not on file    Minutes per session: Not on file  . Stress: Not on file  Relationships  . Social connections:    Talks on phone: Not  on file    Gets together: Not on file    Attends religious service: Not on file    Active member of club or organization: Not on file    Attends meetings of clubs or organizations: Not on file    Relationship status: Not on file  . Intimate partner violence:    Fear of current or ex partner: Not on file    Emotionally abused: Not on file    Physically abused: Not on file    Forced sexual activity: Not on file  Other Topics Concern  . Not on file  Social History Narrative  . Not on file    FAMILY HISTORY: Family History  Problem Relation Age of Onset  . Diabetes Other   . Hyperlipidemia Other   . Hypertension Other   . Breast cancer Maternal Aunt 70  . Prostate cancer Maternal Uncle 75  . Lung cancer Paternal Uncle 50  . Stroke Maternal Grandfather 72  . Cancer Paternal Grandmother 41       dx cancer of pancreas and colon, unknown if separate primaries  . Heart Problems Paternal Grandfather        d. 45  . Prostate cancer Maternal Uncle 78  . Breast cancer Maternal Aunt 75  . Breast cancer Maternal Aunt 68  . Cervical cancer Maternal Aunt 68  .  Pancreatic cancer Maternal Aunt 54       d. 58y; heavy smoker  . Colon cancer Paternal Uncle 81       s/p partial colectomy; dx. second colon cancer at age 35-64    ALLERGIES:  is allergic to penicillins.  MEDICATIONS:  Current Outpatient Medications  Medication Sig Dispense Refill  . CREON 12000 units CPEP capsule TAKE ONE CAPSULE BY MOUTH THREE TIMES DAILY BEFORE MEALS (Patient not taking: Reported on 07/21/2018) 90 capsule 0  . dexamethasone (DECADRON) 4 MG tablet TAKE 2 TABLETS BY MOUTH WITH FOOD DAILY (START THE DAY AFTER CHEMOTHERAPY FOR 2 DAYS) (Patient taking differently: Take 8 mg by mouth as directed. Take 2 tabs for 2 Days After Chemo) 30 tablet 0  . dronabinol (MARINOL) 5 MG capsule Take 1 capsule (5 mg total) by mouth 2 (two) times daily before a meal. 60 capsule 0  . fentaNYL (DURAGESIC - DOSED MCG/HR) 75 MCG/HR Place 1 patch (75 mcg total) onto the skin every 3 (three) days. 10 patch 0  . Insulin Glargine (BASAGLAR KWIKPEN) 100 UNIT/ML SOPN Inject 70 Units into the skin every morning.     . insulin lispro (HUMALOG) 100 UNIT/ML injection Inject into the skin 3 (three) times daily before meals.    Scott Gallagher loperamide (IMODIUM A-D) 2 MG tablet Take 1-2 tablets (2-4 mg total) by mouth 4 (four) times daily as needed for diarrhea or loose stools. 100 tablet 1  . LORazepam (ATIVAN) 0.5 MG tablet Take 1-2 tablets (0.5-1 mg total) by mouth every 8 (eight) hours as needed for anxiety (nausea). 60 tablet 0  . Melatonin 3 MG TABS Take 6 tablets by mouth at bedtime as needed (sleep).   0  . ondansetron (ZOFRAN ODT) 4 MG disintegrating tablet Take 1 tablet (4 mg total) by mouth every 8 (eight) hours as needed for nausea or vomiting. 10 tablet 0  . ondansetron (ZOFRAN) 8 MG tablet Take 1 tablet (8 mg total) by mouth 2 (two) times daily as needed for refractory nausea / vomiting. Start on day 3 after chemotherapy. 30 tablet 1  . Oxycodone HCl 10 MG TABS  Take 1-2 tablets (10-20 mg total) by mouth every 4  (four) hours as needed. 120 tablet 0  . polyethylene glycol (MIRALAX) packet Take 17 g by mouth daily. (Patient taking differently: Take 17 g by mouth daily as needed for mild constipation. ) 30 each 1  . prochlorperazine (COMPAZINE) 10 MG tablet TAKE 1 TABLET BY MOUTH EVERY 6 HOURS AS NEEDED FOR NAUSEA / VOMITING 30 tablet 1  . senna-docusate (SENNA S) 8.6-50 MG tablet Take 2 tablets by mouth 2 (two) times daily. 120 tablet 2   No current facility-administered medications for this visit.     REVIEW OF SYSTEMS:    A 10+ POINT REVIEW OF SYSTEMS WAS OBTAINED including neurology, dermatology, psychiatry, cardiac, respiratory, lymph, extremities, GI, GU, Musculoskeletal, constitutional, breasts, reproductive, HEENT.  All pertinent positives are noted in the HPI.  All others are negative.   PHYSICAL EXAMINATION:  ECOG PERFORMANCE STATUS: 1 - Symptomatic but completely ambulatory  GENERAL:alert, in no acute distress and comfortable SKIN: no acute rashes, no significant lesions EYES: conjunctiva are pink and non-injected, sclera anicteric OROPHARYNX: MMM, no exudates, no oropharyngeal erythema or ulceration NECK: supple, no JVD LYMPH:  no palpable lymphadenopathy in the cervical, axillary or inguinal regions LUNGS: clear to auscultation b/l with normal respiratory effort HEART: regular rate & rhythm ABDOMEN:  normoactive bowel sounds, some mild tenderness to palpation in left central abdomen, not distended. No palpable hepatosplenomegaly. Healed surgical incision.  Extremity: no pedal edema PSYCH: alert & oriented x 3 with fluent speech NEURO: no focal motor/sensory deficits   LABORATORY DATA:  I have reviewed the data as listed  .Scott Gallagher CBC Latest Ref Rng & Units 08/04/2018 07/21/2018 07/07/2018  WBC 4.0 - 10.5 K/uL 5.5 4.9 6.1  Hemoglobin 13.0 - 17.0 g/dL 11.6(L) 11.0(L) 11.4(L)  Hematocrit 39.0 - 52.0 % 37.5(L) 35.2(L) 36.4(L)  Platelets 150 - 400 K/uL 219 163 195   . CMP Latest Ref Rng  & Units 08/04/2018 07/21/2018 07/07/2018  Glucose 70 - 99 mg/dL 125(H) 175(H) 128(H)  BUN 6 - 20 mg/dL 8 10 11   Creatinine 0.61 - 1.24 mg/dL 0.77 0.92 0.81  Sodium 135 - 145 mmol/L 141 139 140  Potassium 3.5 - 5.1 mmol/L 4.1 3.9 4.1  Chloride 98 - 111 mmol/L 103 103 105  CO2 22 - 32 mmol/L 27 27 27   Calcium 8.9 - 10.3 mg/dL 9.0 8.9 9.1  Total Protein 6.5 - 8.1 g/dL 6.4(L) 6.4(L) 6.6  Total Bilirubin 0.3 - 1.2 mg/dL 1.0 0.7 0.8  Alkaline Phos 38 - 126 U/L 111 117 117  AST 15 - 41 U/L 17 17 17   ALT 0 - 44 U/L 10 12 17    04/14/18 Surgical Pathology:   .    Microscopic Comment 2. COLON AND RECTUM (INCLUDING TRANS-ANAL RESECTION): Specimen: Terminal ileum, right colon and appendix Procedure: Segmental resection Tumor site: Proximal ascending colon Specimen integrity: Intact Macroscopic intactness of mesorectum: Not applicable: x Complete: NA Near complete: NA Incomplete: NA Cannot be determined (specify): NA Macroscopic tumor perforation: The mass invades through muscularis propria into pericolonic soft tissue Invasive tumor: Maximum size: 5.5 cm Histologic type(s): Adenocarcinoma Histologic grade and differentiation: G3 G1: well differentiated/low grade 1 of 4 Supplemental copy SUPPLEMENTAL for Wan, Isaid ([id number]) Microscopic Comment(continued) G2: moderately differentiated/low grade G3: poorly differentiated/high grade G4: undifferentiated/high grade Type of polyp in which invasive carcinoma arose: Tubular adenoma Microscopic extension of invasive tumor: The mass invade through the muscularis propria into pericolonic soft tissue Lymph-Vascular invasion: Identified Peri-neural invasion: Identified  Tumor deposit(s) (discontinuous extramural extension): Present Resection margins: Proximal margin: Negative Distal margin: Negative Circumferential (radial) (posterior ascending, posterior descending; lateral and posterior mid-rectum; and entire lower 1/3  rectum):Negative Mesenteric margin (sigmoid and transverse): NA Distance closest margin (if all above margins negative): 3.7 cm from the non peritonealized pericolic soft tissue margin Trans-anal resection margins only: Deep margin: NA Mucosal Margin: NA Distance closest mucosal margin (if negative): NA Treatment effect (neo-adjuvant therapy): NA Additional polyp(s): Negative Non-neoplastic findings: Unremarkable Lymph nodes: number examined 18; number positive: 5 Pathologic Staging: pT3, N2a, M1a Ancillary studies: MSI ordered      RADIOGRAPHIC STUDIES: I have personally reviewed the radiological images as listed and agreed with the findings in the report. CT abd/pelvis 12/09/2017: IMPRESSION: 1. Heterogeneous hepatic steatosis. Given this mild limitation, no evidence of progressive hepatic metastasis. Similar nonspecific capsular irregularity along the inferior right hepatic lobe. 2. Suspect developing omental/peritoneal metastasis, as evidenced by new and increased omental nodularity. Trace cul-de-sac fluid is nonspecific but could also be secondary. 3.  No acute process or evidence of metastatic disease in the chest. 4. Age advanced coronary artery atherosclerosis. Recommend assessment of coronary risk factors and consideration of medical therapy. 5.  Aortic Atherosclerosis (ICD10-I70.0). 6. Possible developing cirrhosis.  Correlate with risk factors.   Electronically Signed   By: Abigail Miyamoto M.D.   On: 12/09/2017 14:23    MR LIVER WWO CONTRAST2/14/2019 [facility] Result Impression   1.Decreased sizes of multiple T2 hyperintense metastatic lesions (in the setting of patient's known colon adenocarcinoma with mucinous features) in both hepatic lobes (the majority of which are tiny). 2.No new lesions. 3.Posttreatment changes within the right hepatic lobe. 4.Other findings, as detailed above.  Result Narrative  MR LIVER WITH AND  WITHOUT CONTRAST, 09/30/2017 10:20 AM  INDICATION: liver malignancy \ liver malignancy \ C18.9 Metastatic colon cancer to liver (HCC) \ C78.7 Metastatic colon cancer to liver ([place])  ADDITIONAL HISTORY: None. COMPARISON: MRI abdomen dated 07/01/2017.  TECHNIQUE: Multiplanar, multisequence MR images of the upper abdomen were obtained before and after intravenous administration of gadolinium-based contrast.   FINDINGS:  LOWER CHEST Heart: Normal size. No pericardial effusion. Lungs/pleura: No masses, consolidations, or effusions.  ABDOMEN Liver: Changes of prior radioembolization in the right hepatic lobe. Perfusional anomaly in the posterior right hepatic lobe, likely a venovenous shunt. Decreased sizes of multiple T2 hyperintense metastatic lesions throughout both hepatic lobes (the majority of which are tiny and are marked in PACS on series 5), with the dominant lesion measuring 10 mm in segment 8 (series 5, image 19), versus 12 mm previously. No new lesions. Gallbladder: Normal. No stones or inflammatory changes. Biliary: No obstruction. Spleen: Splenomegaly, with the spleen measuring 15.2 cm in span. Pancreas: Normal. Adrenals: Normal. Kidneys: No masses, stones, or obstruction. Unchanged fluid signal lesions in both kidneys, measuring up to 2.0 cm in the interpolar region of the left kidney. Stomach/bowel: Unremarkable.    07/29/18 MRI Liver:    ASSESSMENT & PLAN:   47 y.o. 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 KRAS mutated BRAF, NRAS mutation neg MSI Stable.  PET/CT scan done on 06/25/2016 Show multiple foci of hypermetabolic hepatic metastases (atleast 4-5) and 2 hypermetabolic peritoneal nodules along the dorsal peritoneal surface concerning for peritoneal metastases.  MRI Liver 07/27/2016 - Multiple small  liver metastases throughout the right and left  hepatic lobes, largest measuring 2.1 cm. 1.2 cm enhancing peritoneal nodule in left paracolic gutter, suspicious for peritoneal metastasis. Other small peritoneal nodules better visualized on recent PET-CT which showed hypermetabolic activity, also suspicious for peritoneal metastases.   [facility]12[facility] --shows improvement in all the liver and the 2 peritoneal lesions and no new lesions.  MRI liver and CT C/A/P on 01/01/2017 as noted above showed improved/stable disease.   MRI Liver 07/01/2017 at [facility] -  1.Three T2 hyperintense metastatic lesions are seen in the liver, with the largest again in segment 8. These appear unchanged from prior, again without appreciable enhancement or restricted diffusion. Several of the previously noted tiny lesions are no longer visualized. No new lesions identified.  2.Treatment related changes in the posterior right hepatic lobe. 3.A peritoneal nodule adjacent to the splenic flexure is now more conspicuous compared to the prior study but appears similar to more remote imaging and again could relate to neoplastic disease, although is indeterminate.  CT Abd/pelvis 08/23/2017: Interval resolution of previously seen small right hepatic lobe lesion. Stable tiny sub-cm nodule along the capsular surface of the right hepatic lobe. No new or progressive disease identified. Stable hepatic steatosis with areas of fatty sparing.  MRI 09/30/17:  Decreased sizes of multiple T2 hyperintense metastatic lesions (in the setting of patient's known colon adenocarcinoma with mucinous features) in both hepatic lobes (the majority of which are tiny). No new lesions. Posttreatment changes within the right hepatic lobe. Other findings, as detailed above.   12/09/17 CT C/A/P with pt which revealed Suspected developing omental/peritoneal metastasis, as evidenced by new and increased omental nodularity.  -CEA levels are slowing  increasing and on 4/419 were increased to 11.87 and today are up to 27.26  02/10/18 MRI Liver Post treatment changes in the posterior right lobe. No significant change in the peripherally enhancing cystic lesion in segment 6 and another subcentimeter punctate T2 hyperintensity is identified in segment 7. The remaining previously reported lesions are not seen on today's exam. Continued attention on follow-up imaging is advised. 2.Interval development of multiple new peritoneal implants in the right anterior abdomen as detailed above and interval increase in size of the implant adjacent to the splenic flexure  02/21/18 CT C/A/P revealed Multiple scattered peritoneal implants in the upper to mid abdomen are similar compared to 02/10/2018.2.Additional tiny soft tissue implants in the lower abdomen and pelvis are technically new compared to 01/01/2017. Advise continued attention on follow-up.   05/08/18 CT A/P revealed Coronary arteriosclerosis. 2. Steatosis of the liver with indeterminate areas of hypodensity within the right hepatic lobe possibly representing post treatment change. New subtle lesions are not entirely excluded. Short-term interval follow-up and attention to these areas is recommended. MRI may also prove useful for better assessment with IV contrast. 3. Left upper pole parapelvic cyst, stable in appearance measuring approximately 2 cm in diameter. No worrisome features. 4. Small mesenteric nodules consistent with mildly enlarged mesenteric lymph nodes or peritoneal metastasis are noted in the right hemiabdomen and ventral upper abdomen. These are not definitively identified on prior exam and suspect these are new or have enlarged in the interim. 5. Mechanical bowel obstruction or inflammation. 6. New ventral midline surgical scarring post surgical change suspected in the mesentery.   06/12/18 CT A/P revealed Increased size and number of liver metastases. 2. Gallbladder is contracted and has a  thickened wall, suspicious for acute cholecystitis. Unchanged appearance of biliary stent. 3. Recommend further evaluation with RIGHT UPPER QUADRANT ultrasound evaluated  gallbladder. 4. Splenomegaly. 5. Increased mesenteric nodules, consistent with metastatic disease. 6. Small, irregular collections of mesenteric and pelvic fluid, suspicious for metastatic disease. 7.  Aortic atherosclerosis.   2) Mild thrombocytopenia- Resolved . PLT at 163k on 07/21/18.   3) Cancer related pain  PLAN:   -Discussed pt labwork today, 08/04/18; blood counts and chemistries are stable  -Reviewed the 07/29/18 MRI Liver which did not reveal anything new from the last CT A/P from 06/12/18 -One time use of Magnesium Citrate OTC  -Increase to two tablets of Senna S BID  -Abdominal XR to rule out obstruction - no obstruction -- large amount of stools in the colon. -Will proceed with C4 FOLFIRI scheduled for today as the pt was able to achieve a small bowel movement last night, and would not like to delay treatment, which the pt is in agreement with -Refilling Compazine, Lorazepam and Oxycodone today  -07/21/18 CEA is downtrending. - Added Pepcid and Benadryl to pre-medications to help with the flushing -Aloxi has been added on for third day following infusion to better control delayed nausea. -Did increase to 48mg/hr Fentanyl patch  -Did increase prn dose of Oxycodone to 158m -Marinol for appetite stimulation, which he prefers -Continue Zofran, Compazine and Ativan for nausea control -Did refer the pt to palliative care-outpatient. -Did refer the pt to behavioral health as requested -Recommended that the pt take Creon and Gas-X -Will see the pt back with C5 FOLFIRI   4) Iron deficiency anemia due to GI bleeding from the tumor and some blood loss with surgery. -resolved   #5  Patient Active Problem List   Diagnosis Date Noted  . Counseling regarding advanced care planning and goals of care 05/27/2017  .  Chemotherapy-induced neuropathy ([facility]04/11/2016  . Genetic testing 10/30/2016  . Port catheter in place 09/25/2016  . Family history of colon cancer 07/24/2016  . Family history of prostate cancer 07/24/2016  . Metastatic colon cancer to liver ([facility]11/15/2017  . Right Colon cancer metastasized to liver ([facility]11/08/2015  . Chronic GI bleeding 05/29/2016  . Diabetes mellitus type 2, diet-controlled ([facility]10/01/2016  . OSA (obstructive sleep apnea) 05/22/2016  . Hyperlipidemia 05/22/2016  . Anemia due to blood loss, chronic   . Antineoplastic chemotherapy induced pancytopenia (CODE) ([facility]10/12/2015   Plan:  -Continue follow-up with primary care physician SwAntony ContrasMD for optimization of diabetes management - now on basal insulin with improving control.  -Continue use of CPAP for sleep apnea.   -Please scheduled next 2 cycles of FOLFIRI as ordered with labs and MD visits  -Abdominal X ray today     All of the patients questions were answered with apparent satisfaction. The patient knows to call the clinic with any problems, questions or concerns.  The total time spent in the appt was 25 minutes and more than 50% was on counseling and direct patient cares.   GaSullivan LoneD MSDorchesterAHIVMS SCHauser Ross Ambulatory Surgical CenterTNatchitoches Regional Medical Centerematology/Oncology Physician CoChristus Santa Rosa Hospital - [facility](Office):       [phone] cell):  [phone]):           [phone], ScBaldwin Jamaicaam acting as a scribe for Dr. GaSullivan Lone  .I have reviewed the above documentation for accuracy and completeness, and I agree with the above. .GBrunetta GeneraD

## 2018-08-03 NOTE — Progress Notes (Signed)
[place] [facility] Counseling Session  The patient presented to session with a calm mood and matching affect. The patient reported "not feeling well" today and requested to end session early. The patient and counselor met for 15 minutes.  The patient reported that he has not experiencing any "breakdowns," or sudden crying, in the last 2 weeks. The patient expressed that this brought him relief, reporting that "it's hard to be my wife's hero when I am breaking down."   The patient and counselor briefly processed the patient's discomfort with his wife shifting into a caregiving role. The patient identified gender roles as the source of his discomfort. The patient then reported he was feeling physically unwell, so the patient and counselor ended session. The patient and counselor will meet again on January 2 at 2 pm.  Doris Cheadle, Counseling Intern

## 2018-08-04 ENCOUNTER — Inpatient Hospital Stay: Payer: 59

## 2018-08-04 ENCOUNTER — Inpatient Hospital Stay (HOSPITAL_BASED_OUTPATIENT_CLINIC_OR_DEPARTMENT_OTHER): Payer: 59 | Admitting: Hematology

## 2018-08-04 ENCOUNTER — Ambulatory Visit (HOSPITAL_COMMUNITY)
Admission: RE | Admit: 2018-08-04 | Discharge: 2018-08-04 | Disposition: A | Discharge status: Home / Self Care | Payer: 59 | Source: Ambulatory Visit | Attending: Hematology | Admitting: Hematology

## 2018-08-04 VITALS — BP 136/78 | HR 83 | Temp 98.7°F | Resp 18 | Wt 221.2 lb

## 2018-08-04 DIAGNOSIS — R161 Splenomegaly, not elsewhere classified: Secondary | ICD-10-CM

## 2018-08-04 DIAGNOSIS — G893 Neoplasm related pain (acute) (chronic): Secondary | ICD-10-CM | POA: Diagnosis not present

## 2018-08-04 DIAGNOSIS — K5903 Drug induced constipation: Secondary | ICD-10-CM

## 2018-08-04 DIAGNOSIS — C787 Secondary malignant neoplasm of liver and intrahepatic bile duct: Secondary | ICD-10-CM

## 2018-08-04 DIAGNOSIS — Z803 Family history of malignant neoplasm of breast: Secondary | ICD-10-CM

## 2018-08-04 DIAGNOSIS — Z801 Family history of malignant neoplasm of trachea, bronchus and lung: Secondary | ICD-10-CM

## 2018-08-04 DIAGNOSIS — D500 Iron deficiency anemia secondary to blood loss (chronic): Secondary | ICD-10-CM

## 2018-08-04 DIAGNOSIS — C182 Malignant neoplasm of ascending colon: Secondary | ICD-10-CM

## 2018-08-04 DIAGNOSIS — T451X5S Adverse effect of antineoplastic and immunosuppressive drugs, sequela: Secondary | ICD-10-CM

## 2018-08-04 DIAGNOSIS — C189 Malignant neoplasm of colon, unspecified: Secondary | ICD-10-CM

## 2018-08-04 DIAGNOSIS — Z794 Long term (current) use of insulin: Secondary | ICD-10-CM

## 2018-08-04 DIAGNOSIS — I700 Atherosclerosis of aorta: Secondary | ICD-10-CM

## 2018-08-04 DIAGNOSIS — Z9049 Acquired absence of other specified parts of digestive tract: Secondary | ICD-10-CM | POA: Diagnosis not present

## 2018-08-04 DIAGNOSIS — I2510 Atherosclerotic heart disease of native coronary artery without angina pectoris: Secondary | ICD-10-CM

## 2018-08-04 DIAGNOSIS — R112 Nausea with vomiting, unspecified: Secondary | ICD-10-CM | POA: Diagnosis not present

## 2018-08-04 DIAGNOSIS — Z95828 Presence of other vascular implants and grafts: Secondary | ICD-10-CM

## 2018-08-04 DIAGNOSIS — D61810 Antineoplastic chemotherapy induced pancytopenia: Secondary | ICD-10-CM

## 2018-08-04 DIAGNOSIS — R110 Nausea: Secondary | ICD-10-CM

## 2018-08-04 DIAGNOSIS — Z79899 Other long term (current) drug therapy: Secondary | ICD-10-CM

## 2018-08-04 DIAGNOSIS — M1990 Unspecified osteoarthritis, unspecified site: Secondary | ICD-10-CM

## 2018-08-04 DIAGNOSIS — Z7189 Other specified counseling: Secondary | ICD-10-CM

## 2018-08-04 DIAGNOSIS — K760 Fatty (change of) liver, not elsewhere classified: Secondary | ICD-10-CM

## 2018-08-04 DIAGNOSIS — C786 Secondary malignant neoplasm of retroperitoneum and peritoneum: Secondary | ICD-10-CM | POA: Insufficient documentation

## 2018-08-04 DIAGNOSIS — E119 Type 2 diabetes mellitus without complications: Secondary | ICD-10-CM

## 2018-08-04 DIAGNOSIS — E785 Hyperlipidemia, unspecified: Secondary | ICD-10-CM

## 2018-08-04 DIAGNOSIS — Z8042 Family history of malignant neoplasm of prostate: Secondary | ICD-10-CM

## 2018-08-04 DIAGNOSIS — Z87891 Personal history of nicotine dependence: Secondary | ICD-10-CM

## 2018-08-04 DIAGNOSIS — R630 Anorexia: Secondary | ICD-10-CM

## 2018-08-04 LAB — CBC WITH DIFFERENTIAL (CANCER CENTER ONLY)
Abs Immature Granulocytes: 0.02 10*3/uL (ref 0.00–0.07)
BASOS ABS: 0 10*3/uL (ref 0.0–0.1)
Basophils Relative: 1 %
Eosinophils Absolute: 0.1 10*3/uL (ref 0.0–0.5)
Eosinophils Relative: 1 %
HCT: 37.5 % — ABNORMAL LOW (ref 39.0–52.0)
Hemoglobin: 11.6 g/dL — ABNORMAL LOW (ref 13.0–17.0)
IMMATURE GRANULOCYTES: 0 %
Lymphocytes Relative: 9 %
Lymphs Abs: 0.5 10*3/uL — ABNORMAL LOW (ref 0.7–4.0)
MCH: 25.2 pg — ABNORMAL LOW (ref 26.0–34.0)
MCHC: 30.9 g/dL (ref 30.0–36.0)
MCV: 81.3 fL (ref 80.0–100.0)
Monocytes Absolute: 0.5 10*3/uL (ref 0.1–1.0)
Monocytes Relative: 9 %
NEUTROS PCT: 80 %
Neutro Abs: 4.4 10*3/uL (ref 1.7–7.7)
PLATELETS: 219 10*3/uL (ref 150–400)
RBC: 4.61 MIL/uL (ref 4.22–5.81)
RDW: 15.9 % — ABNORMAL HIGH (ref 11.5–15.5)
WBC: 5.5 10*3/uL (ref 4.0–10.5)
nRBC: 0 % (ref 0.0–0.2)

## 2018-08-04 LAB — CMP (CANCER CENTER ONLY)
ALT: 10 U/L (ref 0–44)
ANION GAP: 11 (ref 5–15)
AST: 17 U/L (ref 15–41)
Albumin: 3.5 g/dL (ref 3.5–5.0)
Alkaline Phosphatase: 111 U/L (ref 38–126)
BUN: 8 mg/dL (ref 6–20)
CO2: 27 mmol/L (ref 22–32)
Calcium: 9 mg/dL (ref 8.9–10.3)
Chloride: 103 mmol/L (ref 98–111)
Creatinine: 0.77 mg/dL (ref 0.61–1.24)
GFR, Est AFR Am: >60 mL/min (ref >60)
GFR, Estimated: >60 mL/min (ref >60)
Glucose, Bld: 125 mg/dL — ABNORMAL HIGH (ref 70–99)
Potassium: 4.1 mmol/L (ref 3.5–5.1)
Sodium: 141 mmol/L (ref 135–145)
Total Bilirubin: 1 mg/dL (ref 0.3–1.2)
Total Protein: 6.4 g/dL — ABNORMAL LOW (ref 6.5–8.1)

## 2018-08-04 MED ORDER — ATROPINE SULFATE 1 MG/ML IJ SOLN
0.5000 mg | Freq: Once | INTRAMUSCULAR | Status: AC | PRN
  Administered 2018-08-04: 0.5 mg via INTRAVENOUS

## 2018-08-04 MED ORDER — DIPHENHYDRAMINE HCL 50 MG/ML IJ SOLN
INTRAMUSCULAR | Status: AC
  Filled 2018-08-04: qty 1

## 2018-08-04 MED ORDER — FAMOTIDINE IN NACL 20-0.9 MG/50ML-% IV SOLN
INTRAVENOUS | Status: AC
  Filled 2018-08-04: qty 50

## 2018-08-04 MED ORDER — PALONOSETRON HCL INJECTION 0.25 MG/5ML
INTRAVENOUS | Status: AC
  Filled 2018-08-04: qty 5

## 2018-08-04 MED ORDER — OXYCODONE HCL 10 MG PO TABS: 10.0000 mg | ORAL_TABLET | ORAL | 0 refills | Status: DC | PRN

## 2018-08-04 MED ORDER — SODIUM CHLORIDE 0.9 % IV SOLN
2420.0000 mg/m2 | INTRAVENOUS | Status: DC
  Administered 2018-08-04: 5800 mg via INTRAVENOUS
  Filled 2018-08-04: qty 116

## 2018-08-04 MED ORDER — PALONOSETRON HCL INJECTION 0.25 MG/5ML
0.2500 mg | Freq: Once | INTRAVENOUS | Status: AC
  Administered 2018-08-04: 0.25 mg via INTRAVENOUS

## 2018-08-04 MED ORDER — SODIUM CHLORIDE 0.9 % IV SOLN
Freq: Once | INTRAVENOUS | Status: AC
  Administered 2018-08-04: 10:00:00 via INTRAVENOUS
  Filled 2018-08-04: qty 250

## 2018-08-04 MED ORDER — FLUOROURACIL CHEMO INJECTION 2.5 GM/50ML
400.0000 mg/m2 | Freq: Once | INTRAVENOUS | Status: AC
  Administered 2018-08-04: 950 mg via INTRAVENOUS
  Filled 2018-08-04: qty 19

## 2018-08-04 MED ORDER — SODIUM CHLORIDE 0.9% FLUSH
10.0000 mL | INTRAVENOUS | Status: DC | PRN
  Administered 2018-08-04: 10 mL via INTRAVENOUS
  Filled 2018-08-04: qty 10

## 2018-08-04 MED ORDER — SENNOSIDES-DOCUSATE SODIUM 8.6-50 MG PO TABS: 2.0000 | ORAL_TABLET | Freq: Two times a day (BID) | ORAL | 2 refills | Status: DC

## 2018-08-04 MED ORDER — SODIUM CHLORIDE 0.9 % IV SOLN
180.0000 mg/m2 | Freq: Once | INTRAVENOUS | Status: AC
  Administered 2018-08-04: 440 mg via INTRAVENOUS
  Filled 2018-08-04: qty 15

## 2018-08-04 MED ORDER — DIPHENHYDRAMINE HCL 50 MG/ML IJ SOLN
25.0000 mg | Freq: Once | INTRAMUSCULAR | Status: AC
  Administered 2018-08-04: 25 mg via INTRAVENOUS

## 2018-08-04 MED ORDER — LOPERAMIDE HCL 2 MG PO TABS: 2.0000 mg | ORAL_TABLET | Freq: Once | ORAL | Status: DC

## 2018-08-04 MED ORDER — SODIUM CHLORIDE 0.9 % IV SOLN
400.0000 mg/m2 | Freq: Once | INTRAVENOUS | Status: AC
  Administered 2018-08-04: 956 mg via INTRAVENOUS
  Filled 2018-08-04: qty 47.8

## 2018-08-04 MED ORDER — FAMOTIDINE IN NACL 20-0.9 MG/50ML-% IV SOLN
20.0000 mg | Freq: Once | INTRAVENOUS | Status: AC
  Administered 2018-08-04: 20 mg via INTRAVENOUS

## 2018-08-04 MED ORDER — ATROPINE SULFATE 1 MG/ML IJ SOLN
INTRAMUSCULAR | Status: AC
  Filled 2018-08-04: qty 1

## 2018-08-04 MED ORDER — SODIUM CHLORIDE 0.9 % IV SOLN
Freq: Once | INTRAVENOUS | Status: AC
  Administered 2018-08-04: 10:00:00 via INTRAVENOUS
  Filled 2018-08-04: qty 5

## 2018-08-04 MED ORDER — LORAZEPAM 0.5 MG PO TABS: 0.5000 mg | ORAL_TABLET | Freq: Three times a day (TID) | ORAL | 0 refills | Status: DC | PRN

## 2018-08-04 MED ORDER — PROCHLORPERAZINE MALEATE 10 MG PO TABS: ORAL_TABLET | ORAL | 1 refills | Status: DC

## 2018-08-04 NOTE — Patient Instructions (Signed)
[facility] Discharge Instructions for Patients Receiving Chemotherapy  Today you received the following chemotherapy agents Irinotecan, Leucovorin, 5FU.  To help prevent nausea and vomiting after your treatment, we encourage you to take your nausea medication.   If you develop nausea and vomiting that is not controlled by your nausea medication, call the clinic.   BELOW ARE SYMPTOMS THAT SHOULD BE REPORTED IMMEDIATELY:  *FEVER GREATER THAN 100.5 F  *CHILLS WITH OR WITHOUT FEVER  NAUSEA AND VOMITING THAT IS NOT CONTROLLED WITH YOUR NAUSEA MEDICATION  *UNUSUAL SHORTNESS OF BREATH  *UNUSUAL BRUISING OR BLEEDING  TENDERNESS IN MOUTH AND THROAT WITH OR WITHOUT PRESENCE OF ULCERS  *URINARY PROBLEMS  *BOWEL PROBLEMS  UNUSUAL RASH Items with * indicate a potential emergency and should be followed up as soon as possible.  Feel free to call the clinic should you have any questions or concerns. The clinic phone number is [phone].  Please show the [place] at check-in to the Emergency Department and triage nurse.

## 2018-08-05 ENCOUNTER — Telehealth: Payer: Self-pay | Admitting: *Deleted

## 2018-08-05 NOTE — Telephone Encounter (Signed)
[facility]ed [facility] - Fent[facility] patches on back order. [facility] [address] [facility] [place] [facility] Phone [phone]. Contacted Dr. Irene Limbo, message given with pharmacy #.

## 2018-08-05 NOTE — Telephone Encounter (Signed)
Spouse called. No BM since Saturday. Took half bottle Mg Citrate yesterday and second half today. Started on Senna S. Please advise. Per Dr. Irene Limbo: Repeat Mg Citrate 24 hours after last dose with full bottle. Can also try Fleets Enema (OTC) x 2. Discussed manual removal of stool with spouse, but stated all other methods should be attempted before considering. Spouse verbalized understanding.

## 2018-08-06 ENCOUNTER — Inpatient Hospital Stay: Payer: 59

## 2018-08-06 VITALS — BP 118/70 | HR 101 | Temp 98.0°F | Resp 18 | Ht 74.0 in

## 2018-08-06 DIAGNOSIS — Z7189 Other specified counseling: Secondary | ICD-10-CM

## 2018-08-06 DIAGNOSIS — C182 Malignant neoplasm of ascending colon: Secondary | ICD-10-CM | POA: Diagnosis not present

## 2018-08-06 DIAGNOSIS — C787 Secondary malignant neoplasm of liver and intrahepatic bile duct: Secondary | ICD-10-CM

## 2018-08-06 DIAGNOSIS — C189 Malignant neoplasm of colon, unspecified: Secondary | ICD-10-CM

## 2018-08-06 MED ORDER — HEPARIN SOD (PORK) LOCK FLUSH 100 UNIT/ML IV SOLN
500.0000 [IU] | Freq: Once | INTRAVENOUS | Status: AC | PRN
  Administered 2018-08-06: 500 [IU]
  Filled 2018-08-06: qty 5

## 2018-08-06 MED ORDER — PALONOSETRON HCL INJECTION 0.25 MG/5ML
0.2500 mg | Freq: Once | INTRAVENOUS | Status: AC
  Administered 2018-08-06: 0.25 mg via INTRAVENOUS

## 2018-08-06 MED ORDER — SODIUM CHLORIDE 0.9% FLUSH
10.0000 mL | INTRAVENOUS | Status: DC | PRN
  Administered 2018-08-06: 10 mL
  Filled 2018-08-06: qty 10

## 2018-08-06 MED ORDER — PALONOSETRON HCL INJECTION 0.25 MG/5ML
INTRAVENOUS | Status: AC
  Filled 2018-08-06: qty 5

## 2018-08-08 ENCOUNTER — Other Ambulatory Visit: Payer: Self-pay | Admitting: Hematology

## 2018-08-08 MED ORDER — FENTANYL 75 MCG/HR TD PT72: 75.0000 ug | MEDICATED_PATCH | TRANSDERMAL | 0 refills | Status: DC

## 2018-08-17 ENCOUNTER — Other Ambulatory Visit: Payer: Self-pay

## 2018-08-17 ENCOUNTER — Observation Stay (HOSPITAL_COMMUNITY)
Admission: EM | Admit: 2018-08-17 | Discharge: 2018-08-20 | Disposition: A | Discharge status: Home / Self Care | Payer: 59 | Attending: Internal Medicine | Admitting: Internal Medicine

## 2018-08-17 ENCOUNTER — Emergency Department (HOSPITAL_COMMUNITY): Payer: 59

## 2018-08-17 DIAGNOSIS — C786 Secondary malignant neoplasm of retroperitoneum and peritoneum: Secondary | ICD-10-CM | POA: Diagnosis not present

## 2018-08-17 DIAGNOSIS — E119 Type 2 diabetes mellitus without complications: Secondary | ICD-10-CM | POA: Diagnosis not present

## 2018-08-17 DIAGNOSIS — E785 Hyperlipidemia, unspecified: Secondary | ICD-10-CM | POA: Diagnosis not present

## 2018-08-17 DIAGNOSIS — R112 Nausea with vomiting, unspecified: Secondary | ICD-10-CM

## 2018-08-17 DIAGNOSIS — Z79899 Other long term (current) drug therapy: Secondary | ICD-10-CM | POA: Insufficient documentation

## 2018-08-17 DIAGNOSIS — R627 Adult failure to thrive: Secondary | ICD-10-CM | POA: Diagnosis not present

## 2018-08-17 DIAGNOSIS — Z85038 Personal history of other malignant neoplasm of large intestine: Secondary | ICD-10-CM | POA: Insufficient documentation

## 2018-08-17 DIAGNOSIS — R188 Other ascites: Secondary | ICD-10-CM | POA: Insufficient documentation

## 2018-08-17 DIAGNOSIS — G4733 Obstructive sleep apnea (adult) (pediatric): Secondary | ICD-10-CM | POA: Insufficient documentation

## 2018-08-17 DIAGNOSIS — D630 Anemia in neoplastic disease: Secondary | ICD-10-CM | POA: Diagnosis not present

## 2018-08-17 DIAGNOSIS — K5903 Drug induced constipation: Secondary | ICD-10-CM | POA: Insufficient documentation

## 2018-08-17 DIAGNOSIS — M1990 Unspecified osteoarthritis, unspecified site: Secondary | ICD-10-CM | POA: Diagnosis not present

## 2018-08-17 DIAGNOSIS — Z9221 Personal history of antineoplastic chemotherapy: Secondary | ICD-10-CM | POA: Diagnosis not present

## 2018-08-17 DIAGNOSIS — C787 Secondary malignant neoplasm of liver and intrahepatic bile duct: Secondary | ICD-10-CM | POA: Diagnosis not present

## 2018-08-17 DIAGNOSIS — R109 Unspecified abdominal pain: Secondary | ICD-10-CM | POA: Diagnosis not present

## 2018-08-17 DIAGNOSIS — I700 Atherosclerosis of aorta: Secondary | ICD-10-CM | POA: Insufficient documentation

## 2018-08-17 DIAGNOSIS — G4700 Insomnia, unspecified: Secondary | ICD-10-CM | POA: Insufficient documentation

## 2018-08-17 DIAGNOSIS — R000 Tachycardia, unspecified: Secondary | ICD-10-CM | POA: Diagnosis not present

## 2018-08-17 DIAGNOSIS — N281 Cyst of kidney, acquired: Secondary | ICD-10-CM | POA: Diagnosis not present

## 2018-08-17 DIAGNOSIS — J181 Lobar pneumonia, unspecified organism: Secondary | ICD-10-CM

## 2018-08-17 DIAGNOSIS — C189 Malignant neoplasm of colon, unspecified: Secondary | ICD-10-CM | POA: Diagnosis not present

## 2018-08-17 DIAGNOSIS — J189 Pneumonia, unspecified organism: Secondary | ICD-10-CM

## 2018-08-17 DIAGNOSIS — F419 Anxiety disorder, unspecified: Secondary | ICD-10-CM | POA: Diagnosis not present

## 2018-08-17 DIAGNOSIS — D649 Anemia, unspecified: Secondary | ICD-10-CM

## 2018-08-17 DIAGNOSIS — J690 Pneumonitis due to inhalation of food and vomit: Secondary | ICD-10-CM | POA: Diagnosis not present

## 2018-08-17 DIAGNOSIS — N200 Calculus of kidney: Secondary | ICD-10-CM | POA: Diagnosis not present

## 2018-08-17 DIAGNOSIS — Z9049 Acquired absence of other specified parts of digestive tract: Secondary | ICD-10-CM | POA: Diagnosis not present

## 2018-08-17 DIAGNOSIS — R5381 Other malaise: Secondary | ICD-10-CM

## 2018-08-17 DIAGNOSIS — Z87891 Personal history of nicotine dependence: Secondary | ICD-10-CM | POA: Diagnosis not present

## 2018-08-17 DIAGNOSIS — A419 Sepsis, unspecified organism: Secondary | ICD-10-CM | POA: Diagnosis not present

## 2018-08-17 LAB — CBC WITH DIFFERENTIAL/PLATELET
Abs Immature Granulocytes: 0.05 10*3/uL (ref 0.00–0.07)
Basophils Absolute: 0.1 10*3/uL (ref 0.0–0.1)
Basophils Relative: 1 %
Eosinophils Absolute: 0.1 10*3/uL (ref 0.0–0.5)
Eosinophils Relative: 1 %
HCT: 38.4 % — ABNORMAL LOW (ref 39.0–52.0)
Hemoglobin: 11.5 g/dL — ABNORMAL LOW (ref 13.0–17.0)
Immature Granulocytes: 0 %
LYMPHS ABS: 0.7 10*3/uL (ref 0.7–4.0)
Lymphocytes Relative: 6 %
MCH: 25.4 pg — ABNORMAL LOW (ref 26.0–34.0)
MCHC: 29.9 g/dL — AB (ref 30.0–36.0)
MCV: 84.8 fL (ref 80.0–100.0)
Monocytes Absolute: 1.1 10*3/uL — ABNORMAL HIGH (ref 0.1–1.0)
Monocytes Relative: 10 %
NRBC: 0 % (ref 0.0–0.2)
Neutro Abs: 9.8 10*3/uL — ABNORMAL HIGH (ref 1.7–7.7)
Neutrophils Relative %: 82 %
Platelets: 285 10*3/uL (ref 150–400)
RBC: 4.53 MIL/uL (ref 4.22–5.81)
RDW: 17.2 % — ABNORMAL HIGH (ref 11.5–15.5)
WBC: 11.9 10*3/uL — AB (ref 4.0–10.5)

## 2018-08-17 LAB — I-STAT CG4 LACTIC ACID, ED: Lactic Acid, Venous: 1.86 mmol/L (ref 0.5–1.9)

## 2018-08-17 MED ORDER — ACETAMINOPHEN 500 MG PO TABS
1000.0000 mg | ORAL_TABLET | Freq: Once | ORAL | Status: AC
  Administered 2018-08-17: 1000 mg via ORAL
  Filled 2018-08-17: qty 2

## 2018-08-17 MED ORDER — LACTATED RINGERS IV BOLUS
1000.0000 mL | Freq: Once | INTRAVENOUS | Status: AC
  Administered 2018-08-17: 1000 mL via INTRAVENOUS

## 2018-08-17 MED ORDER — VANCOMYCIN HCL 10 G IV SOLR
2000.0000 mg | Freq: Once | INTRAVENOUS | Status: DC
  Filled 2018-08-17: qty 2000

## 2018-08-17 MED ORDER — METRONIDAZOLE IN NACL 5-0.79 MG/ML-% IV SOLN
500.0000 mg | Freq: Three times a day (TID) | INTRAVENOUS | Status: DC
  Administered 2018-08-18: 500 mg via INTRAVENOUS
  Filled 2018-08-17: qty 100

## 2018-08-17 MED ORDER — VANCOMYCIN HCL IN DEXTROSE 1-5 GM/200ML-% IV SOLN: 1000.0000 mg | Freq: Once | INTRAVENOUS | Status: DC

## 2018-08-17 MED ORDER — SODIUM CHLORIDE 0.9 % IV SOLN
2.0000 g | Freq: Once | INTRAVENOUS | Status: AC
  Administered 2018-08-17: 2 g via INTRAVENOUS
  Filled 2018-08-17: qty 2

## 2018-08-17 NOTE — Progress Notes (Signed)
A consult was received from an ED physician for Vancomycin, cefepime per pharmacy dosing.  The patient's profile has been reviewed for ht/wt/allergies/indication/available labs.   A one time order has been placed for Vancomycin 2gm iv x1, and Cefepime 2gm iv x1.  Further antibiotics/pharmacy consults should be ordered by admitting physician if indicated.                       Thank you, Nani Skillern Crowford 08/17/2018  10:42 PM

## 2018-08-17 NOTE — ED Triage Notes (Signed)
Pt reports fever of 101 at home and has not taken any ibuprofen or tylenol. Pt has a hx of colon cancer with mets to the liver. Last chemo was 2 weeks ago and pt has a chemo appointment scheduled for tomorrow.

## 2018-08-17 NOTE — ED Provider Notes (Signed)
[facility] DEPT Provider Note   CSN: [id number] Arrival date & time: 08/17/18  2206     History   Chief Complaint Chief Complaint  Patient presents with  . Fever  . Cancer    HPI Scott Gallagher is a 48 y.o. male.  48yo M w/ PMH including metastatic colon CA on chemotherapy, T2DM, HLD, OSA who p/w fever. Earlier today he was feeling nauseated and burped which caused him to reflux into his mouth. He thinks he may have aspirated a Jasaun Carn bit because he was coughing afterwards and since then hasn't felt well. No cough before that event. He has continued to cough this evening and checked his temperature and he had T 101.2 at home. No vomiting or diarrhea, has had some constipation. He has chronic abdominal pain that has not been that bad today. No dysuria, has mild suprapubic pain after he urinates. No sick contacts. No problems at port site.  The history is provided by the patient.  Fever      Past Medical History:  Diagnosis Date  . Anemia   . Arthritis   . Colon cancer ([place]) dx'd 05/2016  . Diabetes mellitus without complication (HCC)    diet controlled  . History of blood transfusion   . Hyperlipidemia   . liver mets dx'd 05/2016  . Sleep apnea    cpap  . Wears glasses     Patient Active Problem List   Diagnosis Date Noted  . Counseling regarding advanced care planning and goals of care 05/27/2017  . 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 Genetic testing 10/30/2016  . Port catheter in place 09/25/2016  . Family history of colon cancer 07/24/2016  . Family history of prostate cancer 07/24/2016  . Metastatic colon cancer to liver ([place]) 07/01/2016  . Right Colon cancer metastasized to liver ([place]) 06/17/2016  . Chronic GI bleeding 05/29/2016  . Diabetes mellitus type 2, diet-controlled ([place]) 05/22/2016  . OSA (obstructive sleep apnea) 05/22/2016  . Hyperlipidemia 05/22/2016  . Anemia due to blood loss, chronic   .  Antineoplastic chemotherapy induced pancytopenia (CODE) ([place]) 05/21/2016    Past Surgical History:  Procedure Laterality Date  . COLON SURGERY    . IR GENERIC HISTORICAL  09/24/2016   IR CV LINE INJECTION 09/24/2016 [facility]  . IR GENERIC HISTORICAL  09/24/2016   IR US GUIDE VASC ACCESS RIGHT 09/24/2016 [facility]  . IR GENERIC HISTORICAL  09/24/2016   IR FLUORO GUIDE CV LINE RIGHT 09/24/2016 [facility]  . IR GENERIC HISTORICAL  09/30/2016   IR FLUORO GUIDE PORT INSERTION RIGHT 09/30/2016 Markus Daft, MD [facility]  . IR GENERIC HISTORICAL  09/30/2016   IR US GUIDE VASC ACCESS RIGHT 09/30/2016 Markus Daft, MD [facility]  . IR GENERIC HISTORICAL  09/30/2016   IR REMOVAL TUN ACCESS W/ PORT W/O FL MOD SED 09/30/2016 Markus Daft, MD [facility]  . LAPAROSCOPIC RIGHT HEMI COLECTOMY Right 06/17/2016   Procedure: LAPAROSCOPIC ASSISTED  RIGHT HEMI COLECTOMY;  Surgeon: [name], MD;  Location: [facility];  Service: General;  Laterality: Right;  . PORTACATH PLACEMENT Left 07/13/2016   Procedure: INSERTION PORT-A-CATH left subclavian;  Surgeon: [name], MD;  Location: [facility];  Service: General;  Laterality: Left;  . SHOULDER ACROMIOPLASTY Right 12/20/2014   Procedure: SHOULDER ACROMIOPLASTY;  Surgeon: Melrose Nakayama, MD;  Location: [facility];  Service: Orthopedics;  Laterality: Right;  . SHOULDER ARTHROSCOPY Right 12/20/2014   Procedure: RIGHT ARTHROSCOPY SHOULDER WITH DEBRIDEMENT;  Surgeon: Melrose Nakayama, MD;  Location: [facility];  Service: Orthopedics;  Laterality: Right;  . TESTICLE SURGERY     as teen  . TONSILLECTOMY    . WISDOM TOOTH EXTRACTION          Home Medications    Prior to Admission medications   Medication Sig Start Date End Date Taking? Authorizing Provider  dronabinol (MARINOL) 5 MG capsule Take 1 capsule (5 mg total) by mouth 2 (two) times daily before a meal. 07/21/18  Yes Brunetta Genera, MD  fentaNYL (DURAGESIC - DOSED MCG/HR) 75  MCG/HR Place 1 patch (75 mcg total) onto the skin every 3 (three) days. 08/08/18  Yes Brunetta Genera, MD  loperamide (IMODIUM A-D) 2 MG tablet Take 1-2 tablets (2-4 mg total) by mouth 4 (four) times daily as needed for diarrhea or loose stools. 06/22/18  Yes Brunetta Genera, MD  LORazepam (ATIVAN) 0.5 MG tablet Take 1-2 tablets (0.5-1 mg total) by mouth every 8 (eight) hours as needed for anxiety (nausea). 08/04/18  Yes Brunetta Genera, MD  Melatonin 3 MG TABS Take 6 tablets by mouth at bedtime as needed (sleep).  05/14/18  Yes [provider]  ondansetron (ZOFRAN ODT) 4 MG disintegrating tablet Take 1 tablet (4 mg total) by mouth every 8 (eight) hours as needed for nausea or vomiting. 05/08/18  Yes Montine Circle, PA-C  ondansetron (ZOFRAN) 8 MG tablet Take 1 tablet (8 mg total) by mouth 2 (two) times daily as needed for refractory nausea / vomiting. Start on day 3 after chemotherapy. 06/22/18  Yes Brunetta Genera, MD  Oxycodone HCl 10 MG TABS Take 1-2 tablets (10-20 mg total) by mouth every 4 (four) hours as needed. 08/04/18  Yes Brunetta Genera, MD  polyethylene glycol [facility]) packet Take 17 g by mouth daily. Patient taking differently: Take 17 g by mouth daily as needed for mild constipation.  06/09/18  Yes Brunetta Genera, MD  prochlorperazine (COMPAZINE) 10 MG tablet TAKE 1 TABLET BY MOUTH EVERY 6 HOURS AS NEEDED FOR NAUSEA / VOMITING 08/04/18  Yes Brunetta Genera, MD  senna-docusate (SENNA S) 8.6-50 MG tablet Take 2 tablets by mouth 2 (two) times daily. 08/04/18  Yes Brunetta Genera, MD  CREON 12000 units CPEP capsule TAKE ONE CAPSULE BY MOUTH THREE TIMES DAILY BEFORE MEALS Patient not taking: Reported on 07/21/2018 06/12/18   Brunetta Genera, MD  dexamethasone (DECADRON) 4 MG tablet TAKE 2 TABLETS BY MOUTH WITH FOOD DAILY (START THE DAY AFTER CHEMOTHERAPY FOR 2 DAYS) Patient not taking: Take 2 tabs for 2 Days After Chemo 02/18/18   Brunetta Genera, MD    Family History Family History  Problem Relation Age of Onset  . Diabetes Other   . Hyperlipidemia Other   . Hypertension Other   . Breast cancer Maternal Aunt 70  . Prostate cancer Maternal Uncle 75  . Lung cancer Paternal Uncle 61  . Stroke Maternal Grandfather 72  . Cancer Paternal Grandmother 87       dx cancer of pancreas and colon, unknown if separate primaries  . Heart Problems Paternal Grandfather        d. 53  . Prostate cancer Maternal Uncle 96  . Breast cancer Maternal Aunt 75  . Breast cancer Maternal Aunt 68  . Cervical cancer Maternal Aunt 68  . Pancreatic cancer Maternal Aunt 54       d. 58y; heavy smoker  . Colon cancer Paternal Uncle  11       s/p partial colectomy; dx. second colon cancer at age 43-64    Social History Social History   Tobacco Use  . Smoking status: Former Smoker    Packs/day: 1.50    Years: 29.00    Pack years: 43.50    Types: Cigarettes    Last attempt to quit: 12/16/2012    Years since quitting: 5.6  . Smokeless tobacco: Never Used  . Tobacco comment: 1-2 ppd from age [age]y to 2014  Substance Use Topics  . Alcohol use: Yes    Alcohol/week: 3.0 standard drinks    Types: 3 Shots of liquor per week    Comment:  some days  . Drug use: No     Allergies   Penicillins   Review of Systems Review of Systems  Constitutional: Positive for fever.  All other systems reviewed and are negative except that which was mentioned in HPI    Physical Exam Updated Vital Signs BP 121/82   Pulse (!) 115   Temp 99.5 F (37.5 C) (Oral)   Resp 15   Ht 6\' 2"  (1.88 m)   Wt 100.7 kg   SpO2 93%   BMI 28.50 kg/m   Physical Exam Vitals signs and nursing note reviewed.  Constitutional:      General: He is not in acute distress.    Appearance: He is well-developed.  HENT:     Head: Normocephalic and atraumatic.     Nose: Nose normal.     Mouth/Throat:     Mouth: Mucous membranes are dry.     Pharynx: Oropharynx is  clear.  Eyes:     Conjunctiva/sclera: Conjunctivae normal.     Pupils: Pupils are equal, round, and reactive to light.  Neck:     Musculoskeletal: Neck supple.  Cardiovascular:     Rate and Rhythm: Regular rhythm. Tachycardia present.     Heart sounds: Normal heart sounds. No murmur.  Pulmonary:     Effort: Pulmonary effort is normal.     Breath sounds: Normal breath sounds.  Abdominal:     General: Bowel sounds are normal. There is no distension.     Palpations: Abdomen is soft.     Tenderness: There is no abdominal tenderness.  Skin:    General: Skin is warm and dry.     Comments: R port without redness  Neurological:     Mental Status: He is alert and oriented to person, place, and time.     Comments: Fluent speech  Psychiatric:        Judgment: Judgment normal.      ED Treatments / Results  Labs (all labs ordered are listed, but only abnormal results are displayed) Labs Reviewed  COMPREHENSIVE METABOLIC PANEL - Abnormal; Notable for the following components:      Result Value   Glucose, Bld 133 (*)    Calcium 8.7 (*)    All other components within normal limits  CBC WITH DIFFERENTIAL/PLATELET - Abnormal; Notable for the following components:   WBC 11.9 (*)    Hemoglobin 11.5 (*)    HCT 38.4 (*)    MCH 25.4 (*)    MCHC 29.9 (*)    RDW 17.2 (*)    Neutro Abs 9.8 (*)    Monocytes Absolute 1.1 (*)    All other components within normal limits  URINE CULTURE  RESPIRATORY PANEL BY PCR  CULTURE, BLOOD (ROUTINE X 2)  CULTURE, BLOOD (ROUTINE X 2)  URINALYSIS, ROUTINE  W REFLEX MICROSCOPIC  I-STAT CG4 LACTIC ACID, ED    EKG EKG Interpretation  Date/Time:  Wednesday August 17 2018 23:02:09 EST Ventricular Rate:  114 PR Interval:    QRS Duration: 90 QT Interval:  309 QTC Calculation: 426 R Axis:   35 Text Interpretation:  Sinus tachycardia tachycardia new from previous Confirmed by Theotis Burrow [phone]) on 08/17/2018 11:05:17 PM   Radiology Dg Chest 2  View  Result Date: 08/17/2018 CLINICAL DATA:  Colon cancer. Fever. EXAM: CHEST - 2 VIEW COMPARISON:  12/23/2017 chest radiograph. FINDINGS: Right internal jugular Port-A-Cath terminates in middle third of the SVC. Stable cardiomediastinal silhouette with normal heart size. No pneumothorax. No pleural effusion. Mild elevation of the left hemidiaphragm. No pulmonary edema. Hazy posterior lung base opacity on the lateral view, favor basilar right lower lobe opacity. IMPRESSION: Hazy posterior lung base opacity on the lateral view, favor basilar right lower lobe opacity, which could represent atelectasis or pneumonia. Follow-up chest radiographs advised. Electronically Signed   By: Ilona Sorrel M.D.   On: 08/17/2018 22:50    Procedures .Critical Care Performed by: Sharlett Iles, MD Authorized by: Sharlett Iles, MD   Critical care provider statement:    Critical care time (minutes):  30   Critical care time was exclusive of:  Separately billable procedures and treating other patients   Critical care was necessary to treat or prevent imminent or life-threatening deterioration of the following conditions:  Sepsis   Critical care was time spent personally by me on the following activities:  Development of treatment plan with patient or surrogate, evaluation of patient's response to treatment, obtaining history from patient or surrogate, ordering and performing treatments and interventions, ordering and review of laboratory studies, ordering and review of radiographic studies, re-evaluation of patient's condition and review of old charts   (including critical care time)  Medications Ordered in ED Medications  metroNIDAZOLE (FLAGYL) IVPB 500 mg ( Intravenous Rate/Dose Verify 08/18/18 0027)  vancomycin (VANCOCIN) 2,000 mg in sodium chloride 0.9 % 500 mL IVPB (has no administration in time range)  ceFEPIme (MAXIPIME) 2 g in sodium chloride 0.9 % 100 mL IVPB ( Intravenous Stopped 08/18/18 0001)   acetaminophen (TYLENOL) tablet 1,000 mg (1,000 mg Oral Given 08/17/18 2325)  lactated ringers bolus 1,000 mL (0 mLs Intravenous Stopped 08/18/18 0026)     Initial Impression / Assessment and Plan / ED Course  I have reviewed the triage vital signs and the nursing notes.  Pertinent labs & imaging results that were available during my care of the patient were reviewed by me and considered in my medical decision making (see chart for details).    He was nontoxic on exam, temperature 99.5 orally, heart rate 128.  Initiated code sepsis with blood and urine cultures, IV fluids, Tylenol, vanc, cefepime, and Flagyl. CXR shows right lower lobe opacity.  It is possible that he has an aspiration pneumonia.  WBC 11.9, CMP reassuring. Discussed with Dr. Marlowe Sax and pt admitted for further care.  Final Clinical Impressions(s) / ED Diagnoses   Final diagnoses:  Pneumonia of right lower lobe due to infectious organism [facility])    ED Discharge Orders    None       Dearl Rudden, Wenda Overland, MD 08/18/18 [phone]

## 2018-08-18 ENCOUNTER — Inpatient Hospital Stay: Payer: 59 | Admitting: Hematology

## 2018-08-18 ENCOUNTER — Encounter (HOSPITAL_COMMUNITY): Payer: Self-pay

## 2018-08-18 ENCOUNTER — Other Ambulatory Visit: Payer: Self-pay

## 2018-08-18 ENCOUNTER — Observation Stay (HOSPITAL_COMMUNITY): Payer: 59

## 2018-08-18 ENCOUNTER — Inpatient Hospital Stay: Payer: 59 | Admitting: Nutrition

## 2018-08-18 ENCOUNTER — Inpatient Hospital Stay: Payer: 59

## 2018-08-18 DIAGNOSIS — R112 Nausea with vomiting, unspecified: Secondary | ICD-10-CM

## 2018-08-18 DIAGNOSIS — D649 Anemia, unspecified: Secondary | ICD-10-CM

## 2018-08-18 DIAGNOSIS — J690 Pneumonitis due to inhalation of food and vomit: Secondary | ICD-10-CM

## 2018-08-18 DIAGNOSIS — R109 Unspecified abdominal pain: Secondary | ICD-10-CM

## 2018-08-18 DIAGNOSIS — R5381 Other malaise: Secondary | ICD-10-CM

## 2018-08-18 DIAGNOSIS — A419 Sepsis, unspecified organism: Secondary | ICD-10-CM

## 2018-08-18 DIAGNOSIS — F419 Anxiety disorder, unspecified: Secondary | ICD-10-CM

## 2018-08-18 LAB — COMPREHENSIVE METABOLIC PANEL
ALBUMIN: 3.6 g/dL (ref 3.5–5.0)
ALT: 18 U/L (ref 0–44)
ANION GAP: 10 (ref 5–15)
AST: 23 U/L (ref 15–41)
Alkaline Phosphatase: 105 U/L (ref 38–126)
BUN: 16 mg/dL (ref 6–20)
CO2: 25 mmol/L (ref 22–32)
Calcium: 8.7 mg/dL — ABNORMAL LOW (ref 8.9–10.3)
Chloride: 100 mmol/L (ref 98–111)
Creatinine, Ser: 0.88 mg/dL (ref 0.61–1.24)
GFR calc Af Amer: >60 mL/min (ref >60)
GFR calc non Af Amer: >60 mL/min (ref >60)
GLUCOSE: 133 mg/dL — AB (ref 70–99)
Potassium: 4.2 mmol/L (ref 3.5–5.1)
Sodium: 135 mmol/L (ref 135–145)
Total Bilirubin: 1.2 mg/dL (ref 0.3–1.2)
Total Protein: 6.5 g/dL (ref 6.5–8.1)

## 2018-08-18 LAB — RESPIRATORY PANEL BY PCR

## 2018-08-18 LAB — CBC
HCT: 37.3 % — ABNORMAL LOW (ref 39.0–52.0)
Hemoglobin: 11 g/dL — ABNORMAL LOW (ref 13.0–17.0)
MCH: 25.2 pg — ABNORMAL LOW (ref 26.0–34.0)
MCHC: 29.5 g/dL — AB (ref 30.0–36.0)
MCV: 85.6 fL (ref 80.0–100.0)
Platelets: 230 10*3/uL (ref 150–400)
RBC: 4.36 MIL/uL (ref 4.22–5.81)
RDW: 17.1 % — ABNORMAL HIGH (ref 11.5–15.5)
WBC: 9.6 10*3/uL (ref 4.0–10.5)
nRBC: 0.5 % — ABNORMAL HIGH (ref 0.0–0.2)

## 2018-08-18 LAB — URINALYSIS, ROUTINE W REFLEX MICROSCOPIC
Bilirubin Urine: NEGATIVE
Glucose, UA: NEGATIVE mg/dL
Hgb urine dipstick: NEGATIVE
Ketones, ur: NEGATIVE mg/dL
Leukocytes, UA: NEGATIVE
Nitrite: NEGATIVE
Protein, ur: NEGATIVE mg/dL
Specific Gravity, Urine: 1.015 (ref 1.005–1.030)
pH: 5 (ref 5.0–8.0)

## 2018-08-18 LAB — HEMOGLOBIN A1C
Hgb A1c MFr Bld: 5.7 % — ABNORMAL HIGH (ref 4.8–5.6)
Mean Plasma Glucose: 116.89 mg/dL

## 2018-08-18 LAB — HIV ANTIBODY (ROUTINE TESTING W REFLEX): HIV Screen 4th Generation wRfx: NONREACTIVE

## 2018-08-18 MED ORDER — BISACODYL 10 MG RE SUPP
10.0000 mg | Freq: Every day | RECTAL | Status: DC | PRN
  Administered 2018-08-19: 10 mg via RECTAL
  Filled 2018-08-18: qty 1

## 2018-08-18 MED ORDER — ONDANSETRON HCL 4 MG/2ML IJ SOLN
4.0000 mg | Freq: Four times a day (QID) | INTRAMUSCULAR | Status: DC | PRN
  Administered 2018-08-19 – 2018-08-20 (×4): 4 mg via INTRAVENOUS
  Filled 2018-08-18 (×5): qty 2

## 2018-08-18 MED ORDER — FENTANYL 25 MCG/HR TD PT72
75.0000 ug | MEDICATED_PATCH | TRANSDERMAL | Status: DC
  Administered 2018-08-20: 75 ug via TRANSDERMAL
  Filled 2018-08-18: qty 3

## 2018-08-18 MED ORDER — LORAZEPAM 0.5 MG PO TABS: 0.5000 mg | ORAL_TABLET | Freq: Three times a day (TID) | ORAL | Status: DC | PRN

## 2018-08-18 MED ORDER — ACETAMINOPHEN 325 MG PO TABS: 650.0000 mg | ORAL_TABLET | Freq: Four times a day (QID) | ORAL | Status: DC | PRN

## 2018-08-18 MED ORDER — DRONABINOL 5 MG PO CAPS
5.0000 mg | ORAL_CAPSULE | Freq: Two times a day (BID) | ORAL | Status: DC
  Administered 2018-08-18 – 2018-08-20 (×4): 5 mg via ORAL
  Filled 2018-08-18 (×4): qty 1

## 2018-08-18 MED ORDER — POLYETHYLENE GLYCOL 3350 17 G PO PACK
17.0000 g | PACK | Freq: Two times a day (BID) | ORAL | Status: DC
  Administered 2018-08-18 – 2018-08-19 (×4): 17 g via ORAL
  Filled 2018-08-18 (×4): qty 1

## 2018-08-18 MED ORDER — SODIUM CHLORIDE 0.9 % IV SOLN
2.0000 g | Freq: Three times a day (TID) | INTRAVENOUS | Status: DC
  Administered 2018-08-18 – 2018-08-20 (×7): 2 g via INTRAVENOUS
  Filled 2018-08-18 (×8): qty 2

## 2018-08-18 MED ORDER — SODIUM CHLORIDE 0.9 % IV SOLN
INTRAVENOUS | Status: DC
  Administered 2018-08-18: 09:00:00 via INTRAVENOUS

## 2018-08-18 MED ORDER — METRONIDAZOLE IN NACL 5-0.79 MG/ML-% IV SOLN
500.0000 mg | Freq: Three times a day (TID) | INTRAVENOUS | Status: DC
  Administered 2018-08-18 – 2018-08-20 (×7): 500 mg via INTRAVENOUS
  Filled 2018-08-18 (×7): qty 100

## 2018-08-18 MED ORDER — MELATONIN 5 MG PO TABS
10.0000 mg | ORAL_TABLET | Freq: Every evening | ORAL | Status: DC | PRN
  Filled 2018-08-18: qty 2

## 2018-08-18 MED ORDER — ENOXAPARIN SODIUM 40 MG/0.4ML ~~LOC~~ SOLN
40.0000 mg | SUBCUTANEOUS | Status: DC
  Administered 2018-08-18 – 2018-08-20 (×3): 40 mg via SUBCUTANEOUS
  Filled 2018-08-18 (×3): qty 0.4

## 2018-08-18 MED ORDER — SODIUM CHLORIDE 0.9 % IV SOLN
INTRAVENOUS | Status: AC
  Administered 2018-08-18: 21:00:00 via INTRAVENOUS

## 2018-08-18 MED ORDER — ACETAMINOPHEN 650 MG RE SUPP: 650.0000 mg | Freq: Four times a day (QID) | RECTAL | Status: DC | PRN

## 2018-08-18 MED ORDER — SENNOSIDES-DOCUSATE SODIUM 8.6-50 MG PO TABS
2.0000 | ORAL_TABLET | Freq: Two times a day (BID) | ORAL | Status: DC
  Administered 2018-08-18 – 2018-08-19 (×4): 2 via ORAL
  Filled 2018-08-18 (×5): qty 2

## 2018-08-18 MED ORDER — OXYCODONE HCL 5 MG PO TABS
10.0000 mg | ORAL_TABLET | ORAL | Status: DC | PRN
  Administered 2018-08-18: 20 mg via ORAL
  Administered 2018-08-18 (×2): 10 mg via ORAL
  Administered 2018-08-19: 20 mg via ORAL
  Administered 2018-08-19: 10 mg via ORAL
  Administered 2018-08-19 – 2018-08-20 (×7): 20 mg via ORAL
  Filled 2018-08-18: qty 4
  Filled 2018-08-18: qty 2
  Filled 2018-08-18: qty 4
  Filled 2018-08-18: qty 2
  Filled 2018-08-18 (×5): qty 4
  Filled 2018-08-18: qty 2
  Filled 2018-08-18 (×2): qty 4

## 2018-08-18 MED ORDER — MORPHINE SULFATE (PF) 2 MG/ML IV SOLN
1.0000 mg | INTRAVENOUS | Status: DC | PRN
  Administered 2018-08-18: 1 mg via INTRAVENOUS
  Filled 2018-08-18: qty 1

## 2018-08-18 MED ORDER — SODIUM CHLORIDE 0.9% FLUSH
10.0000 mL | INTRAVENOUS | Status: DC | PRN
  Administered 2018-08-20: 10 mL
  Filled 2018-08-18: qty 40

## 2018-08-18 NOTE — H&P (Signed)
History and Physical    Scott Gallagher SFK:[id number] DOB: 1970-10-14 DOA: 08/17/2018  PCP: [name], MD Patient coming from: Home  Chief Complaint: Fever  HPI: Scott Gallagher is a 48 y.o. 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  Patient states he felt nauseated this morning and burped.  He thinks he may have aspirated the contents in his mouth.  Since then, he has been coughing all day.  He later checked his temperature at home and it was 101.2.  He called his oncologist's office and was advised to come into the hospital.  Reports having chronic intermittent nausea secondary to chemotherapy.  Does state that he vomited bile a few days ago.  Wife at bedside is concerned that patient has not been eating much.  He has chronic abdominal pain secondary to his colon cancer but wife believes patient has been very uncomfortable lately due to increasing abdominal pain.  Patient states his abdominal pain is in the left lower quadrant region.  He has been taking pain medications at home and his last bowel movement was 6 days ago.  He has been taking senna which has not helped him have a bowel movement yet.  Review of Systems: As per HPI otherwise 10 point review of systems negative.  Past Medical History:  Diagnosis Date  . Anemia   . Arthritis   . Colon cancer ([place]) dx'd 05/2016  . Diabetes mellitus without complication (HCC)    diet controlled  . History of blood transfusion   . Hyperlipidemia   . liver mets dx'd 05/2016  . Sleep apnea    cpap  . Wears glasses     Past Surgical History:  Procedure Laterality Date  . COLON SURGERY    . IR GENERIC HISTORICAL  09/24/2016   IR CV LINE INJECTION 09/24/2016 [facility]  . IR GENERIC HISTORICAL  09/24/2016   IR US GUIDE VASC ACCESS RIGHT 09/24/2016 [facility]    . IR GENERIC HISTORICAL  09/24/2016   IR FLUORO GUIDE CV LINE RIGHT 09/24/2016 [facility]  . IR GENERIC HISTORICAL  09/30/2016   IR FLUORO GUIDE PORT INSERTION RIGHT 09/30/2016 Markus Daft, MD [facility]  . IR GENERIC HISTORICAL  09/30/2016   IR US GUIDE VASC ACCESS RIGHT 09/30/2016 Markus Daft, MD [facility]  . IR GENERIC HISTORICAL  09/30/2016   IR REMOVAL TUN ACCESS W/ PORT W/O FL MOD SED 09/30/2016 Markus Daft, MD [facility]  . LAPAROSCOPIC RIGHT HEMI COLECTOMY Right 06/17/2016   Procedure: LAPAROSCOPIC ASSISTED  RIGHT HEMI COLECTOMY;  Surgeon: [name], MD;  [facility];  Service: General;  Laterality: Right;  . PORTACATH PLACEMENT Left 07/13/2016   Procedure: INSERTION PORT-A-CATH left subclavian;  Surgeon: [name], MD;  [facility];  Service: General;  Laterality: Left;  . SHOULDER ACROMIOPLASTY Right 12/20/2014   Procedure: SHOULDER ACROMIOPLASTY;  Surgeon: Melrose Nakayama, MD;  Location: [place];  Service: Orthopedics;  Laterality: Right;  . SHOULDER ARTHROSCOPY Right 12/20/2014   Procedure: RIGHT ARTHROSCOPY SHOULDER WITH DEBRIDEMENT;  Surgeon: Melrose Nakayama, MD;  Location: [place];  Service: Orthopedics;  Laterality: Right;  . TESTICLE SURGERY     as teen  . TONSILLECTOMY    . WISDOM TOOTH EXTRACTION       reports that he quit smoking about 5 years ago. His smoking use  included cigarettes. He has a 43.50 pack-year smoking history. He has never used smokeless tobacco. He reports current alcohol use of about 3.0 standard drinks of alcohol per week. He reports that he does not use drugs.  Allergies  Allergen Reactions  . Penicillins     Was told as a child he was allergic does not know of type of reaction.   Has patient had a PCN reaction causing immediate rash, facial/tongue/throat swelling, SOB or lightheadedness with hypotension: N Has patient had a PCN reaction causing severe rash involving mucus membranes or skin necrosis:  N Has patient had a PCN reaction that required hospitalization: N Has patient had a PCN reaction occurring within the last 10 years: N If all of the above answers are "NO", then may proceed with Cephalospo    Family History  Problem Relation Age of Onset  . Diabetes Other   . Hyperlipidemia Other   . Hypertension Other   . Breast cancer Maternal Aunt 70  . Prostate cancer Maternal Uncle 75  . Lung cancer Paternal Uncle 23  . Stroke Maternal Grandfather 72  . Cancer Paternal Grandmother 51       dx cancer of pancreas and colon, unknown if separate primaries  . Heart Problems Paternal Grandfather        d. 81  . Prostate cancer Maternal Uncle 78  . Breast cancer Maternal Aunt 75  . Breast cancer Maternal Aunt 68  . Cervical cancer Maternal Aunt 68  . Pancreatic cancer Maternal Aunt 54       d. 58y; heavy smoker  . Colon cancer Paternal Uncle 32       s/p partial colectomy; dx. second colon cancer at age 12-64    Prior to Admission medications   Medication Sig Start Date End Date Taking? Authorizing Provider  dronabinol (MARINOL) 5 MG capsule Take 1 capsule (5 mg total) by mouth 2 (two) times daily before a meal. 07/21/18  Yes Brunetta Genera, MD  fentaNYL (DURAGESIC - DOSED MCG/HR) 75 MCG/HR Place 1 patch (75 mcg total) onto the skin every 3 (three) days. 08/08/18  Yes Brunetta Genera, MD  loperamide (IMODIUM A-D) 2 MG tablet Take 1-2 tablets (2-4 mg total) by mouth 4 (four) times daily as needed for diarrhea or loose stools. 06/22/18  Yes Brunetta Genera, MD  LORazepam (ATIVAN) 0.5 MG tablet Take 1-2 tablets (0.5-1 mg total) by mouth every 8 (eight) hours as needed for anxiety (nausea). 08/04/18  Yes Brunetta Genera, MD  Melatonin 3 MG TABS Take 6 tablets by mouth at bedtime as needed (sleep).  05/14/18  Yes [provider]  ondansetron (ZOFRAN ODT) 4 MG disintegrating tablet Take 1 tablet (4 mg total) by mouth every 8 (eight) hours as needed for nausea or  vomiting. 05/08/18  Yes Montine Circle, PA-C  ondansetron (ZOFRAN) 8 MG tablet Take 1 tablet (8 mg total) by mouth 2 (two) times daily as needed for refractory nausea / vomiting. Start on day 3 after chemotherapy. 06/22/18  Yes Brunetta Genera, MD  Oxycodone HCl 10 MG TABS Take 1-2 tablets (10-20 mg total) by mouth every 4 (four) hours as needed. 08/04/18  Yes Brunetta Genera, MD  polyethylene glycol [facility]) packet Take 17 g by mouth daily. Patient taking differently: Take 17 g by mouth daily as needed for mild constipation.  06/09/18  Yes Brunetta Genera, MD  prochlorperazine (COMPAZINE) 10 MG tablet TAKE 1 TABLET BY MOUTH EVERY 6 HOURS AS NEEDED  FOR NAUSEA / VOMITING 08/04/18  Yes Brunetta Genera, MD  senna-docusate (SENNA S) 8.6-50 MG tablet Take 2 tablets by mouth 2 (two) times daily. 08/04/18  Yes Brunetta Genera, MD  CREON 12000 units CPEP capsule TAKE ONE CAPSULE BY MOUTH THREE TIMES DAILY BEFORE MEALS Patient not taking: Reported on 07/21/2018 06/12/18   Brunetta Genera, MD  dexamethasone (DECADRON) 4 MG tablet TAKE 2 TABLETS BY MOUTH WITH FOOD DAILY (START THE DAY AFTER CHEMOTHERAPY FOR 2 DAYS) Patient not taking: Take 2 tabs for 2 Days After Chemo 02/18/18   Brunetta Genera, MD    Physical Exam: Vitals:   08/18/18 0051 08/18/18 0100 08/18/18 0213 08/18/18 0235  BP:  104/68 112/77   Pulse:  100 97   Resp:  11 18   Temp: 98.5 F (36.9 C)  98.8 F (37.1 C)   TempSrc: Oral  Oral   SpO2:  92% 97%   Weight:    100 kg  Height:    6\' 2"  (1.88 m)    Physical Exam  Constitutional: He is oriented to person, place, and time.  Appears fatigued  HENT:  Head: Normocephalic.  Eyes: Right eye exhibits no discharge. Left eye exhibits no discharge.  Neck: Neck supple.  Cardiovascular: Regular rhythm and intact distal pulses.  Slightly tachycardic  Pulmonary/Chest: Effort normal and breath sounds normal. No respiratory distress. He has no wheezes. He has  no rales.  Abdominal: Soft. Bowel sounds are normal. He exhibits distension. There is abdominal tenderness. There is no rebound and no guarding.  Left lower quadrant tender to palpation  Musculoskeletal:        General: No edema.  Neurological: He is alert and oriented to person, place, and time.  Skin: Skin is warm and dry. He is not diaphoretic.     Labs on Admission: I have personally reviewed following labs and imaging studies  CBC: Recent Labs  Lab 08/17/18 2254  WBC 11.9*  NEUTROABS 9.8*  HGB 11.5*  HCT 38.4*  MCV 84.8  PLT 025   Basic Metabolic Panel: Recent Labs  Lab 08/17/18 2254  NA 135  K 4.2  CL 100  CO2 25  GLUCOSE 133*  BUN 16  CREATININE 0.88  CALCIUM 8.7*   GFR: Estimated Creatinine Clearance: 129.7 mL/min (by C-G formula based on SCr of 0.88 mg/dL). Liver Function Tests: Recent Labs  Lab 08/17/18 2254  AST 23  ALT 18  ALKPHOS 105  BILITOT 1.2  PROT 6.5  ALBUMIN 3.6   No results for input(s): LIPASE, AMYLASE in the last 168 hours. No results for input(s): AMMONIA in the last 168 hours. Coagulation Profile: No results for input(s): INR, PROTIME in the last 168 hours. Cardiac Enzymes: No results for input(s): CKTOTAL, CKMB, CKMBINDEX, TROPONINI in the last 168 hours. BNP (last 3 results) No results for input(s): PROBNP in the last 8760 hours. HbA1C: No results for input(s): HGBA1C in the last 72 hours. CBG: No results for input(s): GLUCAP in the last 168 hours. Lipid Profile: No results for input(s): CHOL, HDL, LDLCALC, TRIG, CHOLHDL, LDLDIRECT in the last 72 hours. Thyroid Function Tests: No results for input(s): TSH, T4TOTAL, FREET4, T3FREE, THYROIDAB in the last 72 hours. Anemia Panel: No results for input(s): VITAMINB12, FOLATE, FERRITIN, TIBC, IRON, RETICCTPCT in the last 72 hours. Urine analysis:    Component Value Date/Time   COLORURINE YELLOW 06/12/2018 [place] 06/12/2018 1352   LABSPEC 1.024 06/12/2018  1352   PHURINE 6.0  06/12/2018 [place] 06/12/2018 [place] 06/12/2018 1352   BILIRUBINUR NEGATIVE 06/12/2018 1352   KETONESUR NEGATIVE 06/12/2018 1352   PROTEINUR NEGATIVE 06/12/2018 1352   NITRITE NEGATIVE 06/12/2018 1352   LEUKOCYTESUR NEGATIVE 06/12/2018 1352    Radiological Exams on Admission: Dg Chest 2 View  Result Date: 08/17/2018 CLINICAL DATA:  Colon cancer. Fever. EXAM: CHEST - 2 VIEW COMPARISON:  12/23/2017 chest radiograph. FINDINGS: Right internal jugular Port-A-Cath terminates in middle third of the SVC. Stable cardiomediastinal silhouette with normal heart size. No pneumothorax. No pleural effusion. Mild elevation of the left hemidiaphragm. No pulmonary edema. Hazy posterior lung base opacity on the lateral view, favor basilar right lower lobe opacity. IMPRESSION: Hazy posterior lung base opacity on the lateral view, favor basilar right lower lobe opacity, which could represent atelectasis or pneumonia. Follow-up chest radiographs advised. Electronically Signed   By: Ilona Sorrel M.D.   On: 08/17/2018 22:50    EKG: Independently reviewed.  Sinus tachycardia (heart rate 114).  Assessment/Plan Principal Problem:   Aspiration pneumonia (HCC) Active Problems:   Diabetes mellitus type 2, diet-controlled (HCC)   OSA (obstructive sleep apnea)   Metastatic colon cancer to liver (HCC)   Sepsis (HCC)   Abdominal pain   Nausea & vomiting   Chronic anemia   Physical deconditioning   Anxiety   Sepsis secondary to aspiration pneumonia Febrile at home with temperature 101.2.  Tachycardic initially, now improving.  Blood pressure stable.  White count 11.9.  Lactic acid normal.  Patient is not hypoxic.  Chest x-ray showing basilar right lower lobe opacity concerning for pneumonia. -Cefepime and metronidazole.  Unable to do Unasyn due to penicillin allergy. -Tylenol PRN -Repeat CBC in a.m. -Respiratory viral panel pending -Supplemental oxygen if  needed -Blood culture x2 pending -UA, urine culture pending  Abdominal pain, nausea/ vomiting Patient has chronic intermittent nausea from chemotherapy.  Does report vomiting bile a few days ago.  His abdominal pain has been worse recently.  He has not had a bowel movement in 6 days.  On exam, abdomen appears distended and left lower quadrant tender to palpation.  Abdominal x-ray done on December 19 showing moderately large amount of stool burden.  Although his symptoms could be due to constipation, there is also concern for possible bowel obstruction in the setting of prior abdominal surgery and metastatic colon cancer. -STAT CT abdomen pelvis without contrast -Keep n.p.o. and hold off giving laxatives until bowel obstruction is ruled out -IV fluid hydration -Pain management: Morphine 1 mg every 4 hours as needed for severe pain, Tylenol PRN -IV Zofran PRN  Stage IV invasive adenocarcinoma of the colon with peritoneal and liver metastasis, s/p right hemicolectomy -Followed by oncology (Dr. Irene Limbo).  Last office visit August 04, 2018.  Currently on chemotherapy. -Outpatient oncology follow-up -Pain management: Morphine 1 mg every 4 hours as needed for severe pain, Tylenol PRN  Chronic anemia -Stable.  Hemoglobin 11.5, at baseline.  Physical deconditioning -PT evaluation  Diet controlled type 2 diabetes Blood glucose 133 on admission. -Check A1c  OSA -CPAP at night  Anxiety -Continue home Ativan PRN  Insomnia -Continue home melatonin PRN  DVT prophylaxis: Lovenox Code Status: Full code.  Discussed with the patient. Family Communication: Wife at bedside. Disposition Plan: Anticipate discharge in 1 to 2 days.   Consults called: None Admission status: Observation, telemetry   Shela Leff MD [facility] Pager [phone]  If 7PM-7AM, please contact night-coverage [email or web address] Password [facility]  08/18/2018,  3:31 AM

## 2018-08-18 NOTE — Progress Notes (Signed)
ED TO INPATIENT HANDOFF REPORT  ED Nurse Name and Phone #: Shey Bartmess [phone]  Name/Age/Gender Scott Gallagher 48 y.o. male Room/Bed: WA05/WA05  Code Status   Code Status: Full Code  Home/SNF/Other Home Patient oriented to: self, place, time and situation Is this baseline? Yes   Triage Complete: Triage complete  Chief Complaint Fever  Triage Note Pt reports fever of 101 at home and has not taken any ibuprofen or tylenol. Pt has a hx of colon cancer with mets to the liver. Last chemo was 2 weeks ago and pt has a chemo appointment scheduled for tomorrow.    Allergies Allergies  Allergen Reactions  . Penicillins     Was told as a child he was allergic does not know of type of reaction.   Has patient had a PCN reaction causing immediate rash, facial/tongue/throat swelling, SOB or lightheadedness with hypotension: N Has patient had a PCN reaction causing severe rash involving mucus membranes or skin necrosis: N Has patient had a PCN reaction that required hospitalization: N Has patient had a PCN reaction occurring within the last 10 years: N If all of the above answers are "NO", then may proceed with Cephalospo    Level of Care/Admitting Diagnosis ED Disposition    ED Disposition Condition [place]: [place] [[id number]]  Level of Care: Telemetry [5]  Admit to tele based on following criteria: Other see comments  Comments: Monitor hemodynamics  Diagnosis: Aspiration pneumonia [facility]) [[id number]]  Admitting Physician: Shela Leff [[id number]]  Attending Physician: Shela Leff [[id number]]  PT Class (Do Not Modify): Observation [104]  PT Acc Code (Do Not Modify): Observation [10022]       Medical/Surgery History Past Medical History:  Diagnosis Date  . Anemia   . Arthritis   . Colon cancer ([place]) dx'd 05/2016  . Diabetes mellitus without complication (HCC)    diet controlled  . History of blood transfusion   .  Hyperlipidemia   . liver mets dx'd 05/2016  . Sleep apnea    cpap  . Wears glasses    Past Surgical History:  Procedure Laterality Date  . COLON SURGERY    . IR GENERIC HISTORICAL  09/24/2016   IR CV LINE INJECTION 09/24/2016 [facility]  . IR GENERIC HISTORICAL  09/24/2016   IR US GUIDE VASC ACCESS RIGHT 09/24/2016 [facility]  . IR GENERIC HISTORICAL  09/24/2016   IR FLUORO GUIDE CV LINE RIGHT 09/24/2016 [facility]  . IR GENERIC HISTORICAL  09/30/2016   IR FLUORO GUIDE PORT INSERTION RIGHT 09/30/2016 Markus Daft, MD [facility]  . IR GENERIC HISTORICAL  09/30/2016   IR US GUIDE VASC ACCESS RIGHT 09/30/2016 Markus Daft, MD [facility]  . IR GENERIC HISTORICAL  09/30/2016   IR REMOVAL TUN ACCESS W/ PORT W/O FL MOD SED 09/30/2016 Markus Daft, MD [facility]  . LAPAROSCOPIC RIGHT HEMI COLECTOMY Right 06/17/2016   Procedure: LAPAROSCOPIC ASSISTED  RIGHT HEMI COLECTOMY;  Surgeon: [name], MD;  [facility];  Service: General;  Laterality: Right;  . PORTACATH PLACEMENT Left 07/13/2016   Procedure: INSERTION PORT-A-CATH left subclavian;  Surgeon: [name], MD;  [facility];  Service: General;  Laterality: Left;  . SHOULDER ACROMIOPLASTY Right 12/20/2014   Procedure: SHOULDER ACROMIOPLASTY;  Surgeon: Melrose Nakayama, MD;  Location: [place];  Service: Orthopedics;  Laterality: Right;  . SHOULDER ARTHROSCOPY Right 12/20/2014   Procedure: RIGHT ARTHROSCOPY SHOULDER WITH DEBRIDEMENT;  Surgeon: Melrose Nakayama, MD;  Location: [place];  Service: Orthopedics;  Laterality: Right;  . TESTICLE SURGERY     as teen  . TONSILLECTOMY    . WISDOM TOOTH EXTRACTION       IV Location/Drains/Wounds Patient Lines/Drains/Airways Status   Active Line/Drains/Airways    Name:   Placement date:   Placement time:   Site:   Days:   Implanted Port 09/30/16 Right Chest   09/30/16    1050    Chest   687   Implanted Port 12/23/17 Right Chest   12/23/17    0447    Chest   238    Airway   07/13/16    1314     766   Incision (Closed) 06/17/16 Abdomen Other (Comment)   06/17/16    1054     792   Incision (Closed) 07/13/16 Neck   07/13/16    1408     766   Incision - 3 Ports Abdomen Left Right;Upper Right;Lower   06/17/16    0930     792          Intake/Output Last 24 hours  Intake/Output Summary (Last 24 hours) at 08/18/2018 0145 Last data filed at 08/18/2018 0115 Gross per 24 hour  Intake 879.9 ml  Output -  Net 879.9 ml    Labs/Imaging Results for orders placed or performed during the hospital encounter of 08/17/18 (from the past 48 hour(s))  Comprehensive metabolic panel     Status: Abnormal   Collection Time: 08/17/18 10:54 PM  Result Value Ref Range   Sodium 135 135 - 145 mmol/L   Potassium 4.2 3.5 - 5.1 mmol/L   Chloride 100 98 - 111 mmol/L   CO2 25 22 - 32 mmol/L   Glucose, Bld 133 (H) 70 - 99 mg/dL   BUN 16 6 - 20 mg/dL   Creatinine, Ser 0.88 0.61 - 1.24 mg/dL   Calcium 8.7 (L) 8.9 - 10.3 mg/dL   Total Protein 6.5 6.5 - 8.1 g/dL   Albumin 3.6 3.5 - 5.0 g/dL   AST 23 15 - 41 U/L   ALT 18 0 - 44 U/L   Alkaline Phosphatase 105 38 - 126 U/L   Total Bilirubin 1.2 0.3 - 1.2 mg/dL   GFR calc non Af Amer >60 >60 mL/min   GFR calc Af Amer >60 >60 mL/min   Anion gap 10 5 - 15    Comment: Performed at [facility], [place] [address], [place], [place] [address]  CBC with Differential     Status: Abnormal   Collection Time: 08/17/18 10:54 PM  Result Value Ref Range   WBC 11.9 (H) 4.0 - 10.5 K/uL   RBC 4.53 4.22 - 5.81 MIL/uL   Hemoglobin 11.5 (L) 13.0 - 17.0 g/dL   HCT 38.4 (L) 39.0 - 52.0 %   MCV 84.8 80.0 - 100.0 fL   MCH 25.4 (L) 26.0 - 34.0 pg   MCHC 29.9 (L) 30.0 - 36.0 g/dL   RDW 17.2 (H) 11.5 - 15.5 %   Platelets 285 150 - 400 K/uL   nRBC 0.0 0.0 - 0.2 %   Neutrophils Relative % 82 %   Neutro Abs 9.8 (H) 1.7 - 7.7 K/uL   Lymphocytes Relative 6 %   Lymphs Abs 0.7 0.7 - 4.0 K/uL   Monocytes Relative 10 %   Monocytes  Absolute 1.1 (H) 0.1 - 1.0 K/uL   Eosinophils Relative 1 %   Eosinophils Absolute 0.1 0.0 -  0.5 K/uL   Basophils Relative 1 %   Basophils Absolute 0.1 0.0 - 0.1 K/uL   Immature Granulocytes 0 %   Abs Immature Granulocytes 0.05 0.00 - 0.07 K/uL    Comment: Performed at [facility], [place] [address], [place], [place] [address]  I-Stat CG4 Lactic Acid, ED     Status: None   Collection Time: 08/17/18 10:58 PM  Result Value Ref Range   Lactic Acid, Venous 1.86 0.5 - 1.9 mmol/L   Dg Chest 2 View  Result Date: 08/17/2018 CLINICAL DATA:  Colon cancer. Fever. EXAM: CHEST - 2 VIEW COMPARISON:  12/23/2017 chest radiograph. FINDINGS: Right internal jugular Port-A-Cath terminates in middle third of the SVC. Stable cardiomediastinal silhouette with normal heart size. No pneumothorax. No pleural effusion. Mild elevation of the left hemidiaphragm. No pulmonary edema. Hazy posterior lung base opacity on the lateral view, favor basilar right lower lobe opacity. IMPRESSION: Hazy posterior lung base opacity on the lateral view, favor basilar right lower lobe opacity, which could represent atelectasis or pneumonia. Follow-up chest radiographs advised. Electronically Signed   By: Ilona Sorrel M.D.   On: 08/17/2018 22:50    Pending Labs Unresulted Labs (From admission, onward)    Start     Ordered   08/18/18 0500  CBC  Tomorrow morning,   R     08/18/18 0112   08/18/18 0111  HIV antibody (Routine Testing)  Once,   R     08/18/18 0112   08/17/18 2258  Culture, blood (routine x 2)  BLOOD CULTURE X 2,   STAT     08/17/18 2257   08/17/18 2237  Respiratory Panel by PCR  (Respiratory virus panel with precautions)  Once,   R     08/17/18 2236   08/17/18 2236  Urine culture  ONCE - STAT,   STAT    Question:  Patient immune status  Answer:  Normal   08/17/18 2236   08/17/18 2219  Urinalysis, Routine w reflex microscopic  ONCE - STAT,   STAT     08/17/18 2218          Vitals/Pain Today's  Vitals   08/18/18 0000 08/18/18 0049 08/18/18 0051 08/18/18 0100  BP: 121/82   104/68  Pulse: (!) 115   100  Resp: 15   11  Temp:   98.5 F (36.9 C)   TempSrc:   Oral   SpO2: 93%   92%  Weight:      Height:      PainSc:  4       Isolation Precautions Droplet precaution  Medications Medications  morphine 2 MG/ML injection 1 mg (has no administration in time range)  LORazepam (ATIVAN) tablet 0.5-1 mg (has no administration in time range)  ondansetron (ZOFRAN) injection 4 mg (has no administration in time range)  Melatonin TABS 10 mg (has no administration in time range)  enoxaparin (LOVENOX) injection 40 mg (has no administration in time range)  acetaminophen (TYLENOL) tablet 650 mg (has no administration in time range)    Or  acetaminophen (TYLENOL) suppository 650 mg (has no administration in time range)  0.9 %  sodium chloride infusion (has no administration in time range)  metroNIDAZOLE (FLAGYL) IVPB 500 mg (has no administration in time range)  ceFEPIme (MAXIPIME) 2 g in sodium chloride 0.9 % 100 mL IVPB (has no administration in time range)  ceFEPIme (MAXIPIME) 2 g in sodium chloride 0.9 % 100 mL IVPB ( Intravenous Stopped 08/18/18 0001)  acetaminophen (TYLENOL) tablet 1,000 mg (1,000 mg Oral Given 08/17/18 2325)  lactated ringers bolus 1,000 mL (0 mLs Intravenous Stopped 08/18/18 0026)    Mobility walks Low fall risk   Focused Assessments   Recommendations: See Admitting Provider Note  Report given to:   Additional Notes:

## 2018-08-18 NOTE — Evaluation (Signed)
Physical Therapy One Time Evaluation Patient Details Name: Scott Gallagher MRN: [id number] DOB: 1970/11/22 Today's Date: 08/18/2018   History of Present Illness  48 y.o. 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 All education has been completed and the patient has no further questions.  Pt mobilizing well and encouraged OOB and ambulating during acute stay.  SPO2 94% on room air after ambulating.  See below for any follow-up Physical Therapy or equipment needs. PT is signing off. Thank you for this referral.   Follow Up Recommendations No PT follow up    Equipment Recommendations  None recommended by PT    Recommendations for Other Services       Precautions / Restrictions Precautions Precautions: None      Mobility  Bed Mobility Overal bed mobility: Modified Independent             General bed mobility comments: SpO2 97% room air at rest  Transfers Overall transfer level: Needs assistance Equipment used: None Transfers: Sit to/from Stand Sit to Stand: Supervision            Ambulation/Gait Ambulation/Gait assistance: Supervision Gait Distance (Feet): 180 Feet Assistive device: IV Pole Gait Pattern/deviations: Step-through pattern     General Gait Details: pt pushed IV pole however not needed for support, pt mobilizing well, SPO2 94% on room air upon returning to room  Stairs            Wheelchair Mobility    Modified Rankin (Stroke Patients Only)       Balance Overall balance assessment: No apparent balance deficits (not formally assessed)                                           Pertinent Vitals/Pain Pain Assessment: No/denies pain     Home Living Family/patient expects to be discharged to:: Private residence Living Arrangements: Spouse/significant other   Type of Home: House       Home Layout: One level Home Equipment: None      Prior Function Level of Independence: Independent               Hand Dominance        Extremity/Trunk Assessment        Lower Extremity Assessment Lower Extremity Assessment: Overall WFL for tasks assessed;LLE deficits/detail;RLE deficits/detail RLE Sensation: history of peripheral neuropathy LLE Sensation: history of peripheral neuropathy       Communication   Communication: No difficulties  Cognition Arousal/Alertness: Awake/alert Behavior During Therapy: WFL for tasks assessed/performed Overall Cognitive Status: Within Functional Limits for tasks assessed                                        General Comments      Exercises     Assessment/Plan    PT Assessment Patent does not need any further PT services  PT Problem List         PT Treatment Interventions      PT Goals (Current goals can be found in the Care Plan section)  Acute Rehab PT  Goals PT Goal Formulation: All assessment and education complete, DC therapy    Frequency     Barriers to discharge        Co-evaluation               AM-PAC PT "6 Clicks" Mobility  Outcome Measure Help needed turning from your back to your side while in a flat bed without using bedrails?: None Help needed moving from lying on your back to sitting on the side of a flat bed without using bedrails?: None Help needed moving to and from a bed to a chair (including a wheelchair)?: None Help needed standing up from a chair using your arms (e.g., wheelchair or bedside chair)?: None Help needed to walk in hospital room?: None Help needed climbing 3-5 steps with a railing? : A Little 6 Click Score: 23    End of Session   Activity Tolerance: Patient tolerated treatment well Patient  left: in chair;with call bell/phone within reach;with family/visitor present;with nursing/sitter in room Nurse Communication: Mobility status PT Visit Diagnosis: Difficulty in walking, not elsewhere classified (R26.2)    Time: 5726-2035 PT Time Calculation (min) (ACUTE ONLY): 10 min   Charges:   PT Evaluation $PT Eval Low Complexity: [place], PT, DPT Acute Rehabilitation Services Office: [phone] Pager: [phone]  Trena Platt 08/18/2018, 12:10 PM

## 2018-08-18 NOTE — Progress Notes (Signed)
[facility] pt to check in following hospital admission. The pt and counselor cancelled session for today. The counselor invited the pt to call to set up an appt for counseling after hospital stay. The pt was agreeable to this and reported that he will call to schedule an appt when he is feeling up to it.  Doris Cheadle, Counseling Intern [phone]

## 2018-08-18 NOTE — Progress Notes (Signed)
PROGRESS NOTE   Scott Gallagher  PPI:[id number]    DOB: 1971/01/04    DOA: 08/17/2018  PCP: [name], MD   I have briefly reviewed patients previous medical records in [facility].  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 ED on 08/17/2018.  He has chronic intermittent nausea due to chemotherapy and had nonbloody emesis a few days ago, associated poor oral intake, chronic abdominal pain which has lately worsened, last BM 6 days PTA, not fully compliant with bowel regimen.  Admitted for sepsis due to aspiration pneumonia and abdominal pain likely related to peritoneal carcinomatosis without SBO on imaging.   Assessment & Plan:   Principal Problem:   Aspiration pneumonia ([place]) Active Problems:   Diabetes mellitus type 2, diet-controlled (HCC)   OSA (obstructive sleep apnea)   Metastatic colon cancer to liver (HCC)   Sepsis (HCC)   Abdominal pain   Nausea & vomiting   Chronic anemia   Physical deconditioning   Anxiety   1. Sepsis due to RLL aspiration pneumonia: No history of dysphagia.  Likely related to recent chemo related nausea and emesis.  Patient and spouse declined speech therapy swallow evaluation.  Met sepsis criteria on admission including fever, tachycardia, leukocytosis.  Lactate normal and not hypoxic.  CT abdomen and pelvis and chest x-ray did show mild airspace opacities in the right lung base concerning for pneumonia.  Patient allergic to penicillin and hence was placed on cefepime and metronidazole, continue.  Recommend repeating chest x-ray in 4 weeks to ensure  resolution of pneumonia findings.  RSV panel negative.  Blood cultures pending.  Leukocytosis resolved.  Sepsis physiology resolved. 2. Abdominal pain, nausea and vomiting: Patient reports chronic intermittent nausea and recent nonbloody emesis.  CT abdomen does not show obstruction.  It does show mild to moderate ascites concerning for peritoneal carcinomatosis, worsening hepatic metastasis and possible pulmonary metastasis.  Aggressive bowel regimen, antiemetics and diet as tolerated. 3. Constipation: Related to poor oral intake, immobility, opioids.  Aggressive bowel regimen and advised compliance.  Verbalized understanding. 4. 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  Pulmonary metastasis, status post right hemicolectomy: Followed by Dr. Irene Limbo, oncology.  Last office visit August 04, 2018 and supposed to have another visit today.  Alerted his oncologist via epic.  Outpatient follow-up. 5. Anemia of chronic disease/malignancy and chemotherapy: Stable. 6. Diet controlled DM 2: A1c 5.7. 7. Chronic pain: Continue home regimen. 8. OSA on CPAP. 9. Anxiety: Continue home Ativan as needed.   DVT prophylaxis: Lovenox Code Status: Full Family Communication: Discussed in detail with patient's spouse at bedside, updated care and answered questions. Disposition: DC home pending clinical improvement, possibly in 48 to 72 hours.   Consultants:  None  Procedures:  None  Antimicrobials:  IV cefepime and Flagyl   Subjective: Feels slightly better this morning.  Less cough.  No fevers.  Constipation with last BM approximately 6 days ago.  Passing flatus.  Not fully compliant with bowel regimen at home.  Abdomen slightly distended.  Wants to start back regular diet.  Willing to try Dulcolax suppository.  No  chest pain or dyspnea.  ROS: As above.  Objective:  Vitals:   08/18/18 0100 08/18/18 0213 08/18/18 0235 08/18/18 0618  BP: 104/68 112/77  118/78  Pulse:  100 97  92  Resp: _0 Temp:  98.8 F (37.1 C)  97.8 F (36.6 C)  TempSrc:  Oral  Oral  SpO2: 92% 97%  95%  Weight:   100 kg   Height:   _1  (1.88 m)     Examination:  General exam: Pleasant young male, moderately built and nourished sitting up comfortably in bed.  Oral mucosa slightly dry Respiratory system: Few basal crackles, right >left.  Rest of lung fields clear to auscultation.  No increased work of breathing.  Respiratory effort normal.  Right sided Port-A-Cath Cardiovascular system: S1 & S2 heard, RRR. No JVD, murmurs, rubs, gallops or clicks. No pedal edema.  Telemetry personally reviewed: Sinus rhythm. Gastrointestinal system: Abdomen is mildly distended, soft and nontender.  Midline laparotomy scar. No organomegaly or masses felt. Normal bowel sounds heard. Central nervous system: Alert and oriented. No focal neurological deficits. Extremities: Symmetric 5 x 5 power. Skin: No rashes, lesions or ulcers Psychiatry: Judgement and insight appear normal. Mood & affect flat.     Data Reviewed: I have personally reviewed following labs and imaging studies  CBC: Recent Labs  Lab 08/17/18 2254 08/18/18 0412  WBC 11.9* 9.6  NEUTROABS 9.8*  --   HGB 11.5* 11.0*  HCT 38.4* 37.3*  MCV 84.8 85.6  PLT 285 536   Basic Metabolic Panel: Recent Labs  Lab 08/17/18 2254  NA 135  K 4.2  CL 100  CO2 25  GLUCOSE 133*  BUN 16  CREATININE 0.88  CALCIUM 8.7*   Liver Function Tests: Recent Labs  Lab 08/17/18 2254  AST 23  ALT 18  ALKPHOS 105  BILITOT 1.2  PROT 6.5  ALBUMIN 3.6   HbA1C: Recent Labs    08/18/18 0412  HGBA1C 5.7*   CBG: No results for input(s): GLUCAP in the last 168 hours.  Recent Results (from the past 240 hour(s))  Culture, blood (routine x 2)     Status: None (Preliminary result)   Collection Time: 08/17/18 10:50 PM  Result Value Ref Range Status   Specimen Description   Final    BLOOD PORTA CATH Performed at [facility], [address], [place], [place] [address]    Special Requests   Final    BOTTLES DRAWN AEROBIC AND ANAEROBIC Blood Culture adequate volume Performed at [facility] [address] [place], [place] [address]    Culture PENDING  Incomplete   Report Status PENDING  Incomplete  Respiratory Panel by PCR     Status: None   Collection Time: 08/18/18 12:09 AM  Result Value Ref Range Status   Adenovirus NOT DETECTED NOT DETECTED Final   Coronavirus 229E NOT DETECTED NOT DETECTED Final   Coronavirus HKU1 NOT DETECTED NOT DETECTED Final   Coronavirus NL63 NOT DETECTED NOT DETECTED Final   Coronavirus OC43 NOT DETECTED NOT DETECTED Final   Metapneumovirus NOT DETECTED NOT DETECTED Final   Rhinovirus / Enterovirus NOT DETECTED NOT DETECTED Final   Influenza A NOT DETECTED NOT DETECTED Final   Influenza B NOT DETECTED NOT DETECTED Final   Parainfluenza Virus 1 NOT DETECTED NOT DETECTED Final   Parainfluenza Virus 2 NOT DETECTED NOT DETECTED Final   Parainfluenza Virus 3 NOT DETECTED NOT DETECTED Final   Parainfluenza Virus 4  NOT DETECTED NOT DETECTED Final   Respiratory Syncytial Virus NOT DETECTED NOT DETECTED Final   Bordetella pertussis NOT DETECTED NOT DETECTED Final   Chlamydophila pneumoniae NOT DETECTED NOT DETECTED Final   Mycoplasma pneumoniae NOT DETECTED NOT DETECTED Final    Comment: Performed at [facility], [place] [address] [place], [place] [address]         Radiology Studies: Ct Abdomen Pelvis Wo Contrast  Result Date: 08/18/2018 CLINICAL DATA:  Acute onset of fever, nausea and vomiting. Constipation. On chemotherapy for colon cancer with liver metastases. EXAM: CT ABDOMEN AND PELVIS WITHOUT CONTRAST TECHNIQUE: Multidetector CT imaging of the abdomen and pelvis was performed following the standard protocol without IV contrast. COMPARISON:  CT of the abdomen and pelvis performed 06/12/2018 FINDINGS: Lower chest: Mild patchy airspace opacities at the  right lung base raise concern for pneumonia. Mild left basilar atelectasis is noted. Scattered small nodules at the right lung base measure up to 9 mm in size. Metastatic disease cannot be excluded. The visualized portions of the mediastinum are unremarkable. Hepatobiliary: Diffuse peripheral metastatic lesions are noted throughout the liver, more prominent than in October. These are difficult to fully characterize without contrast. High attenuation within the gallbladder may reflect residual contrast, as on the prior study. The common bile duct stent is noted in expected position. Pancreas: The pancreas is within normal limits. Spleen: The spleen is unremarkable in appearance. Adrenals/Urinary Tract: The adrenal glands are unremarkable in appearance. A small left renal cyst is noted. Nonobstructing left renal stones measure up to 6 mm in size. Nonspecific perinephric stranding is noted bilaterally. There is no evidence of hydronephrosis. No obstructing ureteral stones are identified. Stomach/Bowel: The patient is status post right-sided hemicolectomy. The ileocolic anastomosis is grossly unremarkable in appearance the remaining colon is unremarkable in appearance. The small bowel is grossly unremarkable, though fluid is seen tracking about small bowel loops. The stomach is decompressed and grossly unremarkable in appearance. Vascular/Lymphatic: Scattered calcification is seen along the abdominal aorta and its branches. The abdominal aorta is otherwise grossly unremarkable. The inferior vena cava is grossly unremarkable. No retroperitoneal lymphadenopathy is seen. No pelvic sidewall lymphadenopathy is identified. Reproductive: The bladder is mildly distended and grossly unremarkable. The prostate is normal in size. Other: Small to moderate volume ascites is noted within the abdomen and pelvis, new from the prior study. Underlying peritoneal carcinomatosis is suspected. Musculoskeletal: No acute osseous abnormalities  are identified. The visualized musculature is unremarkable in appearance. IMPRESSION: 1. Mild patchy airspace opacities at the right lung base raise concern for pneumonia. 2. Small to moderate volume ascites within the abdomen and pelvis, new from the prior study. Underlying peritoneal carcinomatosis suspected. 3. Diffuse peripheral metastatic lesions throughout the liver, more prominent than in October. These are difficult to fully characterize without contrast. 4. Nonobstructing left renal stones measure up to 6 mm in size. Small left renal cyst. 5. Small nodules at the right lung base measure up to 9 mm in size. Metastatic disease cannot be excluded. Aortic Atherosclerosis (ICD10-I70.0). Electronically Signed   By: Garald Balding M.D.   On: 08/18/2018 05:33   Dg Chest 2 View  Result Date: 08/17/2018 CLINICAL DATA:  Colon cancer. Fever. EXAM: CHEST - 2 VIEW COMPARISON:  12/23/2017 chest radiograph. FINDINGS: Right internal jugular Port-A-Cath terminates in middle third of the SVC. Stable cardiomediastinal silhouette with normal heart size. No pneumothorax. No pleural effusion. Mild elevation of the left hemidiaphragm. No pulmonary edema. Hazy posterior lung base opacity on  the lateral view, favor basilar right lower lobe opacity. IMPRESSION: Hazy posterior lung base opacity on the lateral view, favor basilar right lower lobe opacity, which could represent atelectasis or pneumonia. Follow-up chest radiographs advised. Electronically Signed   By: Ilona Sorrel M.D.   On: 08/17/2018 22:50        Scheduled Meds: . dronabinol  5 mg Oral BID AC  . enoxaparin (LOVENOX) injection  40 mg Subcutaneous Q24H  . [START ON 08/20/2018] fentaNYL  75 mcg Transdermal Q72H  . polyethylene glycol  17 g Oral BID  . senna-docusate  2 tablet Oral BID   Continuous Infusions: . sodium chloride 100 mL/hr at 08/18/18 1102  . ceFEPime (MAXIPIME) IV 2 g (08/18/18 0521)  . metronidazole 500 mg (08/18/18 0859)     LOS: 0  days     Vernell Leep, MD, FACP, [facility]. [facility] Pager [phone]  If 7PM-7AM, please contact night-coverage [email or web address] Password [facility] 08/18/2018, 12:29 PM

## 2018-08-18 NOTE — ED Notes (Signed)
Pt aware of need for urine sample but is unable to provide at this time.

## 2018-08-18 NOTE — Progress Notes (Signed)
Pharmacy Antibiotic Note  Scott Gallagher is a 48 y.o. male admitted on 08/17/2018 with pneumonia.  Pharmacy has been consulted for Cefepime dosing.  Plan: Cefepime 2gm iv q8hr  Height: 6\' 2"  (188 cm) Weight: 222 lb (100.7 kg) IBW/kg (Calculated) : 82.2  Temp (24hrs), Avg:99 F (37.2 C), Min:98.5 F (36.9 C), Max:99.5 F (37.5 C)  Recent Labs  Lab 08/17/18 2254 08/17/18 2258  WBC 11.9*  --   CREATININE 0.88  --   LATICACIDVEN  --  1.86    Estimated Creatinine Clearance: 130.1 mL/min (by C-G formula based on SCr of 0.88 mg/dL).    Allergies  Allergen Reactions  . Penicillins     Was told as a child he was allergic does not know of type of reaction.   Has patient had a PCN reaction causing immediate rash, facial/tongue/throat swelling, SOB or lightheadedness with hypotension: N Has patient had a PCN reaction causing severe rash involving mucus membranes or skin necrosis: N Has patient had a PCN reaction that required hospitalization: N Has patient had a PCN reaction occurring within the last 10 years: N If all of the above answers are "NO", then may proceed with Cephalospo    Antimicrobials this admission: Cefepime 08/18/2018 >>  Dose adjustments this admission: -  Microbiology results: -  Thank you for allowing pharmacy to be a part of this patient's care.  Nani Skillern [place]ford 08/18/2018 1:37 AM

## 2018-08-19 DIAGNOSIS — J690 Pneumonitis due to inhalation of food and vomit: Secondary | ICD-10-CM | POA: Diagnosis not present

## 2018-08-19 DIAGNOSIS — C189 Malignant neoplasm of colon, unspecified: Secondary | ICD-10-CM

## 2018-08-19 DIAGNOSIS — G4733 Obstructive sleep apnea (adult) (pediatric): Secondary | ICD-10-CM | POA: Diagnosis not present

## 2018-08-19 DIAGNOSIS — R627 Adult failure to thrive: Secondary | ICD-10-CM

## 2018-08-19 DIAGNOSIS — E119 Type 2 diabetes mellitus without complications: Secondary | ICD-10-CM

## 2018-08-19 DIAGNOSIS — J181 Lobar pneumonia, unspecified organism: Secondary | ICD-10-CM | POA: Diagnosis not present

## 2018-08-19 DIAGNOSIS — C787 Secondary malignant neoplasm of liver and intrahepatic bile duct: Secondary | ICD-10-CM

## 2018-08-19 DIAGNOSIS — R180 Malignant ascites: Secondary | ICD-10-CM | POA: Diagnosis not present

## 2018-08-19 LAB — URINE CULTURE
Culture: NO GROWTH
Special Requests: NORMAL

## 2018-08-19 MED ORDER — ADULT MULTIVITAMIN W/MINERALS CH
1.0000 | ORAL_TABLET | Freq: Every day | ORAL | Status: DC
  Administered 2018-08-20: 1 via ORAL
  Filled 2018-08-19: qty 1

## 2018-08-19 MED ORDER — ENSURE ENLIVE PO LIQD
237.0000 mL | Freq: Two times a day (BID) | ORAL | Status: DC
  Administered 2018-08-19 – 2018-08-20 (×2): 237 mL via ORAL

## 2018-08-19 NOTE — Progress Notes (Signed)
PROGRESS NOTE  Scott Gallagher LGX:[id number] DOB: 04-26-1971 DOA: 08/17/2018 PCP: [name], MD  HPI/Recap of past 24 hours:  Sitting up in chair, reports ab more distended today, some nausea, poor appetite, remains on ivf  Denies cough, denies difficulty swallowing, no chest pain, no hypoxia  Report has bm yesterday Wife at bedside  Assessment/Plan: Principal Problem:   Aspiration pneumonia ([place]) Active Problems:   Diabetes mellitus type 2, diet-controlled ([place])   OSA (obstructive sleep apnea)   Metastatic colon cancer to liver (HCC)   Sepsis ([place])   Abdominal pain   Nausea & vomiting   Chronic anemia   Physical deconditioning   Anxiety  1. Sepsis due to RLL aspiration pneumonia: No history of dysphagia.  Likely related to recent chemo related nausea and emesis.  Patient and spouse declined speech therapy swallow evaluation.  Met sepsis criteria on admission including fever, tachycardia, leukocytosis.  Lactate normal and not hypoxic.  CT abdomen and pelvis and chest x-ray did show mild airspace opacities in the right lung base concerning for pneumonia.  Patient allergic to penicillin and hence was placed on cefepime and metronidazole, continue.  Recommend repeating chest x-ray in 4 weeks to ensure resolution of pneumonia findings.  RSV panel negative.  Blood cultures pending.  Leukocytosis resolved.  Sepsis physiology resolved. 2. Abdominal distension: + constipation, + ascites,  3. 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  It does show mild to moderate ascites concerning for peritoneal carcinomatosis, worsening hepatic metastasis and possible pulmonary metastasis  4. OSA on CPAP. 5. FTT: frail, will get PT eval  Code Status: full  Family Communication: patient and wife at bedside  Disposition Plan: pending clinical improvement, oral intake, PT  eval   Consultants:  none  Procedures:  none  Antibiotics:  As above   Objective: BP 126/74 (BP Location: Left Arm)   Pulse 92   Temp 99.3 F (37.4 C) (Oral)   Resp 16   Ht 6' 2"  (1.88 m)   Wt 100 kg   SpO2 93%   BMI 28.31 kg/m   Intake/Output Summary (Last 24 hours) at 08/19/2018 1248 Last data filed at 08/19/2018 0700 Gross per 24 hour  Intake 2431.83 ml  Output -  Net 2431.83 ml   Filed Weights   08/17/18 2215 08/18/18 0235  Weight: 100.7 kg 100 kg    Exam: Patient is examined daily including today on 08/19/2018, exams remain the same as of yesterday except that has changed    General:  Pale, frail, chronically ill appearing, NAD  Cardiovascular: RRR  Respiratory: diminished at basis, no wheezing, no rales, no rhonchi  Abdomen: abdomen tight and distended , NT, positive BS  Musculoskeletal: No Edema  Neuro: alert, oriented   Data Reviewed: Basic Metabolic Panel: Recent Labs  Lab 08/17/18 2254  NA 135  K 4.2  CL 100  CO2 25  GLUCOSE 133*  BUN 16  CREATININE 0.88  CALCIUM 8.7*   Liver Function Tests: Recent Labs  Lab 08/17/18 2254  AST 23  ALT 18  ALKPHOS 105  BILITOT 1.2  PROT 6.5  ALBUMIN 3.6   No results for input(s): LIPASE, AMYLASE in the last 168 hours. No results for input(s): AMMONIA in the last 168 hours. CBC: Recent Labs  Lab 08/17/18 2254 08/18/18 0412  WBC 11.9* 9.6  NEUTROABS 9.8*  --   HGB 11.5* 11.0*  HCT  38.4* 37.3*  MCV 84.8 85.6  PLT 285 230   Cardiac Enzymes:   No results for input(s): CKTOTAL, CKMB, CKMBINDEX, TROPONINI in the last 168 hours. BNP (last 3 results) No results for input(s): BNP in the last 8760 hours.  ProBNP (last 3 results) No results for input(s): PROBNP in the last 8760 hours.  CBG: No results for input(s): GLUCAP in the last 168 hours.  Recent Results (from the past 240 hour(s))  Culture, blood (routine x 2)     Status: None (Preliminary result)   Collection Time: 08/17/18  10:50 PM  Result Value Ref Range Status   Specimen Description   Final    BLOOD PORTA CATH Performed at [facility], [address], [place], [place] [address]    Special Requests   Final    BOTTLES DRAWN AEROBIC AND ANAEROBIC Blood Culture adequate volume Performed at [facility] [address] [place], [place] [address]    Culture   Final    NO GROWTH 1 DAY Performed at [facility], [place] [address], [place], [place] [address]    Report Status PENDING  Incomplete  Culture, blood (routine x 2)     Status: None (Preliminary result)   Collection Time: 08/17/18 11:21 PM  Result Value Ref Range Status   Specimen Description   Final    BLOOD RIGHT ARM Performed at [facility] [address] [place], [place] [address]    Special Requests   Final    BOTTLES DRAWN AEROBIC AND ANAEROBIC Blood Culture adequate volume Performed at [facility] [address] [place], [place] [address]    Culture   Final    NO GROWTH 1 DAY Performed at [facility], [place] [address] [place], [place] [address]    Report Status PENDING  Incomplete  Respiratory Panel by PCR     Status: None   Collection Time: 08/18/18 12:09 AM  Result Value Ref Range Status   Adenovirus NOT DETECTED NOT DETECTED Final   Coronavirus 229E NOT DETECTED NOT DETECTED Final   Coronavirus HKU1 NOT DETECTED NOT DETECTED Final   Coronavirus NL63 NOT DETECTED NOT DETECTED Final   Coronavirus OC43 NOT DETECTED NOT DETECTED Final   Metapneumovirus NOT DETECTED NOT DETECTED Final   Rhinovirus / Enterovirus NOT DETECTED NOT DETECTED Final   Influenza A NOT DETECTED NOT DETECTED Final   Influenza B NOT DETECTED NOT DETECTED Final   Parainfluenza Virus 1 NOT DETECTED NOT DETECTED Final   Parainfluenza Virus 2 NOT DETECTED NOT DETECTED Final   Parainfluenza Virus 3 NOT DETECTED NOT DETECTED Final   Parainfluenza Virus 4 NOT DETECTED NOT DETECTED Final    Respiratory Syncytial Virus NOT DETECTED NOT DETECTED Final   Bordetella pertussis NOT DETECTED NOT DETECTED Final   Chlamydophila pneumoniae NOT DETECTED NOT DETECTED Final   Mycoplasma pneumoniae NOT DETECTED NOT DETECTED Final    Comment: Performed at [facility], [place] [address] [place], [place] [address]  Urine culture     Status: None   Collection Time: 08/18/18  4:50 AM  Result Value Ref Range Status   Specimen Description   Final    URINE, CLEAN CATCH Performed at [facility], [place] [address] [place], [place] [address]    Special Requests   Final    Normal Performed at [facility], [place] [address] [place], [place] [address]    Culture  Final    NO GROWTH Performed at [facility], [place] [address] [place], [place] [address]    Report Status 08/19/2018 FINAL  Final     Studies: No results found.  Scheduled Meds: . dronabinol  5 mg Oral BID AC  . enoxaparin (LOVENOX) injection  40 mg Subcutaneous Q24H  . [START ON 08/20/2018] fentaNYL  75 mcg Transdermal Q72H  . polyethylene glycol  17 g Oral BID  . senna-docusate  2 tablet Oral BID    Continuous Infusions: . ceFEPime (MAXIPIME) IV 2 g (08/19/18 5396)  . metronidazole 500 mg (08/19/18 0800)     Time spent: 28mns I have personally reviewed and interpreted on  08/19/2018 daily labs, tele strips, imagings as discussed above under date review session and assessment and plans.  I reviewed all nursing notes, pharmacy notes, consultant notes,  vitals, pertinent old records  I have discussed plan of care as described above with RN , patient and family on 08/19/2018   FFlorencia ReasonsMD, PhD  [facility] Pager [phone] If 7PM-7AM, please contact night-coverage at [email or web address], password [email or web address]1/10/2018, 12:48 PM  LOS: 0 days

## 2018-08-19 NOTE — Progress Notes (Signed)
Pharmacy Antibiotic Note  Scott Gallagher is a 48 y.o. male admitted on 08/17/2018 with pneumonia.  Pharmacy has been consulted for Cefepime dosing.  Today, 08/19/2018:  Afebrile  WBC not rechecked recently  SCr stable WNL  Cx remain clear  Plan:  Will adjust Cefepime to 1g IV q8 hr without neutropenia or Hx pesudomonas  Given stable renal function, Pharmacy will sign off, following peripherally for Cx results, renal adjustments, and LOT  Height: 6\' 2"  (188 cm) Weight: 220 lb 7.4 oz (100 kg) IBW/kg (Calculated) : 82.2  Temp (24hrs), Avg:99.2 F (37.3 C), Min:98.8 F (37.1 C), Max:99.4 F (37.4 C)  Recent Labs  Lab 08/17/18 2254 08/17/18 2258 08/18/18 0412  WBC 11.9*  --  9.6  CREATININE 0.88  --   --   LATICACIDVEN  --  1.86  --     Estimated Creatinine Clearance: 129.7 mL/min (by C-G formula based on SCr of 0.88 mg/dL).    Allergies  Allergen Reactions  . Penicillins     Was told as a child he was allergic does not know of type of reaction.   Has patient had a PCN reaction causing immediate rash, facial/tongue/throat swelling, SOB or lightheadedness with hypotension: N Has patient had a PCN reaction causing severe rash involving mucus membranes or skin necrosis: N Has patient had a PCN reaction that required hospitalization: N Has patient had a PCN reaction occurring within the last 10 years: N If all of the above answers are "NO", then may proceed with Cephalospo    Antimicrobials this admission:  1/2 Cefepime >> 1/2 Metronidazole >>   Dose adjustments this admission:  - 1/3 Cefepime to 1g q8  Microbiology results:  1/1 BCx (from [facility]):  ngtd 1/1 BCx (R arm): ngtd 1/2 UCx:  NGF 1/2 Respiratory panel: none detected  Thank you for allowing pharmacy to be a part of this patient's care.   Reuel Boom, PharmD, BCPS [phone] 08/19/2018, 12:23 PM

## 2018-08-19 NOTE — Progress Notes (Signed)
Initial Nutrition Assessment  DOCUMENTATION CODES:   Severe malnutrition in context of chronic illness  INTERVENTION:    Ensure Enlive po BID, each supplement provides 350 kcal and 20 grams of protein  Magic cup TID with meals, each supplement provides 290 kcal and 9 grams of protein  Provide MVI daily  NUTRITION DIAGNOSIS:   Severe Malnutrition related to cancer and cancer related treatments, chronic illness as evidenced by energy intake < or equal to 75% for > or equal to 1 month, percent weight loss, mild fat depletion, mild muscle depletion, moderate muscle depletion.  GOAL:   Patient will meet greater than or equal to 90% of their needs  MONITOR:   PO intake, Supplement acceptance, Weight trends, Labs  REASON FOR ASSESSMENT:   Malnutrition Screening Tool    ASSESSMENT:   Patient with PMH significant for DM, HLD, and stage IV invasive adenocarcinoma of the colon with peritoneal/iver metastasis s/p right hemicolectomy currently on chemotherapy. Presents this admission with sepsis secondary to aspiration PNA and concern of bowel obstruction.    CT scan shows no obstruction, pt's diet advanced to regular.   Pt endorses being on/off chemo for two years but mostly recently started back at the beginning of November. States due to the change in chemo drug his appetite has steadily worsened. He complains of taste changed and early satiety. Pt states along with chemo side effects his consitpation makes it even harder to eat. Over the last month he has tolerated bites at each meal and 1/2 an Ensure shake daily.  He is being followed by the [place] [facility]. They discussed increasing meals and snacks to 3 each daily and to add Ensure shakes. Pt reports he is trying interventions but it has been tough.   He attempted to drink coke this morning but cannot tolerate the taste.  Pt reports a UBW of 270 lb and a weight loss of 50 lb. Records indicate pt weighed 259 lb on 02/16/18 and 220  lb this admission (15% wt loss in 6 months, significant for time frame).   Medications reviewed and include: 5 mg marinol BID, miralax, senokot Labs reviewed.   NUTRITION - FOCUSED PHYSICAL EXAM:    Most Recent Value  Orbital Region  Mild depletion  Upper Arm Region  Moderate depletion  Thoracic and Lumbar Region  Unable to assess  Buccal Region  Mild depletion  Temple Region  Mild depletion  Clavicle Bone Region  Moderate depletion  Clavicle and Acromion Bone Region  Moderate depletion  Scapular Bone Region  Unable to assess  Dorsal Hand  Mild depletion  Patellar Region  No depletion  Anterior Thigh Region  No depletion  Posterior Calf Region  No depletion  Edema (RD Assessment)  None  Hair  Reviewed  Eyes  Reviewed  Mouth  Reviewed  Skin  Reviewed  Nails  Reviewed     Diet Order:   Diet Order            Diet regular Room service appropriate? Yes; Fluid consistency: Thin  Diet effective now              EDUCATION NEEDS:   Education needs have been addressed  Skin:  Skin Assessment: Reviewed RN Assessment  Last BM:  08/18/2018  Height:   Ht Readings from Last 1 Encounters:  08/18/18 6\' 2"  (1.88 m)    Weight:   Wt Readings from Last 1 Encounters:  08/18/18 100 kg    Ideal Body Weight:  86.4  kg  BMI:  Body mass index is 28.31 kg/m.  Estimated Nutritional Needs:   Kcal:  2400-2600 kcal  Protein:  120-135 grams  Fluid:  >/= 2.4 L/day   Mariana Single RD, LDN Clinical Nutrition Pager # - [phone]

## 2018-08-20 ENCOUNTER — Inpatient Hospital Stay: Payer: 59

## 2018-08-20 ENCOUNTER — Observation Stay (HOSPITAL_COMMUNITY): Payer: 59

## 2018-08-20 DIAGNOSIS — R180 Malignant ascites: Secondary | ICD-10-CM

## 2018-08-20 DIAGNOSIS — J181 Lobar pneumonia, unspecified organism: Secondary | ICD-10-CM | POA: Diagnosis not present

## 2018-08-20 DIAGNOSIS — J189 Pneumonia, unspecified organism: Secondary | ICD-10-CM

## 2018-08-20 DIAGNOSIS — C189 Malignant neoplasm of colon, unspecified: Secondary | ICD-10-CM | POA: Diagnosis not present

## 2018-08-20 DIAGNOSIS — R188 Other ascites: Secondary | ICD-10-CM

## 2018-08-20 DIAGNOSIS — J690 Pneumonitis due to inhalation of food and vomit: Secondary | ICD-10-CM | POA: Diagnosis not present

## 2018-08-20 LAB — BASIC METABOLIC PANEL
Anion gap: 9 (ref 5–15)
BUN: 11 mg/dL (ref 6–20)
CO2: 25 mmol/L (ref 22–32)
Calcium: 8.4 mg/dL — ABNORMAL LOW (ref 8.9–10.3)
Chloride: 106 mmol/L (ref 98–111)
Creatinine, Ser: 0.72 mg/dL (ref 0.61–1.24)
GFR calc Af Amer: >60 mL/min (ref >60)
GFR calc non Af Amer: >60 mL/min (ref >60)
GLUCOSE: 121 mg/dL — AB (ref 70–99)
Potassium: 3.9 mmol/L (ref 3.5–5.1)
Sodium: 140 mmol/L (ref 135–145)

## 2018-08-20 LAB — CBC WITH DIFFERENTIAL/PLATELET
Abs Immature Granulocytes: 0.02 10*3/uL (ref 0.00–0.07)
Basophils Absolute: 0.1 10*3/uL (ref 0.0–0.1)
Basophils Relative: 1 %
EOS ABS: 0.2 10*3/uL (ref 0.0–0.5)
Eosinophils Relative: 4 %
HCT: 33.8 % — ABNORMAL LOW (ref 39.0–52.0)
Hemoglobin: 10 g/dL — ABNORMAL LOW (ref 13.0–17.0)
Immature Granulocytes: 0 %
Lymphocytes Relative: 10 %
Lymphs Abs: 0.6 10*3/uL — ABNORMAL LOW (ref 0.7–4.0)
MCH: 24.8 pg — ABNORMAL LOW (ref 26.0–34.0)
MCHC: 29.6 g/dL — ABNORMAL LOW (ref 30.0–36.0)
MCV: 83.9 fL (ref 80.0–100.0)
Monocytes Absolute: 0.9 10*3/uL (ref 0.1–1.0)
Monocytes Relative: 14 %
NEUTROS ABS: 4.3 10*3/uL (ref 1.7–7.7)
Neutrophils Relative %: 71 %
Platelets: 248 10*3/uL (ref 150–400)
RBC: 4.03 MIL/uL — ABNORMAL LOW (ref 4.22–5.81)
RDW: 17 % — AB (ref 11.5–15.5)
WBC: 6.1 10*3/uL (ref 4.0–10.5)
nRBC: 0 % (ref 0.0–0.2)

## 2018-08-20 LAB — BODY FLUID CELL COUNT WITH DIFFERENTIAL
Eos, Fluid: 2 %
Lymphs, Fluid: 24 %
Monocyte-Macrophage-Serous Fluid: 54 % (ref 50–90)
Neutrophil Count, Fluid: 21 % (ref 0–25)
Total Nucleated Cell Count, Fluid: 561 cu mm (ref 0–1000)

## 2018-08-20 LAB — PROTEIN, PLEURAL OR PERITONEAL FLUID: TOTAL PROTEIN, FLUID: 3.1 g/dL

## 2018-08-20 LAB — LACTATE DEHYDROGENASE, PLEURAL OR PERITONEAL FLUID: LD, Fluid: 98 U/L — ABNORMAL HIGH (ref 3–23)

## 2018-08-20 MED ORDER — ENSURE ENLIVE PO LIQD: 237.0000 mL | Freq: Two times a day (BID) | ORAL | 12 refills | Status: DC

## 2018-08-20 MED ORDER — AMOXICILLIN-POT CLAVULANATE 875-125 MG PO TABS
1.0000 | ORAL_TABLET | Freq: Two times a day (BID) | ORAL | Status: DC
  Administered 2018-08-20: 1 via ORAL
  Filled 2018-08-20: qty 1

## 2018-08-20 MED ORDER — AMOXICILLIN-POT CLAVULANATE 875-125 MG PO TABS: 1.0000 | ORAL_TABLET | Freq: Two times a day (BID) | ORAL | 0 refills | Status: AC

## 2018-08-20 MED ORDER — HEPARIN SOD (PORK) LOCK FLUSH 100 UNIT/ML IV SOLN
500.0000 [IU] | INTRAVENOUS | Status: AC | PRN
  Administered 2018-08-20: 500 [IU]

## 2018-08-20 MED ORDER — POLYETHYLENE GLYCOL 3350 17 G PO PACK: 17.0000 g | PACK | Freq: Every day | ORAL | 1 refills | Status: DC

## 2018-08-20 MED ORDER — ADULT MULTIVITAMIN W/MINERALS CH: 1.0000 | ORAL_TABLET | Freq: Every day | ORAL | 0 refills | Status: DC

## 2018-08-20 NOTE — Progress Notes (Signed)
PROGRESS NOTE  Scott Gallagher [id number]:[id number] DOB: November 19, 1970 DOA: 08/17/2018 PCP: [name], MD  HPI/Recap of past 24 hours:  Ab still distended, mild abdominal pain, no appetite He started to have bm Overall feeling better than yesterday, no cough, no fever Wife at bedside  Assessment/Plan: Principal Problem:   Aspiration pneumonia ([place]) Active Problems:   Diabetes mellitus type 2, diet-controlled ([place])   OSA (obstructive sleep apnea)   Metastatic colon cancer to liver (HCC)   Sepsis (HCC)   Abdominal pain   Nausea & vomiting   Chronic anemia   Physical deconditioning   Anxiety   FTT (failure to thrive) in adult  1. Sepsis due to RLL aspiration pneumonia: No history of dysphagia.  Likely related to recent chemo related nausea and emesis.  Patient and spouse declined speech therapy swallow evaluation.  Met sepsis criteria on admission including fever, tachycardia, leukocytosis.  Lactate normal and not hypoxic.  CT abdomen and pelvis and chest x-ray did show mild airspace opacities in the right lung base concerning for pneumonia.  Patient allergic to penicillin and hence was placed on cefepime and metronidazole.Patient reports tolerated amoxicillin without problems. Clinically improving, d/c cefepime/flagyl, change to augmentin.   Recommend repeating chest x-ray in 4 weeks to ensure resolution of pneumonia findings.  respiratory viral panel negative.  Blood cultures no growth.  Leukocytosis resolved.  Sepsis physiology resolved. 2. Abdominal distension: + constipation, + ascites, had bm, ab remain tight, will get US guided paracentesis with fluids study, cell count/gram statin and culture 3. 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  It does show mild to moderate ascites concerning for peritoneal carcinomatosis, worsening hepatic metastasis and possible  pulmonary metastasis  4. OSA on CPAP. 5. FTT: feeling stronger, did well with PT  Code Status: full  Family Communication: patient and wife at bedside  Disposition Plan: pending clinical improvement, oral intake, need therapeutic paracentesis   Consultants:  none  Procedures:  none  Antibiotics:  As above   Objective: BP 126/81 (BP Location: Left Arm)   Pulse 91   Temp 99 F (37.2 C) (Oral)   Resp 16   Ht _0  (1.88 m)   Wt 100 kg   SpO2 93%   BMI 28.31 kg/m   Intake/Output Summary (Last 24 hours) at 08/20/2018 0959 Last data filed at 08/20/2018 0700 Gross per 24 hour  Intake 731.68 ml  Output 1 ml  Net 730.68 ml   Filed Weights   08/17/18 2215 08/18/18 0235  Weight: 100.7 kg 100 kg    Exam: Patient is examined daily including today on 08/20/2018, exams remain the same as of yesterday except that has changed    General:  Pale, frail, chronically ill appearing, NAD  Cardiovascular: RRR  Respiratory: diminished at basis, no wheezing, no rales, no rhonchi  Abdomen: abdomen tight and distended , mild diffuse tender, reports is not new, positive BS  Musculoskeletal: No Edema  Neuro: alert, oriented   Data Reviewed: Basic Metabolic Panel: Recent Labs  Lab 08/17/18 2254 08/20/18 0418  NA 135 140  K 4.2 3.9  CL 100 106  CO2 25 25  GLUCOSE 133* 121*  BUN 16 11  CREATININE 0.88 0.72  CALCIUM 8.7* 8.4*   Liver Function Tests: Recent Labs  Lab 08/17/18 2254  AST 23  ALT 18  ALKPHOS 105  BILITOT 1.2  PROT 6.5  ALBUMIN 3.6  No results for input(s): LIPASE, AMYLASE in the last 168 hours. No results for input(s): AMMONIA in the last 168 hours. CBC: Recent Labs  Lab 08/17/18 2254 08/18/18 0412 08/20/18 0418  WBC 11.9* 9.6 6.1  NEUTROABS 9.8*  --  4.3  HGB 11.5* 11.0* 10.0*  HCT 38.4* 37.3* 33.8*  MCV 84.8 85.6 83.9  PLT 285 230 248   Cardiac Enzymes:   No results for input(s): CKTOTAL, CKMB, CKMBINDEX, TROPONINI in the last 168  hours. BNP (last 3 results) No results for input(s): BNP in the last 8760 hours.  ProBNP (last 3 results) No results for input(s): PROBNP in the last 8760 hours.  CBG: No results for input(s): GLUCAP in the last 168 hours.  Recent Results (from the past 240 hour(s))  Culture, blood (routine x 2)     Status: None (Preliminary result)   Collection Time: 08/17/18 10:50 PM  Result Value Ref Range Status   Specimen Description   Final    BLOOD PORTA CATH Performed at [facility], [address], [place], [place] [address]    Special Requests   Final    BOTTLES DRAWN AEROBIC AND ANAEROBIC Blood Culture adequate volume Performed at [facility] [address] [place], [place] [address]    Culture NO GROWTH 2 DAYS  Final   Report Status PENDING  Incomplete  Culture, blood (routine x 2)     Status: None (Preliminary result)   Collection Time: 08/17/18 11:21 PM  Result Value Ref Range Status   Specimen Description BLOOD RIGHT ARM  Final   Special Requests   Final    BOTTLES DRAWN AEROBIC AND ANAEROBIC Blood Culture adequate volume Performed at [facility] [address], [place], [place] [address]    Culture NO GROWTH 2 DAYS  Final   Report Status PENDING  Incomplete  Respiratory Panel by PCR     Status: None   Collection Time: 08/18/18 12:09 AM  Result Value Ref Range Status   Adenovirus NOT DETECTED NOT DETECTED Final   Coronavirus 229E NOT DETECTED NOT DETECTED Final   Coronavirus HKU1 NOT DETECTED NOT DETECTED Final   Coronavirus NL63 NOT DETECTED NOT DETECTED Final   Coronavirus OC43 NOT DETECTED NOT DETECTED Final   Metapneumovirus NOT DETECTED NOT DETECTED Final   Rhinovirus / Enterovirus NOT DETECTED NOT DETECTED Final   Influenza A NOT DETECTED NOT DETECTED Final   Influenza B NOT DETECTED NOT DETECTED Final   Parainfluenza Virus 1 NOT DETECTED NOT DETECTED Final   Parainfluenza Virus 2 NOT DETECTED NOT DETECTED Final    Parainfluenza Virus 3 NOT DETECTED NOT DETECTED Final   Parainfluenza Virus 4 NOT DETECTED NOT DETECTED Final   Respiratory Syncytial Virus NOT DETECTED NOT DETECTED Final   Bordetella pertussis NOT DETECTED NOT DETECTED Final   Chlamydophila pneumoniae NOT DETECTED NOT DETECTED Final   Mycoplasma pneumoniae NOT DETECTED NOT DETECTED Final    Comment: Performed at [facility], [place] [address] [place], [place] [address]  Urine culture     Status: None   Collection Time: 08/18/18  4:50 AM  Result Value Ref Range Status   Specimen Description   Final    URINE, CLEAN CATCH Performed at [facility], [place] [address] [place], [place] [address]    Special Requests   Final    Normal Performed at [facility], [place] [address] [place], [place] [address]    Culture  Final    NO GROWTH Performed at [facility], [place] [address], [place], [place] [address]    Report Status 08/19/2018 FINAL  Final     Studies: No results found.  Scheduled Meds: . dronabinol  5 mg Oral BID AC  . enoxaparin (LOVENOX) injection  40 mg Subcutaneous Q24H  . feeding supplement (ENSURE ENLIVE)  237 mL Oral BID BM  . fentaNYL  75 mcg Transdermal Q72H  . multivitamin with minerals  1 tablet Oral Daily  . polyethylene glycol  17 g Oral BID    Continuous Infusions: . ceFEPime (MAXIPIME) IV 2 g (08/20/18 0541)  . metronidazole 500 mg (08/20/18 8588)     Time spent: 3mns I have personally reviewed and interpreted on  08/20/2018 daily labs,  imagings as discussed above under date review session and assessment and plans.  I reviewed all nursing notes, pharmacy notes,   vitals, pertinent old records  I have discussed plan of care as described above with RN , patient and family on 08/20/2018   FFlorencia ReasonsMD, PhD  [facility] Pager [phone] If 7PM-7AM, please contact night-coverage at [email or web address], password [email or web address]1/11/2018, 9:59 AM  LOS: 0 days

## 2018-08-20 NOTE — Progress Notes (Signed)
PT Cancellation Note  Patient Details Name: Scott Gallagher MRN: [id number] DOB: 1970/12/04   Cancelled Treatment:    Reason Eval/Treat Not Completed: Other (comment); new order received, messaged MD about already seen and discharged by PT.  She said no need to repeat.  Will sign off.    Reginia Naas 08/20/2018, 1:18 PM  Magda Kiel, [place] [phone] 08/20/2018

## 2018-08-20 NOTE — Discharge Summary (Addendum)
Discharge Summary  Scott Gallagher FHQ:[id number] DOB: Jan 02, 1971  PCP: [name], MD  Admit date: 08/17/2018 Discharge date: 08/20/2018  Time spent: 37mns, more than 50% time spent on coordination of care.  Recommendations for Outpatient Follow-up:  1. F/u with PCP within a week  for hospital discharge follow up, repeat cbc/bmp at follow up, pcp to follow up on ascites fluids culture 2. F/u with oncology Dr KIrene Limbo Discharge Diagnoses:  Active Hospital Problems   Diagnosis Date Noted  . Aspiration pneumonia ([facility] 08/18/2018  . Ascites   . Pneumonia of right lower lobe due to infectious organism ([place]   . FTT (failure to thrive) in adult   . Sepsis ([facility] 08/18/2018  . Abdominal pain 08/18/2018  . Nausea & vomiting 08/18/2018  . Chronic anemia 08/18/2018  . Physical deconditioning 08/18/2018  . Anxiety 08/18/2018  . Metastatic colon cancer to liver ([facility] 07/01/2016  . Diabetes mellitus type 2, diet-controlled ([facility] [place] 05/22/2016  . OSA (obstructive sleep apnea) 05/22/2016    Resolved Hospital Problems  No resolved problems to display.    Discharge Condition: stable  Diet recommendation: regular diet  Filed Weights   08/17/18 2215 08/18/18 0235  Weight: 100.7 kg 100 kg    History of present illness: (per admitting MD Dr RMarlowe Sax PCP: SAntony Contras MD Patient coming from: Home  Chief Complaint: Fever  HPI: Scott Coppedgeis a 48y.o. 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  Patient states he felt nauseated this morning and burped.  He thinks he may have aspirated the contents in his mouth.  Since then, he has been coughing all day.  He later checked his temperature at home and it was 101.2.  He called his oncologist's office and was advised to come into the hospital.  Reports having  chronic intermittent nausea secondary to chemotherapy.  Does state that he vomited bile a few days ago.  Wife at bedside is concerned that patient has not been eating much.  He has chronic abdominal pain secondary to his colon cancer but wife believes patient has been very uncomfortable lately due to increasing abdominal pain.  Patient states his abdominal pain is in the left lower quadrant region.  He has been taking pain medications at home and his last bowel movement was 6 days ago.  He has been taking senna which has not helped him have a bowel movement yet.  Hospital Course:  Principal Problem:   Aspiration pneumonia ([facility] Active Problems:   Diabetes mellitus type 2, diet-controlled (HCC)   OSA (obstructive sleep apnea)   Metastatic colon cancer to liver (HCC)   Sepsis (HCC)   Abdominal pain   Nausea & vomiting   Chronic anemia   Physical deconditioning   Anxiety   FTT (failure to thrive) in adult   Ascites   Pneumonia of right lower lobe due to infectious organism ([facility]  1. Sepsis due to RLL aspiration pneumonia:No history of dysphagia. Likely related to recent chemo related nausea and emesis. Patient and spouse declined speech therapy swallow evaluation. Met sepsis criteria on admission including fever, tachycardia, leukocytosis. Lactate normal and not hypoxic. CT abdomen and pelvis and chest x-ray did show mild airspace opacities in the right lung base concerning for pneumonia. Patient allergic to penicillin and hence was placed on cefepime and metronidazole.Patient reports tolerated amoxicillin without problems. Clinically improving, d/c cefepime/flagyl,  change to augmentin.  Recommend repeating chest x-ray in 4 weeks to ensure resolution of pneumonia findings. respiratory viral panel negative. Blood cultures no growth. Leukocytosis resolved. Sepsis physiology resolved. 2. Abdominal distension: + constipation, + ascites, had bm, s/p US guided paracentesis with 3.6liter  peritoneal fluids removed. cell count/gram statin and culture in process, patient feels a lot better after paracentesis, he wants to go home, he is discharge to home with close follow up with pcp for final culture result, he is discharged on augmentin as above. 3. 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 Pulmonary metastasis, status post right hemicolectomy CT abdomen does not show obstruction. It does show mild to moderate ascites concerning for peritoneal carcinomatosis, worsening hepatic metastasis and possible pulmonary metastasis Oncology Dr Irene Limbo made social visit , he is to see patient in two weeks in the clinic  4. OSA on CPAP. 5. FTT: feeling stronger, did well with PT 6. Severe malnutrition in context of chronic illness Pt reports a UBW of 270 lb and a weight loss of 50 lb. Records indicate pt weighed 259 lb on 02/16/18 and 220 lb this admission (15% wt loss in 6 months, significant for time frame).  Severe Malnutrition related to cancer and cancer related treatments, chronic illness as evidenced by energy intake < or equal to 75% for > or equal to 1 month, percent weight loss, mild fat depletion, mild muscle depletion, moderate muscle depletion Nutrition input appreciated.  Code Status: full  Family Communication: patient and wife at bedside  Disposition Plan: home   Consultants:  none  Procedures:  US guided paracentesis on 1/4  Antibiotics:  As above  Discharge Exam: BP 119/73 (BP Location: Left Arm)   Pulse 87   Temp 99.3 F (37.4 C) (Oral)   Resp 16   Ht 6' 2" (1.88 m)   Wt 100 kg   SpO2 96%   BMI 28.31 kg/m   General: NAD Cardiovascular: RRR Respiratory: CTABL Ab: soft, less distended, + bs  Discharge Instructions You were cared for by a hospitalist during your hospital stay. If you have any questions about your discharge medications or the care you received while you were in the hospital after you are  discharged, you can call the unit and asked to speak with the hospitalist on call if the hospitalist that took care of you is not available. Once you are discharged, your primary care physician will handle any further medical issues. Please note that NO REFILLS for any discharge medications will be authorized once you are discharged, as it is imperative that you return to your primary care physician (or establish a relationship with a primary care physician if you do not have one) for your aftercare needs so that they can reassess your need for medications and monitor your lab values.  Discharge Instructions    Diet general   Complete by:  As directed    Increase activity slowly   Complete by:  As directed      Allergies as of 08/20/2018      Reactions   Penicillins    Was told as a child he was allergic does not know of type of reaction.  Has patient had a PCN reaction causing immediate rash, facial/tongue/throat swelling, SOB or lightheadedness with hypotension: N Has patient had a PCN reaction causing severe rash involving mucus membranes or skin necrosis: N Has patient had a PCN reaction that required hospitalization: N Has patient had a PCN reaction occurring  within the last 10 years: N If all of the above answers are "NO", then may proceed with Cephalospo      Medication List    TAKE these medications   amoxicillin-clavulanate 875-125 MG tablet Commonly known as:  AUGMENTIN Take 1 tablet by mouth every 12 (twelve) hours for 3 days.   dronabinol 5 MG capsule Commonly known as:  MARINOL Take 1 capsule (5 mg total) by mouth 2 (two) times daily before a meal.   feeding supplement (ENSURE ENLIVE) Liqd Take 237 mLs by mouth 2 (two) times daily between meals.   fentaNYL 75 MCG/HR Commonly known as:  DURAGESIC - dosed mcg/hr Place 1 patch (75 mcg total) onto the skin every 3 (three) days.   loperamide 2 MG tablet Commonly known as:  IMODIUM A-D Take 1-2 tablets (2-4 mg total) by  mouth 4 (four) times daily as needed for diarrhea or loose stools.   LORazepam 0.5 MG tablet Commonly known as:  ATIVAN Take 1-2 tablets (0.5-1 mg total) by mouth every 8 (eight) hours as needed for anxiety (nausea).   Melatonin 3 MG Tabs Take 6 tablets by mouth at bedtime as needed (sleep).   multivitamin with minerals Tabs tablet Take 1 tablet by mouth daily. Start taking on:  August 21, 2018   ondansetron 4 MG disintegrating tablet Commonly known as:  ZOFRAN ODT Take 1 tablet (4 mg total) by mouth every 8 (eight) hours as needed for nausea or vomiting.   ondansetron 8 MG tablet Commonly known as:  ZOFRAN Take 1 tablet (8 mg total) by mouth 2 (two) times daily as needed for refractory nausea / vomiting. Start on day 3 after chemotherapy.   Oxycodone HCl 10 MG Tabs Take 1-2 tablets (10-20 mg total) by mouth every 4 (four) hours as needed.   polyethylene glycol packet Commonly known as:  MIRALAX Take 17 g by mouth daily. What changed:    when to take this  reasons to take this   prochlorperazine 10 MG tablet Commonly known as:  COMPAZINE TAKE 1 TABLET BY MOUTH EVERY 6 HOURS AS NEEDED FOR NAUSEA / VOMITING   senna-docusate 8.6-50 MG tablet Commonly known as:  SENNA S Take 2 tablets by mouth 2 (two) times daily.      Allergies  Allergen Reactions  . Penicillins     Was told as a child he was allergic does not know of type of reaction.   Has patient had a PCN reaction causing immediate rash, facial/tongue/throat swelling, SOB or lightheadedness with hypotension: N Has patient had a PCN reaction causing severe rash involving mucus membranes or skin necrosis: N Has patient had a PCN reaction that required hospitalization: N Has patient had a PCN reaction occurring within the last 10 years: N If all of the above answers are "NO", then may proceed with Cephalospo   Follow-up Information    [name], MD Follow up in 1 week(s).   Specialty:  Family Medicine Why:   hospital discharge follow up, pcp to follow up on body fluids culture.  Contact information: [place], [place] [address] [phone]            The results of significant diagnostics from this hospitalization (including imaging, microbiology, ancillary and laboratory) are listed below for reference.    Significant Diagnostic Studies: Ct Abdomen Pelvis Wo Contrast  Result Date: 08/18/2018 CLINICAL DATA:  Acute onset of fever, nausea and vomiting. Constipation. On chemotherapy for colon cancer with liver metastases.  EXAM: CT ABDOMEN AND PELVIS WITHOUT CONTRAST TECHNIQUE: Multidetector CT imaging of the abdomen and pelvis was performed following the standard protocol without IV contrast. COMPARISON:  CT of the abdomen and pelvis performed 06/12/2018 FINDINGS: Lower chest: Mild patchy airspace opacities at the right lung base raise concern for pneumonia. Mild left basilar atelectasis is noted. Scattered small nodules at the right lung base measure up to 9 mm in size. Metastatic disease cannot be excluded. The visualized portions of the mediastinum are unremarkable. Hepatobiliary: Diffuse peripheral metastatic lesions are noted throughout the liver, more prominent than in October. These are difficult to fully characterize without contrast. High attenuation within the gallbladder may reflect residual contrast, as on the prior study. The common bile duct stent is noted in expected position. Pancreas: The pancreas is within normal limits. Spleen: The spleen is unremarkable in appearance. Adrenals/Urinary Tract: The adrenal glands are unremarkable in appearance. A small left renal cyst is noted. Nonobstructing left renal stones measure up to 6 mm in size. Nonspecific perinephric stranding is noted bilaterally. There is no evidence of hydronephrosis. No obstructing ureteral stones are identified. Stomach/Bowel: The patient is status post right-sided hemicolectomy. The ileocolic  anastomosis is grossly unremarkable in appearance the remaining colon is unremarkable in appearance. The small bowel is grossly unremarkable, though fluid is seen tracking about small bowel loops. The stomach is decompressed and grossly unremarkable in appearance. Vascular/Lymphatic: Scattered calcification is seen along the abdominal aorta and its branches. The abdominal aorta is otherwise grossly unremarkable. The inferior vena cava is grossly unremarkable. No retroperitoneal lymphadenopathy is seen. No pelvic sidewall lymphadenopathy is identified. Reproductive: The bladder is mildly distended and grossly unremarkable. The prostate is normal in size. Other: Small to moderate volume ascites is noted within the abdomen and pelvis, new from the prior study. Underlying peritoneal carcinomatosis is suspected. Musculoskeletal: No acute osseous abnormalities are identified. The visualized musculature is unremarkable in appearance. IMPRESSION: 1. Mild patchy airspace opacities at the right lung base raise concern for pneumonia. 2. Small to moderate volume ascites within the abdomen and pelvis, new from the prior study. Underlying peritoneal carcinomatosis suspected. 3. Diffuse peripheral metastatic lesions throughout the liver, more prominent than in October. These are difficult to fully characterize without contrast. 4. Nonobstructing left renal stones measure up to 6 mm in size. Small left renal cyst. 5. Small nodules at the right lung base measure up to 9 mm in size. Metastatic disease cannot be excluded. Aortic Atherosclerosis (ICD10-I70.0). Electronically Signed   By: Garald Balding M.D.   On: 08/18/2018 05:33   Dg Chest 2 View  Result Date: 08/17/2018 CLINICAL DATA:  Colon cancer. Fever. EXAM: CHEST - 2 VIEW COMPARISON:  12/23/2017 chest radiograph. FINDINGS: Right internal jugular Port-A-Cath terminates in middle third of the SVC. Stable cardiomediastinal silhouette with normal heart size. No pneumothorax. No  pleural effusion. Mild elevation of the left hemidiaphragm. No pulmonary edema. Hazy posterior lung base opacity on the lateral view, favor basilar right lower lobe opacity. IMPRESSION: Hazy posterior lung base opacity on the lateral view, favor basilar right lower lobe opacity, which could represent atelectasis or pneumonia. Follow-up chest radiographs advised. Electronically Signed   By: Ilona Sorrel M.D.   On: 08/17/2018 22:50   US Paracentesis  Result Date: 08/20/2018 INDICATION: Ascites EXAM: ULTRASOUND GUIDED  PARACENTESIS MEDICATIONS: None. COMPLICATIONS: None immediate. PROCEDURE: Informed written consent was obtained from the patient after a discussion of the risks, benefits and alternatives to treatment. A timeout was performed prior to the initiation  of the procedure. Initial ultrasound scanning demonstrates a moderate amount of ascites within the right lower abdominal quadrant. The left lower abdomen was prepped and draped in the usual sterile fashion. 1% lidocaine with epinephrine was used for local anesthesia. Following this, a 6 Fr Safe-T-Centesis catheter was introduced. An ultrasound image was saved for documentation purposes. The paracentesis was performed. The catheter was removed and a dressing was applied. The patient tolerated the procedure well without immediate post procedural complication. FINDINGS: A total of approximately 3.6 L of clear yellow fluid was removed. Samples were sent to the laboratory as requested by the clinical team. IMPRESSION: Successful ultrasound-guided paracentesis yielding 3.6 liters of peritoneal fluid. Electronically Signed   By: Marybelle Killings M.D.   On: 08/20/2018 14:02   Dg Abd 2 Views  Result Date: 08/04/2018 CLINICAL DATA:  History of colon carcinoma, nausea and vomiting, weakness EXAM: ABDOMEN - 2 VIEW COMPARISON:  CT abdomen pelvis of 06/12/2017 FINDINGS: Supine and erect views the abdomen show a moderately large amount of feces throughout the remaining  colon after right hemicolectomy. Surgical sutures are noted near the expected hepatic flexure of colon-proximal transverse colon. No bowel obstruction is seen and no free air is noted. Common bile duct stent is present. No bony abnormality is seen. IMPRESSION: 1. Moderately large amount of feces throughout the remaining colon in this patient with a history of right hemicolectomy. 2. Common bile duct stent is present. 3. No free air. Electronically Signed   By: Ivar Drape M.D.   On: 08/04/2018 14:17    Microbiology: Recent Results (from the past 240 hour(s))  Culture, blood (routine x 2)     Status: None (Preliminary result)   Collection Time: 08/17/18 10:50 PM  Result Value Ref Range Status   Specimen Description   Final    BLOOD PORTA CATH Performed at [facility], [address] [place], [place] [address]    Special Requests   Final    BOTTLES DRAWN AEROBIC AND ANAEROBIC Blood Culture adequate volume Performed at [facility] [address] [place], [place] [address]    Culture NO GROWTH 2 DAYS  Final   Report Status PENDING  Incomplete  Culture, blood (routine x 2)     Status: None (Preliminary result)   Collection Time: 08/17/18 11:21 PM  Result Value Ref Range Status   Specimen Description BLOOD RIGHT ARM  Final   Special Requests   Final    BOTTLES DRAWN AEROBIC AND ANAEROBIC Blood Culture adequate volume Performed at [facility] [address], [place],  [address]    Culture NO GROWTH 2 DAYS  Final   Report Status PENDING  Incomplete  Respiratory Panel by PCR     Status: None   Collection Time: 08/18/18 12:09 AM  Result Value Ref Range Status   Adenovirus NOT DETECTED NOT DETECTED Final   Coronavirus 229E NOT DETECTED NOT DETECTED Final   Coronavirus HKU1 NOT DETECTED NOT DETECTED Final   Coronavirus NL63 NOT DETECTED NOT DETECTED Final   Coronavirus OC43 NOT DETECTED NOT DETECTED Final   Metapneumovirus NOT DETECTED  NOT DETECTED Final   Rhinovirus / Enterovirus NOT DETECTED NOT DETECTED Final   Influenza A NOT DETECTED NOT DETECTED Final   Influenza B NOT DETECTED NOT DETECTED Final   Parainfluenza Virus 1 NOT DETECTED NOT DETECTED Final   Parainfluenza Virus 2 NOT DETECTED NOT DETECTED Final   Parainfluenza Virus 3 NOT DETECTED NOT DETECTED Final  Parainfluenza Virus 4 NOT DETECTED NOT DETECTED Final   Respiratory Syncytial Virus NOT DETECTED NOT DETECTED Final   Bordetella pertussis NOT DETECTED NOT DETECTED Final   Chlamydophila pneumoniae NOT DETECTED NOT DETECTED Final   Mycoplasma pneumoniae NOT DETECTED NOT DETECTED Final    Comment: Performed at [facility], [place] [address], [place], [place] [address]  Urine culture     Status: None   Collection Time: 08/18/18  4:50 AM  Result Value Ref Range Status   Specimen Description   Final    URINE, CLEAN CATCH Performed at [facility], [place] [address], [place], [place] [address]    Special Requests   Final    Normal Performed at [facility], [place] [address], [place], [place] [address]    Culture   Final    NO GROWTH Performed at [facility], [place] [address], [place], [place] [address]    Report Status 08/19/2018 FINAL  Final     Labs: Basic Metabolic Panel: Recent Labs  Lab 08/17/18 2254 08/20/18 0418  NA 135 140  K 4.2 3.9  CL 100 106  CO2 25 25  GLUCOSE 133* 121*  BUN 16 11  CREATININE 0.88 0.72  CALCIUM 8.7* 8.4*   Liver Function Tests: Recent Labs  Lab 08/17/18 2254  AST 23  ALT 18  ALKPHOS 105  BILITOT 1.2  PROT 6.5  ALBUMIN 3.6   No results for input(s): LIPASE, AMYLASE in the last 168 hours. No results for input(s): AMMONIA in the last 168 hours. CBC: Recent Labs  Lab 08/17/18 2254 08/18/18 0412 08/20/18 0418  WBC 11.9* 9.6 6.1  NEUTROABS 9.8*  --  4.3  HGB 11.5* 11.0* 10.0*  HCT 38.4* 37.3* 33.8*  MCV 84.8 85.6 83.9  PLT 285 230 248   Cardiac  Enzymes: No results for input(s): CKTOTAL, CKMB, CKMBINDEX, TROPONINI in the last 168 hours. BNP: BNP (last 3 results) No results for input(s): BNP in the last 8760 hours.  ProBNP (last 3 results) No results for input(s): PROBNP in the last 8760 hours.  CBG: No results for input(s): GLUCAP in the last 168 hours.     Signed:  Florencia Reasons MD, PhD  [facility] 08/20/2018, 2:41 PM

## 2018-08-22 LAB — PATHOLOGIST SMEAR REVIEW

## 2018-08-23 ENCOUNTER — Emergency Department (HOSPITAL_COMMUNITY): Payer: 59

## 2018-08-23 ENCOUNTER — Emergency Department (HOSPITAL_COMMUNITY)
Admission: EM | Admit: 2018-08-23 | Discharge: 2018-08-24 | Disposition: A | Discharge status: Home / Self Care | Payer: 59 | Attending: Emergency Medicine | Admitting: Emergency Medicine

## 2018-08-23 ENCOUNTER — Other Ambulatory Visit: Payer: Self-pay

## 2018-08-23 ENCOUNTER — Encounter (HOSPITAL_COMMUNITY): Payer: Self-pay | Admitting: Emergency Medicine

## 2018-08-23 DIAGNOSIS — J90 Pleural effusion, not elsewhere classified: Secondary | ICD-10-CM | POA: Diagnosis not present

## 2018-08-23 DIAGNOSIS — Z79899 Other long term (current) drug therapy: Secondary | ICD-10-CM | POA: Insufficient documentation

## 2018-08-23 DIAGNOSIS — J918 Pleural effusion in other conditions classified elsewhere: Secondary | ICD-10-CM | POA: Diagnosis not present

## 2018-08-23 DIAGNOSIS — R05 Cough: Secondary | ICD-10-CM | POA: Diagnosis present

## 2018-08-23 DIAGNOSIS — R180 Malignant ascites: Secondary | ICD-10-CM | POA: Diagnosis not present

## 2018-08-23 DIAGNOSIS — C787 Secondary malignant neoplasm of liver and intrahepatic bile duct: Secondary | ICD-10-CM | POA: Insufficient documentation

## 2018-08-23 DIAGNOSIS — K769 Liver disease, unspecified: Secondary | ICD-10-CM | POA: Insufficient documentation

## 2018-08-23 DIAGNOSIS — E119 Type 2 diabetes mellitus without complications: Secondary | ICD-10-CM | POA: Insufficient documentation

## 2018-08-23 DIAGNOSIS — J9801 Acute bronchospasm: Secondary | ICD-10-CM

## 2018-08-23 DIAGNOSIS — Z85038 Personal history of other malignant neoplasm of large intestine: Secondary | ICD-10-CM | POA: Insufficient documentation

## 2018-08-23 DIAGNOSIS — Z87891 Personal history of nicotine dependence: Secondary | ICD-10-CM | POA: Diagnosis not present

## 2018-08-23 DIAGNOSIS — J189 Pneumonia, unspecified organism: Secondary | ICD-10-CM | POA: Diagnosis not present

## 2018-08-23 LAB — CULTURE, BLOOD (ROUTINE X 2)
CULTURE: NO GROWTH
Culture: NO GROWTH
SPECIAL REQUESTS: ADEQUATE
Special Requests: ADEQUATE

## 2018-08-23 LAB — CBC WITH DIFFERENTIAL/PLATELET
Abs Immature Granulocytes: 0.05 10*3/uL (ref 0.00–0.07)
BASOS ABS: 0.1 10*3/uL (ref 0.0–0.1)
BASOS PCT: 1 %
Eosinophils Absolute: 0.4 10*3/uL (ref 0.0–0.5)
Eosinophils Relative: 6 %
HCT: 35.3 % — ABNORMAL LOW (ref 39.0–52.0)
Hemoglobin: 10.5 g/dL — ABNORMAL LOW (ref 13.0–17.0)
Immature Granulocytes: 1 %
Lymphocytes Relative: 11 %
Lymphs Abs: 0.8 10*3/uL (ref 0.7–4.0)
MCH: 25.5 pg — ABNORMAL LOW (ref 26.0–34.0)
MCHC: 29.7 g/dL — ABNORMAL LOW (ref 30.0–36.0)
MCV: 85.9 fL (ref 80.0–100.0)
Monocytes Absolute: 1.1 10*3/uL — ABNORMAL HIGH (ref 0.1–1.0)
Monocytes Relative: 14 %
Neutro Abs: 5 10*3/uL (ref 1.7–7.7)
Neutrophils Relative %: 67 %
PLATELETS: 302 10*3/uL (ref 150–400)
RBC: 4.11 MIL/uL — AB (ref 4.22–5.81)
RDW: 17.6 % — ABNORMAL HIGH (ref 11.5–15.5)
WBC: 7.3 10*3/uL (ref 4.0–10.5)
nRBC: 0 % (ref 0.0–0.2)

## 2018-08-23 LAB — I-STAT CG4 LACTIC ACID, ED: Lactic Acid, Venous: 1.13 mmol/L (ref 0.5–1.9)

## 2018-08-23 MED ORDER — IPRATROPIUM-ALBUTEROL 0.5-2.5 (3) MG/3ML IN SOLN
3.0000 mL | Freq: Once | RESPIRATORY_TRACT | Status: AC
  Administered 2018-08-23: 3 mL via RESPIRATORY_TRACT
  Filled 2018-08-23: qty 3

## 2018-08-23 NOTE — ED Triage Notes (Signed)
Pt arriving with cough. Pt was discharged on Saturday after being diagnosed with pneumonia. Cancer pt

## 2018-08-23 NOTE — ED Notes (Signed)
Patient transported to X-ray 

## 2018-08-23 NOTE — ED Provider Notes (Signed)
[facility] DEPT Provider Note   CSN: [id number] Arrival date & time: 08/23/18  2141     History   Chief Complaint Chief Complaint  Patient presents with  . Cough    Cancer Pt    HPI Scott Gallagher is a 48 y.o. male who  has a past medical history of Anemia, Arthritis, Colon cancer ([place]) (dx'd 05/2016), Diabetes mellitus without complication ([place]), History of blood transfusion, Hyperlipidemia, liver mets (dx'd 05/2016), Sleep apnea, and Wears glasses. The patient was admitted to the hospital early January discharged on the fourth with diagnosis of community-acquired pneumonia.  Patient had multiple cultures for ascites, blood cultures urine cultures which were all negative.  Patient is currently on palliative care.  He states that since he has been home he has had more difficulty breathing.  He has had multiple episodes of posttussive emesis, feeling like his chest is tight, and paroxysms of uncontrolled cough.  He denies a history of bronchospasm or reactive airway.  He has finished his course of Augmentin.  He denies any fevers.  He feels that his belly has become tight again.  He is unsure if his chest tightness is due to breathing against his abdomen.   HPI  Past Medical History:  Diagnosis Date  . Anemia   . Arthritis   . Colon cancer ([place]) dx'd 05/2016  . Diabetes mellitus without complication ([facility])    diet controlled  . History of blood transfusion   . Hyperlipidemia   . liver mets dx'd 05/2016  . Sleep apnea    cpap  . Wears glasses     Patient Active Problem List   Diagnosis Date Noted  . Ascites   . Pneumonia of right lower lobe due to infectious organism ([place])   . FTT (failure to thrive) in adult   . Aspiration pneumonia ([place]) 08/18/2018  . Sepsis ([place]) 08/18/2018  . Abdominal pain 08/18/2018  . Nausea & vomiting 08/18/2018  . Chronic anemia 08/18/2018  . Physical deconditioning 08/18/2018  . Anxiety 08/18/2018  . Counseling  regarding advanced care planning and goals of care 05/27/2017  . 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 Genetic testing 10/30/2016  . Port catheter in place 09/25/2016  . Family history of colon cancer 07/24/2016  . Family history of prostate cancer 07/24/2016  . Metastatic colon cancer to liver ([place]) 07/01/2016  . Right Colon cancer metastasized to liver ([place]) 06/17/2016  . Chronic GI bleeding 05/29/2016  . Diabetes mellitus type 2, diet-controlled ([place]) 05/22/2016  . OSA (obstructive sleep apnea) 05/22/2016  . Hyperlipidemia 05/22/2016  . Anemia due to blood loss, chronic   . Antineoplastic chemotherapy induced pancytopenia (CODE) ([place]) 05/21/2016    Past Surgical History:  Procedure Laterality Date  . COLON SURGERY    . IR GENERIC HISTORICAL  09/24/2016   IR CV LINE INJECTION 09/24/2016 [facility]  . IR GENERIC HISTORICAL  09/24/2016   IR US GUIDE VASC ACCESS RIGHT 09/24/2016 [facility]  . IR GENERIC HISTORICAL  09/24/2016   IR FLUORO GUIDE CV LINE RIGHT 09/24/2016 [facility]  . IR GENERIC HISTORICAL  09/30/2016   IR FLUORO GUIDE PORT INSERTION RIGHT 09/30/2016 Markus Daft, MD [facility]  . IR GENERIC HISTORICAL  09/30/2016   IR US GUIDE VASC ACCESS RIGHT 09/30/2016 Markus Daft, MD [facility]  . IR GENERIC HISTORICAL  09/30/2016   IR REMOVAL TUN ACCESS W/ PORT W/O FL MOD SED 09/30/2016 Markus Daft, MD [facility]  . LAPAROSCOPIC RIGHT HEMI  COLECTOMY Right 06/17/2016   Procedure: LAPAROSCOPIC ASSISTED  RIGHT HEMI COLECTOMY;  Surgeon: [name], MD;  Location: [facility];  Service: General;  Laterality: Right;  . PORTACATH PLACEMENT Left 07/13/2016   Procedure: INSERTION PORT-A-CATH left subclavian;  Surgeon: [name], MD;  Location: [facility];  Service: General;  Laterality: Left;  . SHOULDER ACROMIOPLASTY Right 12/20/2014   Procedure: SHOULDER ACROMIOPLASTY;  Surgeon: Melrose Nakayama, MD;  Location: [facility];  Service: Orthopedics;  Laterality: Right;  .  SHOULDER ARTHROSCOPY Right 12/20/2014   Procedure: RIGHT ARTHROSCOPY SHOULDER WITH DEBRIDEMENT;  Surgeon: Melrose Nakayama, MD;  Location: [facility];  Service: Orthopedics;  Laterality: Right;  . TESTICLE SURGERY     as teen  . TONSILLECTOMY    . WISDOM TOOTH EXTRACTION          Home Medications    Prior to Admission medications   Medication Sig Start Date End Date Taking? Authorizing Provider  amoxicillin-clavulanate (AUGMENTIN) 875-125 MG tablet Take 1 tablet by mouth every 12 (twelve) hours for 3 days. 08/20/18 08/23/18  Florencia Reasons, MD  dronabinol (MARINOL) 5 MG capsule Take 1 capsule (5 mg total) by mouth 2 (two) times daily before a meal. 07/21/18   Brunetta Genera, MD  feeding supplement, ENSURE ENLIVE, (ENSURE ENLIVE) LIQD Take 237 mLs by mouth 2 (two) times daily between meals. 08/20/18   Florencia Reasons, MD  fentaNYL (DURAGESIC - DOSED MCG/HR) 75 MCG/HR Place 1 patch (75 mcg total) onto the skin every 3 (three) days. 08/08/18   Brunetta Genera, MD  loperamide (IMODIUM A-D) 2 MG tablet Take 1-2 tablets (2-4 mg total) by mouth 4 (four) times daily as needed for diarrhea or loose stools. 06/22/18   Brunetta Genera, MD  LORazepam (ATIVAN) 0.5 MG tablet Take 1-2 tablets (0.5-1 mg total) by mouth every 8 (eight) hours as needed for anxiety (nausea). 08/04/18   Brunetta Genera, MD  Melatonin 3 MG TABS Take 6 tablets by mouth at bedtime as needed (sleep).  05/14/18   [provider]  Multiple Vitamin (MULTIVITAMIN WITH MINERALS) TABS tablet Take 1 tablet by mouth daily. 08/21/18   Florencia Reasons, MD  ondansetron (ZOFRAN ODT) 4 MG disintegrating tablet Take 1 tablet (4 mg total) by mouth every 8 (eight) hours as needed for nausea or vomiting. 05/08/18   Montine Circle, PA-C  ondansetron (ZOFRAN) 8 MG tablet Take 1 tablet (8 mg total) by mouth 2 (two) times daily as needed for refractory nausea / vomiting. Start on day 3 after chemotherapy. 06/22/18   Brunetta Genera, MD    Oxycodone HCl 10 MG TABS Take 1-2 tablets (10-20 mg total) by mouth every 4 (four) hours as needed. 08/04/18   Brunetta Genera, MD  polyethylene glycol [facility]) packet Take 17 g by mouth daily. 08/20/18   Florencia Reasons, MD  prochlorperazine (COMPAZINE) 10 MG tablet TAKE 1 TABLET BY MOUTH EVERY 6 HOURS AS NEEDED FOR NAUSEA / VOMITING 08/04/18   Brunetta Genera, MD  senna-docusate (SENNA S) 8.6-50 MG tablet Take 2 tablets by mouth 2 (two) times daily. 08/04/18   Brunetta Genera, MD    Family History Family History  Problem Relation Age of Onset  . Diabetes Other   . Hyperlipidemia Other   . Hypertension Other   . Breast cancer Maternal Aunt 70  . Prostate cancer Maternal Uncle 75  . Lung cancer Paternal Uncle 75  . Stroke Maternal Grandfather 72  . Cancer Paternal Grandmother  68       dx cancer of pancreas and colon, unknown if separate primaries  . Heart Problems Paternal Grandfather        d. 84  . Prostate cancer Maternal Uncle 57  . Breast cancer Maternal Aunt 75  . Breast cancer Maternal Aunt 68  . Cervical cancer Maternal Aunt 68  . Pancreatic cancer Maternal Aunt 54       d. 58y; heavy smoker  . Colon cancer Paternal Uncle 72       s/p partial colectomy; dx. second colon cancer at age 52-64    Social History Social History   Tobacco Use  . Smoking status: Former Smoker    Packs/day: 1.50    Years: 29.00    Pack years: 43.50    Types: Cigarettes    Last attempt to quit: 12/16/2012    Years since quitting: 5.6  . Smokeless tobacco: Never Used  . Tobacco comment: 1-2 ppd from age [age]y to 2014  Substance Use Topics  . Alcohol use: Yes    Alcohol/week: 3.0 standard drinks    Types: 3 Shots of liquor per week    Comment:  some days  . Drug use: No     Allergies   Penicillins   Review of Systems Review of Systems  Ten systems reviewed and are negative for acute change, except as noted in the HPI.   Physical Exam Updated Vital Signs BP 126/88 (BP  Location: Right Arm)   Pulse (!) 109   Temp 98.4 F (36.9 C) (Oral)   SpO2 96%   Physical Exam Vitals signs and nursing note reviewed.  Constitutional:      General: He is not in acute distress.    Appearance: He is well-developed. He is not diaphoretic.  HENT:     Head: Normocephalic and atraumatic.  Eyes:     General: No scleral icterus.    Conjunctiva/sclera: Conjunctivae normal.  Neck:     Musculoskeletal: Normal range of motion and neck supple.  Cardiovascular:     Rate and Rhythm: Normal rate and regular rhythm.     Heart sounds: Normal heart sounds.  Pulmonary:     Effort: Pulmonary effort is normal. No respiratory distress.     Breath sounds: Wheezing and rhonchi present.  Abdominal:     General: There is distension.     Palpations: Abdomen is soft. There is no mass.     Tenderness: There is no abdominal tenderness. There is no guarding.     Hernia: No hernia is present.  Skin:    General: Skin is warm and dry.  Neurological:     Mental Status: He is alert.  Psychiatric:        Behavior: Behavior normal.      ED Treatments / Results  Labs (all labs ordered are listed, but only abnormal results are displayed) Labs Reviewed  CBC WITH DIFFERENTIAL/PLATELET  COMPREHENSIVE METABOLIC PANEL  LIPASE, BLOOD  I-STAT CG4 LACTIC ACID, ED    EKG None  Radiology No results found.  Procedures Procedures (including critical care time)  Medications Ordered in ED Medications  ipratropium-albuterol (DUONEB) 0.5-2.5 (3) MG/3ML nebulizer solution 3 mL (has no administration in time range)     Initial Impression / Assessment and Plan / ED Course  I have reviewed the triage vital signs and the nursing notes.  Pertinent labs & imaging results that were available during my care of the patient were reviewed by me and considered in my  medical decision making (see chart for details).     Patient with coughing and shortness of breath.  I have suspicion that this is  multifactorial given his recent community-acquired pneumonia, reaccumulation of large volume ascites and chest x-ray which shows bilateral pleural effusions.  I suspect mostly he is having reactive airway and had significant improvement in his symptoms with DuoNeb.  His lab results appear to be at baseline with no significant drop in his hemoglobin.  I spent approximately 30 minutes reviewing his recent hospitalization results and explaining his disease progress as best as I was able along with the reason for his fluid accumulation in the abdomen.  Patient does not appear to have any acute illness warranting hospitalization at this time.  I did reach out to his oncologist as I feel the patient has many questions and will need further direction regarding palliative care and likely schedule paracentesis given progression of his metastatic cancer.  Is improved throughout his visit here in the emergency department.  He will be discharged with albuterol and close outpatient follow-up.  I discussed return precautions.  He appears appropriate for discharge at this time  Final Clinical Impressions(s) / ED Diagnoses   Final diagnoses:  Pneumonia    ED Discharge Orders    None       Margarita Mail, PA-C 08/25/18 [id number]    Tegeler, Gwenyth Allegra, MD 08/25/18 1319

## 2018-08-24 LAB — LIPASE, BLOOD: Lipase: 48 U/L (ref 11–51)

## 2018-08-24 LAB — COMPREHENSIVE METABOLIC PANEL
ALT: 10 U/L (ref 0–44)
AST: 19 U/L (ref 15–41)
Albumin: 3.1 g/dL — ABNORMAL LOW (ref 3.5–5.0)
Alkaline Phosphatase: 89 U/L (ref 38–126)
Anion gap: 9 (ref 5–15)
BUN: 9 mg/dL (ref 6–20)
CO2: 27 mmol/L (ref 22–32)
Calcium: 8.6 mg/dL — ABNORMAL LOW (ref 8.9–10.3)
Chloride: 99 mmol/L (ref 98–111)
Creatinine, Ser: 0.82 mg/dL (ref 0.61–1.24)
GFR calc Af Amer: >60 mL/min (ref >60)
GFR calc non Af Amer: >60 mL/min (ref >60)
Glucose, Bld: 146 mg/dL — ABNORMAL HIGH (ref 70–99)
Potassium: 4.2 mmol/L (ref 3.5–5.1)
Sodium: 135 mmol/L (ref 135–145)
Total Bilirubin: 0.7 mg/dL (ref 0.3–1.2)
Total Protein: 5.7 g/dL — ABNORMAL LOW (ref 6.5–8.1)

## 2018-08-24 LAB — BODY FLUID CULTURE: Culture: NO GROWTH

## 2018-08-24 MED ORDER — HEPARIN SOD (PORK) LOCK FLUSH 100 UNIT/ML IV SOLN
500.0000 [IU] | Freq: Once | INTRAVENOUS | Status: AC
  Administered 2018-08-24: 500 [IU]
  Filled 2018-08-24: qty 5

## 2018-08-24 MED ORDER — ALBUTEROL SULFATE 108 (90 BASE) MCG/ACT IN AEPB: 2.0000 | INHALATION_SPRAY | RESPIRATORY_TRACT | 0 refills | Status: DC | PRN

## 2018-08-24 NOTE — Discharge Instructions (Addendum)
Contact a health care provider if: °You have muscle aches. °You have chest pain. °The mucus that you cough up (sputum) changes from clear or white to yellow, green, gray, or bloody. °You have a fever. °Your sputum gets thicker. °Get help right away if: °Your wheezing and coughing get worse, even after you take your prescribed medicines. °It gets even harder to breathe. °You develop severe chest pain. °

## 2018-08-26 ENCOUNTER — Telehealth: Payer: Self-pay | Admitting: *Deleted

## 2018-08-26 DIAGNOSIS — Z8619 Personal history of other infectious and parasitic diseases: Secondary | ICD-10-CM | POA: Diagnosis not present

## 2018-08-26 DIAGNOSIS — J690 Pneumonitis due to inhalation of food and vomit: Secondary | ICD-10-CM | POA: Diagnosis not present

## 2018-08-26 DIAGNOSIS — R180 Malignant ascites: Secondary | ICD-10-CM | POA: Diagnosis not present

## 2018-08-26 NOTE — Telephone Encounter (Signed)
Patient's wife contacted office. Patient saw primary care today and primary care provider referred patient to gastroenterologist for evaluation of ascites. Patient's wife wanted to know if Dr. Irene Limbo would want to refer patient or should they go with the PCP's referral. Advised spouse that whomever PCP referred to would be fine. Spouse had several other questions regarding treatment and test results. Advised her that those questions should be discussed with MD apt patient's next appt on 1/16. She verbalized understanding.

## 2018-08-29 ENCOUNTER — Telehealth: Payer: Self-pay | Admitting: *Deleted

## 2018-08-29 ENCOUNTER — Emergency Department (HOSPITAL_COMMUNITY)
Admission: EM | Admit: 2018-08-29 | Discharge: 2018-08-29 | Disposition: A | Discharge status: Home / Self Care | Payer: 59 | Attending: Emergency Medicine | Admitting: Emergency Medicine

## 2018-08-29 ENCOUNTER — Other Ambulatory Visit: Payer: Self-pay | Admitting: Physician Assistant

## 2018-08-29 ENCOUNTER — Emergency Department (HOSPITAL_COMMUNITY): Payer: 59

## 2018-08-29 ENCOUNTER — Other Ambulatory Visit (HOSPITAL_COMMUNITY): Payer: Self-pay | Admitting: Physician Assistant

## 2018-08-29 ENCOUNTER — Encounter (HOSPITAL_COMMUNITY): Payer: Self-pay

## 2018-08-29 ENCOUNTER — Other Ambulatory Visit: Payer: Self-pay

## 2018-08-29 DIAGNOSIS — R188 Other ascites: Secondary | ICD-10-CM | POA: Diagnosis not present

## 2018-08-29 DIAGNOSIS — R1084 Generalized abdominal pain: Secondary | ICD-10-CM | POA: Insufficient documentation

## 2018-08-29 DIAGNOSIS — E119 Type 2 diabetes mellitus without complications: Secondary | ICD-10-CM | POA: Diagnosis not present

## 2018-08-29 DIAGNOSIS — R140 Abdominal distension (gaseous): Secondary | ICD-10-CM | POA: Diagnosis present

## 2018-08-29 DIAGNOSIS — C189 Malignant neoplasm of colon, unspecified: Secondary | ICD-10-CM | POA: Diagnosis not present

## 2018-08-29 DIAGNOSIS — R180 Malignant ascites: Secondary | ICD-10-CM

## 2018-08-29 DIAGNOSIS — Z79899 Other long term (current) drug therapy: Secondary | ICD-10-CM | POA: Diagnosis not present

## 2018-08-29 DIAGNOSIS — R109 Unspecified abdominal pain: Secondary | ICD-10-CM

## 2018-08-29 DIAGNOSIS — Z87891 Personal history of nicotine dependence: Secondary | ICD-10-CM | POA: Insufficient documentation

## 2018-08-29 LAB — BODY FLUID CELL COUNT WITH DIFFERENTIAL
EOS FL: 1 %
Lymphs, Fluid: 31 %
MONOCYTE-MACROPHAGE-SEROUS FLUID: 66 % (ref 50–90)
Neutrophil Count, Fluid: 2 % (ref 0–25)
Total Nucleated Cell Count, Fluid: 221 cu mm (ref 0–1000)

## 2018-08-29 LAB — LIPASE, BLOOD: LIPASE: 37 U/L (ref 11–51)

## 2018-08-29 LAB — COMPREHENSIVE METABOLIC PANEL
ALT: 10 U/L (ref 0–44)
ANION GAP: 12 (ref 5–15)
AST: 21 U/L (ref 15–41)
Albumin: 3.2 g/dL — ABNORMAL LOW (ref 3.5–5.0)
Alkaline Phosphatase: 128 U/L — ABNORMAL HIGH (ref 38–126)
BUN: 14 mg/dL (ref 6–20)
CO2: 26 mmol/L (ref 22–32)
Calcium: 8.7 mg/dL — ABNORMAL LOW (ref 8.9–10.3)
Chloride: 102 mmol/L (ref 98–111)
Creatinine, Ser: 0.8 mg/dL (ref 0.61–1.24)
GFR calc Af Amer: >60 mL/min (ref >60)
GFR calc non Af Amer: >60 mL/min (ref >60)
Glucose, Bld: 109 mg/dL — ABNORMAL HIGH (ref 70–99)
Potassium: 4.2 mmol/L (ref 3.5–5.1)
SODIUM: 140 mmol/L (ref 135–145)
Total Bilirubin: 1.5 mg/dL — ABNORMAL HIGH (ref 0.3–1.2)
Total Protein: 6.5 g/dL (ref 6.5–8.1)

## 2018-08-29 LAB — CBC WITH DIFFERENTIAL/PLATELET
Abs Immature Granulocytes: 0.07 10*3/uL (ref 0.00–0.07)
BASOS PCT: 1 %
Basophils Absolute: 0.1 10*3/uL (ref 0.0–0.1)
Eosinophils Absolute: 0.5 10*3/uL (ref 0.0–0.5)
Eosinophils Relative: 3 %
HCT: 43 % (ref 39.0–52.0)
Hemoglobin: 12.6 g/dL — ABNORMAL LOW (ref 13.0–17.0)
Immature Granulocytes: 1 %
Lymphocytes Relative: 7 %
Lymphs Abs: 0.9 10*3/uL (ref 0.7–4.0)
MCH: 24.8 pg — ABNORMAL LOW (ref 26.0–34.0)
MCHC: 29.3 g/dL — ABNORMAL LOW (ref 30.0–36.0)
MCV: 84.6 fL (ref 80.0–100.0)
Monocytes Absolute: 1.3 10*3/uL — ABNORMAL HIGH (ref 0.1–1.0)
Monocytes Relative: 10 %
NEUTROS PCT: 78 %
Neutro Abs: 10.4 10*3/uL — ABNORMAL HIGH (ref 1.7–7.7)
Platelets: 428 10*3/uL — ABNORMAL HIGH (ref 150–400)
RBC: 5.08 MIL/uL (ref 4.22–5.81)
RDW: 18 % — ABNORMAL HIGH (ref 11.5–15.5)
WBC: 13.2 10*3/uL — ABNORMAL HIGH (ref 4.0–10.5)
nRBC: 0 % (ref 0.0–0.2)

## 2018-08-29 LAB — ALBUMIN, PLEURAL OR PERITONEAL FLUID: Albumin, Fluid: 2 g/dL

## 2018-08-29 LAB — PROTEIN, PLEURAL OR PERITONEAL FLUID: Total protein, fluid: 3.3 g/dL

## 2018-08-29 LAB — LACTATE DEHYDROGENASE, PLEURAL OR PERITONEAL FLUID: LD, Fluid: 157 U/L — ABNORMAL HIGH (ref 3–23)

## 2018-08-29 LAB — I-STAT CHEM 8, ED
BUN: 11 mg/dL (ref 6–20)
Calcium, Ion: 1.09 mmol/L — ABNORMAL LOW (ref 1.15–1.40)
Chloride: 102 mmol/L (ref 98–111)
Creatinine, Ser: 0.7 mg/dL (ref 0.61–1.24)
Glucose, Bld: 105 mg/dL — ABNORMAL HIGH (ref 70–99)
HCT: 41 % (ref 39.0–52.0)
Hemoglobin: 13.9 g/dL (ref 13.0–17.0)
Potassium: 4.1 mmol/L (ref 3.5–5.1)
Sodium: 138 mmol/L (ref 135–145)
TCO2: 27 mmol/L (ref 22–32)

## 2018-08-29 LAB — PROTIME-INR
INR: 1.1
Prothrombin Time: 14.1 seconds (ref 11.4–15.2)

## 2018-08-29 LAB — GLUCOSE, PLEURAL OR PERITONEAL FLUID: Glucose, Fluid: 80 mg/dL

## 2018-08-29 MED ORDER — ENSURE ENLIVE PO LIQD
237.0000 mL | Freq: Two times a day (BID) | ORAL | Status: AC
  Administered 2018-08-29: 237 mL via ORAL
  Filled 2018-08-29: qty 237

## 2018-08-29 MED ORDER — LIDOCAINE HCL 1 % IJ SOLN
INTRAMUSCULAR | Status: AC
  Filled 2018-08-29: qty 10

## 2018-08-29 MED ORDER — ONDANSETRON HCL 4 MG/2ML IJ SOLN
4.0000 mg | Freq: Once | INTRAMUSCULAR | Status: AC
  Administered 2018-08-29: 4 mg via INTRAVENOUS
  Filled 2018-08-29: qty 2

## 2018-08-29 MED ORDER — IOPAMIDOL (ISOVUE-300) INJECTION 61%
100.0000 mL | Freq: Once | INTRAVENOUS | Status: AC | PRN
  Administered 2018-08-29: 100 mL via INTRAVENOUS

## 2018-08-29 MED ORDER — SODIUM CHLORIDE (PF) 0.9 % IJ SOLN
INTRAMUSCULAR | Status: AC
  Filled 2018-08-29: qty 50

## 2018-08-29 MED ORDER — OXYCODONE HCL ER 10 MG PO T12A
10.0000 mg | EXTENDED_RELEASE_TABLET | Freq: Two times a day (BID) | ORAL | Status: DC
  Administered 2018-08-29: 10 mg via ORAL
  Filled 2018-08-29: qty 1

## 2018-08-29 MED ORDER — HEPARIN SOD (PORK) LOCK FLUSH 100 UNIT/ML IV SOLN
500.0000 [IU] | Freq: Once | INTRAVENOUS | Status: AC
  Administered 2018-08-29: 500 [IU]
  Filled 2018-08-29: qty 5

## 2018-08-29 MED ORDER — FENTANYL CITRATE (PF) 100 MCG/2ML IJ SOLN
100.0000 ug | Freq: Once | INTRAMUSCULAR | Status: AC
  Administered 2018-08-29: 100 ug via INTRAVENOUS
  Filled 2018-08-29: qty 2

## 2018-08-29 MED ORDER — IOPAMIDOL (ISOVUE-300) INJECTION 61%
INTRAVENOUS | Status: AC
  Filled 2018-08-29: qty 100

## 2018-08-29 MED ORDER — LIDOCAINE-EPINEPHRINE (PF) 2 %-1:200000 IJ SOLN
INTRAMUSCULAR | Status: AC
  Administered 2018-08-29: 20 mL
  Filled 2018-08-29: qty 20

## 2018-08-29 MED ORDER — LIDOCAINE-EPINEPHRINE 2 %-1:100000 IJ SOLN
20.0000 mL | Freq: Once | INTRAMUSCULAR | Status: DC
  Filled 2018-08-29: qty 20

## 2018-08-29 NOTE — ED Notes (Signed)
Assisted patient is position change. Elevated legs with pillow.

## 2018-08-29 NOTE — Telephone Encounter (Signed)
Patient's wife called. Patient saw PCP on Friday, was referred to GI doctor today for increased ascites. GI doctor could not do paracentesis today, so recommended patient go to ED with symptoms of increased pain, difficulty breathing and N/V due to ascites. Per wife, patient has had some blood drawn and is currently in CT. They've been advised that once CT is completed, plan is to draw small amount of fluid from abdomen for testing. Thanked wife for information and advised that provider will be updated.

## 2018-08-29 NOTE — ED Notes (Signed)
Patient back from US.

## 2018-08-29 NOTE — ED Provider Notes (Addendum)
[place] DEPT Provider Note   CSN: [id number] Arrival date & time: 08/29/18  1153     History   Chief Complaint No chief complaint on file.   HPI Scott Gallagher is a 48 y.o. male.  HPI 48 year old male comes in a chief complaint of abdominal pain and distention.  Patient has history of diabetes and colon cancer with metastases and malignant ascites.  Patient had his first paracentesis done about a days ago.  According to the patient he started having abdominal distention and pain over the past few days.  He is also having nausea and has had emesis x2.  Denies any associated fever.  Pain is generalized over the abdomen and is nonradiating.  Patient also states that he has difficulty laying flat and feels short of breath.   Past Medical History:  Diagnosis Date  . Anemia   . Arthritis   . Colon cancer ([place]) dx'd 05/2016  . Diabetes mellitus without complication (HCC)    diet controlled  . History of blood transfusion   . Hyperlipidemia   . liver mets dx'd 05/2016  . Sleep apnea    cpap  . Wears glasses     Patient Active Problem List   Diagnosis Date Noted  . Ascites   . Pneumonia of right lower lobe due to infectious organism ([place])   . FTT (failure to thrive) in adult   . Aspiration pneumonia ([place]) 08/18/2018  . Sepsis ([place]) 08/18/2018  . Abdominal pain 08/18/2018  . Nausea & vomiting 08/18/2018  . Chronic anemia 08/18/2018  . Physical deconditioning 08/18/2018  . Anxiety 08/18/2018  . Counseling regarding advanced care planning and goals of care 05/27/2017  . 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 Genetic testing 10/30/2016  . Port catheter in place 09/25/2016  . Family history of colon cancer 07/24/2016  . Family history of prostate cancer 07/24/2016  . Metastatic colon cancer to liver ([place]) 07/01/2016  . Right Colon cancer metastasized to liver ([place]) 06/17/2016  . Chronic GI bleeding 05/29/2016  . Diabetes  mellitus type 2, diet-controlled ([place]) 05/22/2016  . OSA (obstructive sleep apnea) 05/22/2016  . Hyperlipidemia 05/22/2016  . Anemia due to blood loss, chronic   . Antineoplastic chemotherapy induced pancytopenia (CODE) ([place]) 05/21/2016    Past Surgical History:  Procedure Laterality Date  . COLON SURGERY    . IR GENERIC HISTORICAL  09/24/2016   IR CV LINE INJECTION 09/24/2016 [facility]  . IR GENERIC HISTORICAL  09/24/2016   IR US GUIDE VASC ACCESS RIGHT 09/24/2016 [facility]  . IR GENERIC HISTORICAL  09/24/2016   IR FLUORO GUIDE CV LINE RIGHT 09/24/2016 [facility]  . IR GENERIC HISTORICAL  09/30/2016   IR FLUORO GUIDE PORT INSERTION RIGHT 09/30/2016 Markus Daft, MD [facility]  . IR GENERIC HISTORICAL  09/30/2016   IR US GUIDE VASC ACCESS RIGHT 09/30/2016 Markus Daft, MD [facility]  . IR GENERIC HISTORICAL  09/30/2016   IR REMOVAL TUN ACCESS W/ PORT W/O FL MOD SED 09/30/2016 Markus Daft, MD [facility]  . LAPAROSCOPIC RIGHT HEMI COLECTOMY Right 06/17/2016   Procedure: LAPAROSCOPIC ASSISTED  RIGHT HEMI COLECTOMY;  Surgeon: [name], MD;  Location: [facility];  Service: General;  Laterality: Right;  . PORTACATH PLACEMENT Left 07/13/2016   Procedure: INSERTION PORT-A-CATH left subclavian;  Surgeon: [name], MD;  Location: [facility];  Service: General;  Laterality: Left;  . SHOULDER ACROMIOPLASTY Right 12/20/2014   Procedure: SHOULDER ACROMIOPLASTY;  Surgeon: Melrose Nakayama, MD;  Location: [facility];  Service: Orthopedics;  Laterality: Right;  . SHOULDER ARTHROSCOPY Right 12/20/2014   Procedure: RIGHT ARTHROSCOPY SHOULDER WITH DEBRIDEMENT;  Surgeon: Melrose Nakayama, MD;  Location: [facility];  Service: Orthopedics;  Laterality: Right;  . TESTICLE SURGERY     as teen  . TONSILLECTOMY    . WISDOM TOOTH EXTRACTION          Home Medications    Prior to Admission medications   Medication Sig Start Date End Date Taking? Authorizing Provider  Albuterol Sulfate  (PROAIR RESPICLICK) 161 (90 Base) MCG/ACT AEPB Inhale 2 puffs into the lungs every 4 (four) hours as needed (cough and wheezing). 08/24/18  Yes Harris, Abigail, PA-C  dronabinol (MARINOL) 5 MG capsule Take 1 capsule (5 mg total) by mouth 2 (two) times daily before a meal. 07/21/18  Yes Kale, Cloria Spring, MD  feeding supplement, ENSURE ENLIVE, (ENSURE ENLIVE) LIQD Take 237 mLs by mouth 2 (two) times daily between meals. 08/20/18  Yes Florencia Reasons, MD  fentaNYL (DURAGESIC - DOSED MCG/HR) 75 MCG/HR Place 1 patch (75 mcg total) onto the skin every 3 (three) days. 08/08/18  Yes Brunetta Genera, MD  LORazepam (ATIVAN) 0.5 MG tablet Take 1-2 tablets (0.5-1 mg total) by mouth every 8 (eight) hours as needed for anxiety (nausea). 08/04/18  Yes Brunetta Genera, MD  Melatonin 3 MG TABS Take 6 tablets by mouth at bedtime as needed (sleep).  05/14/18  Yes [provider]  Multiple Vitamin (MULTIVITAMIN WITH MINERALS) TABS tablet Take 1 tablet by mouth daily. 08/21/18  Yes Florencia Reasons, MD  ondansetron (ZOFRAN ODT) 4 MG disintegrating tablet Take 1 tablet (4 mg total) by mouth every 8 (eight) hours as needed for nausea or vomiting. 05/08/18  Yes Montine Circle, PA-C  ondansetron (ZOFRAN) 8 MG tablet Take 1 tablet (8 mg total) by mouth 2 (two) times daily as needed for refractory nausea / vomiting. Start on day 3 after chemotherapy. 06/22/18  Yes Brunetta Genera, MD  Oxycodone HCl 10 MG TABS Take 1-2 tablets (10-20 mg total) by mouth every 4 (four) hours as needed. Patient taking differently: Take 10-20 mg by mouth every 4 (four) hours as needed (pain).  08/04/18  Yes Brunetta Genera, MD  polyethylene glycol [facility]) packet Take 17 g by mouth daily. 08/20/18  Yes Florencia Reasons, MD  prochlorperazine (COMPAZINE) 10 MG tablet TAKE 1 TABLET BY MOUTH EVERY 6 HOURS AS NEEDED FOR NAUSEA / VOMITING Patient taking differently: Take 10 mg by mouth every 6 (six) hours as needed for nausea or vomiting. TAKE 1 TABLET BY  MOUTH EVERY 6 HOURS AS NEEDED FOR NAUSEA / VOMITING 08/04/18  Yes Brunetta Genera, MD  senna-docusate (SENNA S) 8.6-50 MG tablet Take 2 tablets by mouth 2 (two) times daily. Patient taking differently: Take 2 tablets by mouth daily.  08/04/18  Yes Brunetta Genera, MD  loperamide (IMODIUM A-D) 2 MG tablet Take 1-2 tablets (2-4 mg total) by mouth 4 (four) times daily as needed for diarrhea or loose stools. Patient not taking: Reported on 08/29/2018 06/22/18   Brunetta Genera, MD    Family History Family History  Problem Relation Age of Onset  . Diabetes Other   . Hyperlipidemia Other   . Hypertension Other   . Breast cancer Maternal Aunt 70  . Prostate cancer Maternal Uncle 75  . Lung cancer Paternal Uncle 72  . Stroke Maternal Grandfather 72  . Cancer Paternal Grandmother 68  dx cancer of pancreas and colon, unknown if separate primaries  . Heart Problems Paternal Grandfather        d. 68  . Prostate cancer Maternal Uncle 4  . Breast cancer Maternal Aunt 75  . Breast cancer Maternal Aunt 68  . Cervical cancer Maternal Aunt 68  . Pancreatic cancer Maternal Aunt 54       d. 58y; heavy smoker  . Colon cancer Paternal Uncle 33       s/p partial colectomy; dx. second colon cancer at age 70-64    Social History Social History   Tobacco Use  . Smoking status: Former Smoker    Packs/day: 1.50    Years: 29.00    Pack years: 43.50    Types: Cigarettes    Last attempt to quit: 12/16/2012    Years since quitting: 5.7  . Smokeless tobacco: Never Used  . Tobacco comment: 1-2 ppd from age [age]y to 2014  Substance Use Topics  . Alcohol use: Yes    Alcohol/week: 3.0 standard drinks    Types: 3 Shots of liquor per week    Comment:  some days  . Drug use: Yes    Types: Marijuana     Allergies   Penicillins   Review of Systems Review of Systems  Constitutional: Positive for activity change.  Respiratory: Positive for shortness of breath.   Gastrointestinal:  Positive for abdominal distention, abdominal pain, nausea and vomiting.  Allergic/Immunologic: Positive for immunocompromised state.  All other systems reviewed and are negative.    Physical Exam Updated Vital Signs BP 138/90 (BP Location: Left Arm)   Pulse 99   Temp 98.4 F (36.9 C) (Oral)   Resp 18   Ht 6\' 2"  (1.88 m)   Wt 99.8 kg   SpO2 94%   BMI 28.25 kg/m   Physical Exam Vitals signs and nursing note reviewed.  Constitutional:      Appearance: He is well-developed.  HENT:     Head: Atraumatic.  Neck:     Musculoskeletal: Neck supple.  Cardiovascular:     Rate and Rhythm: Normal rate.  Pulmonary:     Effort: Pulmonary effort is normal.  Abdominal:     General: There is distension.     Palpations: Abdomen is soft.     Tenderness: There is abdominal tenderness. There is no guarding or rebound.  Skin:    General: Skin is warm.  Neurological:     Mental Status: He is alert and oriented to person, place, and time.      ED Treatments / Results  Labs (all labs ordered are listed, but only abnormal results are displayed) Labs Reviewed  COMPREHENSIVE METABOLIC PANEL - Abnormal; Notable for the following components:      Result Value   Glucose, Bld 109 (*)    Calcium 8.7 (*)    Albumin 3.2 (*)    Alkaline Phosphatase 128 (*)    Total Bilirubin 1.5 (*)    All other components within normal limits  CBC WITH DIFFERENTIAL/PLATELET - Abnormal; Notable for the following components:   WBC 13.2 (*)    Hemoglobin 12.6 (*)    MCH 24.8 (*)    MCHC 29.3 (*)    RDW 18.0 (*)    Platelets 428 (*)    Neutro Abs 10.4 (*)    Monocytes Absolute 1.3 (*)    All other components within normal limits  LACTATE DEHYDROGENASE, PLEURAL OR PERITONEAL FLUID - Abnormal; Notable for the following components:  LD, Fluid 157 (*)    All other components within normal limits  I-STAT CHEM 8, ED - Abnormal; Notable for the following components:   Glucose, Bld 105 (*)    Calcium, Ion 1.09  (*)    All other components within normal limits  BODY FLUID CULTURE  LIPASE, BLOOD  PROTIME-INR  GLUCOSE, PLEURAL OR PERITONEAL FLUID  PROTEIN, PLEURAL OR PERITONEAL FLUID  ALBUMIN, PLEURAL OR PERITONEAL FLUID  URINALYSIS, ROUTINE W REFLEX MICROSCOPIC  CBC WITH DIFFERENTIAL/PLATELET  BODY FLUID CELL COUNT WITH DIFFERENTIAL    EKG None  Radiology Ct Abdomen Pelvis W Contrast  Result Date: 08/29/2018 CLINICAL DATA:  Stage IV ascending colon cancer diagnosed 2017 with liver metastases. Ongoing chemotherapy. Patient presents with generalized abdominal pain and distention, nausea and vomiting. Status post paracentesis 08/20/2018. EXAM: CT ABDOMEN AND PELVIS WITH CONTRAST TECHNIQUE: Multidetector CT imaging of the abdomen and pelvis was performed using the standard protocol following bolus administration of intravenous contrast. CONTRAST:  133mL ISOVUE-300 IOPAMIDOL (ISOVUE-300) INJECTION 61% COMPARISON:  08/18/2018 CT abdomen/pelvis. FINDINGS: Lower chest: Small dependent bilateral pleural effusions, increased bilaterally since 08/18/2018 CT. Mild-to-moderate compressive atelectasis at the dependent lung bases bilaterally. Scattered nodularity along right major fissure measuring up to 5 mm (series 4/image 23), stable. Previously described 9 mm right lower lobe pulmonary nodule on 08/18/2018 CT is obscured by atelectasis on today's scan. Hepatobiliary: Several hypodense lesions scattered throughout the periphery of the liver, not appreciably changed since noncontrast 08/18/2018 CT, increased from 06/12/2018 CT. For example a 5.0 cm posterosuperior right liver lobe lesion (series 2/image 22), increased from 4.5 cm on 06/12/2018 CT. Lateral segment left liver lobe 1.7 cm lesion (series 2/image 35), previously 1.6 cm. Posterior inferior right liver lobe 4.8 cm confluent lesion (series 2/image 39), increased from 3.8 cm. Contracted gallbladder without interval change. CBD stent is in place with the distal  tip in the duodenal lumen. Expected pneumobilia within the nondilated intrahepatic bile ducts. Pancreas: Normal, with no mass or duct dilation. Spleen: Normal size. No mass. Adrenals/Urinary Tract: Normal adrenals. Nonobstructing 2 mm interpolar left renal stone. No hydronephrosis. Simple 1.4 cm interpolar right renal cyst. Simple 2.1 cm lower left renal cyst. Normal bladder. Stomach/Bowel: Normal non-distended stomach. Postsurgical changes from right hemicolectomy with ileocolic anastomosis in the right abdomen. No small bowel dilatation or small bowel wall thickening. No wall thickening within the remnant large-bowel. Vascular/Lymphatic: Atherosclerotic nonaneurysmal abdominal aorta. Patent portal, splenic, hepatic and renal veins. Stable mildly enlarged 1.0 cm porta hepatis node (series 2/image 36). No additional pathologically enlarged lymph nodes in the abdomen or pelvis. Reproductive: Normal size prostate. Other: No pneumoperitoneum. Diffuse peritoneal thickening and enhancement, most parent in the right pericolic gutter, probably unchanged compared to the noncontrast CT on 08/18/2018. Moderate to large volume ascites. Soft tissue caking within the left upper quadrant peritoneal fat, unchanged. Scattered mesenteric nodularity, unchanged, for example a 1.4 cm central mesenteric nodule (series 2/image 58). Musculoskeletal: Irregular hyperenhancing nodular soft tissue along midline ventral laparotomy scar measuring up to 1.6 cm (series 2/image 79), not appreciably changed. Mild thoracolumbar spondylosis. IMPRESSION: 1. 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 2. Scattered liver metastases are mildly increased from 06/12/2018 CT, unchanged from 08/18/2018 CT. 3. Stable mild porta hepatis adenopathy. 4. Small dependent  bilateral pleural effusions, increased bilaterally. Stable mild right lung base pleural nodularity. 5. Stable malignant appearing subcutaneous nodularity  associated with the midline laparotomy scar in the ventral abdomen. 6. Stable well-positioned patent CBD stent. 7. No evidence of bowel obstruction or acute bowel inflammation. 8. Nonobstructing left nephrolithiasis. 9.  Aortic Atherosclerosis (ICD10-I70.0). Electronically Signed   By: Ilona Sorrel M.D.   On: 08/29/2018 14:35    Procedures ABDOMINAL PARACENTESIS Date/Time: 08/29/2018 4:26 PM Performed by: Varney Biles, MD Authorized by: Varney Biles, MD  Consent: Verbal consent obtained. Written consent obtained. Risks and benefits: risks, benefits and alternatives were discussed Consent given by: patient Patient understanding: patient states understanding of the procedure being performed Patient consent: the patient's understanding of the procedure matches consent given Procedure consent: procedure consent matches procedure scheduled Relevant documents: relevant documents present and verified Site marked: the operative site was marked Required items: required blood products, implants, devices, and special equipment available Patient identity confirmed: arm band Time out: Immediately prior to procedure a "time out" was called to verify the correct patient, procedure, equipment, support staff and site/side marked as required. Preparation: Patient was prepped and draped in the usual sterile fashion. Local anesthesia used: yes Anesthesia: local infiltration  Anesthesia: Local anesthesia used: yes Local Anesthetic: lidocaine 2% with epinephrine  Sedation: Patient sedated: no  Patient tolerance: Patient tolerated the procedure well with no immediate complications      EMERGENCY DEPARTMENT [place] ACITES EXAM "Study: Limited Abdominal Ultrasound for Evaluation of Free Fluid"  INDICATIONS: Abd pain and Distention  PERFORMED BY:  Myself IMAGES ARCHIVED?: No VIEWS USES: Right lower quad and Left lower quad INTERPRETATION: Free fluid present      ncluding critical care time)  Medications Ordered in ED Medications  iopamidol (ISOVUE-300) 61 % injection (has no administration in time range)  sodium chloride (PF) 0.9 % injection (has no administration in time range)  oxyCODONE (OXYCONTIN) 12 hr tablet 10 mg (has no administration in time range)  lidocaine (XYLOCAINE) 1 % (with pres) injection (has no administration in time range)  fentaNYL (SUBLIMAZE) injection 100 mcg (100 mcg Intravenous Given 08/29/18 1242)  ondansetron (ZOFRAN) injection 4 mg (4 mg Intravenous Given 08/29/18 1242)  iopamidol (ISOVUE-300) 61 % injection 100 mL (100 mLs Intravenous Contrast Given 08/29/18 1346)  lidocaine-EPINEPHrine (XYLOCAINE W/EPI) 2 %-1:200000 (PF) injection (20 mLs  Given 08/29/18 1455)     Initial Impression / Assessment and Plan / ED Course  I have reviewed the triage vital signs and the nursing notes.  Pertinent labs & imaging results that were available during my care of the patient were reviewed by me and considered in my medical decision making (see chart for details).     48 year old male with history of metastatic colon cancer and malignant ascites comes in with chief complaint of abdominal pain. He is noted to have slightly elevated white count. CT scan of the abdomen is showing nonspecific findings related to his cancer.  No acute changes noted. Diagnostic paracentesis was performed at the bedside, results are pending to rule out SBP.  In the interim, I spoke with IR team who will perform a therapeutic paracentesis. Results of the ER discussed with the patient's family as patient was in the IR suite at the time of update. Dr. Stark Jock to follow-up on the peritoneal fluid results..  Final Clinical Impressions(s) / ED Diagnoses   Final diagnoses:  Abdominal pain  Malignant ascites  Malignant neoplasm of colon,  unspecified part of colon [facility])    ED Discharge Orders    None       Varney Biles, MD 08/29/18 1627  Varney Biles, MD 08/29/18 [phone]

## 2018-08-29 NOTE — ED Triage Notes (Signed)
Patient c/o worsening abdominal pain, N/v, and abdominal distention. Patient states he had fluid drained on 08/20/18 (3.6L). Patient has stage IV colon cancer.

## 2018-08-29 NOTE — ED Notes (Signed)
Patient is in ultrasound and out of the department.

## 2018-08-29 NOTE — ED Notes (Signed)
Patient transported to CT 

## 2018-08-29 NOTE — ED Provider Notes (Signed)
Care assumed from Dr. Kathrynn Humble at shift change.  Patient presenting here with abdominal distention and ascites related to metastatic colon cancer.  He underwent diagnostic and therapeutic paracentesis.  This shows no evidence for infection.  He is feeling better after paracentesis and appears stable for discharge.   Veryl Speak, MD 08/29/18 1819

## 2018-08-29 NOTE — Procedures (Signed)
Ultrasound-guided  therapeutic paracentesis performed yielding 5 liters (maximum ordered) of yellow fluid. No immediate complications. EBL< 1cc.

## 2018-08-29 NOTE — Discharge Instructions (Addendum)
Continue medications as before.  Follow-up with your primary doctor and oncologist in the next week, and return to the ER if symptoms significantly worsen or change.

## 2018-08-30 NOTE — Progress Notes (Signed)
[place] pt to check in following ED visit. The pt reported that he appreciated a call from the counselor, but needs time at home to rest currently so the pt and counselor agreed to end counseling services at this time. The pt was invited to return to counseling as needed. The pt also shared that he welcomed calls from the counselor periodically to check-in.  Doris Cheadle, Counseling Intern [phone]

## 2018-08-31 ENCOUNTER — Ambulatory Visit (HOSPITAL_COMMUNITY): Payer: 59

## 2018-08-31 NOTE — Progress Notes (Signed)
Scott Gallagher    HEMATOLOGY/ONCOLOGY CLINIC NOTE  Date of Service: 09/01/18    Patient Care Team: [name], MD as PCP - General (Family Medicine) [name] M.D. 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 KRAS mutated BRAF, NRAS mutation neg MSI Stable.  PET/CT scan done on 06/25/2016 Show multiple foci of hypermetabolic hepatic metastases (atleast 4-5) and 2 hypermetabolic peritoneal nodules along the dorsal peritoneal surface concerning for peritoneal metastases.  MRI Liver 07/27/2016 - Multiple small liver metastases throughout the right and left hepatic lobes, largest measuring 2.1 cm. 1.2 cm enhancing peritoneal nodule in left paracolic gutter, suspicious for peritoneal metastasis. Other small peritoneal nodules better visualized on recent PET-CT which showed hypermetabolic activity, also suspicious for peritoneal metastases.   CT CHEST WO CONTRAST 10/12/2016  [facility] Result Impression   1. Congenital variant of bilateral middle lobes. 2. Groundglass opacification with regions of bronchiectasis in the right lower lobe adjacent to the fissure, consistent with inflammatory/infectious changes. 3. No definitive evidence of metastatic disease to the chest. 4. Probable sebaceous cyst in the subcutaneous tissues beneath the left anterior chest.   MR ABDOMEN AND PELVIS GNOIBBCW FOLLOW UP2/26/2018 [facility] Result Impression    Decreased size of multiple hepatic lesions and two peritoneal nodules compared to outside MRI 07/27/2016. No new lesions identified in  the abdomen or pelvis.     PREVIOUS TREATMENT  FOLFOX x 10 cycles. 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 (without 5FU bolus - due to thrombocytopenia). S/p Y-90 treatment to 1 hepatic lobe.  Plan for prn hepatic Y90 radio-embolization   INTERVAL HISTORY:     [name] is here for follow-up for his metastatic colon cancer and for C5 FOLFIRI. The patient's last visit with [place] was on 08/04/18. He is accompanied today by his wife.   In the interim the pt was admitted between 08/17/18 and 08/20/18 for pneumonia. At that time, the pt had 3.6 liters removed with abdominal paracentesis. He had an abdominal paracentesis of 5 liters on 08/29/18 as well, which did not show signs of infection.  The pt reports that he hasn't been eating well for the last 5 days, siting no appetite and nausea. He also vomited after trying to consume Ensure a couple days ago. The pt notes that he hasn't moved his bowels in 5 days, and has not taken Senna S twice a day, but rather once a day. The pt denies his pain being uncontrolled. The pt has lost 15 pounds in the last 5 weeks.   The pt has used Zofran and Compazine for nausea relief, and notes that Marinol has been helpful for nausea control and mild appetite stimulation.   The pt notes that when his abdomen fills with fluid, there is an immediate need for relief.  Lab results today (09/01/18) of CBC w/diff and CMP is as follows: all values are WNL except for WBC at 12.7k, HGB at 11.4, HCT at 37.8, MCH at 25.0, RDW at 17.0, ANC at 10.2k,  Monocytes abs at 1.2k, Chloride at 96, Glucose at 126, Calcium at 8.7, Total Protein at 6.2, Albumin at 2.8, Alk Phos at 206. 09/01/18 CEA is pending   On review of systems, pt reports lack of appetite, nausea, vomiting, controlled pain, and denies moving his bowels well, leg swelling, and any  other symptoms.    MEDICAL HISTORY:  Past Medical History:  Diagnosis Date  . Anemia   . Arthritis   . Colon cancer ([place]) dx'd 05/2016  . Diabetes mellitus without complication (HCC)    diet controlled  . History of blood transfusion   . Hyperlipidemia   . liver mets dx'd 05/2016  . Sleep apnea    cpap  . Wears glasses     SURGICAL HISTORY: Past Surgical History:  Procedure Laterality Date  . COLON SURGERY    . IR GENERIC HISTORICAL  09/24/2016   IR CV LINE INJECTION 09/24/2016 [facility]  . IR GENERIC HISTORICAL  09/24/2016   IR US GUIDE VASC ACCESS RIGHT 09/24/2016 [facility]  . IR GENERIC HISTORICAL  09/24/2016   IR FLUORO GUIDE CV LINE RIGHT 09/24/2016 [facility]  . IR GENERIC HISTORICAL  09/30/2016   IR FLUORO GUIDE PORT INSERTION RIGHT 09/30/2016 Markus Daft, MD [facility]  . IR GENERIC HISTORICAL  09/30/2016   IR US GUIDE VASC ACCESS RIGHT 09/30/2016 Markus Daft, MD [facility]  . IR GENERIC HISTORICAL  09/30/2016   IR REMOVAL TUN ACCESS W/ PORT W/O FL MOD SED 09/30/2016 Markus Daft, MD [facility]  . LAPAROSCOPIC RIGHT HEMI COLECTOMY Right 06/17/2016   Procedure: LAPAROSCOPIC ASSISTED  RIGHT HEMI COLECTOMY;  Surgeon: [name], MD;  Location: [facility];  Service: General;  Laterality: Right;  . PORTACATH PLACEMENT Left 07/13/2016   Procedure: INSERTION PORT-A-CATH left subclavian;  Surgeon: [name], MD;  Location: [facility];  Service: General;  Laterality: Left;  . SHOULDER ACROMIOPLASTY Right 12/20/2014   Procedure: SHOULDER ACROMIOPLASTY;  Surgeon: Melrose Nakayama, MD;  Location: [place];  Service: Orthopedics;  Laterality: Right;  . SHOULDER ARTHROSCOPY Right 12/20/2014   Procedure: RIGHT ARTHROSCOPY SHOULDER WITH DEBRIDEMENT;  Surgeon: Melrose Nakayama, MD;  Location: [place];  Service: Orthopedics;  Laterality: Right;  . TESTICLE SURGERY     as teen  . TONSILLECTOMY    . WISDOM TOOTH EXTRACTION      SOCIAL HISTORY: Social  History   Socioeconomic History  . Marital status: Married    Spouse name: Not on file  . Number of children: Not on file  . Years of education: Not on file  . Highest education level: Not on file  Occupational History  . Occupation: cemetery maintenance  Social Needs  . Financial resource strain: Not hard at all  . Food insecurity:    Worry: Patient refused    Inability: Patient refused  . Transportation needs:    Medical: Patient refused    Non-medical: Patient refused  Tobacco Use  . Smoking status: Former Smoker    Packs/day: 1.50    Years: 29.00    Pack years: 43.50    Types: Cigarettes    Last attempt to quit: 12/16/2012    Years since quitting: 5.7  . Smokeless tobacco: Never Used  . Tobacco comment: 1-2 ppd from age [age]y to 2014  Substance and Sexual Activity  . Alcohol use: Yes    Alcohol/week: 3.0 standard drinks    Types: 3 Shots of liquor per week    Comment:  some days  .  Drug use: Yes    Types: Marijuana  . Sexual activity: Not Currently    Birth control/protection: None  Lifestyle  . Physical activity:    Days per week: Patient refused    Minutes per session: Patient refused  . Stress: Only a little  Relationships  . Social connections:    Talks on phone: Patient refused    Gets together: Patient refused    Attends religious service: Patient refused    Active member of club or organization: Patient refused    Attends meetings of clubs or organizations: Patient refused    Relationship status: Patient refused  . Intimate partner violence:    Fear of current or ex partner: Patient refused    Emotionally abused: Patient refused    Physically abused: Patient refused    Forced sexual activity: Patient refused  Other Topics Concern  . Not on file  Social History Narrative  . Not on file    FAMILY HISTORY: Family History  Problem Relation Age of Onset  . Diabetes Other   . Hyperlipidemia Other   . Hypertension Other   . Breast cancer Maternal  Aunt 70  . Prostate cancer Maternal Uncle 75  . Lung cancer Paternal Uncle 70  . Stroke Maternal Grandfather 72  . Cancer Paternal Grandmother 47       dx cancer of pancreas and colon, unknown if separate primaries  . Heart Problems Paternal Grandfather        d. 24  . Prostate cancer Maternal Uncle 53  . Breast cancer Maternal Aunt 75  . Breast cancer Maternal Aunt 68  . Cervical cancer Maternal Aunt 68  . Pancreatic cancer Maternal Aunt 54       d. 58y; heavy smoker  . Colon cancer Paternal Uncle 16       s/p partial colectomy; dx. second colon cancer at age 41-64    ALLERGIES:  is allergic to penicillins.  MEDICATIONS:  Current Outpatient Medications  Medication Sig Dispense Refill  . Albuterol Sulfate (PROAIR RESPICLICK) 245 (90 Base) MCG/ACT AEPB Inhale 2 puffs into the lungs every 4 (four) hours as needed (cough and wheezing). 1 each 0  . dronabinol (MARINOL) 5 MG capsule Take 1 capsule (5 mg total) by mouth 2 (two) times daily before a meal. 60 capsule 0  . feeding supplement, ENSURE ENLIVE, (ENSURE ENLIVE) LIQD Take 237 mLs by mouth 2 (two) times daily between meals. 237 mL 12  . fentaNYL (DURAGESIC - DOSED MCG/HR) 75 MCG/HR Place 1 patch (75 mcg total) onto the skin every 3 (three) days. 10 patch 0  . loperamide (IMODIUM A-D) 2 MG tablet Take 1-2 tablets (2-4 mg total) by mouth 4 (four) times daily as needed for diarrhea or loose stools. (Patient not taking: Reported on 08/29/2018) 100 tablet 1  . LORazepam (ATIVAN) 0.5 MG tablet Take 1-2 tablets (0.5-1 mg total) by mouth every 8 (eight) hours as needed for anxiety (nausea). 60 tablet 0  . Melatonin 3 MG TABS Take 6 tablets by mouth at bedtime as needed (sleep).   0  . Multiple Vitamin (MULTIVITAMIN WITH MINERALS) TABS tablet Take 1 tablet by mouth daily. 30 tablet 0  . ondansetron (ZOFRAN ODT) 4 MG disintegrating tablet Take 1 tablet (4 mg total) by mouth every 8 (eight) hours as needed for nausea or vomiting. 10 tablet 0  .  ondansetron (ZOFRAN) 8 MG tablet Take 1 tablet (8 mg total) by mouth 2 (two) times daily as needed for refractory nausea /  vomiting. Start on day 3 after chemotherapy. 30 tablet 1  . Oxycodone HCl 10 MG TABS Take 1-2 tablets (10-20 mg total) by mouth every 4 (four) hours as needed. (Patient taking differently: Take 10-20 mg by mouth every 4 (four) hours as needed (pain). ) 120 tablet 0  . polyethylene glycol (MIRALAX) packet Take 17 g by mouth daily. 30 each 1  . prochlorperazine (COMPAZINE) 10 MG tablet TAKE 1 TABLET BY MOUTH EVERY 6 HOURS AS NEEDED FOR NAUSEA / VOMITING (Patient taking differently: Take 10 mg by mouth every 6 (six) hours as needed for nausea or vomiting. TAKE 1 TABLET BY MOUTH EVERY 6 HOURS AS NEEDED FOR NAUSEA / VOMITING) 30 tablet 1  . senna-docusate (SENNA S) 8.6-50 MG tablet Take 2 tablets by mouth 2 (two) times daily. (Patient taking differently: Take 2 tablets by mouth daily. ) 120 tablet 2   No current facility-administered medications for this visit.     REVIEW OF SYSTEMS:    A 10+ POINT REVIEW OF SYSTEMS WAS OBTAINED including neurology, dermatology, psychiatry, cardiac, respiratory, lymph, extremities, GI, GU, Musculoskeletal, constitutional, breasts, reproductive, HEENT.  All pertinent positives are noted in the HPI.  All others are negative.    PHYSICAL EXAMINATION:  ECOG PERFORMANCE STATUS: 1 - Symptomatic but completely ambulatory  GENERAL:alert, in no acute distress and comfortable SKIN: no acute rashes, no significant lesions EYES: conjunctiva are pink and non-injected, sclera anicteric OROPHARYNX: MMM, no exudates, no oropharyngeal erythema or ulceration NECK: supple, no JVD LYMPH:  no palpable lymphadenopathy in the cervical, axillary or inguinal regions LUNGS: clear to auscultation b/l with normal respiratory effort HEART: regular rate & rhythm ABDOMEN:  normoactive bowel sounds , some mild tenderness to palpation in left central abdomen, not distended.  No palpable hepatosplenomegaly. Healed surgical incision. Extremity: no pedal edema PSYCH: alert & oriented x 3 with fluent speech NEURO: no focal motor/sensory deficits  LABORATORY DATA:  I have reviewed the data as listed  .Scott Gallagher CBC Latest Ref Rng & Units 09/01/2018 08/29/2018 08/29/2018  WBC 4.0 - 10.5 K/uL 12.7(H) - 13.2(H)  Hemoglobin 13.0 - 17.0 g/dL 11.4(L) 13.9 12.6(L)  Hematocrit 39.0 - 52.0 % 37.8(L) 41.0 43.0  Platelets 150 - 400 K/uL 346 - 428(H)   . CMP Latest Ref Rng & Units 09/01/2018 08/29/2018 08/29/2018  Glucose 70 - 99 mg/dL 126(H) 105(H) 109(H)  BUN 6 - 20 mg/dL _0 Creatinine 0.61 - 1.24 mg/dL 0.81 0.70 0.80  Sodium 135 - 145 mmol/L 137 138 140  Potassium 3.5 - 5.1 mmol/L 4.2 4.1 4.2  Chloride 98 - 111 mmol/L 96(L) 102 102  CO2 22 - 32 mmol/L 30 - 26  Calcium 8.9 - 10.3 mg/dL 8.7(L) - 8.7(L)  Total Protein 6.5 - 8.1 g/dL 6.2(L) - 6.5  Total Bilirubin 0.3 - 1.2 mg/dL 1.0 - 1.5(H)  Alkaline Phos 38 - 126 U/L 206(H) - 128(H)  AST 15 - 41 U/L 21 - 21  ALT 0 - 44 U/L 7 - 10   04/14/18 Surgical Pathology:   .    Microscopic Comment 2. COLON AND RECTUM (INCLUDING TRANS-ANAL RESECTION): Specimen: Terminal ileum, right colon and appendix Procedure: Segmental resection Tumor site: Proximal ascending colon Specimen integrity: Intact Macroscopic intactness of mesorectum: Not applicable: x Complete: NA Near complete: NA Incomplete: NA Cannot be determined (specify): NA Macroscopic tumor perforation: The mass invades through muscularis propria into pericolonic soft tissue Invasive tumor: Maximum size: 5.5 cm Histologic type(s): Adenocarcinoma Histologic grade and differentiation: G3  G1: well differentiated/low grade 1 of 4 Supplemental copy SUPPLEMENTAL for Cormier, [place] ([id number]) Microscopic Comment(continued) G2: moderately differentiated/low grade G3: poorly differentiated/high grade G4: undifferentiated/high grade Type of polyp in which  invasive carcinoma arose: Tubular adenoma Microscopic extension of invasive tumor: The mass invade through the muscularis propria into pericolonic soft tissue Lymph-Vascular invasion: Identified Peri-neural invasion: Identified Tumor deposit(s) (discontinuous extramural extension): Present Resection margins: Proximal margin: Negative Distal margin: Negative Circumferential (radial) (posterior ascending, posterior descending; lateral and posterior mid-rectum; and entire lower 1/3 rectum):Negative Mesenteric margin (sigmoid and transverse): NA Distance closest margin (if all above margins negative): 3.7 cm from the non peritonealized pericolic soft tissue margin Trans-anal resection margins only: Deep margin: NA Mucosal Margin: NA Distance closest mucosal margin (if negative): NA Treatment effect (neo-adjuvant therapy): NA Additional polyp(s): Negative Non-neoplastic findings: Unremarkable Lymph nodes: number examined 18; number positive: 5 Pathologic Staging: pT3, N2a, M1a Ancillary studies: MSI ordered    07/23/16 Molecular Pathology:     RADIOGRAPHIC STUDIES: I have personally reviewed the radiological images as listed and agreed with the findings in the report. CT abd/pelvis 12/09/2017: IMPRESSION: 1. Heterogeneous hepatic steatosis. Given this mild limitation, no evidence of progressive hepatic metastasis. Similar nonspecific capsular irregularity along the inferior right hepatic lobe. 2. Suspect developing omental/peritoneal metastasis, as evidenced by new and increased omental nodularity. Trace cul-de-sac fluid is nonspecific but could also be secondary. 3.  No acute process or evidence of metastatic disease in the chest. 4. Age advanced coronary artery atherosclerosis. Recommend assessment of coronary risk factors and consideration of medical therapy. 5.  Aortic Atherosclerosis (ICD10-I70.0). 6. Possible developing cirrhosis.  Correlate with risk  factors.   Electronically Signed   By: Abigail Miyamoto M.D.   On: 12/09/2017 14:23    MR LIVER WWO CONTRAST2/14/2019 [facility] Result Impression   1.Decreased sizes of multiple T2 hyperintense metastatic lesions (in the setting of patient's known colon adenocarcinoma with mucinous features) in both hepatic lobes (the majority of which are tiny). 2.No new lesions. 3.Posttreatment changes within the right hepatic lobe. 4.Other findings, as detailed above.  Result Narrative  MR LIVER WITH AND WITHOUT CONTRAST, 09/30/2017 10:20 AM  INDICATION: liver malignancy \ liver malignancy \ C18.9 Metastatic colon cancer to liver (HCC) \ C78.7 Metastatic colon cancer to liver ([place])  ADDITIONAL HISTORY: None. COMPARISON: MRI abdomen dated 07/01/2017.  TECHNIQUE: Multiplanar, multisequence MR images of the upper abdomen were obtained before and after intravenous administration of gadolinium-based contrast.   FINDINGS:  LOWER CHEST Heart: Normal size. No pericardial effusion. Lungs/pleura: No masses, consolidations, or effusions.  ABDOMEN Liver: Changes of prior radioembolization in the right hepatic lobe. Perfusional anomaly in the posterior right hepatic lobe, likely a venovenous shunt. Decreased sizes of multiple T2 hyperintense metastatic lesions throughout both hepatic lobes (the majority of which are tiny and are marked in PACS on series 5), with the dominant lesion measuring 10 mm in segment 8 (series 5, image 19), versus 12 mm previously. No new lesions. Gallbladder: Normal. No stones or inflammatory changes. Biliary: No obstruction. Spleen: Splenomegaly, with the spleen measuring 15.2 cm in span. Pancreas: Normal. Adrenals: Normal. Kidneys: No masses, stones, or obstruction. Unchanged fluid signal lesions in both kidneys, measuring up to 2.0 cm in the interpolar region of the left kidney. Stomach/bowel: Unremarkable.    07/29/18 MRI  Liver:    ASSESSMENT & PLAN:   48 y.o. Caucasian male with  1)  Stage IV (pT3, N2a, M1a) Grade 3 invasive adenocarcinoma of the ascending  colon with lymphovascular and perineural invasion with slight leg nodules biopsy-proven liver metastasis. KRAS mutated BRAF, NRAS mutation neg MSI Stable.  PET/CT scan done on 06/25/2016 Show multiple foci of hypermetabolic hepatic metastases (atleast 4-5) and 2 hypermetabolic peritoneal nodules along the dorsal peritoneal surface concerning for peritoneal metastases.  MRI Liver 07/27/2016 - Multiple small liver metastases throughout the right and left hepatic lobes, largest measuring 2.1 cm. 1.2 cm enhancing peritoneal nodule in left paracolic gutter, suspicious for peritoneal metastasis. Other small peritoneal nodules better visualized on recent PET-CT which showed hypermetabolic activity, also suspicious for peritoneal metastases.   MRI Abd 10/12/2016 at [facility] --shows improvement in all the liver and the 2 peritoneal lesions and no new lesions.  MRI liver and CT C/A/P on 01/01/2017 as noted above showed improved/stable disease.   MRI Liver 07/01/2017 at [facility] -  1.Three T2 hyperintense metastatic lesions are seen in the liver, with the largest again in segment 8. These appear unchanged from prior, again without appreciable enhancement or restricted diffusion. Several of the previously noted tiny lesions are no longer visualized. No new lesions identified.  2.Treatment related changes in the posterior right hepatic lobe. 3.A peritoneal nodule adjacent to the splenic flexure is now more conspicuous compared to the prior study but appears similar to more remote imaging and again could relate to neoplastic disease, although is indeterminate.  CT Abd/pelvis 08/23/2017: Interval resolution of previously seen small right hepatic lobe lesion. Stable tiny sub-cm nodule along the capsular surface of the right hepatic lobe. No new or progressive disease  identified. Stable hepatic steatosis with areas of fatty sparing.  MRI 09/30/17:  Decreased sizes of multiple T2 hyperintense metastatic lesions (in the setting of patient's known colon adenocarcinoma with mucinous features) in both hepatic lobes (the majority of which are tiny). No new lesions. Posttreatment changes within the right hepatic lobe. Other findings, as detailed above.   12/09/17 CT C/A/P with pt which revealed Suspected developing omental/peritoneal metastasis, as evidenced by new and increased omental nodularity.  -CEA levels are slowing increasing and on 4/419 were increased to 11.87 and today are up to 27.26  02/10/18 MRI Liver Post treatment changes in the posterior right lobe. No significant change in the peripherally enhancing cystic lesion in segment 6 and another subcentimeter punctate T2 hyperintensity is identified in segment 7. The remaining previously reported lesions are not seen on today's exam. Continued attention on follow-up imaging is advised. 2.Interval development of multiple new peritoneal implants in the right anterior abdomen as detailed above and interval increase in size of the implant adjacent to the splenic flexure  02/21/18 CT C/A/P revealed Multiple scattered peritoneal implants in the upper to mid abdomen are similar compared to 02/10/2018.2.Additional tiny soft tissue implants in the lower abdomen and pelvis are technically new compared to 01/01/2017. Advise continued attention on follow-up.   05/08/18 CT A/P revealed Coronary arteriosclerosis. 2. Steatosis of the liver with indeterminate areas of hypodensity within the right hepatic lobe possibly representing post treatment change. New subtle lesions are not entirely excluded. Short-term interval follow-up and attention to these areas is recommended. MRI may also prove useful for better assessment with IV contrast. 3. Left upper pole parapelvic cyst, stable in appearance measuring approximately 2 cm in diameter.  No worrisome features. 4. Small mesenteric nodules consistent with mildly enlarged mesenteric lymph nodes or peritoneal metastasis are noted in the right hemiabdomen and ventral upper abdomen. These are not definitively identified on prior exam and suspect these are new or have  enlarged in the interim. 5. Mechanical bowel obstruction or inflammation. 6. New ventral midline surgical scarring post surgical change suspected in the mesentery.   06/12/18 CT A/P revealed Increased size and number of liver metastases. 2. Gallbladder is contracted and has a thickened wall, suspicious for acute cholecystitis. Unchanged appearance of biliary stent. 3. Recommend further evaluation with RIGHT UPPER QUADRANT ultrasound evaluated gallbladder. 4. Splenomegaly. 5. Increased mesenteric nodules, consistent with metastatic disease. 6. Small, irregular collections of mesenteric and pelvic fluid, suspicious for metastatic disease. 7.  Aortic atherosclerosis.   07/29/18 MRI Liver did not reveal anything new from the last CT A/P from 06/12/18  2) Mild thrombocytopenia- Resolved . PLT at 163k on 07/21/18.   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 2. Scattered liver metastases are mildly increased from 06/12/2018 CT, unchanged from 08/18/2018 CT. 3. Stable mild porta hepatis adenopathy. 4. Small dependent bilateral pleural effusions, increased bilaterally. Stable mild right lung base pleural nodularity. 5. Stable malignant appearing subcutaneous nodularity associated with the midline laparotomy scar in the ventral abdomen. 6. Stable  well-positioned patent CBD stent. 7. No evidence of bowel obstruction or acute bowel inflammation. 8. Nonobstructing left nephrolithiasis. 9.  Aortic Atherosclerosis -Discussed the patient's goals of care again at this time, in light of a concern for his ability to keep up his nutritional status and not symptomatically benefiting from palliative chemotherapy at this time. Pt understands and would prefer to move forward with treatment as planned for today. -Discussed that the pt may qualify for immunotherapy based on molecular studies that showed mutations in a pathway that could yield responsiveness to immunotherapy. Pt understands this to be a possibility, not an assurance.  -Will discuss with my colleagues regarding the indications for immunotherapy given the patient's specific mutations.  -Will tentatively begin Nivolumab in 1-2 weeks, after completing C5 FOLFIRI today -Will set pt up for home care services and a needs assessment  -Will refer the pt to IR for catheter placement given frequency and volume of ascites development  -The pt has no prohibitive toxicities from completing C5 FOLFIRI at this time. -Will refill Marinol, Zofran, 27m Oxycodone and 71m/hr Fentanyl patch today  -Discussed that the patient should focus on maintaining his nutrition and do his best to eat well, and take enough laxatives to move his bowels well -Two tablets of Senna S BID and Miralax as needed -Added Pepcid and Benadryl to pre-medications to help with the flushing -Aloxi was added on for third day following infusion to better control delayed nausea. 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 -resolved   #5  Patient Active Problem List   Diagnosis Date Noted  . Ascites    . Pneumonia of right lower lobe due to infectious organism ([place]  . FTT (failure to thrive) in adult   . Aspiration pneumonia ([place]01/09/2018  . Sepsis ([place]01/09/2018  . Abdominal pain 08/18/2018  .  Nausea & vomiting 08/18/2018  . Chronic anemia 08/18/2018  . Physical deconditioning 08/18/2018  . Anxiety 08/18/2018  . Counseling regarding advanced care planning and goals of care 05/27/2017  . Chemotherapy-induced neuropathy ([place]) 11/18/2016  . Genetic testing 10/30/2016  . Port catheter in place 09/25/2016  . Family history of colon cancer 07/24/2016  . Family history of prostate cancer 07/24/2016  . Metastatic colon cancer to liver ([place]) 07/01/2016  . Right Colon cancer metastasized to liver ([place]) 06/17/2016  . Chronic GI bleeding 05/29/2016  . Diabetes mellitus type 2, diet-controlled ([place]) 05/22/2016  . OSA (obstructive sleep apnea) 05/22/2016  . Hyperlipidemia 05/22/2016  . Anemia due to blood loss, chronic   . Antineoplastic chemotherapy induced pancytopenia (CODE) ([place]) 05/21/2016   Plan:  -Continue follow-up with primary care physician [name], MD for optimization of diabetes management - now on basal insulin with improving control.  -Continue use of CPAP for sleep apnea.   IR consultation for indwelling peritoneal drain placement Discontinuing FOLFIRI Schedule to start Nivolumab q2weeks beginning in 1 week with labs Referral to advanced home care for catheter management, RN support and PT RTC with dr Irene Limbo with 1st dose of Nivolumab    All of the patients questions were answered with apparent satisfaction. The patient knows to call the clinic with any problems, questions or concerns.  The total time spent in the appt was 40 minutes and more than 50% was on counseling and direct patient cares.   Sullivan Lone MD [place] AAHIVMS [facility] Hematology/Oncology Physician [facility]  (Office):       [phone] (Work cell):  [phone] (Fax):            [phone]  I, Baldwin Jamaica, am acting as a scribe for Dr. Sullivan Lone.   .I have reviewed the above documentation for accuracy and completeness, and I agree with the above. Brunetta Genera MD

## 2018-09-01 ENCOUNTER — Inpatient Hospital Stay: Payer: 59

## 2018-09-01 ENCOUNTER — Inpatient Hospital Stay: Payer: 59 | Attending: Hematology

## 2018-09-01 ENCOUNTER — Inpatient Hospital Stay (HOSPITAL_BASED_OUTPATIENT_CLINIC_OR_DEPARTMENT_OTHER): Payer: 59 | Admitting: Hematology

## 2018-09-01 ENCOUNTER — Telehealth: Payer: Self-pay

## 2018-09-01 VITALS — HR 99

## 2018-09-01 VITALS — BP 121/88 | HR 110 | Temp 98.5°F | Resp 18 | Ht 74.0 in | Wt 213.9 lb

## 2018-09-01 DIAGNOSIS — C189 Malignant neoplasm of colon, unspecified: Secondary | ICD-10-CM

## 2018-09-01 DIAGNOSIS — C787 Secondary malignant neoplasm of liver and intrahepatic bile duct: Secondary | ICD-10-CM

## 2018-09-01 DIAGNOSIS — Z7189 Other specified counseling: Secondary | ICD-10-CM

## 2018-09-01 DIAGNOSIS — C786 Secondary malignant neoplasm of retroperitoneum and peritoneum: Secondary | ICD-10-CM

## 2018-09-01 DIAGNOSIS — D649 Anemia, unspecified: Secondary | ICD-10-CM | POA: Diagnosis not present

## 2018-09-01 DIAGNOSIS — E119 Type 2 diabetes mellitus without complications: Secondary | ICD-10-CM

## 2018-09-01 DIAGNOSIS — G893 Neoplasm related pain (acute) (chronic): Secondary | ICD-10-CM

## 2018-09-01 DIAGNOSIS — J181 Lobar pneumonia, unspecified organism: Secondary | ICD-10-CM | POA: Diagnosis not present

## 2018-09-01 DIAGNOSIS — Z87891 Personal history of nicotine dependence: Secondary | ICD-10-CM

## 2018-09-01 DIAGNOSIS — Z79899 Other long term (current) drug therapy: Secondary | ICD-10-CM

## 2018-09-01 LAB — CMP (CANCER CENTER ONLY)
ALT: 7 U/L (ref 0–44)
AST: 21 U/L (ref 15–41)
Albumin: 2.8 g/dL — ABNORMAL LOW (ref 3.5–5.0)
Alkaline Phosphatase: 206 U/L — ABNORMAL HIGH (ref 38–126)
Anion gap: 11 (ref 5–15)
BUN: 12 mg/dL (ref 6–20)
CHLORIDE: 96 mmol/L — AB (ref 98–111)
CO2: 30 mmol/L (ref 22–32)
Calcium: 8.7 mg/dL — ABNORMAL LOW (ref 8.9–10.3)
Creatinine: 0.81 mg/dL (ref 0.61–1.24)
GFR, Est AFR Am: >60 mL/min (ref >60)
Glucose, Bld: 126 mg/dL — ABNORMAL HIGH (ref 70–99)
POTASSIUM: 4.2 mmol/L (ref 3.5–5.1)
Sodium: 137 mmol/L (ref 135–145)
Total Bilirubin: 1 mg/dL (ref 0.3–1.2)
Total Protein: 6.2 g/dL — ABNORMAL LOW (ref 6.5–8.1)

## 2018-09-01 LAB — CBC WITH DIFFERENTIAL/PLATELET
Abs Immature Granulocytes: 0.06 10*3/uL (ref 0.00–0.07)
Basophils Absolute: 0.1 10*3/uL (ref 0.0–0.1)
Basophils Relative: 1 %
Eosinophils Absolute: 0.5 10*3/uL (ref 0.0–0.5)
Eosinophils Relative: 4 %
HCT: 37.8 % — ABNORMAL LOW (ref 39.0–52.0)
Hemoglobin: 11.4 g/dL — ABNORMAL LOW (ref 13.0–17.0)
Immature Granulocytes: 1 %
Lymphocytes Relative: 6 %
Lymphs Abs: 0.7 10*3/uL (ref 0.7–4.0)
MCH: 25 pg — ABNORMAL LOW (ref 26.0–34.0)
MCHC: 30.2 g/dL (ref 30.0–36.0)
MCV: 82.9 fL (ref 80.0–100.0)
Monocytes Absolute: 1.2 10*3/uL — ABNORMAL HIGH (ref 0.1–1.0)
Monocytes Relative: 9 %
Neutro Abs: 10.2 10*3/uL — ABNORMAL HIGH (ref 1.7–7.7)
Neutrophils Relative %: 79 %
Platelets: 346 10*3/uL (ref 150–400)
RBC: 4.56 MIL/uL (ref 4.22–5.81)
RDW: 17 % — ABNORMAL HIGH (ref 11.5–15.5)
WBC: 12.7 10*3/uL — AB (ref 4.0–10.5)
nRBC: 0 % (ref 0.0–0.2)

## 2018-09-01 LAB — CEA (IN HOUSE-CHCC): CEA (CHCC-IN HOUSE): 64.65 ng/mL — AB (ref 0.00–5.00)

## 2018-09-01 MED ORDER — SODIUM CHLORIDE 0.9 % IV SOLN
180.0000 mg/m2 | Freq: Once | INTRAVENOUS | Status: AC
  Administered 2018-09-01: 440 mg via INTRAVENOUS
  Filled 2018-09-01: qty 15

## 2018-09-01 MED ORDER — PALONOSETRON HCL INJECTION 0.25 MG/5ML
0.2500 mg | Freq: Once | INTRAVENOUS | Status: AC
  Administered 2018-09-01: 0.25 mg via INTRAVENOUS

## 2018-09-01 MED ORDER — SODIUM CHLORIDE 0.9 % IV SOLN
400.0000 mg/m2 | Freq: Once | INTRAVENOUS | Status: AC
  Administered 2018-09-01: 956 mg via INTRAVENOUS
  Filled 2018-09-01: qty 47.8

## 2018-09-01 MED ORDER — DIPHENHYDRAMINE HCL 50 MG/ML IJ SOLN
INTRAMUSCULAR | Status: AC
  Filled 2018-09-01: qty 1

## 2018-09-01 MED ORDER — SODIUM CHLORIDE 0.9 % IV SOLN
2420.0000 mg/m2 | INTRAVENOUS | Status: DC
  Administered 2018-09-01: 5800 mg via INTRAVENOUS
  Filled 2018-09-01: qty 116

## 2018-09-01 MED ORDER — SODIUM CHLORIDE 0.9 % IV SOLN
Freq: Once | INTRAVENOUS | Status: AC
  Administered 2018-09-01: 11:00:00 via INTRAVENOUS
  Filled 2018-09-01: qty 5

## 2018-09-01 MED ORDER — LOPERAMIDE HCL 2 MG PO CAPS
ORAL_CAPSULE | ORAL | Status: AC
  Filled 2018-09-01: qty 1

## 2018-09-01 MED ORDER — SODIUM CHLORIDE 0.9 % IV SOLN
Freq: Once | INTRAVENOUS | Status: DC
  Filled 2018-09-01: qty 250

## 2018-09-01 MED ORDER — FLUOROURACIL CHEMO INJECTION 2.5 GM/50ML
400.0000 mg/m2 | Freq: Once | INTRAVENOUS | Status: AC
  Administered 2018-09-01: 950 mg via INTRAVENOUS
  Filled 2018-09-01: qty 19

## 2018-09-01 MED ORDER — FAMOTIDINE IN NACL 20-0.9 MG/50ML-% IV SOLN
INTRAVENOUS | Status: AC
  Filled 2018-09-01: qty 50

## 2018-09-01 MED ORDER — SODIUM CHLORIDE 0.9 % IV SOLN
Freq: Once | INTRAVENOUS | Status: AC
  Administered 2018-09-01: 11:00:00 via INTRAVENOUS
  Filled 2018-09-01: qty 250

## 2018-09-01 MED ORDER — ATROPINE SULFATE 1 MG/ML IJ SOLN
0.5000 mg | Freq: Once | INTRAMUSCULAR | Status: AC | PRN
  Administered 2018-09-01: 0.5 mg via INTRAVENOUS

## 2018-09-01 MED ORDER — ATROPINE SULFATE 1 MG/ML IJ SOLN
INTRAMUSCULAR | Status: AC
  Filled 2018-09-01: qty 1

## 2018-09-01 MED ORDER — LOPERAMIDE HCL 2 MG PO TABS
2.0000 mg | ORAL_TABLET | Freq: Once | ORAL | Status: DC
  Filled 2018-09-01: qty 1

## 2018-09-01 MED ORDER — DIPHENHYDRAMINE HCL 50 MG/ML IJ SOLN
25.0000 mg | Freq: Once | INTRAMUSCULAR | Status: AC
  Administered 2018-09-01: 25 mg via INTRAVENOUS

## 2018-09-01 MED ORDER — PALONOSETRON HCL INJECTION 0.25 MG/5ML
INTRAVENOUS | Status: AC
  Filled 2018-09-01: qty 5

## 2018-09-01 MED ORDER — FAMOTIDINE IN NACL 20-0.9 MG/50ML-% IV SOLN
20.0000 mg | Freq: Once | INTRAVENOUS | Status: AC
  Administered 2018-09-01: 20 mg via INTRAVENOUS

## 2018-09-01 NOTE — Patient Instructions (Signed)
Thank you for choosing [facility] to provide your oncology and hematology care.  To afford each patient quality time with our providers, please arrive 30 minutes before your scheduled appointment time.  If you arrive late for your appointment, you may be asked to reschedule.  We strive to give you quality time with our providers, and arriving late affects you and other patients whose appointments are after yours.    If you are a no show for multiple scheduled visits, you may be dismissed from the clinic at the providers discretion.     Again, thank you for choosing [facility], our hope is that these requests will decrease the amount of time that you wait before being seen by our physicians.  ______________________________________________________________________   Should you have questions after your visit to the [facility], please contact our office at [phone] between the hours of 8:30 and 4:30 p.m.    Voicemails left after 4:30p.m will not be returned until the following business day.     For prescription refill requests, please have your pharmacy contact us directly.  Please also try to allow 48 hours for prescription requests.     Please contact the scheduling department for questions regarding scheduling.  For scheduling of procedures such as PET scans, CT scans, MRI, Ultrasound, etc please contact central scheduling at [phone].     Resources For Cancer Patients and Caregivers:    Oncolink.org:  A wonderful resource for patients and healthcare providers for information regarding your disease, ways to tract your treatment, what to expect, etc.      American Cancer Society:  800-227-2345  Can help patients locate various types of support and financial assistance   Cancer Care: 1-800-813-HOPE (4673) Provides financial assistance, online support groups, medication/co-pay assistance.     [place] DSS:  336-641-3447 Where to apply  for food stamps, Medicaid, and utility assistance   Medicare Rights Center: 800-333-4114 Helps people with Medicare understand their rights and benefits, navigate the Medicare system, and secure the quality healthcare they deserve   SCAT: 336-333-6589 [employer] [employer]'s shared-ride transportation service for eligible riders who have a disability that prevents them from riding the fixed route bus.     For additional information on assistance programs please contact our social worker:   Abigail Elmore:  [phone]  

## 2018-09-01 NOTE — Telephone Encounter (Signed)
Received new referral from PCP office for Palliative Care. Phone call placed to patient's wife to schedule visit with Palliative Care. Visit scheduled for 09/02/2018

## 2018-09-01 NOTE — Patient Instructions (Signed)
Scott Gallagher Discharge Instructions for Patients Receiving Chemotherapy  Today you received the following chemotherapy agents Irinotecan, Leucovorin, 5FU.  To help prevent nausea and vomiting after your treatment, we encourage you to take your nausea medication.   If you develop nausea and vomiting that is not controlled by your nausea medication, call the clinic.   BELOW ARE SYMPTOMS THAT SHOULD BE REPORTED IMMEDIATELY:  *FEVER GREATER THAN 100.5 F  *CHILLS WITH OR WITHOUT FEVER  NAUSEA AND VOMITING THAT IS NOT CONTROLLED WITH YOUR NAUSEA MEDICATION  *UNUSUAL SHORTNESS OF BREATH  *UNUSUAL BRUISING OR BLEEDING  TENDERNESS IN MOUTH AND THROAT WITH OR WITHOUT PRESENCE OF ULCERS  *URINARY PROBLEMS  *BOWEL PROBLEMS  UNUSUAL RASH Items with * indicate a potential emergency and should be followed up as soon as possible.  Feel free to call the clinic should you have any questions or concerns. The clinic phone number is [phone].  Please show the [facility] at check-in to the Emergency Department and triage nurse.

## 2018-09-02 ENCOUNTER — Other Ambulatory Visit: Payer: Self-pay

## 2018-09-02 ENCOUNTER — Telehealth: Payer: Self-pay

## 2018-09-02 ENCOUNTER — Other Ambulatory Visit: Payer: 59 | Admitting: Primary Care

## 2018-09-02 DIAGNOSIS — R112 Nausea with vomiting, unspecified: Secondary | ICD-10-CM

## 2018-09-02 DIAGNOSIS — R180 Malignant ascites: Secondary | ICD-10-CM

## 2018-09-02 DIAGNOSIS — R1030 Lower abdominal pain, unspecified: Secondary | ICD-10-CM

## 2018-09-02 DIAGNOSIS — C189 Malignant neoplasm of colon, unspecified: Secondary | ICD-10-CM

## 2018-09-02 DIAGNOSIS — C787 Secondary malignant neoplasm of liver and intrahepatic bile duct: Secondary | ICD-10-CM

## 2018-09-02 DIAGNOSIS — Z515 Encounter for palliative care: Secondary | ICD-10-CM

## 2018-09-02 LAB — BODY FLUID CULTURE: Culture: NO GROWTH

## 2018-09-02 MED ORDER — OXYCODONE HCL 10 MG PO TABS: 10.0000 mg | ORAL_TABLET | ORAL | 0 refills | Status: DC | PRN

## 2018-09-02 MED ORDER — FENTANYL 75 MCG/HR TD PT72: 1.0000 | MEDICATED_PATCH | TRANSDERMAL | 0 refills | Status: DC

## 2018-09-02 NOTE — Progress Notes (Signed)
[facility] Telephone: [phone] Fax: [phone]  PATIENT NAME: Scott Gallagher DOB: Mar 19, 1971 MRN: [id number]  PRIMARY CARE PROVIDER:   [name], MD  REFERRING PROVIDER:  [name], MD [address], [place] [address]  RESPONSIBLE PARTY:   Extended Emergency Contact Information Primary Emergency Contact: Wherley,Jennifer Address: [address]          [place], [place] [address] Johnnette Litter of [place] Phone: [phone] Work Phone: [phone] Relation: Spouse  Palliative care is seeing patient on referral from Dr. Moreen Fowler. Met with patient and wife in their home.  ASSESSMENT and RECOMMENDATIONs:   1. Nausea: Continue compazine or phenergan, ondansetron. Wife states none work very well. He is very uncomfortable with nausea and vomiting, weakness, pain. Could try haldol  1-2 mg q 6 hours for nausea if the above does not work.  2. Pain:  I recommend Oxycontin  40 mg q 12 hrs, with oxycodone 5-10 mg q 4 hours prn, in addition to the current 75 mcg patch. His oxycontin can then be increased as needed to decrease need for break through dosing.  Eventually, I recommend transitioning/titrating  to the oxycontin only as basal and stopping the fentanyl over time.                         His pain is intractable, he is taking 75 mcg fentanyl with 120 mg oxycodone daily for breakthrough, without sufficient relief. Best score  is 5/10 and worst is 8/10.  His current oxycodone dose is equivalent to another 75 mcg of fentanyl. Wife is also not sure if the transdermal delivery is effective due to poor skin adhesion.   3. ASCITES:  Recommend to place indwelling paracentesis catheter soon,  as he is continuing to be symptomatic. This was discussed with oncology on the last visit. If he  does transition to hospice, then he will already have the drainage system in place and hospice nurses can manage.  He will likely need regular draining for  comfort and can get home health to manage prior to any future hospice admission.   4. Constipation: Increase senna from 2 tabs daily to two tablets bid. Add miralax 17 gm in water or half dose if full dose is too much,  for soft, daily stools. Exacerbated by disease, poor intake, opioids. Fleets enema if no bm x 3 days.  5 Nutrition/hydration: Continue to Rx nausea and vomiting for po intake. If patient cannot sustain hydration, IV hydration may be needed. Has a port which is currently infusing chemo treatment.  6. Care giver support: Home health referral for medication management, disease process teaching if not yet ready to elect hospice. Wife is caregiver to patient as well as to her mother who has had a CVA and deficits. She is tearful that she needs help and support. She as well as patient would benefit from hospice team symptom management/psychosocial approach once they have elected to stop active treatment of the disease process.   7. Goals of Care:  Introduced hospice as a philosophy of maximizing quality of life and the holistic approach addressing medical, nursing, social and spiritual needs. Answered questions RE: course of disease, decline, and benefit of continued treatments. Reiterated it was a personal choice but encouraged them to research effect and side effects, which is their chief concern.  Palliative care will continue to follow for goals of care, symptom management. Return <1 week.  I spent 130 minutes  providing this consultation,  from 1230 to 1440. More than 50% of the time in this consultation was spent coordinating communication.   HISTORY OF PRESENT ILLNESS:  Dolores Mcgovern is a 48 y.o. year old male with multiple medical problems including colon cancer with liver metastases, OA, DM2, anemia. Palliative Care was asked to help address goals of care.   CODE STATUS: FULL  PPS: 40% HOSPICE ELIGIBILITY/DIAGNOSIS: TBD  PAST MEDICAL HISTORY:  Past Medical History:  Diagnosis  Date  . Anemia   . Arthritis   . Colon cancer ([place]) dx'd 05/2016  . Diabetes mellitus without complication (HCC)    diet controlled  . History of blood transfusion   . Hyperlipidemia   . liver mets dx'd 05/2016  . Sleep apnea    cpap  . Wears glasses     SOCIAL HX:  Social History   Tobacco Use  . Smoking status: Former Smoker    Packs/day: 1.50    Years: 29.00    Pack years: 43.50    Types: Cigarettes    Last attempt to quit: 12/16/2012    Years since quitting: 5.7  . Smokeless tobacco: Never Used  . Tobacco comment: 1-2 ppd from age [age]y to 2014  Substance Use Topics  . Alcohol use: Yes    Alcohol/week: 3.0 standard drinks    Types: 3 Shots of liquor per week    Comment:  some days    ALLERGIES:  Allergies  Allergen Reactions  . Penicillins     Was told as a child he was allergic does not know of type of reaction.   Has patient had a PCN reaction causing immediate rash, facial/tongue/throat swelling, SOB or lightheadedness with hypotension: N Has patient had a PCN reaction causing severe rash involving mucus membranes or skin necrosis: N Has patient had a PCN reaction that required hospitalization: N Has patient had a PCN reaction occurring within the last 10 years: N If all of the above answers are "NO", then may proceed with Cephalospo     PERTINENT MEDICATIONS:  Outpatient Encounter Medications as of 49/17/2020  Medication Sig  . Albuterol Sulfate (PROAIR RESPICLICK) 825 (90 Base) MCG/ACT AEPB Inhale 2 puffs into the lungs every 4 (four) hours as needed (cough and wheezing).  [name] dronabinol (MARINOL) 5 MG capsule Take 1 capsule (5 mg total) by mouth 2 (two) times daily before a meal.  . feeding supplement, ENSURE ENLIVE, (ENSURE ENLIVE) LIQD Take 237 mLs by mouth 2 (two) times daily between meals.  . fentaNYL (DURAGESIC) 75 MCG/HR Place 1 patch onto the skin every 3 (three) days.  [name] loperamide (IMODIUM A-D) 2 MG tablet Take 1-2 tablets (2-4 mg total) by mouth 4  (four) times daily as needed for diarrhea or loose stools.  [name] LORazepam (ATIVAN) 0.5 MG tablet Take 1-2 tablets (0.5-1 mg total) by mouth every 8 (eight) hours as needed for anxiety (nausea).  . Melatonin 3 MG TABS Take 6 tablets by mouth at bedtime as needed (sleep).   . Multiple Vitamin (MULTIVITAMIN WITH MINERALS) TABS tablet Take 1 tablet by mouth daily.  . ondansetron (ZOFRAN ODT) 4 MG disintegrating tablet Take 1 tablet (4 mg total) by mouth every 8 (eight) hours as needed for nausea or vomiting.  . ondansetron (ZOFRAN) 8 MG tablet Take 1 tablet (8 mg total) by mouth 2 (two) times daily as needed for refractory nausea / vomiting. Start on day 3 after chemotherapy.  . Oxycodone HCl 10 MG TABS Take 1-2 tablets (10-20  mg total) by mouth every 4 (four) hours as needed.  . polyethylene glycol (MIRALAX) packet Take 17 g by mouth daily.  . prochlorperazine (COMPAZINE) 10 MG tablet TAKE 1 TABLET BY MOUTH EVERY 6 HOURS AS NEEDED FOR NAUSEA / VOMITING (Patient taking differently: Take 10 mg by mouth every 6 (six) hours as needed for nausea or vomiting. TAKE 1 TABLET BY MOUTH EVERY 6 HOURS AS NEEDED FOR NAUSEA / VOMITING)  . senna-docusate (SENNA S) 8.6-50 MG tablet Take 2 tablets by mouth 2 (two) times daily. (Patient taking differently: Take 2 tablets by mouth daily. )   No facility-administered encounter medications on file as of 09/02/2018.     PHYSICAL EXAM:  VS 98.2- 78-18 134/78 oxygen 97% RA General: NAD, frail appearing, Cardiovascular: regular rate and rhythm, S1S2 Pulmonary: clear all fields, no cough Abdomen: enlarged,  nontender, + bowel sounds, Endorses moderate nausea and vomiting, endorses bm today but general constipation Extremities: no edema, no joint deformities Skin: no rashes, wounds Neurological: Weakness , lethargy, wife endorses confusion at times.  Cyndia Skeeters DNP, AGPCNP-BC

## 2018-09-02 NOTE — Telephone Encounter (Signed)
Phone call placed to patient's wife to provide update that NP will be [place] today and closer to 12noon.

## 2018-09-03 ENCOUNTER — Inpatient Hospital Stay: Payer: 59

## 2018-09-03 ENCOUNTER — Ambulatory Visit: Payer: 59

## 2018-09-03 VITALS — BP 125/93 | HR 105 | Temp 98.5°F | Resp 18

## 2018-09-03 MED ORDER — SODIUM CHLORIDE 0.9% FLUSH
10.0000 mL | INTRAVENOUS | Status: DC | PRN
  Administered 2018-09-03: 10 mL via INTRAVENOUS
  Filled 2018-09-03: qty 10

## 2018-09-03 MED ORDER — PALONOSETRON HCL INJECTION 0.25 MG/5ML: 0.2500 mg | Freq: Once | INTRAVENOUS | Status: DC

## 2018-09-03 MED ORDER — HEPARIN SOD (PORK) LOCK FLUSH 100 UNIT/ML IV SOLN: 500.0000 [IU] | Freq: Once | INTRAVENOUS | Status: AC | PRN

## 2018-09-03 MED ORDER — PALONOSETRON HCL INJECTION 0.25 MG/5ML
INTRAVENOUS | Status: AC
  Filled 2018-09-03: qty 5

## 2018-09-03 MED ORDER — SODIUM CHLORIDE 0.9% FLUSH: 10.0000 mL | INTRAVENOUS | Status: AC | PRN

## 2018-09-03 MED ORDER — HEPARIN SOD (PORK) LOCK FLUSH 100 UNIT/ML IV SOLN
500.0000 [IU] | Freq: Once | INTRAVENOUS | Status: AC | PRN
  Administered 2018-09-03: 500 [IU] via INTRAVENOUS
  Filled 2018-09-03: qty 5

## 2018-09-03 NOTE — Addendum Note (Signed)
Addended by: Kasandra Knudsen A on: 09/03/2018 12:36 PM   Modules accepted: Orders

## 2018-09-04 ENCOUNTER — Emergency Department (HOSPITAL_COMMUNITY): Payer: 59

## 2018-09-04 ENCOUNTER — Inpatient Hospital Stay (HOSPITAL_COMMUNITY)
Admission: EM | Admit: 2018-09-04 | Discharge: 2018-09-08 | DRG: 435 | Disposition: A | Discharge status: Hospice | Payer: 59 | Attending: Internal Medicine | Admitting: Internal Medicine

## 2018-09-04 ENCOUNTER — Other Ambulatory Visit: Payer: Self-pay

## 2018-09-04 ENCOUNTER — Encounter (HOSPITAL_COMMUNITY): Payer: Self-pay | Admitting: Emergency Medicine

## 2018-09-04 DIAGNOSIS — I700 Atherosclerosis of aorta: Secondary | ICD-10-CM | POA: Diagnosis present

## 2018-09-04 DIAGNOSIS — J189 Pneumonia, unspecified organism: Secondary | ICD-10-CM

## 2018-09-04 DIAGNOSIS — E785 Hyperlipidemia, unspecified: Secondary | ICD-10-CM | POA: Diagnosis present

## 2018-09-04 DIAGNOSIS — Z79899 Other long term (current) drug therapy: Secondary | ICD-10-CM

## 2018-09-04 DIAGNOSIS — J90 Pleural effusion, not elsewhere classified: Secondary | ICD-10-CM | POA: Diagnosis present

## 2018-09-04 DIAGNOSIS — C786 Secondary malignant neoplasm of retroperitoneum and peritoneum: Secondary | ICD-10-CM | POA: Diagnosis present

## 2018-09-04 DIAGNOSIS — R5381 Other malaise: Secondary | ICD-10-CM | POA: Diagnosis not present

## 2018-09-04 DIAGNOSIS — G893 Neoplasm related pain (acute) (chronic): Secondary | ICD-10-CM

## 2018-09-04 DIAGNOSIS — R188 Other ascites: Secondary | ICD-10-CM | POA: Diagnosis present

## 2018-09-04 DIAGNOSIS — C189 Malignant neoplasm of colon, unspecified: Secondary | ICD-10-CM | POA: Diagnosis present

## 2018-09-04 DIAGNOSIS — Z833 Family history of diabetes mellitus: Secondary | ICD-10-CM

## 2018-09-04 DIAGNOSIS — E869 Volume depletion, unspecified: Secondary | ICD-10-CM

## 2018-09-04 DIAGNOSIS — Z7189 Other specified counseling: Secondary | ICD-10-CM | POA: Diagnosis not present

## 2018-09-04 DIAGNOSIS — R112 Nausea with vomiting, unspecified: Secondary | ICD-10-CM

## 2018-09-04 DIAGNOSIS — J9811 Atelectasis: Secondary | ICD-10-CM | POA: Diagnosis present

## 2018-09-04 DIAGNOSIS — Z800 Family history of malignant neoplasm of digestive organs: Secondary | ICD-10-CM | POA: Diagnosis not present

## 2018-09-04 DIAGNOSIS — R0902 Hypoxemia: Secondary | ICD-10-CM | POA: Diagnosis present

## 2018-09-04 DIAGNOSIS — T451X5A Adverse effect of antineoplastic and immunosuppressive drugs, initial encounter: Secondary | ICD-10-CM | POA: Diagnosis present

## 2018-09-04 DIAGNOSIS — E119 Type 2 diabetes mellitus without complications: Secondary | ICD-10-CM

## 2018-09-04 DIAGNOSIS — J181 Lobar pneumonia, unspecified organism: Secondary | ICD-10-CM

## 2018-09-04 DIAGNOSIS — E876 Hypokalemia: Secondary | ICD-10-CM | POA: Diagnosis not present

## 2018-09-04 DIAGNOSIS — R180 Malignant ascites: Secondary | ICD-10-CM | POA: Diagnosis not present

## 2018-09-04 DIAGNOSIS — Z87891 Personal history of nicotine dependence: Secondary | ICD-10-CM

## 2018-09-04 DIAGNOSIS — Z9221 Personal history of antineoplastic chemotherapy: Secondary | ICD-10-CM

## 2018-09-04 DIAGNOSIS — R066 Hiccough: Secondary | ICD-10-CM | POA: Diagnosis not present

## 2018-09-04 DIAGNOSIS — Z515 Encounter for palliative care: Secondary | ICD-10-CM | POA: Diagnosis not present

## 2018-09-04 DIAGNOSIS — C182 Malignant neoplasm of ascending colon: Secondary | ICD-10-CM | POA: Diagnosis not present

## 2018-09-04 DIAGNOSIS — G4733 Obstructive sleep apnea (adult) (pediatric): Secondary | ICD-10-CM | POA: Diagnosis present

## 2018-09-04 DIAGNOSIS — Z66 Do not resuscitate: Secondary | ICD-10-CM | POA: Diagnosis present

## 2018-09-04 DIAGNOSIS — C787 Secondary malignant neoplasm of liver and intrahepatic bile duct: Secondary | ICD-10-CM | POA: Diagnosis not present

## 2018-09-04 DIAGNOSIS — Z79891 Long term (current) use of opiate analgesic: Secondary | ICD-10-CM

## 2018-09-04 DIAGNOSIS — N200 Calculus of kidney: Secondary | ICD-10-CM | POA: Diagnosis present

## 2018-09-04 DIAGNOSIS — Z880 Allergy status to penicillin: Secondary | ICD-10-CM

## 2018-09-04 DIAGNOSIS — C19 Malignant neoplasm of rectosigmoid junction: Secondary | ICD-10-CM

## 2018-09-04 LAB — URINALYSIS, ROUTINE W REFLEX MICROSCOPIC
BILIRUBIN URINE: NEGATIVE
Glucose, UA: NEGATIVE mg/dL
Hgb urine dipstick: NEGATIVE
Ketones, ur: NEGATIVE mg/dL
Leukocytes, UA: NEGATIVE
Nitrite: NEGATIVE
Protein, ur: NEGATIVE mg/dL
SPECIFIC GRAVITY, URINE: 1.024 (ref 1.005–1.030)
pH: 5 (ref 5.0–8.0)

## 2018-09-04 LAB — COMPREHENSIVE METABOLIC PANEL
ALT: 15 U/L (ref 0–44)
AST: 21 U/L (ref 15–41)
Albumin: 3.2 g/dL — ABNORMAL LOW (ref 3.5–5.0)
Alkaline Phosphatase: 126 U/L (ref 38–126)
Anion gap: 13 (ref 5–15)
BUN: 20 mg/dL (ref 6–20)
CO2: 30 mmol/L (ref 22–32)
Calcium: 8.9 mg/dL (ref 8.9–10.3)
Chloride: 92 mmol/L — ABNORMAL LOW (ref 98–111)
Creatinine, Ser: 0.83 mg/dL (ref 0.61–1.24)
GFR calc Af Amer: >60 mL/min (ref >60)
GFR calc non Af Amer: >60 mL/min (ref >60)
Glucose, Bld: 142 mg/dL — ABNORMAL HIGH (ref 70–99)
Potassium: 3.4 mmol/L — ABNORMAL LOW (ref 3.5–5.1)
Sodium: 135 mmol/L (ref 135–145)
Total Bilirubin: 0.9 mg/dL (ref 0.3–1.2)
Total Protein: 6.6 g/dL (ref 6.5–8.1)

## 2018-09-04 LAB — CBC WITH DIFFERENTIAL/PLATELET
Abs Immature Granulocytes: 0.07 10*3/uL (ref 0.00–0.07)
BASOS PCT: 0 %
Basophils Absolute: 0 10*3/uL (ref 0.0–0.1)
Eosinophils Absolute: 0.2 10*3/uL (ref 0.0–0.5)
Eosinophils Relative: 2 %
HCT: 42.1 % (ref 39.0–52.0)
Hemoglobin: 12.6 g/dL — ABNORMAL LOW (ref 13.0–17.0)
Immature Granulocytes: 1 %
Lymphocytes Relative: 5 %
Lymphs Abs: 0.6 10*3/uL — ABNORMAL LOW (ref 0.7–4.0)
MCH: 25.1 pg — ABNORMAL LOW (ref 26.0–34.0)
MCHC: 29.9 g/dL — ABNORMAL LOW (ref 30.0–36.0)
MCV: 84 fL (ref 80.0–100.0)
Monocytes Absolute: 0.3 10*3/uL (ref 0.1–1.0)
Monocytes Relative: 3 %
Neutro Abs: 11.4 10*3/uL — ABNORMAL HIGH (ref 1.7–7.7)
Neutrophils Relative %: 89 %
PLATELETS: 482 10*3/uL — AB (ref 150–400)
RBC: 5.01 MIL/uL (ref 4.22–5.81)
RDW: 17.4 % — ABNORMAL HIGH (ref 11.5–15.5)
WBC: 12.6 10*3/uL — ABNORMAL HIGH (ref 4.0–10.5)
nRBC: 0 % (ref 0.0–0.2)

## 2018-09-04 LAB — PROTIME-INR
INR: 1.07
Prothrombin Time: 13.8 seconds (ref 11.4–15.2)

## 2018-09-04 LAB — GLUCOSE, CAPILLARY
GLUCOSE-CAPILLARY: 129 mg/dL — AB (ref 70–99)
Glucose-Capillary: 145 mg/dL — ABNORMAL HIGH (ref 70–99)

## 2018-09-04 LAB — I-STAT CG4 LACTIC ACID, ED
Lactic Acid, Venous: 1.96 mmol/L — ABNORMAL HIGH (ref 0.5–1.9)
Lactic Acid, Venous: 1.29 mmol/L (ref 0.5–1.9)

## 2018-09-04 MED ORDER — ENSURE ENLIVE PO LIQD: 237.0000 mL | Freq: Two times a day (BID) | ORAL | Status: DC

## 2018-09-04 MED ORDER — POLYETHYLENE GLYCOL 3350 17 G PO PACK: 17.0000 g | PACK | Freq: Every day | ORAL | Status: DC | PRN

## 2018-09-04 MED ORDER — PROCHLORPERAZINE MALEATE 10 MG PO TABS: 10.0000 mg | ORAL_TABLET | Freq: Four times a day (QID) | ORAL | Status: DC | PRN

## 2018-09-04 MED ORDER — ADULT MULTIVITAMIN W/MINERALS CH
1.0000 | ORAL_TABLET | Freq: Every day | ORAL | Status: DC
  Administered 2018-09-05 – 2018-09-06 (×3): 1 via ORAL
  Filled 2018-09-04 (×2): qty 1

## 2018-09-04 MED ORDER — SODIUM CHLORIDE 0.9 % IV SOLN
25.0000 mg | Freq: Four times a day (QID) | INTRAVENOUS | Status: DC | PRN
  Filled 2018-09-04: qty 1

## 2018-09-04 MED ORDER — SENNOSIDES-DOCUSATE SODIUM 8.6-50 MG PO TABS
2.0000 | ORAL_TABLET | Freq: Two times a day (BID) | ORAL | Status: DC
  Administered 2018-09-05 – 2018-09-08 (×7): 2 via ORAL
  Filled 2018-09-04 (×6): qty 2

## 2018-09-04 MED ORDER — FENTANYL 75 MCG/HR TD PT72
1.0000 | MEDICATED_PATCH | TRANSDERMAL | Status: DC
  Administered 2018-09-05 – 2018-09-08 (×2): 1 via TRANSDERMAL
  Filled 2018-09-04 (×2): qty 1

## 2018-09-04 MED ORDER — ALBUTEROL SULFATE (2.5 MG/3ML) 0.083% IN NEBU: 2.5000 mg | INHALATION_SOLUTION | RESPIRATORY_TRACT | Status: DC | PRN

## 2018-09-04 MED ORDER — DRONABINOL 5 MG PO CAPS
5.0000 mg | ORAL_CAPSULE | Freq: Two times a day (BID) | ORAL | Status: DC
  Administered 2018-09-04 – 2018-09-06 (×4): 5 mg via ORAL
  Filled 2018-09-04 (×4): qty 1

## 2018-09-04 MED ORDER — PIPERACILLIN-TAZOBACTAM 3.375 G IVPB
3.3750 g | Freq: Once | INTRAVENOUS | Status: AC
  Administered 2018-09-04: 3.375 g via INTRAVENOUS
  Filled 2018-09-04: qty 50

## 2018-09-04 MED ORDER — SODIUM CHLORIDE 0.9 % IV SOLN
500.0000 mg | INTRAVENOUS | Status: DC
  Administered 2018-09-04 – 2018-09-06 (×3): 500 mg via INTRAVENOUS
  Filled 2018-09-04 (×3): qty 500

## 2018-09-04 MED ORDER — SODIUM CHLORIDE 0.9% FLUSH: 3.0000 mL | Freq: Once | INTRAVENOUS | Status: DC

## 2018-09-04 MED ORDER — SODIUM CHLORIDE 0.9 % IV SOLN
INTRAVENOUS | Status: DC | PRN
  Administered 2018-09-04: 1000 mL via INTRAVENOUS

## 2018-09-04 MED ORDER — POTASSIUM CHLORIDE IN NACL 20-0.45 MEQ/L-% IV SOLN
INTRAVENOUS | Status: DC
  Administered 2018-09-04 – 2018-09-07 (×6): via INTRAVENOUS
  Filled 2018-09-04 (×9): qty 1000

## 2018-09-04 MED ORDER — ACETAMINOPHEN 325 MG PO TABS
650.0000 mg | ORAL_TABLET | Freq: Four times a day (QID) | ORAL | Status: DC | PRN
  Administered 2018-09-08: 650 mg via ORAL
  Filled 2018-09-04: qty 2

## 2018-09-04 MED ORDER — VANCOMYCIN HCL IN DEXTROSE 1-5 GM/200ML-% IV SOLN
1000.0000 mg | Freq: Once | INTRAVENOUS | Status: AC
  Administered 2018-09-04: 1000 mg via INTRAVENOUS
  Filled 2018-09-04: qty 200

## 2018-09-04 MED ORDER — INSULIN ASPART 100 UNIT/ML ~~LOC~~ SOLN
0.0000 [IU] | Freq: Three times a day (TID) | SUBCUTANEOUS | Status: DC
  Administered 2018-09-04: 1 [IU] via SUBCUTANEOUS
  Administered 2018-09-05 (×2): 2 [IU] via SUBCUTANEOUS
  Administered 2018-09-05: 1 [IU] via SUBCUTANEOUS

## 2018-09-04 MED ORDER — LORAZEPAM 0.5 MG PO TABS
0.5000 mg | ORAL_TABLET | Freq: Three times a day (TID) | ORAL | Status: DC | PRN
  Administered 2018-09-04 – 2018-09-06 (×3): 1 mg via ORAL
  Filled 2018-09-04 (×3): qty 2

## 2018-09-04 MED ORDER — ONDANSETRON HCL 4 MG/2ML IJ SOLN
4.0000 mg | Freq: Four times a day (QID) | INTRAMUSCULAR | Status: DC | PRN
  Administered 2018-09-04 – 2018-09-08 (×9): 4 mg via INTRAVENOUS
  Filled 2018-09-04 (×12): qty 2

## 2018-09-04 MED ORDER — SODIUM CHLORIDE 0.9 % IV BOLUS
1000.0000 mL | Freq: Once | INTRAVENOUS | Status: AC
  Administered 2018-09-04: 1000 mL via INTRAVENOUS

## 2018-09-04 MED ORDER — HYDROMORPHONE HCL 1 MG/ML IJ SOLN
1.0000 mg | Freq: Once | INTRAMUSCULAR | Status: AC
  Administered 2018-09-04: 1 mg via INTRAVENOUS
  Filled 2018-09-04: qty 1

## 2018-09-04 MED ORDER — PROMETHAZINE HCL 25 MG/ML IJ SOLN
12.5000 mg | Freq: Once | INTRAMUSCULAR | Status: AC
  Administered 2018-09-04: 12.5 mg via INTRAVENOUS
  Filled 2018-09-04: qty 1

## 2018-09-04 MED ORDER — PROMETHAZINE HCL 25 MG/ML IJ SOLN
25.0000 mg | INTRAMUSCULAR | Status: DC | PRN
  Administered 2018-09-04 – 2018-09-06 (×7): 25 mg via INTRAVENOUS
  Filled 2018-09-04 (×7): qty 1

## 2018-09-04 MED ORDER — LOPERAMIDE HCL 2 MG PO CAPS: 2.0000 mg | ORAL_CAPSULE | Freq: Four times a day (QID) | ORAL | Status: DC | PRN

## 2018-09-04 MED ORDER — OXYCODONE HCL 5 MG PO TABS
10.0000 mg | ORAL_TABLET | ORAL | Status: DC | PRN
  Administered 2018-09-04 – 2018-09-05 (×2): 10 mg via ORAL
  Filled 2018-09-04 (×2): qty 2

## 2018-09-04 MED ORDER — SODIUM CHLORIDE 0.9 % IV SOLN
25.0000 mg | Freq: Four times a day (QID) | INTRAVENOUS | Status: DC | PRN
  Administered 2018-09-04 – 2018-09-05 (×3): 25 mg via INTRAVENOUS
  Filled 2018-09-04 (×3): qty 1

## 2018-09-04 MED ORDER — SODIUM CHLORIDE 0.9 % IV SOLN
1.0000 g | INTRAVENOUS | Status: DC
  Administered 2018-09-04: 1 g via INTRAVENOUS
  Filled 2018-09-04: qty 1

## 2018-09-04 MED ORDER — ACETAMINOPHEN 650 MG RE SUPP: 650.0000 mg | Freq: Four times a day (QID) | RECTAL | Status: DC | PRN

## 2018-09-04 MED ORDER — BOOST / RESOURCE BREEZE PO LIQD CUSTOM
1.0000 | Freq: Three times a day (TID) | ORAL | Status: DC
  Administered 2018-09-04 – 2018-09-06 (×4): 1 via ORAL

## 2018-09-04 MED ORDER — ONDANSETRON HCL 4 MG/2ML IJ SOLN
4.0000 mg | Freq: Once | INTRAMUSCULAR | Status: AC
  Administered 2018-09-04: 4 mg via INTRAVENOUS
  Filled 2018-09-04: qty 2

## 2018-09-04 MED ORDER — ENOXAPARIN SODIUM 40 MG/0.4ML ~~LOC~~ SOLN
40.0000 mg | SUBCUTANEOUS | Status: DC
  Administered 2018-09-04 – 2018-09-05 (×2): 40 mg via SUBCUTANEOUS
  Filled 2018-09-04 (×2): qty 0.4

## 2018-09-04 NOTE — H&P (Addendum)
[facility] History and Physical  [place] Luppino AQT:[id number] DOB: [birth year] DOA: 09/04/2018  Referring physician: ED  PCP: [name], MD   Chief Complaint: Nausea vomiting  HPI: Scott Gallagher is a 48 y.o. 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  He does have history of metastatic colon cancer to liver and is on chemotherapy by oncology as outpatient.  He states that his nausea and vomiting is worsening last 12 hours or so and there is no obvious aggravating or relieving factors.  No hematemesis or melena.  Denies any abdominal pain.  He has been having constant nausea and hiccups.  He did have 3-4 episodes of vomiting yesterday followed by fever chills today.  Patient has abdominal discomfort largely from ascites.  Patient states that he has had ascites tapped at least 2 times last one was last week with 5 L fluid removal.  There was some discussion regarding peritoneal catheter for long-term relief of ascites.  Patient states that he has not been able to lie down largely because of ascites at home.  He however denies any cough, runny nose, fever or chills.  Patient denies any urinary urgency, frequency or dysuria.  He has impaired appetite.  Denies any dizziness, syncope headache.  Penicillin is listed in the computer as allergy but he has received penicillin in the past without any problem.  ED Course: In the ED, patient was also noted to be mildly hypoxic.  Chest x-ray done in the ED showed new infiltrate on the left lung.  Had mild leukocytosis as well.  Patient was then considered for admission to the hospital for persistent nausea, vomiting with possible left sided pneumonia.  Review of Systems:  All systems were  reviewed and were negative unless otherwise mentioned in the HPI  Past Medical History:  Diagnosis Date  . Anemia   . Arthritis   . Colon cancer ([place]) dx'd 05/2016  . Diabetes mellitus without complication (HCC)    diet controlled  . History of blood transfusion   . Hyperlipidemia   . liver mets dx'd 05/2016  . Sleep apnea    cpap  . Wears glasses    Past Surgical History:  Procedure Laterality Date  . COLON SURGERY    . IR GENERIC HISTORICAL  09/24/2016   IR CV LINE INJECTION 09/24/2016 [facility]  . IR GENERIC HISTORICAL  09/24/2016   IR US GUIDE VASC ACCESS RIGHT 09/24/2016 [facility]  . IR GENERIC HISTORICAL  09/24/2016   IR FLUORO GUIDE CV LINE RIGHT 09/24/2016 [facility]  . IR GENERIC HISTORICAL  09/30/2016   IR FLUORO GUIDE PORT INSERTION RIGHT 09/30/2016 Markus Daft, MD [facility]  . IR GENERIC HISTORICAL  09/30/2016   IR US GUIDE VASC ACCESS RIGHT 09/30/2016 Markus Daft, MD [facility]  . IR GENERIC HISTORICAL  09/30/2016   IR REMOVAL TUN ACCESS W/ PORT W/O FL MOD SED 09/30/2016 Markus Daft, MD [facility]  . LAPAROSCOPIC RIGHT HEMI COLECTOMY Right 06/17/2016   Procedure: LAPAROSCOPIC ASSISTED  RIGHT HEMI COLECTOMY;  Surgeon: [name], MD;  Location: [facility];  Service: General;  Laterality: Right;  . PORTACATH PLACEMENT Left 07/13/2016   Procedure: INSERTION PORT-A-CATH left subclavian;  Surgeon: [name], MD;  Location: [facility];  Service: General;  Laterality: Left;  . SHOULDER  ACROMIOPLASTY Right 12/20/2014   Procedure: SHOULDER ACROMIOPLASTY;  Surgeon: Melrose Nakayama, MD;  Location: [place] [facility];  Service: Orthopedics;  Laterality: Right;  . SHOULDER ARTHROSCOPY Right 12/20/2014   Procedure: RIGHT ARTHROSCOPY SHOULDER WITH DEBRIDEMENT;  Surgeon: Melrose Nakayama, MD;  Location: [place];  Service: Orthopedics;  Laterality: Right;  . TESTICLE SURGERY     as teen  . TONSILLECTOMY    . WISDOM TOOTH EXTRACTION      Social History:  reports  that he quit smoking about 5 years ago. His smoking use included cigarettes. He has a 43.50 pack-year smoking history. He has never used smokeless tobacco. He reports current alcohol use of about 3.0 standard drinks of alcohol per week. He reports current drug use. Drug: Marijuana.  Allergies  Allergen Reactions  . Penicillins     Was told as a child he was allergic does not know of type of reaction.   Has patient had a PCN reaction causing immediate rash, facial/tongue/throat swelling, SOB or lightheadedness with hypotension: N Has patient had a PCN reaction causing severe rash involving mucus membranes or skin necrosis: N Has patient had a PCN reaction that required hospitalization: N Has patient had a PCN reaction occurring within the last 10 years: N If all of the above answers are "NO", then may proceed with Cephalospo    Family History  Problem Relation Age of Onset  . Diabetes Other   . Hyperlipidemia Other   . Hypertension Other   . Breast cancer Maternal Aunt 70  . Prostate cancer Maternal Uncle 75  . Lung cancer Paternal Uncle 76  . Stroke Maternal Grandfather 72  . Cancer Paternal Grandmother 39       dx cancer of pancreas and colon, unknown if separate primaries  . Heart Problems Paternal Grandfather        d. 68  . Prostate cancer Maternal Uncle 37  . Breast cancer Maternal Aunt 75  . Breast cancer Maternal Aunt 68  . Cervical cancer Maternal Aunt 68  . Pancreatic cancer Maternal Aunt 54       d. 58y; heavy smoker  . Colon cancer Paternal Uncle 3       s/p partial colectomy; dx. second colon cancer at age 38-64     Prior to Admission medications   Medication Sig Start Date End Date Taking? Authorizing Provider  dronabinol (MARINOL) 5 MG capsule Take 1 capsule (5 mg total) by mouth 2 (two) times daily before a meal. 07/21/18  Yes Kale, Cloria Spring, MD  feeding supplement, ENSURE ENLIVE, (ENSURE ENLIVE) LIQD Take 237 mLs by mouth 2 (two) times daily between  meals. 08/20/18  Yes Florencia Reasons, MD  LORazepam (ATIVAN) 0.5 MG tablet Take 1-2 tablets (0.5-1 mg total) by mouth every 8 (eight) hours as needed for anxiety (nausea). 08/04/18  Yes Brunetta Genera, MD  Multiple Vitamin (MULTIVITAMIN WITH MINERALS) TABS tablet Take 1 tablet by mouth daily. 08/21/18  Yes Florencia Reasons, MD  ondansetron (ZOFRAN) 8 MG tablet Take 1 tablet (8 mg total) by mouth 2 (two) times daily as needed for refractory nausea / vomiting. Start on day 3 after chemotherapy. 06/22/18  Yes Brunetta Genera, MD  Oxycodone HCl 10 MG TABS Take 1-2 tablets (10-20 mg total) by mouth every 4 (four) hours as needed. 09/02/18  Yes Brunetta Genera, MD  prochlorperazine (COMPAZINE) 10 MG tablet TAKE 1 TABLET BY MOUTH EVERY 6 HOURS AS NEEDED FOR NAUSEA / VOMITING Patient taking differently:  Take 10 mg by mouth every 6 (six) hours as needed for nausea or vomiting. TAKE 1 TABLET BY MOUTH EVERY 6 HOURS AS NEEDED FOR NAUSEA / VOMITING 08/04/18  Yes Brunetta Genera, MD  promethazine (PHENERGAN) 25 MG tablet Take 25 mg by mouth every 6 (six) hours as needed for nausea or vomiting.  08/29/18  Yes [provider]  senna-docusate (SENNA S) 8.6-50 MG tablet Take 2 tablets by mouth 2 (two) times daily. 08/04/18  Yes Brunetta Genera, MD  Albuterol Sulfate (PROAIR RESPICLICK) 025 (90 Base) MCG/ACT AEPB Inhale 2 puffs into the lungs every 4 (four) hours as needed (cough and wheezing). 08/24/18   Margarita Mail, PA-C  fentaNYL (DURAGESIC) 75 MCG/HR Place 1 patch onto the skin every 3 (three) days. 09/02/18   Brunetta Genera, MD  loperamide (IMODIUM A-D) 2 MG tablet Take 1-2 tablets (2-4 mg total) by mouth 4 (four) times daily as needed for diarrhea or loose stools. 06/22/18   Brunetta Genera, MD  ondansetron (ZOFRAN ODT) 4 MG disintegrating tablet Take 1 tablet (4 mg total) by mouth every 8 (eight) hours as needed for nausea or vomiting. Patient not taking: Reported on 09/04/2018 05/08/18    Montine Circle, PA-C  polyethylene glycol [facility]) packet Take 17 g by mouth daily. Patient taking differently: Take 17 g by mouth daily as needed for mild constipation.  08/20/18   Florencia Reasons, MD    Physical Exam: Vitals:   09/04/18 1100 09/04/18 1130 09/04/18 1200 09/04/18 1230  BP: (!) 124/92 (!) 126/100 (!) 121/103 (!) 134/95  Pulse: (!) 109 (!) 102 (!) 104 100  Resp: 18 18 16 16   Temp:      TempSrc:      SpO2: 92% 92% (!) 89% 92%  Weight:      Height:       Wt Readings from Last 3 Encounters:  09/04/18 96.6 kg  09/01/18 97 kg  08/29/18 99.8 kg   Body mass index is 27.35 kg/m.  General:  Average built, in mild distress due to intermittent hiccups , on nasal cannula.  Appears older than the stated age HENT: Normocephalic, pupils equally reacting to light and accommodation.  No scleral pallor or icterus noted. Oral mucosa is dry Chest:  Clear breath sounds.  Diminished breath sounds bilaterally. No crackles or wheezes.  Chest wall Port-A-Cath noted without any induration, erythema  CVS: S1 &S2 heard. No murmur.  Regular rate and rhythm. Abdomen: Soft, nontender, distended over the bilateral flanks, bowel sounds are heard.  Extremities: No cyanosis, clubbing or edema.  Peripheral pulses are palpable. Psych: Alert, awake and oriented, CNS:  No cranial nerve deficits.  Power equal in all extremities.   No cerebellar signs.   Skin: Warm and dry.  Decreased skin turgor, no rashes noted.  Labs on Admission:  Basic Metabolic Panel: Recent Labs  Lab 08/29/18 1223 08/29/18 1244 09/01/18 0910 09/04/18 1105  NA 140 138 137 135  K 4.2 4.1 4.2 3.4*  CL 102 102 96* 92*  CO2 26  --  30 30  GLUCOSE 109* 105* 126* 142*  BUN 14 11 12 20   CREATININE 0.80 0.70 0.81 0.83  CALCIUM 8.7*  --  8.7* 8.9   Liver Function Tests: Recent Labs  Lab 08/29/18 1223 09/01/18 0910 09/04/18 1105  AST 21 21 21   ALT 10 7 15   ALKPHOS 128* 206* 126  BILITOT 1.5* 1.0 0.9  PROT 6.5 6.2* 6.6    ALBUMIN 3.2* 2.8* 3.2*  Recent Labs  Lab 08/29/18 1223  LIPASE 37   No results for input(s): AMMONIA in the last 168 hours. CBC: Recent Labs  Lab 08/29/18 1223 08/29/18 1244 09/01/18 0910 09/04/18 1105  WBC 13.2*  --  12.7* 12.6*  NEUTROABS 10.4*  --  10.2* 11.4*  HGB 12.6* 13.9 11.4* 12.6*  HCT 43.0 41.0 37.8* 42.1  MCV 84.6  --  82.9 84.0  PLT 428*  --  346 482*   Cardiac Enzymes: No results for input(s): CKTOTAL, CKMB, CKMBINDEX, TROPONINI in the last 168 hours.  BNP (last 3 results) No results for input(s): BNP in the last 8760 hours.  ProBNP (last 3 results) No results for input(s): PROBNP in the last 8760 hours.  CBG: No results for input(s): GLUCAP in the last 168 hours.   Radiological Exams on Admission: Dg Chest 2 View  Result Date: 09/04/2018 CLINICAL DATA:  Colon carcinoma.  Vomiting. EXAM: CHEST - 2 VIEW COMPARISON:  August 23, 2018 FINDINGS: Port-A-Cath tip is in the superior vena cava. No pneumothorax. There is stable elevation of the left hemidiaphragm. There is atelectasis in the left lower lobe with suspected degree of superimposed consolidation. Right lung is clear. Heart size and pulmonary vascularity are normal. No adenopathy. No bone lesions. IMPRESSION: Atelectasis left lower lobe with suspected early pneumonia superimposed in this area. Lungs elsewhere clear. Cardiac silhouette within normal limits. No adenopathy. Stable elevation left hemidiaphragm. Port-A-Cath tip in superior vena cava. Electronically Signed   By: Lowella Grip III M.D.   On: 09/04/2018 10:53    EKG: No new EKG available for review  Assessment/Plan Principal Problem:   Pneumonia Active Problems:   Diabetes mellitus type 2, diet-controlled (HCC)   OSA (obstructive sleep apnea)   Hyperlipidemia   Metastatic colon cancer to liver (HCC)   Nausea & vomiting   Physical deconditioning   Volume depletion   Hypokalemia   Mild cough, hypoxia with left-sided infiltrate  possibly representing pneumonia.  Blood cultures have been sent.  Continue on oxygen, nebulizers and antibiotics with Rocephin and Zithromax.  Will closely monitor.  Nausea, vomiting inability to keep anything down.  Likely secondary to chemotherapy.  Will provide supportive care.  Liquid diet today.  Continue antiemetics.  Volume depletion.  Likely secondary to nausea vomiting.  We will continue with IV fluid hydration.  Mild hypokalemia.  Will replace with IV fluids.  Check BMP in a.m.  Check magnesium levels in a.m. as well.  History of obstructive sleep apnea -not on oxygen or CPAP at home.  Metastatic colon cancer to liver and peritoneal bleed with recurrent ascites.  Undergoing chemotherapy as per oncology.  Patient's family tells me that there was some discussion regarding peritoneal catheter placement.  Could touch base with patient's oncologist regarding this prior to discharge.  Diet controlled diabetes mellitus type 2.  We will put the patient on diabetic diet if tolerated.  Sliding scale insulin.  Weakness, deconditioning secondary to cancer, chemotherapy volume depletion.  Will get physical therapy to evaluate.  Ambulate with fall precautions   Consultant: None  Code Status: Code  DVT Prophylaxis: Lovenox  Antibiotics: None  Family Communication:  Patients' condition and plan of care including tests being ordered have been discussed with the patient and the patient's family at bedside who indicate understanding and agree with the plan.  Disposition Plan: Home  Severity of Illness: The appropriate patient status for this patient is INPATIENT. Inpatient status is judged to be reasonable and necessary in order to  provide the required intensity of service to ensure the patient's safety. The patient's presenting symptoms, physical exam findings, and initial radiographic and laboratory data in the context of their chronic comorbidities is felt to place them at high risk for  further clinical deterioration. Furthermore, it is not anticipated that the patient will be medically stable for discharge from the hospital within 2 midnights of admission.  I certify that at the point of admission it is my clinical judgment that the patient will require inpatient hospital care spanning beyond 2 midnights from the point of admission due to high intensity of service, high risk for further deterioration and high frequency of surveillance required.  Signed, Flora Lipps, MD [facility] 09/04/2018

## 2018-09-04 NOTE — ED Provider Notes (Signed)
[facility] DEPT Provider Note   CSN: [id number] Arrival date & time: 09/04/18  1022     History   Chief Complaint Chief Complaint  Patient presents with  . Emesis    HPI Scott Gallagher is a 48 y.o. male.  Patient is a 48 year old male with past medical history of metastatic colon cancer, diabetes.  He is currently undergoing chemotherapy, completing his most recent treatment yesterday.  Since finishing this, he has had persistent vomiting and has been unable to keep anything down.  He is also complaining of abdominal discomfort.  He denies fevers or chills.  The history is provided by the patient.  Emesis  Severity:  Moderate Duration:  12 hours Timing:  Constant Quality:  Stomach contents and bilious material Progression:  Worsening Chronicity:  Recurrent Recent urination:  Normal Relieved by:  Nothing Worsened by:  Nothing Ineffective treatments:  Antiemetics Associated symptoms: abdominal pain   Associated symptoms: no cough, no diarrhea and no fever     Past Medical History:  Diagnosis Date  . Anemia   . Arthritis   . Colon cancer ([place]) dx'd 05/2016  . Diabetes mellitus without complication (HCC)    diet controlled  . History of blood transfusion   . Hyperlipidemia   . liver mets dx'd 05/2016  . Sleep apnea    cpap  . Wears glasses     Patient Active Problem List   Diagnosis Date Noted  . Ascites   . Pneumonia of right lower lobe due to infectious organism ([place])   . FTT (failure to thrive) in adult   . Aspiration pneumonia ([place]) 08/18/2018  . Sepsis ([place]) 08/18/2018  . Abdominal pain 08/18/2018  . Nausea & vomiting 08/18/2018  . Chronic anemia 08/18/2018  . Physical deconditioning 08/18/2018  . Anxiety 08/18/2018  . Counseling regarding advanced care planning and goals of care 05/27/2017  . 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 Genetic testing 10/30/2016  . Port catheter in place 09/25/2016  . Family  history of colon cancer 07/24/2016  . Family history of prostate cancer 07/24/2016  . Metastatic colon cancer to liver ([place]) 07/01/2016  . Right Colon cancer metastasized to liver ([place]) 06/17/2016  . Chronic GI bleeding 05/29/2016  . Diabetes mellitus type 2, diet-controlled ([place]) 05/22/2016  . OSA (obstructive sleep apnea) 05/22/2016  . Hyperlipidemia 05/22/2016  . Anemia due to blood loss, chronic   . Antineoplastic chemotherapy induced pancytopenia (CODE) ([place]) 05/21/2016    Past Surgical History:  Procedure Laterality Date  . COLON SURGERY    . IR GENERIC HISTORICAL  09/24/2016   IR CV LINE INJECTION 09/24/2016 [facility]  . IR GENERIC HISTORICAL  09/24/2016   IR US GUIDE VASC ACCESS RIGHT 09/24/2016 [facility]  . IR GENERIC HISTORICAL  09/24/2016   IR FLUORO GUIDE CV LINE RIGHT 09/24/2016 [facility]  . IR GENERIC HISTORICAL  09/30/2016   IR FLUORO GUIDE PORT INSERTION RIGHT 09/30/2016 Markus Daft, MD [facility]  . IR GENERIC HISTORICAL  09/30/2016   IR US GUIDE VASC ACCESS RIGHT 09/30/2016 Markus Daft, MD [facility]  . IR GENERIC HISTORICAL  09/30/2016   IR REMOVAL TUN ACCESS W/ PORT W/O FL MOD SED 09/30/2016 Markus Daft, MD [facility]  . LAPAROSCOPIC RIGHT HEMI COLECTOMY Right 06/17/2016   Procedure: LAPAROSCOPIC ASSISTED  RIGHT HEMI COLECTOMY;  Surgeon: [name], MD;  Location: [facility];  Service: General;  Laterality: Right;  . PORTACATH PLACEMENT Left 07/13/2016   Procedure: INSERTION PORT-A-CATH left  subclavian;  Surgeon: [name], MD;  Location: [facility];  Service: General;  Laterality: Left;  . SHOULDER ACROMIOPLASTY Right 12/20/2014   Procedure: SHOULDER ACROMIOPLASTY;  Surgeon: Melrose Nakayama, MD;  Location: [facility];  Service: Orthopedics;  Laterality: Right;  . SHOULDER ARTHROSCOPY Right 12/20/2014   Procedure: RIGHT ARTHROSCOPY SHOULDER WITH DEBRIDEMENT;  Surgeon: Melrose Nakayama, MD;  Location: [facility];  Service: Orthopedics;   Laterality: Right;  . TESTICLE SURGERY     as teen  . TONSILLECTOMY    . WISDOM TOOTH EXTRACTION          Home Medications    Prior to Admission medications   Medication Sig Start Date End Date Taking? Authorizing Provider  Albuterol Sulfate (PROAIR RESPICLICK) 101 (90 Base) MCG/ACT AEPB Inhale 2 puffs into the lungs every 4 (four) hours as needed (cough and wheezing). 08/24/18   Margarita Mail, PA-C  dronabinol (MARINOL) 5 MG capsule Take 1 capsule (5 mg total) by mouth 2 (two) times daily before a meal. 07/21/18   Brunetta Genera, MD  feeding supplement, ENSURE ENLIVE, (ENSURE ENLIVE) LIQD Take 237 mLs by mouth 2 (two) times daily between meals. 08/20/18   Florencia Reasons, MD  fentaNYL (DURAGESIC) 75 MCG/HR Place 1 patch onto the skin every 3 (three) days. 09/02/18   Brunetta Genera, MD  loperamide (IMODIUM A-D) 2 MG tablet Take 1-2 tablets (2-4 mg total) by mouth 4 (four) times daily as needed for diarrhea or loose stools. 06/22/18   Brunetta Genera, MD  LORazepam (ATIVAN) 0.5 MG tablet Take 1-2 tablets (0.5-1 mg total) by mouth every 8 (eight) hours as needed for anxiety (nausea). 08/04/18   Brunetta Genera, MD  Melatonin 3 MG TABS Take 6 tablets by mouth at bedtime as needed (sleep).  05/14/18   [provider]  Multiple Vitamin (MULTIVITAMIN WITH MINERALS) TABS tablet Take 1 tablet by mouth daily. 08/21/18   Florencia Reasons, MD  ondansetron (ZOFRAN ODT) 4 MG disintegrating tablet Take 1 tablet (4 mg total) by mouth every 8 (eight) hours as needed for nausea or vomiting. 05/08/18   Montine Circle, PA-C  ondansetron (ZOFRAN) 8 MG tablet Take 1 tablet (8 mg total) by mouth 2 (two) times daily as needed for refractory nausea / vomiting. Start on day 3 after chemotherapy. 06/22/18   Brunetta Genera, MD  Oxycodone HCl 10 MG TABS Take 1-2 tablets (10-20 mg total) by mouth every 4 (four) hours as needed. 09/02/18   Brunetta Genera, MD  polyethylene glycol [facility]) packet Take  17 g by mouth daily. 08/20/18   Florencia Reasons, MD  prochlorperazine (COMPAZINE) 10 MG tablet TAKE 1 TABLET BY MOUTH EVERY 6 HOURS AS NEEDED FOR NAUSEA / VOMITING Patient taking differently: Take 10 mg by mouth every 6 (six) hours as needed for nausea or vomiting. TAKE 1 TABLET BY MOUTH EVERY 6 HOURS AS NEEDED FOR NAUSEA / VOMITING 08/04/18   Brunetta Genera, MD  senna-docusate (SENNA S) 8.6-50 MG tablet Take 2 tablets by mouth 2 (two) times daily. Patient taking differently: Take 2 tablets by mouth daily.  08/04/18   Brunetta Genera, MD    Family History Family History  Problem Relation Age of Onset  . Diabetes Other   . Hyperlipidemia Other   . Hypertension Other   . Breast cancer Maternal Aunt 70  . Prostate cancer Maternal Uncle 75  . Lung cancer Paternal Uncle 85  . Stroke Maternal Grandfather 72  .  Cancer Paternal Grandmother 31       dx cancer of pancreas and colon, unknown if separate primaries  . Heart Problems Paternal Grandfather        d. 9  . Prostate cancer Maternal Uncle 18  . Breast cancer Maternal Aunt 75  . Breast cancer Maternal Aunt 68  . Cervical cancer Maternal Aunt 68  . Pancreatic cancer Maternal Aunt 54       d. 58y; heavy smoker  . Colon cancer Paternal Uncle 63       s/p partial colectomy; dx. second colon cancer at age 78-64    Social History Social History   Tobacco Use  . Smoking status: Former Smoker    Packs/day: 1.50    Years: 29.00    Pack years: 43.50    Types: Cigarettes    Last attempt to quit: 12/16/2012    Years since quitting: 5.7  . Smokeless tobacco: Never Used  . Tobacco comment: 1-2 ppd from age [age]y to 2014  Substance Use Topics  . Alcohol use: Yes    Alcohol/week: 3.0 standard drinks    Types: 3 Shots of liquor per week    Comment:  some days  . Drug use: Yes    Types: Marijuana     Allergies   Penicillins   Review of Systems Review of Systems  Constitutional: Negative for fever.  Respiratory: Negative for  cough.   Gastrointestinal: Positive for abdominal pain and vomiting. Negative for diarrhea.  All other systems reviewed and are negative.    Physical Exam Updated Vital Signs BP (!) 134/98 (BP Location: Right Arm)   Pulse (!) 116   Temp 99 F (37.2 C) (Oral)   Resp 20   Ht 6\' 2"  (1.88 m)   Wt 96.6 kg   SpO2 90%   BMI 27.35 kg/m   Physical Exam Vitals signs and nursing note reviewed.  Constitutional:      General: He is not in acute distress.    Appearance: He is well-developed. He is not diaphoretic.  HENT:     Head: Normocephalic and atraumatic.  Neck:     Musculoskeletal: Normal range of motion and neck supple.  Cardiovascular:     Rate and Rhythm: Normal rate and regular rhythm.     Heart sounds: No murmur. No friction rub.  Pulmonary:     Effort: Pulmonary effort is normal. No respiratory distress.     Breath sounds: Normal breath sounds. No wheezing or rales.  Abdominal:     General: Bowel sounds are normal. There is no distension.     Palpations: Abdomen is soft.     Tenderness: There is abdominal tenderness. There is no guarding or rebound.  Musculoskeletal: Normal range of motion.  Skin:    General: Skin is warm and dry.     Coloration: Skin is not jaundiced.  Neurological:     General: No focal deficit present.     Mental Status: He is alert and oriented to person, place, and time.     Coordination: Coordination normal.      ED Treatments / Results  Labs (all labs ordered are listed, but only abnormal results are displayed) Labs Reviewed  CULTURE, BLOOD (ROUTINE X 2)  CULTURE, BLOOD (ROUTINE X 2)  COMPREHENSIVE METABOLIC PANEL  CBC WITH DIFFERENTIAL/PLATELET  PROTIME-INR  URINALYSIS, ROUTINE W REFLEX MICROSCOPIC  I-STAT CG4 LACTIC ACID, ED    EKG None  Radiology No results found.  Procedures Procedures (including critical care time)  Medications Ordered in ED Medications  sodium chloride flush (NS) 0.9 % injection 3 mL (has no  administration in time range)  ondansetron (ZOFRAN) injection 4 mg (has no administration in time range)  HYDROmorphone (DILAUDID) injection 1 mg (has no administration in time range)  sodium chloride 0.9 % bolus 1,000 mL (has no administration in time range)     Initial Impression / Assessment and Plan / ED Course  I have reviewed the triage vital signs and the nursing notes.  Pertinent labs & imaging results that were available during my care of the patient were reviewed by me and considered in my medical decision making (see chart for details).  Patient with history of metastatic colon cancer presenting with complaints of vomiting.  He completed his most recent round of chemo yesterday and has been unable to keep anything down since.  He is afebrile upon arrival, however oxygen saturations are in the low 90s.  He does have a white count of 12.7, however laboratory studies are otherwise within normal limits.  X-ray shows what appears to be a developing infiltrate in the left base.  The patient was recently admitted here the beginning of the month with right-sided pneumonia.  I feel as though the patient will require admission for IV antibiotics given his difficulty tolerating p.o.  I have spoken with the hospitalist who agrees to admit.  CRITICAL CARE Performed by: Veryl Speak Total critical care time: 35 minutes Critical care time was exclusive of separately billable procedures and treating other patients. Critical care was necessary to treat or prevent imminent or life-threatening deterioration. Critical care was time spent personally by me on the following activities: development of treatment plan with patient and/or surrogate as well as nursing, discussions with consultants, evaluation of patient's response to treatment, examination of patient, obtaining history from patient or surrogate, ordering and performing treatments and interventions, ordering and review of laboratory studies,  ordering and review of radiographic studies, pulse oximetry and re-evaluation of patient's condition.   Final Clinical Impressions(s) / ED Diagnoses   Final diagnoses:  None    ED Discharge Orders    None       Veryl Speak, MD 09/04/18 1416

## 2018-09-04 NOTE — ED Triage Notes (Signed)
Patient completed last chemo treatment yesterday.  Reports vomiting all night.  Reports unable to take medication at home.

## 2018-09-05 ENCOUNTER — Inpatient Hospital Stay (HOSPITAL_COMMUNITY): Payer: 59

## 2018-09-05 ENCOUNTER — Telehealth: Payer: Self-pay | Admitting: *Deleted

## 2018-09-05 DIAGNOSIS — Z66 Do not resuscitate: Secondary | ICD-10-CM

## 2018-09-05 DIAGNOSIS — C189 Malignant neoplasm of colon, unspecified: Secondary | ICD-10-CM

## 2018-09-05 DIAGNOSIS — C787 Secondary malignant neoplasm of liver and intrahepatic bile duct: Principal | ICD-10-CM

## 2018-09-05 DIAGNOSIS — Z515 Encounter for palliative care: Secondary | ICD-10-CM

## 2018-09-05 DIAGNOSIS — E785 Hyperlipidemia, unspecified: Secondary | ICD-10-CM

## 2018-09-05 DIAGNOSIS — Z7189 Other specified counseling: Secondary | ICD-10-CM

## 2018-09-05 DIAGNOSIS — G893 Neoplasm related pain (acute) (chronic): Secondary | ICD-10-CM

## 2018-09-05 DIAGNOSIS — R180 Malignant ascites: Secondary | ICD-10-CM

## 2018-09-05 DIAGNOSIS — J181 Lobar pneumonia, unspecified organism: Secondary | ICD-10-CM

## 2018-09-05 LAB — BASIC METABOLIC PANEL
Anion gap: 15 (ref 5–15)
BUN: 23 mg/dL — ABNORMAL HIGH (ref 6–20)
CO2: 29 mmol/L (ref 22–32)
CREATININE: 1.02 mg/dL (ref 0.61–1.24)
Calcium: 8.7 mg/dL — ABNORMAL LOW (ref 8.9–10.3)
Chloride: 93 mmol/L — ABNORMAL LOW (ref 98–111)
GFR calc Af Amer: >60 mL/min (ref >60)
GFR calc non Af Amer: >60 mL/min (ref >60)
Glucose, Bld: 159 mg/dL — ABNORMAL HIGH (ref 70–99)
Potassium: 3.4 mmol/L — ABNORMAL LOW (ref 3.5–5.1)
SODIUM: 137 mmol/L (ref 135–145)

## 2018-09-05 LAB — CBC
HCT: 38.6 % — ABNORMAL LOW (ref 39.0–52.0)
Hemoglobin: 11.5 g/dL — ABNORMAL LOW (ref 13.0–17.0)
MCH: 24.9 pg — ABNORMAL LOW (ref 26.0–34.0)
MCHC: 29.8 g/dL — ABNORMAL LOW (ref 30.0–36.0)
MCV: 83.7 fL (ref 80.0–100.0)
Platelets: 331 10*3/uL (ref 150–400)
RBC: 4.61 MIL/uL (ref 4.22–5.81)
RDW: 17.3 % — ABNORMAL HIGH (ref 11.5–15.5)
WBC: 8.1 10*3/uL (ref 4.0–10.5)
nRBC: 0 % (ref 0.0–0.2)

## 2018-09-05 LAB — GLUCOSE, CAPILLARY
GLUCOSE-CAPILLARY: 153 mg/dL — AB (ref 70–99)
Glucose-Capillary: 155 mg/dL — ABNORMAL HIGH (ref 70–99)
Glucose-Capillary: 142 mg/dL — ABNORMAL HIGH (ref 70–99)
Glucose-Capillary: 125 mg/dL — ABNORMAL HIGH (ref 70–99)

## 2018-09-05 LAB — BODY FLUID CELL COUNT WITH DIFFERENTIAL
Eos, Fluid: 1 %
Lymphs, Fluid: 26 %
Monocyte-Macrophage-Serous Fluid: 70 % (ref 50–90)
Neutrophil Count, Fluid: 3 % (ref 0–25)
Total Nucleated Cell Count, Fluid: 216 cu mm (ref 0–1000)

## 2018-09-05 LAB — MAGNESIUM: MAGNESIUM: 2.5 mg/dL — AB (ref 1.7–2.4)

## 2018-09-05 MED ORDER — ALBUMIN HUMAN 25 % IV SOLN
25.0000 g | Freq: Once | INTRAVENOUS | Status: AC
  Administered 2018-09-05: 25 g via INTRAVENOUS
  Filled 2018-09-05: qty 100

## 2018-09-05 MED ORDER — LIDOCAINE HCL 1 % IJ SOLN
INTRAMUSCULAR | Status: AC
  Filled 2018-09-05: qty 10

## 2018-09-05 MED ORDER — SCOPOLAMINE 1 MG/3DAYS TD PT72
1.0000 | MEDICATED_PATCH | TRANSDERMAL | Status: DC
  Administered 2018-09-05 – 2018-09-08 (×2): 1.5 mg via TRANSDERMAL
  Filled 2018-09-05 (×2): qty 1

## 2018-09-05 MED ORDER — SODIUM CHLORIDE 0.9 % IV SOLN
2.0000 g | INTRAVENOUS | Status: DC
  Administered 2018-09-05: 2 g via INTRAVENOUS
  Filled 2018-09-05: qty 2

## 2018-09-05 MED ORDER — HYDROMORPHONE HCL 1 MG/ML IJ SOLN
0.5000 mg | INTRAMUSCULAR | Status: DC | PRN
  Administered 2018-09-05 (×3): 0.5 mg via INTRAVENOUS
  Administered 2018-09-06: 13:00:00 via INTRAVENOUS
  Administered 2018-09-06 – 2018-09-07 (×7): 0.5 mg via INTRAVENOUS
  Filled 2018-09-05 (×11): qty 1

## 2018-09-05 NOTE — Progress Notes (Signed)
PROGRESS NOTE    Scott Gallagher  IRJ:[id number] DOB: 08-Jan-1971 DOA: 09/04/2018 PCP: [name], MD    Brief Narrative:  48 y.o. 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  He does have history of metastatic colon cancer to liver and is on chemotherapy by oncology as outpatient.  He states that his nausea and vomiting is worsening last 12 hours or so and there is no obvious aggravating or relieving factors.  No hematemesis or melena.  Denies any abdominal pain.  He has been having constant nausea and hiccups.  He did have 3-4 episodes of vomiting yesterday followed by fever chills today.  Patient has abdominal discomfort largely from ascites.  Patient states that he has had ascites tapped at least 2 times last one was last week with 5 L fluid removal.  There was some discussion regarding peritoneal catheter for long-term relief of ascites.  Patient states that he has not been able to lie down largely because of ascites at home.  He however denies any cough, runny nose, fever or chills.  Patient denies any urinary urgency, frequency or dysuria.  He has impaired appetite.  Denies any dizziness, syncope headache.  Penicillin is listed in the computer as allergy but he has received penicillin in the past without any problem.  ED Course: In the ED, patient was also noted to be mildly hypoxic.  Chest x-ray done in the ED showed new infiltrate on the left lung.  Had mild leukocytosis as well.  Patient was then considered for admission to the hospital for persistent nausea, vomiting with possible left sided pneumonia.  Assessment & Plan:   Principal Problem:   Pneumonia Active Problems:   Diabetes mellitus type 2, diet-controlled (HCC)   OSA  (obstructive sleep apnea)   Hyperlipidemia   Metastatic colon cancer to liver (HCC)   Nausea & vomiting   Physical deconditioning   Volume depletion   Hypokalemia   Nausea and vomiting  Mild cough, hypoxia with left-sided infiltrate possibly representing pneumonia. Continue on oxygen, nebulizers and antibiotics with Rocephin and Zithromax.  Pending blood cx. Increase rocephin to 2gm to cover SBP  Nausea, vomiting inability to keep anything down.  Likely secondary to chemotherapy.  Cont PO as tolerated. Started scopolamine.  Volume depletion.  Likely secondary to nausea vomiting.  Will continue with IV fluid hydration. Clinically appears dehydrated on exam with dry membranes  Mild hypokalemia.  Replaced. Will repeat bmet in AM  History of obstructive sleep apnea -not on oxygen or CPAP at home.  Metastatic colon cancer to liver and peritoneal bleed with recurrent ascites. Pt noted to have failed chemo twice prior to admit with consideration for immunotherapy. Family at bedside and have asked about whether or not to proceed with tx or to focus on hospice/comfort. Pt and family are interested in discussing goals of care with Palliative Care. Consulted Palliative Care service. Abd distended. Will request US guided paracentesis. Discussed with IR - likely not candidate for tunneled peritoneal drain as would require both sizable amount of fluid as well as complete eradication of infection. Per above, pt is on rocephin empirically dosed to cover both PNA and SBP  Diet controlled diabetes mellitus type 2.  Cont on diabetic diet if tolerated.  Sliding scale insulin.  Weakness, deconditioning secondary to cancer,  chemotherapy volume depletion.  Will get physical therapy to evaluate.  Ambulate with fall precautions. Cont on hydration   DVT prophylaxis: Lovenox subQ Code Status: DNR Family Communication: Pt in room, family at bedside Disposition Plan: Uncertain at this time  Consultants:     Palliative Care  Procedures:   US guided paracentesis 1/20  Antimicrobials: Anti-infectives (From admission, onward)   Start     Dose/Rate Route Frequency Ordered Stop   09/05/18 1800  cefTRIAXone (ROCEPHIN) 2 g in sodium chloride 0.9 % 100 mL IVPB     2 g 200 mL/hr over 30 Minutes Intravenous Every 24 hours 09/05/18 1019 09/09/18 1759   09/04/18 1800  cefTRIAXone (ROCEPHIN) 1 g in sodium chloride 0.9 % 100 mL IVPB  Status:  Discontinued     1 g 200 mL/hr over 30 Minutes Intravenous Every 24 hours 09/04/18 1510 09/05/18 1019   09/04/18 1600  azithromycin (ZITHROMAX) 500 mg in sodium chloride 0.9 % 250 mL IVPB     500 mg 250 mL/hr over 60 Minutes Intravenous Every 24 hours 09/04/18 1510 09/09/18 1559   09/04/18 1300  vancomycin (VANCOCIN) IVPB 1000 mg/200 mL premix     1,000 mg 200 mL/hr over 60 Minutes Intravenous  Once 09/04/18 1248 09/04/18 1508   09/04/18 1300  piperacillin-tazobactam (ZOSYN) IVPB 3.375 g     3.375 g 12.5 mL/hr over 240 Minutes Intravenous  Once 09/04/18 1248 09/04/18 1407       Subjective: Complaining of nausea and abd fullness  Objective: Vitals:   09/05/18 1353 09/05/18 1359 09/05/18 1402 09/05/18 1512  BP: 128/88 131/90 125/89 (!) 136/92  Pulse:    (!) 109  Resp:    18  Temp:    98.2 F (36.8 C)  TempSrc:    Oral  SpO2:    95%  Weight:      Height:        Intake/Output Summary (Last 24 hours) at 09/05/2018 1518 Last data filed at 09/05/2018 0800 Gross per 24 hour  Intake 1225 ml  Output 300 ml  Net 925 ml   Filed Weights   09/04/18 1033 09/05/18 0549  Weight: 96.6 kg 91.3 kg    Examination:  General exam: Appears calm and comfortable  Respiratory system: Clear to auscultation. Respiratory effort normal. Cardiovascular system: S1 & S2 heard, RRR Gastrointestinal system: Abdomen distended, decreased BS Central nervous system: Alert and oriented. No focal neurological deficits. Extremities: Symmetric 5 x 5 power. Skin: No rashes,  lesions Psychiatry: Judgement and insight appear normal. Mood & affect appropriate.   Data Reviewed: I have personally reviewed following labs and imaging studies  CBC: Recent Labs  Lab 09/01/18 0910 09/04/18 1105 09/05/18 0445  WBC 12.7* 12.6* 8.1  NEUTROABS 10.2* 11.4*  --   HGB 11.4* 12.6* 11.5*  HCT 37.8* 42.1 38.6*  MCV 82.9 84.0 83.7  PLT 346 482* 948   Basic Metabolic Panel: Recent Labs  Lab 09/01/18 0910 09/04/18 1105 09/05/18 0445  NA 137 135 137  K 4.2 3.4* 3.4*  CL 96* 92* 93*  CO2 30 30 29   GLUCOSE 126* 142* 159*  BUN 12 20 23*  CREATININE 0.81 0.83 1.02  CALCIUM 8.7* 8.9 8.7*  MG  --   --  2.5*   GFR: Estimated Creatinine Clearance: 103 mL/min (by C-G formula based on SCr of 1.02 mg/dL). Liver Function Tests: Recent Labs  Lab 09/01/18 0910 09/04/18 1105  AST 21 21  ALT 7 15  ALKPHOS 206* 126  BILITOT  1.0 0.9  PROT 6.2* 6.6  ALBUMIN 2.8* 3.2*   No results for input(s): LIPASE, AMYLASE in the last 168 hours. No results for input(s): AMMONIA in the last 168 hours. Coagulation Profile: Recent Labs  Lab 09/04/18 1105  INR 1.07   Cardiac Enzymes: No results for input(s): CKTOTAL, CKMB, CKMBINDEX, TROPONINI in the last 168 hours. BNP (last 3 results) No results for input(s): PROBNP in the last 8760 hours. HbA1C: No results for input(s): HGBA1C in the last 72 hours. CBG: Recent Labs  Lab 09/04/18 1732 09/04/18 2121 09/05/18 0738 09/05/18 1244  GLUCAP 145* 129* 155* 142*   Lipid Profile: No results for input(s): CHOL, HDL, LDLCALC, TRIG, CHOLHDL, LDLDIRECT in the last 72 hours. Thyroid Function Tests: No results for input(s): TSH, T4TOTAL, FREET4, T3FREE, THYROIDAB in the last 72 hours. Anemia Panel: No results for input(s): VITAMINB12, FOLATE, FERRITIN, TIBC, IRON, RETICCTPCT in the last 72 hours. Sepsis Labs: Recent Labs  Lab 09/04/18 1104 09/04/18 1319  LATICACIDVEN 1.96* 1.29    Recent Results (from the past 240 hour(s))    Body fluid culture     Status: None   Collection Time: 08/29/18  2:57 PM  Result Value Ref Range Status   Specimen Description   Final    PERITONEAL CAVITY Performed at [facility] [address], [place], [place] [address]    Special Requests   Final    Immunocompromised Performed at [facility], [place] [address], [place], [place] [address]    Gram Stain   Final    RARE WBC PRESENT, PREDOMINANTLY MONONUCLEAR NO ORGANISMS SEEN    Culture   Final    NO GROWTH 3 DAYS Performed at [facility], [place] [address] [place], [place] [address]    Report Status 09/02/2018 FINAL  Final  Culture, blood (Routine x 2)     Status: None (Preliminary result)   Collection Time: 09/04/18 11:05 AM  Result Value Ref Range Status   Specimen Description   Final    RIGHT ANTECUBITAL Performed at [facility] [address] [place], [place] [address]    Special Requests   Final    BOTTLES DRAWN AEROBIC AND ANAEROBIC Blood Culture results may not be optimal due to an excessive volume of blood received in culture bottles Performed at [facility] [address], [place], [place] [address]    Culture   Final    NO GROWTH < 24 HOURS Performed at [facility] [address], [place], [place] [address]    Report Status PENDING  Incomplete  Culture, blood (Routine x 2)     Status: None (Preliminary result)   Collection Time: 09/04/18 11:16 AM  Result Value Ref Range Status   Specimen Description   Final    BLOOD RIGHT CHEST Performed at [facility] [address], [place], [place] [address]    Special Requests   Final    BOTTLES DRAWN AEROBIC AND ANAEROBIC Blood Culture adequate volume Performed at [facility] [address], [place], [place] [address]    Culture   Final    NO GROWTH < 24 HOURS Performed at [facility] [address] [place], [place]  [address]    Report Status PENDING  Incomplete     Radiology Studies: Dg Chest 2 View  Result Date: 09/04/2018 CLINICAL DATA:  Colon carcinoma.  Vomiting. EXAM: CHEST - 2 VIEW COMPARISON:  August 23, 2018 FINDINGS: Port-A-Cath tip is in the superior vena cava. No pneumothorax. There is stable elevation of the left hemidiaphragm. There is atelectasis in the left lower lobe with suspected degree of superimposed consolidation. Right lung is clear. Heart size and pulmonary vascularity are normal. No adenopathy. No bone lesions. IMPRESSION: Atelectasis left lower lobe with suspected early pneumonia superimposed in this area. Lungs elsewhere clear. Cardiac silhouette within normal limits. No adenopathy. Stable elevation left hemidiaphragm. Port-A-Cath tip in superior vena cava. Electronically Signed   By: Lowella Grip III M.D.   On: 09/04/2018 10:53   US Paracentesis  Result Date: 09/05/2018 INDICATION: Patient with history of metastatic colon cancer, recurrent ascites. Request made for diagnostic and therapeutic paracentesis. EXAM: ULTRASOUND GUIDED DIAGNOSTIC AND THERAPEUTIC PARACENTESIS MEDICATIONS: None COMPLICATIONS: None immediate. PROCEDURE: Informed written consent was obtained from the patient after a discussion of the risks, benefits and alternatives to treatment. A timeout was performed prior to the initiation of the procedure. Initial ultrasound scanning demonstrates a small amount of ascites within the left mid to lower abdominal quadrant. The left mid to lower abdomen was prepped and draped in the usual sterile fashion. 1% lidocaine was used for local anesthesia. Following this, a 19 gauge, 7-cm, Yueh catheter was introduced. An ultrasound image was saved for documentation purposes. The paracentesis was performed. The catheter was removed and a dressing was applied. The patient tolerated the procedure well without immediate post procedural complication. FINDINGS: A total of approximately 1.4  liters of yellow fluid was removed. Samples were sent to the laboratory as requested by the clinical team. IMPRESSION: Successful ultrasound-guided diagnostic and therapeutic paracentesis yielding 1.4 liters of peritoneal fluid. Read by: Rowe Robert, PA-C Electronically Signed   By: Marybelle Killings M.D.   On: 09/05/2018 14:28    Scheduled Meds: . dronabinol  5 mg Oral BID AC  . enoxaparin (LOVENOX) injection  40 mg Subcutaneous Q24H  . feeding supplement  1 Container Oral TID BM  . fentaNYL  1 patch Transdermal Q72H  . insulin aspart  0-9 Units Subcutaneous TID WC  . lidocaine      . multivitamin with minerals  1 tablet Oral Daily  . scopolamine  1 patch Transdermal Q72H  . senna-docusate  2 tablet Oral BID  . sodium chloride flush  3 mL Intravenous Once   Continuous Infusions: . 0.45 % NaCl with KCl 20 mEq / L 100 mL/hr at 09/05/18 0600  . sodium chloride 1,000 mL (09/04/18 1310)  . azithromycin 500 mg (09/04/18 1607)  . cefTRIAXone (ROCEPHIN)  IV    . chlorproMAZINE (THORAZINE) IV 25 mg (09/05/18 0024)     LOS: 1 day   Marylu Lund, MD [facility] Pager On Amion  If 7PM-7AM, please contact night-coverage 09/05/2018, 3:18 PM

## 2018-09-05 NOTE — Progress Notes (Signed)
HEMATOLOGY/ONCOLOGY INPATIENT PROGRESS NOTE  Date of Service: 09/05/2018  Inpatient Attending: .Donne Hazel, MD   SUBJECTIVE:   Scott Gallagher is accompanied today by his wife at bedside.   The pt reports that he began throwing up every 15-30 minutes on the night of 09/03/18. He then presented to the ED yesterday 09/04/18. He endorses nausea as well. The pt denies any fevers or chills. The pt notes that he has been vomiting today as well. The patient's wife notes that his vomit is green. He adds that he is having small bowel movements and is passing a little gas. He endorses pain in his central abdomen, and had a 1.5 liter of fluid removed from his abdomen earlier today. He is also having intermittent hiccups.  The pt's wife notes that over the last several days, he has been staying in bed except to use the restroom. The pt lives in a condo on the third floor, which does not have an elevator.   Lab results today (09/05/18) of CBC and BMP is as follows: all values are WNL except for HGB at 11.5, HCT at 38.6, MCH at 24.9, MCHC at 29.8, RDW at 17.3, Potassium at 3.4, Chloride at 93, Glucose at 159, BUN at 23, Calcium at 8.7.  On review of systems, pt reports nausea, vomiting, small bowel movements, central abdominal pain, and denies fevers, chills, and any other symptoms.   OBJECTIVE:  Appears more fatigued and sleepy.  PHYSICAL EXAMINATION: . Vitals:   09/05/18 1353 09/05/18 1359 09/05/18 1402 09/05/18 1512  BP: 128/88 131/90 125/89 (!) 136/92  Pulse:    (!) 109  Resp:    18  Temp:    98.2 F (36.8 C)  TempSrc:    Oral  SpO2:    95%  Weight:      Height:       Filed Weights   09/04/18 1033 09/05/18 0549  Weight: 213 lb (96.6 kg) 201 lb 4.5 oz (91.3 kg)   .Body mass index is 25.84 kg/m.  GENERAL:alert, in no acute distress and comfortable SKIN: skin color, texture, turgor are normal, no rashes or significant lesions EYES: normal, conjunctiva are pink and non-injected,  sclera clear OROPHARYNX:no exudate, no erythema and lips, buccal mucosa, and tongue normal  NECK: supple, no JVD, thyroid normal size, non-tender, without nodularity LYMPH:  no palpable lymphadenopathy in the cervical, axillary or inguinal LUNGS: clear to auscultation with normal respiratory effort HEART: regular rate & rhythm,  no murmurs and no lower extremity edema ABDOMEN: abdomen soft, non-tender, normoactive bowel sounds  Musculoskeletal: no cyanosis of digits and no clubbing  PSYCH: alert & oriented x 3 with fluent speech NEURO: no focal motor/sensory deficits  MEDICAL HISTORY:  Past Medical History:  Diagnosis Date  . Anemia   . Arthritis   . Colon cancer ([place]) dx'd 05/2016  . Diabetes mellitus without complication ([facility])    diet controlled  . History of blood transfusion   . Hyperlipidemia   . liver mets dx'd 05/2016  . Sleep apnea    cpap  . Wears glasses     SURGICAL HISTORY: Past Surgical History:  Procedure Laterality Date  . COLON SURGERY    . IR GENERIC HISTORICAL  09/24/2016   IR CV LINE INJECTION 09/24/2016 [facility]  . IR GENERIC HISTORICAL  09/24/2016   IR US GUIDE VASC ACCESS RIGHT 09/24/2016 [facility]  . IR GENERIC HISTORICAL  09/24/2016   IR FLUORO GUIDE CV LINE RIGHT 09/24/2016 [facility]  .  IR GENERIC HISTORICAL  09/30/2016   IR FLUORO GUIDE PORT INSERTION RIGHT 09/30/2016 Markus Daft, MD [facility]  . IR GENERIC HISTORICAL  09/30/2016   IR US GUIDE VASC ACCESS RIGHT 09/30/2016 Markus Daft, MD [facility]  . IR GENERIC HISTORICAL  09/30/2016   IR REMOVAL TUN ACCESS W/ PORT W/O FL MOD SED 09/30/2016 Markus Daft, MD [facility]  . LAPAROSCOPIC RIGHT HEMI COLECTOMY Right 06/17/2016   Procedure: LAPAROSCOPIC ASSISTED  RIGHT HEMI COLECTOMY;  Surgeon: [name], MD;  Location: [facility];  Service: General;  Laterality: Right;  . PORTACATH PLACEMENT Left 07/13/2016   Procedure: INSERTION PORT-A-CATH left subclavian;  Surgeon: [name], MD;  Location: [facility];  Service: General;  Laterality: Left;  . SHOULDER ACROMIOPLASTY Right 12/20/2014   Procedure: SHOULDER ACROMIOPLASTY;  Surgeon: Melrose Nakayama, MD;  Location: [facility];  Service: Orthopedics;  Laterality: Right;  . SHOULDER ARTHROSCOPY Right 12/20/2014   Procedure: RIGHT ARTHROSCOPY SHOULDER WITH DEBRIDEMENT;  Surgeon: Melrose Nakayama, MD;  Location: [facility];  Service: Orthopedics;  Laterality: Right;  . TESTICLE SURGERY     as teen  . TONSILLECTOMY    . WISDOM TOOTH EXTRACTION      SOCIAL HISTORY: Social History   Socioeconomic History  . Marital status: Married    Spouse name: Not on file  . Number of children: Not on file  . Years of education: Not on file  . Highest education level: Not on file  Occupational History  . Occupation: cemetery maintenance  Social Needs  . Financial resource strain: Not hard at all  . Food insecurity:    Worry: Patient refused    Inability: Patient refused  . Transportation needs:    Medical: Patient refused    Non-medical: Patient refused  Tobacco Use  . Smoking status: Former Smoker    Packs/day: 1.50    Years: 29.00    Pack years: 43.50    Types: Cigarettes    Last attempt to quit: 12/16/2012    Years since quitting: 5.7  . Smokeless tobacco: Never Used  . Tobacco comment: 1-2 ppd from age [age]y to 2014  Substance and Sexual Activity  . Alcohol use: Yes    Alcohol/week: 3.0 standard drinks    Types: 3 Shots of liquor per week    Comment:  some days  . Drug use: Yes    Types: Marijuana  . Sexual activity: Not Currently    Birth control/protection: None  Lifestyle  . Physical activity:    Days per week: Patient refused    Minutes per session: Patient refused  . Stress: Only a little  Relationships  . Social connections:    Talks on phone: Patient refused    Gets together: Patient refused    Attends religious service: Patient refused    Active member of club or organization: Patient refused      Attends meetings of clubs or organizations: Patient refused    Relationship status: Patient refused  . Intimate partner violence:    Fear of current or ex partner: Patient refused    Emotionally abused: Patient refused    Physically abused: Patient refused    Forced sexual activity: Patient refused  Other Topics Concern  . Not on file  Social History Narrative  . Not on file    FAMILY HISTORY: Family History  Problem Relation Age of Onset  . Diabetes Other   . Hyperlipidemia Other   . Hypertension Other   . Breast  cancer Maternal Aunt 70  . Prostate cancer Maternal Uncle 75  . Lung cancer Paternal Uncle 35  . Stroke Maternal Grandfather 72  . Cancer Paternal Grandmother 109       dx cancer of pancreas and colon, unknown if separate primaries  . Heart Problems Paternal Grandfather        d. 29  . Prostate cancer Maternal Uncle 22  . Breast cancer Maternal Aunt 75  . Breast cancer Maternal Aunt 68  . Cervical cancer Maternal Aunt 68  . Pancreatic cancer Maternal Aunt 54       d. 58y; heavy smoker  . Colon cancer Paternal Uncle 33       s/p partial colectomy; dx. second colon cancer at age 71-64    ALLERGIES:  is allergic to penicillins.  MEDICATIONS:  Scheduled Meds: . dronabinol  5 mg Oral BID AC  . enoxaparin (LOVENOX) injection  40 mg Subcutaneous Q24H  . feeding supplement  1 Container Oral TID BM  . fentaNYL  1 patch Transdermal Q72H  . insulin aspart  0-9 Units Subcutaneous TID WC  . lidocaine      . multivitamin with minerals  1 tablet Oral Daily  . scopolamine  1 patch Transdermal Q72H  . senna-docusate  2 tablet Oral BID  . sodium chloride flush  3 mL Intravenous Once   Continuous Infusions: . 0.45 % NaCl with KCl 20 mEq / L 100 mL/hr at 09/05/18 1525  . sodium chloride 1,000 mL (09/04/18 1310)  . azithromycin 500 mg (09/04/18 1607)  . cefTRIAXone (ROCEPHIN)  IV    . chlorproMAZINE (THORAZINE) IV 25 mg (09/05/18 1555)   PRN Meds:.sodium chloride,  acetaminophen **OR** acetaminophen, albuterol, chlorproMAZINE (THORAZINE) IV, HYDROmorphone (DILAUDID) injection, loperamide, LORazepam, ondansetron (ZOFRAN) IV, polyethylene glycol, promethazine  REVIEW OF SYSTEMS:    10 Point review of Systems was done is negative except as noted above.   LABORATORY DATA:  I have reviewed the data as listed  . CBC Latest Ref Rng & Units 09/05/2018 09/04/2018 09/01/2018  WBC 4.0 - 10.5 K/uL 8.1 12.6(H) 12.7(H)  Hemoglobin 13.0 - 17.0 g/dL 11.5(L) 12.6(L) 11.4(L)  Hematocrit 39.0 - 52.0 % 38.6(L) 42.1 37.8(L)  Platelets 150 - 400 K/uL 331 482(H) 346    . CMP Latest Ref Rng & Units 09/05/2018 09/04/2018 09/01/2018  Glucose 70 - 99 mg/dL 159(H) 142(H) 126(H)  BUN 6 - 20 mg/dL 23(H) 20 12  Creatinine 0.61 - 1.24 mg/dL 1.02 0.83 0.81  Sodium 135 - 145 mmol/L 137 135 137  Potassium 3.5 - 5.1 mmol/L 3.4(L) 3.4(L) 4.2  Chloride 98 - 111 mmol/L 93(L) 92(L) 96(L)  CO2 22 - 32 mmol/L 29 30 30   Calcium 8.9 - 10.3 mg/dL 8.7(L) 8.9 8.7(L)  Total Protein 6.5 - 8.1 g/dL - 6.6 6.2(L)  Total Bilirubin 0.3 - 1.2 mg/dL - 0.9 1.0  Alkaline Phos 38 - 126 U/L - 126 206(H)  AST 15 - 41 U/L - 21 21  ALT 0 - 44 U/L - 15 7     RADIOGRAPHIC STUDIES: I have personally reviewed the radiological images as listed and agreed with the findings in the report. Ct Abdomen Pelvis Wo Contrast  Result Date: 08/18/2018 CLINICAL DATA:  Acute onset of fever, nausea and vomiting. Constipation. On chemotherapy for colon cancer with liver metastases. EXAM: CT ABDOMEN AND PELVIS WITHOUT CONTRAST TECHNIQUE: Multidetector CT imaging of the abdomen and pelvis was performed following the standard protocol without IV contrast. COMPARISON:  CT of the  abdomen and pelvis performed 06/12/2018 FINDINGS: Lower chest: Mild patchy airspace opacities at the right lung base raise concern for pneumonia. Mild left basilar atelectasis is noted. Scattered small nodules at the right lung base measure up to 9 mm in  size. Metastatic disease cannot be excluded. The visualized portions of the mediastinum are unremarkable. Hepatobiliary: Diffuse peripheral metastatic lesions are noted throughout the liver, more prominent than in October. These are difficult to fully characterize without contrast. High attenuation within the gallbladder may reflect residual contrast, as on the prior study. The common bile duct stent is noted in expected position. Pancreas: The pancreas is within normal limits. Spleen: The spleen is unremarkable in appearance. Adrenals/Urinary Tract: The adrenal glands are unremarkable in appearance. A small left renal cyst is noted. Nonobstructing left renal stones measure up to 6 mm in size. Nonspecific perinephric stranding is noted bilaterally. There is no evidence of hydronephrosis. No obstructing ureteral stones are identified. Stomach/Bowel: The patient is status post right-sided hemicolectomy. The ileocolic anastomosis is grossly unremarkable in appearance the remaining colon is unremarkable in appearance. The small bowel is grossly unremarkable, though fluid is seen tracking about small bowel loops. The stomach is decompressed and grossly unremarkable in appearance. Vascular/Lymphatic: Scattered calcification is seen along the abdominal aorta and its branches. The abdominal aorta is otherwise grossly unremarkable. The inferior vena cava is grossly unremarkable. No retroperitoneal lymphadenopathy is seen. No pelvic sidewall lymphadenopathy is identified. Reproductive: The bladder is mildly distended and grossly unremarkable. The prostate is normal in size. Other: Small to moderate volume ascites is noted within the abdomen and pelvis, new from the prior study. Underlying peritoneal carcinomatosis is suspected. Musculoskeletal: No acute osseous abnormalities are identified. The visualized musculature is unremarkable in appearance. IMPRESSION: 1. Mild patchy airspace opacities at the right lung base raise  concern for pneumonia. 2. Small to moderate volume ascites within the abdomen and pelvis, new from the prior study. Underlying peritoneal carcinomatosis suspected. 3. Diffuse peripheral metastatic lesions throughout the liver, more prominent than in October. These are difficult to fully characterize without contrast. 4. Nonobstructing left renal stones measure up to 6 mm in size. Small left renal cyst. 5. Small nodules at the right lung base measure up to 9 mm in size. Metastatic disease cannot be excluded. Aortic Atherosclerosis (ICD10-I70.0). Electronically Signed   By: Garald Balding M.D.   On: 08/18/2018 05:33   Dg Chest 2 View  Result Date: 09/04/2018 CLINICAL DATA:  Colon carcinoma.  Vomiting. EXAM: CHEST - 2 VIEW COMPARISON:  August 23, 2018 FINDINGS: Port-A-Cath tip is in the superior vena cava. No pneumothorax. There is stable elevation of the left hemidiaphragm. There is atelectasis in the left lower lobe with suspected degree of superimposed consolidation. Right lung is clear. Heart size and pulmonary vascularity are normal. No adenopathy. No bone lesions. IMPRESSION: Atelectasis left lower lobe with suspected early pneumonia superimposed in this area. Lungs elsewhere clear. Cardiac silhouette within normal limits. No adenopathy. Stable elevation left hemidiaphragm. Port-A-Cath tip in superior vena cava. Electronically Signed   By: Lowella Grip III M.D.   On: 09/04/2018 10:53   Dg Chest 2 View  Result Date: 08/23/2018 CLINICAL DATA:  Pneumonia EXAM: CHEST - 2 VIEW COMPARISON:  08/17/2018, 12/23/2017 FINDINGS: Right-sided central venous port tip over the SVC. No acute consolidation. Trace pleural effusion. Normal heart size. No pneumothorax. IMPRESSION: Trace pleural effusions.  No focal airspace disease. Electronically Signed   By: Donavan Foil M.D.   On: 08/23/2018 23:37  Dg Chest 2 View  Result Date: 08/17/2018 CLINICAL DATA:  Colon cancer. Fever. EXAM: CHEST - 2 VIEW COMPARISON:   12/23/2017 chest radiograph. FINDINGS: Right internal jugular Port-A-Cath terminates in middle third of the SVC. Stable cardiomediastinal silhouette with normal heart size. No pneumothorax. No pleural effusion. Mild elevation of the left hemidiaphragm. No pulmonary edema. Hazy posterior lung base opacity on the lateral view, favor basilar right lower lobe opacity. IMPRESSION: Hazy posterior lung base opacity on the lateral view, favor basilar right lower lobe opacity, which could represent atelectasis or pneumonia. Follow-up chest radiographs advised. Electronically Signed   By: Ilona Sorrel M.D.   On: 08/17/2018 22:50   Ct Abdomen Pelvis W Contrast  Result Date: 08/29/2018 CLINICAL DATA:  Stage IV ascending colon cancer diagnosed 2017 with liver metastases. Ongoing chemotherapy. Patient presents with generalized abdominal pain and distention, nausea and vomiting. Status post paracentesis 08/20/2018. EXAM: CT ABDOMEN AND PELVIS WITH CONTRAST TECHNIQUE: Multidetector CT imaging of the abdomen and pelvis was performed using the standard protocol following bolus administration of intravenous contrast. CONTRAST:  157m ISOVUE-300 IOPAMIDOL (ISOVUE-300) INJECTION 61% COMPARISON:  08/18/2018 CT abdomen/pelvis. FINDINGS: Lower chest: Small dependent bilateral pleural effusions, increased bilaterally since 08/18/2018 CT. Mild-to-moderate compressive atelectasis at the dependent lung bases bilaterally. Scattered nodularity along right major fissure measuring up to 5 mm (series 4/image 23), stable. Previously described 9 mm right lower lobe pulmonary nodule on 08/18/2018 CT is obscured by atelectasis on today's scan. Hepatobiliary: Several hypodense lesions scattered throughout the periphery of the liver, not appreciably changed since noncontrast 08/18/2018 CT, increased from 06/12/2018 CT. For example a 5.0 cm posterosuperior right liver lobe lesion (series 2/image 22), increased from 4.5 cm on 06/12/2018 CT. Lateral  segment left liver lobe 1.7 cm lesion (series 2/image 35), previously 1.6 cm. Posterior inferior right liver lobe 4.8 cm confluent lesion (series 2/image 39), increased from 3.8 cm. Contracted gallbladder without interval change. CBD stent is in place with the distal tip in the duodenal lumen. Expected pneumobilia within the nondilated intrahepatic bile ducts. Pancreas: Normal, with no mass or duct dilation. Spleen: Normal size. No mass. Adrenals/Urinary Tract: Normal adrenals. Nonobstructing 2 mm interpolar left renal stone. No hydronephrosis. Simple 1.4 cm interpolar right renal cyst. Simple 2.1 cm lower left renal cyst. Normal bladder. Stomach/Bowel: Normal non-distended stomach. Postsurgical changes from right hemicolectomy with ileocolic anastomosis in the right abdomen. No small bowel dilatation or small bowel wall thickening. No wall thickening within the remnant large-bowel. Vascular/Lymphatic: Atherosclerotic nonaneurysmal abdominal aorta. Patent portal, splenic, hepatic and renal veins. Stable mildly enlarged 1.0 cm porta hepatis node (series 2/image 36). No additional pathologically enlarged lymph nodes in the abdomen or pelvis. Reproductive: Normal size prostate. Other: No pneumoperitoneum. Diffuse peritoneal thickening and enhancement, most parent in the right pericolic gutter, probably unchanged compared to the noncontrast CT on 08/18/2018. Moderate to large volume ascites. Soft tissue caking within the left upper quadrant peritoneal fat, unchanged. Scattered mesenteric nodularity, unchanged, for example a 1.4 cm central mesenteric nodule (series 2/image 58). Musculoskeletal: Irregular hyperenhancing nodular soft tissue along midline ventral laparotomy scar measuring up to 1.6 cm (series 2/image 79), not appreciably changed. Mild thoracolumbar spondylosis. IMPRESSION: 1. 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 2. Scattered liver metastases are mildly increased from 06/12/2018 CT, unchanged from 08/18/2018 CT. 3. Stable mild porta hepatis adenopathy. 4. Small dependent bilateral pleural  effusions, increased bilaterally. Stable mild right lung base pleural nodularity. 5. Stable malignant appearing subcutaneous nodularity associated with the midline laparotomy scar in the ventral abdomen. 6. Stable well-positioned patent CBD stent. 7. No evidence of bowel obstruction or acute bowel inflammation. 8. Nonobstructing left nephrolithiasis. 9.  Aortic Atherosclerosis (ICD10-I70.0). Electronically Signed   By: Ilona Sorrel M.D.   On: 08/29/2018 14:35   US Paracentesis  Result Date: 09/05/2018 INDICATION: Patient with history of metastatic colon cancer, recurrent ascites. Request made for diagnostic and therapeutic paracentesis. EXAM: ULTRASOUND GUIDED DIAGNOSTIC AND THERAPEUTIC PARACENTESIS MEDICATIONS: None COMPLICATIONS: None immediate. PROCEDURE: Informed written consent was obtained from the patient after a discussion of the risks, benefits and alternatives to treatment. A timeout was performed prior to the initiation of the procedure. Initial ultrasound scanning demonstrates a small amount of ascites within the left mid to lower abdominal quadrant. The left mid to lower abdomen was prepped and draped in the usual sterile fashion. 1% lidocaine was used for local anesthesia. Following this, a 19 gauge, 7-cm, Yueh catheter was introduced. An ultrasound image was saved for documentation purposes. The paracentesis was performed. The catheter was removed and a dressing was applied. The patient tolerated the procedure well without immediate post procedural complication. FINDINGS: A total of approximately 1.4 liters of yellow fluid was removed. Samples were sent to the laboratory as requested by the clinical team. IMPRESSION:  Successful ultrasound-guided diagnostic and therapeutic paracentesis yielding 1.4 liters of peritoneal fluid. Read by: Rowe Robert, PA-C Electronically Signed   By: Marybelle Killings M.D.   On: 09/05/2018 14:28   US Paracentesis  Result Date: 08/29/2018 INDICATION: Patient with history of metastatic colon cancer, recurrent ascites. Request made for therapeutic paracentesis up to 5 liters. EXAM: ULTRASOUND GUIDED THERAPEUTIC PARACENTESIS MEDICATIONS: None COMPLICATIONS: None immediate. PROCEDURE: Informed written consent was obtained from the patient after a discussion of the risks, benefits and alternatives to treatment. A timeout was performed prior to the initiation of the procedure. Initial ultrasound scanning demonstrates a large amount of ascites within the left mid to lower abdominal quadrant. The left mid to lower abdomen was prepped and draped in the usual sterile fashion. 1% lidocaine was used for local anesthesia. Following this, a 6 Fr Safe-T-Centesis catheter was introduced. An ultrasound image was saved for documentation purposes. The paracentesis was performed. The catheter was removed and a dressing was applied. The patient tolerated the procedure well without immediate post procedural complication. FINDINGS: A total of approximately 5 liters of yellow fluid was removed. IMPRESSION: Successful ultrasound-guided therapeutic paracentesis yielding 5 liters of peritoneal fluid. Read by: Rowe Robert, PA-C Electronically Signed   By: Jerilynn Mages.  Shick M.D.   On: 08/29/2018 16:58   US Paracentesis  Result Date: 08/20/2018 INDICATION: Ascites EXAM: ULTRASOUND GUIDED  PARACENTESIS MEDICATIONS: None. COMPLICATIONS: None immediate. PROCEDURE: Informed written consent was obtained from the patient after a discussion of the risks, benefits and alternatives to treatment. A timeout was performed prior to the initiation of the procedure. Initial ultrasound scanning demonstrates a moderate amount of ascites within the  right lower abdominal quadrant. The left lower abdomen was prepped and draped in the usual sterile fashion. 1% lidocaine with epinephrine was used for local anesthesia. Following this, a 6 Fr Safe-T-Centesis catheter was introduced. An ultrasound image was saved for documentation purposes. The paracentesis was performed. The catheter was removed and a dressing was applied. The patient tolerated the procedure well without immediate post procedural complication. FINDINGS: A total of approximately 3.6 L of clear  yellow fluid was removed. Samples were sent to the laboratory as requested by the clinical team. IMPRESSION: Successful ultrasound-guided paracentesis yielding 3.6 liters of peritoneal fluid. Electronically Signed   By: Marybelle Killings M.D.   On: 08/20/2018 14:02    ASSESSMENT & PLAN:   48 y.o. 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 KRAS mutated BRAF, NRAS mutation neg MSI Stable.  PET/CT scan done on 06/25/2016 Show multiple foci of hypermetabolic hepatic metastases (atleast 4-5) and 2 hypermetabolic peritoneal nodules along the dorsal peritoneal surface concerning for peritoneal metastases.  MRI Liver 07/27/2016 - Multiple small liver metastases throughout the right and left hepatic lobes, largest measuring 2.1 cm. 1.2 cm enhancing peritoneal nodule in left paracolic gutter, suspicious for peritoneal metastasis. Other small peritoneal nodules better visualized on recent PET-CT which showed hypermetabolic activity, also suspicious for peritoneal metastases.   MRI Abd 10/12/2016 at [facility] --shows improvement in all the liver and the 2 peritoneal lesions and no new lesions.  MRI liver and CT C/A/P on 01/01/2017 as noted above showed improved/stable disease.   MRI Liver 07/01/2017 at [facility] -  1.Three T2  hyperintense metastatic lesions are seen in the liver, with the largest again in segment 8. These appear unchanged from prior, again without appreciable enhancement or restricted diffusion. Several of the previously noted tiny lesions are no longer visualized. No new lesions identified.  2.Treatment related changes in the posterior right hepatic lobe. 3.A peritoneal nodule adjacent to the splenic flexure is now more conspicuous compared to the prior study but appears similar to more remote imaging and again could relate to neoplastic disease, although is indeterminate.  CT Abd/pelvis 08/23/2017: Interval resolution of previously seen small right hepatic lobe lesion. Stable tiny sub-cm nodule along the capsular surface of the right hepatic lobe. No new or progressive disease identified. Stable hepatic steatosis with areas of fatty sparing.  MRI 09/30/17:  Decreased sizes of multiple T2 hyperintense metastatic lesions (in the setting of patient's known colon adenocarcinoma with mucinous features) in both hepatic lobes (the majority of which are tiny). No new lesions. Posttreatment changes within the right hepatic lobe. Other findings, as detailed above.   12/09/17 CT C/A/P with pt which revealed Suspected developing omental/peritoneal metastasis, as evidenced by new and increased omental nodularity.  -CEA levels are slowing increasing and on 4/419 were increased to 11.87 and today are up to 27.26  02/10/18 MRI Liver Post treatment changes in the posterior right lobe. No significant change in the peripherally enhancing cystic lesion in segment 6 and another subcentimeter punctate T2 hyperintensity is identified in segment 7. The remaining previously reported lesions are not seen on today's exam. Continued attention on follow-up imaging is advised. 2.Interval development of multiple new peritoneal implants in the right anterior abdomen as detailed above and interval increase in size of the implant adjacent  to the splenic flexure  02/21/18 CT C/A/P revealed Multiple scattered peritoneal implants in the upper to mid abdomen are similar compared to 02/10/2018. 2. Additional tiny soft tissue implants in the lower abdomen and pelvis are technically new compared to 01/01/2017. Advise continued attention on follow-up.   05/08/18 CT A/P revealed Coronary arteriosclerosis. 2. Steatosis of the liver with indeterminate areas of hypodensity within the right hepatic lobe possibly representing post treatment change. New subtle lesions are not entirely excluded. Short-term interval follow-up and attention  to these areas is recommended. MRI may also prove useful for better assessment with IV contrast. 3. Left upper pole parapelvic cyst, stable in appearance measuring approximately 2 cm in diameter. No worrisome features. 4. Small mesenteric nodules consistent with mildly enlarged mesenteric lymph nodes or peritoneal metastasis are noted in the right hemiabdomen and ventral upper abdomen. These are not definitively identified on prior exam and suspect these are new or have enlarged in the interim. 5. Mechanical bowel obstruction or inflammation. 6. New ventral midline surgical scarring post surgical change suspected in the mesentery.   06/12/18 CT A/P revealed Increased size and number of liver metastases. 2. Gallbladder is contracted and has a thickened wall, suspicious for acute cholecystitis. Unchanged appearance of biliary stent. 3. Recommend further evaluation with RIGHT UPPER QUADRANT ultrasound evaluated gallbladder. 4. Splenomegaly. 5. Increased mesenteric nodules, consistent with metastatic disease. 6. Small, irregular collections of mesenteric and pelvic fluid, suspicious for metastatic disease. 7.  Aortic atherosclerosis.   07/29/18 MRI Liver did not reveal anything new from the last CT A/P from 06/12/18  08/29/18 CT A/P revealed Evidence of peritoneal carcinomatosis with recurrent moderate to large volume malignant  ascites, with diffuse peritoneal thickening and enhancement, left upper quadrant peritoneal fat soft tissue caking and scattered mesenteric implants. 2. Scattered liver metastases are mildly increased from 06/12/2018 CT, unchanged from 08/18/2018 CT. 3. Stable mild porta hepatis adenopathy. 4. Small dependent bilateral pleural effusions, increased bilaterally. Stable mild right lung base pleural nodularity. 5. Stable malignant appearing subcutaneous nodularity associated with the midline laparotomy scar in the ventral abdomen. 6. Stable well-positioned patent CBD stent. 7. No evidence of bowel obstruction or acute bowel inflammation. 8. Nonobstructing left nephrolithiasis. 9.  Aortic Atherosclerosis  2) Cancer related pain  3) LLL pneumonia  PLAN:  -Discussed pt labwork today, 09/05/18; WBC normal at 8.1k, HGB at 11.5, PLT normal at 331k.  -Discussed my concern that the patient will not be able to tolerate further treatment and concern over his current increasing symptom burden, recurrent hospitalization and worsening performance status, limitation to po intake and inability to maintain nutritional status. -Discussed with the patient and his wife that at this time, I believe it would be of most benefit to the pt to begin focusing purely on comfort measures, which he agrees to.  -would recommend referral to setting up hospice prior to discharge to ensure smooth care transition on continued symptom management. Home hospice vs [facility] based on patients/familys wishes and care needs -appreciate input from palliative care for symptom management in the hospital and goals of care discussion. -appreciate care by hospital medicine. -had extensive discussion and provided answers and support to patient and his wife at bedside. .consider IR consult for indwelling peritoneal catheter if can be done safely and there is enough fluid to continue tapping. -we shall cancel plans for immunotherapy. -optimize mx of  ileus/constipation -if persistent nausea - might need AXR to r/o obstruction.  The total time spent in the appt was 30 minutes and more than 50% was on counseling and direct patient cares.    Sullivan Lone MD MS AAHIVMS [facility] Hematology/Oncology Physician [facility]  (Office):       [phone] (Work cell):  [phone] (Fax):           [phone]  09/05/2018 4:30 PM   I, Baldwin Jamaica, am acting as a scribe for Dr. Sullivan Lone.   .I have reviewed the above documentation for accuracy and completeness, and I agree with the  above. Sullivan Lone MD MS

## 2018-09-05 NOTE — Telephone Encounter (Signed)
Wife called to inform Dr. Irene Limbo that patient was in hospital - Dr. Irene Limbo informed.

## 2018-09-05 NOTE — Consult Note (Signed)
Consultation Note Date: 09/05/2018   Patient Name: Scott Gallagher  DOB: 11-27-1970  MRN: [id number]  Age / Sex: 48 y.o., male  PCP: [name], MD Referring Physician: Donne Hazel, MD  Reason for Consultation: Establishing goals of care, Pain control and Psychosocial/spiritual support  HPI/Patient Profile: 48 y.o. 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  His oncologist is Dr Irene Limbo.  He was admitted to the hospital for persistent nausea and vomiting and poor p.o. intake. Per his wife he has had significant physical and functional decline over the past few months.  Currently he is intermittently confused.  He has had significant abdominal ascites requiring frequent paracentesis for large volume fluid removal.  Today he anticipates another paracentesis and since a tunneled cath was planned for January 27 family is hopeful that tunneled cath can be placed on next paracentesis.  This was discussed with attending.  Oncology has had conversation with the patient and his wife regarding poor prognosis.  IMPRESSION:   CT scan from 08-29-18 1. 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 2. Scattered liver metastases are mildly increased from 06/12/2018 CT, unchanged from 08/18/2018 CT. 3. Stable mild porta hepatis adenopathy. 4. Small dependent bilateral pleural effusions, increased bilaterally. Stable mild right lung base pleural nodularity. 5. Stable malignant appearing subcutaneous nodularity associated with  the midline laparotomy scar in the ventral abdomen. 6. Stable well-positioned patent CBD stent. 7. No evidence of bowel obstruction or acute bowel inflammation. 8. Nonobstructing left nephrolithiasis. 9.  Aortic Atherosclerosis (ICD10-I70.0).  Patient has been seen at home by community based palliative care services.  Patient and his wife face treatment option decisions, advanced directive decisions and anticipatory care needs.  Clinical Assessment and Goals of Care:  This NP Wadie Lessen reviewed medical records, received report from team, assessed the patient and then meet at the patient's bedside along with his wife/Scott Gallagher  to discuss diagnosis, prognosis, GOC, EOL wishes disposition and options.  Concept of Hospice and Palliative Care were discussed  A detailed discussion was had today regarding advanced directives.  Concepts specific to code status, artifical feeding and hydration, continued IV antibiotics and rehospitalization was had.  The difference between a aggressive medical intervention path  and a palliative comfort care path for this patient at this time was had.  Values and goals of care important to patient and family were attempted to be elicited.  Placed  call to oncology outpatient office to inform Dr. Irene Limbo the patient is hospitalized and requesting a visit. Wife is questioning prognosis and role of any further chemotherapy in the  prolongation of quality of life  Questions and concerns addressed.     PMT will continue to support holistically.  SUMMARY OF  RECOMMENDATIONS    Code Status/Advance Care Planning:  DNR-documented today.  Wife supports decision   Symptom Management:   Pain: Continue fentanyl patch 75 mcg            DC oral agents patient having significant nausea and vomiting            Dilaudid 0.5 mg IV every 3 hours as needed for pain  Evaluate for efficacy in the morning  Palliative Prophylaxis:   Aspiration, Bowel Regimen, Delirium  Protocol, Frequent Pain Assessment and Oral Care  Additional Recommendations (Limitations, Scope, Preferences):  Full Scope Treatment  Psycho-social/Spiritual:   Desire for further Chaplaincy support:no  Additional Recommendations: Education on Hospice  Prognosis:   < 3 months  Discharge Planning: To Be Determined      Primary Diagnoses: Present on Admission: . OSA (obstructive sleep apnea) . Hyperlipidemia . Metastatic colon cancer to liver ([place]) . Physical deconditioning . Nausea & vomiting . Pneumonia . Volume depletion . Hypokalemia . Nausea and vomiting   I have reviewed the medical record, interviewed the patient and family, and examined the patient. The following aspects are pertinent.  Past Medical History:  Diagnosis Date  . Anemia   . Arthritis   . Colon cancer ([place]) dx'd 05/2016  . Diabetes mellitus without complication ([facility])    diet controlled  . History of blood transfusion   . Hyperlipidemia   . liver mets dx'd 05/2016  . Sleep apnea    cpap  . Wears glasses    Social History   Socioeconomic History  . Marital status: Married    Spouse name: Not on file  . Number of children: Not on file  . Years of education: Not on file  . Highest education level: Not on file  Occupational History  . Occupation: cemetery maintenance  Social Needs  . Financial resource strain: Not hard at all  . Food insecurity:    Worry: Patient refused    Inability: Patient refused  . Transportation needs:    Medical: Patient refused    Non-medical: Patient refused  Tobacco Use  . Smoking status: Former Smoker    Packs/day: 1.50    Years: 29.00    Pack years: 43.50    Types: Cigarettes    Last attempt to quit: 12/16/2012    Years since quitting: 5.7  . Smokeless tobacco: Never Used  . Tobacco comment: 1-2 ppd from age [age]y to 2014  Substance and Sexual Activity  . Alcohol use: Yes    Alcohol/week: 3.0 standard drinks    Types: 3 Shots of liquor per week      Comment:  some days  . Drug use: Yes    Types: Marijuana  . Sexual activity: Not Currently    Birth control/protection: None  Lifestyle  . Physical activity:    Days per week: Patient refused    Minutes per session: Patient refused  . Stress: Only a little  Relationships  . Social connections:    Talks on phone: Patient refused    Gets together: Patient refused    Attends religious service: Patient refused    Active member of club or organization: Patient refused    Attends meetings of clubs or organizations: Patient refused    Relationship status: Patient refused  Other Topics Concern  . Not on file  Social History Narrative  . Not on file   Family History  Problem Relation Age of Onset  . Diabetes Other   . Hyperlipidemia Other   .  Hypertension Other   . Breast cancer Maternal Aunt 70  . Prostate cancer Maternal Uncle 75  . Lung cancer Paternal Uncle 62  . Stroke Maternal Grandfather 72  . Cancer Paternal Grandmother 29       dx cancer of pancreas and colon, unknown if separate primaries  . Heart Problems Paternal Grandfather        d. 80  . Prostate cancer Maternal Uncle 19  . Breast cancer Maternal Aunt 75  . Breast cancer Maternal Aunt 68  . Cervical cancer Maternal Aunt 68  . Pancreatic cancer Maternal Aunt 54       d. 58y; heavy smoker  . Colon cancer Paternal Uncle 81       s/p partial colectomy; dx. second colon cancer at age 59-64   Scheduled Meds: . dronabinol  5 mg Oral BID AC  . enoxaparin (LOVENOX) injection  40 mg Subcutaneous Q24H  . feeding supplement  1 Container Oral TID BM  . fentaNYL  1 patch Transdermal Q72H  . insulin aspart  0-9 Units Subcutaneous TID WC  . lidocaine      . multivitamin with minerals  1 tablet Oral Daily  . scopolamine  1 patch Transdermal Q72H  . senna-docusate  2 tablet Oral BID  . sodium chloride flush  3 mL Intravenous Once   Continuous Infusions: . 0.45 % NaCl with KCl 20 mEq / L 100 mL/hr at 09/05/18 0600  .  sodium chloride 1,000 mL (09/04/18 1310)  . albumin human    . azithromycin 500 mg (09/04/18 1607)  . cefTRIAXone (ROCEPHIN)  IV    . chlorproMAZINE (THORAZINE) IV 25 mg (09/05/18 0024)   PRN Meds:.sodium chloride, acetaminophen **OR** acetaminophen, albuterol, chlorproMAZINE (THORAZINE) IV, HYDROmorphone (DILAUDID) injection, loperamide, LORazepam, ondansetron (ZOFRAN) IV, polyethylene glycol, promethazine Medications Prior to Admission:  Prior to Admission medications   Medication Sig Start Date End Date Taking? Authorizing Provider  dronabinol (MARINOL) 5 MG capsule Take 1 capsule (5 mg total) by mouth 2 (two) times daily before a meal. 07/21/18  Yes Kale, Cloria Spring, MD  feeding supplement, ENSURE ENLIVE, (ENSURE ENLIVE) LIQD Take 237 mLs by mouth 2 (two) times daily between meals. 08/20/18  Yes Florencia Reasons, MD  LORazepam (ATIVAN) 0.5 MG tablet Take 1-2 tablets (0.5-1 mg total) by mouth every 8 (eight) hours as needed for anxiety (nausea). 08/04/18  Yes Brunetta Genera, MD  Multiple Vitamin (MULTIVITAMIN WITH MINERALS) TABS tablet Take 1 tablet by mouth daily. 08/21/18  Yes Florencia Reasons, MD  ondansetron (ZOFRAN) 8 MG tablet Take 1 tablet (8 mg total) by mouth 2 (two) times daily as needed for refractory nausea / vomiting. Start on day 3 after chemotherapy. 06/22/18  Yes Brunetta Genera, MD  Oxycodone HCl 10 MG TABS Take 1-2 tablets (10-20 mg total) by mouth every 4 (four) hours as needed. 09/02/18  Yes Brunetta Genera, MD  prochlorperazine (COMPAZINE) 10 MG tablet TAKE 1 TABLET BY MOUTH EVERY 6 HOURS AS NEEDED FOR NAUSEA / VOMITING Patient taking differently: Take 10 mg by mouth every 6 (six) hours as needed for nausea or vomiting. TAKE 1 TABLET BY MOUTH EVERY 6 HOURS AS NEEDED FOR NAUSEA / VOMITING 08/04/18  Yes Brunetta Genera, MD  promethazine (PHENERGAN) 25 MG tablet Take 25 mg by mouth every 6 (six) hours as needed for nausea or vomiting.  08/29/18  Yes [provider]    senna-docusate (SENNA S) 8.6-50 MG tablet Take 2 tablets by mouth 2 (two)  times daily. 08/04/18  Yes Brunetta Genera, MD  Albuterol Sulfate (PROAIR RESPICLICK) 100 (90 Base) MCG/ACT AEPB Inhale 2 puffs into the lungs every 4 (four) hours as needed (cough and wheezing). 08/24/18   Margarita Mail, PA-C  fentaNYL (DURAGESIC) 75 MCG/HR Place 1 patch onto the skin every 3 (three) days. 09/02/18   Brunetta Genera, MD  loperamide (IMODIUM A-D) 2 MG tablet Take 1-2 tablets (2-4 mg total) by mouth 4 (four) times daily as needed for diarrhea or loose stools. 06/22/18   Brunetta Genera, MD  ondansetron (ZOFRAN ODT) 4 MG disintegrating tablet Take 1 tablet (4 mg total) by mouth every 8 (eight) hours as needed for nausea or vomiting. Patient not taking: Reported on 09/04/2018 05/08/18   Montine Circle, PA-C  polyethylene glycol [facility]) packet Take 17 g by mouth daily. Patient taking differently: Take 17 g by mouth daily as needed for mild constipation.  08/20/18   Florencia Reasons, MD   Allergies  Allergen Reactions  . Penicillins     Was told as a child he was allergic does not know of type of reaction.   Has patient had a PCN reaction causing immediate rash, facial/tongue/throat swelling, SOB or lightheadedness with hypotension: N Has patient had a PCN reaction causing severe rash involving mucus membranes or skin necrosis: N Has patient had a PCN reaction that required hospitalization: N Has patient had a PCN reaction occurring within the last 10 years: N If all of the above answers are "NO", then may proceed with Cephalospo   Review of Systems  Gastrointestinal: Positive for abdominal distention and abdominal pain.  Neurological: Positive for weakness.  Psychiatric/Behavioral: Positive for confusion.    Physical Exam Constitutional:      Appearance: He is cachectic.  Cardiovascular:     Rate and Rhythm: Tachycardia present.  Pulmonary:     Breath sounds: Decreased breath sounds present.   Skin:    General: Skin is warm and dry.  Neurological:     Mental Status: He is lethargic.     Motor: Weakness present.     Vital Signs: BP (!) 125/91 (BP Location: Left Arm)   Pulse (!) 127   Temp 98.2 F (36.8 C) (Oral)   Resp 18   Ht 6\' 2"  (1.88 m)   Wt 91.3 kg   SpO2 97%   BMI 25.84 kg/m  Pain Scale: 0-10   Pain Score: 6    SpO2: SpO2: 97 % O2 Device:SpO2: 97 % O2 Flow Rate: .   IO: Intake/output summary:   Intake/Output Summary (Last 24 hours) at 09/05/2018 1321 Last data filed at 09/05/2018 0800 Gross per 24 hour  Intake 1275 ml  Output 550 ml  Net 725 ml    LBM: Last BM Date: 09/03/18 Baseline Weight: Weight: 96.6 kg Most recent weight: Weight: 91.3 kg     Palliative Assessment/Data:  40%    Discussed with Dr Wyline Copas Time In: 1200 Time Out: 1315 Time Total: 75 minutes Greater than 50%  of this time was spent counseling and coordinating care related to the above assessment and plan.  Signed by: Wadie Lessen, NP   Please contact Palliative Medicine Team phone at [phone] for questions and concerns.  For individual provider: See Shea Evans

## 2018-09-05 NOTE — Procedures (Signed)
Ultrasound-guided diagnostic and therapeutic paracentesis performed yielding 1.4 liters of yellow fluid. No immediate complications. EBL< 1 cc.  A portion of the fluid was submitted to the lab for preordered studies.  In order for patient to be appropriate candidate for tunneled peritoneal drain he has to have significant amount of ascites present with no active infections.  Above discussed with primary care team.

## 2018-09-05 NOTE — Progress Notes (Signed)
Thorazine given  For hiccups

## 2018-09-06 LAB — GLUCOSE, CAPILLARY
Glucose-Capillary: 119 mg/dL — ABNORMAL HIGH (ref 70–99)
Glucose-Capillary: 177 mg/dL — ABNORMAL HIGH (ref 70–99)

## 2018-09-06 LAB — COMPREHENSIVE METABOLIC PANEL
ALT: 15 U/L (ref 0–44)
AST: 19 U/L (ref 15–41)
Albumin: 3.4 g/dL — ABNORMAL LOW (ref 3.5–5.0)
Alkaline Phosphatase: 97 U/L (ref 38–126)
Anion gap: 13 (ref 5–15)
BUN: 23 mg/dL — ABNORMAL HIGH (ref 6–20)
CO2: 29 mmol/L (ref 22–32)
CREATININE: 0.82 mg/dL (ref 0.61–1.24)
Calcium: 8.3 mg/dL — ABNORMAL LOW (ref 8.9–10.3)
Chloride: 91 mmol/L — ABNORMAL LOW (ref 98–111)
GFR calc Af Amer: >60 mL/min (ref >60)
GFR calc non Af Amer: >60 mL/min (ref >60)
Glucose, Bld: 109 mg/dL — ABNORMAL HIGH (ref 70–99)
Potassium: 3.3 mmol/L — ABNORMAL LOW (ref 3.5–5.1)
Sodium: 133 mmol/L — ABNORMAL LOW (ref 135–145)
Total Bilirubin: 1.7 mg/dL — ABNORMAL HIGH (ref 0.3–1.2)
Total Protein: 6.4 g/dL — ABNORMAL LOW (ref 6.5–8.1)

## 2018-09-06 LAB — GRAM STAIN

## 2018-09-06 MED ORDER — PROCHLORPERAZINE EDISYLATE 10 MG/2ML IJ SOLN
10.0000 mg | INTRAMUSCULAR | Status: DC | PRN
  Administered 2018-09-06 – 2018-09-07 (×3): 10 mg via INTRAVENOUS
  Filled 2018-09-06 (×3): qty 2

## 2018-09-06 MED ORDER — SODIUM CHLORIDE 0.9 % IV SOLN
1.0000 g | INTRAVENOUS | Status: DC
  Administered 2018-09-06: 1 g via INTRAVENOUS
  Filled 2018-09-06: qty 1

## 2018-09-06 NOTE — Progress Notes (Signed)
PROGRESS NOTE    Scott Gallagher  BJS:[id number] DOB: 07/25/1971 DOA: 09/04/2018 PCP: [name], MD    Brief Narrative:  48 y.o. 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  He does have history of metastatic colon cancer to liver and is on chemotherapy by oncology as outpatient.  He states that his nausea and vomiting is worsening last 12 hours or so and there is no obvious aggravating or relieving factors.  No hematemesis or melena.  Denies any abdominal pain.  He has been having constant nausea and hiccups.  He did have 3-4 episodes of vomiting yesterday followed by fever chills today.  Patient has abdominal discomfort largely from ascites.  Patient states that he has had ascites tapped at least 2 times last one was last week with 5 L fluid removal.  There was some discussion regarding peritoneal catheter for long-term relief of ascites.  Patient states that he has not been able to lie down largely because of ascites at home.  He however denies any cough, runny nose, fever or chills.  Patient denies any urinary urgency, frequency or dysuria.  He has impaired appetite.  Denies any dizziness, syncope headache.  Penicillin is listed in the computer as allergy but he has received penicillin in the past without any problem.  ED Course: In the ED, patient was also noted to be mildly hypoxic.  Chest x-ray done in the ED showed new infiltrate on the left lung.  Had mild leukocytosis as well.  Patient was then considered for admission to the hospital for persistent nausea, vomiting with possible left sided pneumonia.  Assessment & Plan:   Principal Problem:   Pneumonia Active Problems:   Diabetes mellitus type 2, diet-controlled (HCC)   OSA  (obstructive sleep apnea)   Hyperlipidemia   Metastatic colon cancer to liver [facility])   Counseling regarding advance care planning and goals of care   Nausea & vomiting   Physical deconditioning   Volume depletion   Hypokalemia   Nausea and vomiting   Palliative care by specialist   DNR (do not resuscitate)   Cancer associated pain  Mild cough, hypoxia with left-sided infiltrate possibly representing pneumonia. Continue on oxygen, nebulizers and antibiotics with Rocephin and Zithromax.  Pending blood cx. Fluid analysis not consistent with SBP  Nausea, vomiting inability to keep anything down.  Likely secondary to chemotherapy.  Cont PO as tolerated. Started scopolamine. Continue with anti-emetics as tolerated  Volume depletion.  Likely secondary to nausea vomiting.  Improved with IVF hydration. Focus on comfort  Mild hypokalemia.  Replaced. Focus on comfort. Will not repeat blood work  History of obstructive sleep apnea -not on oxygen or CPAP at home.  Metastatic colon cancer to liver and peritoneal bleed with recurrent ascites. Pt noted to have failed chemo twice prior to admit with consideration for immunotherapy. Family at bedside and have asked about whether or not to proceed with tx or to focus on hospice/comfort. Pt and family are interested in discussing goals of care with Palliative Care. Consulted Palliative Care service. -Appreciate input by Palliative Care. Prognosis likely less than 2 weeks. On discussion with Oncology, family has declined further treatment -Plan for transfer to residential hospice, awaiting bed  Diet controlled diabetes mellitus type 2. No more finger sticks. Comfort  Weakness, deconditioning  secondary to cancer, chemotherapy volume depletion.  Focus on comfort from this point   DVT prophylaxis: Lovenox subQ Code Status: DNR Family Communication: Pt in room, family at bedside Disposition Plan: Uncertain at this time  Consultants:    Palliative Care  Procedures:   US guided paracentesis 1/20  Antimicrobials: Anti-infectives (From admission, onward)   Start     Dose/Rate Route Frequency Ordered Stop   09/06/18 1800  cefTRIAXone (ROCEPHIN) 1 g in sodium chloride 0.9 % 100 mL IVPB     1 g 200 mL/hr over 30 Minutes Intravenous Every 24 hours 09/06/18 0750 09/09/18 1759   09/05/18 1800  cefTRIAXone (ROCEPHIN) 2 g in sodium chloride 0.9 % 100 mL IVPB  Status:  Discontinued     2 g 200 mL/hr over 30 Minutes Intravenous Every 24 hours 09/05/18 1019 09/06/18 0750   09/04/18 1800  cefTRIAXone (ROCEPHIN) 1 g in sodium chloride 0.9 % 100 mL IVPB  Status:  Discontinued     1 g 200 mL/hr over 30 Minutes Intravenous Every 24 hours 09/04/18 1510 09/05/18 1019   09/04/18 1600  azithromycin (ZITHROMAX) 500 mg in sodium chloride 0.9 % 250 mL IVPB     500 mg 250 mL/hr over 60 Minutes Intravenous Every 24 hours 09/04/18 1510 09/09/18 1559   09/04/18 1300  vancomycin (VANCOCIN) IVPB 1000 mg/200 mL premix     1,000 mg 200 mL/hr over 60 Minutes Intravenous  Once 09/04/18 1248 09/04/18 1508   09/04/18 1300  piperacillin-tazobactam (ZOSYN) IVPB 3.375 g     3.375 g 12.5 mL/hr over 240 Minutes Intravenous  Once 09/04/18 1248 09/04/18 1407      Subjective: Reports feeling better today  Objective: Vitals:   09/05/18 1512 09/05/18 2200 09/06/18 0530 09/06/18 1417  BP: (!) 136/92 136/89 (!) 139/95 (!) 127/91  Pulse: (!) 109 (!) 117 (!) 116 (!) 122  Resp: 18 18 18 18   Temp: 98.2 F (36.8 C) 98.5 F (36.9 C) 98.5 F (36.9 C) 97.9 F (36.6 C)  TempSrc: Oral Oral Oral Oral  SpO2: 95% 95% 97% 96%  Weight:      Height:        Intake/Output Summary (Last 24 hours) at 09/06/2018 1600 Last data filed at 09/06/2018 1230 Gross per 24 hour  Intake 60 ml  Output -  Net 60 ml   Filed Weights   09/04/18 1033 09/05/18 0549  Weight: 96.6 kg 91.3 kg    Examination: General exam: Conversant, in no acute distress Respiratory  system: normal chest rise, clear, no audible wheezing Cardiovascular system: regular rhythm, s1-s2 Gastrointestinal system: Distended, pos BS Central nervous system: No seizures, no tremors Extremities: No cyanosis, no joint deformities Skin: No rashes, no pallor Psychiatry: Affect normal // no auditory hallucinations    Data Reviewed: I have personally reviewed following labs and imaging studies  CBC: Recent Labs  Lab 09/01/18 0910 09/04/18 1105 09/05/18 0445  WBC 12.7* 12.6* 8.1  NEUTROABS 10.2* 11.4*  --   HGB 11.4* 12.6* 11.5*  HCT 37.8* 42.1 38.6*  MCV 82.9 84.0 83.7  PLT 346 482* 595   Basic Metabolic Panel: Recent Labs  Lab 09/01/18 0910 09/04/18 1105 09/05/18 0445 09/06/18 0553  NA 137 135 137 133*  K 4.2 3.4* 3.4* 3.3*  CL 96* 92* 93* 91*  CO2 30 30 29 29   GLUCOSE 126* 142* 159* 109*  BUN 12 20 23* 23*  CREATININE 0.81 0.83 1.02 0.82  CALCIUM 8.7* 8.9 8.7* 8.3*  MG  --   --  2.5*  --    GFR: Estimated Creatinine Clearance: 128.1 mL/min (by C-G formula based on SCr of 0.82 mg/dL). Liver Function Tests: Recent Labs  Lab 09/01/18 0910 09/04/18 1105 09/06/18 0553  AST 21 21 19   ALT 7 15 15   ALKPHOS 206* 126 97  BILITOT 1.0 0.9 1.7*  PROT 6.2* 6.6 6.4*  ALBUMIN 2.8* 3.2* 3.4*   No results for input(s): LIPASE, AMYLASE in the last 168 hours. No results for input(s): AMMONIA in the last 168 hours. Coagulation Profile: Recent Labs  Lab 09/04/18 1105  INR 1.07   Cardiac Enzymes: No results for input(s): CKTOTAL, CKMB, CKMBINDEX, TROPONINI in the last 168 hours. BNP (last 3 results) No results for input(s): PROBNP in the last 8760 hours. HbA1C: No results for input(s): HGBA1C in the last 72 hours. CBG: Recent Labs  Lab 09/05/18 1244 09/05/18 1727 09/05/18 2140 09/06/18 0722 09/06/18 1157  GLUCAP 142* 153* 125* 119* 177*   Lipid Profile: No results for input(s): CHOL, HDL, LDLCALC, TRIG, CHOLHDL, LDLDIRECT in the last 72 hours. Thyroid  Function Tests: No results for input(s): TSH, T4TOTAL, FREET4, T3FREE, THYROIDAB in the last 72 hours. Anemia Panel: No results for input(s): VITAMINB12, FOLATE, FERRITIN, TIBC, IRON, RETICCTPCT in the last 72 hours. Sepsis Labs: Recent Labs  Lab 09/04/18 1104 09/04/18 1319  LATICACIDVEN 1.96* 1.29    Recent Results (from the past 240 hour(s))  Body fluid culture     Status: None   Collection Time: 08/29/18  2:57 PM  Result Value Ref Range Status   Specimen Description   Final    PERITONEAL CAVITY Performed at [facility] [address], [place], [place] [address]    Special Requests   Final    Immunocompromised Performed at [facility], [place] [address], [place], [place] [address]    Gram Stain   Final    RARE WBC PRESENT, PREDOMINANTLY MONONUCLEAR NO ORGANISMS SEEN    Culture   Final    NO GROWTH 3 DAYS Performed at [facility], [place] [address], [place], [place] [address]    Report Status 09/02/2018 FINAL  Final  Culture, blood (Routine x 2)     Status: None (Preliminary result)   Collection Time: 09/04/18 11:05 AM  Result Value Ref Range Status   Specimen Description   Final    RIGHT ANTECUBITAL Performed at [facility] [address], [place], [place] [address]    Special Requests   Final    BOTTLES DRAWN AEROBIC AND ANAEROBIC Blood Culture results may not be optimal due to an excessive volume of blood received in culture bottles Performed at [facility] [address], [place], [place] [address]    Culture   Final    NO GROWTH 2 DAYS Performed at [facility] [address], [place], [place] [address]    Report Status PENDING  Incomplete  Culture, blood (Routine x 2)     Status: None (Preliminary result)   Collection Time: 09/04/18 11:16 AM  Result Value Ref Range Status   Specimen Description   Final    BLOOD RIGHT CHEST Performed at [facility] [address], [place], [place] [address]    Special Requests   Final    BOTTLES DRAWN AEROBIC AND ANAEROBIC Blood Culture adequate volume Performed at [facility] [address], [place], [place] [address]    Culture   Final  NO GROWTH 2 DAYS Performed at [facility], [place] [address] [place], [place] [address]    Report Status PENDING  Incomplete  Culture, body fluid-bottle     Status: None (Preliminary result)   Collection Time: 09/05/18  2:00 PM  Result Value Ref Range Status   Specimen Description PERITONEAL  Final   Special Requests BOTTLES DRAWN AEROBIC AND ANAEROBIC  Final   Culture   Final    NO GROWTH < 24 HOURS Performed at [facility], [place] [address], [place], [place] [address]    Report Status PENDING  Incomplete  Gram stain     Status: None   Collection Time: 09/05/18  2:00 PM  Result Value Ref Range Status   Specimen Description PERITONEAL  Final   Special Requests   Final    NONE Performed at [facility], [place] [address], [place], [place] [address]    Gram Stain   Final    WBC PRESENT,BOTH PMN AND MONONUCLEAR NO ORGANISMS SEEN CYTOSPIN SMEAR    Report Status 09/06/2018 FINAL  Final     Radiology Studies: US Paracentesis  Result Date: 09/05/2018 INDICATION: Patient with history of metastatic colon cancer, recurrent ascites. Request made for diagnostic and therapeutic paracentesis. EXAM: ULTRASOUND GUIDED DIAGNOSTIC AND THERAPEUTIC PARACENTESIS MEDICATIONS: None COMPLICATIONS: None immediate. PROCEDURE: Informed written consent was obtained from the patient after a discussion of the risks, benefits and alternatives to treatment. A timeout was performed prior to the initiation of the procedure. Initial ultrasound scanning demonstrates a small amount of ascites within the left mid to lower abdominal quadrant. The left mid to lower abdomen was prepped and draped in the usual sterile fashion. 1% lidocaine was used for local  anesthesia. Following this, a 19 gauge, 7-cm, Yueh catheter was introduced. An ultrasound image was saved for documentation purposes. The paracentesis was performed. The catheter was removed and a dressing was applied. The patient tolerated the procedure well without immediate post procedural complication. FINDINGS: A total of approximately 1.4 liters of yellow fluid was removed. Samples were sent to the laboratory as requested by the clinical team. IMPRESSION: Successful ultrasound-guided diagnostic and therapeutic paracentesis yielding 1.4 liters of peritoneal fluid. Read by: Rowe Robert, PA-C Electronically Signed   By: Marybelle Killings M.D.   On: 09/05/2018 14:28    Scheduled Meds: . feeding supplement  1 Container Oral TID BM  . fentaNYL  1 patch Transdermal Q72H  . scopolamine  1 patch Transdermal Q72H  . senna-docusate  2 tablet Oral BID  . sodium chloride flush  3 mL Intravenous Once   Continuous Infusions: . 0.45 % NaCl with KCl 20 mEq / L 50 mL/hr at 09/06/18 1530  . sodium chloride 1,000 mL (09/04/18 1310)  . azithromycin 500 mg (09/06/18 1540)  . cefTRIAXone (ROCEPHIN)  IV    . chlorproMAZINE (THORAZINE) IV 25 mg (09/05/18 1555)     LOS: 2 days   Marylu Lund, MD [facility] Pager On Amion  If 7PM-7AM, please contact night-coverage 09/06/2018, 4:00 PM

## 2018-09-06 NOTE — Progress Notes (Addendum)
Patient ID: Scott Gallagher, male   DOB: Jan 25, 1971, 48 y.o.   MRN: [id number]  This NP visited patient at the bedside as a follow up to  yesterday's [place] meeting for continued conversation regarding discussion regarding current medical situation and goals of care,  end-of-life wishes, disposition and options.      Wife and patient's mother at bedside.  Wife understands the seriousness of the patient's current medical situation and limited prognosis.  Verbalizes her inability to care for him at home and her limited support, and her belief that he will be safer and well cared for at a residential hospice.  Plan of Care: Focus of care is comfort, quality  and dignity  - DNR/DNI - no further treatment for cancer diagnosis - once discharged from hospital  no further, diagnostics, iv fluids, antibiotic use - symptom management to enhance comfort- see orders/ heavy symptom burden - hopeful for residential hospice for EOL care - prognosis is likely less than two weeks   Discussed  natural trajectory and expectations at end of life. Questions and concerns addressed     Discussed with Dr Wyline Copas  Total time spent on the unit was 45 minutes  Greater than 50% of the time was spent in counseling and coordination of care  Wadie Lessen NP  Palliative Medicine Team Team Phone # [phone] Pager [phone]

## 2018-09-06 NOTE — Progress Notes (Signed)
A soap  suds  Enema was given this afternoon. Small  Frequent loose brown stools. X 6

## 2018-09-07 ENCOUNTER — Inpatient Hospital Stay: Payer: 59 | Admitting: Hematology

## 2018-09-07 ENCOUNTER — Inpatient Hospital Stay: Payer: 59

## 2018-09-07 DIAGNOSIS — E119 Type 2 diabetes mellitus without complications: Secondary | ICD-10-CM

## 2018-09-07 DIAGNOSIS — J189 Pneumonia, unspecified organism: Secondary | ICD-10-CM

## 2018-09-07 MED ORDER — HYDROMORPHONE HCL 1 MG/ML IJ SOLN: 1.0000 mg | INTRAMUSCULAR | Status: DC | PRN

## 2018-09-07 MED ORDER — HYDROMORPHONE HCL 1 MG/ML IJ SOLN
1.0000 mg | INTRAMUSCULAR | Status: DC | PRN
  Administered 2018-09-07 – 2018-09-08 (×4): 1 mg via INTRAVENOUS
  Filled 2018-09-07 (×3): qty 1

## 2018-09-07 MED ORDER — LORAZEPAM 2 MG/ML IJ SOLN
1.0000 mg | INTRAMUSCULAR | Status: DC | PRN
  Administered 2018-09-07 (×2): 1 mg via INTRAVENOUS
  Filled 2018-09-07 (×2): qty 1

## 2018-09-07 MED ORDER — HYDROMORPHONE HCL 1 MG/ML IJ SOLN
1.0000 mg | INTRAMUSCULAR | Status: DC | PRN
  Filled 2018-09-07: qty 1

## 2018-09-07 MED ORDER — LORAZEPAM 1 MG PO TABS
1.0000 mg | ORAL_TABLET | ORAL | Status: DC | PRN
  Administered 2018-09-07: 1 mg via ORAL
  Filled 2018-09-07: qty 1

## 2018-09-07 NOTE — Progress Notes (Signed)
MEWS Guidelines - (patients age 49 and over)  Red - At High Risk for Deterioration Yellow - At risk for Deterioration  1. Go to room and assess patient 2. Validate data. Is this patient's baseline? If data confirmed: 3. Is this an acute change? 4. Administer prn meds/treatments as ordered. 5. Note Sepsis score 6. Review goals of care 7. Sports coach, RRT nurse and Provider. 8. Ask Provider to come to bedside.  9. Document patient condition/interventions/response. 10. Increase frequency of vital signs and focused assessments to at least q15 minutes x 4, then q30 minutes x2. - If stable, then q1h x3, then q4h x3 and then q8h or dept. routine. - If unstable, contact Provider & RRT nurse. Prepare for possible transfer. 11. Add entry in progress notes using the smart phrase ".MEWS". 1. Go to room and assess patient 2. Validate data. Is this patient's baseline? If data confirmed: 3. Is this an acute change? 4. Administer prn meds/treatments as ordered? 5. Note Sepsis score 6. Review goals of care 7. Sports coach and Provider 8. Call RRT nurse as needed. 9. Document patient condition/interventions/response. 10. Increase frequency of vital signs and focused assessments to at least q2h x2. - If stable, then q4h x2 and then q8h or dept. routine. - If unstable, contact Provider & RRT nurse. Prepare for possible transfer. 11. Add entry in progress notes using the smart phrase ".MEWS".  Green - Likely stable Lavender - Comfort Care Only  1. Continue routine/ordered monitoring.  2. Review goals of care. 1. Continue routine/ordered monitoring. 2. Review goals of care.    Verified accuracy of HR. Pt's baseline and goals of care are comfort oriented. No acute distress. No c/o pain. Will continue to monitor.

## 2018-09-07 NOTE — Progress Notes (Signed)
[facility]) hospital liaison note.  Received request from [place] for family interest in [facility]. Chart reviewed eligibility confirmed.  Patient can transfer on 09/08/2018 after paperwork is completed. Hospital Liaison will notify CSW when this is done.  Please do not hesitate to call with questions.  Thank you,  Farrel Gordon, RN, [facility] Liaison  [place] are on AMION

## 2018-09-07 NOTE — Progress Notes (Addendum)
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 He does have history of metastatic colon cancer to liver and is on chemotherapy by oncology as outpatient. He states that his nausea and vomiting is worsening last 12 hours or so and there is no obvious aggravating or relieving factors. No hematemesis or melena. Denies any abdominal pain. He has been having constant nausea and hiccups. He did have 3-4 episodes of vomiting yesterday followed by fever chills today. Patient has abdominal discomfort largely from ascites. Patient states that he has had ascites tapped at least 2 times last one was last week with 5 L fluid removal. There was some discussion regarding peritoneal catheter for long-term relief of ascites. Patient states that he has not been able to lie down largely because of ascites at home. He however denies any cough, runny nose, fever or chills. Patient denies any urinary urgency, frequency or dysuria. He has impaired appetite. Denies any dizziness, syncope headache. Penicillin is listed in the computer as allergy but he has received penicillin in the past without any problem.  In the ED, patient was also noted to be mildly hypoxic. Chest x-ray done in the ED showed new infiltrate on the left lung. Had mild leukocytosis as well. Patient was then considered for admission to the hospital for persistent nausea, vomiting with possible left sided pneumonia.  09/07/2018: Patient seen and examined with his wife and mother at bedside.  Reported nausea this morning.  Palliative care team following  with ongoing adjustment of medications for nausea and pain.  Possible discharge to residency hospice once a bed is available.  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  Highly appreciated  Intractable nausea, vomiting, and pain Suspect related to his advanced cancer Symptomatic management by palliative care team.  Highly appreciated  Impaired glucose tolerance Last hemoglobin A1c 5.7 Diet controlled Avoid hypoglycemia  Physical debility in the setting of metastasized stage IV colon cancer Poor prognosis Patient family moving towards comfort care only Possible discharge to hospice residency when bed becomes available Ambulate as tolerated with fall precautions  Presumed community-acquired pneumonia Presented with leukocytosis and tachycardia with chest x-ray suspicion for left lingular infiltrate versus atelectasis Independently reviewed chest x-ray Received 4 days of IV antibiotics empirically IV antibiotics stopped on 09/07/2018 Afebrile Wife declines further work-up or testing Moving towards comfort care only  Recent  hypokalemia Last  potassium 3.3 on 09/06/2018 No further work-up per wife, moving towards comfort care only  Goals of care Poor prognosis due to advanced metastatic colon cancer, recurrent ascites, poor functional status.  Patient's family wife and mother in the room made decision to transition to comfort care only.  Awaiting bed placement at residential hospice.   DVT prophylaxis: Lovenox subQ Code Status: DNR Family Communication:  Updated wife and mother in the room.  All questions answered to their satisfaction. Disposition Plan:  DC to residency hospice when bed becomes available.  Consultants:   Palliative Care  Procedures:   US guided paracentesis 09/05/18    Objective: Vitals:   09/06/18 0530 09/06/18 1417 09/06/18 2117 09/07/18 0556  BP: (!) 139/95 (!) 127/91 127/87 (!) 131/92  Pulse: (!) 116 (!) 122 (!) 139 (!) 118  Resp: 18 18 17 16   Temp: 98.5 F (36.9 C) 97.9 F (36.6 C) 98 F (36.7 C) 98 F (36.7 C)  TempSrc: Oral Oral Oral Oral  SpO2: 97% 96% 94% 94%  Weight:      Height:        Intake/Output Summary (Last 24 hours) at 09/07/2018 1018 Last data filed at 09/07/2018 0800 Gross per 24 hour  Intake 4999.97 ml  Output -  Net 4999.97 ml   Filed Weights   09/04/18 1033 09/05/18 0549  Weight: 96.6 kg 91.3 kg    Exam:  . General: 48 y.o. year-old male well developed well nourished in no acute distress.  Somnolent but easily arouses to voices. . Cardiovascular: Regular rate and rhythm with no rubs or gallops.  No thyromegaly or JVD noted.   [name] Respiratory: Clear to auscultation with no wheezes or rales. Good inspiratory effort. . Abdomen: Mildly distended with hypoactive bowel sounds. . Musculoskeletal: Trace lower extremity edema. 2/4 pulses in all 4 extremities. [name] Psychiatry: Mood is appropriate for condition and setting   Data Reviewed: CBC: Recent Labs  Lab 09/01/18 0910 09/04/18 1105 09/05/18 0445  WBC 12.7* 12.6* 8.1  NEUTROABS 10.2* 11.4*  --   HGB 11.4*  12.6* 11.5*  HCT 37.8* 42.1 38.6*  MCV 82.9 84.0 83.7  PLT 346 482* 488   Basic Metabolic Panel: Recent Labs  Lab 09/01/18 0910 09/04/18 1105 09/05/18 0445 09/06/18 0553  NA 137 135 137 133*  K 4.2 3.4* 3.4* 3.3*  CL 96* 92* 93* 91*  CO2 30 30 29 29   GLUCOSE 126* 142* 159* 109*  BUN 12 20 23* 23*  CREATININE 0.81 0.83 1.02 0.82  CALCIUM 8.7* 8.9 8.7* 8.3*  MG  --   --  2.5*  --    GFR: Estimated Creatinine Clearance: 128.1 mL/min (by C-G formula based on SCr of 0.82 mg/dL). Liver Function Tests: Recent Labs  Lab 09/01/18 0910 09/04/18 1105 09/06/18 0553  AST 21 21 19   ALT 7 15 15   ALKPHOS 206* 126 97  BILITOT 1.0 0.9 1.7*  PROT 6.2* 6.6 6.4*  ALBUMIN 2.8* 3.2* 3.4*   No results for input(s): LIPASE, AMYLASE in the last 168 hours. No results for input(s): AMMONIA in the last 168 hours. Coagulation Profile: Recent Labs  Lab 09/04/18 1105  INR 1.07   Cardiac Enzymes: No results for input(s): CKTOTAL, CKMB, CKMBINDEX, TROPONINI in the last 168 hours. BNP (last 3 results) No results for input(s): PROBNP in the last 8760 hours. HbA1C: No results for input(s): HGBA1C in the last 72 hours. CBG: Recent Labs  Lab 09/05/18 1244 09/05/18 1727 09/05/18 2140 09/06/18 8916  09/06/18 1157  GLUCAP 142* 153* 125* 119* 177*   Lipid Profile: No results for input(s): CHOL, HDL, LDLCALC, TRIG, CHOLHDL, LDLDIRECT in the last 72 hours. Thyroid Function Tests: No results for input(s): TSH, T4TOTAL, FREET4, T3FREE, THYROIDAB in the last 72 hours. Anemia Panel: No results for input(s): VITAMINB12, FOLATE, FERRITIN, TIBC, IRON, RETICCTPCT in the last 72 hours. Urine analysis:    Component Value Date/Time   COLORURINE YELLOW 09/04/2018 1039   APPEARANCEUR HAZY (A) 09/04/2018 1039   LABSPEC 1.024 09/04/2018 1039   PHURINE 5.0 09/04/2018 1039   GLUCOSEU NEGATIVE 09/04/2018 1039   HGBUR NEGATIVE 09/04/2018 1039   BILIRUBINUR NEGATIVE 09/04/2018 1039   KETONESUR NEGATIVE  09/04/2018 1039   PROTEINUR NEGATIVE 09/04/2018 1039   NITRITE NEGATIVE 09/04/2018 1039   LEUKOCYTESUR NEGATIVE 09/04/2018 1039   Sepsis Labs: @LABRCNTIP (procalcitonin:4,lacticidven:4)  ) Recent Results (from the past 240 hour(s))  Body fluid culture     Status: None   Collection Time: 08/29/18  2:57 PM  Result Value Ref Range Status   Specimen Description   Final    PERITONEAL CAVITY Performed at [facility], [place] [address] [place], [place] [address]    Special Requests   Final    Immunocompromised Performed at [facility], [place] [address], [place], [place] [address]    Gram Stain   Final    RARE WBC PRESENT, PREDOMINANTLY MONONUCLEAR NO ORGANISMS SEEN    Culture   Final    NO GROWTH 3 DAYS Performed at [facility], [place] [address], [place], [place] [address]    Report Status 09/02/2018 FINAL  Final  Culture, blood (Routine x 2)     Status: None (Preliminary result)   Collection Time: 09/04/18 11:05 AM  Result Value Ref Range Status   Specimen Description   Final    RIGHT ANTECUBITAL Performed at [facility] [address] [place], [place] [address]    Special Requests   Final    BOTTLES DRAWN AEROBIC AND ANAEROBIC Blood Culture results may not be optimal due to an excessive volume of blood received in culture bottles Performed at [facility] [address], [place], [place] [address]    Culture   Final    NO GROWTH 3 DAYS Performed at [facility], [place] [address] [place], [place] [address]    Report Status PENDING  Incomplete  Culture, blood (Routine x 2)     Status: None (Preliminary result)   Collection Time: 09/04/18 11:16 AM  Result Value Ref Range Status   Specimen Description   Final    BLOOD RIGHT CHEST Performed at [facility] [address], [place], [place] [address]    Special Requests   Final    BOTTLES DRAWN AEROBIC AND ANAEROBIC  Blood Culture adequate volume Performed at [facility] [address], [place], [place] [address]    Culture   Final    NO GROWTH 3 DAYS Performed at [facility], [place] [address], [place],  [address]    Report Status PENDING  Incomplete  Culture, body fluid-bottle     Status: None (Preliminary result)   Collection Time: 09/05/18  2:00 PM  Result Value Ref Range Status   Specimen Description PERITONEAL  Final   Special Requests BOTTLES DRAWN AEROBIC AND ANAEROBIC  Final   Culture   Final    NO GROWTH 2 DAYS Performed at [facility],  [address], [place], [place] [address]    Report Status PENDING  Incomplete  Gram stain     Status: None   Collection Time: 09/05/18  2:00 PM  Result Value Ref Range Status   Specimen Description PERITONEAL  Final   Special Requests   Final    NONE Performed at [facility], [place] [address] [place],  [address]    Gram Stain   Final    WBC PRESENT,BOTH PMN AND MONONUCLEAR NO ORGANISMS SEEN CYTOSPIN SMEAR    Report Status 09/06/2018 FINAL  Final      Studies: No results found.  Scheduled Meds: . feeding supplement  1 Container Oral TID BM  . fentaNYL  1 patch Transdermal Q72H  . scopolamine  1 patch Transdermal Q72H  . senna-docusate  2 tablet Oral BID  . sodium chloride flush  3 mL Intravenous Once    Continuous Infusions: . 0.45 % NaCl with KCl 20 mEq / L 50 mL/hr at 09/07/18 0504  . sodium chloride 1,000 mL (09/04/18 1310)  . chlorproMAZINE (THORAZINE) IV 25 mg (09/05/18 1555)     LOS: 3 days     Kayleen Memos, MD [facility] Pager [phone]  If 7PM-7AM, please contact night-coverage [email or web address] Password [facility] 09/07/2018, 10:18 AM

## 2018-09-07 NOTE — Progress Notes (Signed)
[facility]) hospital liaison note.  Received request from [place], [place] for family interest in [facility]. Chart reviewed and spoke with family to acknowledge referral.  Unfortunately [facility] is not able to offer a room today. Family and CSW are aware [facility] liaison will follow up with CSW and family tomorrow or sooner if room becomes available. Please do not hesitate to call with questions.  Thank you,  Farrel Gordon, RN, [facility] Liaison  [place] are on AMION

## 2018-09-07 NOTE — Progress Notes (Signed)
Patient ID: Scott Gallagher, male   DOB: May 04, 1971, 48 y.o.   MRN: [id number]  This NP visited patient at the bedside as a follow up to  yesterday's [place] meeting for continued conversation regarding discussion regarding current medical situation and goals of care,  end-of-life wishes, disposition and options.      Wife and patient's mother at bedside.  Wife understands the seriousness of the patient's current medical situation and limited prognosis.  Hope is for residential hospice facility for EOL care.   Plan of Care: Focus of care is comfort, quality  and dignity  - DNR/DNI - no further treatment for cancer diagnosis -   no further, diagnostics,  antibiotic use - symptom management to enhance comfort- see orders/ heavy symptom burden   - place NG tube for nausea/vomiting- nursing reports large amounts of bilious material, patient has has         releif - hopeful for residential hospice for EOL care - prognosis is likely less than two weeks  Discussed  natural trajectory and expectations at end of life.  Questions and concerns addressed     Discussed with Dr [place] Crane  Total time spent on the unit was 45 minutes  Greater than 50% of the time was spent in counseling and coordination of care  Wadie Lessen NP  Palliative Medicine Team Team Phone # [phone] Pager [phone]

## 2018-09-07 NOTE — Progress Notes (Signed)
Pt has been referred to [facility] for residential hospice care at [facility]. No current bed availability but liaison following.  Sharren Bridge, MSW, LCSW Clinical Social Work 09/07/2018 [phone]

## 2018-09-08 NOTE — Discharge Summary (Signed)
Discharge Summary  Scott Gallagher NIO:[id number] DOB: [birth year]  PCP: [name], MD  Admit date: 09/04/2018 Discharge date: 09/08/2018  Time spent: 35 minutes  Recommendations for Outpatient Follow-up:  1. Follow-up with hospice care 2. Pain management and anxiety management per hospice care  Discharge Diagnoses:  Active Hospital Problems   Diagnosis Date Noted  . Pneumonia 09/04/2018  . Palliative care by specialist   . DNR (do not resuscitate)   . Cancer associated pain   . Volume depletion 09/04/2018  . Hypokalemia 09/04/2018  . Nausea and vomiting 09/04/2018  . Physical deconditioning 08/18/2018  . Nausea & vomiting 08/18/2018  . Counseling regarding advance care planning and goals of care 05/27/2017  . Metastatic colon cancer to liver ([place]) 07/01/2016  . Diabetes mellitus type 2, diet-controlled ([place]) 05/22/2016  . OSA (obstructive sleep apnea) 05/22/2016  . Hyperlipidemia 05/22/2016    Resolved Hospital Problems  No resolved problems to display.    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 No hematemesis or melena. Denies any abdominal pain. He has been having constant nausea and hiccups with multiple episodes of vomiting.Patient has abdominal discomfort largely from ascites. Had 2 previous paracentesis. There was some discussion regarding peritoneal catheter for long-term relief of ascites. Has not been able to lie down largely because of ascites. In the ED, noted to be mildly  hypoxic. Chest x-ray done in the ED showed new infiltrate on the left lung. Had mild leukocytosis as well. Patient was then considered for admission to the hospital for persistent nausea, vomiting with possible left sided pneumonia.  09/07/2018: Palliative care team following.  Patient's wife made decision for full comfort care only.  Plan is to transfer to residential hospice.   09/08/2018: Patient seen and examined at his bedside.  No acute events overnight.  His pain is well controlled on current regimen.  Denies any pain this morning.  Appears chronically ill but comfortable.  He has no new complaints.  On the day of discharge, the patient was stable for transportation.  He will be transferred to residential hospice and Dr. [name] will assume care per patient's request.  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    Impaired glucose tolerance Last hemoglobin A1c 5.7 Diet controlled Avoid  hypoglycemia Comfort feedings  Physical debility in the setting of metastasized stage IV colon cancer Poor prognosis Full comfort care Ambulate as tolerated with fall precautions  Presumed community-acquired pneumonia Presented with leukocytosis and tachycardia with chest x-ray suspicion for left lingular infiltrate versus atelectasis Independently reviewed chest x-ray Received 4 days of IV antibiotics empirically IV antibiotics stopped on 09/07/2018 Afebrile Wife declines further work-up or testing Moving towards full comfort care.    Recent hypokalemia Last potassium 3.3 on 09/06/2018 No further work-up per wife, moving towards comfort care only  Goals of care Poor prognosis due to advanced metastatic colon cancer to the liver, recurrent ascites, poor functional status.  Patient's family wife and mother in the room made decision to transition to comfort care only.  We will transfer to hospice and palliative care of [facility].  Dr. [name] to assume care per patient's request.    Code Status:DNR   Consultants:  Palliative Care  Procedures:  US guided paracentesis 09/05/18     Discharge Exam: BP 126/78 (BP Location: Right Arm)   Pulse (!) 132   Temp 98.3 F (36.8 C) (Oral)   Resp 16   Ht 6\' 2"  (1.88 m)   Wt 91.3 kg   SpO2 94%   BMI 25.84 kg/m  . General: 48 y.o. year-old male well developed well nourished in no acute distress.  Alert and oriented x3. . Cardiovascular: Regular rate and rhythm with no rubs or gallops.  No thyromegaly or JVD noted.   [name] Respiratory: Clear to auscultation with no wheezes or rales. Good inspiratory effort. . Abdomen: Soft nontender nondistended with normal bowel sounds x4 quadrants. . Musculoskeletal: No lower extremity edema. 2/4 pulses in all 4 extremities. . Skin: No ulcerative lesions noted or rashes, . Psychiatry: Mood is appropriate for condition and setting  Discharge Instructions You were cared for by a  hospitalist during your hospital stay. If you have any questions about your discharge medications or the care you received while you were in the hospital after you are discharged, you can call the unit and asked to speak with the hospitalist on call if the hospitalist that took care of you is not available. Once you are discharged, your primary care physician will handle any further medical issues. Please note that NO REFILLS for any discharge medications will be authorized once you are discharged, as it is imperative that you return to your primary care physician (or establish a relationship with a primary care physician if you do not have one) for your aftercare needs so that they can reassess your need for medications and monitor your lab values.   Allergies as of 09/08/2018      Reactions   Penicillins    Was told as a child he was allergic does not know of type of reaction.  Has patient had a PCN reaction causing immediate rash, facial/tongue/throat swelling, SOB or lightheadedness with hypotension: N Has patient had a PCN reaction causing severe rash involving mucus membranes or skin necrosis: N Has patient had a PCN reaction that required hospitalization: N Has patient had a PCN reaction occurring within the last 10 years: N If all of the above answers are "NO", then may proceed with Cephalospo      Medication List    STOP  taking these medications   Albuterol Sulfate 108 (90 Base) MCG/ACT Aepb Commonly known as:  PROAIR RESPICLICK   dronabinol 5 MG capsule Commonly known as:  MARINOL   feeding supplement (ENSURE ENLIVE) Liqd   fentaNYL 75 MCG/HR Commonly known as:  DURAGESIC   loperamide 2 MG tablet Commonly known as:  IMODIUM A-D   LORazepam 0.5 MG tablet Commonly known as:  ATIVAN   multivitamin with minerals Tabs tablet   ondansetron 4 MG disintegrating tablet Commonly known as:  ZOFRAN ODT   ondansetron 8 MG tablet Commonly known as:  ZOFRAN   Oxycodone HCl 10 MG  Tabs   polyethylene glycol packet Commonly known as:  MIRALAX   prochlorperazine 10 MG tablet Commonly known as:  COMPAZINE   promethazine 25 MG tablet Commonly known as:  PHENERGAN   senna-docusate 8.6-50 MG tablet Commonly known as:  SENNA S      Allergies  Allergen Reactions  . Penicillins     Was told as a child he was allergic does not know of type of reaction.   Has patient had a PCN reaction causing immediate rash, facial/tongue/throat swelling, SOB or lightheadedness with hypotension: N Has patient had a PCN reaction causing severe rash involving mucus membranes or skin necrosis: N Has patient had a PCN reaction that required hospitalization: N Has patient had a PCN reaction occurring within the last 10 years: N If all of the above answers are "NO", then may proceed with Cephalospo      The results of significant diagnostics from this hospitalization (including imaging, microbiology, ancillary and laboratory) are listed below for reference.    Significant Diagnostic Studies: Ct Abdomen Pelvis Wo Contrast  Result Date: 08/18/2018 CLINICAL DATA:  Acute onset of fever, nausea and vomiting. Constipation. On chemotherapy for colon cancer with liver metastases. EXAM: CT ABDOMEN AND PELVIS WITHOUT CONTRAST TECHNIQUE: Multidetector CT imaging of the abdomen and pelvis was performed following the standard protocol without IV contrast. COMPARISON:  CT of the abdomen and pelvis performed 06/12/2018 FINDINGS: Lower chest: Mild patchy airspace opacities at the right lung base raise concern for pneumonia. Mild left basilar atelectasis is noted. Scattered small nodules at the right lung base measure up to 9 mm in size. Metastatic disease cannot be excluded. The visualized portions of the mediastinum are unremarkable. Hepatobiliary: Diffuse peripheral metastatic lesions are noted throughout the liver, more prominent than in October. These are difficult to fully characterize without  contrast. High attenuation within the gallbladder may reflect residual contrast, as on the prior study. The common bile duct stent is noted in expected position. Pancreas: The pancreas is within normal limits. Spleen: The spleen is unremarkable in appearance. Adrenals/Urinary Tract: The adrenal glands are unremarkable in appearance. A small left renal cyst is noted. Nonobstructing left renal stones measure up to 6 mm in size. Nonspecific perinephric stranding is noted bilaterally. There is no evidence of hydronephrosis. No obstructing ureteral stones are identified. Stomach/Bowel: The patient is status post right-sided hemicolectomy. The ileocolic anastomosis is grossly unremarkable in appearance the remaining colon is unremarkable in appearance. The small bowel is grossly unremarkable, though fluid is seen tracking about small bowel loops. The stomach is decompressed and grossly unremarkable in appearance. Vascular/Lymphatic: Scattered calcification is seen along the abdominal aorta and its branches. The abdominal aorta is otherwise grossly unremarkable. The inferior vena cava is grossly unremarkable. No retroperitoneal lymphadenopathy is seen. No pelvic sidewall lymphadenopathy is identified. Reproductive: The bladder is mildly distended and grossly unremarkable. The prostate is  normal in size. Other: Small to moderate volume ascites is noted within the abdomen and pelvis, new from the prior study. Underlying peritoneal carcinomatosis is suspected. Musculoskeletal: No acute osseous abnormalities are identified. The visualized musculature is unremarkable in appearance. IMPRESSION: 1. Mild patchy airspace opacities at the right lung base raise concern for pneumonia. 2. Small to moderate volume ascites within the abdomen and pelvis, new from the prior study. Underlying peritoneal carcinomatosis suspected. 3. Diffuse peripheral metastatic lesions throughout the liver, more prominent than in October. These are  difficult to fully characterize without contrast. 4. Nonobstructing left renal stones measure up to 6 mm in size. Small left renal cyst. 5. Small nodules at the right lung base measure up to 9 mm in size. Metastatic disease cannot be excluded. Aortic Atherosclerosis (ICD10-I70.0). Electronically Signed   By: Garald Balding M.D.   On: 08/18/2018 05:33   Dg Chest 2 View  Result Date: 09/04/2018 CLINICAL DATA:  Colon carcinoma.  Vomiting. EXAM: CHEST - 2 VIEW COMPARISON:  August 23, 2018 FINDINGS: Port-A-Cath tip is in the superior vena cava. No pneumothorax. There is stable elevation of the left hemidiaphragm. There is atelectasis in the left lower lobe with suspected degree of superimposed consolidation. Right lung is clear. Heart size and pulmonary vascularity are normal. No adenopathy. No bone lesions. IMPRESSION: Atelectasis left lower lobe with suspected early pneumonia superimposed in this area. Lungs elsewhere clear. Cardiac silhouette within normal limits. No adenopathy. Stable elevation left hemidiaphragm. Port-A-Cath tip in superior vena cava. Electronically Signed   By: Lowella Grip III M.D.   On: 09/04/2018 10:53   Dg Chest 2 View  Result Date: 08/23/2018 CLINICAL DATA:  Pneumonia EXAM: CHEST - 2 VIEW COMPARISON:  08/17/2018, 12/23/2017 FINDINGS: Right-sided central venous port tip over the SVC. No acute consolidation. Trace pleural effusion. Normal heart size. No pneumothorax. IMPRESSION: Trace pleural effusions.  No focal airspace disease. Electronically Signed   By: Donavan Foil M.D.   On: 08/23/2018 23:37   Dg Chest 2 View  Result Date: 08/17/2018 CLINICAL DATA:  Colon cancer. Fever. EXAM: CHEST - 2 VIEW COMPARISON:  12/23/2017 chest radiograph. FINDINGS: Right internal jugular Port-A-Cath terminates in middle third of the SVC. Stable cardiomediastinal silhouette with normal heart size. No pneumothorax. No pleural effusion. Mild elevation of the left hemidiaphragm. No pulmonary edema.  Hazy posterior lung base opacity on the lateral view, favor basilar right lower lobe opacity. IMPRESSION: Hazy posterior lung base opacity on the lateral view, favor basilar right lower lobe opacity, which could represent atelectasis or pneumonia. Follow-up chest radiographs advised. Electronically Signed   By: Ilona Sorrel M.D.   On: 08/17/2018 22:50   Ct Abdomen Pelvis W Contrast  Result Date: 08/29/2018 CLINICAL DATA:  Stage IV ascending colon cancer diagnosed 2017 with liver metastases. Ongoing chemotherapy. Patient presents with generalized abdominal pain and distention, nausea and vomiting. Status post paracentesis 08/20/2018. EXAM: CT ABDOMEN AND PELVIS WITH CONTRAST TECHNIQUE: Multidetector CT imaging of the abdomen and pelvis was performed using the standard protocol following bolus administration of intravenous contrast. CONTRAST:  185mL ISOVUE-300 IOPAMIDOL (ISOVUE-300) INJECTION 61% COMPARISON:  08/18/2018 CT abdomen/pelvis. FINDINGS: Lower chest: Small dependent bilateral pleural effusions, increased bilaterally since 08/18/2018 CT. Mild-to-moderate compressive atelectasis at the dependent lung bases bilaterally. Scattered nodularity along right major fissure measuring up to 5 mm (series 4/image 23), stable. Previously described 9 mm right lower lobe pulmonary nodule on 08/18/2018 CT is obscured by atelectasis on today's scan. Hepatobiliary: Several hypodense lesions scattered throughout the  periphery of the liver, not appreciably changed since noncontrast 08/18/2018 CT, increased from 06/12/2018 CT. For example a 5.0 cm posterosuperior right liver lobe lesion (series 2/image 22), increased from 4.5 cm on 06/12/2018 CT. Lateral segment left liver lobe 1.7 cm lesion (series 2/image 35), previously 1.6 cm. Posterior inferior right liver lobe 4.8 cm confluent lesion (series 2/image 39), increased from 3.8 cm. Contracted gallbladder without interval change. CBD stent is in place with the distal tip in  the duodenal lumen. Expected pneumobilia within the nondilated intrahepatic bile ducts. Pancreas: Normal, with no mass or duct dilation. Spleen: Normal size. No mass. Adrenals/Urinary Tract: Normal adrenals. Nonobstructing 2 mm interpolar left renal stone. No hydronephrosis. Simple 1.4 cm interpolar right renal cyst. Simple 2.1 cm lower left renal cyst. Normal bladder. Stomach/Bowel: Normal non-distended stomach. Postsurgical changes from right hemicolectomy with ileocolic anastomosis in the right abdomen. No small bowel dilatation or small bowel wall thickening. No wall thickening within the remnant large-bowel. Vascular/Lymphatic: Atherosclerotic nonaneurysmal abdominal aorta. Patent portal, splenic, hepatic and renal veins. Stable mildly enlarged 1.0 cm porta hepatis node (series 2/image 36). No additional pathologically enlarged lymph nodes in the abdomen or pelvis. Reproductive: Normal size prostate. Other: No pneumoperitoneum. Diffuse peritoneal thickening and enhancement, most parent in the right pericolic gutter, probably unchanged compared to the noncontrast CT on 08/18/2018. Moderate to large volume ascites. Soft tissue caking within the left upper quadrant peritoneal fat, unchanged. Scattered mesenteric nodularity, unchanged, for example a 1.4 cm central mesenteric nodule (series 2/image 58). Musculoskeletal: Irregular hyperenhancing nodular soft tissue along midline ventral laparotomy scar measuring up to 1.6 cm (series 2/image 79), not appreciably changed. Mild thoracolumbar spondylosis. IMPRESSION: 1. 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 2. Scattered liver metastases are mildly increased from 06/12/2018 CT, unchanged from 08/18/2018 CT. 3. Stable mild porta hepatis adenopathy. 4. Small dependent bilateral  pleural effusions, increased bilaterally. Stable mild right lung base pleural nodularity. 5. Stable malignant appearing subcutaneous nodularity associated with the midline laparotomy scar in the ventral abdomen. 6. Stable well-positioned patent CBD stent. 7. No evidence of bowel obstruction or acute bowel inflammation. 8. Nonobstructing left nephrolithiasis. 9.  Aortic Atherosclerosis (ICD10-I70.0). Electronically Signed   By: Ilona Sorrel M.D.   On: 08/29/2018 14:35   US Paracentesis  Result Date: 09/05/2018 INDICATION: Patient with history of metastatic colon cancer, recurrent ascites. Request made for diagnostic and therapeutic paracentesis. EXAM: ULTRASOUND GUIDED DIAGNOSTIC AND THERAPEUTIC PARACENTESIS MEDICATIONS: None COMPLICATIONS: None immediate. PROCEDURE: Informed written consent was obtained from the patient after a discussion of the risks, benefits and alternatives to treatment. A timeout was performed prior to the initiation of the procedure. Initial ultrasound scanning demonstrates a small amount of ascites within the left mid to lower abdominal quadrant. The left mid to lower abdomen was prepped and draped in the usual sterile fashion. 1% lidocaine was used for local anesthesia. Following this, a 19 gauge, 7-cm, Yueh catheter was introduced. An ultrasound image was saved for documentation purposes. The paracentesis was performed. The catheter was removed and a dressing was applied. The patient tolerated the procedure well without immediate post procedural complication. FINDINGS: A total of approximately 1.4 liters of yellow fluid was removed. Samples were sent to the laboratory as requested by the clinical team. IMPRESSION: Successful ultrasound-guided diagnostic and therapeutic paracentesis yielding 1.4 liters of peritoneal fluid. Read by: Rowe Robert, PA-C Electronically Signed  By: Marybelle Killings M.D.   On: 09/05/2018 14:28   US Paracentesis  Result Date: 08/29/2018 INDICATION: Patient  with history of metastatic colon cancer, recurrent ascites. Request made for therapeutic paracentesis up to 5 liters. EXAM: ULTRASOUND GUIDED THERAPEUTIC PARACENTESIS MEDICATIONS: None COMPLICATIONS: None immediate. PROCEDURE: Informed written consent was obtained from the patient after a discussion of the risks, benefits and alternatives to treatment. A timeout was performed prior to the initiation of the procedure. Initial ultrasound scanning demonstrates a large amount of ascites within the left mid to lower abdominal quadrant. The left mid to lower abdomen was prepped and draped in the usual sterile fashion. 1% lidocaine was used for local anesthesia. Following this, a 6 Fr Safe-T-Centesis catheter was introduced. An ultrasound image was saved for documentation purposes. The paracentesis was performed. The catheter was removed and a dressing was applied. The patient tolerated the procedure well without immediate post procedural complication. FINDINGS: A total of approximately 5 liters of yellow fluid was removed. IMPRESSION: Successful ultrasound-guided therapeutic paracentesis yielding 5 liters of peritoneal fluid. Read by: Rowe Robert, PA-C Electronically Signed   By: Jerilynn Mages.  Shick M.D.   On: 08/29/2018 16:58   US Paracentesis  Result Date: 08/20/2018 INDICATION: Ascites EXAM: ULTRASOUND GUIDED  PARACENTESIS MEDICATIONS: None. COMPLICATIONS: None immediate. PROCEDURE: Informed written consent was obtained from the patient after a discussion of the risks, benefits and alternatives to treatment. A timeout was performed prior to the initiation of the procedure. Initial ultrasound scanning demonstrates a moderate amount of ascites within the right lower abdominal quadrant. The left lower abdomen was prepped and draped in the usual sterile fashion. 1% lidocaine with epinephrine was used for local anesthesia. Following this, a 6 Fr Safe-T-Centesis catheter was introduced. An ultrasound image was saved for  documentation purposes. The paracentesis was performed. The catheter was removed and a dressing was applied. The patient tolerated the procedure well without immediate post procedural complication. FINDINGS: A total of approximately 3.6 L of clear yellow fluid was removed. Samples were sent to the laboratory as requested by the clinical team. IMPRESSION: Successful ultrasound-guided paracentesis yielding 3.6 liters of peritoneal fluid. Electronically Signed   By: Marybelle Killings M.D.   On: 08/20/2018 14:02    Microbiology: Recent Results (from the past 240 hour(s))  Body fluid culture     Status: None   Collection Time: 08/29/18  2:57 PM  Result Value Ref Range Status   Specimen Description   Final    PERITONEAL CAVITY Performed at [facility] [address], [place], [place] [address]    Special Requests   Final    Immunocompromised Performed at [facility], [place] [address], [place], [place] [address]    Gram Stain   Final    RARE WBC PRESENT, PREDOMINANTLY MONONUCLEAR NO ORGANISMS SEEN    Culture   Final    NO GROWTH 3 DAYS Performed at [facility], [place] [address], [place], [place] [address]    Report Status 09/02/2018 FINAL  Final  Culture, blood (Routine x 2)     Status: None (Preliminary result)   Collection Time: 09/04/18 11:05 AM  Result Value Ref Range Status   Specimen Description   Final    RIGHT ANTECUBITAL Performed at [facility] [address], [place], [place] [address]    Special Requests   Final    BOTTLES DRAWN AEROBIC AND ANAEROBIC Blood Culture results may not be optimal due to an excessive volume  of blood received in culture bottles Performed at [facility], [place] [address] [place], [place] [address]    Culture   Final    NO GROWTH 4 DAYS Performed at [facility], [place] [address] [place], [place] [address]    Report Status PENDING  Incomplete  Culture, blood  (Routine x 2)     Status: None (Preliminary result)   Collection Time: 09/04/18 11:16 AM  Result Value Ref Range Status   Specimen Description   Final    BLOOD RIGHT CHEST Performed at [facility] [address] [place], [place] [address]    Special Requests   Final    BOTTLES DRAWN AEROBIC AND ANAEROBIC Blood Culture adequate volume Performed at [facility] [address], [place], [place] [address]    Culture   Final    NO GROWTH 4 DAYS Performed at [facility], [place] [address], [place], [place] [address]    Report Status PENDING  Incomplete  Culture, body fluid-bottle     Status: None (Preliminary result)   Collection Time: 09/05/18  2:00 PM  Result Value Ref Range Status   Specimen Description PERITONEAL  Final   Special Requests BOTTLES DRAWN AEROBIC AND ANAEROBIC  Final   Culture   Final    NO GROWTH 3 DAYS Performed at [facility], [place] [address] [place], [place] [address]    Report Status PENDING  Incomplete  Gram stain     Status: None   Collection Time: 09/05/18  2:00 PM  Result Value Ref Range Status   Specimen Description PERITONEAL  Final   Special Requests   Final    NONE Performed at [facility], [place] [address] [place], [place] [address]    Gram Stain   Final    WBC PRESENT,BOTH PMN AND MONONUCLEAR NO ORGANISMS SEEN CYTOSPIN SMEAR    Report Status 09/06/2018 FINAL  Final     Labs: Basic Metabolic Panel: Recent Labs  Lab 09/04/18 1105 09/05/18 0445 09/06/18 0553  NA 135 137 133*  K 3.4* 3.4* 3.3*  CL 92* 93* 91*  CO2 30 29 29   GLUCOSE 142* 159* 109*  BUN 20 23* 23*  CREATININE 0.83 1.02 0.82  CALCIUM 8.9 8.7* 8.3*  MG  --  2.5*  --    Liver Function Tests: Recent Labs  Lab 09/04/18 1105 09/06/18 0553  AST 21 19  ALT 15 15  ALKPHOS 126 97  BILITOT 0.9 1.7*  PROT 6.6 6.4*  ALBUMIN 3.2* 3.4*   No results for input(s): LIPASE, AMYLASE in the last 168 hours. No results for  input(s): AMMONIA in the last 168 hours. CBC: Recent Labs  Lab 09/04/18 1105 09/05/18 0445  WBC 12.6* 8.1  NEUTROABS 11.4*  --   HGB 12.6* 11.5*  HCT 42.1 38.6*  MCV 84.0 83.7  PLT 482* 331   Cardiac Enzymes: No results for input(s): CKTOTAL, CKMB, CKMBINDEX, TROPONINI in the last 168 hours. BNP: BNP (last 3 results) No results for input(s): BNP in the last 8760 hours.  ProBNP (last 3 results) No results for input(s): PROBNP in the last 8760 hours.  CBG: Recent Labs  Lab 09/05/18 1244 09/05/18 1727 09/05/18 2140 09/06/18 0722 09/06/18 1157  GLUCAP 142* 153* 125* 119* 177*       Signed:  Kayleen Memos, MD [facility] 09/08/2018, 10:57 AM

## 2018-09-08 NOTE — Progress Notes (Signed)
Pt admitting to [facility] for residential hospice care. Arranging [facility] transportation. Family at bedside. Report #[phone]  Sharren Bridge, MSW, LCSW Clinical Social Work 09/08/2018 [phone]

## 2018-09-08 NOTE — Progress Notes (Signed)
Patient ID: Scott Gallagher, male   DOB: 10-28-1970, 48 y.o.   MRN: [id number]  This NP visited patient at the bedside as a follow up for palliative medicine needs and emotional support.  Wife and patient's mother at bedside. Patient looks more comfortable today since NG tube placement and emptying of gastric contents.    Wife understands the seriousness of the patient's current medical situation and limited prognosis.  Hope is for residential hospice facility for EOL care.   Plan of Care: Focus of care is comfort, quality  and dignity  - DNR/DNI - no further treatment for cancer diagnosis -   no further, diagnostics,  antibiotic use - symptom management to enhance comfort- see orders/ heavy symptom burden   - place NG tube for nausea/vomiting/gastric empting - hopeful for residential hospice for EOL care - prognosis is likely less than two weeks  Discussed  natural trajectory and expectations at end of life.  Emotional support offered  Questions and concerns addressed    Total time spent on the unit was 20 minutes  Greater than 50% of the time was spent in counseling and coordination of care  Wadie Lessen NP  Palliative Medicine Team Team Phone # [phone] Pager [phone]

## 2018-09-08 NOTE — Progress Notes (Signed)
At 1220pm report was called an given to [place], Therapist, sports at [facility].

## 2018-09-08 NOTE — Progress Notes (Signed)
Pt will be be transported to [facility] via Elwanda Brooklyn, RN

## 2018-09-08 NOTE — Progress Notes (Signed)
[facility]) hospital liaison note.  Registration paper work completed.. Dr. [name] to assume care per patient  request.  Please fax discharge summary to [phone]. RN please call report to [phone]. Please arrange transport for patient.  Thank you,   Farrel Gordon, RN, [facility] Liaison  [place] are on [facility]

## 2018-09-09 ENCOUNTER — Other Ambulatory Visit: Payer: Self-pay | Admitting: Physician Assistant

## 2018-09-09 ENCOUNTER — Telehealth: Payer: Self-pay | Admitting: *Deleted

## 2018-09-09 LAB — CULTURE, BLOOD (ROUTINE X 2)
CULTURE: NO GROWTH
Culture: NO GROWTH
Special Requests: ADEQUATE

## 2018-09-09 NOTE — Telephone Encounter (Signed)
Contacted spouse to ask how patient (and spouse) were doing following move yesterday to [employer]. Anderson Malta stated that patient had tolerated the move okay, was experiencing some nausea, but the NG tube was helping. She stated they were both tired. She stated that on my chart it had patient scheduled for procedure on Monday and that it needed to be cancelled. Informed her it would be cancelled. She verbalized understanding.

## 2018-09-10 LAB — CULTURE, BODY FLUID W GRAM STAIN -BOTTLE: Culture: NO GROWTH

## 2018-09-10 LAB — CULTURE, BODY FLUID-BOTTLE

## 2018-09-12 ENCOUNTER — Ambulatory Visit (HOSPITAL_COMMUNITY): Payer: 59

## 2018-09-12 ENCOUNTER — Other Ambulatory Visit (HOSPITAL_COMMUNITY): Payer: 59

## 2018-09-15 ENCOUNTER — Encounter: Payer: 59 | Admitting: Nutrition

## 2018-09-15 ENCOUNTER — Other Ambulatory Visit: Payer: 59

## 2018-09-15 ENCOUNTER — Ambulatory Visit: Payer: 59

## 2018-09-15 ENCOUNTER — Ambulatory Visit: Payer: 59 | Admitting: Hematology

## 2018-09-17 DEATH — deceased

## 2018-09-22 ENCOUNTER — Ambulatory Visit: Payer: 59 | Admitting: Hematology

## 2018-09-22 ENCOUNTER — Other Ambulatory Visit: Payer: 59

## 2018-09-22 ENCOUNTER — Ambulatory Visit: Payer: 59

## 2018-09-29 ENCOUNTER — Other Ambulatory Visit: Payer: 59

## 2018-09-29 ENCOUNTER — Ambulatory Visit: Payer: 59

## 2018-09-29 ENCOUNTER — Ambulatory Visit: Payer: 59 | Admitting: Hematology

## 2018-10-06 ENCOUNTER — Other Ambulatory Visit: Payer: 59

## 2018-10-06 ENCOUNTER — Ambulatory Visit: Payer: 59

## 2018-10-06 ENCOUNTER — Ambulatory Visit: Payer: 59 | Admitting: Hematology

## 2020-01-12 IMAGING — CT CT ABD-PELV W/ CM
2 of 5 series · 14 of 46 positions shown, 16 images · IV contrast (ISOVUE)
Comparison: 12/09/2017 and 08/23/2017

CLINICAL DATA: 47-year-old with sudden onset of right upper
quadrant pain. Elevated lipase. History of colon cancer and right
hemicolectomy. History of hepatic [AGE] radioembolization.

EXAM:
CT ABDOMEN AND PELVIS WITH CONTRAST
TECHNIQUE: Multidetector CT imaging of the abdomen and pelvis was performed
using the standard protocol following bolus administration of
intravenous contrast.
CONTRAST:  100mL P4CGRI-FHH IOPAMIDOL (P4CGRI-FHH) INJECTION 61%

[Series 2: axial st · axial · 0.87mm/px · z∈[+1042,+1518]mm · 11 of 111 slices shown, 13 images]
[im 8/111  soft-tissue]
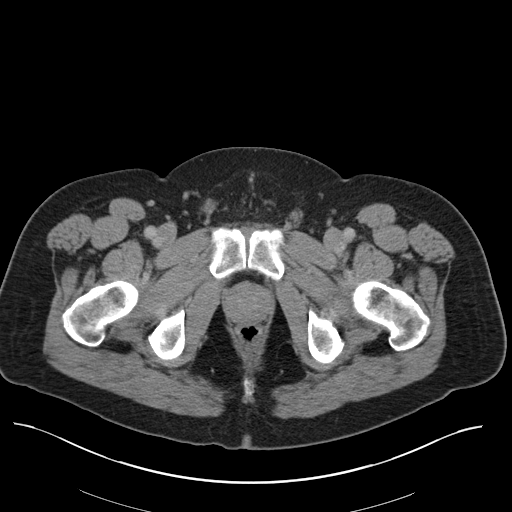
[im 8/111  bone]
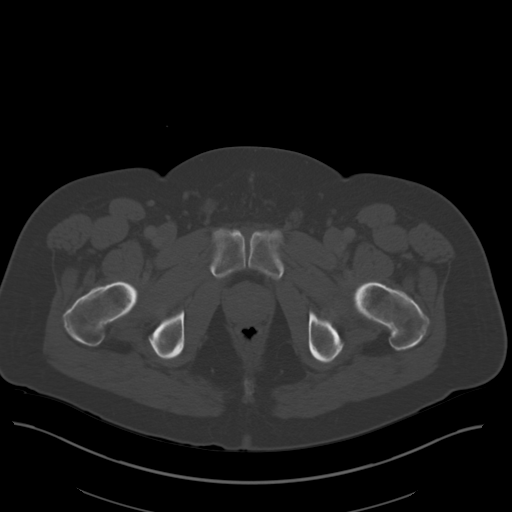
[im 15/111  soft-tissue]
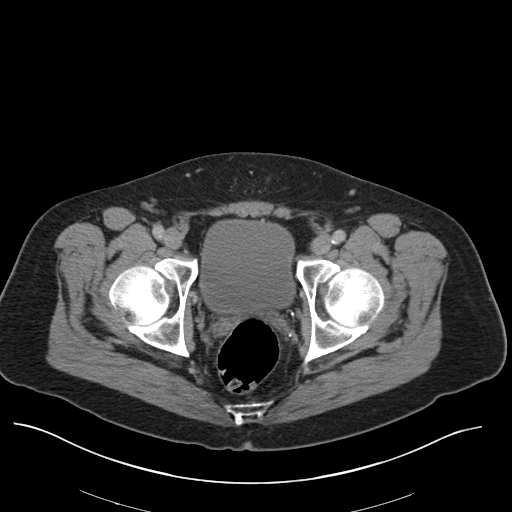
[im 30/111  soft-tissue]
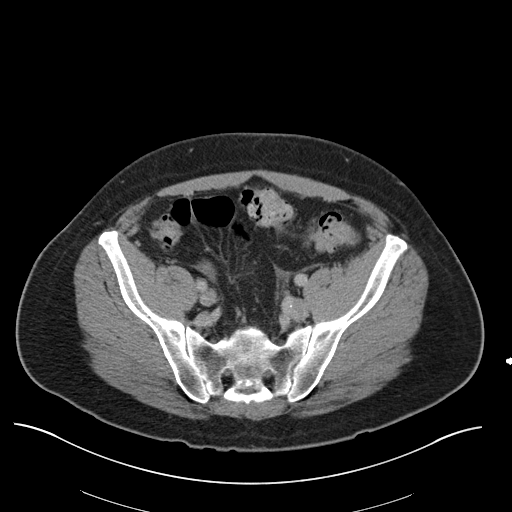
[im 37/111  soft-tissue]
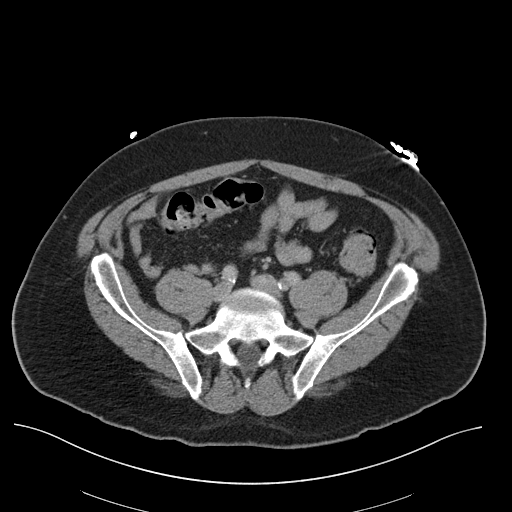
[im 45/111  soft-tissue]
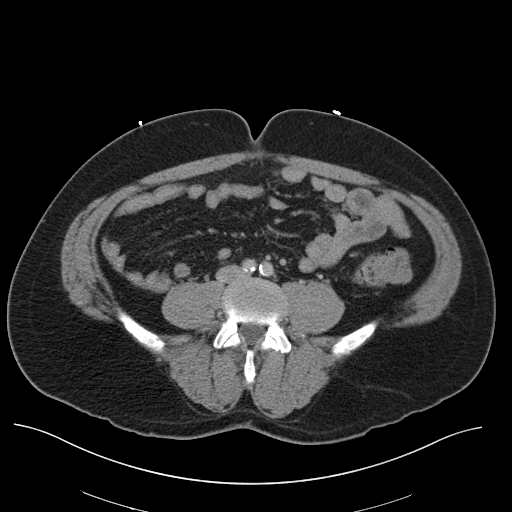
[im 59/111  soft-tissue]
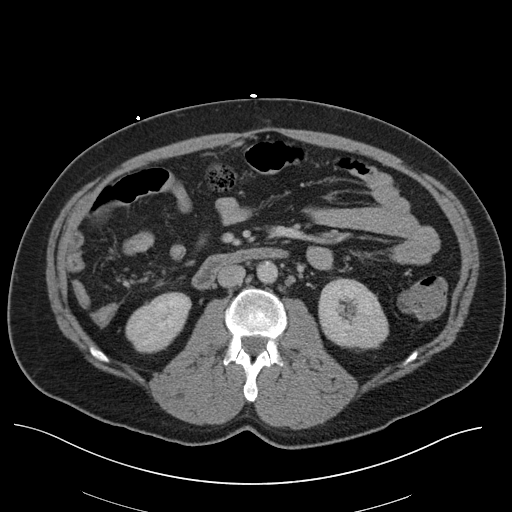
[im 67/111  soft-tissue]
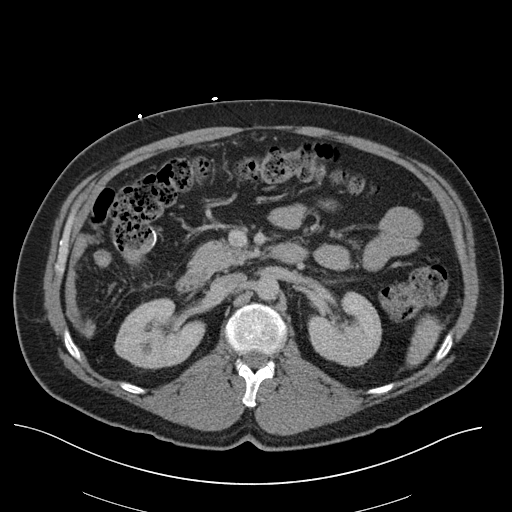
[im 74/111  soft-tissue]
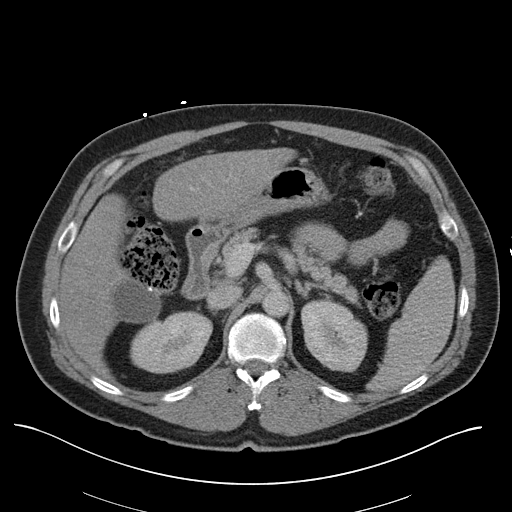
[im 81/111  soft-tissue]
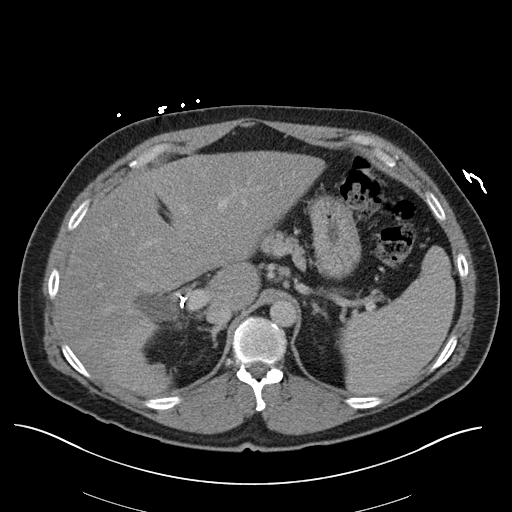
[im 81/111  bone]
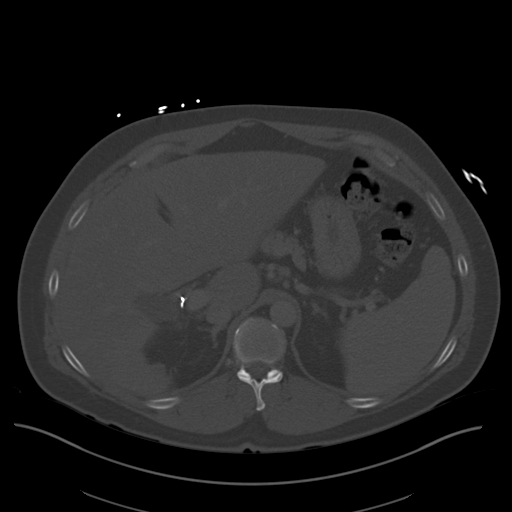
[im 96/111  soft-tissue]
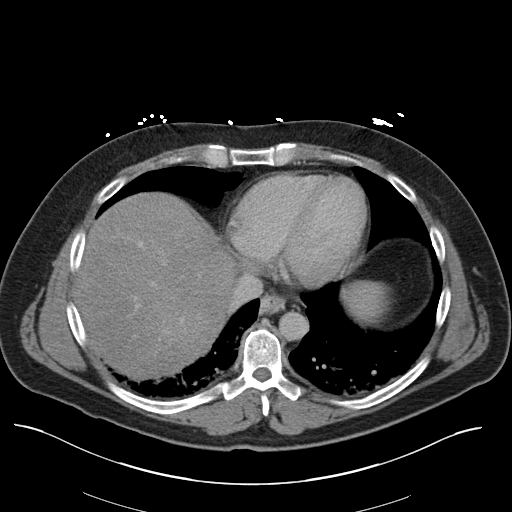
[im 103/111  soft-tissue]
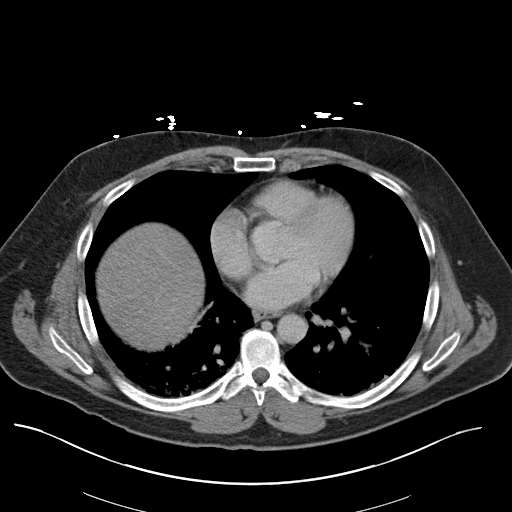

[Series 4: coronal st · coronal · 0.82mm/px · 3 of 94 slices shown]
[im 32/94  soft-tissue]
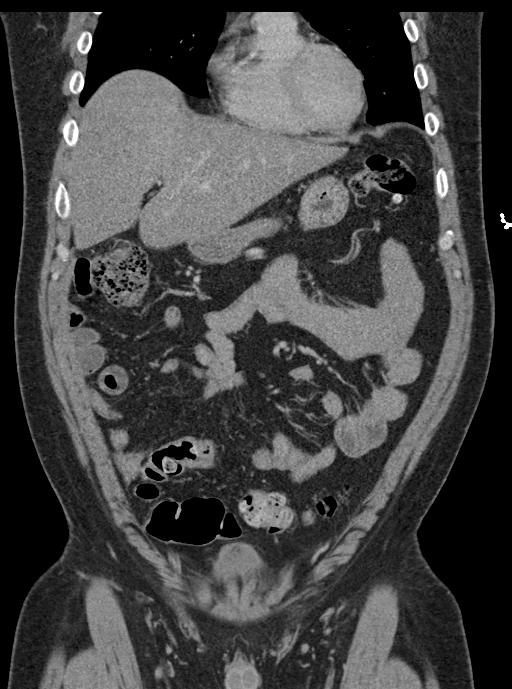
[im 42/94  soft-tissue]
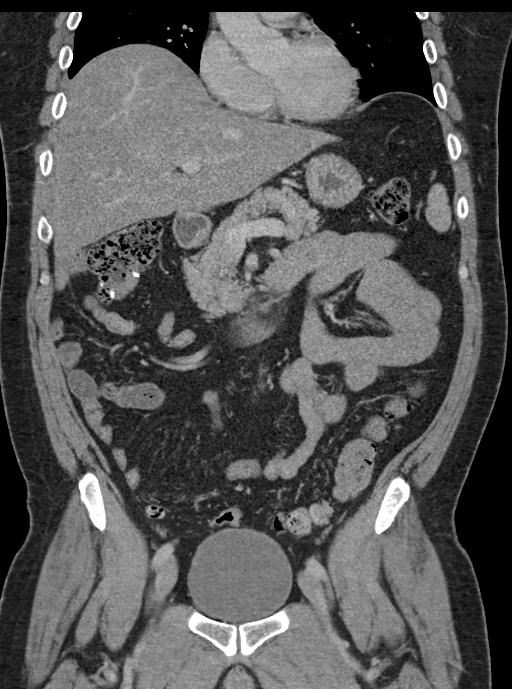
[im 52/94  soft-tissue]
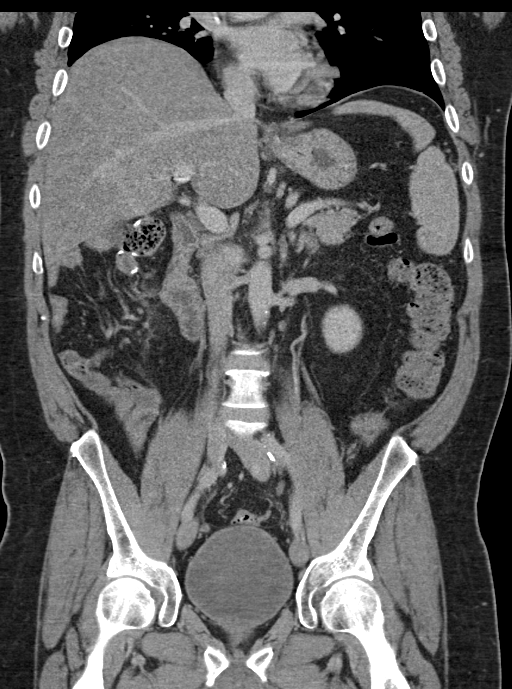

[14 of 46 positions shown; findings below may reference images not displayed]

FINDINGS: Lower chest: Port catheter tip near the superior cavoatrial
junction. Increased parenchymal densities along the dependent aspect
of both lower lobes. Again noted is a focal area of band-like
consolidation in the right lower lobe which appears to be more dense
compared to the previous examination. The lower lobe disease is more
prominent on the right than the left. No large pleural effusions.

Hepatobiliary: Again noted is heterogeneity in the liver and
findings compatible with areas of hepatic steatosis. There appears
to be some atrophy involving the posterior right hepatic lobe and
this probably is related to previous radioembolization. Again noted
are embolization coils in the right hepatic artery region. No
definitive liver lesions identified on this examination. Normal
appearance of the gallbladder. No significant biliary dilatation.

Pancreas: Normal appearance of the pancreas without inflammation or
duct dilatation.

Spleen: Normal appearance of spleen without enlargement.

Adrenals/Urinary Tract: Normal adrenal glands. Evidence for
bilateral renal cysts. No hydronephrosis. Normal urinary bladder.

Stomach/Bowel: Normal appearance of stomach and duodenum. Prior
right hemicolectomy. Moderate amount of stool in the transverse
colon. No evidence for bowel obstruction or focal bowel
inflammation.

Vascular/Lymphatic: Atherosclerotic disease in the abdominal aorta
without aneurysm. The main visceral arteries are patent. Main portal
venous system is patent. Normal appearance of the IVC and renal
veins. There is no significant lymph node enlargement in the abdomen
or pelvis.

Reproductive: Prostate is unremarkable.

Other: There is probably some soft tissue in the pelvis on sequence
2, image 89. Previously, this looked more like fluid but this is
concerning for a peritoneal drop met. Minimal fluid or edema in the
central lower abdomen on sequence 2, image 82. Probable peritoneal
nodule in the left lower anterior abdomen on sequence 2 image 79
measuring roughly 6 mm and this is new since 08/23/2017. Possible
peritoneal nodule adjacent to the descending colon on sequence 2
image 65. Irregular peritoneal nodule along the inferior aspect of
spleen adjacent to the splenic flexure of the colon. This nodule
measures roughly 1.3 cm on sequence 2, image 48 and measured roughly
1.1 cm on 08/23/2017. Peritoneal or omental nodule along the
anterior left abdomen on sequence 2, image 59 measures 1.0 cm and
this has clearly enlarged since 08/23/2017. Difficult to exclude
peritoneal disease near the anterior abdominal incision on sequence
2, image 63. Concern for subtle peritoneal disease along the
anterior aspect of the left hepatic lobe on sequence 2, image 40.

Musculoskeletal: No acute abnormality.
IMPRESSION: Increasing peripheral and parenchymal densities at the lung bases,
particularly on the right side. Some of these findings could be
related to atelectasis but also concerning for an inflammatory or
postinflammatory process.

No acute abnormalities in the abdomen or pelvis.

Again noted is evidence for peritoneal metastasis which has
progressed since 08/23/2017.

Post treatment changes in the liver with steatosis. No discrete
liver lesions are appreciated on this examination.
